# Patient Record
Sex: Female | Born: 1985 | Race: Black or African American | Hispanic: No | Marital: Single
Health system: Southern US, Community
[De-identification: ages and names within clinical notes are randomized; demographics above are authoritative.]

## PROBLEM LIST (undated history)

## (undated) ENCOUNTER — Ambulatory Visit

## (undated) DIAGNOSIS — F329 Major depressive disorder, single episode, unspecified: Secondary | ICD-10-CM

## (undated) DIAGNOSIS — A5403 Gonococcal cervicitis, unspecified: Secondary | ICD-10-CM

## (undated) DIAGNOSIS — F32A Depression, unspecified: Secondary | ICD-10-CM

## (undated) DIAGNOSIS — I10 Essential (primary) hypertension: Secondary | ICD-10-CM

## (undated) DIAGNOSIS — L309 Dermatitis, unspecified: Secondary | ICD-10-CM

## (undated) DIAGNOSIS — N3281 Overactive bladder: Secondary | ICD-10-CM

## (undated) DIAGNOSIS — R896 Abnormal cytological findings in specimens from other organs, systems and tissues: Secondary | ICD-10-CM

## (undated) DIAGNOSIS — IMO0002 Reserved for concepts with insufficient information to code with codable children: Secondary | ICD-10-CM

## (undated) DIAGNOSIS — F200 Paranoid schizophrenia: Secondary | ICD-10-CM

## (undated) DIAGNOSIS — F988 Other specified behavioral and emotional disorders with onset usually occurring in childhood and adolescence: Secondary | ICD-10-CM

## (undated) DIAGNOSIS — R87619 Unspecified abnormal cytological findings in specimens from cervix uteri: Secondary | ICD-10-CM

## (undated) DIAGNOSIS — F17200 Nicotine dependence, unspecified, uncomplicated: Secondary | ICD-10-CM

## (undated) DIAGNOSIS — J45909 Unspecified asthma, uncomplicated: Secondary | ICD-10-CM

## (undated) DIAGNOSIS — A630 Anogenital (venereal) warts: Secondary | ICD-10-CM

## (undated) DIAGNOSIS — F209 Schizophrenia, unspecified: Secondary | ICD-10-CM

## (undated) DIAGNOSIS — F202 Catatonic schizophrenia: Secondary | ICD-10-CM

## (undated) HISTORY — DX: Depression, unspecified: F32.A

## (undated) HISTORY — DX: Abnormal cytological findings in specimens from other organs, systems and tissues: R89.6

## (undated) HISTORY — DX: Catatonic schizophrenia: F20.2

## (undated) HISTORY — DX: Other specified behavioral and emotional disorders with onset usually occurring in childhood and adolescence: F98.8

## (undated) HISTORY — DX: Gonococcal cervicitis, unspecified: A54.03

## (undated) HISTORY — DX: Nicotine dependence, unspecified, uncomplicated: F17.200

## (undated) HISTORY — DX: Paranoid schizophrenia: F20.0

## (undated) HISTORY — DX: Major depressive disorder, single episode, unspecified: F32.9

## (undated) HISTORY — PX: INCISION AND DRAINAGE: SHX5863

## (undated) HISTORY — DX: Anogenital (venereal) warts: A63.0

---
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
## 2006-06-30 DIAGNOSIS — F988 Other specified behavioral and emotional disorders with onset usually occurring in childhood and adolescence: Secondary | ICD-10-CM | POA: Insufficient documentation

## 2006-06-30 DIAGNOSIS — F339 Major depressive disorder, recurrent, unspecified: Secondary | ICD-10-CM | POA: Insufficient documentation

## 2006-06-30 DIAGNOSIS — L2089 Other atopic dermatitis: Secondary | ICD-10-CM

## 2006-06-30 HISTORY — DX: Other specified behavioral and emotional disorders with onset usually occurring in childhood and adolescence: F98.8

## 2006-12-13 ENCOUNTER — Encounter: Payer: Self-pay | Admitting: Family Medicine

## 2006-12-13 DIAGNOSIS — F202 Catatonic schizophrenia: Secondary | ICD-10-CM | POA: Insufficient documentation

## 2006-12-13 HISTORY — DX: Catatonic schizophrenia: F20.2

## 2006-12-16 ENCOUNTER — Telehealth: Payer: Self-pay | Admitting: *Deleted

## 2006-12-17 ENCOUNTER — Telehealth: Payer: Self-pay | Admitting: Family Medicine

## 2006-12-23 ENCOUNTER — Encounter: Payer: Self-pay | Admitting: Family Medicine

## 2006-12-23 LAB — CONVERTED CEMR LAB
Hemoglobin: 13.9 g/dL
MCV: 82.9 fL
Platelets: 382 10*3/uL
WBC: 7.5 10*3/uL

## 2006-12-27 ENCOUNTER — Telehealth: Payer: Self-pay | Admitting: *Deleted

## 2006-12-29 ENCOUNTER — Ambulatory Visit: Payer: Self-pay | Admitting: Family Medicine

## 2007-01-09 ENCOUNTER — Ambulatory Visit: Payer: Self-pay | Admitting: Family Medicine

## 2007-01-09 DIAGNOSIS — A5403 Gonococcal cervicitis, unspecified: Secondary | ICD-10-CM | POA: Insufficient documentation

## 2007-01-09 DIAGNOSIS — N739 Female pelvic inflammatory disease, unspecified: Secondary | ICD-10-CM | POA: Insufficient documentation

## 2007-01-09 DIAGNOSIS — A5901 Trichomonal vulvovaginitis: Secondary | ICD-10-CM

## 2007-01-09 HISTORY — DX: Gonococcal cervicitis, unspecified: A54.03

## 2007-01-09 LAB — CONVERTED CEMR LAB
BUN: 10 mg/dL (ref 6–23)
CO2: 22 meq/L (ref 19–32)
Calcium: 9.5 mg/dL (ref 8.4–10.5)
Chloride: 105 meq/L (ref 96–112)
Cholesterol: 189 mg/dL (ref 0–200)
Creatinine, Ser: 0.71 mg/dL (ref 0.40–1.20)
Glucose, Bld: 88 mg/dL (ref 70–99)
HDL: 51 mg/dL (ref >39)
LDL Cholesterol: 121 mg/dL — ABNORMAL HIGH (ref 0–99)
Potassium: 4.3 meq/L (ref 3.5–5.3)
Sodium: 140 meq/L (ref 135–145)
Total CHOL/HDL Ratio: 3.7
Triglycerides: 87 mg/dL (ref <150)
VLDL: 17 mg/dL (ref 0–40)

## 2007-01-16 ENCOUNTER — Encounter: Payer: Self-pay | Admitting: Family Medicine

## 2007-01-20 ENCOUNTER — Telehealth: Payer: Self-pay | Admitting: Family Medicine

## 2008-04-02 DIAGNOSIS — E119 Type 2 diabetes mellitus without complications: Secondary | ICD-10-CM

## 2008-04-02 LAB — CONVERTED CEMR LAB: Pap Smear: NORMAL

## 2008-11-19 ENCOUNTER — Encounter: Payer: Self-pay | Admitting: Family Medicine

## 2008-11-19 DIAGNOSIS — F200 Paranoid schizophrenia: Secondary | ICD-10-CM

## 2008-11-19 HISTORY — DX: Paranoid schizophrenia: F20.0

## 2009-02-08 ENCOUNTER — Encounter (INDEPENDENT_AMBULATORY_CARE_PROVIDER_SITE_OTHER): Payer: Self-pay | Admitting: *Deleted

## 2009-02-08 DIAGNOSIS — F17200 Nicotine dependence, unspecified, uncomplicated: Secondary | ICD-10-CM

## 2009-02-08 HISTORY — DX: Nicotine dependence, unspecified, uncomplicated: F17.200

## 2009-02-10 ENCOUNTER — Ambulatory Visit: Payer: Self-pay | Admitting: Family Medicine

## 2009-02-10 DIAGNOSIS — L732 Hidradenitis suppurativa: Secondary | ICD-10-CM | POA: Insufficient documentation

## 2009-02-10 DIAGNOSIS — E669 Obesity, unspecified: Secondary | ICD-10-CM | POA: Insufficient documentation

## 2009-02-10 LAB — CONVERTED CEMR LAB
ALT: 21 units/L (ref 0–35)
AST: 20 units/L (ref 0–37)
Albumin: 4.2 g/dL (ref 3.5–5.2)
Alkaline Phosphatase: 126 units/L — ABNORMAL HIGH (ref 39–117)
BUN: 7 mg/dL (ref 6–23)
CO2: 19 meq/L (ref 19–32)
Calcium: 9.1 mg/dL (ref 8.4–10.5)
Chlamydia, Swab/Urine, PCR: NEGATIVE
Chloride: 107 meq/L (ref 96–112)
Cholesterol: 166 mg/dL (ref 0–200)
Creatinine, Ser: 0.74 mg/dL (ref 0.40–1.20)
GC Probe Amp, Urine: NEGATIVE
Glucose, Bld: 103 mg/dL — ABNORMAL HIGH (ref 70–99)
HCT: 38.3 % (ref 36.0–46.0)
HDL: 37 mg/dL — ABNORMAL LOW (ref >39)
Hemoglobin: 12.9 g/dL (ref 12.0–15.0)
LDL Cholesterol: 111 mg/dL — ABNORMAL HIGH (ref 0–99)
MCHC: 33.7 g/dL (ref 30.0–36.0)
MCV: 83.8 fL (ref 78.0–100.0)
Platelets: 414 10*3/uL — ABNORMAL HIGH (ref 150–400)
Potassium: 4.3 meq/L (ref 3.5–5.3)
RBC: 4.57 M/uL (ref 3.87–5.11)
RDW: 15.4 % (ref 11.5–15.5)
Sodium: 140 meq/L (ref 135–145)
Total Bilirubin: 0.2 mg/dL — ABNORMAL LOW (ref 0.3–1.2)
Total CHOL/HDL Ratio: 4.5
Total Protein: 7.2 g/dL (ref 6.0–8.3)
Triglycerides: 89 mg/dL (ref <150)
VLDL: 18 mg/dL (ref 0–40)
WBC: 10 10*3/uL (ref 4.0–10.5)

## 2009-02-11 ENCOUNTER — Encounter: Payer: Self-pay | Admitting: Family Medicine

## 2009-02-12 ENCOUNTER — Encounter: Payer: Self-pay | Admitting: Family Medicine

## 2009-03-03 ENCOUNTER — Encounter: Payer: Self-pay | Admitting: Family Medicine

## 2009-03-17 ENCOUNTER — Encounter: Payer: Self-pay | Admitting: Family Medicine

## 2009-03-24 ENCOUNTER — Encounter: Payer: Self-pay | Admitting: Family Medicine

## 2009-03-25 ENCOUNTER — Encounter: Payer: Self-pay | Admitting: Family Medicine

## 2009-04-09 ENCOUNTER — Encounter: Payer: Self-pay | Admitting: Family Medicine

## 2009-04-18 ENCOUNTER — Encounter: Payer: Self-pay | Admitting: Family Medicine

## 2009-05-06 ENCOUNTER — Encounter: Payer: Self-pay | Admitting: Family Medicine

## 2009-05-12 ENCOUNTER — Ambulatory Visit: Payer: Self-pay | Admitting: Family Medicine

## 2009-05-12 DIAGNOSIS — A630 Anogenital (venereal) warts: Secondary | ICD-10-CM | POA: Insufficient documentation

## 2009-05-12 HISTORY — DX: Anogenital (venereal) warts: A63.0

## 2009-05-12 LAB — CONVERTED CEMR LAB: Hgb A1c MFr Bld: 5.7 %

## 2009-06-16 ENCOUNTER — Telehealth: Payer: Self-pay | Admitting: Family Medicine

## 2009-06-18 ENCOUNTER — Telehealth: Payer: Self-pay | Admitting: Family Medicine

## 2009-07-25 ENCOUNTER — Telehealth: Payer: Self-pay | Admitting: Family Medicine

## 2009-08-14 ENCOUNTER — Encounter: Payer: Self-pay | Admitting: *Deleted

## 2009-08-14 ENCOUNTER — Telehealth: Payer: Self-pay | Admitting: *Deleted

## 2009-09-10 ENCOUNTER — Observation Stay (HOSPITAL_COMMUNITY): Admission: EM | Admit: 2009-09-10 | Discharge: 2009-09-10 | Payer: Self-pay | Admitting: Emergency Medicine

## 2010-01-02 ENCOUNTER — Emergency Department: Payer: Self-pay | Admitting: Emergency Medicine

## 2010-01-02 ENCOUNTER — Emergency Department: Payer: Self-pay | Admitting: Unknown Physician Specialty

## 2010-06-02 NOTE — Letter (Signed)
Summary: Termination of Care Letter  [facility]  [address]   [place], [place] [address]   Phone: [phone]  Fax: [phone]    08/14/2009 MRN: [id number]  Dear [name],  Due to your number of missed appointments, please be advised that the [facility] will no longer be your healthcare provider.  Accordingly, all further requests for appointments and office visits will be declined.  As such, you are not allowed on the premises of the [facility].  Should you have another physician who will take over your care, please advise [place] and we will forward a copy of your medical records.  If you do not know of another physician, we will assist you with a referral.  The termination of your relationship with this office is effective immediately.  In the next 30 days if you find that you have a need for medical care we will refer you to another provider for that care.  Thank you for your cooperation.  Sincerely,  D[place] Nancy RN  [name]   Appended Document: Termination of Care Letter Letter returned unable to forward.

## 2010-06-02 NOTE — Progress Notes (Signed)
Summary: phn msg  Phone Note Call from Patient Call back at [phone]   Caller: Patient Summary of Call: pt called to ask what time her appt is and was told 10:00.  "I thought it was at 4."  I told her no, it is at 10.  "well, I just woke up and can't make it by then.  I ride the bus. Can I resch?"  I explained that she has already rec'd a Suspension letter and she has already missed 2 appts after that and it is possible that she will be dismissed.  I would not be able to resch and she wanted me to ask the nurse if she can come later, she would get her mom to bring her.  pls advise. Initial call taken by: De Nurse,  August 14, 2009 9:49 AM  Follow-up for Phone Call        I discussed this patient with Dr. McDiarmid and the decision was made to dismiss her.  She has had 2 no shows since receiving her Suspeniosn Status letter.  Today's visit makes 3 no shows since the letter.   Letter to be mailed today. Follow-up by: [place] Nancy RN,  August 14, 2009 3:11 PM

## 2010-06-02 NOTE — Progress Notes (Signed)
Summary: TRIAGE  Phone Note Refill Request Call back at [phone] Message from:  Patient  Refills Requested: Medication #1:  LORAZEPAM 1 MG TABS 1 tablet by mouth at bedtime  Medication #2:  BENZTROPINE MESYLATE 1 MG TABS One tablet by mouth at bedtime to treat side effects of Fluphenazine PT USES [facility].  PHARMACY SAID SHE COULD NOT GET THEM BECAUSE IT WAS TOO SOON.  DOES SHE NEED TO COME IN TO SEE DR. Jessia Kief ABOUT THIS.  Initial call taken by: Clydell Hakim,  June 16, 2009 10:31 AM  Follow-up for Phone Call        to pcp Follow-up by: Golden Circle RN,  June 16, 2009 10:35 AM  Additional Follow-up for Phone Call Additional follow up Details #1::        Please let patient know she is to get her Lorazepam and Benztropine from [facility]. Additional Follow-up by: Tawanna Cooler Zophia Marrone MD,  June 16, 2009 5:05 PM    Additional Follow-up for Phone Call Additional follow up Details #2::    left message Follow-up by: Golden Circle RN,  June 17, 2009 8:52 AM  Additional Follow-up for Phone Call Additional follow up Details #3:: Details for Additional Follow-up Action Taken: spoke with her mom who told me to call [phone]. a man answered. spoke with pt who states she just tried to get them here. she has not called the [facility] center yet. advised her to call as she is out Additional Follow-up by: Golden Circle RN,  June 17, 2009 1:56 PM

## 2010-06-02 NOTE — Progress Notes (Signed)
----   Converted from flag ---- ---- 06/17/2009 4:40 PM, Wyona Almas PHD wrote: Tawanna Cooler,  Just wanted to let you know that Ms. Kopf [facility] her appt w/ me this afternoon.  Jeannie ------------------------------

## 2010-06-02 NOTE — Letter (Signed)
Summary: Suspension Letter  [facility]  [address]   [place], [place] [address]   Phone: [phone]  Fax: [phone]    05/06/2009  Molly [name] [address] [place], [place]  [address]  Dear [name],  You have missed 4 scheduled appointments with our practice.If you cannot keep your appointment, we expect you to call and cancel at least 24 hours before your appointment time.  As per our policy, we will now only give you limited medical services. means we will not call in a refill for you, or complete a form or make a referral except when you are here for a scheduled office visit.   If you miss 2 more appointments in the next year, we will dismiss you from our practice.  We hope this does not happen.  If you keep your appointments for the next year you will be returned to regular patient status.  We hope these changes will encourage you to keep your appointments so we may provide you the best medical care.   Our office staff can be reached at [phone] Monday through Friday from 8:30 a.m.-5:00 p.m. and will be glad to schedule your appointment as necessary.     Sincerely,   [facility]    Appended Document: Suspension Letter mailed.
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
## 2010-06-02 NOTE — Progress Notes (Signed)
Summary: triage  Phone Note Call from Patient Call back at [phone]   Caller: Patient Summary of Call: pt has a cyst on stomach and wants to know what she can put on it. Initial call taken by: De Nurse,  July 25, 2009 4:42 PM  Follow-up for Phone Call        lower midline area below umbilicus. it is draining pinkish . states it has been there for over a year off & on. says md has seen her for this. appt monday to be checked. until then may use warm ,wet compresses & apply triple antibiotic ointment to area. go to UC if worse over weekend. states her sisters have same Follow-up by: Golden Circle RN,  July 25, 2009 4:43 PM
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
## 2011-03-23 DIAGNOSIS — F29 Unspecified psychosis not due to a substance or known physiological condition: Secondary | ICD-10-CM | POA: Diagnosis not present

## 2011-03-23 DIAGNOSIS — F259 Schizoaffective disorder, unspecified: Secondary | ICD-10-CM | POA: Diagnosis not present

## 2011-06-06 ENCOUNTER — Emergency Department (HOSPITAL_COMMUNITY)
Admission: EM | Admit: 2011-06-06 | Discharge: 2011-06-17 | Disposition: A | Discharge status: Other Acute Inpatient Hospital | Payer: Self-pay | Attending: Family Medicine | Admitting: Family Medicine

## 2011-06-06 ENCOUNTER — Encounter (HOSPITAL_COMMUNITY): Payer: Self-pay | Admitting: *Deleted

## 2011-06-06 DIAGNOSIS — F209 Schizophrenia, unspecified: Secondary | ICD-10-CM | POA: Insufficient documentation

## 2011-06-06 HISTORY — DX: Schizophrenia, unspecified: F20.9

## 2011-06-06 LAB — RAPID URINE DRUG SCREEN, HOSP PERFORMED
Opiates: NOT DETECTED
Tetrahydrocannabinol: NOT DETECTED

## 2011-06-06 LAB — CBC
Hemoglobin: 12.4 g/dL (ref 12.0–15.0)
MCHC: 34.5 g/dL (ref 30.0–36.0)
RDW: 14.7 % (ref 11.5–15.5)

## 2011-06-06 LAB — BASIC METABOLIC PANEL
Chloride: 100 mEq/L (ref 96–112)
GFR calc Af Amer: >90 mL/min (ref >90)
Potassium: 3.5 mEq/L (ref 3.5–5.1)

## 2011-06-06 LAB — URINALYSIS, MICROSCOPIC ONLY
Nitrite: NEGATIVE
Specific Gravity, Urine: 1.004 — ABNORMAL LOW (ref 1.005–1.030)
pH: 6.5 (ref 5.0–8.0)

## 2011-06-06 LAB — DIFFERENTIAL
Basophils Absolute: 0.1 10*3/uL (ref 0.0–0.1)
Basophils Relative: 1 % (ref 0–1)
Neutro Abs: 5.5 10*3/uL (ref 1.7–7.7)
Neutrophils Relative %: 56 % (ref 43–77)

## 2011-06-06 LAB — POCT PREGNANCY, URINE: Preg Test, Ur: NEGATIVE

## 2011-06-06 MED ORDER — IBUPROFEN 600 MG PO TABS: 600.0000 mg | ORAL_TABLET | Freq: Three times a day (TID) | ORAL | Status: DC | PRN

## 2011-06-06 MED ORDER — ALUM & MAG HYDROXIDE-SIMETH 200-200-20 MG/5ML PO SUSP: 30.0000 mL | ORAL | Status: DC | PRN

## 2011-06-06 MED ORDER — ACETAMINOPHEN 325 MG PO TABS
650.0000 mg | ORAL_TABLET | ORAL | Status: DC | PRN
  Administered 2011-06-09: 650 mg via ORAL
  Filled 2011-06-06: qty 2

## 2011-06-06 MED ORDER — BENZTROPINE MESYLATE 1 MG PO TABS
2.0000 mg | ORAL_TABLET | Freq: Two times a day (BID) | ORAL | Status: DC
  Administered 2011-06-06 – 2011-06-07 (×2): 2 mg via ORAL
  Administered 2011-06-07 (×2): 1 mg via ORAL
  Administered 2011-06-08 – 2011-06-11 (×7): 2 mg via ORAL
  Administered 2011-06-12: 1 mg via ORAL
  Administered 2011-06-12 – 2011-06-17 (×10): 2 mg via ORAL
  Filled 2011-06-06 (×4): qty 2
  Filled 2011-06-06: qty 1
  Filled 2011-06-06 (×6): qty 2
  Filled 2011-06-06: qty 1
  Filled 2011-06-06 (×5): qty 2
  Filled 2011-06-06 (×2): qty 1
  Filled 2011-06-06 (×3): qty 2
  Filled 2011-06-06: qty 1

## 2011-06-06 MED ORDER — BENZTROPINE MESYLATE 1 MG/ML IJ SOLN: 2.0000 mg | Freq: Two times a day (BID) | INTRAMUSCULAR | Status: DC

## 2011-06-06 MED ORDER — FLUPHENAZINE HCL 5 MG PO TABS
5.0000 mg | ORAL_TABLET | Freq: Two times a day (BID) | ORAL | Status: DC
  Administered 2011-06-06 – 2011-06-17 (×22): 5 mg via ORAL
  Filled 2011-06-06 (×29): qty 1

## 2011-06-06 MED ORDER — DIVALPROEX SODIUM 500 MG PO DR TAB
500.0000 mg | DELAYED_RELEASE_TABLET | Freq: Two times a day (BID) | ORAL | Status: DC
  Administered 2011-06-06 – 2011-06-17 (×21): 500 mg via ORAL
  Filled 2011-06-06 (×22): qty 1

## 2011-06-06 MED ORDER — LORAZEPAM 1 MG PO TABS
1.0000 mg | ORAL_TABLET | Freq: Three times a day (TID) | ORAL | Status: DC | PRN
  Administered 2011-06-06 – 2011-06-13 (×8): 1 mg via ORAL
  Filled 2011-06-06 (×8): qty 1

## 2011-06-06 MED ORDER — NICOTINE 21 MG/24HR TD PT24
21.0000 mg | MEDICATED_PATCH | Freq: Every day | TRANSDERMAL | Status: DC
  Administered 2011-06-07 – 2011-06-11 (×4): 21 mg via TRANSDERMAL
  Filled 2011-06-06 (×6): qty 1

## 2011-06-06 MED ORDER — ONDANSETRON HCL 4 MG PO TABS: 4.0000 mg | ORAL_TABLET | Freq: Three times a day (TID) | ORAL | Status: DC | PRN

## 2011-06-06 MED ORDER — ZOLPIDEM TARTRATE 5 MG PO TABS
5.0000 mg | ORAL_TABLET | Freq: Every evening | ORAL | Status: DC | PRN
  Administered 2011-06-06 – 2011-06-16 (×7): 5 mg via ORAL
  Filled 2011-06-06 (×7): qty 1

## 2011-06-06 NOTE — ED Notes (Signed)
eatting supper 

## 2011-06-06 NOTE — ED Notes (Signed)
Security and [employer] had to physically assist pt to remove jewelry, hats, sunglasses and bra. Bandaid applied over stud in top, left lip due to inability to remove it, pt reports that it is an implant, Wille Celeste, RN aware in Psych ED. Pt escorted to Psych ED at present accompanied by [employer] and Security, condition stable at this time.

## 2011-06-06 NOTE — BH Assessment (Signed)
  Writer informed EDP of telepsych eval recommendation for inpatient treatment. Pt's RN also notified.

## 2011-06-06 NOTE — ED Notes (Signed)
Security in to wand pt and pt's personal belongings, belongings placed at nurses' station.
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
## 2011-06-06 NOTE — ED Notes (Signed)
telepsych in progress 

## 2011-06-06 NOTE — ED Notes (Signed)
MD (Dr. Preston Fleeting) at bedside.

## 2011-06-06 NOTE — ED Notes (Signed)
telepsych information faxed

## 2011-06-06 NOTE — ED Notes (Signed)
Spoke with Dr. Preston Fleeting to clarify Cogentin order that was entered as injection. MD to change order to PO.
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
## 2011-06-06 NOTE — ED Notes (Signed)
Pt assisted to BR for urine sample but became upset and argumentative when asked to change into blue scrubs and take jewelry off, [employer] present and pt instructed to change, will monitor.

## 2011-06-07 ENCOUNTER — Encounter (HOSPITAL_COMMUNITY): Payer: Self-pay | Admitting: Emergency Medicine

## 2011-06-07 NOTE — BH Assessment (Signed)
First opinion for IVC completed, signed by EDP.

## 2011-06-07 NOTE — ED Notes (Signed)
Told patient breakfast has been ordered--patient asked "who is cooking?" "Is it a man or woman?" Talked with patient regarding dietary, no further questions.
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
## 2011-06-07 NOTE — ED Notes (Signed)
Came to hallway to ask for a pregnancy test. States " when i was at the other hospital - the baby came half way out" also states she has an IUD in. Does not remember when her last period was. Wanted nurse to feel abd to feel "the baby".

## 2011-06-08 MED ORDER — ZIPRASIDONE MESYLATE 20 MG IM SOLR
20.0000 mg | Freq: Two times a day (BID) | INTRAMUSCULAR | Status: DC | PRN
  Administered 2011-06-08: 20 mg via INTRAMUSCULAR
  Filled 2011-06-08: qty 20

## 2011-06-08 NOTE — Progress Notes (Signed)
CSW spoke with Cheryl at [facility] who confirmed patient is on the waiting list.  No additional information needed at this time.  Niza Soderholm Ann S Alahia Whicker , MSW, LCSWA 06/08/2011 2:03 PM [phone]   
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
## 2011-06-08 NOTE — ED Notes (Signed)
Pt. Given lunch tray, ate a few things off tray and threw bedside tray across room with meal tray on it.  Asked pt. What happened, pt. Sat on bed with head down, nonverbal.  Informed pt. That her bedside tray would be removed from room.  Cleaned flr/walls of food, removed all from room.

## 2011-06-08 NOTE — ED Notes (Signed)
Pt was in room and removed the sheets from her bed and began tearing them into strips. When staff entered the room, pt stated that she was about to have a baby and was just going to take care of it herself since we wouldn't help her deliver it. Pt reminded that her pregnancy test was negative, and that she was not going into labor. Pt took off all of her clothes and continued to tear linen. When staff tried to redirect her, pt became hostile and tried to push the door closed. Pt tries not to allow staff to remove wet and dirty linen from room, stating she needs it. She also tries to keep all of her old cups, napkins and food items. While discussing this with pt and trying to explain why we needed to clean up the room, pt began raising her voice, stating she was not going to stay here any longer, that she wanted to be discharged now, and that we couldn't keep her here. Pt was increasingly psychotic and agitated, and required Geodon 20mg  IM per per PRN order. After security responded to unit for assistance, RN was able to convince pt to take IM shot voluntarily, after talking to her > . Pt remains hostile and psychotic, and asks that staff leaves her alone. Pt continues to be watched closely on camera to ensure her safety. Pt denies pain and is safe at this time.

## 2011-06-08 NOTE — ED Notes (Signed)
Pt. Ate dinner without throwing tray.

## 2011-06-08 NOTE — ED Notes (Signed)
Pt comes to the nurse's station repeatedly asking to leave. She states that when she got here, she was told she would only be here for a "couple of hours" and then would get to go home. Pt reminded that she is here for stabilization on meds and that that will take more time. Pt insisting on speaking to the EDP, but finally states she will wait until morning. Pt with flat affect and low tone of voice.

## 2011-06-08 NOTE — ED Notes (Signed)
Pt. Sitting in bed watching tv. 

## 2011-06-08 NOTE — ED Notes (Signed)
Patient washed but changed into old scrubs. When shown new scrubs to change into patient started laughing.

## 2011-06-08 NOTE — ED Notes (Signed)
Pt displaying unusual behavior, such as urinating in her bed, tearing her paper scrubs off and sitting naked in her bed. At times she sits with her heels together and knees pointed out, intermittently looking down at her vagina. At other times, she wets a cloth with her drinking water and places it over her vagina. When RN goes into pt room to bring replacement scrubs and new sheets, pt states she has to see the doctor because she is having contractions and needs to walk. When RN reminds her that her urine preg test was neg, pt states she doesn't believe that, and states that she knows she is pregnant and in labor. Pt states, "That's why I have to get out of here. What am I going to do if the baby pops out, leave it at home with my mama? While I'm stuck in here? NO." Pt is compliant with PO meds, although wary of taking anything staff gives her. Pt mixes her food that she has been given all together, putting her sandwich in her applesauce and pouring some milk over it. When RN offers to throw it away and get new food, pt refuses, stating she likes it that way. Pt must have all old cups removed from the room, as she was seen on the camera putting things between her legs (torn styrofoam). Pt must be constantly reassured and convinced to change her bedding and clothes, and to throw old things away. Pt is cooperative but irritated ans sometimes hostile towards staff.

## 2011-06-08 NOTE — ED Notes (Signed)
Pt. Came to hall in doorway without pants on, asked pt. To step back in room and put her pants back on, pt. Complied.  Pt. Redirectable, flat affect.

## 2011-06-08 NOTE — ED Notes (Signed)
Per pt. Request, given extra blanket. 
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
## 2011-06-09 NOTE — ED Notes (Signed)
While cleaning up patients room this writer tried to take away 2 cups of water with paper towels stuffed into them. Patient started speaking in a loud voice stating "no don't take those, they're for the babies." When asked what babies pt replied "the ones in my stomach." This writer just agreed with patient.

## 2011-06-09 NOTE — ED Notes (Signed)
Pt comes to the nurse's desk and asks writer to come talk to her in her room. Upon entering room, pt is tearful and sad. She tells Clinical research associate to take the remaining pillowcase with linens inside out of her room, stating sadly that "If I'm not gonna have a baby, then I don't need all that stuff." Pt also asked for something to drink. When writer returned with some Sprite and some crackers, pt was very mannerly and polite, saying yes ma'am, no ma'am, please and thank you.
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
## 2011-06-10 NOTE — ED Notes (Signed)
Patient refusing to shower today. Encouraged patient to let me know if she changed her mind. Offered to let her shower after dinner. Patient stated would let me know.

## 2011-06-10 NOTE — ED Notes (Signed)
Patient is resting comfortably. 
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
## 2011-06-10 NOTE — ED Notes (Signed)
While taking vitals this writer asked patient if she would like to shower now. Patient agreed.

## 2011-06-11 NOTE — ED Notes (Signed)
Patient covered herself with powder from head to toe.  Was directed to shower.  Would not explain why she did it.  Complete linen change provided.

## 2011-06-11 NOTE — ED Provider Notes (Signed)
Pt seen and evaluated in psych ED.  She is eating crackers and voices no complaints.  She has had telepsych who recommends inpatient admission.  She is awaiting placement at [facility].  Ethelda Chick, MD 06/11/11 [phone]

## 2011-06-11 NOTE — ED Notes (Signed)
Pt information faxed to [facility]. 

## 2011-06-11 NOTE — ED Notes (Addendum)
Confirmed IVC paperwork is in compliance. Expires 06/13/11. Verified that the first exam/QPE was needed and in the chart. Made copies for each set of IVC.  TC with Sheryl @ [facility]. Confirmed pt is on the wait list, nothing needed at this time.

## 2011-06-11 NOTE — Progress Notes (Signed)
[facility] 06/09/11  Facilitated BH coping skills group for pt's in [facility] ED. Group focused on how group members view themselves, others, and the world and incorporated Adlerian theory to facilitate increased social interest and healthy fx. Group was active and engaged, members collaborated to share stories of their struggles with depression/substance use and what their plan is in moving forward after d/c.  Pt was engaged in group, appeared to alternate between moments of lucidity and possible bizarre mentation. Pt's bx was difficult to distinguish between loose associations/delusional content and possible malingering (e.g., pt would state "This is [place] and [place] and if you don't want to be hear you need to move on," would stare at counselor and other group members, then laugh). Pt was able to engage in the group activity and related briefly to one other group member. Pt was mostly quiet until invited to speak.  Molly [name] B MS, LPCA, NCC

## 2011-06-11 NOTE — ED Notes (Addendum)
[facility] and spoke with [place], pt assessed for admission. Pt requires sponsorship and because she lives in [facility], she does not meet criteria for admission.
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
## 2011-06-14 NOTE — ED Notes (Signed)
Pt has been in room sleeping/watching television at intervals, singing at times, pleasant and cooperative when in her presence or communicating her needs, however difficult to engage in conversation, appears to possibly be thought blocking.

## 2011-06-14 NOTE — ED Notes (Signed)
Writer completed second IVC affidavit (7 days have passed since original IVC) and faxed to [employer]

## 2011-06-14 NOTE — Discharge Planning (Signed)
CSW spoke with Junious Dresser who confirmed that patient is on the waiting list.  No additional information needed at this time.  Ileene Hutchinson , MSW, LCSWA 06/14/2011 1:22 PM [phone]

## 2011-06-15 NOTE — ED Notes (Signed)
Up to the desk, on the phone

## 2011-06-15 NOTE — BHH Counselor (Signed)
Confirmed on [facility] list [facility]   Assessment updated today 06/15/11.
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
## 2011-06-15 NOTE — ED Notes (Signed)
Care assumed

## 2011-06-15 NOTE — Discharge Planning (Signed)
CSW spoke with Leotis Shames at [facility] who confirmed patient remains on waiting list.  No additional information needed at this time.  Ileene Hutchinson , MSW, LCSWA 06/15/2011 7:37 AM [phone]

## 2011-06-15 NOTE — ED Notes (Signed)
Pt has been in room sleeping mostly out to communicate needs/use the restroom, flat affect, quiet speech, difficult to engage in conversation only answers yes/no or shakes head when asked questions. No bizarre behaviors observed thus far. Continuing to monitor pt's behavior.

## 2011-06-16 NOTE — ED Provider Notes (Signed)
Filed Vitals:   06/16/11 0539  BP: 108/75  Pulse: 84  Temp: 98.6 F (37 C)  Resp: 18    Patient continues to await placement at [facility]. She is hemodynamically stable. No active issues at this time.  Cyndra Numbers, MD 06/16/11 [phone]
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
## 2011-06-17 DIAGNOSIS — E669 Obesity, unspecified: Secondary | ICD-10-CM | POA: Diagnosis not present

## 2011-06-17 DIAGNOSIS — F259 Schizoaffective disorder, unspecified: Secondary | ICD-10-CM | POA: Diagnosis not present

## 2011-06-17 NOTE — ED Notes (Signed)
Offered shower. Patient refusing. Offered option to shower after lunch.

## 2011-06-17 NOTE — ED Provider Notes (Signed)
Patient has been accepted at [facility] and will be transferred there.  Dione Booze, MD 06/17/11 1416

## 2011-06-17 NOTE — ED Provider Notes (Signed)
She is resting comfortably and has no complaints regarding her psychiatric condition. She is complaining that she is not able to remove the stud from her lower lip. I have evaluated her and the mucosal part of the stud has worked its way through the mucosa and is no longer accessible without making an incision. I have informed her of this.  Dione Booze, MD 06/17/11 [phone]

## 2011-06-17 NOTE — Discharge Planning (Addendum)
Connie with [facility] called to request vitals, MAR, and IVC papers. Will call back with bed assignment.  Ileene Hutchinson , MSW, LCSWA 06/17/2011 2:01 PM [phone]  Junious Dresser called and advised patient has been accepted to [facility]. Patient will be transported by [facility]. EDP and patient's nurse notified of disposition.  Ileene Hutchinson , MSW, LCSWA 06/17/2011  2:06 PM [phone]
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
## 2011-08-02 DIAGNOSIS — F200 Paranoid schizophrenia: Secondary | ICD-10-CM | POA: Diagnosis not present

## 2011-08-24 ENCOUNTER — Inpatient Hospital Stay: Payer: Self-pay | Admitting: Surgery

## 2011-08-24 DIAGNOSIS — L0501 Pilonidal cyst with abscess: Secondary | ICD-10-CM | POA: Diagnosis not present

## 2011-08-24 DIAGNOSIS — E669 Obesity, unspecified: Secondary | ICD-10-CM | POA: Diagnosis present

## 2011-08-24 DIAGNOSIS — F205 Residual schizophrenia: Secondary | ICD-10-CM | POA: Diagnosis present

## 2011-08-24 DIAGNOSIS — L0291 Cutaneous abscess, unspecified: Secondary | ICD-10-CM | POA: Diagnosis not present

## 2011-08-24 DIAGNOSIS — E119 Type 2 diabetes mellitus without complications: Secondary | ICD-10-CM | POA: Diagnosis not present

## 2011-08-24 DIAGNOSIS — F17200 Nicotine dependence, unspecified, uncomplicated: Secondary | ICD-10-CM | POA: Diagnosis present

## 2011-08-24 LAB — CBC WITH DIFFERENTIAL/PLATELET
Bands: 4 %
Basophil %: 0.2 %
Eosinophil #: 0 10*3/uL (ref 0.0–0.7)
HCT: 35.7 % (ref 35.0–47.0)
Lymphocyte #: 2.5 10*3/uL (ref 1.0–3.6)
Lymphocyte %: 11.2 %
Lymphocytes: 15 %
MCH: 30.3 pg (ref 26.0–34.0)
MCV: 91 fL (ref 80–100)
Monocyte #: 3.3 x10 3/mm — ABNORMAL HIGH (ref 0.2–0.9)
Monocyte %: 14.5 %
Monocytes: 12 %
Myelocyte: 2 %
Neutrophil #: 16.8 10*3/uL — ABNORMAL HIGH (ref 1.4–6.5)
RBC: 3.95 10*6/uL (ref 3.80–5.20)
Segmented Neutrophils: 67 %
WBC: 22.7 10*3/uL — ABNORMAL HIGH (ref 3.6–11.0)

## 2011-08-24 LAB — COMPREHENSIVE METABOLIC PANEL
Albumin: 2.9 g/dL — ABNORMAL LOW (ref 3.4–5.0)
Alkaline Phosphatase: 87 U/L (ref 50–136)
Anion Gap: 10 (ref 7–16)
BUN: 9 mg/dL (ref 7–18)
Bilirubin,Total: 0.3 mg/dL (ref 0.2–1.0)
Calcium, Total: 8.3 mg/dL — ABNORMAL LOW (ref 8.5–10.1)
Chloride: 103 mmol/L (ref 98–107)
Co2: 25 mmol/L (ref 21–32)
EGFR (African American): >60
EGFR (Non-African Amer.): >60
Glucose: 92 mg/dL (ref 65–99)
Osmolality: 274 (ref 275–301)
Potassium: 3.9 mmol/L (ref 3.5–5.1)
SGOT(AST): 32 U/L (ref 15–37)
Sodium: 138 mmol/L (ref 136–145)

## 2011-08-25 DIAGNOSIS — L0501 Pilonidal cyst with abscess: Secondary | ICD-10-CM | POA: Diagnosis not present

## 2011-08-25 LAB — BASIC METABOLIC PANEL
Anion Gap: 8 (ref 7–16)
BUN: 8 mg/dL (ref 7–18)
Calcium, Total: 7.9 mg/dL — ABNORMAL LOW (ref 8.5–10.1)
Chloride: 105 mmol/L (ref 98–107)
Co2: 25 mmol/L (ref 21–32)
Creatinine: 0.75 mg/dL (ref 0.60–1.30)
EGFR (Non-African Amer.): >60
Glucose: 107 mg/dL — ABNORMAL HIGH (ref 65–99)
Potassium: 3.6 mmol/L (ref 3.5–5.1)
Sodium: 138 mmol/L (ref 136–145)

## 2011-08-25 LAB — CBC WITH DIFFERENTIAL/PLATELET
Basophil: 1 %
Eosinophil: 0 %
HCT: 31 % — ABNORMAL LOW (ref 35.0–47.0)
HGB: 10.3 g/dL — ABNORMAL LOW (ref 12.0–16.0)
Lymphocytes: 16 %
MCH: 29.7 pg (ref 26.0–34.0)
MCV: 89 fL (ref 80–100)
Monocytes: 8 %
Segmented Neutrophils: 70 %
WBC: 20.4 10*3/uL — ABNORMAL HIGH (ref 3.6–11.0)

## 2011-09-09 DIAGNOSIS — F200 Paranoid schizophrenia: Secondary | ICD-10-CM | POA: Diagnosis not present

## 2011-10-04 DIAGNOSIS — F259 Schizoaffective disorder, unspecified: Secondary | ICD-10-CM | POA: Diagnosis not present

## 2011-10-19 DIAGNOSIS — N390 Urinary tract infection, site not specified: Secondary | ICD-10-CM | POA: Diagnosis not present

## 2011-10-19 DIAGNOSIS — Z136 Encounter for screening for cardiovascular disorders: Secondary | ICD-10-CM | POA: Diagnosis not present

## 2011-10-19 DIAGNOSIS — Z3202 Encounter for pregnancy test, result negative: Secondary | ICD-10-CM | POA: Diagnosis not present

## 2011-10-19 DIAGNOSIS — N926 Irregular menstruation, unspecified: Secondary | ICD-10-CM | POA: Diagnosis not present

## 2011-10-26 DIAGNOSIS — F259 Schizoaffective disorder, unspecified: Secondary | ICD-10-CM | POA: Diagnosis not present

## 2011-11-18 DIAGNOSIS — Z309 Encounter for contraceptive management, unspecified: Secondary | ICD-10-CM | POA: Diagnosis not present

## 2011-11-18 DIAGNOSIS — F259 Schizoaffective disorder, unspecified: Secondary | ICD-10-CM | POA: Diagnosis not present

## 2011-12-06 ENCOUNTER — Emergency Department (HOSPITAL_COMMUNITY)
Admission: EM | Admit: 2011-12-06 | Discharge: 2011-12-06 | Disposition: A | Discharge status: Home / Self Care | Payer: Self-pay | Attending: Emergency Medicine | Admitting: Emergency Medicine

## 2011-12-06 ENCOUNTER — Encounter (HOSPITAL_COMMUNITY): Payer: Self-pay | Admitting: Emergency Medicine

## 2011-12-06 DIAGNOSIS — Z008 Encounter for other general examination: Secondary | ICD-10-CM | POA: Insufficient documentation

## 2011-12-06 DIAGNOSIS — F209 Schizophrenia, unspecified: Secondary | ICD-10-CM | POA: Insufficient documentation

## 2011-12-06 NOTE — ED Notes (Signed)
Sitter at bedside.  Pt attempting to leave.  [employer] at side and stated they cannot hold pt if she is not si or hi.  Pt denies hi si.  Attempted to call group home.  Contact # is cell # for someone else.  Spoke with AD Jae Dire and she states we cannot legally hold pt.  Pt states she is calling so to pick her up.

## 2011-12-06 NOTE — ED Notes (Signed)
Pt is from a group home and was sent here because she was not acting right and appears paranoid.  Pt has history of schizophrenia and is denying seeing things or hearing voices.  Pt denies SI/HI.  Pt responds to questions but does not make eye contact.  Pt is pacing back and forth.  No history of seizures.  Will attempt to place in blue scrubs and get wanded.

## 2011-12-06 NOTE — ED Notes (Signed)
Pt not acting right.  Attempting to get patient to change into blue scrubs and notified [facility] for sitter need

## 2011-12-07 DIAGNOSIS — F259 Schizoaffective disorder, unspecified: Secondary | ICD-10-CM | POA: Diagnosis not present

## 2012-01-20 DIAGNOSIS — F259 Schizoaffective disorder, unspecified: Secondary | ICD-10-CM | POA: Diagnosis not present

## 2012-01-31 ENCOUNTER — Other Ambulatory Visit (HOSPITAL_COMMUNITY)
Admission: RE | Admit: 2012-01-31 | Discharge: 2012-01-31 | Disposition: A | Discharge status: Home / Self Care | Payer: Medicare Other | Source: Ambulatory Visit | Attending: Family Medicine | Admitting: Family Medicine

## 2012-01-31 ENCOUNTER — Encounter: Payer: Self-pay | Admitting: Family Medicine

## 2012-01-31 ENCOUNTER — Ambulatory Visit (INDEPENDENT_AMBULATORY_CARE_PROVIDER_SITE_OTHER): Payer: Medicare Other | Admitting: Family Medicine

## 2012-01-31 VITALS — BP 115/72 | HR 92 | Temp 98.5°F | Wt 273.0 lb

## 2012-01-31 DIAGNOSIS — Z1151 Encounter for screening for human papillomavirus (HPV): Secondary | ICD-10-CM | POA: Insufficient documentation

## 2012-01-31 DIAGNOSIS — B373 Candidiasis of vulva and vagina: Secondary | ICD-10-CM

## 2012-01-31 DIAGNOSIS — E119 Type 2 diabetes mellitus without complications: Secondary | ICD-10-CM

## 2012-01-31 DIAGNOSIS — A5901 Trichomonal vulvovaginitis: Secondary | ICD-10-CM

## 2012-01-31 DIAGNOSIS — R87810 Cervical high risk human papillomavirus (HPV) DNA test positive: Secondary | ICD-10-CM | POA: Insufficient documentation

## 2012-01-31 DIAGNOSIS — Z124 Encounter for screening for malignant neoplasm of cervix: Secondary | ICD-10-CM | POA: Insufficient documentation

## 2012-01-31 DIAGNOSIS — F200 Paranoid schizophrenia: Secondary | ICD-10-CM

## 2012-01-31 DIAGNOSIS — N760 Acute vaginitis: Secondary | ICD-10-CM | POA: Diagnosis not present

## 2012-01-31 DIAGNOSIS — Z113 Encounter for screening for infections with a predominantly sexual mode of transmission: Secondary | ICD-10-CM | POA: Diagnosis not present

## 2012-01-31 DIAGNOSIS — N912 Amenorrhea, unspecified: Secondary | ICD-10-CM | POA: Diagnosis not present

## 2012-01-31 DIAGNOSIS — T8339XA Other mechanical complication of intrauterine contraceptive device, initial encounter: Secondary | ICD-10-CM

## 2012-01-31 LAB — POCT URINE PREGNANCY: Preg Test, Ur: NEGATIVE

## 2012-01-31 LAB — POCT WET PREP (WET MOUNT)

## 2012-01-31 LAB — POCT GLYCOSYLATED HEMOGLOBIN (HGB A1C): Hemoglobin A1C: 5.6

## 2012-01-31 NOTE — Patient Instructions (Addendum)
Your blood sugar is good.  Your Mirena has become scarred down in your uterus. We will set up an appointment with Gynecology at [facility] to have the Mirena removed.   We will

## 2012-02-01 ENCOUNTER — Encounter: Payer: Self-pay | Admitting: Obstetrics & Gynecology

## 2012-02-01 ENCOUNTER — Encounter: Payer: Self-pay | Admitting: Family Medicine

## 2012-02-01 DIAGNOSIS — Z124 Encounter for screening for malignant neoplasm of cervix: Secondary | ICD-10-CM | POA: Insufficient documentation

## 2012-02-01 DIAGNOSIS — T8339XA Other mechanical complication of intrauterine contraceptive device, initial encounter: Secondary | ICD-10-CM | POA: Insufficient documentation

## 2012-02-01 DIAGNOSIS — B373 Candidiasis of vulva and vagina: Secondary | ICD-10-CM | POA: Insufficient documentation

## 2012-02-01 MED ORDER — METRONIDAZOLE 500 MG PO TABS: 2000.0000 mg | ORAL_TABLET | Freq: Once | ORAL | Status: DC

## 2012-02-01 MED ORDER — FLUCONAZOLE 150 MG PO TABS: 150.0000 mg | ORAL_TABLET | Freq: Once | ORAL | Status: DC

## 2012-02-01 NOTE — Assessment & Plan Note (Signed)
Pt denies sexual intercourse for over two years. Encouraged patient to inform any sexual partners within last year. Abstinence until both her and her partner treated plus 7 days.   Rx Flagyl 2 g oral once. Recheck Wet prep in 3 months to check for re-infection

## 2012-02-01 NOTE — Assessment & Plan Note (Signed)
Plan: diflucan 150 mg once oral

## 2012-02-01 NOTE — Assessment & Plan Note (Signed)
Lab Results  Component Value Date   HGBA1C 5.6 01/31/2012   Adequate glycemic control.   No new end-organ damage. No new interventions.

## 2012-02-01 NOTE — Progress Notes (Signed)
  Subjective:    Patient ID: Molly [name], female    DOB: 09-Dec-1985, 26 y.o.   MRN: [id number]  HPI  [facility] presents to re-establish care at [facility].  Pt is unaccompanied.   [name] is vague in relating recent history, but best I can discern, she has had fairly recent psychiatric hospitalization(s).  She is currently living in a half-way home in [place]. Her psychiatric medications are managed by [facility].  She reports being compliant with her prescribed psychiatric medications.  She is not in school nor working.  She is interested in returning to school to pursue a business degress.   IUD Pt requesting removal of Mirena IUD.  She believes it has been in for at least four years, but I do not have documentation of when it was placed. She is chronically amenorrheic.   She reports being not sexually active for at least 2 years.  (+) vaginal white discharge without pain or itching.  Her child is four years old.  She is not interested in replacement Mirena nor other non-barrier forms of contraception.  CHRONIC DIABETES  Disease Monitoring  Blood Sugar Ranges: not checking  Polyuria: no   Visual problems: no   Medication Compliance: On no antidiabetic medication  Medication Side Effects  Hypoglycemia: no   Preventitive Health Care  Eye Exam: none  Foot Exam: no  Diet pattern: no  Exercise: none   SH: Smoking (+).  Living in [place] home in .   Review of Systems See HPI     Objective:   Physical Exam  Nursing note and vitals reviewed. Constitutional: She is cooperative. No distress.       Morbid obesity  HENT:  Right Ear: Tympanic membrane and ear canal normal.  Left Ear: Tympanic membrane and ear canal normal.  Cardiovascular: Normal rate, regular rhythm and normal heart sounds.   Pulmonary/Chest: Effort normal and breath sounds normal.  Genitourinary: Pelvic exam was performed with patient supine. There is no rash or lesion on the  right labia. There is no rash or lesion on the left labia. Cervix exhibits no motion tenderness. Vaginal discharge found.    Neurological: She is alert.  Psychiatric: Her speech is normal and behavior is normal. Thought content normal. Her affect is blunt.       Groomed. Good Hygiene Fair Eye contact. Voice soft.          Assessment & Plan:

## 2012-02-01 NOTE — Assessment & Plan Note (Signed)
Resistance to removal of Mirena using ring forceps prevented removal of IUD. Patient agreed to consultation with GYN at [facility] for removal of device.   Referral was made. Pt resistant to any other non-barrier contraception at this time.

## 2012-02-01 NOTE — Assessment & Plan Note (Addendum)
Stable currently. Medications (Depakote, Fluphenazine, Cogentin) managed through [facility].

## 2012-02-15 ENCOUNTER — Telehealth: Payer: Self-pay | Admitting: Family Medicine

## 2012-02-15 DIAGNOSIS — IMO0001 Reserved for inherently not codable concepts without codable children: Secondary | ICD-10-CM

## 2012-02-15 HISTORY — DX: Reserved for inherently not codable concepts without codable children: IMO0001

## 2012-02-15 NOTE — Telephone Encounter (Signed)
ASC-US on 02/03/2012 pap (associated Trichomonas infection). No reflex HPV testing performed on specimen.  Patient informed that she will need repeat Pap in one year.   Pt reports taking her Diflucan and Flagyl for yeast vaginitis and trichomonas vaginitis.   Pt was able to repeat back to me that she will need to have a repeat Pap smear in one year.

## 2012-02-24 ENCOUNTER — Encounter: Payer: Self-pay | Admitting: Obstetrics & Gynecology

## 2012-02-24 ENCOUNTER — Ambulatory Visit (INDEPENDENT_AMBULATORY_CARE_PROVIDER_SITE_OTHER): Payer: Medicare Other | Admitting: Obstetrics & Gynecology

## 2012-02-24 VITALS — BP 138/90 | HR 113 | Temp 98.0°F | Ht 67.75 in | Wt 267.7 lb

## 2012-02-24 DIAGNOSIS — Z30432 Encounter for removal of intrauterine contraceptive device: Secondary | ICD-10-CM

## 2012-02-24 DIAGNOSIS — Z3009 Encounter for other general counseling and advice on contraception: Secondary | ICD-10-CM | POA: Diagnosis not present

## 2012-02-24 NOTE — Patient Instructions (Signed)
Pilonidal Cyst  A pilonidal cyst occurs when hairs get trapped (ingrown) beneath the skin in the crease between the buttocks over your sacrum (the bone under that crease). Pilonidal cysts are most common in young men with a lot of body hair. When the cyst is ruptured (breaks) or leaking, fluid from the cyst may cause burning and itching. If the cyst becomes infected, it causes a painful swelling filled with pus (abscess). The pus and trapped hairs need to be removed (often by lancing) so that the infection can heal. However, recurrence is common and an operation may be needed to remove the cyst.  HOME CARE INSTRUCTIONS    If the cyst was NOT INFECTED:   Keep the area clean and dry. Bathe or shower daily. Wash the area well with a germ-killing soap. Warm tub baths may help prevent infection and help with drainage. Dry the area well with a towel.   Avoid tight clothing to keep area as moisture free as possible.   Keep area between buttocks as free of hair as possible. A depilatory may be used.   If the cyst WAS INFECTED and needed to be drained:   Your caregiver packed the wound with gauze to keep the wound open. This allows the wound to heal from the inside outwards and continue draining.   Return for a wound check in 1 day or as suggested.   If you take tub baths or showers, repack the wound with gauze following them. Sponge baths (at the sink) are a good alternative.   If an antibiotic was ordered to fight the infection, take as directed.   Only take over-the-counter or prescription medicines for pain, discomfort, or fever as directed by your caregiver.   After the drain is removed, use sitz baths for 20 minutes 4 times per day. Clean the wound gently with mild unscented soap, pat dry, and then apply a dry dressing.  SEEK MEDICAL CARE IF:    You have increased pain, swelling, redness, drainage, or bleeding from the area.   You have a fever.   You have muscles aches, dizziness, or a general ill  feeling.  Document Released: 04/16/2000 Document Revised: 07/12/2011 Document Reviewed: 06/14/2008  [employer] Patient Information 2013 [employer].

## 2012-02-24 NOTE — Progress Notes (Signed)
Subjective:     Patient ID: Molly [name], female   DOB: 1985/11/11, 26 y.o.   MRN: [id number]  HPI Pt presents for IUD removal.  Pt reports that it is not giving her problems.  She denies pain and is not planning to have children any time soon.  She reports that 'it is due to be removed Oct of '14 so she might as well have it removed now'.  An attempt was made to remove it in her Primary care physicians ofc without success.  Pt also c/o pain in buttocks.   Review of Systems     Objective:   Physical ExamBP 138/90  Pulse 113  Temp 98 F (36.7 C) (Oral)  Ht 5' 7.75" (1.721 m)  Wt 267 lb 11.2 oz (121.428 kg)  BMI 41.00 kg/m2 GU: EGBUS: no lesions Vagina: no blood in vault Cervix: no lesion; no mucopurulent d/c; IUS strings noted and pulled without difficulty Buttocks:  Upper buttocks there is a large indurated area.         Assessment:     IUD removal.  Attempted to get a clearer understanding of why pt wanted removal but pt was adamant so the lesion was removed.      Plan:     Rec f/u with primary care to eval for possible pilonidal cyst/abscess F/u prn  Jatorian Renault L. Harraway-Smith, M.D., [place] Core

## 2012-03-03 ENCOUNTER — Ambulatory Visit: Payer: Medicare Other | Admitting: Family Medicine

## 2012-04-06 DIAGNOSIS — F259 Schizoaffective disorder, unspecified: Secondary | ICD-10-CM | POA: Diagnosis not present

## 2012-04-24 ENCOUNTER — Encounter: Payer: Self-pay | Admitting: Family Medicine

## 2012-04-24 ENCOUNTER — Ambulatory Visit (INDEPENDENT_AMBULATORY_CARE_PROVIDER_SITE_OTHER): Payer: Medicare Other | Admitting: Family Medicine

## 2012-04-24 VITALS — BP 111/72 | HR 93 | Temp 97.9°F | Ht 67.75 in | Wt 277.0 lb

## 2012-04-24 DIAGNOSIS — N912 Amenorrhea, unspecified: Secondary | ICD-10-CM

## 2012-04-24 DIAGNOSIS — E119 Type 2 diabetes mellitus without complications: Secondary | ICD-10-CM | POA: Diagnosis not present

## 2012-04-24 LAB — POCT GLYCOSYLATED HEMOGLOBIN (HGB A1C): Hemoglobin A1C: 5.7

## 2012-04-24 NOTE — Patient Instructions (Signed)
We are checking your blood sugar test (A1c) for blood sugar control.  Also checking your blood to see if you could still be pregnant even though your urine pregnancy was negative.

## 2012-04-25 ENCOUNTER — Encounter: Payer: Self-pay | Admitting: Family Medicine

## 2012-04-25 DIAGNOSIS — N912 Amenorrhea, unspecified: Secondary | ICD-10-CM | POA: Insufficient documentation

## 2012-04-25 LAB — HCG, SERUM, QUALITATIVE: Preg, Serum: NEGATIVE

## 2012-04-25 NOTE — Progress Notes (Signed)
  Subjective:    Patient ID: Molly [name], female    DOB: 1985-12-26, 26 y.o.   MRN: [id number]  HPI  Amenorrhea Pt taking Fluphenazine Recent removal of Mirena. No return of Menses with removal of Mirena. Has become sexually active with a man.  Expresses ambivalent about becoming pregnant.  States that with her prior pregnancy, she did not test positive for pregnancy using urine pregnacy tests, only with blood tests.    Review of Systems     Objective:   Physical Exam        Assessment & Plan:

## 2012-04-25 NOTE — Assessment & Plan Note (Signed)
No evidence of pregnancy on Upreg or qualitative Beta HCG, Suspect amenorrhea related to her neuroleptic medication, Fluphenazine, therapy. Pt not interested in contraception since starting a sexual relationship though seems ambivalent at prospect of pregnancy

## 2012-07-04 DIAGNOSIS — F259 Schizoaffective disorder, unspecified: Secondary | ICD-10-CM | POA: Diagnosis not present

## 2012-08-31 ENCOUNTER — Encounter: Payer: Self-pay | Admitting: Family Medicine

## 2012-08-31 ENCOUNTER — Other Ambulatory Visit (HOSPITAL_COMMUNITY)
Admission: RE | Admit: 2012-08-31 | Discharge: 2012-08-31 | Disposition: A | Discharge status: Home / Self Care | Payer: Medicare Other | Source: Ambulatory Visit | Attending: Family Medicine | Admitting: Family Medicine

## 2012-08-31 ENCOUNTER — Ambulatory Visit (INDEPENDENT_AMBULATORY_CARE_PROVIDER_SITE_OTHER): Payer: Medicare Other | Admitting: Family Medicine

## 2012-08-31 ENCOUNTER — Telehealth: Payer: Self-pay | Admitting: Family Medicine

## 2012-08-31 VITALS — BP 122/75 | HR 103 | Temp 98.2°F | Ht 67.75 in | Wt 278.0 lb

## 2012-08-31 DIAGNOSIS — R21 Rash and other nonspecific skin eruption: Secondary | ICD-10-CM | POA: Diagnosis not present

## 2012-08-31 DIAGNOSIS — Z124 Encounter for screening for malignant neoplasm of cervix: Secondary | ICD-10-CM | POA: Insufficient documentation

## 2012-08-31 DIAGNOSIS — Z1151 Encounter for screening for human papillomavirus (HPV): Secondary | ICD-10-CM | POA: Insufficient documentation

## 2012-08-31 DIAGNOSIS — B373 Candidiasis of vulva and vagina: Secondary | ICD-10-CM

## 2012-08-31 DIAGNOSIS — Z113 Encounter for screening for infections with a predominantly sexual mode of transmission: Secondary | ICD-10-CM | POA: Diagnosis not present

## 2012-08-31 DIAGNOSIS — Z7251 High risk heterosexual behavior: Secondary | ICD-10-CM

## 2012-08-31 DIAGNOSIS — R87810 Cervical high risk human papillomavirus (HPV) DNA test positive: Secondary | ICD-10-CM | POA: Insufficient documentation

## 2012-08-31 DIAGNOSIS — F17200 Nicotine dependence, unspecified, uncomplicated: Secondary | ICD-10-CM

## 2012-08-31 DIAGNOSIS — R6889 Other general symptoms and signs: Secondary | ICD-10-CM

## 2012-08-31 DIAGNOSIS — B3731 Acute candidiasis of vulva and vagina: Secondary | ICD-10-CM

## 2012-08-31 DIAGNOSIS — E119 Type 2 diabetes mellitus without complications: Secondary | ICD-10-CM | POA: Diagnosis not present

## 2012-08-31 DIAGNOSIS — A5901 Trichomonal vulvovaginitis: Secondary | ICD-10-CM

## 2012-08-31 DIAGNOSIS — Z79899 Other long term (current) drug therapy: Secondary | ICD-10-CM | POA: Diagnosis not present

## 2012-08-31 LAB — COMPREHENSIVE METABOLIC PANEL
Albumin: 4 g/dL (ref 3.5–5.2)
CO2: 21 mEq/L (ref 19–32)
Glucose, Bld: 101 mg/dL — ABNORMAL HIGH (ref 70–99)
Sodium: 135 mEq/L (ref 135–145)
Total Bilirubin: 0.2 mg/dL — ABNORMAL LOW (ref 0.3–1.2)
Total Protein: 7.2 g/dL (ref 6.0–8.3)

## 2012-08-31 LAB — CBC
HCT: 36 % (ref 36.0–46.0)
Hemoglobin: 12.2 g/dL (ref 12.0–15.0)
MCH: 29.8 pg (ref 26.0–34.0)
RBC: 4.09 MIL/uL (ref 3.87–5.11)

## 2012-08-31 LAB — POCT WET PREP (WET MOUNT): WBC, Wet Prep HPF POC: >20

## 2012-08-31 MED ORDER — PERMETHRIN 5 % EX CREA: TOPICAL_CREAM | Freq: Once | CUTANEOUS | Status: DC

## 2012-08-31 MED ORDER — METRONIDAZOLE 500 MG PO TABS: 2000.0000 mg | ORAL_TABLET | Freq: Once | ORAL | Status: DC

## 2012-08-31 MED ORDER — FLUCONAZOLE 150 MG PO TABS: 150.0000 mg | ORAL_TABLET | Freq: Once | ORAL | Status: DC

## 2012-08-31 MED ORDER — TRIAMCINOLONE ACETONIDE 0.5 % EX OINT: TOPICAL_OINTMENT | Freq: Two times a day (BID) | CUTANEOUS | Status: DC

## 2012-08-31 NOTE — Telephone Encounter (Signed)
I informed Molly [name] of her wet prep results showing yeast and trichomonas.  This is similar to wet prep findings in December 2013 office visit.   Pt says she took the flagyl and diflucan treatments in December.  A/ Trichomonas Vaginitis      Yeast Vagnitis  P/ Flagyl 2 gram oral once Diflucan 150 mg oral once.  Advised patient to notify her partner of her trichomonas infection and that she is to advise him that he needs treatment for trichomonas.  FOLLOW UP GC/Chlamydia assat on Pap smear.    Will recheck wet prep on next office visit .  Need HIV/RPR next office visit

## 2012-08-31 NOTE — Patient Instructions (Signed)
Apply the Permethrin to skin from neck to toes tonight at bedtime.  Leave it on over night, then shower the permethrin in the morning in order to kill any possible scabies.   You will continue to itch for a couple weeks after the Permethrin treatment.  Use the Triamcinolone cream twice a day to itching skin for next 10 days to help decrease the itching.  Return to the clinic if your condition is not better in 2 weeks, or if your condition worsens.   Scabies Scabies are small bugs (mites) that burrow under the skin and cause red bumps and severe itching. These bugs can only be seen with a microscope. Scabies are highly contagious. They can spread easily from person to person by direct contact. They are also spread through sharing clothing or linens that have the scabies mites living in them. It is not unusual for an entire family to become infected through shared towels, clothing, or bedding.  HOME CARE INSTRUCTIONS   Your caregiver may prescribe a cream or lotion to kill the mites. If cream is prescribed, massage the cream into the entire body from the neck to the bottom of both feet. Also massage the cream into the scalp and face if your child is less than 93 year old. Avoid the eyes and mouth. Do not wash your hands after application.  Leave the cream on for 8 to 12 hours. Your child should bathe or shower after the 8 to 12 hour application period. Sometimes it is helpful to apply the cream to your child right before bedtime.  One treatment is usually effective and will eliminate approximately 95% of infestations. For severe cases, your caregiver may decide to repeat the treatment in 1 week. Everyone in your household should be treated with one application of the cream.  New rashes or burrows should not appear within 24 to 48 hours after successful treatment. However, the itching and rash may last for 2 to 4 weeks after successful treatment. Your caregiver may prescribe a medicine to help with the  itching or to help the rash go away more quickly.  Scabies can live on clothing or linens for up to 3 days. All of your child's recently used clothing, towels, stuffed toys, and bed linens should be washed in hot water and then dried in a dryer for at least 20 minutes on high heat. Items that cannot be washed should be enclosed in a plastic bag for at least 3 days.  To help relieve itching, bathe your child in a cool bath or apply cool washcloths to the affected areas.  Your child may return to school after treatment with the prescribed cream. SEEK MEDICAL CARE IF:   The itching persists longer than 4 weeks after treatment.  The rash spreads or becomes infected. Signs of infection include red blisters or yellow-tan crust. Document Released: 04/19/2005 Document Revised: 07/12/2011 Document Reviewed: 08/28/2008 [facility] Patient Information 2013 [place], [place].

## 2012-09-01 ENCOUNTER — Encounter: Payer: Self-pay | Admitting: Family Medicine

## 2012-09-01 DIAGNOSIS — R21 Rash and other nonspecific skin eruption: Secondary | ICD-10-CM | POA: Insufficient documentation

## 2012-09-01 LAB — VALPROIC ACID LEVEL: Valproic Acid Lvl: 54.3 ug/mL (ref 50.0–100.0)

## 2012-09-01 NOTE — Progress Notes (Signed)
  Subjective:    Patient ID: Molly [name], female    DOB: [birth year], 27 y.o.   MRN: [id number]  HPI RASH  Location: thoracic back, arms, between fingers, abdomen, proximal thights Onset: 2-3 weeks ago  Course: stable Self-treated with: nothing          History Pruritis: yes, very itchy  Tenderness: no  New medications/antibiotics: no  Tick/insect/pet exposure: no, living in a group half-way home. No one else with rash to her knowledge.   Recent travel: no  New detergent, new clothing, or other topical exposure: no   Red Flags Feeling ill: no  Fever: no  Mouth lesions: no  Facial/tongue swelling/difficulty breathing:  no  Diabetic or immunocompromised: yes, diabetes    History of ASC-US pap smear about 9 months ago, and History of Trichomonas/Yeast vaginitis 01/2012. Sexually active with same partner - uncertain if mutually monogamous.  .  Reports taking the Flagyl and Diflucan prescribed 01/2012. Pt reports that partner was also treated for trichomonas infection. No discharge  Odor: no  Itching: no  Symptoms Dysuria: no  Bleeding: no  Pelvic pain: no  Genital sores: no  Rash: yes, see above  Dyspareunia: no  GI Sxs: no  Prior treatment: yes, see above   Red Flags: Missed period: irregular menses  Pregnancy: no  Recent antibiotics: no  Sexual activity: yes  Possible STD exposure: yes  IUD: no  Diabetes: yes     SH: (+) smoking cigarettes.  Review of Systems  See HPI     Objective:   Physical Exam  Constitutional: No distress.  Obese   HENT:  Mouth/Throat:    Genitourinary: Pelvic exam was performed with patient supine. There is no rash on the right labia. There is no rash on the left labia. Uterus is not enlarged and not tender. Cervix exhibits no motion tenderness, no discharge and no friability. No tenderness or bleeding around the vagina. Vaginal discharge (white curd) found.  Lymphadenopathy:    She has no cervical adenopathy.    She has no  axillary adenopathy.       Right: No inguinal and no supraclavicular adenopathy present.       Left: No inguinal and no supraclavicular adenopathy present.  Skin:             Assessment & Plan:

## 2012-09-01 NOTE — Assessment & Plan Note (Signed)
Repeat Pap smear with High-Risk HPV testing sent.

## 2012-09-01 NOTE — Assessment & Plan Note (Signed)
Recurrent. Asymptomatic. Rx Diflucan 150 mg x 1 oral.

## 2012-09-01 NOTE — Assessment & Plan Note (Signed)
Contemplative phase of change.  Gave encouragement and informed her of availability of assistance with quitting process.

## 2012-09-01 NOTE — Assessment & Plan Note (Signed)
Recurrent. Asymptomatic. Rx Flagyl 2 gram oral once.   Advised to inform partner. GC/Chlamydia testing requested with Pap smear. Will offer HIV/RPR testing next office visit.

## 2012-09-01 NOTE — Assessment & Plan Note (Addendum)
Working diagnosis is Scabies.  Differential includes : Bed bug bites, other insect bites, and atopic dermatitis.  Plan: permethrin 5% cream neck to toes overnight then shower off Topical triamcinolone for next 1 to 2 weeks.  Hesitant to use systemic corticosteroids because of patients major psychiatric complains complains of-morbidities.  Pt education on infestation eradication given.     Pt to return to clinic if not improved in two weeks or if condition worsens.

## 2012-09-01 NOTE — Assessment & Plan Note (Addendum)
Lab Results  Component Value Date   HGBA1C 5.5 08/31/2012  Adequate glycemic control.   No new end-organ damage.  No addition of medication.

## 2012-09-08 ENCOUNTER — Telehealth: Payer: Self-pay | Admitting: Family Medicine

## 2012-09-08 ENCOUNTER — Encounter: Payer: Self-pay | Admitting: Family Medicine

## 2012-09-08 ENCOUNTER — Telehealth: Payer: Self-pay | Admitting: *Deleted

## 2012-09-08 NOTE — Telephone Encounter (Signed)
Left message for pt to call back.  Please schedule with colpo clinic when she does.  Thank you, Michole Lecuyer

## 2012-09-08 NOTE — Telephone Encounter (Signed)
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            I have informed patient of abnormal Pap result.  ------

## 2012-09-08 NOTE — Telephone Encounter (Signed)
I was talking to patient about her recurrent abnormal Pap smear when the phone connection apparently failed.  I had told patient about abnormal Pap and need for colposcopic evaluation but uncertain if she heard me.  Only got voice mails when immediately attempted to call again. Left message for patient to call [facility] to discuss coming in for colposcopic evaluation at [facility] colpo clinic.

## 2012-09-08 NOTE — Assessment & Plan Note (Addendum)
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

## 2012-09-11 ENCOUNTER — Encounter: Payer: Self-pay | Admitting: Family Medicine

## 2012-09-11 NOTE — Telephone Encounter (Signed)
Sent registered letter to patient with Pap smear 08/31/12 results, an explanation of their clinical significance and asking patient to contact our office to schedule a colposcopic exam.

## 2012-09-11 NOTE — Telephone Encounter (Signed)
Left message asking patient to call about Pap smear results.

## 2012-09-14 ENCOUNTER — Telehealth: Payer: Self-pay | Admitting: *Deleted

## 2012-09-14 NOTE — Telephone Encounter (Signed)
Assisted Kerri with explaining importance of appointment with colpo clinic and what abnormal pap smears could be indicator of . Pt verbalized understanding. Lynnea Ferrier will return call to make appointment for pt to follow up with colpo clinic. Wyatt Haste, RN-BSN

## 2012-09-21 ENCOUNTER — Encounter: Payer: Self-pay | Admitting: Family Medicine

## 2012-09-21 ENCOUNTER — Ambulatory Visit (INDEPENDENT_AMBULATORY_CARE_PROVIDER_SITE_OTHER): Payer: Medicare Other | Admitting: Family Medicine

## 2012-09-21 VITALS — BP 114/72 | HR 85 | Temp 98.1°F | Ht 67.75 in | Wt 276.0 lb

## 2012-09-21 DIAGNOSIS — R6889 Other general symptoms and signs: Secondary | ICD-10-CM

## 2012-09-21 MED ORDER — TRIAMCINOLONE ACETONIDE 0.5 % EX OINT: TOPICAL_OINTMENT | Freq: Two times a day (BID) | CUTANEOUS | Status: DC

## 2012-09-21 NOTE — Progress Notes (Signed)
Patient ID: Molly [name], female   DOB: 07-Dec-1985, 27 y.o.   MRN: [id number] Patient given informed consent, signed copy in the chart.  Placed in lithotomy position. Cervix viewed with speculum and colposcope after application of acetic acid.   Colposcopy adequate (entire squamocolumnar junctions seen  in entirety) ?  yes Acetowhite lesions?no Punctation?no Mosaicism?  no Abnormal vasculature?  no Biopsies?no ECC?no Complications? none  COMMENTS: Patient was given post procedure instructions. I would recommend repeat pap in one year as colpo was cl;inically negative.

## 2012-10-03 DIAGNOSIS — F259 Schizoaffective disorder, unspecified: Secondary | ICD-10-CM | POA: Diagnosis not present

## 2012-12-01 ENCOUNTER — Ambulatory Visit: Payer: Medicare Other | Admitting: Family Medicine

## 2012-12-12 ENCOUNTER — Inpatient Hospital Stay (HOSPITAL_COMMUNITY)
Admission: AD | Admit: 2012-12-12 | Discharge: 2012-12-12 | Disposition: A | Discharge status: Home / Self Care | Payer: Medicare Other | Source: Ambulatory Visit | Attending: Obstetrics and Gynecology | Admitting: Obstetrics and Gynecology

## 2012-12-12 ENCOUNTER — Encounter (HOSPITAL_COMMUNITY): Payer: Self-pay | Admitting: *Deleted

## 2012-12-12 DIAGNOSIS — N912 Amenorrhea, unspecified: Secondary | ICD-10-CM

## 2012-12-12 DIAGNOSIS — Z3202 Encounter for pregnancy test, result negative: Secondary | ICD-10-CM | POA: Insufficient documentation

## 2012-12-12 HISTORY — DX: Reserved for concepts with insufficient information to code with codable children: IMO0002

## 2012-12-12 HISTORY — DX: Dermatitis, unspecified: L30.9

## 2012-12-12 HISTORY — DX: Unspecified abnormal cytological findings in specimens from cervix uteri: R87.619

## 2012-12-12 LAB — POCT PREGNANCY, URINE: Preg Test, Ur: NEGATIVE

## 2012-12-12 NOTE — MAU Note (Signed)
Hasn't had a period in a couple months,   Did a home test, that was negative.  Had mirena removed, this year, home period after, none since.

## 2012-12-12 NOTE — MAU Provider Note (Signed)
History     CSN: [id number]  Arrival date and time: 12/12/12 1257   First Provider Initiated Contact with Patient 12/12/12 1552      Chief Complaint  Patient presents with  . Possible Pregnancy   The history is provided by the patient.   Molly [name] 27 y.o. [id number]; presents to MAU with possible pregnancy. She had a negative pregnancy test at home however desires a confirmation pregnancy test done here in MAU. She had her IUD removed in May 2014 and now desires pregnancy. She had a normal period in May following IUD removal, however has not had a period since then. She is a patient of [name] and wishes to be seen somewhere else for care.   OB History   Grav Para Term Preterm Abortions TAB SAB Ect Mult Living   1 1 1       1       Past Medical History  Diagnosis Date  . Schizophrenia   . CERVICITIS, GONOCOCCAL, History of 01/09/2007    Qualifier: History of  By: McDiarmid MD, Tawanna Cooler    . ATTENTION DEFICIT, W/O HYPERACTIVITY, History of 06/30/2006    Qualifier: History of  By: McDiarmid MD, Tawanna Cooler    . Depression   . 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 Dennie Bible (Psych) At [facility] in  [place], [place]. Qualifier: Hospitalized for  By: McDiarmid MD, Tawanna Cooler    . 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 TOBACCO USER 02/08/2009    Qualifier: Diagnosis of  By: Knox Royalty    . 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 ASC-cannot exclude HGSIL on Pap 02/15/2012    ASC-US on 02/03/2012 pap (associated Trichomonas infection). No reflex HPV testing performed on specimen.  Patient informed that she will need repeat Pap in one year.      . Diabetes mellitus     diet controlled  . Eczema   . Abnormal Pap smear     Past Surgical History  Procedure Laterality Date  . Incision and drainage      pilanodal  cyst    Family History  Problem Relation Age of Onset  . Adopted: Yes  . Bipolar disorder Sister   . Alcohol abuse Brother   . Cancer Father   . Diabetes Mother     History  Substance Use Topics  . Smoking status: Current Every Day Smoker -- 0.50 packs/day for 10 years    Types: Cigarettes  . Smokeless tobacco: Never Used  . Alcohol Use: Yes     Comment: occ    Allergies: No Known Allergies  Prescriptions prior to admission  Medication Sig Dispense Refill  . benztropine (COGENTIN) 1 MG tablet Take 1 mg by mouth 2 (two) times daily.       . clonazePAM (KLONOPIN) 1 MG tablet Take 1 mg by mouth at bedtime.      . divalproex (DEPAKOTE) 250 MG DR tablet Take 250 mg by mouth at bedtime.      . divalproex (DEPAKOTE) 500 MG DR tablet Take 500 mg by mouth 2 (two) times daily. TAKE 2 TABLETS AT BEDTIME      . fluconazole (DIFLUCAN) 150 MG tablet Take 1 tablet (150 mg total) by mouth once.  1 tablet  0  . fluPHENAZine (  PROLIXIN) 5 MG tablet Take 15 mg by mouth 2 (two) times daily.      . metroNIDAZOLE (FLAGYL) 500 MG tablet Take 4 tablets (2,000 mg total) by mouth once.  4 tablet  0  . permethrin (ACTICIN) 5 % cream Apply topically once.  60 g  0  . triamcinolone ointment (KENALOG) 0.5 % Apply topically 2 (two) times daily. To itching skin for next 10 days.  120 g  1   Results for orders placed during the hospital encounter of 12/12/12 (from the past 24 hour(s))  POCT PREGNANCY, URINE     Status: None   Collection Time    12/12/12  1:24 PM      Result Value Range   Preg Test, Ur NEGATIVE  NEGATIVE   Review of Systems  Constitutional: Negative for fever.  Gastrointestinal: Negative for nausea, vomiting and abdominal pain.  Genitourinary: Negative.        No vaginal discharge. No vaginal bleeding. No dysuria.    Physical Exam   Blood pressure 117/74, pulse 90, temperature 98.2 F (36.8 C), temperature source Oral, resp. rate 18.  Physical Exam  Constitutional: She appears  well-developed and well-nourished. No distress.    MAU Course  Procedures  MDM Negative urine pregnancy test   Assessment and Plan  A: Secondary Amenorrhea   P: Follow up in GYN clinic, message sent/referral made.  Pregnancy risk discussed due to multiple high risk medications   RASCH, JENNIFER IRENE 12/12/2012, 3:53 PM

## 2012-12-13 ENCOUNTER — Encounter: Payer: Self-pay | Admitting: Obstetrics & Gynecology

## 2012-12-13 NOTE — MAU Provider Note (Signed)
Attestation of Attending Supervision of Advanced Practitioner (CNM/NP): Evaluation and management procedures were performed by the Advanced Practitioner under my supervision and collaboration.  I have reviewed the Advanced Practitioner's note and chart, and I agree with the management and plan.  Wanda Cellucci 12/13/2012 8:39 AM   

## 2012-12-19 DIAGNOSIS — F259 Schizoaffective disorder, unspecified: Secondary | ICD-10-CM | POA: Diagnosis not present

## 2012-12-25 ENCOUNTER — Other Ambulatory Visit (HOSPITAL_COMMUNITY)
Admission: RE | Admit: 2012-12-25 | Discharge: 2012-12-25 | Disposition: A | Discharge status: Home / Self Care | Payer: Medicare Other | Source: Ambulatory Visit | Attending: Family Medicine | Admitting: Family Medicine

## 2012-12-25 ENCOUNTER — Encounter: Payer: Self-pay | Admitting: Family Medicine

## 2012-12-25 ENCOUNTER — Ambulatory Visit (INDEPENDENT_AMBULATORY_CARE_PROVIDER_SITE_OTHER): Payer: Medicare Other | Admitting: Family Medicine

## 2012-12-25 VITALS — BP 105/75 | HR 105 | Temp 98.2°F | Wt 286.0 lb

## 2012-12-25 DIAGNOSIS — Z206 Contact with and (suspected) exposure to human immunodeficiency virus [HIV]: Secondary | ICD-10-CM

## 2012-12-25 DIAGNOSIS — Z113 Encounter for screening for infections with a predominantly sexual mode of transmission: Secondary | ICD-10-CM | POA: Diagnosis not present

## 2012-12-25 DIAGNOSIS — E119 Type 2 diabetes mellitus without complications: Secondary | ICD-10-CM | POA: Diagnosis not present

## 2012-12-25 DIAGNOSIS — N912 Amenorrhea, unspecified: Secondary | ICD-10-CM | POA: Diagnosis not present

## 2012-12-25 DIAGNOSIS — Z209 Contact with and (suspected) exposure to unspecified communicable disease: Secondary | ICD-10-CM | POA: Diagnosis not present

## 2012-12-25 DIAGNOSIS — Z20828 Contact with and (suspected) exposure to other viral communicable diseases: Secondary | ICD-10-CM

## 2012-12-25 LAB — POCT WET PREP (WET MOUNT)

## 2012-12-25 NOTE — Patient Instructions (Signed)
Your Urine pregnancy test was negative.   We are waiting on your blood pregnancy test results. We will contact you with the results of your HIV, Syphilis, Gonorrhea, and Chlamydia testing.

## 2012-12-25 NOTE — Progress Notes (Signed)
  Subjective:    Patient ID: Molly [name], female    DOB: December 05, 1985, 27 y.o.   MRN: [id number]  HPI  Amenorrhea - Pt was seen for amenorrhea and concern for pregnancy recently at [facility].  They have set her up with an apppointment at [facility] at [facility] for late September to address her amenorrhea and  Contraception Vs conception desires. - Longstanding issue for [name] since she returned to our practice as an adult. - Pt had Mirena removed this years. - She is not on any other contraception.   - Ambivalent about getting pregnant - Haydin is in a heterosexual relationship.  She believes she is not her partner's only partner. She is concerned for contracting STDs - Patient has had PID, GC and Chlamydia in the past.  - Recent Pap showed ASC-H with High-risk HPV (+).  A recent follow up Colposcopy in [facility] did not find any concerning lesions. Recommended repeat Pap in one year.      Review of Systems  Constitutional: Negative for fever and fatigue.  Cardiovascular: Negative for chest pain.  Gastrointestinal: Negative for nausea, vomiting, diarrhea and constipation.  Genitourinary: Negative for dysuria, vaginal bleeding, vaginal discharge and dyspareunia.  Musculoskeletal: Negative for joint swelling and arthralgias.  Psychiatric/Behavioral: Negative for hallucinations.       Objective:   Physical Exam  Constitutional: Vital signs are normal.  Non-toxic appearance. No distress.  Eyes: Conjunctivae are normal.  Cardiovascular: Normal rate, regular rhythm and normal heart sounds.   Pulmonary/Chest: Effort normal and breath sounds normal.  Abdominal: Soft. Bowel sounds are normal. There is no hepatosplenomegaly. There is no tenderness.  Genitourinary: No breast swelling or tenderness. There is no rash on the right labia. There is no rash on the left labia. Cervix exhibits no motion tenderness and no friability. Vaginal discharge  (white discharge, slight malodor) found.    Lymphadenopathy:       Right: No inguinal adenopathy present.       Left: No inguinal adenopathy present.          Assessment & Plan:

## 2012-12-26 ENCOUNTER — Encounter: Payer: Self-pay | Admitting: Family Medicine

## 2012-12-26 DIAGNOSIS — Z209 Contact with and (suspected) exposure to unspecified communicable disease: Secondary | ICD-10-CM | POA: Insufficient documentation

## 2012-12-26 LAB — HIV ANTIBODY (ROUTINE TESTING W REFLEX): HIV: NONREACTIVE

## 2012-12-26 LAB — HCG, SERUM, QUALITATIVE: Preg, Serum: NEGATIVE

## 2012-12-26 NOTE — Assessment & Plan Note (Signed)
Checking for HIV, RPR, cervical GC/Chlamydia.  Wet prep showed only BV.  No trichomonas.

## 2012-12-26 NOTE — Assessment & Plan Note (Signed)
No evidence of pregnancy on Upreg or qualitative Beta HCG,  Suspect amenorrhea related to her neuroleptic medication, Fluphenazine, therapy.  Pt not interested in contraception since starting a sexual relationship though seems ambivalent at prospect of pregnancy. Pt will be seen at [facility] in late September.  I encouraged Rillie to discuss her desires about conception as If she does desire it, she will need to discuss risks to herself and fetus from her current medications.

## 2012-12-28 ENCOUNTER — Ambulatory Visit: Payer: Medicare Other | Admitting: Family Medicine

## 2013-01-24 ENCOUNTER — Ambulatory Visit (INDEPENDENT_AMBULATORY_CARE_PROVIDER_SITE_OTHER): Payer: Medicare Other | Admitting: Obstetrics & Gynecology

## 2013-01-24 ENCOUNTER — Encounter: Payer: Self-pay | Admitting: Obstetrics & Gynecology

## 2013-01-24 VITALS — BP 123/92 | HR 94 | Temp 97.7°F | Ht 67.0 in | Wt 286.2 lb

## 2013-01-24 DIAGNOSIS — N912 Amenorrhea, unspecified: Secondary | ICD-10-CM | POA: Diagnosis not present

## 2013-01-24 MED ORDER — PRENATAL VITAMINS 0.8 MG PO TABS: 1.0000 | ORAL_TABLET | Freq: Every day | ORAL | Status: DC

## 2013-01-24 NOTE — Progress Notes (Signed)
Subjective:     Patient ID: Molly [name], female   DOB: 08/07/1985, 27 y.o.   MRN: [id number]  HPI Pt reports amenorrhea for 3 months.  Had IUD reomoved in May.  Has had only 1 cycle since that time.  She wants to conceive.  Her partner does not.  Has no other sx.  She is having unprotected intercourse.     Review of Systems     Objective:   Physical Exam BP 123/92  Pulse 94  Temp(Src) 97.7 F (36.5 C) (Oral)  Ht 5\' 7"  (1.702 m)  Wt 286 lb 3.2 oz (129.819 kg)  BMI 44.81 kg/m2  Exam deferred    Assessment:     Amenorrhea suspect due to progestin effect       Plan:     PNV daily F/u in 9 months or sooner prn

## 2013-01-24 NOTE — Patient Instructions (Signed)
Folic Acid in Pregnancy Folic acid is a B vitamin that helps prevent neural tube defects (NTDs). The neural tube is the part of a developing baby that becomes the brain and spinal cord. When the neural tube does not close properly, a baby is born with an NTD. NTDs include spina bifida, hernia of the spinal cord, and the absence of part of, or all of the brain (anencephaly).  Take folic acid at least 4 weeks before getting pregnant and through the first 3 months of pregnancy. This is when the neural tube is developing. It is available in most multivitamins, as a folic acid-only supplement, and in some foods. Taking the right amount of folic acid before conception and during pregnancy lessens the chances of having a baby born with an NTD. Giving folic acid will not affect a neural tube defect if it is already present. DIAGNOSIS   An Alpha-Fetoprotein (AFP) blood or amniotic fluid test will show high levels of the alpha-feto protein if a woman is carrying a baby with an NTD. This test is done on all pregnant women in the first trimester.  An ultrasound may detect an NTD. WHAT YOU CAN DO:  Take a multivitamin with at least 0.4 milligrams (400 micrograms) of folic acid daily at least 4 weeks before getting pregnant and through the first 12 weeks of pregnancy.  If you have already had a pregnancy affected by an NTD, take 4 milligrams (4,000 micrograms) of folic acid daily. Take this amount 1 month before you start trying to get pregnant and continue through the first 3 months of pregnancy.  Talk to your caregiver if you are taking medicines for seizures. Your caregiver will be able to manage your seizure medicines and your pregnancy in the best way. FOLIC ACID IN FOODS Eat a healthy diet that has foods that contain folic acid, the natural form of the vitamin. Such foods include:  Fortified breakfast cereals.  Lentils.  Asparagus.  Spinach.  Organ meats (liver).  Black beans.  Peanuts (eat  only if you do not have a peanut allergy).  Broccoli.  Strawberries, oranges.  Orange juice (from concentrate is best).  Enriched breads and pasta.  Romaine lettuce. TALK TO YOUR CAREGIVER IF:  You are in your first trimester and have high blood sugar.  You are in your first trimester and have an oral temperature above 102 F (38.9 C).  You are in your first trimester and had sauna treatments.  You are pregnant (or want to become pregnant) and take a prescription medicine called valproic acid. In almost all cases, a fetus found to have an NTD will need specialized care that may not be available in all hospitals. Talk to your caregiver about what is best for you and your baby. Document Released: 04/22/2003 Document Revised: 07/12/2011 Document Reviewed: 07/23/2009 Exit[employer] Patient Information 2014 [employer].  

## 2013-02-19 ENCOUNTER — Ambulatory Visit: Payer: Medicare Other | Admitting: Family Medicine

## 2013-02-20 ENCOUNTER — Ambulatory Visit: Payer: Medicare Other

## 2013-02-21 ENCOUNTER — Ambulatory Visit: Payer: Medicare Other

## 2013-02-28 ENCOUNTER — Ambulatory Visit: Payer: Medicare Other | Admitting: Family Medicine

## 2013-03-08 ENCOUNTER — Ambulatory Visit: Payer: Medicare Other | Admitting: Family Medicine

## 2013-07-18 ENCOUNTER — Emergency Department (HOSPITAL_COMMUNITY)
Admission: EM | Admit: 2013-07-18 | Discharge: 2013-07-18 | Disposition: A | Discharge status: Home / Self Care | Payer: Medicare Other | Attending: Emergency Medicine | Admitting: Emergency Medicine

## 2013-07-18 ENCOUNTER — Encounter (HOSPITAL_COMMUNITY): Payer: Self-pay | Admitting: Emergency Medicine

## 2013-07-18 DIAGNOSIS — F3289 Other specified depressive episodes: Secondary | ICD-10-CM | POA: Insufficient documentation

## 2013-07-18 DIAGNOSIS — Z8742 Personal history of other diseases of the female genital tract: Secondary | ICD-10-CM | POA: Diagnosis not present

## 2013-07-18 DIAGNOSIS — Z79899 Other long term (current) drug therapy: Secondary | ICD-10-CM | POA: Diagnosis not present

## 2013-07-18 DIAGNOSIS — Z872 Personal history of diseases of the skin and subcutaneous tissue: Secondary | ICD-10-CM | POA: Diagnosis not present

## 2013-07-18 DIAGNOSIS — E119 Type 2 diabetes mellitus without complications: Secondary | ICD-10-CM | POA: Diagnosis not present

## 2013-07-18 DIAGNOSIS — Z8619 Personal history of other infectious and parasitic diseases: Secondary | ICD-10-CM | POA: Diagnosis not present

## 2013-07-18 DIAGNOSIS — F17200 Nicotine dependence, unspecified, uncomplicated: Secondary | ICD-10-CM | POA: Insufficient documentation

## 2013-07-18 DIAGNOSIS — L0231 Cutaneous abscess of buttock: Secondary | ICD-10-CM | POA: Diagnosis not present

## 2013-07-18 DIAGNOSIS — L03317 Cellulitis of buttock: Secondary | ICD-10-CM | POA: Diagnosis not present

## 2013-07-18 DIAGNOSIS — F329 Major depressive disorder, single episode, unspecified: Secondary | ICD-10-CM | POA: Insufficient documentation

## 2013-07-18 DIAGNOSIS — F200 Paranoid schizophrenia: Secondary | ICD-10-CM | POA: Diagnosis not present

## 2013-07-18 MED ORDER — SULFAMETHOXAZOLE-TRIMETHOPRIM 800-160 MG PO TABS: 2.0000 | ORAL_TABLET | Freq: Two times a day (BID) | ORAL | Status: DC

## 2013-07-18 MED ORDER — HYDROCODONE-ACETAMINOPHEN 5-325 MG PO TABS
1.0000 | ORAL_TABLET | Freq: Four times a day (QID) | ORAL | Status: DC | PRN
Start: 2013-07-18 — End: 2013-08-16

## 2013-07-18 MED ORDER — HYDROMORPHONE HCL PF 1 MG/ML IJ SOLN
1.0000 mg | Freq: Once | INTRAMUSCULAR | Status: AC
  Administered 2013-07-18: 1 mg via INTRAMUSCULAR
  Filled 2013-07-18: qty 1

## 2013-07-18 NOTE — ED Provider Notes (Signed)
CSN: [id number]     Arrival date & time 07/18/13  1347 History   First MD Initiated Contact with Patient 07/18/13 1519     Chief Complaint  Patient presents with  . Abscess     (Consider location/radiation/quality/duration/timing/severity/associated sxs/prior Treatment) HPI Patient presents to the emergency department with abscess to the left medial buttocks.  Patient, states, that she has had these types of abscesses in the past.  The patient, states, that she's had had them drained under anesthesia.  Patient denies fever, nausea, vomiting, chest pain, shortness of breath, weakness, dizziness, headache, blurred vision, or syncope.  The patient, states she did not take any medications prior to arrival.  Patient, states, that palpation makes the pain, worse Past Medical History  Diagnosis Date  . Schizophrenia   . CERVICITIS, GONOCOCCAL, History of 01/09/2007    Qualifier: History of  By: McDiarmid MD, Tawanna Coolerodd    . ATTENTION DEFICIT, W/O HYPERACTIVITY, History of 06/30/2006    Qualifier: History of  By: McDiarmid MD, Tawanna Coolerodd    . Depression   . 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 Dennie Bibleichard Larsen (Psych) [facility] in  [place], [place]. Qualifier: Hospitalized for  By: McDiarmid MD, Tawanna Coolerodd    . 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 TOBACCO USER 02/08/2009    Qualifier: Diagnosis of  By: Knox Royaltydell, Erin    . 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 ASC-cannot exclude HGSIL on Pap 02/15/2012    ASC-US on 02/03/2012 pap (associated Trichomonas infection). No reflex HPV testing performed on specimen.  Patient informed that she will need repeat Pap in one year.      . Diabetes mellitus     diet controlled  . Eczema   . Abnormal Pap smear    Past Surgical History  Procedure Laterality Date  . Incision and drainage      pilanodal cyst    Family History  Problem Relation Age of Onset  . Adopted: Yes  . Bipolar disorder Sister   . Alcohol abuse Brother   . Cancer Father   . Diabetes Mother    History  Substance Use Topics  . Smoking status: Current Every Day Smoker -- 0.50 packs/day for 10 years    Types: Cigarettes  . Smokeless tobacco: Never Used  . Alcohol Use: Yes     Comment: occ   OB History   Grav Para Term Preterm Abortions TAB SAB Ect Mult Living   1 1 1       1      Review of Systems  All other systems negative except as documented in the HPI. All pertinent positives and negatives as reviewed in the HPI.  Allergies  Review of patient's allergies indicates no known allergies.  Home Medications   Current Outpatient Rx  Name  Route  Sig  Dispense  Refill  . benztropine (COGENTIN) 1 MG tablet   Oral   Take 1 mg by mouth 2 (two) times daily.          . clonazePAM (KLONOPIN) 1 MG tablet   Oral   Take 1 mg by mouth at bedtime.         . divalproex (DEPAKOTE ER) 500 MG 24 hr tablet   Oral   Take 500-750 mg by mouth 2 (two) times daily.  500mg  in the morning 750 mg at bedtime         . fluPHENAZine (PROLIXIN) 5 MG tablet   Oral   Take 15 mg by mouth 2 (two) times daily.          BP 106/76  Pulse 100  Temp(Src) 99.3 F (37.4 C) (Oral)  Resp 18  SpO2 100%  LMP 06/20/2013 Physical Exam  Nursing note and vitals reviewed. Constitutional: She appears well-developed and well-nourished. No distress.  HENT:  Head: Normocephalic and atraumatic.  Cardiovascular: Normal rate, regular rhythm and normal heart sounds.  Exam reveals no gallop and no friction rub.   No murmur heard. Pulmonary/Chest: Effort normal and breath sounds normal. No respiratory distress.  Skin: Skin is warm and dry. No ecchymosis and no rash noted. She is not diaphoretic.       ED Course  Procedures (including critical care time)  INCISION AND DRAINAGE Performed by: Carlyle Dolly Consent: Verbal  consent obtained. Risks and benefits: risks, benefits and alternatives were discussed Type: abscess  Body area: Left medial buttocks  Anesthesia: local infiltration  Incision was made with a scalpel.  Local anesthetic: lidocaine 2 % with epinephrine  Anesthetic total: 7 ml  Complexity: complex Blunt dissection to break up loculations  Drainage: purulent  Drainage amount: Large   Packing material: 1/4 in iodoform gauze  Patient tolerance: Patient tolerated the procedure well with no immediate complications.  Patient is advised to return here to have packing removed.  Also given a referral to surgery for further evaluation.    Carlyle Dolly, PA-C 07/18/13 1714

## 2013-07-18 NOTE — Discharge Instructions (Signed)
Return here as needed.  Followup with the surgeon, provided.  Keep the area covered, clean, gently around the area.  Use heat around the area as well

## 2013-07-18 NOTE — ED Provider Notes (Signed)
Medical screening examination/treatment/procedure(s) were performed by non-physician practitioner and as supervising physician I was immediately available for consultation/collaboration.   EKG Interpretation None       Ethelda ChickMartha K Linker, MD 07/18/13 [phone]

## 2013-07-18 NOTE — ED Notes (Signed)
Pt c/o abscess to lt butt cheek x 1 wk.

## 2013-07-30 ENCOUNTER — Ambulatory Visit: Payer: Medicare Other | Admitting: Family Medicine

## 2013-08-10 DIAGNOSIS — F209 Schizophrenia, unspecified: Secondary | ICD-10-CM | POA: Diagnosis not present

## 2013-08-16 ENCOUNTER — Emergency Department (HOSPITAL_COMMUNITY)
Admission: EM | Admit: 2013-08-16 | Discharge: 2013-08-16 | Disposition: A | Discharge status: Home / Self Care | Payer: Medicare Other | Attending: Emergency Medicine | Admitting: Emergency Medicine

## 2013-08-16 ENCOUNTER — Encounter (HOSPITAL_COMMUNITY): Payer: Self-pay | Admitting: Emergency Medicine

## 2013-08-16 DIAGNOSIS — Z8659 Personal history of other mental and behavioral disorders: Secondary | ICD-10-CM

## 2013-08-16 DIAGNOSIS — F329 Major depressive disorder, single episode, unspecified: Secondary | ICD-10-CM | POA: Diagnosis not present

## 2013-08-16 DIAGNOSIS — IMO0002 Reserved for concepts with insufficient information to code with codable children: Secondary | ICD-10-CM | POA: Diagnosis not present

## 2013-08-16 DIAGNOSIS — Z872 Personal history of diseases of the skin and subcutaneous tissue: Secondary | ICD-10-CM | POA: Diagnosis not present

## 2013-08-16 DIAGNOSIS — F411 Generalized anxiety disorder: Secondary | ICD-10-CM | POA: Insufficient documentation

## 2013-08-16 DIAGNOSIS — Z8619 Personal history of other infectious and parasitic diseases: Secondary | ICD-10-CM | POA: Diagnosis not present

## 2013-08-16 DIAGNOSIS — F17200 Nicotine dependence, unspecified, uncomplicated: Secondary | ICD-10-CM | POA: Diagnosis not present

## 2013-08-16 DIAGNOSIS — Z79899 Other long term (current) drug therapy: Secondary | ICD-10-CM | POA: Insufficient documentation

## 2013-08-16 DIAGNOSIS — F3289 Other specified depressive episodes: Secondary | ICD-10-CM | POA: Insufficient documentation

## 2013-08-16 DIAGNOSIS — Z8742 Personal history of other diseases of the female genital tract: Secondary | ICD-10-CM | POA: Insufficient documentation

## 2013-08-16 DIAGNOSIS — Z792 Long term (current) use of antibiotics: Secondary | ICD-10-CM | POA: Diagnosis not present

## 2013-08-16 DIAGNOSIS — E119 Type 2 diabetes mellitus without complications: Secondary | ICD-10-CM | POA: Insufficient documentation

## 2013-08-16 DIAGNOSIS — F200 Paranoid schizophrenia: Secondary | ICD-10-CM | POA: Diagnosis not present

## 2013-08-16 DIAGNOSIS — Z9119 Patient's noncompliance with other medical treatment and regimen: Secondary | ICD-10-CM | POA: Insufficient documentation

## 2013-08-16 DIAGNOSIS — Z91199 Patient's noncompliance with other medical treatment and regimen due to unspecified reason: Secondary | ICD-10-CM | POA: Diagnosis not present

## 2013-08-16 DIAGNOSIS — Z9114 Patient's other noncompliance with medication regimen: Secondary | ICD-10-CM

## 2013-08-16 DIAGNOSIS — R451 Restlessness and agitation: Secondary | ICD-10-CM

## 2013-08-16 DIAGNOSIS — R4689 Other symptoms and signs involving appearance and behavior: Secondary | ICD-10-CM | POA: Diagnosis present

## 2013-08-16 LAB — COMPREHENSIVE METABOLIC PANEL
ALT: 19 U/L (ref 0–35)
AST: 22 U/L (ref 0–37)
Albumin: 3.9 g/dL (ref 3.5–5.2)
Alkaline Phosphatase: 89 U/L (ref 39–117)
BUN: 9 mg/dL (ref 6–23)
CHLORIDE: 99 meq/L (ref 96–112)
CO2: 25 meq/L (ref 19–32)
Calcium: 9.5 mg/dL (ref 8.4–10.5)
Creatinine, Ser: 0.76 mg/dL (ref 0.50–1.10)
GLUCOSE: 108 mg/dL — AB (ref 70–99)
Potassium: 3.8 mEq/L (ref 3.7–5.3)
Sodium: 136 mEq/L — ABNORMAL LOW (ref 137–147)
Total Protein: 7.9 g/dL (ref 6.0–8.3)

## 2013-08-16 LAB — CBC
HEMATOCRIT: 35.4 % — AB (ref 36.0–46.0)
Hemoglobin: 12.3 g/dL (ref 12.0–15.0)
MCH: 30 pg (ref 26.0–34.0)
MCHC: 34.7 g/dL (ref 30.0–36.0)
MCV: 86.3 fL (ref 78.0–100.0)
Platelets: 312 10*3/uL (ref 150–400)
RBC: 4.1 MIL/uL (ref 3.87–5.11)
RDW: 15.3 % (ref 11.5–15.5)
WBC: 14.9 10*3/uL — AB (ref 4.0–10.5)

## 2013-08-16 LAB — SALICYLATE LEVEL: Salicylate Lvl: <2 mg/dL — ABNORMAL LOW (ref 2.8–20.0)

## 2013-08-16 LAB — ACETAMINOPHEN LEVEL: Acetaminophen (Tylenol), Serum: <15 ug/mL (ref 10–30)

## 2013-08-16 LAB — ETHANOL

## 2013-08-16 MED ORDER — ALUM & MAG HYDROXIDE-SIMETH 200-200-20 MG/5ML PO SUSP: 30.0000 mL | ORAL | Status: DC | PRN

## 2013-08-16 MED ORDER — ONDANSETRON HCL 4 MG PO TABS: 4.0000 mg | ORAL_TABLET | Freq: Three times a day (TID) | ORAL | Status: DC | PRN

## 2013-08-16 MED ORDER — ZOLPIDEM TARTRATE 5 MG PO TABS: 5.0000 mg | ORAL_TABLET | Freq: Every evening | ORAL | Status: DC | PRN

## 2013-08-16 MED ORDER — DIVALPROEX SODIUM ER 500 MG PO TB24
500.0000 mg | ORAL_TABLET | ORAL | Status: AC
  Administered 2013-08-16: 500 mg via ORAL
  Filled 2013-08-16: qty 1

## 2013-08-16 MED ORDER — IBUPROFEN 200 MG PO TABS: 600.0000 mg | ORAL_TABLET | Freq: Three times a day (TID) | ORAL | Status: DC | PRN

## 2013-08-16 MED ORDER — DIVALPROEX SODIUM ER 500 MG PO TB24: 500.0000 mg | ORAL_TABLET | Freq: Two times a day (BID) | ORAL | Status: DC

## 2013-08-16 MED ORDER — NICOTINE 21 MG/24HR TD PT24: 21.0000 mg | MEDICATED_PATCH | Freq: Every day | TRANSDERMAL | Status: DC

## 2013-08-16 MED ORDER — LORAZEPAM 1 MG PO TABS: 1.0000 mg | ORAL_TABLET | Freq: Three times a day (TID) | ORAL | Status: DC | PRN

## 2013-08-16 MED ORDER — FLUPHENAZINE HCL 5 MG PO TABS
15.0000 mg | ORAL_TABLET | ORAL | Status: AC
  Administered 2013-08-16: 15 mg via ORAL
  Filled 2013-08-16: qty 1

## 2013-08-16 MED ORDER — FLUPHENAZINE HCL 5 MG PO TABS: 15.0000 mg | ORAL_TABLET | Freq: Two times a day (BID) | ORAL | Status: DC

## 2013-08-16 NOTE — ED Notes (Signed)
Writer asked pt if she was able to urinate at this time, pt sts NO she can't pee.

## 2013-08-16 NOTE — ED Provider Notes (Signed)
CSN: [id number]     Arrival date & time 08/16/13  0219 History   First MD Initiated Contact with Patient 08/16/13 0221     Chief Complaint  Patient presents with  . Medical Clearance    (Consider location/radiation/quality/duration/timing/severity/associated sxs/prior Treatment) HPI Comments: 28 year old female with a history of catatonic schizophrenia and depression presents to the emergency department by [employer] under IVC. IVC papers indicate that patient has been hostile and cursing at people all day. It also makes note that patient has been throwing things and "knocking holes in the wall". Patient endorses being off of her medication for 4 days secondary to cost. She states that she resumed her medication today. Patient denies SI/HI and alcohol use. She endorses smoking marijuana yesterday. Patient states that she has been [facility] of her psychiatric disorders.  The history is provided by the patient. No language interpreter was used.    Past Medical History  Diagnosis Date  . Schizophrenia   . CERVICITIS, GONOCOCCAL, History of 01/09/2007    Qualifier: History of  By: McDiarmid MD, Tawanna Coolerodd    . ATTENTION DEFICIT, W/O HYPERACTIVITY, History of 06/30/2006    Qualifier: History of  By: McDiarmid MD, Tawanna Coolerodd    . Depression   . 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 Dennie Bibleichard Larsen (Psych) At [facility] in  [place], [place]. Qualifier: Hospitalized for  By: McDiarmid MD, Tawanna Coolerodd    . 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 TOBACCO USER 02/08/2009    Qualifier: Diagnosis of  By: Knox Royaltydell, Erin    . 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 ASC-cannot exclude HGSIL on Pap 02/15/2012    ASC-US on 02/03/2012 pap (associated Trichomonas infection). No reflex HPV testing performed on specimen.  Patient informed that she will need  repeat Pap in one year.      . Diabetes mellitus     diet controlled  . Eczema   . Abnormal Pap smear    Past Surgical History  Procedure Laterality Date  . Incision and drainage      pilanodal cyst   Family History  Problem Relation Age of Onset  . Adopted: Yes  . Bipolar disorder Sister   . Alcohol abuse Brother   . Cancer Father   . Diabetes Mother    History  Substance Use Topics  . Smoking status: Current Every Day Smoker -- 0.50 packs/day for 10 years    Types: Cigarettes  . Smokeless tobacco: Never Used  . Alcohol Use: Yes     Comment: occ   OB History   Grav Para Term Preterm Abortions TAB SAB Ect Mult Living   1 1 1       1      Review of Systems  Psychiatric/Behavioral: Positive for behavioral problems and agitation.  All other systems reviewed and are negative.     Allergies  Review of patient's allergies indicates no known allergies.  Home Medications   Prior to Admission medications   Medication Sig Start Date End Date Taking? Authorizing Provider  benztropine (COGENTIN) 1 MG tablet Take 1 mg by mouth 2 (two) times daily.     Historical Provider, MD  clonazePAM (KLONOPIN) 1 MG tablet Take 1 mg by mouth at bedtime.    Historical Provider, MD  divalproex (DEPAKOTE ER)  500 MG 24 hr tablet Take 500-750 mg by mouth 2 (two) times daily. 500mg  in the morning 750 mg at bedtime    Historical Provider, MD  fluPHENAZine (PROLIXIN) 5 MG tablet Take 15 mg by mouth 2 (two) times daily.    Historical Provider, MD  HYDROcodone-acetaminophen (NORCO/VICODIN) 5-325 MG per tablet Take 1 tablet by mouth every 6 (six) hours as needed for moderate pain. 07/18/13   Jamesetta Orleanshristopher W Lawyer, PA-C  sulfamethoxazole-trimethoprim (SEPTRA DS) 800-160 MG per tablet Take 2 tablets by mouth 2 (two) times daily. 07/18/13   Jamesetta Orleanshristopher W Lawyer, PA-C   BP 122/98  Pulse 105  Temp(Src) 97.8 F (36.6 C) (Oral)  Resp 20  SpO2 100%  LMP 06/20/2013  Physical Exam  Nursing note and vitals  reviewed. Constitutional: She is oriented to person, place, and time. She appears well-developed and well-nourished. No distress.  HENT:  Head: Normocephalic and atraumatic.  Eyes: Conjunctivae and EOM are normal. No scleral icterus.  Neck: Normal range of motion.  Cardiovascular: Normal rate, regular rhythm and normal heart sounds.   Pulmonary/Chest: Effort normal and breath sounds normal. No respiratory distress. She has no wheezes. She has no rales.  Musculoskeletal: Normal range of motion.  Neurological: She is alert and oriented to person, place, and time. GCS eye subscore is 4. GCS verbal subscore is 5. GCS motor subscore is 6.  Patient answers questions appropriately and follows simple commands. Speech is goal oriented. She moves her extremities without ataxia.  Skin: Skin is warm and dry. No rash noted. She is not diaphoretic. No erythema. No pallor.  Psychiatric: Her speech is normal. Her mood appears anxious. She is agitated. Cognition and memory are normal. She expresses no homicidal and no suicidal ideation. She expresses no suicidal plans and no homicidal plans.    ED Course  Procedures (including critical care time) Labs Review Labs Reviewed  CBC - Abnormal; Notable for the following:    WBC 14.9 (*)    HCT 35.4 (*)    All other components within normal limits  COMPREHENSIVE METABOLIC PANEL - Abnormal; Notable for the following:    Sodium 136 (*)    Glucose, Bld 108 (*)    Total Bilirubin <0.2 (*)    All other components within normal limits  SALICYLATE LEVEL - Abnormal; Notable for the following:    Salicylate Lvl <2.0 (*)    All other components within normal limits  ACETAMINOPHEN LEVEL  ETHANOL  URINE RAPID DRUG SCREEN (HOSP PERFORMED)  POC URINE PREG, ED    Imaging Review No results found.   EKG Interpretation None      MDM   Final diagnoses:  SCHIZOPHRENIA, PARANOID, CHRONIC  Agitation  Non compliance w medication regimen    28 year old female  with history of catatonic schizophrenia presents to the emergency department for medical clearance. Patient under IVC hostile behavior. Her IVC papers, patient was throwing things and knocking holes in the wall at her day treatment program. Patient endorses a history of 4 days of medication noncompliance, but states she took all of her psychiatric medications this evening. She denies SI/HI as well as alcohol use. She endorses smoking marijuana yesterday.  TTS evaluation is currently pending as well as urinalysis; medical clearance pending. Patient signed out to oncoming midlevel provider at shift change to follow pending labs. Midlevel to medically clear, if appropriate, and disposition appropriately.   Filed Vitals:   08/16/13 0222  BP: 122/98  Pulse: 105  Temp: 97.8 F (36.6 C)  TempSrc: Oral  Resp: 20  SpO2: 100%     Antony Madura, PA-C 08/16/13 [phone]

## 2013-08-16 NOTE — Progress Notes (Signed)
CSW called pt case manager Maebel Burchette at [phone], [employer] day program to obtain collateral information. CSW had to leave message.   Byrd HesselbachKristen Margy Sumler, [name] [phone]  ED CSW 08/16/2013 937am

## 2013-08-16 NOTE — Consult Note (Signed)
[facility] Consult   Reason for Consult:  Aggressive Behavior Referring Physician:  EDP  Molly [name] is an 28 y.o. female. Total Time spent with patient: 45 minutes  Assessment: AXIS I:  Aggressive Behavior and history of Schizophrenia    AXIS II:  Deferred AXIS III:   Past Medical History  Diagnosis Date  . Schizophrenia   . CERVICITIS, GONOCOCCAL, History of 01/09/2007    Qualifier: History of  By: McDiarmid MD, Sherren Mocha    . ATTENTION DEFICIT, W/O HYPERACTIVITY, History of 06/30/2006    Qualifier: History of  By: McDiarmid MD, Sherren Mocha    . Depression   . 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 Henrene Dodge (Psych) At [facility] in  [place], [place]. Qualifier: Hospitalized for  By: McDiarmid MD, Sherren Mocha    . 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 TOBACCO USER 02/08/2009    Qualifier: Diagnosis of  By: Samara Snide    . 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 ASC-cannot exclude HGSIL on Pap 02/15/2012    ASC-US on 02/03/2012 pap (associated Trichomonas infection). No reflex HPV testing performed on specimen.  Patient informed that she will need repeat Pap in one year.      . Diabetes mellitus     diet controlled  . Eczema   . Abnormal Pap smear    AXIS IV:  other psychosocial or environmental problems AXIS V:  21-30 behavior considerably influenced by delusions or hallucinations OR serious impairment in judgment, communication OR inability to function in almost all areas  Plan:  No evidence of imminent risk to self or others at present.   Patient does not meet criteria for psychiatric inpatient admission. Supportive therapy provided about ongoing stressors. Discussed crisis plan, support from social network, calling 911, coming to the Emergency Department, and calling Suicide Hotline.  Subjective:   MANDI [name] is a 28 y.o. female patient.  HPI:  Patient states that she does not know why she is here at hospital.  "I was at home in bed when police came.  No I didn't do nothing; I did get in fight with nobody."  Patient sates that she an room mate had disagreement about neighbors coming over because she didn't want them there; but other than that she denies other alligations of aggressive behavior.   Patient denies suicidal/homicidal ideation, psychosis, and paranoia.  Patient is calm and cooperative today.  Consulted with SW who spoke with patients case manager Maebel Burchette at [phone], [employer] day program to obtain collateral information. CSW had to leave message and was informed that patient has been off of medications for 3 days related to being sent to wrong place but was started back on medications yesterday.     HPI Elements:   Location:  Aggressive behavior. Quality:  off of medications. Severity:  off of medications. Timing:  yesterday. Review of Systems  Gastrointestinal: Negative for nausea, vomiting, abdominal pain and diarrhea.  Musculoskeletal: Negative.   Psychiatric/Behavioral: Negative for depression (Denies), suicidal ideas (Denies), hallucinations (Denies) and substance abuse (Denies). The patient is not nervous/anxious (denies).    Past Psychiatric History: Past Medical History  Diagnosis Date  . Schizophrenia   . CERVICITIS, GONOCOCCAL, History of 01/09/2007    Qualifier: History of  By: McDiarmid MD, Sherren Mocha    .  ATTENTION DEFICIT, W/O HYPERACTIVITY, History of 06/30/2006    Qualifier: History of  By: McDiarmid MD, Sherren Mocha    . Depression   . 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 Henrene Dodge (Psych) At [facility] in  [place], [place]. Qualifier: Hospitalized for  By: McDiarmid MD, Sherren Mocha    . 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 TOBACCO USER 02/08/2009     Qualifier: Diagnosis of  By: Samara Snide    . 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 ASC-cannot exclude HGSIL on Pap 02/15/2012    ASC-US on 02/03/2012 pap (associated Trichomonas infection). No reflex HPV testing performed on specimen.  Patient informed that she will need repeat Pap in one year.      . Diabetes mellitus     diet controlled  . Eczema   . Abnormal Pap smear     reports that she has been smoking Cigarettes.  She has a 5 pack-year smoking history. She has never used smokeless tobacco. She reports that she drinks alcohol. She reports that she does not use illicit drugs. Family History  Problem Relation Age of Onset  . Adopted: Yes  . Bipolar disorder Sister   . Alcohol abuse Brother   . Cancer Father   . Diabetes Mother            Allergies:  No Known Allergies  ACT Assessment Complete:  Yes:    Educational Status    Risk to Self: Risk to self Is patient at risk for suicide?: Yes Substance abuse history and/or treatment for substance abuse?: Yes  Risk to Others:    Abuse:    Prior Inpatient Therapy:    Prior Outpatient Therapy:    Additional Information:     Objective: Blood pressure 106/75, pulse 90, temperature 97.5 F (36.4 C), temperature source Oral, resp. rate 18, last menstrual period 06/20/2013, SpO2 99.00%.There is no weight on file to calculate BMI. Results for orders placed during the hospital encounter of 08/16/13 (from the past 72 hour(s))  ACETAMINOPHEN LEVEL     Status: None   Collection Time    08/16/13  3:05 AM      Result Value Ref Range   Acetaminophen (Tylenol), Serum <15.0  10 - 30 ug/mL   Comment:            THERAPEUTIC CONCENTRATIONS VARY     SIGNIFICANTLY. A RANGE OF 10-30     ug/mL MAY BE AN EFFECTIVE     CONCENTRATION FOR MANY PATIENTS.     HOWEVER, SOME ARE BEST TREATED     AT CONCENTRATIONS OUTSIDE THIS     RANGE.     ACETAMINOPHEN CONCENTRATIONS     >150 ug/mL AT 4  HOURS AFTER     INGESTION AND >50 ug/mL AT 12     HOURS AFTER INGESTION ARE     OFTEN ASSOCIATED WITH TOXIC     REACTIONS.  CBC     Status: Abnormal   Collection Time    08/16/13  3:05 AM      Result Value Ref Range   WBC 14.9 (*) 4.0 - 10.5 K/uL   RBC 4.10  3.87 - 5.11 MIL/uL   Hemoglobin 12.3  12.0 - 15.0 g/dL   HCT 35.4 (*) 36.0 - 46.0 %   MCV 86.3  78.0 - 100.0 fL   MCH 30.0  26.0 - 34.0 pg   MCHC 34.7  30.0 - 36.0 g/dL   RDW 15.3  11.5 - 15.5 %   Platelets 312  150 - 400 K/uL  COMPREHENSIVE METABOLIC PANEL     Status: Abnormal   Collection Time    08/16/13  3:05 AM      Result Value Ref Range   Sodium 136 (*) 137 - 147 mEq/L   Potassium 3.8  3.7 - 5.3 mEq/L   Chloride 99  96 - 112 mEq/L   CO2 25  19 - 32 mEq/L   Glucose, Bld 108 (*) 70 - 99 mg/dL   BUN 9  6 - 23 mg/dL   Creatinine, Ser 0.76  0.50 - 1.10 mg/dL   Calcium 9.5  8.4 - 10.5 mg/dL   Total Protein 7.9  6.0 - 8.3 g/dL   Albumin 3.9  3.5 - 5.2 g/dL   AST 22  0 - 37 U/L   ALT 19  0 - 35 U/L   Alkaline Phosphatase 89  39 - 117 U/L   Total Bilirubin <0.2 (*) 0.3 - 1.2 mg/dL   GFR calc non Af Amer >90  >90 mL/min   GFR calc Af Amer >90  >90 mL/min   Comment: (NOTE)     The eGFR has been calculated using the CKD EPI equation.     This calculation has not been validated in all clinical situations.     eGFR's persistently <90 mL/min signify possible Chronic Kidney     Disease.  ETHANOL     Status: None   Collection Time    08/16/13  3:05 AM      Result Value Ref Range   Alcohol, Ethyl (B) <11  0 - 11 mg/dL   Comment:            LOWEST DETECTABLE LIMIT FOR     SERUM ALCOHOL IS 11 mg/dL     FOR MEDICAL PURPOSES ONLY  SALICYLATE LEVEL     Status: Abnormal   Collection Time    08/16/13  3:05 AM      Result Value Ref Range   Salicylate Lvl <4.1 (*) 2.8 - 20.0 mg/dL   Labs are reviewed and no critical values noted.  Medications reviewed and no changes made.  Current Facility-Administered Medications   Medication Dose Route Frequency Provider Last Rate Last Dose  . alum & mag hydroxide-simeth (MAALOX/MYLANTA) 200-200-20 MG/5ML suspension 30 mL  30 mL Oral PRN Antonietta Breach, PA-C      . ibuprofen (ADVIL,MOTRIN) tablet 600 mg  600 mg Oral Q8H PRN Antonietta Breach, PA-C      . LORazepam (ATIVAN) tablet 1 mg  1 mg Oral Q8H PRN Antonietta Breach, PA-C      . nicotine (NICODERM CQ - dosed in mg/24 hours) patch 21 mg  21 mg Transdermal Daily Antonietta Breach, PA-C      . ondansetron [facility]) tablet 4 mg  4 mg Oral Q8H PRN Antonietta Breach, PA-C      . zolpidem (AMBIEN) tablet 5 mg  5 mg Oral QHS PRN Antonietta Breach, PA-C       Current Outpatient Prescriptions  Medication Sig Dispense Refill  . clonazePAM (KLONOPIN) 1 MG tablet Take 1 mg by mouth at bedtime.      . divalproex (DEPAKOTE ER) 500 MG 24 hr tablet Take 500 mg by mouth 2 (two) times daily.       . fluPHENAZine (  PROLIXIN) 5 MG tablet Take 15 mg by mouth 2 (two) times daily.      . Prenatal Vit-Fe Fumarate-FA (MULTIVITAMIN-PRENATAL) 27-0.8 MG TABS tablet Take 1 tablet by mouth daily at 12 noon.      . triamcinolone cream (KENALOG) 0.5 % Apply 1 application topically 3 (three) times daily.        Psychiatric Specialty Exam:     Blood pressure 106/75, pulse 90, temperature 97.5 F (36.4 C), temperature source Oral, resp. rate 18, last menstrual period 06/20/2013, SpO2 99.00%.There is no weight on file to calculate BMI.  General Appearance: Casual  Eye Contact::  Fair  Speech:  Clear and Coherent and Normal Rate  Volume:  Decreased  Mood:  "Sleepy"  Affect:  Congruent  Thought Process:  Circumstantial  Orientation:  Full (Time, Place, and Person)  Thought Content:  "I don't know why I'm here; I didn't do nothing"  Suicidal Thoughts:  No  Homicidal Thoughts:  No  Memory:  Immediate;   Fair Recent;   Fair  Judgement:  Fair  Insight:  Present  Psychomotor Activity:  Normal  Concentration:  Fair  Recall:  [employer] [place]  Language: Fair   Akathisia:  No  Handed:  Right  AIMS (if indicated):     Assets:  Desire for Improvement Housing  Sleep:      Musculoskeletal: Strength & Muscle Tone: within normal limits Gait & Station: normal Patient leans: N/A  Treatment Plan Summary: Follow up with outpatient psych. provider  Disposition:  Discharge home.  Patient to follow up with her primary outpatient psych provider.    Discharge Assessment     Demographic Factors:  Black female  Total Time spent with patient: 15 minutes  Psychiatric Specialty Exam: Same as above  Musculoskeletal: Same as above  Mental Status Per Nursing Assessment::   On Admission:     Current Mental Status by Physician: Patient denies suicidal/homicidal ideation, psychosis, and paranoia  Loss Factors: NA  Historical Factors: NA  Risk Reduction Factors:   Positive social support and Positive therapeutic relationship  Continued Clinical Symptoms:  Previous Psychiatric Diagnoses and Treatments  Cognitive Features That Contribute To Risk:  Loss of executive function    Suicide Risk:  Minimal: No identifiable suicidal ideation.  Patients presenting with no risk factors but with morbid ruminations; may be classified as minimal risk based on the severity of the depressive symptoms  Discharge Diagnoses: Same as above  Plan Of Care/Follow-up recommendations:  Activity:  Resume usual activity Diet:  Resume usual diet  Is patient on multiple antipsychotic therapies at discharge:  No   Has Patient had three or more failed trials of antipsychotic monotherapy by history:  No  Recommended Plan for Multiple Antipsychotic Therapies: NA   Kaisha Wachob, FNP-BC 08/16/2013 11:00 AM

## 2013-08-16 NOTE — ED Notes (Signed)
Pt brought to ER under IVC by [employer]; per IVC pt has been out of medications x 4 days; pt has been hostile and cursing people all day; pt also has been throwing things and knocking holes in the wall; pt is non-cooperative; pt uncooperative upon arrival

## 2013-08-16 NOTE — Discharge Instructions (Signed)
Aggression Physically aggressive behavior is common among small children. When frustrated or angry, toddlers may act out. Often, they will push, bite, or hit. Most children show less physical aggression as they grow up. Their language and interpersonal skills improve, too. But continued aggressive behavior is a sign of a problem. This behavior can lead to aggression and delinquency in adolescence and adulthood. Aggressive behavior can be psychological or physical. Forms of psychological aggression include threatening or bullying others. Forms of physical aggression include:  Pushing.  Hitting.  Slapping.  Kicking.  Stabbing.  Shooting.  Raping. PREVENTION  Encouraging the following behaviors can help manage aggression:  Respecting others and valuing differences.  Participating in school and community functions, including sports, music, after-school programs, community groups, and volunteer work.  Talking with an adult when they are sad, depressed, fearful, anxious, or angry. Discussions with a parent or other family member, Veterinary surgeoncounselor, Runner, broadcasting/film/videoteacher, or coach can help.  Avoiding alcohol and drug use.  Dealing with disagreements without aggression, such as conflict resolution. To learn this, children need parents and caregivers to model respectful communication and problem solving.  Limiting exposure to aggression and violence, such as video games that are not age appropriate, violence in the media, or domestic violence. Document Released: 02/14/2007 Document Revised: 07/12/2011 Document Reviewed: 06/25/2010 [facility] Patient Information 2014 [place], [place].  Schizophrenia Schizophrenia is a mental illness. It may cause disturbed or disorganized thinking, speech, or behavior. People with schizophrenia have problems functioning in one or more areas of life: work, school, home, or relationships. People with schizophrenia are at increased risk for suicide, certain chronic physical  illnesses, and unhealthy behaviors, such as smoking and drug use. People who have family members with schizophrenia are at higher risk of developing the illness. Schizophrenia affects men and women equally but usually appears at an earlier age (teenage or early adult years) in men.  SYMPTOMS The earliest symptoms are often subtle (prodrome) and may go unnoticed until the illness becomes more severe (first-break psychosis). Symptoms of schizophrenia may be continuous or may come and go in severity. Episodes often are triggered by major life events, such as family stress, college, PepsiComilitary service, marriage, pregnancy or child birth, divorce, or loss of a loved one. People with schizophrenia may see, hear, or feel things that do not exist (hallucinations). They may have false beliefs in spite of obvious proof to the contrary (delusions). Sometimes speech is incoherent or behavior is odd or withdrawn.  DIAGNOSIS Schizophrenia is diagnosed through an assessment by your caregiver. Your caregiver will ask questions about your thoughts, behavior, mood, and ability to function in daily life. Your caregiver may ask questions about your medical history and use of alcohol or drugs, including prescription medication. Your caregiver may also order blood tests and imaging exams. Certain medical conditions and substances can cause symptoms that resemble schizophrenia. Your caregiver may refer you to a mental health specialist for evaluation. There are three major criterion for a diagnosis of schizophrenia:  Two or more of the following five symptoms are present for a month or longer:  Delusions. Often the delusions are that you are being attacked, harassed, cheated, persecuted or conspired against (persecutory delusions).  Hallucinations.   Disorganized speech that does not make sense to others.  Grossly disorganized (confused or unfocused) behavior or extremely overactive or underactive motor activity  (catatonia).  Negative symptoms such as bland or blunted emotions (flat affect), loss of will power (avolition), and withdrawal from social contacts (social isolation).  Level of functioning  in one or more major areas of life (work, school, relationships, or self-care) is markedly below the level of functioning before the onset of illness.   There are continuous signs of illness (either mild symptoms or decreased level of functioning) for at least 6 months or longer. TREATMENT  Schizophrenia is a long-term illness. It is best controlled with continuous treatment rather than treatment only when symptoms occur. The following treatments are used to manage schizophrenia:  Medication Medication is the most effective and important form of treatment for schizophrenia. Antipsychotic medications are usually prescribed to help manage schizophrenia. Other types of medication may be added to relieve any symptoms that may occur despite the use of antipsychotic medications.  Counseling or talk therapy Individual, group, or family counseling may be helpful in providing education, support, and guidance. Many people with schizophrenia also benefit from social skills and job skills (vocational) training. A combination of medication and counseling is best for managing the disorder over time. A procedure in which electricity is applied to the brain through the scalp (electroconvulsive therapy) may be used to treat catatonic schizophrenia or schizophrenia in people who cannot take or do not respond to medication and counseling. Document Released: 04/16/2000 Document Revised: 12/20/2012 Document Reviewed: 07/12/2012 [facility] Patient Information 2014 [place], [place].

## 2013-08-16 NOTE — ED Provider Notes (Signed)
6:06 AM Pt signed out to myself by Antony MaduraKelly Humes, PA-C at shift change.  TTS screening and UA/urine drug screen still pending for medical clearance.  UA never completed, pt was evaluated by psychiatry and discharged home advised to f/u with outpatient psychiatry.    Junius Finnerrin O'Malley, PA-C 08/16/13 1521

## 2013-08-16 NOTE — ED Provider Notes (Signed)
Medical screening examination/treatment/procedure(s) were performed by non-physician practitioner and as supervising physician I was immediately available for consultation/collaboration.   EKG Interpretation None       Olivia Mackielga M Kaydn Kumpf, MD 08/16/13 2105

## 2013-08-16 NOTE — Consult Note (Signed)
Face to face  Evaluation and I agree with this note 

## 2013-08-16 NOTE — ED Provider Notes (Signed)
Medical screening examination/treatment/procedure(s) were performed by non-physician practitioner and as supervising physician I was immediately available for consultation/collaboration.   EKG Interpretation None       Olivia Mackielga M Delila Kuklinski, MD 08/16/13 [phone]
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
## 2013-09-11 ENCOUNTER — Inpatient Hospital Stay: Payer: Self-pay | Admitting: Psychiatry

## 2013-09-11 DIAGNOSIS — Z91199 Patient's noncompliance with other medical treatment and regimen due to unspecified reason: Secondary | ICD-10-CM | POA: Diagnosis not present

## 2013-09-11 DIAGNOSIS — F209 Schizophrenia, unspecified: Secondary | ICD-10-CM | POA: Diagnosis not present

## 2013-09-11 DIAGNOSIS — E669 Obesity, unspecified: Secondary | ICD-10-CM | POA: Diagnosis present

## 2013-09-11 DIAGNOSIS — Z6841 Body Mass Index (BMI) 40.0 and over, adult: Secondary | ICD-10-CM | POA: Diagnosis not present

## 2013-09-11 DIAGNOSIS — Z9119 Patient's noncompliance with other medical treatment and regimen: Secondary | ICD-10-CM | POA: Diagnosis not present

## 2013-09-11 DIAGNOSIS — F259 Schizoaffective disorder, unspecified: Secondary | ICD-10-CM | POA: Diagnosis not present

## 2013-09-11 DIAGNOSIS — F319 Bipolar disorder, unspecified: Secondary | ICD-10-CM | POA: Diagnosis present

## 2013-09-11 DIAGNOSIS — G4700 Insomnia, unspecified: Secondary | ICD-10-CM | POA: Diagnosis present

## 2013-09-11 LAB — CBC
HCT: 34.9 % — ABNORMAL LOW (ref 35.0–47.0)
HGB: 11.6 g/dL — AB (ref 12.0–16.0)
MCH: 29.8 pg (ref 26.0–34.0)
MCHC: 33.4 g/dL (ref 32.0–36.0)
MCV: 89 fL (ref 80–100)
PLATELETS: 301 10*3/uL (ref 150–440)
RBC: 3.91 10*6/uL (ref 3.80–5.20)
RDW: 16.4 % — ABNORMAL HIGH (ref 11.5–14.5)
WBC: 10.2 10*3/uL (ref 3.6–11.0)

## 2013-09-11 LAB — COMPREHENSIVE METABOLIC PANEL
ALBUMIN: 3.2 g/dL — AB (ref 3.4–5.0)
ALK PHOS: 97 U/L
ANION GAP: 6 — AB (ref 7–16)
BUN: 11 mg/dL (ref 7–18)
Bilirubin,Total: 0.2 mg/dL (ref 0.2–1.0)
CALCIUM: 9 mg/dL (ref 8.5–10.1)
CHLORIDE: 108 mmol/L — AB (ref 98–107)
Co2: 28 mmol/L (ref 21–32)
Creatinine: 0.99 mg/dL (ref 0.60–1.30)
EGFR (African American): >60
Glucose: 91 mg/dL (ref 65–99)
OSMOLALITY: 282 (ref 275–301)
POTASSIUM: 4.5 mmol/L (ref 3.5–5.1)
SGOT(AST): 33 U/L (ref 15–37)
SGPT (ALT): 27 U/L (ref 12–78)
SODIUM: 142 mmol/L (ref 136–145)
Total Protein: 7.3 g/dL (ref 6.4–8.2)

## 2013-09-11 LAB — VALPROIC ACID LEVEL: Valproic Acid: 10 ug/mL — ABNORMAL LOW

## 2013-09-16 LAB — COMPREHENSIVE METABOLIC PANEL
ALT: 21 U/L (ref 12–78)
AST: 14 U/L — AB (ref 15–37)
Albumin: 2.9 g/dL — ABNORMAL LOW (ref 3.4–5.0)
Alkaline Phosphatase: 98 U/L
Anion Gap: 6 — ABNORMAL LOW (ref 7–16)
BILIRUBIN TOTAL: 0.2 mg/dL (ref 0.2–1.0)
BUN: 7 mg/dL (ref 7–18)
CHLORIDE: 104 mmol/L (ref 98–107)
CO2: 27 mmol/L (ref 21–32)
Calcium, Total: 8.5 mg/dL (ref 8.5–10.1)
Creatinine: 0.67 mg/dL (ref 0.60–1.30)
EGFR (African American): >60
EGFR (Non-African Amer.): >60
Glucose: 89 mg/dL (ref 65–99)
OSMOLALITY: 271 (ref 275–301)
Potassium: 4.2 mmol/L (ref 3.5–5.1)
Sodium: 137 mmol/L (ref 136–145)
TOTAL PROTEIN: 7.3 g/dL (ref 6.4–8.2)

## 2013-09-16 LAB — DRUG SCREEN, URINE
AMPHETAMINES, UR SCREEN: NEGATIVE (ref ?–1000)
BARBITURATES, UR SCREEN: NEGATIVE (ref ?–200)
BENZODIAZEPINE, UR SCRN: NEGATIVE (ref ?–200)
CANNABINOID 50 NG, UR ~~LOC~~: NEGATIVE (ref ?–50)
COCAINE METABOLITE, UR ~~LOC~~: NEGATIVE (ref ?–300)
MDMA (Ecstasy)Ur Screen: NEGATIVE (ref ?–500)
Methadone, Ur Screen: NEGATIVE (ref ?–300)
Opiate, Ur Screen: NEGATIVE (ref ?–300)
Phencyclidine (PCP) Ur S: NEGATIVE (ref ?–25)
Tricyclic, Ur Screen: NEGATIVE (ref ?–1000)

## 2013-09-16 LAB — URINALYSIS, COMPLETE
Bacteria: NONE SEEN
Bilirubin,UR: NEGATIVE
Blood: NEGATIVE
Glucose,UR: NEGATIVE mg/dL (ref 0–75)
KETONE: NEGATIVE
LEUKOCYTE ESTERASE: NEGATIVE
Nitrite: NEGATIVE
Ph: 7 (ref 4.5–8.0)
Protein: NEGATIVE
RBC, UR: NONE SEEN /HPF (ref 0–5)
Specific Gravity: 1.015 (ref 1.003–1.030)
Squamous Epithelial: 1

## 2013-09-16 LAB — LIPID PANEL
Cholesterol: 135 mg/dL (ref 0–200)
HDL Cholesterol: 36 mg/dL — ABNORMAL LOW (ref 40–60)
LDL CHOLESTEROL, CALC: 85 mg/dL (ref 0–100)
TRIGLYCERIDES: 71 mg/dL (ref 0–200)
VLDL CHOLESTEROL, CALC: 14 mg/dL (ref 5–40)

## 2013-09-16 LAB — VALPROIC ACID LEVEL: Valproic Acid: 61 ug/mL

## 2013-09-16 LAB — PREGNANCY, URINE: Pregnancy Test, Urine: NEGATIVE m[IU]/mL

## 2013-09-24 LAB — VALPROIC ACID LEVEL: Valproic Acid: 63 ug/mL

## 2013-10-12 DIAGNOSIS — F209 Schizophrenia, unspecified: Secondary | ICD-10-CM | POA: Diagnosis not present

## 2013-11-06 DIAGNOSIS — F209 Schizophrenia, unspecified: Secondary | ICD-10-CM | POA: Diagnosis not present

## 2013-11-07 DIAGNOSIS — F209 Schizophrenia, unspecified: Secondary | ICD-10-CM | POA: Diagnosis not present

## 2013-11-16 ENCOUNTER — Encounter: Payer: Medicare Other | Admitting: Family Medicine

## 2013-12-04 DIAGNOSIS — F332 Major depressive disorder, recurrent severe without psychotic features: Secondary | ICD-10-CM | POA: Diagnosis not present

## 2013-12-05 ENCOUNTER — Encounter (HOSPITAL_COMMUNITY): Payer: Self-pay | Admitting: Emergency Medicine

## 2013-12-05 ENCOUNTER — Emergency Department (HOSPITAL_COMMUNITY)
Admission: EM | Admit: 2013-12-05 | Discharge: 2013-12-05 | Disposition: A | Discharge status: Home / Self Care | Payer: Medicare Other | Attending: Emergency Medicine | Admitting: Emergency Medicine

## 2013-12-05 DIAGNOSIS — Z79899 Other long term (current) drug therapy: Secondary | ICD-10-CM | POA: Insufficient documentation

## 2013-12-05 DIAGNOSIS — F17200 Nicotine dependence, unspecified, uncomplicated: Secondary | ICD-10-CM | POA: Insufficient documentation

## 2013-12-05 DIAGNOSIS — F332 Major depressive disorder, recurrent severe without psychotic features: Secondary | ICD-10-CM | POA: Diagnosis not present

## 2013-12-05 DIAGNOSIS — Z008 Encounter for other general examination: Secondary | ICD-10-CM | POA: Insufficient documentation

## 2013-12-05 DIAGNOSIS — Z3202 Encounter for pregnancy test, result negative: Secondary | ICD-10-CM | POA: Insufficient documentation

## 2013-12-05 DIAGNOSIS — E119 Type 2 diabetes mellitus without complications: Secondary | ICD-10-CM | POA: Insufficient documentation

## 2013-12-05 DIAGNOSIS — S0990XA Unspecified injury of head, initial encounter: Secondary | ICD-10-CM | POA: Diagnosis not present

## 2013-12-05 DIAGNOSIS — Z872 Personal history of diseases of the skin and subcutaneous tissue: Secondary | ICD-10-CM | POA: Diagnosis not present

## 2013-12-05 DIAGNOSIS — Z8619 Personal history of other infectious and parasitic diseases: Secondary | ICD-10-CM | POA: Diagnosis not present

## 2013-12-05 DIAGNOSIS — F329 Major depressive disorder, single episode, unspecified: Secondary | ICD-10-CM | POA: Insufficient documentation

## 2013-12-05 DIAGNOSIS — F3289 Other specified depressive episodes: Secondary | ICD-10-CM | POA: Insufficient documentation

## 2013-12-05 DIAGNOSIS — F988 Other specified behavioral and emotional disorders with onset usually occurring in childhood and adolescence: Secondary | ICD-10-CM | POA: Diagnosis not present

## 2013-12-05 LAB — RAPID URINE DRUG SCREEN, HOSP PERFORMED
AMPHETAMINES: NOT DETECTED
BENZODIAZEPINES: NOT DETECTED
Barbiturates: NOT DETECTED
Cocaine: NOT DETECTED
Opiates: NOT DETECTED
Tetrahydrocannabinol: NOT DETECTED

## 2013-12-05 LAB — I-STAT CHEM 8, ED
BUN: 6 mg/dL (ref 6–23)
CHLORIDE: 104 meq/L (ref 96–112)
Calcium, Ion: 1.12 mmol/L (ref 1.12–1.23)
Creatinine, Ser: 0.9 mg/dL (ref 0.50–1.10)
Glucose, Bld: 123 mg/dL — ABNORMAL HIGH (ref 70–99)
HCT: 38 % (ref 36.0–46.0)
Hemoglobin: 12.9 g/dL (ref 12.0–15.0)
Potassium: 3.3 mEq/L — ABNORMAL LOW (ref 3.7–5.3)
Sodium: 138 mEq/L (ref 137–147)
TCO2: 22 mmol/L (ref 0–100)

## 2013-12-05 LAB — CBC WITH DIFFERENTIAL/PLATELET
BASOS ABS: 0 10*3/uL (ref 0.0–0.1)
Basophils Relative: 0 % (ref 0–1)
Eosinophils Absolute: 0.1 10*3/uL (ref 0.0–0.7)
Eosinophils Relative: 1 % (ref 0–5)
HCT: 33.6 % — ABNORMAL LOW (ref 36.0–46.0)
Hemoglobin: 11.6 g/dL — ABNORMAL LOW (ref 12.0–15.0)
LYMPHS PCT: 18 % (ref 12–46)
Lymphs Abs: 1.9 10*3/uL (ref 0.7–4.0)
MCH: 29.7 pg (ref 26.0–34.0)
MCHC: 34.5 g/dL (ref 30.0–36.0)
MCV: 85.9 fL (ref 78.0–100.0)
Monocytes Absolute: 1 10*3/uL (ref 0.1–1.0)
Monocytes Relative: 9 % (ref 3–12)
NEUTROS ABS: 7.4 10*3/uL (ref 1.7–7.7)
Neutrophils Relative %: 72 % (ref 43–77)
PLATELETS: 291 10*3/uL (ref 150–400)
RBC: 3.91 MIL/uL (ref 3.87–5.11)
RDW: 15.1 % (ref 11.5–15.5)
WBC: 10.3 10*3/uL (ref 4.0–10.5)

## 2013-12-05 LAB — PREGNANCY, URINE: PREG TEST UR: NEGATIVE

## 2013-12-05 LAB — ETHANOL: Alcohol, Ethyl (B): <11 mg/dL (ref 0–11)

## 2013-12-05 MED ORDER — POTASSIUM CHLORIDE CRYS ER 20 MEQ PO TBCR
20.0000 meq | EXTENDED_RELEASE_TABLET | Freq: Once | ORAL | Status: AC
  Administered 2013-12-05: 20 meq via ORAL
  Filled 2013-12-05: qty 1
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
## 2013-12-05 NOTE — ED Notes (Signed)
Pt has been asked several times to return to her room and is refusing.   Security has been notified.

## 2013-12-05 NOTE — ED Notes (Signed)
Pt standing at nurses' station saying she needs to leave.
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
## 2013-12-05 NOTE — Discharge Instructions (Signed)
Medical Screening Exam °A medical screening exam has been done. This exam helps find the cause of your problem and determines whether you need emergency treatment. Your exam has shown that you do not need emergency treatment at this point. It is safe for you to go to your caregiver's office or clinic for treatment. You should make an appointment today to see your caregiver as soon as he or she is available. °Depending on your illness, your symptoms and condition can change over time. If your condition gets worse or you develop new or troubling symptoms before you see your caregiver, you should return to the emergency department for further evaluation.  °Document Released: 05/27/2004 Document Revised: 07/12/2011 Document Reviewed: 01/06/2011 °ExitCare® Patient Information ©2015 Exit[employer], LLC. This information is not intended to replace advice given to you by your health care provider. Make sure you discuss any questions you have with your health care provider. ° °

## 2013-12-05 NOTE — ED Notes (Signed)
Pt has sandwich and is in her room eating.

## 2013-12-05 NOTE — ED Provider Notes (Signed)
Medical screening examination/treatment/procedure(s) were performed by non-physician practitioner and as supervising physician I was immediately available for consultation/collaboration.   EKG Interpretation None        Loren Raceravid Anjel Perfetti, MD 12/05/13 [phone]
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
## 2013-12-05 NOTE — ED Notes (Signed)
[facility]  Called and notified it had been sent

## 2013-12-06 DIAGNOSIS — F332 Major depressive disorder, recurrent severe without psychotic features: Secondary | ICD-10-CM | POA: Diagnosis not present

## 2013-12-07 DIAGNOSIS — F332 Major depressive disorder, recurrent severe without psychotic features: Secondary | ICD-10-CM | POA: Diagnosis not present

## 2013-12-08 DIAGNOSIS — F332 Major depressive disorder, recurrent severe without psychotic features: Secondary | ICD-10-CM | POA: Diagnosis not present

## 2013-12-09 DIAGNOSIS — F332 Major depressive disorder, recurrent severe without psychotic features: Secondary | ICD-10-CM | POA: Diagnosis not present

## 2013-12-10 DIAGNOSIS — F332 Major depressive disorder, recurrent severe without psychotic features: Secondary | ICD-10-CM | POA: Diagnosis not present

## 2013-12-11 DIAGNOSIS — F332 Major depressive disorder, recurrent severe without psychotic features: Secondary | ICD-10-CM | POA: Diagnosis not present

## 2013-12-12 DIAGNOSIS — F332 Major depressive disorder, recurrent severe without psychotic features: Secondary | ICD-10-CM | POA: Diagnosis not present

## 2013-12-13 DIAGNOSIS — F332 Major depressive disorder, recurrent severe without psychotic features: Secondary | ICD-10-CM | POA: Diagnosis not present

## 2013-12-14 DIAGNOSIS — F332 Major depressive disorder, recurrent severe without psychotic features: Secondary | ICD-10-CM | POA: Diagnosis not present

## 2013-12-15 DIAGNOSIS — F332 Major depressive disorder, recurrent severe without psychotic features: Secondary | ICD-10-CM | POA: Diagnosis not present

## 2013-12-16 DIAGNOSIS — F332 Major depressive disorder, recurrent severe without psychotic features: Secondary | ICD-10-CM | POA: Diagnosis not present

## 2013-12-17 DIAGNOSIS — F332 Major depressive disorder, recurrent severe without psychotic features: Secondary | ICD-10-CM | POA: Diagnosis not present

## 2013-12-18 DIAGNOSIS — F332 Major depressive disorder, recurrent severe without psychotic features: Secondary | ICD-10-CM | POA: Diagnosis not present

## 2013-12-19 DIAGNOSIS — F332 Major depressive disorder, recurrent severe without psychotic features: Secondary | ICD-10-CM | POA: Diagnosis not present

## 2013-12-20 DIAGNOSIS — F332 Major depressive disorder, recurrent severe without psychotic features: Secondary | ICD-10-CM | POA: Diagnosis not present

## 2013-12-21 DIAGNOSIS — F332 Major depressive disorder, recurrent severe without psychotic features: Secondary | ICD-10-CM | POA: Diagnosis not present

## 2013-12-22 DIAGNOSIS — F332 Major depressive disorder, recurrent severe without psychotic features: Secondary | ICD-10-CM | POA: Diagnosis not present

## 2013-12-23 DIAGNOSIS — F332 Major depressive disorder, recurrent severe without psychotic features: Secondary | ICD-10-CM | POA: Diagnosis not present

## 2013-12-24 DIAGNOSIS — F332 Major depressive disorder, recurrent severe without psychotic features: Secondary | ICD-10-CM | POA: Diagnosis not present

## 2013-12-25 DIAGNOSIS — F332 Major depressive disorder, recurrent severe without psychotic features: Secondary | ICD-10-CM | POA: Diagnosis not present

## 2013-12-26 DIAGNOSIS — F332 Major depressive disorder, recurrent severe without psychotic features: Secondary | ICD-10-CM | POA: Diagnosis not present

## 2013-12-27 DIAGNOSIS — F332 Major depressive disorder, recurrent severe without psychotic features: Secondary | ICD-10-CM | POA: Diagnosis not present

## 2013-12-28 DIAGNOSIS — F332 Major depressive disorder, recurrent severe without psychotic features: Secondary | ICD-10-CM | POA: Diagnosis not present

## 2013-12-29 DIAGNOSIS — F332 Major depressive disorder, recurrent severe without psychotic features: Secondary | ICD-10-CM | POA: Diagnosis not present

## 2013-12-30 DIAGNOSIS — F332 Major depressive disorder, recurrent severe without psychotic features: Secondary | ICD-10-CM | POA: Diagnosis not present

## 2013-12-31 DIAGNOSIS — F332 Major depressive disorder, recurrent severe without psychotic features: Secondary | ICD-10-CM | POA: Diagnosis not present

## 2014-01-01 DIAGNOSIS — F332 Major depressive disorder, recurrent severe without psychotic features: Secondary | ICD-10-CM | POA: Diagnosis not present

## 2014-01-02 DIAGNOSIS — F332 Major depressive disorder, recurrent severe without psychotic features: Secondary | ICD-10-CM | POA: Diagnosis not present

## 2014-01-03 DIAGNOSIS — F332 Major depressive disorder, recurrent severe without psychotic features: Secondary | ICD-10-CM | POA: Diagnosis not present

## 2014-01-04 DIAGNOSIS — F332 Major depressive disorder, recurrent severe without psychotic features: Secondary | ICD-10-CM | POA: Diagnosis not present

## 2014-01-05 DIAGNOSIS — F332 Major depressive disorder, recurrent severe without psychotic features: Secondary | ICD-10-CM | POA: Diagnosis not present

## 2014-01-06 DIAGNOSIS — F332 Major depressive disorder, recurrent severe without psychotic features: Secondary | ICD-10-CM | POA: Diagnosis not present

## 2014-01-07 DIAGNOSIS — F332 Major depressive disorder, recurrent severe without psychotic features: Secondary | ICD-10-CM | POA: Diagnosis not present

## 2014-01-08 DIAGNOSIS — F332 Major depressive disorder, recurrent severe without psychotic features: Secondary | ICD-10-CM | POA: Diagnosis not present

## 2014-01-09 DIAGNOSIS — F332 Major depressive disorder, recurrent severe without psychotic features: Secondary | ICD-10-CM | POA: Diagnosis not present

## 2014-01-10 DIAGNOSIS — F332 Major depressive disorder, recurrent severe without psychotic features: Secondary | ICD-10-CM | POA: Diagnosis not present

## 2014-01-15 ENCOUNTER — Ambulatory Visit: Payer: Medicare Other | Admitting: Family Medicine

## 2014-01-18 ENCOUNTER — Encounter (HOSPITAL_COMMUNITY): Payer: Self-pay | Admitting: Emergency Medicine

## 2014-01-18 ENCOUNTER — Emergency Department (HOSPITAL_COMMUNITY)
Admission: EM | Admit: 2014-01-18 | Discharge: 2014-01-19 | Disposition: A | Discharge status: Home / Self Care | Payer: Medicare Other | Attending: Emergency Medicine | Admitting: Emergency Medicine

## 2014-01-18 DIAGNOSIS — Z872 Personal history of diseases of the skin and subcutaneous tissue: Secondary | ICD-10-CM | POA: Insufficient documentation

## 2014-01-18 DIAGNOSIS — O99891 Other specified diseases and conditions complicating pregnancy: Secondary | ICD-10-CM | POA: Diagnosis not present

## 2014-01-18 DIAGNOSIS — Z8619 Personal history of other infectious and parasitic diseases: Secondary | ICD-10-CM | POA: Insufficient documentation

## 2014-01-18 DIAGNOSIS — F259 Schizoaffective disorder, unspecified: Secondary | ICD-10-CM | POA: Diagnosis present

## 2014-01-18 DIAGNOSIS — Z3202 Encounter for pregnancy test, result negative: Secondary | ICD-10-CM | POA: Insufficient documentation

## 2014-01-18 DIAGNOSIS — F528 Other sexual dysfunction not due to a substance or known physiological condition: Secondary | ICD-10-CM | POA: Insufficient documentation

## 2014-01-18 DIAGNOSIS — Z79899 Other long term (current) drug therapy: Secondary | ICD-10-CM | POA: Insufficient documentation

## 2014-01-18 DIAGNOSIS — F6589 Other paraphilias: Secondary | ICD-10-CM | POA: Diagnosis not present

## 2014-01-18 DIAGNOSIS — F17200 Nicotine dependence, unspecified, uncomplicated: Secondary | ICD-10-CM | POA: Insufficient documentation

## 2014-01-18 DIAGNOSIS — E119 Type 2 diabetes mellitus without complications: Secondary | ICD-10-CM | POA: Diagnosis not present

## 2014-01-18 DIAGNOSIS — N898 Other specified noninflammatory disorders of vagina: Secondary | ICD-10-CM | POA: Insufficient documentation

## 2014-01-18 DIAGNOSIS — R443 Hallucinations, unspecified: Secondary | ICD-10-CM | POA: Insufficient documentation

## 2014-01-18 DIAGNOSIS — Z8659 Personal history of other mental and behavioral disorders: Secondary | ICD-10-CM

## 2014-01-18 DIAGNOSIS — R440 Auditory hallucinations: Secondary | ICD-10-CM

## 2014-01-18 LAB — POC URINE PREG, ED: PREG TEST UR: NEGATIVE

## 2014-01-18 LAB — RAPID URINE DRUG SCREEN, HOSP PERFORMED
Amphetamines: NOT DETECTED
BARBITURATES: NOT DETECTED
Benzodiazepines: NOT DETECTED
Cocaine: NOT DETECTED
Opiates: NOT DETECTED
TETRAHYDROCANNABINOL: NOT DETECTED

## 2014-01-18 LAB — COMPREHENSIVE METABOLIC PANEL
ALK PHOS: 104 U/L (ref 39–117)
ALT: 13 U/L (ref 0–35)
AST: 20 U/L (ref 0–37)
Albumin: 3.6 g/dL (ref 3.5–5.2)
Anion gap: 14 (ref 5–15)
BUN: 5 mg/dL — AB (ref 6–23)
CHLORIDE: 100 meq/L (ref 96–112)
CO2: 23 meq/L (ref 19–32)
Calcium: 9.2 mg/dL (ref 8.4–10.5)
Creatinine, Ser: 0.86 mg/dL (ref 0.50–1.10)
GFR calc non Af Amer: >90 mL/min (ref >90)
GLUCOSE: 114 mg/dL — AB (ref 70–99)
POTASSIUM: 3.5 meq/L — AB (ref 3.7–5.3)
Sodium: 137 mEq/L (ref 137–147)
TOTAL PROTEIN: 8 g/dL (ref 6.0–8.3)

## 2014-01-18 LAB — CBC
HEMATOCRIT: 34.7 % — AB (ref 36.0–46.0)
HEMOGLOBIN: 11.5 g/dL — AB (ref 12.0–15.0)
MCH: 29.3 pg (ref 26.0–34.0)
MCHC: 33.1 g/dL (ref 30.0–36.0)
MCV: 88.3 fL (ref 78.0–100.0)
Platelets: 300 10*3/uL (ref 150–400)
RBC: 3.93 MIL/uL (ref 3.87–5.11)
RDW: 15.1 % (ref 11.5–15.5)
WBC: 13.6 10*3/uL — ABNORMAL HIGH (ref 4.0–10.5)

## 2014-01-18 LAB — ACETAMINOPHEN LEVEL: Acetaminophen (Tylenol), Serum: <15 ug/mL (ref 10–30)

## 2014-01-18 LAB — ETHANOL: Alcohol, Ethyl (B): <11 mg/dL (ref 0–11)

## 2014-01-18 LAB — SALICYLATE LEVEL: Salicylate Lvl: <2 mg/dL — ABNORMAL LOW (ref 2.8–20.0)

## 2014-01-18 MED ORDER — IBUPROFEN 200 MG PO TABS: 600.0000 mg | ORAL_TABLET | Freq: Three times a day (TID) | ORAL | Status: DC | PRN

## 2014-01-18 MED ORDER — NICOTINE 21 MG/24HR TD PT24
21.0000 mg | MEDICATED_PATCH | Freq: Every day | TRANSDERMAL | Status: DC
  Administered 2014-01-19 (×2): 21 mg via TRANSDERMAL
  Filled 2014-01-18 (×2): qty 1

## 2014-01-18 MED ORDER — DIVALPROEX SODIUM ER 500 MG PO TB24
500.0000 mg | ORAL_TABLET | Freq: Two times a day (BID) | ORAL | Status: DC
  Administered 2014-01-18 – 2014-01-19 (×2): 500 mg via ORAL
  Filled 2014-01-18 (×4): qty 1

## 2014-01-18 MED ORDER — LORAZEPAM 1 MG PO TABS
1.0000 mg | ORAL_TABLET | Freq: Three times a day (TID) | ORAL | Status: DC | PRN
  Administered 2014-01-18 – 2014-01-19 (×2): 1 mg via ORAL
  Filled 2014-01-18 (×2): qty 1

## 2014-01-18 MED ORDER — ALUM & MAG HYDROXIDE-SIMETH 200-200-20 MG/5ML PO SUSP: 30.0000 mL | ORAL | Status: DC | PRN

## 2014-01-18 MED ORDER — ONDANSETRON HCL 4 MG PO TABS: 4.0000 mg | ORAL_TABLET | Freq: Three times a day (TID) | ORAL | Status: DC | PRN

## 2014-01-18 MED ORDER — ACETAMINOPHEN 325 MG PO TABS: 650.0000 mg | ORAL_TABLET | ORAL | Status: DC | PRN

## 2014-01-18 MED ORDER — ZOLPIDEM TARTRATE 5 MG PO TABS
5.0000 mg | ORAL_TABLET | Freq: Every evening | ORAL | Status: DC | PRN
  Administered 2014-01-18: 5 mg via ORAL
  Filled 2014-01-18: qty 1
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
## 2014-01-18 NOTE — ED Notes (Signed)
.  Report received from [name] RN. Pt. Alert and oriented in no distress denies SI, HI, and pain. Pt. Does c/o AH but unable to describe without VH. Will continue to monitor for safety. Pt. Instructed to come to me with problems or concerns. Q 15 minute checks continue.

## 2014-01-18 NOTE — BH Assessment (Signed)
Assessment completed. Consulted with Alberteen Sam, NP who recommended inpatient treatment. Mercedes Camprubi-Soms, PA-C has been informed of the recommendation.
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
## 2014-01-18 NOTE — ED Provider Notes (Signed)
CSN: [id number]     Arrival date & time 01/18/14  2039 History  This chart was scribed for non-physician practitioner, Allen Derry, PA-C working with Toy Baker, MD by Gwenyth Ober, ED scribe. This patient was seen in room WTR4/WLPT4 and the patient's care was started at 10:05 PM  LEVEL 5 CAVEAT: PT TANGENTIAL AND ACUTELY MANIC, UNABLE/UNWILLING TO PROVIDE MANY HISTORICAL FACTS   Chief Complaint  Patient presents with  . Hallucinations   Patient is a 28 y.o. female presenting with altered mental status. The history is provided by the patient. No language interpreter was used.  Altered Mental Status Presenting symptoms comment:  Auditory hallucinations Severity:  Moderate Most recent episode:  Today Episode history:  Unable to specify Duration:  1 day Timing:  Constant Progression:  Unchanged Context: not taking medications as prescribed   Associated symptoms: hallucinations (Auditory)   Associated symptoms: no abdominal pain, no fever, no headaches, no light-headedness, no nausea, no vomiting and no weakness    HPI Comments: Molly [name] is a 28 y.o. female with a history of schizophrenia who presents to the Emergency Department via [facility], complaining of auditory hallucinations. She says the voices are saying "click clack" and to hurt other people. She has no SI and no plans of hurting anyone. She has not taken her schizophrenia medication tonight.  Pt denies fevers, URI symptoms, cough, SOB, CP, abdominal pain, n/v/d/c, urinary symptoms, rashes, or any other medical concerns aside from stating she has vaginal bleeding and is currently pregnant, 7 months gestation, but does not give any other historical facts regarding this alleged vaginal bleeding and pregnancy. Pt states she drinks 1 beer nightly, but denies any consumption earlier today. She smokes 1/2 ppd. She denies any drug use.   Past Medical History  Diagnosis Date  . Schizophrenia   . CERVICITIS, GONOCOCCAL,  History of 01/09/2007    Qualifier: History of  By: McDiarmid MD, Tawanna Cooler    . ATTENTION DEFICIT, W/O HYPERACTIVITY, History of 06/30/2006    Qualifier: History of  By: McDiarmid MD, Tawanna Cooler    . Depression   . 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 Dennie Bible (Psych) At [facility] in  [place], [place]. Qualifier: Hospitalized for  By: McDiarmid MD, Tawanna Cooler    . 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 TOBACCO USER 02/08/2009    Qualifier: Diagnosis of  By: Knox Royalty    . 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 ASC-cannot exclude HGSIL on Pap 02/15/2012    ASC-US on 02/03/2012 pap (associated Trichomonas infection). No reflex HPV testing performed on specimen.  Patient informed that she will need repeat Pap in one year.      . Diabetes mellitus     diet controlled  . Eczema   . Abnormal Pap smear    Past Surgical History  Procedure Laterality Date  . Incision and drainage      pilanodal cyst   Family History  Problem Relation Age of Onset  . Adopted: Yes  . Bipolar disorder Sister   . Alcohol abuse Brother   . Cancer Father   . Diabetes Mother    History  Substance Use Topics  . Smoking status: Current Every Day Smoker -- 0.50 packs/day for 10 years    Types: Cigarettes  . Smokeless  tobacco: Never Used  . Alcohol Use: Yes     Comment: occ   OB History   Grav Para Term Preterm Abortions TAB SAB Ect Mult Living   Review of Systems  Constitutional: Negative for fever and chills.  HENT: Negative for congestion and rhinorrhea.   Eyes: Negative for visual disturbance.  Respiratory: Negative for cough and shortness of breath.   Cardiovascular: Negative for chest pain.  Gastrointestinal: Negative for nausea, vomiting, abdominal pain, diarrhea, constipation and abdominal distention.  Genitourinary:  Positive for vaginal bleeding. Negative for dysuria, urgency, frequency, vaginal discharge, vaginal pain, menstrual problem and pelvic pain.  Musculoskeletal: Negative for arthralgias and myalgias.  Neurological: Negative for syncope, weakness, light-headedness and headaches.  Psychiatric/Behavioral: Positive for hallucinations (Auditory). Negative for suicidal ideas and self-injury.   10 Systems reviewed and all are negative for acute change except as noted in the HPI.    Allergies  Review of patient's allergies indicates no known allergies.  Home Medications   Prior to Admission medications   Medication Sig Start Date End Date Taking? Authorizing Provider  divalproex (DEPAKOTE ER) 500 MG 24 hr tablet Take 500 mg by mouth 2 (two) times daily.    Yes Historical Provider, MD  PRESCRIPTION MEDICATION Injection given 2 weeks ago as a mood stabilizer    Historical Provider, MD   BP 180/97  Pulse 112  Temp(Src) 98.1 F (36.7 C) (Oral)  Resp 18  SpO2 99% Physical Exam  Nursing note and vitals reviewed. Constitutional: Vital signs are normal. She appears well-developed and well-nourished.  Non-toxic appearance.  VSS during exam although triage VS showing mild tachycardia and HTN, unclear if this is baseline for pt. Afebrile and nontoxic  HENT:  Head: Normocephalic and atraumatic.  Mouth/Throat: Mucous membranes are normal.  Eyes: Conjunctivae and EOM are normal.  Neck: Normal range of motion. Neck supple.  Cardiovascular: Normal rate, regular rhythm, normal heart sounds and intact distal pulses.  Exam reveals no gallop and no friction rub.   No murmur heard. Initially tachycardic which resolved prior to exam  Pulmonary/Chest: Effort normal and breath sounds normal. No respiratory distress. She has no decreased breath sounds. She has no wheezes. She has no rhonchi. She has no rales.  Abdominal: Soft. Normal appearance and bowel sounds are normal. She exhibits no distension. There is no  tenderness. There is no rigidity, no rebound, no guarding, no CVA tenderness, no tenderness at McBurney's point and negative Murphy's sign.  No palpable uterus enlargement, soft NTND no r/g/r, +BS throughout  Musculoskeletal: Normal range of motion.  Lymphadenopathy:    She has no cervical adenopathy.  Neurological: She has normal strength. No sensory deficit.  Skin: Skin is warm, dry and intact. No rash noted.  Psychiatric: Her affect is inappropriate. Her speech is tangential. She is hyperactive and actively hallucinating. She expresses impulsivity and inappropriate judgment. She expresses no homicidal and no suicidal ideation. She expresses no suicidal plans and no homicidal plans.  Hypersexual, removed pants and requesting vaginal exam several times during exam. Tangential and hyperactive, does not answer questions appropriately. Denies SI/HI. Endorses command AH.    ED Course  Procedures (including critical care time) DIAGNOSTIC STUDIES: Oxygen Saturation is 99% on RA, normal by my interpretation.    COORDINATION OF CARE: 10:10 PM Will order UA. Pt agreed to plan.      Labs Review Labs Reviewed  ACETAMINOPHEN LEVEL  CBC  COMPREHENSIVE  METABOLIC PANEL  ETHANOL  SALICYLATE LEVEL  URINE RAPID DRUG SCREEN (HOSP PERFORMED)  VALPROIC ACID LEVEL    Imaging Review No results found.   EKG Interpretation None      MDM   Final diagnoses:  Auditory hallucinations  Hypersexuality state  History of schizophrenia    28y/o female with schizophrenia and appears acutely manic with auditory command hallucinations and erratic behavior, hypersexual towards staff. Tangential and difficult to get clear hx. Labs obtained showing neg Upreg, therefore doubt her complaints of "being pregnant 7mos along, with vaginal bleeding" are truthful. UDS neg, APAP level neg, CBC with diff showing mildly elevated WBC but unconcerning for infection, and chronic anemia noted. CMP showing chronic K 3.5,  no need to treat at this time. EtOH and ASA levels neg. Valproic acid level pending but will transfer to psych area. IVC paperwork filed due to pt wanting to leave to smoke, and belief that she is a threat to others and acutely manic. Pt stable at this time, medically cleared, and ready for TTS consult. TTS calling stating she meets inpatient criteria.   I personally performed the services described in this documentation, which was scribed in my presence. The recorded information has been reviewed and is accurate.  BP 180/97  Pulse 112  Temp(Src) 98.1 F (36.7 C) (Oral)  Resp 18  SpO2 99%  Meds ordered this encounter  Medications  . acetaminophen (TYLENOL) tablet 650 mg    Sig:   . ibuprofen (ADVIL,MOTRIN) tablet 600 mg    Sig:   . nicotine (NICODERM CQ - dosed in mg/24 hours) patch 21 mg    Sig:   . ondansetron (ZOFRAN) tablet 4 mg    Sig:   . zolpidem (AMBIEN) tablet 5 mg    Sig:   . alum & mag hydroxide-simeth (MAALOX/MYLANTA) 200-200-20 MG/5ML suspension 30 mL    Sig:   . LORazepam (ATIVAN) tablet 1 mg    Sig:   . divalproex (DEPAKOTE ER) 24 hr tablet 500 mg    Sig:      Donnita Falls Ione, PA-C 01/19/14 0211

## 2014-01-18 NOTE — ED Provider Notes (Signed)
Medical screening examination/treatment/procedure(s) were conducted as a shared visit with non-physician practitioner(s) and myself.  I personally evaluated the patient during the encounter.   EKG Interpretation None     Patient here with hallucinations telling her that she needs to hurt herself and hurt other people. Patient has been IVC for her safety. Will be evaluated by psychiatry  Toy Baker, MD 01/18/14 2303

## 2014-01-19 DIAGNOSIS — F259 Schizoaffective disorder, unspecified: Secondary | ICD-10-CM | POA: Diagnosis not present

## 2014-01-19 LAB — VALPROIC ACID LEVEL: Valproic Acid Lvl: 89.5 ug/mL (ref 50.0–100.0)

## 2014-01-19 MED ORDER — CLONIDINE HCL 0.1 MG PO TABS
0.1000 mg | ORAL_TABLET | Freq: Once | ORAL | Status: AC
  Administered 2014-01-19: 0.1 mg via ORAL
  Filled 2014-01-19: qty 1

## 2014-01-19 MED ORDER — HALOPERIDOL 5 MG PO TABS
5.0000 mg | ORAL_TABLET | Freq: Two times a day (BID) | ORAL | Status: DC
  Administered 2014-01-19: 5 mg via ORAL
  Filled 2014-01-19: qty 1

## 2014-01-19 NOTE — Consult Note (Signed)
[facility] Face-to-Face Psychiatry Consult   Reason for Consult:  Psychosis Referring Physician:  EDP Molly [name] is an 28 y.o. female. Total Time spent with patient: 20 minutes  Assessment: AXIS I:  Schizoaffective Disorder AXIS II:  Deferred AXIS III:   Past Medical History  Diagnosis Date  . Schizophrenia   . CERVICITIS, GONOCOCCAL, History of 01/09/2007    Qualifier: History of  By: McDiarmid MD, Sherren Mocha    . ATTENTION DEFICIT, W/O HYPERACTIVITY, History of 06/30/2006    Qualifier: History of  By: McDiarmid MD, Sherren Mocha    . Depression   . 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 Henrene Dodge (Psych) At [facility] in  [place], [place]. Qualifier: Hospitalized for  By: McDiarmid MD, Sherren Mocha    . 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 TOBACCO USER 02/08/2009    Qualifier: Diagnosis of  By: Samara Snide    . 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 ASC-cannot exclude HGSIL on Pap 02/15/2012    ASC-US on 02/03/2012 pap (associated Trichomonas infection). No reflex HPV testing performed on specimen.  Patient informed that she will need repeat Pap in one year.      . Diabetes mellitus     diet controlled  . Eczema   . Abnormal Pap smear    AXIS IV:  other psychosocial or environmental problems, problems related to social environment and problems with primary support group AXIS V:  70; mild symptoms  Plan:  Recommend psychiatric Inpatient admission when medically cleared.Dr. Parke Poisson assessed the patient and concurs with the plan.  Subjective:   Molly [name] is a 28 y.o. female patient does not warrant admission. HPI:  The patient slept well last night.  She states she was seeing spots yesterday.  Denies suicidal/homicidal ideations, hallucinations, and alcohol/drug use.  Patient is interacting appropriately after awakening  She would like  to go back to her group home, [facility]. HPI Elements:   Location:  generalized. Quality:  acute. Severity:  mild. Timing:  brief. Duration:  brief. Context:  issues at group home.  Past Psychiatric History: Past Medical History  Diagnosis Date  . Schizophrenia   . CERVICITIS, GONOCOCCAL, History of 01/09/2007    Qualifier: History of  By: McDiarmid MD, Sherren Mocha    . ATTENTION DEFICIT, W/O HYPERACTIVITY, History of 06/30/2006    Qualifier: History of  By: McDiarmid MD, Sherren Mocha    . Depression   . 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 Henrene Dodge (Psych) At [facility] in  [place], [place]. Qualifier: Hospitalized for  By: McDiarmid MD, Sherren Mocha    . 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 TOBACCO USER 02/08/2009    Qualifier: Diagnosis of  By: Samara Snide    . 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 ASC-cannot exclude HGSIL on Pap 02/15/2012    ASC-US on 02/03/2012 pap (associated Trichomonas infection). No reflex HPV testing performed on specimen.  Patient informed that she will need repeat Pap in one year.      . Diabetes mellitus     diet controlled  . Eczema   . Abnormal  Pap smear     reports that she has been smoking Cigarettes.  She has a 5 pack-year smoking history. She has never used smokeless tobacco. She reports that she drinks alcohol. She reports that she does not use illicit drugs. Family History  Problem Relation Age of Onset  . Adopted: Yes  . Bipolar disorder Sister   . Alcohol abuse Brother   . Cancer Father   . Diabetes Mother    Family History Substance Abuse: No Family Supports: No ("Nobody") Living Arrangements: Other (Comment) (Pt reported that she lives at a home ) Can pt return to current living arrangement?: Yes Abuse/Neglect [facility]) Physical Abuse: Denies Verbal Abuse: Denies Sexual  Abuse: Denies Allergies:  No Known Allergies  ACT Assessment Complete:  Yes:    Educational Status    Risk to Self: Risk to self with the past 6 months Suicidal Ideation: No Suicidal Intent: No Is patient at risk for suicide?: No Suicidal Plan?: No Access to Means: No What has been your use of drugs/alcohol within the last 12 months?: No alcohol or drug use reported Previous Attempts/Gestures: Yes How many times?: 1 Other Self Harm Risks: No other self harm risk identified at this time Triggers for Past Attempts: Unpredictable Intentional Self Injurious Behavior: None Family Suicide History: Unable to assess Recent stressful life event(s):  (No stressful events reported. ) Persecutory voices/beliefs?: No Depression: No Depression Symptoms: Despondent Substance abuse history and/or treatment for substance abuse?: No Suicide prevention information given to non-admitted patients: Not applicable  Risk to Others: Risk to Others within the past 6 months Homicidal Ideation: No Thoughts of Harm to Others: No Current Homicidal Intent: No Current Homicidal Plan: No Access to Homicidal Means: No Identified Victim: NA History of harm to others?: No Assessment of Violence: None Noted Violent Behavior Description: No violent behaviors observed. PT is calm and cooperative at this time. Does patient have access to weapons?: No Criminal Charges Pending?: No Does patient have a court date: No  Abuse: Abuse/Neglect Assessment (Assessment to be complete while patient is alone) Physical Abuse: Denies Verbal Abuse: Denies Sexual Abuse: Denies Exploitation of patient/patient's resources: Denies Self-Neglect: Denies  Prior Inpatient Therapy: Prior Inpatient Therapy Prior Inpatient Therapy: Yes Prior Therapy Dates: 2012 Prior Therapy Facilty/Provider(s): [facility] Reason for Treatment: Schizophrenia  Prior Outpatient Therapy: Prior Outpatient Therapy Prior Outpatient Therapy: Yes Prior Therapy  Dates: 2015 Prior Therapy Facilty/Provider(s): [facility]  Reason for Treatment: Schizophrenia  Additional Information: Additional Information 1:1 In Past 12 Months?: No CIRT Risk: No Elopement Risk: No                  Objective: Blood pressure 180/97, pulse 112, temperature 98.1 F (36.7 C), temperature source Oral, resp. rate 18, SpO2 99.00%.There is no weight on file to calculate BMI. Results for orders placed during the hospital encounter of 01/18/14 (from the past 72 hour(s))  ACETAMINOPHEN LEVEL     Status: None   Collection Time    01/18/14  9:53 PM      Result Value Ref Range   Acetaminophen (Tylenol), Serum <15.0  10 - 30 ug/mL   Comment:            THERAPEUTIC CONCENTRATIONS VARY     SIGNIFICANTLY. A RANGE OF 10-30     ug/mL MAY BE AN EFFECTIVE     CONCENTRATION FOR MANY PATIENTS.     HOWEVER, SOME ARE BEST TREATED     AT CONCENTRATIONS OUTSIDE THIS     RANGE.  ACETAMINOPHEN CONCENTRATIONS     >150 ug/mL AT 4 HOURS AFTER     INGESTION AND >50 ug/mL AT 12     HOURS AFTER INGESTION ARE     OFTEN ASSOCIATED WITH TOXIC     REACTIONS.  CBC     Status: Abnormal   Collection Time    01/18/14  9:53 PM      Result Value Ref Range   WBC 13.6 (*) 4.0 - 10.5 K/uL   RBC 3.93  3.87 - 5.11 MIL/uL   Hemoglobin 11.5 (*) 12.0 - 15.0 g/dL   HCT 34.7 (*) 36.0 - 46.0 %   MCV 88.3  78.0 - 100.0 fL   MCH 29.3  26.0 - 34.0 pg   MCHC 33.1  30.0 - 36.0 g/dL   RDW 15.1  11.5 - 15.5 %   Platelets 300  150 - 400 K/uL  COMPREHENSIVE METABOLIC PANEL     Status: Abnormal   Collection Time    01/18/14  9:53 PM      Result Value Ref Range   Sodium 137  137 - 147 mEq/L   Potassium 3.5 (*) 3.7 - 5.3 mEq/L   Chloride 100  96 - 112 mEq/L   CO2 23  19 - 32 mEq/L   Glucose, Bld 114 (*) 70 - 99 mg/dL   BUN 5 (*) 6 - 23 mg/dL   Creatinine, Ser 0.86  0.50 - 1.10 mg/dL   Calcium 9.2  8.4 - 10.5 mg/dL   Total Protein 8.0  6.0 - 8.3 g/dL   Albumin 3.6  3.5 - 5.2 g/dL   AST 20  0  - 37 U/L   ALT 13  0 - 35 U/L   Alkaline Phosphatase 104  39 - 117 U/L   Total Bilirubin <0.2 (*) 0.3 - 1.2 mg/dL   GFR calc non Af Amer >90  >90 mL/min   GFR calc Af Amer >90  >90 mL/min   Comment: (NOTE)     The eGFR has been calculated using the CKD EPI equation.     This calculation has not been validated in all clinical situations.     eGFR's persistently <90 mL/min signify possible Chronic Kidney     Disease.   Anion gap 14  5 - 15  ETHANOL     Status: None   Collection Time    01/18/14  9:53 PM      Result Value Ref Range   Alcohol, Ethyl (B) <11  0 - 11 mg/dL   Comment:            LOWEST DETECTABLE LIMIT FOR     SERUM ALCOHOL IS 11 mg/dL     FOR MEDICAL PURPOSES ONLY  SALICYLATE LEVEL     Status: Abnormal   Collection Time    01/18/14  9:53 PM      Result Value Ref Range   Salicylate Lvl <1.6 (*) 2.8 - 20.0 mg/dL  VALPROIC ACID LEVEL     Status: None   Collection Time    01/18/14  9:53 PM      Result Value Ref Range   Valproic Acid Lvl 89.5  50.0 - 100.0 ug/mL   Comment: Performed at [facility]     Status: None   Collection Time    01/18/14 10:01 PM      Result Value Ref Range   Opiates NONE DETECTED  NONE DETECTED   Cocaine NONE DETECTED  NONE DETECTED   Benzodiazepines NONE DETECTED  NONE DETECTED   Amphetamines NONE DETECTED  NONE DETECTED   Tetrahydrocannabinol NONE DETECTED  NONE DETECTED   Barbiturates NONE DETECTED  NONE DETECTED   Comment:            DRUG SCREEN FOR MEDICAL PURPOSES     ONLY.  IF CONFIRMATION IS NEEDED     FOR ANY PURPOSE, NOTIFY LAB     WITHIN 5 DAYS.                LOWEST DETECTABLE LIMITS     FOR URINE DRUG SCREEN     Drug Class       Cutoff (ng/mL)     Amphetamine      1000     Barbiturate      200     Benzodiazepine   185     Tricyclics       631     Opiates          300     Cocaine          300     THC              50  POC URINE PREG, ED     Status: None   Collection  Time    01/18/14 10:07 PM      Result Value Ref Range   Preg Test, Ur NEGATIVE  NEGATIVE   Comment:            THE SENSITIVITY OF THIS     METHODOLOGY IS >24 mIU/mL   Labs are reviewed and are pertinent for no medical issues noted.  Current Facility-Administered Medications  Medication Dose Route Frequency Provider Last Rate Last Dose  . acetaminophen (TYLENOL) tablet 650 mg  650 mg Oral Q4H PRN Mercedes Strupp Camprubi-Soms, PA-C      . alum & mag hydroxide-simeth (MAALOX/MYLANTA) 200-200-20 MG/5ML suspension 30 mL  30 mL Oral PRN Mercedes Strupp Camprubi-Soms, PA-C      . divalproex (DEPAKOTE ER) 24 hr tablet 500 mg  500 mg Oral BID Mercedes Strupp Camprubi-Soms, PA-C   500 mg at 01/18/14 2336  . ibuprofen (ADVIL,MOTRIN) tablet 600 mg  600 mg Oral Q8H PRN Mercedes Strupp Camprubi-Soms, PA-C      . LORazepam (ATIVAN) tablet 1 mg  1 mg Oral Q8H PRN Mercedes Strupp Camprubi-Soms, PA-C   1 mg at 01/18/14 2336  . nicotine (NICODERM CQ - dosed in mg/24 hours) patch 21 mg  21 mg Transdermal Daily Mercedes Strupp Camprubi-Soms, PA-C      . ondansetron (ZOFRAN) tablet 4 mg  4 mg Oral Q8H PRN Mercedes Strupp Camprubi-Soms, PA-C      . zolpidem (AMBIEN) tablet 5 mg  5 mg Oral QHS PRN Mercedes Strupp Camprubi-Soms, PA-C   5 mg at 01/18/14 2336   Current Outpatient Prescriptions  Medication Sig Dispense Refill  . divalproex (DEPAKOTE ER) 500 MG 24 hr tablet Take 500 mg by mouth 2 (two) times daily.       Marland Kitchen PRESCRIPTION MEDICATION Injection given 2 weeks ago as a mood stabilizer        Psychiatric Specialty Exam:     Blood pressure 180/97, pulse 112, temperature 98.1 F (36.7 C), temperature source Oral, resp. rate 18, SpO2 99.00%.There is no weight on file to calculate BMI.  General Appearance: Casual  Eye Contact::  Good  Speech:  Normal Rate  Volume:  Normal  Mood:  Euthymic  Affect:  Blunt  Thought Process:  Coherent  Orientation:  Full (Time, Place, and Person)  Thought Content:  WDL   Suicidal Thoughts:  No  Homicidal Thoughts:  No  Memory:  Immediate;   Good Recent;   Good Remote;   Good  Judgement:  Fair  Insight:  Fair  Psychomotor Activity:  Normal  Concentration:  Good  Recall:  Good  Fund of Knowledge:Good  Language: Good  Akathisia:  No  Handed:  Right  AIMS (if indicated):     Assets:  Financial Resources/Insurance Housing Physical Health Resilience Social Support Transportation  Sleep:      Musculoskeletal: Strength & Muscle Tone: within normal limits Gait & Station: normal Patient leans: N/A  Treatment Plan Summary: Discharge back to her group home with follow-up with her regular provdier.  Waylan Boga, [place] 01/19/2014 8:40 AM  Patient seen and evaluated with NP as above

## 2014-01-19 NOTE — BH Assessment (Signed)
Tele Assessment Note   Molly [name] is an 28 y.o. female presenting to [facility] ED with the chief complaint of auditory and visual hallucinatios.PT stated "I hear voices telling me to fight, fight, fight and I am seeing spots, dots, things and faces".  Pt denies SI and HI at this time. Pt reported that she is experiencing AVH at this time. Pt is also delusional and believes that she is pregnant; however her pregnancy test is negative. Pt did not report any illicit substance use. Pt denied having access to weapons and did not report any pending criminal charges.  Pt is alert and oriented x3. Pt is calm and cooperative at this time. Pt maintained good eye contact throughout this assessment. Pt mood is euthymic and her affect is blunted.  Inpatient treatment has been recommended.   Axis I: Schizophrenia Axis II: Deferred Axis III:  Past Medical History  Diagnosis Date  . Schizophrenia   . CERVICITIS, GONOCOCCAL, History of 01/09/2007    Qualifier: History of  By: McDiarmid MD, Tawanna Cooler    . ATTENTION DEFICIT, W/O HYPERACTIVITY, History of 06/30/2006    Qualifier: History of  By: McDiarmid MD, Tawanna Cooler    . Depression   . 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 Dennie Bible (Psych) At [facility] in  [place], [place]. Qualifier: Hospitalized for  By: McDiarmid MD, Tawanna Cooler    . 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 TOBACCO USER 02/08/2009    Qualifier: Diagnosis of  By: Knox Royalty    . 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 ASC-cannot exclude HGSIL on Pap 02/15/2012    ASC-US on 02/03/2012 pap (associated Trichomonas infection). No reflex HPV testing performed on specimen.  Patient informed that she will need repeat Pap in one year.      . Diabetes mellitus     diet controlled  . Eczema   . Abnormal Pap smear    Axis IV: other psychosocial or  environmental problems Axis V: 21-30 behavior considerably influenced by delusions or hallucinations OR serious impairment in judgment, communication OR inability to function in almost all areas  Past Medical History:  Past Medical History  Diagnosis Date  . Schizophrenia   . CERVICITIS, GONOCOCCAL, History of 01/09/2007    Qualifier: History of  By: McDiarmid MD, Tawanna Cooler    . ATTENTION DEFICIT, W/O HYPERACTIVITY, History of 06/30/2006    Qualifier: History of  By: McDiarmid MD, Tawanna Cooler    . Depression   . 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 Dennie Bible (Psych) At [facility] in  [name], [place]. Qualifier: Hospitalized for  By: McDiarmid MD, Tawanna Cooler    . 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 TOBACCO USER 02/08/2009    Qualifier: Diagnosis of  By: Knox Royalty    . 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 ASC-cannot exclude HGSIL on Pap 02/15/2012    ASC-US on 02/03/2012 pap (associated Trichomonas infection). No reflex HPV testing performed on specimen.  Patient informed that she will need repeat Pap in one year.      . Diabetes mellitus     diet controlled  .  Eczema   . Abnormal Pap smear     Past Surgical History  Procedure Laterality Date  . Incision and drainage      pilanodal cyst    Family History:  Family History  Problem Relation Age of Onset  . Adopted: Yes  . Bipolar disorder Sister   . Alcohol abuse Brother   . Cancer Father   . Diabetes Mother     Social History:  reports that she has been smoking Cigarettes.  She has a 5 pack-year smoking history. She has never used smokeless tobacco. She reports that she drinks alcohol. She reports that she does not use illicit drugs.  Additional Social History:  Alcohol / Drug Use History of alcohol / drug use?: No history of alcohol / drug abuse  CIWA: CIWA-Ar BP:  180/97 mmHg Pulse Rate: 112 COWS:    PATIENT STRENGTHS: (choose at least two) Special hobby/interest Supportive family/friends  Allergies: No Known Allergies  Home Medications:  (Not in a hospital admission)  OB/GYN Status:  No LMP recorded.  General Assessment Data Location of Assessment: [facility] ED Is this a Tele or Face-to-Face Assessment?: Face-to-Face Is this an Initial Assessment or a Re-assessment for this encounter?: Initial Assessment Living Arrangements: Other (Comment) (Pt reported that she lives at a home ) Can pt return to current living arrangement?: Yes Admission Status: Involuntary Is patient capable of signing voluntary admission?: No Transfer from: Home Referral Source: Self/Family/Friend     [facility] Crisis Care Plan Living Arrangements: Other (Comment) (Pt reported that she lives at a home ) Name of Psychiatrist: Vesta Mixer  Name of Therapist: [facility]   Education Status Is patient currently in school?: No Current Grade: NA Highest grade of school patient has completed: NA Name of school: NA Contact person: NA  Risk to self with the past 6 months Suicidal Ideation: No Suicidal Intent: No Is patient at risk for suicide?: No Suicidal Plan?: No Access to Means: No What has been your use of drugs/alcohol within the last 12 months?: No alcohol or drug use reported Previous Attempts/Gestures: Yes How many times?: 1 Other Self Harm Risks: No other self harm risk identified at this time Triggers for Past Attempts: Unpredictable Intentional Self Injurious Behavior: None Family Suicide History: Unable to assess Recent stressful life event(s):  (No stressful events reported. ) Persecutory voices/beliefs?: No Depression: No Depression Symptoms: Despondent Substance abuse history and/or treatment for substance abuse?: No Suicide prevention information given to non-admitted patients: Not applicable  Risk to Others within the past 6 months Homicidal Ideation:  No Thoughts of Harm to Others: No Current Homicidal Intent: No Current Homicidal Plan: No Access to Homicidal Means: No Identified Victim: NA History of harm to others?: No Assessment of Violence: None Noted Violent Behavior Description: No violent behaviors observed. PT is calm and cooperative at this time. Does patient have access to weapons?: No Criminal Charges Pending?: No Does patient have a court date: No  Psychosis Hallucinations: Auditory;Visual;With command Delusions: Unspecified  Mental Status Report Appear/Hygiene: Bizarre Eye Contact: Good Motor Activity: Freedom of movement Speech: Logical/coherent;Tangential Level of Consciousness: Alert Mood: Pleasant Affect: Blunted Anxiety Level: None Thought Processes: Tangential Judgement: Impaired Orientation: Time;Place;Person Obsessive Compulsive Thoughts/Behaviors: Minimal  Cognitive Functioning Concentration: Fair Memory: Unable to Assess IQ: Average Insight: Poor Impulse Control: Unable to Assess Appetite: Good Weight Loss: 0 Weight Gain: 0 Sleep: No Change Total Hours of Sleep: 8 Vegetative Symptoms: None  ADLScreening [facility] Assessment Services) Patient's cognitive ability adequate to safely complete daily activities?: Yes  Patient able to express need for assistance with ADLs?: Yes Independently performs ADLs?: Yes (appropriate for developmental age)  Prior Inpatient Therapy Prior Inpatient Therapy: Yes Prior Therapy Dates: 2012 Prior Therapy Facilty/Provider(s): [facility] Reason for Treatment: Schizophrenia  Prior Outpatient Therapy Prior Outpatient Therapy: Yes Prior Therapy Dates: 2015 Prior Therapy Facilty/Provider(s): [facility]  Reason for Treatment: Schizophrenia  ADL Screening (condition at time of admission) Patient's cognitive ability adequate to safely complete daily activities?: Yes Is the patient deaf or have difficulty hearing?: No Does the patient have difficulty seeing, even when wearing  glasses/contacts?: No Does the patient have difficulty concentrating, remembering, or making decisions?: No Patient able to express need for assistance with ADLs?: Yes Does the patient have difficulty dressing or bathing?: No Independently performs ADLs?: Yes (appropriate for developmental age)       Abuse/Neglect Assessment (Assessment to be complete while patient is alone) Physical Abuse: Denies Verbal Abuse: Denies Sexual Abuse: Denies Exploitation of patient/patient's resources: Denies Self-Neglect: Denies Values / Beliefs Cultural Requests During Hospitalization: None Spiritual Requests During Hospitalization: None   Advance Directives (For Healthcare) Does patient have an advance directive?: No Would patient like information on creating an advanced directive?: No - patient declined information    Additional Information 1:1 In Past 12 Months?: No CIRT Risk: No Elopement Risk: No     Disposition:  Disposition Initial Assessment Completed for this Encounter: Yes Disposition of Patient: Inpatient treatment program Type of inpatient treatment program: Adult  Avalyn Molino S 01/19/2014 12:01 AM

## 2014-01-19 NOTE — [facility] Suicide Risk Assessment (Signed)
Suicide Risk Assessment  Discharge Assessment     Demographic Factors:  Adolescent or young adult  Total Time spent with patient: 20 minutes  Psychiatric Specialty Exam:     Blood pressure 180/97, pulse 112, temperature 98.1 F (36.7 C), temperature source Oral, resp. rate 18, SpO2 99.00%.There is no weight on file to calculate BMI.  General Appearance: Casual  Eye Contact::  Good  Speech:  Normal Rate  Volume:  Normal  Mood:  Euthymic  Affect:  Blunt  Thought Process:  Coherent  Orientation:  Full (Time, Place, and Person)  Thought Content:  WDL  Suicidal Thoughts:  No  Homicidal Thoughts:  No  Memory:  Immediate;   Good Recent;   Good Remote;   Good  Judgement:  Fair  Insight:  Fair  Psychomotor Activity:  Normal  Concentration:  Good  Recall:  Good  Fund of Knowledge:Good  Language: Good  Akathisia:  No  Handed:  Right  AIMS (if indicated):     Assets:  Health and safety inspector Housing Physical Health Resilience Social Support Transportation  Sleep:      Musculoskeletal: Strength & Muscle Tone: within normal limits Gait & Station: normal Patient leans: N/A  Mental Status Per Nursing Assessment::   On Admission:   hallucinations  Current Mental Status by Physician: NA  Loss Factors: NA  Historical Factors: NA  Risk Reduction Factors:   Living with another person, especially a relative, Positive social support and Positive therapeutic relationship  Continued Clinical Symptoms:  None  Cognitive Features That Contribute To Risk:  None   Suicide Risk:  Minimal: No identifiable suicidal ideation.  Patients presenting with no risk factors but with morbid ruminations; may be classified as minimal risk based on the severity of the depressive symptoms  Discharge Diagnoses:   AXIS I:  Schizoaffective Disorder AXIS II:  Deferred AXIS III:   Past Medical History  Diagnosis Date  . Schizophrenia   . CERVICITIS, GONOCOCCAL, History of  01/09/2007    Qualifier: History of  By: McDiarmid MD, Tawanna Cooler    . ATTENTION DEFICIT, W/O HYPERACTIVITY, History of 06/30/2006    Qualifier: History of  By: McDiarmid MD, Tawanna Cooler    . Depression   . 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 Dennie Bible (Psych) At [facility] in  [place], [place]. Qualifier: Hospitalized for  By: McDiarmid MD, Tawanna Cooler    . 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 TOBACCO USER 02/08/2009    Qualifier: Diagnosis of  By: Knox Royalty    . 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 ASC-cannot exclude HGSIL on Pap 02/15/2012    ASC-US on 02/03/2012 pap (associated Trichomonas infection). No reflex HPV testing performed on specimen.  Patient informed that she will need repeat Pap in one year.      . Diabetes mellitus     diet controlled  . Eczema   . Abnormal Pap smear    AXIS IV:  other psychosocial or environmental problems, problems related to social environment and problems with primary support group AXIS V:  61-70 mild symptoms  Plan Of Care/Follow-up recommendations:  Activity:  as tolerated Diet:  low-sodium heart healthy diet  Is patient on multiple antipsychotic therapies at discharge:  No   Has Patient had three or more failed trials of antipsychotic monotherapy by  history:  No  Recommended Plan for Multiple Antipsychotic Therapies: NA    Molly [name], PMH-NP 01/19/2014, 11:42 AM

## 2014-01-19 NOTE — ED Notes (Signed)
Pt on the telephone. 
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
## 2014-01-19 NOTE — ED Notes (Signed)
Meal given

## 2014-01-19 NOTE — ED Notes (Signed)
Pt at nursing desk on the telephone

## 2014-01-19 NOTE — BH Assessment (Addendum)
Pt meets inpt criteria and there are no [facility] beds available. Faxed referrals to:  [facility] as [facility] reports they have adult beds.  [facility] per [facility] they have beds.  Declined: [facility] due to chronicity of illness  Clista Bernhardt, [facility] Triage Specialist 01/19/2014 12:16 AM
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
## 2014-01-19 NOTE — ED Notes (Signed)
Meal provided 

## 2014-01-19 NOTE — ED Provider Notes (Signed)
Medical screening examination/treatment/procedure(s) were conducted as a shared visit with non-physician practitioner(s) and myself.  I personally evaluated the patient during the encounter.   EKG Interpretation None       Toy Baker, MD 01/19/14 1640

## 2014-01-27 ENCOUNTER — Emergency Department (HOSPITAL_COMMUNITY)
Admission: EM | Admit: 2014-01-27 | Discharge: 2014-01-31 | Disposition: A | Discharge status: Other Acute Inpatient Hospital | Payer: Medicare Other | Attending: Emergency Medicine | Admitting: Emergency Medicine

## 2014-01-27 ENCOUNTER — Emergency Department (HOSPITAL_COMMUNITY): Payer: Medicare Other

## 2014-01-27 ENCOUNTER — Encounter (HOSPITAL_COMMUNITY): Payer: Self-pay | Admitting: Emergency Medicine

## 2014-01-27 DIAGNOSIS — E119 Type 2 diabetes mellitus without complications: Secondary | ICD-10-CM | POA: Diagnosis not present

## 2014-01-27 DIAGNOSIS — Z8659 Personal history of other mental and behavioral disorders: Secondary | ICD-10-CM

## 2014-01-27 DIAGNOSIS — Z9119 Patient's noncompliance with other medical treatment and regimen: Secondary | ICD-10-CM | POA: Diagnosis not present

## 2014-01-27 DIAGNOSIS — R4689 Other symptoms and signs involving appearance and behavior: Secondary | ICD-10-CM

## 2014-01-27 DIAGNOSIS — A630 Anogenital (venereal) warts: Secondary | ICD-10-CM

## 2014-01-27 DIAGNOSIS — F259 Schizoaffective disorder, unspecified: Secondary | ICD-10-CM | POA: Diagnosis not present

## 2014-01-27 DIAGNOSIS — R45851 Suicidal ideations: Secondary | ICD-10-CM | POA: Diagnosis not present

## 2014-01-27 DIAGNOSIS — F209 Schizophrenia, unspecified: Secondary | ICD-10-CM | POA: Diagnosis not present

## 2014-01-27 DIAGNOSIS — F3289 Other specified depressive episodes: Secondary | ICD-10-CM | POA: Diagnosis not present

## 2014-01-27 DIAGNOSIS — R21 Rash and other nonspecific skin eruption: Secondary | ICD-10-CM

## 2014-01-27 DIAGNOSIS — F329 Major depressive disorder, single episode, unspecified: Secondary | ICD-10-CM | POA: Diagnosis not present

## 2014-01-27 DIAGNOSIS — F29 Unspecified psychosis not due to a substance or known physiological condition: Secondary | ICD-10-CM | POA: Diagnosis not present

## 2014-01-27 DIAGNOSIS — F17200 Nicotine dependence, unspecified, uncomplicated: Secondary | ICD-10-CM

## 2014-01-27 DIAGNOSIS — N912 Amenorrhea, unspecified: Secondary | ICD-10-CM

## 2014-01-27 DIAGNOSIS — F200 Paranoid schizophrenia: Secondary | ICD-10-CM

## 2014-01-27 DIAGNOSIS — A5901 Trichomonal vulvovaginitis: Secondary | ICD-10-CM

## 2014-01-27 DIAGNOSIS — M7989 Other specified soft tissue disorders: Secondary | ICD-10-CM | POA: Diagnosis not present

## 2014-01-27 DIAGNOSIS — B3731 Acute candidiasis of vulva and vagina: Secondary | ICD-10-CM

## 2014-01-27 DIAGNOSIS — Z91199 Patient's noncompliance with other medical treatment and regimen due to unspecified reason: Secondary | ICD-10-CM

## 2014-01-27 DIAGNOSIS — Z209 Contact with and (suspected) exposure to unspecified communicable disease: Secondary | ICD-10-CM

## 2014-01-27 DIAGNOSIS — F250 Schizoaffective disorder, bipolar type: Secondary | ICD-10-CM

## 2014-01-27 DIAGNOSIS — B373 Candidiasis of vulva and vagina: Secondary | ICD-10-CM

## 2014-01-27 LAB — COMPREHENSIVE METABOLIC PANEL
ALT: 27 U/L (ref 0–35)
ANION GAP: 12 (ref 5–15)
AST: 19 U/L (ref 0–37)
Albumin: 3.9 g/dL (ref 3.5–5.2)
Alkaline Phosphatase: 124 U/L — ABNORMAL HIGH (ref 39–117)
BUN: 9 mg/dL (ref 6–23)
CO2: 28 meq/L (ref 19–32)
Calcium: 9.3 mg/dL (ref 8.4–10.5)
Chloride: 99 mEq/L (ref 96–112)
Creatinine, Ser: 0.77 mg/dL (ref 0.50–1.10)
GFR calc non Af Amer: >90 mL/min (ref >90)
GLUCOSE: 86 mg/dL (ref 70–99)
POTASSIUM: 4 meq/L (ref 3.7–5.3)
Sodium: 139 mEq/L (ref 137–147)
TOTAL PROTEIN: 8 g/dL (ref 6.0–8.3)
Total Bilirubin: <0.2 mg/dL — ABNORMAL LOW (ref 0.3–1.2)

## 2014-01-27 LAB — CBC
HEMATOCRIT: 37.8 % (ref 36.0–46.0)
HEMOGLOBIN: 12.9 g/dL (ref 12.0–15.0)
MCH: 29.4 pg (ref 26.0–34.0)
MCHC: 34.1 g/dL (ref 30.0–36.0)
MCV: 86.1 fL (ref 78.0–100.0)
Platelets: 306 10*3/uL (ref 150–400)
RBC: 4.39 MIL/uL (ref 3.87–5.11)
RDW: 15.3 % (ref 11.5–15.5)
WBC: 10.3 10*3/uL (ref 4.0–10.5)

## 2014-01-27 LAB — RAPID URINE DRUG SCREEN, HOSP PERFORMED
Amphetamines: NOT DETECTED
BARBITURATES: NOT DETECTED
Benzodiazepines: NOT DETECTED
Cocaine: NOT DETECTED
Opiates: NOT DETECTED
Tetrahydrocannabinol: NOT DETECTED

## 2014-01-27 LAB — ACETAMINOPHEN LEVEL: Acetaminophen (Tylenol), Serum: <15 ug/mL (ref 10–30)

## 2014-01-27 LAB — ETHANOL: Alcohol, Ethyl (B): <11 mg/dL (ref 0–11)

## 2014-01-27 LAB — POC URINE PREG, ED: PREG TEST UR: NEGATIVE

## 2014-01-27 LAB — SALICYLATE LEVEL

## 2014-01-27 MED ORDER — LORAZEPAM 2 MG/ML IJ SOLN
2.0000 mg | Freq: Once | INTRAMUSCULAR | Status: AC
  Administered 2014-01-27: 2 mg via INTRAMUSCULAR
  Filled 2014-01-27: qty 1

## 2014-01-27 MED ORDER — LIDOCAINE-EPINEPHRINE-TETRACAINE (LET) SOLUTION
3.0000 mL | Freq: Once | NASAL | Status: AC
  Administered 2014-01-27: 3 mL via TOPICAL
  Filled 2014-01-27: qty 3

## 2014-01-27 MED ORDER — ZOLPIDEM TARTRATE 5 MG PO TABS
5.0000 mg | ORAL_TABLET | Freq: Every evening | ORAL | Status: DC | PRN
  Administered 2014-01-27 – 2014-01-30 (×2): 5 mg via ORAL
  Filled 2014-01-27 (×2): qty 1

## 2014-01-27 MED ORDER — DIPHENHYDRAMINE HCL 50 MG/ML IJ SOLN
25.0000 mg | Freq: Once | INTRAMUSCULAR | Status: AC
  Administered 2014-01-27: 25 mg via INTRAMUSCULAR
  Filled 2014-01-27: qty 1

## 2014-01-27 MED ORDER — STERILE WATER FOR INJECTION IJ SOLN
2.0000 mL | Freq: Once | INTRAMUSCULAR | Status: AC
  Administered 2014-01-27: 2 mL via INTRAMUSCULAR

## 2014-01-27 MED ORDER — IBUPROFEN 200 MG PO TABS
600.0000 mg | ORAL_TABLET | Freq: Three times a day (TID) | ORAL | Status: DC | PRN
  Administered 2014-01-29: 600 mg via ORAL
  Filled 2014-01-27: qty 1
  Filled 2014-01-27: qty 3
  Filled 2014-01-27: qty 1

## 2014-01-27 MED ORDER — ZIPRASIDONE MESYLATE 20 MG IM SOLR
20.0000 mg | Freq: Once | INTRAMUSCULAR | Status: AC
  Administered 2014-01-27: 20 mg via INTRAMUSCULAR
  Filled 2014-01-27: qty 20

## 2014-01-27 MED ORDER — ALUM & MAG HYDROXIDE-SIMETH 200-200-20 MG/5ML PO SUSP: 30.0000 mL | ORAL | Status: DC | PRN

## 2014-01-27 MED ORDER — LORAZEPAM 1 MG PO TABS
1.0000 mg | ORAL_TABLET | Freq: Three times a day (TID) | ORAL | Status: DC | PRN
  Administered 2014-01-27 – 2014-01-31 (×3): 1 mg via ORAL
  Filled 2014-01-27 (×3): qty 1

## 2014-01-27 MED ORDER — ONDANSETRON HCL 4 MG PO TABS: 4.0000 mg | ORAL_TABLET | Freq: Three times a day (TID) | ORAL | Status: DC | PRN

## 2014-01-27 MED ORDER — NICOTINE 21 MG/24HR TD PT24
21.0000 mg | MEDICATED_PATCH | Freq: Every day | TRANSDERMAL | Status: DC
  Administered 2014-01-28 – 2014-01-31 (×6): 21 mg via TRANSDERMAL
  Filled 2014-01-27 (×5): qty 1

## 2014-01-27 NOTE — ED Notes (Signed)
Attempted again to speak with pt. Concerning removing the ring.  Pt. Will not let us touch her finger.  She become very agitated

## 2014-01-27 NOTE — ED Notes (Signed)
X-rays completed of her finger

## 2014-01-27 NOTE — ED Notes (Signed)
TTS completed. 

## 2014-01-27 NOTE — BH Assessment (Signed)
[facility] Assessment Progress Note  CSW spoke with Jacki Cones, RN, pt's RN at this time.  TTS consult was called in at 1053 today but CSW was told then that pt was not ready to be seen yet due to needing labs.  CSW called back at this time and was informed by Jacki Cones, RN that pt is still not ready to be assessed as they are continuing to try to get a ring off of her swollen finger and still need to draw blood.  Jacki Cones states that she will call TTS when pt is ready to be assessed.   Reyes Ivan, [name] 01/27/2014  11:36 AM
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
## 2014-01-27 NOTE — ED Notes (Signed)
Pt. Was transferred from [facility] with IVC paper.  She was IVC yestersday due to bizarre behavior. Delusional. Depression, anxiety.  Pt. Was transferred to [place] for [facility] consult, to remove a ring that is stuck on her lt. Ring finger.  The finger is swollen.  Pt. Is very delusional, drinking from a baby bottle Pt. Was brought by [facility] and was informed by [facility] that they are unable to take pt. Back.

## 2014-01-27 NOTE — ED Notes (Signed)
IVC papers faxed to [facility].  
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
## 2014-01-27 NOTE — ED Notes (Signed)
Lat place on lt. 4th finger.  Area numb for 15 minutes and ring removed by ring cutter with assistance of the ED department.  Emotional support given to pt. While removing ring.  Pt. Tolerated procedure.

## 2014-01-27 NOTE — ED Notes (Addendum)
Personal items inventoried and stored in soiled utility room.

## 2014-01-27 NOTE — ED Notes (Signed)
Pt making second phone call for the day. Informed that she cannot make any more phone calls for the night.

## 2014-01-28 MED ORDER — DIVALPROEX SODIUM ER 500 MG PO TB24
500.0000 mg | ORAL_TABLET | Freq: Three times a day (TID) | ORAL | Status: DC
  Administered 2014-01-28 – 2014-01-31 (×9): 500 mg via ORAL
  Filled 2014-01-28 (×12): qty 1

## 2014-01-28 NOTE — ED Notes (Addendum)
Pt refused to take her depakote because she "knows we are giving her crack and she doesn't want it"  RN explained that it was her scheduled med and she started screaming at nurse and refused to take her meds.  Pt quickly changed her mind and asked for her night medication and RN gave it to her.

## 2014-01-28 NOTE — ED Notes (Signed)
Pt got up out of bed, walked out in the hall and peed on the hallway floor, then returned to her room and crawled back into bed with pee soaked scrubs on .

## 2014-01-28 NOTE — ED Notes (Signed)
Pt dinner tray ordered.

## 2014-01-28 NOTE — ED Notes (Signed)
Pt sat in her bed and urinated all over herself and bed.  When asked why pt stated she didn't know.  Pt was given new scrubs, bed linens and urine cleaned up.  Pt took off her soiled scrubs and before putting on new scrubs she took peanut butter and rubbed it in her anus.

## 2014-01-28 NOTE — ED Notes (Signed)
Pt took off her scrubs and then sat and peed in her bed.  Client forced herself to throw up because she couldn't have chocolate cake.

## 2014-01-28 NOTE — ED Notes (Signed)
Pt in bathroom sitting on commode wants door open got mad and threw oatmeal on wall. Up walking in halls limits set , pt made phone call

## 2014-01-28 NOTE — ED Notes (Signed)
Pt ran from room in search of her "doctor" and needed encouragement to go back to her room.  Pt pointed finger at nurse talking about "yall have not won and God [id number] revenge me".

## 2014-01-28 NOTE — ED Notes (Signed)
Pt had pulled apart nurse call bell. Facilities here to replace pt understands that she will not get another if she  Breaks this one

## 2014-01-28 NOTE — ED Notes (Signed)
Security at bedside limit set so pt would not walk hall

## 2014-01-28 NOTE — ED Notes (Signed)
Pt refused vitals; would not give explanation why when asked.

## 2014-01-28 NOTE — ED Provider Notes (Signed)
Patient alert, content, nad. No new c/o or issues overnight.   Filed Vitals:   01/28/14 0550  BP: 114/79  Pulse: 103  Temp: 98.9 F (37.2 C)  Resp: 18   Discussed with psych team - psychiatrist to evaluate in ED this morning.  Psych team continues to work on placement.    Suzi Roots, MD 01/28/14 1047

## 2014-01-29 DIAGNOSIS — R45851 Suicidal ideations: Secondary | ICD-10-CM | POA: Diagnosis not present

## 2014-01-29 DIAGNOSIS — Z91199 Patient's noncompliance with other medical treatment and regimen due to unspecified reason: Secondary | ICD-10-CM | POA: Diagnosis not present

## 2014-01-29 DIAGNOSIS — F259 Schizoaffective disorder, unspecified: Secondary | ICD-10-CM | POA: Diagnosis not present

## 2014-01-29 DIAGNOSIS — Z9119 Patient's noncompliance with other medical treatment and regimen: Secondary | ICD-10-CM | POA: Diagnosis not present

## 2014-01-29 MED ORDER — ADULT MULTIVITAMIN W/MINERALS CH
1.0000 | ORAL_TABLET | Freq: Every day | ORAL | Status: DC
  Administered 2014-01-29 – 2014-01-31 (×4): 1 via ORAL
  Filled 2014-01-29 (×4): qty 1

## 2014-01-29 MED ORDER — HYDROXYZINE HCL 25 MG PO TABS
50.0000 mg | ORAL_TABLET | Freq: Every day | ORAL | Status: DC
  Administered 2014-01-29 – 2014-01-30 (×2): 50 mg via ORAL
  Filled 2014-01-29 (×2): qty 2

## 2014-01-29 MED ORDER — STERILE WATER FOR INJECTION IJ SOLN: 1.2000 mL | Freq: Once | INTRAMUSCULAR | Status: DC

## 2014-01-29 MED ORDER — CHLORPROMAZINE HCL 25 MG PO TABS
25.0000 mg | ORAL_TABLET | Freq: Two times a day (BID) | ORAL | Status: DC
  Administered 2014-01-29 – 2014-01-31 (×5): 25 mg via ORAL
  Filled 2014-01-29 (×5): qty 1

## 2014-01-29 MED ORDER — ZIPRASIDONE MESYLATE 20 MG IM SOLR: 10.0000 mg | Freq: Once | INTRAMUSCULAR | Status: DC

## 2014-01-29 NOTE — ED Notes (Signed)
DR JONNALAGADA IN TO SEE PT 

## 2014-01-29 NOTE — ED Provider Notes (Signed)
Medical screening examination/treatment/procedure(s) were conducted as a shared visit with non-physician practitioner(s) and myself.  I personally evaluated the patient during the encounter.   EKG Interpretation None      Patient with extensive history of schizophrenia and paranoid behavior [facility]. Patient is delusional and paranoid on exam.  She is difficult to redirect. She is drinking juice from a baby's bottle.  She is otherwise nontoxic. Physical exam is otherwise only notable for a ringing on the left fourth digit with significant swelling. Ring was removed at the bedside; patient required sedation and restraint for removal but it was removed without incident. She's been evaluated by TTS and will be placed.  Shon Batonourtney F Zayvion Stailey, MD 01/29/14 [phone]
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
## 2014-01-29 NOTE — ED Notes (Signed)
SNACK TAKEN TO  PATIENT

## 2014-01-29 NOTE — ED Notes (Signed)
Pt hanging at nurses station. She is asking for new glasses because hers are broken, she is asking for her retainer, for vasaline etc. emt has looked at pt glasses and has offered to fix the cracked rim and pt agreed. We have looked in pt belongings for her described retainer but it is not listed on her belongings inventory. Pt has become tearful and made a phone call stating "i am at [facility] and they are stealing my stuff" then returned to her room.

## 2014-01-29 NOTE — ED Notes (Signed)
[facility] HAS ARRIVED TO TRANSPORT PATIENT TO [facility] WITH HER BELONGINGS. PT IS COOPERATIVE WITH [employer]
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
## 2014-01-29 NOTE — ED Notes (Signed)
[employer] CALLED TO TRANSPORT 

## 2014-01-29 NOTE — ED Notes (Signed)
[facility]. States she does not want her identity stolen.

## 2014-01-30 DIAGNOSIS — F259 Schizoaffective disorder, unspecified: Secondary | ICD-10-CM

## 2014-01-30 MED ORDER — DIPHENHYDRAMINE HCL 50 MG/ML IJ SOLN
50.0000 mg | Freq: Once | INTRAMUSCULAR | Status: AC
  Administered 2014-01-30: 50 mg via INTRAMUSCULAR
  Filled 2014-01-30: qty 1

## 2014-01-30 MED ORDER — HYDROCERIN EX CREA
TOPICAL_CREAM | Freq: Two times a day (BID) | CUTANEOUS | Status: DC
  Administered 2014-01-30: 1 via TOPICAL
  Administered 2014-01-31 (×2): via TOPICAL
  Filled 2014-01-30: qty 113

## 2014-01-30 MED ORDER — LORAZEPAM 2 MG/ML IJ SOLN
2.0000 mg | Freq: Once | INTRAMUSCULAR | Status: AC
  Administered 2014-01-30: 2 mg via INTRAMUSCULAR
  Filled 2014-01-30: qty 1

## 2014-01-30 MED ORDER — ZIPRASIDONE MESYLATE 20 MG IM SOLR
20.0000 mg | Freq: Once | INTRAMUSCULAR | Status: AC
  Administered 2014-01-30: 20 mg via INTRAMUSCULAR
  Filled 2014-01-30: qty 20

## 2014-01-30 MED ORDER — WHITE PETROLATUM GEL
Status: DC | PRN
  Administered 2014-01-30: 20:00:00 via TOPICAL
  Filled 2014-01-30: qty 5

## 2014-01-30 NOTE — ED Notes (Signed)
Patient appears anxious, agitated. Paced unit. Redirected to stay on her side of the unit by MHTs. Continues to pace unit. Rolls eyes at [employer] Electric[employer] and gives middle finger.

## 2014-01-30 NOTE — ED Notes (Signed)
Patient remains paranoid. Will not speak with Clinical research associatewriter. Requests ointment for her dry skin and lips from charge nurse.

## 2014-01-30 NOTE — ED Notes (Signed)
Patient approached charge nurse asked to use the phone. Appears agitated. Became loud and requested her medications.
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
## 2014-01-30 NOTE — ED Notes (Signed)
Patient continues to be disruptive, disturbing other patients. Patient has been redirected several times to stay in her room and not to disturb others but is not complying.
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
## 2014-01-30 NOTE — ED Notes (Signed)
Patient threw bedside table across the floor, threw juice in the floor, slammed her door closed. Stated "nobody offered me anything to drink". When writer tried to inquire what the problem is, patient stated "Didn't I say I was ignoring you".  Bedside table and chair removed from patient's room. Patient lying in bed at present.   Q 15 safety checks in place.

## 2014-01-31 DIAGNOSIS — F259 Schizoaffective disorder, unspecified: Secondary | ICD-10-CM | POA: Diagnosis not present

## 2014-01-31 DIAGNOSIS — E119 Type 2 diabetes mellitus without complications: Secondary | ICD-10-CM | POA: Diagnosis not present

## 2014-01-31 DIAGNOSIS — R443 Hallucinations, unspecified: Secondary | ICD-10-CM | POA: Diagnosis present

## 2014-01-31 MED ORDER — CHLORPROMAZINE HCL 25 MG PO TABS: 50.0000 mg | ORAL_TABLET | Freq: Two times a day (BID) | ORAL | Status: DC

## 2014-01-31 MED ORDER — CHLORPROMAZINE HCL 50 MG PO TABS: 50.0000 mg | ORAL_TABLET | Freq: Two times a day (BID) | ORAL | Status: DC

## 2014-01-31 MED ORDER — DIVALPROEX SODIUM ER 500 MG PO TB24: 1000.0000 mg | ORAL_TABLET | Freq: Two times a day (BID) | ORAL | Status: DC

## 2014-01-31 MED ORDER — HYDROXYZINE HCL 50 MG PO TABS: 50.0000 mg | ORAL_TABLET | Freq: Every day | ORAL | Status: DC

## 2014-01-31 MED ORDER — DIVALPROEX SODIUM ER 500 MG PO TB24
1000.0000 mg | ORAL_TABLET | Freq: Two times a day (BID) | ORAL | Status: DC
  Filled 2014-01-31 (×2): qty 2

## 2014-01-31 NOTE — ED Notes (Signed)
Patient alert. Lying in her bed

## 2014-01-31 NOTE — [facility] Suicide Risk Assessment (Signed)
Demographic Factors:  Low socioeconomic status, Unemployed and female  Total Time spent with patient: 20 minutes  Psychiatric Specialty Exam: Physical Exam  Review of Systems  Constitutional: Negative.   HENT: Negative.   Eyes: Negative.   Respiratory: Negative.   Cardiovascular: Negative.   Gastrointestinal: Negative.   Genitourinary: Negative.   Musculoskeletal: Negative.   Skin: Negative.   Neurological: Negative.   Endo/Heme/Allergies: Negative.   Psychiatric/Behavioral: Negative.     Blood pressure 127/85, pulse 114, temperature 97.4 F (36.3 C), temperature source Oral, resp. rate 20, SpO2 100.00%.There is no weight on file to calculate BMI.  General Appearance: Casual  Eye Contact::  Fair  Speech:  Clear and Coherent  Volume:  Normal  Mood:  Euthymic  Affect:  Appropriate  Thought Process:  Circumstantial  Orientation:  Full (Time, Place, and Person)  Thought Content:  Negative  Suicidal Thoughts:  No  Homicidal Thoughts:  No  Memory:  Immediate;   Fair Recent;   Fair Remote;   Fair  Judgement:  Other:  marginal  Insight:  Shallow  Psychomotor Activity:  Normal  Concentration:  Fair  Recall:  FiservFair  Fund of Knowledge:Fair  Language: Fair  Akathisia:  No  Handed:  Right  AIMS (if indicated):     Assets:  Communication Skills Desire for Improvement  Sleep:   poor    Musculoskeletal: Strength & Muscle Tone: within normal limits Gait & Station: normal Patient leans: N/A   Mental Status Per Nursing Assessment::   On Admission:     Current Mental Status by Physician: patient denies suicidal ideation, intent or plan  Loss Factors: Financial problems/change in socioeconomic status  Historical Factors: NA  Risk Reduction Factors:   [place]  Continued Clinical Symptoms:  Resolving psychosis  Cognitive Features That Contribute To Risk:  Closed-mindedness Polarized thinking    Suicide Risk:  Minimal: No identifiable  suicidal ideation.  Patients presenting with no risk factors but with morbid ruminations; may be classified as minimal risk based on the severity of the depressive symptoms  Discharge Diagnoses:   AXIS I:  Schizoaffective disorder  AXIS II:  Deferred AXIS III:   Past Medical History  Diagnosis Date  . Schizophrenia   . CERVICITIS, GONOCOCCAL, History of 01/09/2007    Qualifier: History of  By: McDiarmid MD, Tawanna Coolerodd    . ATTENTION DEFICIT, W/O HYPERACTIVITY, History of 06/30/2006    Qualifier: History of  By: McDiarmid MD, Tawanna Coolerodd    . Depression   . 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 Dennie Bibleichard Larsen (Psych[facility]  [place], [place]. Qualifier: Hospitalized for  By: McDiarmid MD, Tawanna Coolerodd    . 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 TOBACCO USER 02/08/2009    Qualifier: Diagnosis of  By: Knox Royaltydell, Erin    . 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 ASC-cannot exclude HGSIL on Pap 02/15/2012    ASC-US on 02/03/2012 pap (associated Trichomonas infection). No reflex HPV testing performed on specimen.  Patient informed that she will need repeat Pap in one year.      . Diabetes mellitus     diet controlled  . Eczema   . Abnormal Pap smear    AXIS IV:  housing problems, other psychosocial or environmental problems and problems related to  social environment AXIS V:  61-70 mild symptoms  Plan Of Care/Follow-up recommendations:  Activity:  as toletrated Diet:  healthy  Is patient on multiple antipsychotic therapies at discharge:  No   Has Patient had three or more failed trials of antipsychotic monotherapy by history:  No  Recommended Plan for Multiple Antipsychotic Therapies: NA    Thedore Mins, MD 01/31/2014, 9:43 AM

## 2014-01-31 NOTE — ED Notes (Signed)
MD in to see patient 

## 2014-01-31 NOTE — ED Notes (Signed)
Patient again requesting to use the phone. Asking to speak with her doctor. States she did not get to see the doctor 9/29. Requesting charge nurse to call doctor in and to assess her.

## 2014-01-31 NOTE — BH Assessment (Signed)
[facility] inpt [facility]  Clista BernhardtNancy Yahya Boldman,  [facility] T[facility] Specialist 01/31/2014 1:55 AM

## 2014-01-31 NOTE — ED Notes (Signed)
Patient sitting by restrooms after receiving juice and having tv adjusted.

## 2014-01-31 NOTE — ED Notes (Signed)
Patient requesting to use phone again. Reminded of phone hours.

## 2014-01-31 NOTE — Consult Note (Addendum)
[facility] Face-to-Face Psychiatry Consult   Reason for Consult:  Disorganized behavior and paranoia. Referring Physician:  EDP  Molly [name] is an 28 y.o. female. Total Time spent with patient: 20 minutes  Assessment: AXIS I:  Schizoaffective Disorder AXIS II:  Deferred AXIS III:   Past Medical History  Diagnosis Date  . Schizophrenia   . CERVICITIS, GONOCOCCAL, History of 01/09/2007    Qualifier: History of  By: McDiarmid MD, Tawanna Cooler    . ATTENTION DEFICIT, W/O HYPERACTIVITY, History of 06/30/2006    Qualifier: History of  By: McDiarmid MD, Tawanna Cooler    . Depression   . 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 Dennie Bible (Psych) At [facility] in  [place], [place]. Qualifier: Hospitalized for  By: McDiarmid MD, Tawanna Cooler    . 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 TOBACCO USER 02/08/2009    Qualifier: Diagnosis of  By: Knox Royalty    . 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 ASC-cannot exclude HGSIL on Pap 02/15/2012    ASC-US on 02/03/2012 pap (associated Trichomonas infection). No reflex HPV testing performed on specimen.  Patient informed that she will need repeat Pap in one year.      . Diabetes mellitus     diet controlled  . Eczema   . Abnormal Pap smear    AXIS IV:  housing problems, other psychosocial or environmental problems, problems related to social environment and problems with primary support group AXIS V:  61-70 mild symptoms  Plan:  Continue Depakote ER 500 mg BID for mood swings Start Thorazine 25 mg PO BID for psychosis Vistaril 50 mg PO Qhs for anxiety and insomnia No evidence of imminent risk to self or others at present.   Patient does not meet criteria for psychiatric inpatient admission. Awaiting placement on [address] hall at [facility] Subjective:   Molly [name] is a 28 y.o. female patient admitted with  delusional thinking, bizarre and disorganized behavior.  HPI:  Molly [name] is an 28 y.o. Female, with history of schizoaffective disorder, currently homeless and unemployed but states that she has a bed at the shelter [facility]. Patient is less bizarre or disorganized today. She denies suicidal/homicidal thoughts, delusional thinking or psychosis. Patient reports that her medication has started to kick in. She reports that she can return to [facility] and will follow up at [facility] for medication management.  HPI Elements:   Location:  psychosis and paranoid. Quality: mild Severity:  Improving since patient started taking medications.  Past Psychiatric History: Past Medical History  Diagnosis Date  . Schizophrenia   . CERVICITIS, GONOCOCCAL, History of 01/09/2007    Qualifier: History of  By: McDiarmid MD, Tawanna Cooler    . ATTENTION DEFICIT, W/O HYPERACTIVITY, History of 06/30/2006    Qualifier: History of  By: McDiarmid MD, Tawanna Cooler    . Depression   . 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 Dennie Bible (Psych) At [facility] in  [place], [place]. Qualifier: Hospitalized for  By: McDiarmid MD, Tawanna Cooler    . 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 TOBACCO USER 02/08/2009    Qualifier: Diagnosis of  By: Knox Royalty    . CONDYLOMA  ACUMINATA, HISTORY OF 05/12/2009    Qualifier: History of  By: McDiarmid MD, Tawanna Coolerodd    . ASC-cannot exclude HGSIL on Pap 02/15/2012    ASC-US on 02/03/2012 pap (associated Trichomonas infection). No reflex HPV testing performed on specimen.  Patient informed that she will need repeat Pap in one year.      . Diabetes mellitus     diet controlled  . Eczema   . Abnormal Pap smear     reports that she has been smoking Cigarettes.  She has a 5 pack-year smoking history. She has never used smokeless tobacco. She reports that she drinks alcohol. She reports that she does  not use illicit drugs. Family History  Problem Relation Age of Onset  . Adopted: Yes  . Bipolar disorder Sister   . Alcohol abuse Brother   . Cancer Father   . Diabetes Mother    Family History Substance Abuse: No Family Supports: No Living Arrangements: Other (Comment) Can pt return to current living arrangement?: Yes Abuse/Neglect [facility] Physical Abuse:  (Unable to determine) Verbal Abuse:  (Unable to determine) Sexual Abuse:  (Unable to determine) Allergies:   Allergies  Allergen Reactions  . Abilify [Aripiprazole] Other (See Comments)    Thinks its nasty- does not want it    ACT Assessment Complete:  Yes:    Educational Status    Risk to Self: Risk to self with the past 6 months Suicidal Ideation: No Suicidal Intent: No Is patient at risk for suicide?: No Suicidal Plan?: No Access to Means: No What has been your use of drugs/alcohol within the last 12 months?:  (none reported) Previous Attempts/Gestures: No How many times?:  (unknown) Other Self Harm Risks:  (unknown) Triggers for Past Attempts: Unpredictable Intentional Self Injurious Behavior: None Family Suicide History: Unknown;Unable to assess Recent stressful life event(s): Other (Comment);Turmoil (Comment) Persecutory voices/beliefs?: No Depression: No Substance abuse history and/or treatment for substance abuse?: No Suicide prevention information given to non-admitted patients: Not applicable  Risk to Others: Risk to Others within the past 6 months Homicidal Ideation: No Thoughts of Harm to Others: No Current Homicidal Intent: No Current Homicidal Plan: No Access to Homicidal Means: No Identified Victim:  (N/A) History of harm to others?: No Assessment of Violence: None Noted Violent Behavior Description:  (none noted) Does patient have access to weapons?: No Criminal Charges Pending?: No Does patient have a court date:  (unknown - pt reports tresspassing charge pending)  Abuse: Abuse/Neglect  Assessment (Assessment to be complete while patient is alone) Physical Abuse:  (Unable to determine) Verbal Abuse:  (Unable to determine) Sexual Abuse:  (Unable to determine) Exploitation of patient/patient's resources:  (Unable to determine) Self-Neglect:  (Unable to determine)  Prior Inpatient Therapy: Prior Inpatient Therapy Prior Inpatient Therapy: Yes Prior Therapy Dates:  (reports inpatient in [place], unknown dates) Prior Therapy Facilty/Provider(s):  ([place]) Reason for Treatment:  (unknown to pt)  Prior Outpatient Therapy: Prior Outpatient Therapy Prior Outpatient Therapy: Yes Prior Therapy Dates:  (unknown to pt) Prior Therapy Facilty/Provider(s):  (unknown to pt) Reason for Treatment: Schizophrenia  Additional Information: Additional Information 1:1 In Past 12 Months?: No CIRT Risk: No Elopement Risk: No Does patient have medical clearance?: Yes   Objective: Blood pressure 127/85, pulse 114, temperature 97.4 F (36.3 C), temperature source Oral, resp. rate 20, SpO2 100.00%.There is no weight on file to calculate BMI. No results found for this or any previous visit (from the past 72 hour(s)). Labs are reviewed.  Current Facility-Administered Medications  Medication Dose Route Frequency Provider Last Rate Last Dose  . alum & mag hydroxide-simeth (MAALOX/MYLANTA) 200-200-20 MG/5ML suspension 30 mL  30 mL Oral PRN Trevor Mace, PA-C      . chlorproMAZINE (THORAZINE) tablet 25 mg  25 mg Oral BID Nehemiah Settle, MD   25 mg at 01/31/14 0921  . divalproex (DEPAKOTE ER) 24 hr tablet 500 mg  500 mg Oral TID Audree Camel, MD   500 mg at 01/31/14 9147  . hydrocerin (EUCERIN) cream   Topical BID Linwood Dibbles, MD      . hydrOXYzine (ATARAX/VISTARIL) tablet 50 mg  50 mg Oral QHS Nehemiah Settle, MD   50 mg at 01/30/14 2114  . ibuprofen (ADVIL,MOTRIN) tablet 600 mg  600 mg Oral Q8H PRN Trevor Mace, PA-C   600 mg at 01/29/14 1633  . LORazepam (ATIVAN) tablet 1 mg   1 mg Oral Q8H PRN Trevor Mace, PA-C   1 mg at 01/31/14 8295  . multivitamin with minerals tablet 1 tablet  1 tablet Oral Daily Nathan R. Pickering, MD   1 tablet at 01/31/14 0921  . nicotine (NICODERM CQ - dosed in mg/24 hours) patch 21 mg  21 mg Transdermal Daily Trevor Mace, PA-C   21 mg at 01/31/14 0357  . ondansetron (ZOFRAN) tablet 4 mg  4 mg Oral Q8H PRN Trevor Mace, PA-C      . sterile water (preservative free) injection 1.2 mL  1.2 mL Injection Once Harrold Donath R. Pickering, MD      . white petrolatum (VASELINE) gel   Topical PRN Linwood Dibbles, MD      . zolpidem [facility]) tablet 5 mg  5 mg Oral QHS PRN Trevor Mace, PA-C   5 mg at 01/30/14 2114   Current Outpatient Prescriptions  Medication Sig Dispense Refill  . divalproex (DEPAKOTE ER) 500 MG 24 hr tablet Take 500 mg by mouth 3 (three) times daily.       . Paliperidone Palmitate (INVEGA SUSTENNA) 234 MG/1.5ML SUSP Inject 234 mg into the muscle every 30 (thirty) days.      . temazepam (RESTORIL) 30 MG capsule Take 30 mg by mouth at bedtime as needed for sleep.        Psychiatric Specialty Exam: Physical Exam Full physical performed in Emergency Department. I have reviewed this assessment and concur with its findings.   Review of Systems  Psychiatric/Behavioral: The patient is nervous/anxious.     Blood pressure 127/85, pulse 114, temperature 97.4 F (36.3 C), temperature source Oral, resp. rate 20, SpO2 100.00%.There is no weight on file to calculate BMI.  General Appearance: casual  Eye Contact::  Fair  Speech:  Normal Rate and Slow  Volume:  Normal  Mood:  Euthymic  Affect:  Constricted  Thought Process:  Circumstantial  Orientation:  Full (Time, Place, and Person)  Thought Content:  Negative and Rumination  Suicidal Thoughts:  No  Homicidal Thoughts:  No  Memory:  Immediate;   Fair Recent;   Fair  Judgement:  Other:  marginal  Insight:  Shallow  Psychomotor Activity:  Normal and Restlessness  Concentration:  Fair   Recall:  [employer] of Knowledge:Fair  Language: Fair  Akathisia:  NA  Handed:  Right  AIMS (if indicated):     Assets:  Communication Skills Desire for Improvement Leisure Time Resilience  Sleep:   fair   Musculoskeletal: Strength & Muscle Tone: within normal limits Gait & Station: normal Patient  leans: N/A  Treatment Plan Summary: Daily contact with patient to assess and evaluate symptoms and progress in treatment Medication management Patient will be discharged back to [facility] with outpatient resources Increase Depakote ER to 100 mg BID for mood swings Increase Thorazine to 50 mg PO BID for psychosis Continue Vistaril 50 mg PO Qhs for anxiety and insomnia  Thedore Mins, MD 01/31/2014 9:27 AM

## 2014-01-31 NOTE — ED Notes (Signed)
Patient remains seated by restrooms.   Q 15 safety checks continue.

## 2014-01-31 NOTE — ED Notes (Signed)
Patient remains quiet. Sitting by restrooms approximately ten minutes.

## 2014-01-31 NOTE — [facility] Counselor (Signed)
Dr. Ladona Ridgelaylor has rescinded pt's IVC. Paperwork placed in [facility] IVC log and copy placed in pt's chart.  Molly [name], [place] Assessment Counselor

## 2014-01-31 NOTE — ED Notes (Signed)
Patient refused vitals.

## 2014-01-31 NOTE — ED Notes (Signed)
Meal given

## 2014-01-31 NOTE — ED Notes (Signed)
Patient tossed her cup at nurses' station then got up and went to her room.

## 2014-01-31 NOTE — ED Notes (Signed)
Up to restroom.

## 2014-01-31 NOTE — Discharge Instructions (Signed)
Schizoaffective Disorder Schizoaffective disorder (ScAD) is a mental illness. It causes symptoms that are a mixture of schizophrenia (a psychotic disorder) and an affective (mood) disorder. The schizophrenic symptoms may include delusions, hallucinations, or odd behavior. The mood symptoms may be similar to major depression or bipolar disorder. ScAD may interfere with personal relationships or normal daily activities. People with ScAD are at increased risk for job loss, social isolation,physical health problems, anxiety and substance use disorders, and suicide. ScAD usually occurs in cycles. Periods of severe symptoms are followed by periods of less severe symptoms or improvement. The illness affects men and women equally but usually appears at an earlier age (teenage or early adult years) in men. People who have family members with schizophrenia, bipolar disorder, or ScAD are at higher risk of developing ScAD. SYMPTOMS  At any one time, people with ScAD may have psychotic symptoms only or both psychotic and mood symptoms. The psychotic symptoms include one or more of the following:  Hearing, seeing, or feeling things that are not there (hallucinations).   Having fixed, false beliefs (delusions). The delusions usually are of being attacked, harassed, cheated, persecuted, or conspired against (paranoid delusions).  Speaking in a way that makes no sense to others (disorganized speech). The psychotic symptoms of ScAD may also include confusing or odd behavior or any of the negative symptoms of schizophrenia. These include loss of motivation for normal daily activities, such as bathing or grooming, withdrawal from other people, and lack of emotions.  The mood symptoms of ScAD occur more often than not. They resemble major depressive disorder or bipolar mania. Symptoms of major depression include depressed mood and four or more of the following:  Loss of interest in usually pleasurable activities  (anhedonia).  Sleeping more or less than normal.  Feeling worthless or excessively guilty.  Lack of energy or motivation.  Trouble concentrating.  Eating more or less than usual.  Thinking a lot about death or suicide. Symptoms of bipolar mania include abnormally elevated or irritable mood and increased energy or activity, plus three or more of the following:   More confidence than normal or feeling that you are able to do anything (grandiosity).  Feeling rested with less sleep than normal.   Being easily distracted.   Talking more than usual or feeling pressured to keep talking.   Feeling that your thoughts are racing.  Engaging in high-risk activities such as buying sprees or foolish business decisions. DIAGNOSIS  ScAD is diagnosed through an assessment by your health care provider. Your health care provider will observe and ask questions about your thoughts, behavior, mood, and ability to function in daily life. Your health care provider may also ask questions about your medical history and use of drugs, including prescription medicines. Your health care provider may also order blood tests and imaging exams. Certain medical conditions and substances can cause symptoms that resemble ScAD. Your health care provider may refer you to a mental health specialist for evaluation.  ScAD is divided into two types. The depressive type is diagnosed if your mood symptoms are limited to major depression. The bipolar type is diagnosed if your mood symptoms are manic or a mixture of manic and depressive symptoms TREATMENT  ScAD is usually a lifelong illness. Long-term treatment is necessary. The following treatments are available:  Medicine. Different types of medicine are used to treat ScAD. The exact combination depends on the type and severity of your symptoms. Antipsychotic medicine is used to control psychotic symptomssuch as delusions, paranoia,   and hallucinations. Mood stabilizers can  even the highs and lows of bipolar manic mood swings. Antidepressant medicines are used to treat major depressive symptoms.  Counseling or talk therapy. Individual, group, or family counseling may be helpful in providing education, support, and guidance. Many people with ScAD also benefit from social skills and job skills (vocational) training. A combination of medicine and counseling is usually best for managing the disorder over time. A procedure in which electricity is applied to the brain through the scalp (electroconvulsive therapy) may be used to treat people with severe manic symptoms that do not respond to medicine and counseling. HOME CARE INSTRUCTIONS   Take all your medicine as prescribed.  Check with your health care provider before starting new prescription or over-the-counter medicines.  Keep all follow up appointments with your health care provider. SEEK MEDICAL CARE IF:   If you are not able to take your medicines as prescribed.  If your symptoms get worse. SEEK IMMEDIATE MEDICAL CARE IF:   You have serious thoughts about hurting yourself or others. Document Released: 08/30/2006 Document Revised: 09/03/2013 Document Reviewed: 12/01/2012 ExitCare Patient Information 2015 [employer]. This information is not intended to replace advice given to you by your health care provider. Make sure you discuss any questions you have with your health care provider.  

## 2014-02-04 ENCOUNTER — Inpatient Hospital Stay (HOSPITAL_COMMUNITY)
Admission: AD | Admit: 2014-02-04 | Discharge: 2014-02-04 | Discharge status: Against Medical Advice | Payer: Medicare Other | Source: Ambulatory Visit | Attending: Obstetrics & Gynecology | Admitting: Obstetrics & Gynecology

## 2014-02-04 DIAGNOSIS — E119 Type 2 diabetes mellitus without complications: Secondary | ICD-10-CM | POA: Insufficient documentation

## 2014-02-04 DIAGNOSIS — F17210 Nicotine dependence, cigarettes, uncomplicated: Secondary | ICD-10-CM | POA: Insufficient documentation

## 2014-02-04 DIAGNOSIS — R109 Unspecified abdominal pain: Secondary | ICD-10-CM | POA: Diagnosis not present

## 2014-02-04 DIAGNOSIS — Z3202 Encounter for pregnancy test, result negative: Secondary | ICD-10-CM | POA: Diagnosis not present

## 2014-02-04 DIAGNOSIS — F259 Schizoaffective disorder, unspecified: Secondary | ICD-10-CM | POA: Diagnosis not present

## 2014-02-04 DIAGNOSIS — Z590 Homelessness: Secondary | ICD-10-CM | POA: Diagnosis not present

## 2014-02-04 DIAGNOSIS — N3000 Acute cystitis without hematuria: Secondary | ICD-10-CM

## 2014-02-04 LAB — URINALYSIS, ROUTINE W REFLEX MICROSCOPIC
Bilirubin Urine: NEGATIVE
Glucose, UA: NEGATIVE mg/dL
Hgb urine dipstick: NEGATIVE
KETONES UR: NEGATIVE mg/dL
NITRITE: NEGATIVE
PROTEIN: NEGATIVE mg/dL
Specific Gravity, Urine: <1.005 — ABNORMAL LOW (ref 1.005–1.030)
Urobilinogen, UA: 0.2 mg/dL (ref 0.0–1.0)
pH: 6.5 (ref 5.0–8.0)

## 2014-02-04 LAB — URINE MICROSCOPIC-ADD ON

## 2014-02-04 LAB — POCT PREGNANCY, URINE: Preg Test, Ur: NEGATIVE

## 2014-02-04 MED ORDER — CEFTRIAXONE SODIUM 1 G IJ SOLR
1.0000 g | Freq: Once | INTRAMUSCULAR | Status: AC
  Administered 2014-02-04: 1 g via INTRAMUSCULAR
  Filled 2014-02-04: qty 10

## 2014-02-04 MED ORDER — NITROFURANTOIN MONOHYD MACRO 100 MG PO CAPS: 100.0000 mg | ORAL_CAPSULE | Freq: Two times a day (BID) | ORAL | Status: DC

## 2014-02-04 NOTE — Progress Notes (Addendum)
CSW attempted to meet with the patient to complete needs assessment.  Patient originally was receptive to visit from CSW and was agreeable to meeting with CSW; however, she quickly became disengaged and fell asleep.  CSW attempted on numerous occassions to re-engage her, but she continued to fall asleep.  When CSW was able to maintain eye contact with the patient, patient would present as thought blocking as she would stare at the CSW and not responded to questions that were being posed.  Patient presented as less receptive to discussing housing and medications in comparison to her physical health needs and became defensive and agitated when CSW attempted to clarify housing and medications concerns.   Patient provided mixed reports on her living situation.  She stated that she had been staying with her friend "TW" last night, but then later stated that she was cold since she had not had anywhere to sleep the night before. [facility], but then later stated that she was unable to do so because she had been "kicked out".   [facility] to re-start her medications, but then later stated that she had not been on Depakote or Haldol for more than 3 weeks.  When CSW attempted to clarify these statements, patient became verbally agitated, jumped out of bed, and threatened to leave.  Patient's RN assisted to de-escalate patient and patient was agreeable to stay.  Due to limited willingess to engage in the conversation, it was difficult to assess patient's thought process. Patient did not present as paranoid; however, conversation with patient was short and brief.   CSW noted in patient's chart that she has previously had an intake appointment scheduled with [facility] to begin ACTT services.  Per [facility], patient has missed two intake appointments.  She stated that they are likely able to schedule one  more intake appointment, but the intake coordinator is not in the office until 10/6.  CSW attempted to make contact with her previous care provider, Ned [facility]ica Stinson at [facility].  Per office staff, Maxine GlennMonica would not be in the office until 10/6.   CSW recommended telepsych consult due to limited ability to engage with patient.   5:15pm: CSW consulted with Wandra MannanZack Brooks, ChiropodistAssistant Director of the Hydrographic surveyorocial Worker Department, who confirmed recommendation for telepsych consult.

## 2014-02-04 NOTE — MAU Provider Note (Signed)
History     CSN: [id number]  Arrival date and time: 02/04/14 1411   None     No chief complaint on file.  HPI 28 yo G1P1, history DM and schizoaffective disorder, homeless, with recent psychiatric ED visit discharged 10/2 for outpatient psychiatric f/u, presents complaining of sensation of "something slipping out of me like I'm having a baby."  Patient somnolent during history taking and unable to fully participate, requiring frequent redirection. Says earlier this morning felt sensation like giving birth. Does not think she is pregnant. Doesn't think anything actually came out of her vagina. Denies discharge. Denies abdominal pain. Denies fevers or chills. Denies constipation or diarrhea. Denies nausea or vomiting. Current not in pain, currently denies vaginal symptoms.  No current auditory or visual hallucinations, says last had an auditory hallucination 2 weeks ago. No SI/HI  OB History   Grav Para Term Preterm Abortions TAB SAB Ect Mult Living   1 1 1       1       Past Medical History  Diagnosis Date  . Schizophrenia   . CERVICITIS, GONOCOCCAL, History of 01/09/2007    Qualifier: History of  By: McDiarmid MD, Tawanna Coolerodd    . ATTENTION DEFICIT, W/O HYPERACTIVITY, History of 06/30/2006    Qualifier: History of  By: McDiarmid MD, Tawanna Coolerodd    . Depression   . 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 Dennie Bibleichard Larsen (Psych) At [facility] in  [place], [place]. Qualifier: Hospitalized for  By: McDiarmid MD, Tawanna Coolerodd    . 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 TOBACCO USER 02/08/2009    Qualifier: Diagnosis of  By: Knox Royaltydell, Erin    . 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 ASC-cannot exclude HGSIL on Pap 02/15/2012    ASC-US on 02/03/2012 pap (associated Trichomonas infection). No reflex HPV testing performed on specimen.  Patient informed that  she will need repeat Pap in one year.      . Diabetes mellitus     diet controlled  . Eczema   . Abnormal Pap smear     Past Surgical History  Procedure Laterality Date  . Incision and drainage      pilanodal cyst    Family History  Problem Relation Age of Onset  . Adopted: Yes  . Bipolar disorder Sister   . Alcohol abuse Brother   . Cancer Father   . Diabetes Mother     History  Substance Use Topics  . Smoking status: Current Every Day Smoker -- 0.50 packs/day for 10 years    Types: Cigarettes  . Smokeless tobacco: Never Used  . Alcohol Use: Yes     Comment: occ    Allergies:  Allergies  Allergen Reactions  . Abilify [Aripiprazole] Other (See Comments)    Thinks its nasty- does not want it    Prescriptions prior to admission  Medication Sig Dispense Refill  . chlorproMAZINE (THORAZINE) 50 MG tablet Take 1 tablet (50 mg total) by mouth 2 (two) times daily.  60 tablet  0  . divalproex (DEPAKOTE ER) 500 MG 24 hr tablet Take 2 tablets (1,000 mg total) by mouth 2 (two) times daily after a meal.  60 tablet  0  . hydrOXYzine (ATARAX/VISTARIL) 50 MG tablet Take 1 tablet (50 mg total) by mouth at bedtime.  30 tablet  0  . Prenatal Vit-Fe Fumarate-FA (PRENATAL MULTIVITAMIN) TABS tablet Take 1 tablet by mouth daily at 12 noon.      . Paliperidone Palmitate (INVEGA SUSTENNA) 234 MG/1.5ML SUSP Inject 234 mg into the muscle every 30 (thirty) days.        Review of Systems  Constitutional: Negative for fever and chills.  Respiratory: Negative for cough.   Cardiovascular: Negative for chest pain.  Gastrointestinal: Negative for nausea, vomiting, abdominal pain, diarrhea and constipation.  Genitourinary: Negative for dysuria and frequency.  Musculoskeletal: Negative for back pain.  Skin: Negative for rash.  Psychiatric/Behavioral:       Last auditory hallucination 2 weeks ago   Physical Exam   Blood pressure 135/85, pulse 96, temperature 99 F (37.2 C), resp. rate  19.  Physical Exam  Constitutional: She appears well-nourished. No distress.  Somnolent, obese  HENT:  Head: Normocephalic and atraumatic.  Cardiovascular: Normal rate, regular rhythm and normal heart sounds.   Respiratory: Effort normal and breath sounds normal.  GI: Soft. She exhibits no distension and no mass. There is no tenderness. There is no rebound and no guarding.  obese  Neurological: She is alert.  Skin: Skin is warm.  Psychiatric:  Difficulty concentrating. Tangential.    MAU Course  Procedures  Results for orders placed during the hospital encounter of 02/04/14 (from the past 24 hour(s))  URINALYSIS, ROUTINE W REFLEX MICROSCOPIC     Status: Abnormal   Collection Time    02/04/14  2:58 PM      Result Value Ref Range   Color, Urine YELLOW  YELLOW   APPearance CLOUDY (*) CLEAR   Specific Gravity, Urine <1.005 (*) 1.005 - 1.030   pH 6.5  5.0 - 8.0   Glucose, UA NEGATIVE  NEGATIVE mg/dL   Hgb urine dipstick NEGATIVE  NEGATIVE   Bilirubin Urine NEGATIVE  NEGATIVE   Ketones, ur NEGATIVE  NEGATIVE mg/dL   Protein, ur NEGATIVE  NEGATIVE mg/dL   Urobilinogen, UA 0.2  0.0 - 1.0 mg/dL   Nitrite NEGATIVE  NEGATIVE   Leukocytes, UA LARGE (*) NEGATIVE  URINE MICROSCOPIC-ADD ON     Status: Abnormal   Collection Time    02/04/14  2:58 PM      Result Value Ref Range   Squamous Epithelial / LPF MANY (*) RARE   WBC, UA 3-6  <3 WBC/hpf   RBC / HPF 0-2  <3 RBC/hpf   Bacteria, UA RARE  RARE  POCT PREGNANCY, URINE     Status: None   Collection Time    02/04/14  3:09 PM      Result Value Ref Range   Preg Test, Ur NEGATIVE  NEGATIVE     Assessment and Plan  28 yo G1P1, history DM and schizoaffective disorder, homeless, with recent psychiatric ED visit discharged 10/2 for outpatient psychiatric f/u, presents with unclear vaginal complaint. Not complaining of pain or bleeding or discharge, and not endorsing active hallucinations. Social work consulted, who also experienced  difficulty obtaining a clear understanding for why patient in the ED and so recommended telehealth psychiatric consult. Urinalysis negative for pregnancy; leuk esterase positive but also many squams. We discussed treating the patient for possible UTI with a course of nitrofurantoin, and we gave a one-time dose of ceftriaxone 1 g IM here with the understanding that outpatient f/u with treatment may not occur. While awaiting telehealth psych consult patient left against medical advice. Psych hold not placed as patient not actively psychotic  or expressing SI/HI.   WOHLERT, NOAH 02/04/2014, 2:59 PM   Seen by [name] and call made to TelePsych.  Did not receive a call back from them before patient left AMA.  Call was made about an hour before patient left.  She asked me earlier to look at her breasts which were both "itching at the areola".  Skin appeared dry but no erethema or other abnormalities noted.  Requested a mammogram, discussed [facility] via the primary care doctor or GYN. Treated with Rocephin for UTI. Conversations with me were all appropriate.  No obvious SI or HI.  Was not observed hearing voices or doing inappropriate things other than using an emesis bag for urination.  See note by [name].  Aviva Signs, CNM

## 2014-02-05 ENCOUNTER — Emergency Department (EMERGENCY_DEPARTMENT_HOSPITAL)
Admission: EM | Admit: 2014-02-05 | Discharge: 2014-02-06 | Disposition: A | Discharge status: Other Acute Inpatient Hospital | Payer: Medicare Other | Source: Home / Self Care | Attending: Emergency Medicine | Admitting: Emergency Medicine

## 2014-02-05 ENCOUNTER — Encounter (HOSPITAL_COMMUNITY): Payer: Self-pay | Admitting: Emergency Medicine

## 2014-02-05 DIAGNOSIS — F200 Paranoid schizophrenia: Secondary | ICD-10-CM | POA: Diagnosis not present

## 2014-02-05 DIAGNOSIS — Z8659 Personal history of other mental and behavioral disorders: Secondary | ICD-10-CM

## 2014-02-05 DIAGNOSIS — R4689 Other symptoms and signs involving appearance and behavior: Secondary | ICD-10-CM

## 2014-02-05 DIAGNOSIS — Z9114 Patient's other noncompliance with medication regimen: Secondary | ICD-10-CM | POA: Diagnosis present

## 2014-02-05 DIAGNOSIS — F259 Schizoaffective disorder, unspecified: Secondary | ICD-10-CM

## 2014-02-05 DIAGNOSIS — Z590 Homelessness: Secondary | ICD-10-CM | POA: Diagnosis not present

## 2014-02-05 DIAGNOSIS — F258 Other schizoaffective disorders: Secondary | ICD-10-CM | POA: Diagnosis not present

## 2014-02-05 DIAGNOSIS — N390 Urinary tract infection, site not specified: Secondary | ICD-10-CM | POA: Diagnosis present

## 2014-02-05 DIAGNOSIS — X58XXXA Exposure to other specified factors, initial encounter: Secondary | ICD-10-CM | POA: Diagnosis present

## 2014-02-05 DIAGNOSIS — F209 Schizophrenia, unspecified: Secondary | ICD-10-CM | POA: Insufficient documentation

## 2014-02-05 DIAGNOSIS — Z833 Family history of diabetes mellitus: Secondary | ICD-10-CM | POA: Diagnosis not present

## 2014-02-05 DIAGNOSIS — G4700 Insomnia, unspecified: Secondary | ICD-10-CM | POA: Diagnosis present

## 2014-02-05 DIAGNOSIS — F329 Major depressive disorder, single episode, unspecified: Secondary | ICD-10-CM | POA: Diagnosis present

## 2014-02-05 DIAGNOSIS — F250 Schizoaffective disorder, bipolar type: Secondary | ICD-10-CM | POA: Diagnosis not present

## 2014-02-05 DIAGNOSIS — F251 Schizoaffective disorder, depressive type: Secondary | ICD-10-CM

## 2014-02-05 DIAGNOSIS — S31000A Unspecified open wound of lower back and pelvis without penetration into retroperitoneum, initial encounter: Secondary | ICD-10-CM | POA: Diagnosis present

## 2014-02-05 DIAGNOSIS — Z23 Encounter for immunization: Secondary | ICD-10-CM | POA: Diagnosis not present

## 2014-02-05 DIAGNOSIS — F17210 Nicotine dependence, cigarettes, uncomplicated: Secondary | ICD-10-CM | POA: Diagnosis present

## 2014-02-05 DIAGNOSIS — E119 Type 2 diabetes mellitus without complications: Secondary | ICD-10-CM | POA: Diagnosis present

## 2014-02-05 DIAGNOSIS — Z9119 Patient's noncompliance with other medical treatment and regimen: Secondary | ICD-10-CM | POA: Diagnosis present

## 2014-02-05 LAB — SALICYLATE LEVEL

## 2014-02-05 LAB — URINE MICROSCOPIC-ADD ON

## 2014-02-05 LAB — RAPID URINE DRUG SCREEN, HOSP PERFORMED
Amphetamines: NOT DETECTED
BARBITURATES: NOT DETECTED
Benzodiazepines: NOT DETECTED
Cocaine: NOT DETECTED
Opiates: NOT DETECTED
TETRAHYDROCANNABINOL: NOT DETECTED

## 2014-02-05 LAB — COMPREHENSIVE METABOLIC PANEL
ALK PHOS: 104 U/L (ref 39–117)
ALT: 14 U/L (ref 0–35)
AST: 17 U/L (ref 0–37)
Albumin: 3.6 g/dL (ref 3.5–5.2)
Anion gap: 15 (ref 5–15)
BILIRUBIN TOTAL: 0.2 mg/dL — AB (ref 0.3–1.2)
BUN: 8 mg/dL (ref 6–23)
CHLORIDE: 100 meq/L (ref 96–112)
CO2: 23 mEq/L (ref 19–32)
Calcium: 9.2 mg/dL (ref 8.4–10.5)
Creatinine, Ser: 0.72 mg/dL (ref 0.50–1.10)
GLUCOSE: 99 mg/dL (ref 70–99)
POTASSIUM: 3.7 meq/L (ref 3.7–5.3)
SODIUM: 138 meq/L (ref 137–147)
Total Protein: 7.7 g/dL (ref 6.0–8.3)

## 2014-02-05 LAB — URINALYSIS, ROUTINE W REFLEX MICROSCOPIC
Bilirubin Urine: NEGATIVE
Glucose, UA: NEGATIVE mg/dL
KETONES UR: NEGATIVE mg/dL
Nitrite: NEGATIVE
PROTEIN: NEGATIVE mg/dL
Specific Gravity, Urine: 1.022 (ref 1.005–1.030)
Urobilinogen, UA: 1 mg/dL (ref 0.0–1.0)
pH: 6.5 (ref 5.0–8.0)

## 2014-02-05 LAB — CBC WITH DIFFERENTIAL/PLATELET
Basophils Absolute: 0 10*3/uL (ref 0.0–0.1)
Basophils Relative: 0 % (ref 0–1)
Eosinophils Absolute: 0.2 10*3/uL (ref 0.0–0.7)
Eosinophils Relative: 1 % (ref 0–5)
HCT: 34.7 % — ABNORMAL LOW (ref 36.0–46.0)
HEMOGLOBIN: 11.7 g/dL — AB (ref 12.0–15.0)
LYMPHS ABS: 3.8 10*3/uL (ref 0.7–4.0)
LYMPHS PCT: 28 % (ref 12–46)
MCH: 29.5 pg (ref 26.0–34.0)
MCHC: 33.7 g/dL (ref 30.0–36.0)
MCV: 87.6 fL (ref 78.0–100.0)
Monocytes Absolute: 1.1 10*3/uL — ABNORMAL HIGH (ref 0.1–1.0)
Monocytes Relative: 8 % (ref 3–12)
Neutro Abs: 8.1 10*3/uL — ABNORMAL HIGH (ref 1.7–7.7)
Neutrophils Relative %: 63 % (ref 43–77)
PLATELETS: 341 10*3/uL (ref 150–400)
RBC: 3.96 MIL/uL (ref 3.87–5.11)
RDW: 14.7 % (ref 11.5–15.5)
WBC: 13.2 10*3/uL — ABNORMAL HIGH (ref 4.0–10.5)

## 2014-02-05 LAB — ACETAMINOPHEN LEVEL

## 2014-02-05 LAB — VALPROIC ACID LEVEL: Valproic Acid Lvl: <10 ug/mL — ABNORMAL LOW (ref 50.0–100.0)

## 2014-02-05 LAB — HCG, QUANTITATIVE, PREGNANCY: hCG, Beta Chain, Quant, S: <1 m[IU]/mL (ref <5)

## 2014-02-05 MED ORDER — HYDROXYZINE HCL 25 MG PO TABS
50.0000 mg | ORAL_TABLET | Freq: Every day | ORAL | Status: DC
  Administered 2014-02-05: 50 mg via ORAL
  Filled 2014-02-05: qty 2

## 2014-02-05 MED ORDER — ALUM & MAG HYDROXIDE-SIMETH 200-200-20 MG/5ML PO SUSP: 30.0000 mL | ORAL | Status: DC | PRN

## 2014-02-05 MED ORDER — IBUPROFEN 200 MG PO TABS: 600.0000 mg | ORAL_TABLET | Freq: Three times a day (TID) | ORAL | Status: DC | PRN

## 2014-02-05 MED ORDER — CHLORPROMAZINE HCL 25 MG PO TABS
50.0000 mg | ORAL_TABLET | Freq: Two times a day (BID) | ORAL | Status: DC
  Administered 2014-02-05 – 2014-02-06 (×2): 50 mg via ORAL
  Filled 2014-02-05 (×2): qty 2

## 2014-02-05 MED ORDER — LORAZEPAM 1 MG PO TABS
1.0000 mg | ORAL_TABLET | Freq: Three times a day (TID) | ORAL | Status: DC | PRN
  Administered 2014-02-05 – 2014-02-06 (×2): 1 mg via ORAL
  Filled 2014-02-05 (×2): qty 1

## 2014-02-05 MED ORDER — NICOTINE 21 MG/24HR TD PT24: 21.0000 mg | MEDICATED_PATCH | Freq: Every day | TRANSDERMAL | Status: DC

## 2014-02-05 MED ORDER — ZOLPIDEM TARTRATE 5 MG PO TABS
5.0000 mg | ORAL_TABLET | Freq: Every evening | ORAL | Status: DC | PRN
  Administered 2014-02-06: 5 mg via ORAL
  Filled 2014-02-05: qty 1

## 2014-02-05 MED ORDER — DIVALPROEX SODIUM ER 500 MG PO TB24
1000.0000 mg | ORAL_TABLET | Freq: Two times a day (BID) | ORAL | Status: DC
  Administered 2014-02-06 (×2): 1000 mg via ORAL
  Filled 2014-02-05 (×3): qty 2

## 2014-02-05 MED ORDER — ONDANSETRON HCL 4 MG PO TABS: 4.0000 mg | ORAL_TABLET | Freq: Three times a day (TID) | ORAL | Status: DC | PRN

## 2014-02-05 MED ORDER — NITROFURANTOIN MONOHYD MACRO 100 MG PO CAPS
100.0000 mg | ORAL_CAPSULE | Freq: Two times a day (BID) | ORAL | Status: DC
  Administered 2014-02-06 (×2): 100 mg via ORAL
  Filled 2014-02-05 (×4): qty 1

## 2014-02-05 MED ORDER — ACETAMINOPHEN 325 MG PO TABS
650.0000 mg | ORAL_TABLET | ORAL | Status: DC | PRN
  Administered 2014-02-06: 650 mg via ORAL
  Filled 2014-02-05: qty 2
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
## 2014-02-05 NOTE — ED Notes (Signed)
Pt stated she needed oxygen because she was having a hard time breathing. Pt refused to let me check her o2 level. Placed on 2L o2. MD, RN notified

## 2014-02-05 NOTE — ED Notes (Signed)
Pt refuses to talk to telepsych/TTS

## 2014-02-05 NOTE — ED Notes (Signed)
[facility] Expected date:  Expected time:  Means of arrival:  Comments: Hold for Principal FinancialJessica

## 2014-02-05 NOTE — BH Assessment (Signed)
Attempted to assess pt. She refused to participate. PA attempted to talk to pt about importance of assessment. Pt refused. PA is currently working on 1st opinion. ED will contact TTS if pt is willing to participate in assessment.   Clista BernhardtNancy Owens Hara, [facility] Triage Specialist 02/05/2014 10:01 PM

## 2014-02-05 NOTE — ED Notes (Signed)
Pt refusing to be connected to the heart monitor, states she only wants something to drink. MD, RN notified.

## 2014-02-05 NOTE — ED Provider Notes (Signed)
Medical screening examination/treatment/procedure(s) were performed by non-physician practitioner and as supervising physician I was immediately available for consultation/collaboration.   EKG Interpretation None        Richardean Canalavid H Yao, MD 02/05/14 2354

## 2014-02-05 NOTE — ED Notes (Addendum)
Pt given food and drink; pt resting at this time.  Pt has concerns about a raised darken area on upper buttock at the junction between the cheeks.  Joni ReiningNicole, PA advised.

## 2014-02-05 NOTE — ED Notes (Signed)
Pt states she smoked a cigarette today and started having a sore throat. States she is pregnant and wants to know what she is having. Her breasts are itching.

## 2014-02-06 ENCOUNTER — Inpatient Hospital Stay (HOSPITAL_COMMUNITY)
Admission: AD | Admit: 2014-02-06 | Discharge: 2014-02-20 | DRG: 885 | Disposition: A | Discharge status: Home / Self Care | Payer: Medicare Other | Source: Intra-hospital | Attending: Psychiatry | Admitting: Psychiatry

## 2014-02-06 DIAGNOSIS — F200 Paranoid schizophrenia: Secondary | ICD-10-CM

## 2014-02-06 DIAGNOSIS — G4700 Insomnia, unspecified: Secondary | ICD-10-CM | POA: Diagnosis present

## 2014-02-06 DIAGNOSIS — Z9119 Patient's noncompliance with other medical treatment and regimen: Secondary | ICD-10-CM | POA: Diagnosis present

## 2014-02-06 DIAGNOSIS — T814XXS Infection following a procedure, sequela: Secondary | ICD-10-CM

## 2014-02-06 DIAGNOSIS — Z590 Homelessness: Secondary | ICD-10-CM | POA: Diagnosis not present

## 2014-02-06 DIAGNOSIS — Z9114 Patient's other noncompliance with medication regimen: Secondary | ICD-10-CM | POA: Diagnosis present

## 2014-02-06 DIAGNOSIS — X58XXXA Exposure to other specified factors, initial encounter: Secondary | ICD-10-CM | POA: Diagnosis present

## 2014-02-06 DIAGNOSIS — F329 Major depressive disorder, single episode, unspecified: Secondary | ICD-10-CM | POA: Diagnosis present

## 2014-02-06 DIAGNOSIS — R4689 Other symptoms and signs involving appearance and behavior: Secondary | ICD-10-CM

## 2014-02-06 DIAGNOSIS — Z23 Encounter for immunization: Secondary | ICD-10-CM | POA: Diagnosis not present

## 2014-02-06 DIAGNOSIS — F17210 Nicotine dependence, cigarettes, uncomplicated: Secondary | ICD-10-CM | POA: Diagnosis present

## 2014-02-06 DIAGNOSIS — A630 Anogenital (venereal) warts: Secondary | ICD-10-CM

## 2014-02-06 DIAGNOSIS — Z833 Family history of diabetes mellitus: Secondary | ICD-10-CM | POA: Diagnosis not present

## 2014-02-06 DIAGNOSIS — S31000A Unspecified open wound of lower back and pelvis without penetration into retroperitoneum, initial encounter: Secondary | ICD-10-CM | POA: Diagnosis present

## 2014-02-06 DIAGNOSIS — IMO0001 Reserved for inherently not codable concepts without codable children: Secondary | ICD-10-CM

## 2014-02-06 DIAGNOSIS — F17200 Nicotine dependence, unspecified, uncomplicated: Secondary | ICD-10-CM

## 2014-02-06 DIAGNOSIS — E119 Type 2 diabetes mellitus without complications: Secondary | ICD-10-CM | POA: Diagnosis present

## 2014-02-06 DIAGNOSIS — A5901 Trichomonal vulvovaginitis: Secondary | ICD-10-CM

## 2014-02-06 DIAGNOSIS — B373 Candidiasis of vulva and vagina: Secondary | ICD-10-CM

## 2014-02-06 DIAGNOSIS — F250 Schizoaffective disorder, bipolar type: Secondary | ICD-10-CM | POA: Diagnosis not present

## 2014-02-06 DIAGNOSIS — B3731 Acute candidiasis of vulva and vagina: Secondary | ICD-10-CM

## 2014-02-06 DIAGNOSIS — N390 Urinary tract infection, site not specified: Secondary | ICD-10-CM | POA: Diagnosis present

## 2014-02-06 DIAGNOSIS — F258 Other schizoaffective disorders: Secondary | ICD-10-CM | POA: Diagnosis not present

## 2014-02-06 DIAGNOSIS — Z209 Contact with and (suspected) exposure to unspecified communicable disease: Secondary | ICD-10-CM

## 2014-02-06 DIAGNOSIS — F259 Schizoaffective disorder, unspecified: Secondary | ICD-10-CM | POA: Diagnosis not present

## 2014-02-06 DIAGNOSIS — R21 Rash and other nonspecific skin eruption: Secondary | ICD-10-CM

## 2014-02-06 DIAGNOSIS — Z8659 Personal history of other mental and behavioral disorders: Secondary | ICD-10-CM

## 2014-02-06 DIAGNOSIS — F209 Schizophrenia, unspecified: Secondary | ICD-10-CM | POA: Diagnosis not present

## 2014-02-06 DIAGNOSIS — F29 Unspecified psychosis not due to a substance or known physiological condition: Secondary | ICD-10-CM | POA: Diagnosis present

## 2014-02-06 DIAGNOSIS — N912 Amenorrhea, unspecified: Secondary | ICD-10-CM

## 2014-02-06 MED ORDER — NITROFURANTOIN MONOHYD MACRO 100 MG PO CAPS
100.0000 mg | ORAL_CAPSULE | Freq: Two times a day (BID) | ORAL | Status: DC
  Administered 2014-02-07 – 2014-02-20 (×27): 100 mg via ORAL
  Filled 2014-02-06 (×32): qty 1

## 2014-02-06 MED ORDER — ACETAMINOPHEN 325 MG PO TABS
650.0000 mg | ORAL_TABLET | Freq: Four times a day (QID) | ORAL | Status: DC | PRN
  Administered 2014-02-14 – 2014-02-17 (×3): 650 mg via ORAL
  Filled 2014-02-06 (×3): qty 2

## 2014-02-06 MED ORDER — NITROFURANTOIN MONOHYD MACRO 100 MG PO CAPS: 100.0000 mg | ORAL_CAPSULE | Freq: Two times a day (BID) | ORAL | Status: DC

## 2014-02-06 MED ORDER — CHLORPROMAZINE HCL 50 MG PO TABS
50.0000 mg | ORAL_TABLET | Freq: Two times a day (BID) | ORAL | Status: DC
  Administered 2014-02-06 – 2014-02-07 (×2): 50 mg via ORAL
  Filled 2014-02-06: qty 2
  Filled 2014-02-06 (×5): qty 1
  Filled 2014-02-06: qty 2
  Filled 2014-02-06: qty 1

## 2014-02-06 MED ORDER — TRAZODONE HCL 50 MG PO TABS: 50.0000 mg | ORAL_TABLET | Freq: Every evening | ORAL | Status: DC | PRN

## 2014-02-06 MED ORDER — CHLORPROMAZINE HCL 50 MG PO TABS: 50.0000 mg | ORAL_TABLET | Freq: Two times a day (BID) | ORAL | Status: DC

## 2014-02-06 MED ORDER — DIVALPROEX SODIUM ER 500 MG PO TB24: 1000.0000 mg | ORAL_TABLET | Freq: Two times a day (BID) | ORAL | Status: DC

## 2014-02-06 MED ORDER — NICOTINE 21 MG/24HR TD PT24
21.0000 mg | MEDICATED_PATCH | Freq: Every day | TRANSDERMAL | Status: DC
  Administered 2014-02-07: 21 mg via TRANSDERMAL
  Filled 2014-02-06 (×4): qty 1

## 2014-02-06 MED ORDER — HYDROXYZINE HCL 50 MG PO TABS: 50.0000 mg | ORAL_TABLET | Freq: Every day | ORAL | Status: DC

## 2014-02-06 MED ORDER — HYDROXYZINE HCL 50 MG PO TABS
50.0000 mg | ORAL_TABLET | Freq: Every day | ORAL | Status: DC
  Administered 2014-02-06 – 2014-02-19 (×14): 50 mg via ORAL
  Filled 2014-02-06 (×8): qty 1
  Filled 2014-02-06: qty 3
  Filled 2014-02-06 (×9): qty 1

## 2014-02-06 MED ORDER — MAGNESIUM HYDROXIDE 400 MG/5ML PO SUSP: 30.0000 mL | Freq: Every day | ORAL | Status: DC | PRN

## 2014-02-06 MED ORDER — ALUM & MAG HYDROXIDE-SIMETH 200-200-20 MG/5ML PO SUSP: 30.0000 mL | ORAL | Status: DC | PRN

## 2014-02-06 MED ORDER — DIVALPROEX SODIUM ER 500 MG PO TB24
1000.0000 mg | ORAL_TABLET | Freq: Two times a day (BID) | ORAL | Status: DC
  Administered 2014-02-07 – 2014-02-09 (×6): 1000 mg via ORAL
  Filled 2014-02-06 (×10): qty 2

## 2014-02-06 MED ORDER — TRAZODONE HCL 100 MG PO TABS
100.0000 mg | ORAL_TABLET | Freq: Every evening | ORAL | Status: DC | PRN
  Administered 2014-02-06 – 2014-02-20 (×20): 100 mg via ORAL
  Filled 2014-02-06 (×7): qty 1
  Filled 2014-02-06: qty 6
  Filled 2014-02-06 (×19): qty 1
  Filled 2014-02-06: qty 6
  Filled 2014-02-06 (×7): qty 1

## 2014-02-06 NOTE — BH Assessment (Signed)
Patient on list for [facility], no beds as of this moment yet discharges expected. AC E Kaplan will keep TTS informed.  Catherine C Harrill, [name]  

## 2014-02-06 NOTE — ED Notes (Signed)
Patient has been guarded but appropriate this shift.  She was loud and irritable on phone but redirected easily.  Compliant with scheduled medications.  Continues to have fixed delusion of pregnancy even though she is menstruating. Denies SI/HI.

## 2014-02-06 NOTE — Progress Notes (Signed)
[facility]  Pt refused to answer some questions when writer assessed pt.  Pt complained of generalized pain of 5/10 and Tylenol 650mg  PO PRN was administered.  Pt denies SI, HI, and hallucinations.  Pt provided with snack and beverage.  Pt is resting in room and is in no distress.  Respirations are even, unlabored, WNL.  Will continue to monitor and assess for safety.

## 2014-02-06 NOTE — ED Notes (Signed)
Pt is resting in room with eyes closed.  Pt is in no distress.  Respirations even, unlabored, WNL.  Will continue to monitor and assess for safety .
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
## 2014-02-07 ENCOUNTER — Encounter (HOSPITAL_COMMUNITY): Payer: Self-pay | Admitting: *Deleted

## 2014-02-07 DIAGNOSIS — F258 Other schizoaffective disorders: Secondary | ICD-10-CM | POA: Diagnosis not present

## 2014-02-07 DIAGNOSIS — F200 Paranoid schizophrenia: Secondary | ICD-10-CM

## 2014-02-07 LAB — GLUCOSE, CAPILLARY: Glucose-Capillary: 99 mg/dL (ref 70–99)

## 2014-02-07 MED ORDER — NICOTINE POLACRILEX 2 MG MT GUM
2.0000 mg | CHEWING_GUM | OROMUCOSAL | Status: DC | PRN
  Administered 2014-02-07 – 2014-02-20 (×23): 2 mg via ORAL
  Filled 2014-02-07 (×4): qty 1
  Filled 2014-02-07: qty 2
  Filled 2014-02-07 (×16): qty 1

## 2014-02-07 MED ORDER — INFLUENZA VAC SPLIT QUAD 0.5 ML IM SUSY
0.5000 mL | PREFILLED_SYRINGE | Freq: Once | INTRAMUSCULAR | Status: AC
  Administered 2014-02-07: 0.5 mL via INTRAMUSCULAR
  Filled 2014-02-07: qty 0.5

## 2014-02-07 MED ORDER — OLANZAPINE 10 MG PO TBDP
10.0000 mg | ORAL_TABLET | Freq: Every day | ORAL | Status: DC
  Administered 2014-02-07: 10 mg via ORAL
  Filled 2014-02-07 (×4): qty 1

## 2014-02-07 MED ORDER — PNEUMOCOCCAL VAC POLYVALENT 25 MCG/0.5ML IJ INJ
0.5000 mL | INJECTION | Freq: Once | INTRAMUSCULAR | Status: AC
  Administered 2014-02-07: 0.5 mL via INTRAMUSCULAR

## 2014-02-07 MED ORDER — OLANZAPINE 10 MG PO TBDP: 5.0000 mg | ORAL_TABLET | Freq: Two times a day (BID) | ORAL | Status: DC | PRN

## 2014-02-07 MED ORDER — OLANZAPINE 10 MG PO TBDP
10.0000 mg | ORAL_TABLET | Freq: Two times a day (BID) | ORAL | Status: DC | PRN
  Administered 2014-02-07: 10 mg via ORAL
  Filled 2014-02-07: qty 1

## 2014-02-07 NOTE — Progress Notes (Signed)
Patient ID: Molly [name], female   DOB: [birth year], 28 y.o.   MRN: [id number] PER STATE REGULATIONS 482.30  THIS CHART WAS REVIEWED FOR MEDICAL NECESSITY WITH RESPECT TO THE PATIENT'S ADMISSION/ DURATION OF STAY.  NEXT REVIEW DATE: 02/10/2014  Willa RoughJENNIFER JONES Zaevion Parke, RN, BSN CASE MANAGER
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
## 2014-02-07 NOTE — MAU Provider Note (Signed)
Attestation of Attending Supervision of Advanced Practitioner (CNM/NP): Evaluation and management procedures were performed by the Advanced Practitioner under my supervision and collaboration. I have reviewed the Advanced Practitioner's note and chart, and I agree with the management and plan.  LEGGETT,KELLY H. 10:10 AM

## 2014-02-07 NOTE — Plan of Care (Signed)
Problem: Ineffective individual coping Goal: STG-Increase in ability to manage activities of daily living Outcome: Not Met (add Reason) Patient has to be repeatedly asked to clean up after herself and change her clothes and bed sheets due to them being visibly soiled. Patient at this point is not able to perform all ADL's as necessary.

## 2014-02-07 NOTE — Tx Team (Signed)
  Interdisciplinary Treatment Plan Update   Date Reviewed:  02/07/2014  Time Reviewed:  10:52 AM  Progress in Treatment:   Attending groups: Yes Participating in groups: Unable to due to psychosis Taking medication as prescribed: Yes  Tolerating medication: Yes Family/Significant other contact made: No  Patient understands diagnosis: No  Limited insight Discussing patient identified problems/goals with staff: Yes  See initial care plan Medical problems stabilized or resolved: Yes Denies suicidal/homicidal ideation: Yes  In tx team Patient has not harmed self or others: Yes  For review of initial/current patient goals, please see plan of care.  Estimated Length of Stay:  4-5 days  Reason for Continuation of Hospitalization: Delusions  Hallucinations Medication stabilization Other; describe Paranoia  New Problems/Goals identified:  N/A  Discharge Plan or Barriers:   unknown  Additional Comments:  Pt was hospitalized at [facility] several weeks ago for same symptoms.  Referred to ACT team.  Did not follow through with appointments and was not medication compliant.  She was living at the half way house of [name], but due to her unwillingness to follow the treatment plan and her sexualized behavior, she was asked to leave.  Hopefully we can stabilize her on meds, connect her with the ACT team and find her stable living situation.  Attendees:  Signature: Ivin BootySarama Eappen, MD 02/07/2014 10:52 AM   Signature: Richelle Itood Louellen Haldeman, [name] 02/07/2014 10:52 AM  Signature:  02/07/2014 10:52 AM  Signature: Marzetta Boardhrista Dopson, RN 02/07/2014 10:52 AM  Signature:  02/07/2014 10:52 AM  Signature:  02/07/2014 10:52 AM  Signature:   02/07/2014 10:52 AM  Signature:    Signature:    Signature:    Signature:    Signature:    Signature:      Scribe for Treatment Team:   [employer]uc[employer] Corporationod Alessandro Griep, [name]  02/07/2014 10:52 AM
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
## 2014-02-07 NOTE — [facility] Suicide Risk Assessment (Signed)
ADMISSION SUICIDE RISK ASSESSMENT   Nursing information obtained from:    Demographic factors:  Low socioeconomic status;Living alone;Unemployed Current Mental Status:  NA Loss Factors:  Financial problems / change in socioeconomic status Historical Factors:  Family history of mental illness or substance abuse Risk Reduction Factors:  Responsible for children under 27 years of age;Sense of responsibility to family;Religious beliefs about death Total Time spent with patient: 45 minutes  CLINICAL FACTORS:  Schizophrenia, psychosis  Psychiatric Specialty Exam: Physical Exam  ROS  Blood pressure 85/64, pulse 106, temperature 98.6 F (37 C), temperature source Oral, resp. rate 18.There is no weight on file to calculate BMI.  See Admit Note MSE   COGNITIVE FEATURES THAT CONTRIBUTE TO RISK:  Closed-mindedness Loss of executive function    SUICIDE RISK:   Moderate:  Frequent suicidal ideation with limited intensity, and duration, some specificity in terms of plans, no associated intent, good self-control, limited dysphoria/symptomatology, some risk factors present, and identifiable protective factors, including available and accessible social support.  PLAN OF CARE: Patient will be admitted to inpatient psychiatric unit for stabilization and safety. Will provide and encourage milieu participation. Provide medication management and maked adjustments as needed.  Will follow daily.    I certify that inpatient services furnished can reasonably be expected to improve the patient's condition.  Molly [name] 02/07/2014, 3:32 PM

## 2014-02-07 NOTE — Tx Team (Signed)
Initial Interdisciplinary Treatment Plan   PATIENT STRESSORS: Marital or family conflict Medication change or noncompliance   PROBLEM LIST: Problem List/Patient Goals Date to be addressed Date deferred Reason deferred Estimated date of resolution  "help me to Keep smoking cigs" 02/06/14     "Help to accomplish my goals and be somebody" 02/06/14     "Help to stop stuttering" 02/06/14     "Help to get stabilized with my meds" 02/06/14     Alteration in thought process 02/06/14     Increased risk for suicide 02/06/14                        DISCHARGE CRITERIA:  Ability to meet basic life and health needs Adequate post-discharge living arrangements Improved stabilization in mood, thinking, and/or behavior Medical problems require only outpatient monitoring Motivation to continue treatment in a less acute level of care Need for constant or close observation no longer present Reduction of life-threatening or endangering symptoms to within safe limits Safe-care adequate arrangements made Verbal commitment to aftercare and medication compliance  PRELIMINARY DISCHARGE PLAN: Outpatient therapy Participate in family therapy Placement in alternative living arrangements  PATIENT/FAMIILY INVOLVEMENT: This treatment plan has been presented to and reviewed with the patient, Molly [name], and/or family member.  The patient and family have been given the opportunity to ask questions and make suggestions.  Fransico MichaelBrooks, Lowanda Cashaw Laverne 02/07/2014, 5:54 AM

## 2014-02-07 NOTE — Clinical Social Work Note (Signed)
CSW Intern was unable to complete PSA due to the pt's limited ability to carry on a meaningful conversation.  [name] [name]  02/07/2014 11:36 AM

## 2014-02-07 NOTE — [facility] Group Notes (Signed)
[facility] Group Notes:  (Counselor/Nursing/MHT/Case Management/Adjunct)  02/07/2014 1:15PM  Type of Therapy:  Group Therapy  Participation Level:  Active  Participation Quality:  Appropriate  Affect:  Flat  Cognitive:  Oriented  Insight:  Improving  Engagement in Group:  Limited  Engagement in Therapy:  Limited  Modes of Intervention:  Discussion, Exploration and Socialization  Summary of Progress/Problems: The topic for group was balance in life.  Pt participated in the discussion about when their life was in balance and out of balance and how this feels.  Pt discussed ways to get back in balance and short term goals they can work on to get where they want to be.  Went in and out of group multiple times.  Had to be redirected when she tried to get under the covers with another patient and then kept switching chairs.  In response to question about what helps her when she gets angry: "Corrie DandyMary was crucified on the cross."  Limited insight, poor boundaries, RIS.   Daryel Geraldorth, Orlen Leedy B 02/07/2014 12:32 PM

## 2014-02-07 NOTE — Progress Notes (Signed)
Patient ID: Molly FreshJessica S [name], female   DOB: 09/12/1985, 28 y.o.   MRN: [id number]  D: Pt. 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 Patient is quite childlike in interaction. When writer first presented to patient's room it was noted that patient had a soiled pad on her table by her bed. Writer encouraged patient to discard pads for sanitary reasons and patient was cooperative in disposing. Patient did not turn in daily inventory sheet today although encouraged.  A: Support and encouragement provided to the patient. Scheduled medications are administered to patient per physician's orders. Patient received PRN Zyprexa for agitation and it decreased upon reassessment.  R: Patient is receptive and cooperative but child like. Patient requests that writer place diaper on patient. Writer has set boundaries with patient. Patient is physically capable of taking care of her needs at this time. Q15 minute checks are maintained for safety.

## 2014-02-08 LAB — LIPID PANEL
CHOLESTEROL: 107 mg/dL (ref 0–200)
HDL: 39 mg/dL — AB (ref >39)
LDL Cholesterol: 53 mg/dL (ref 0–99)
TRIGLYCERIDES: 77 mg/dL (ref <150)
Total CHOL/HDL Ratio: 2.7 RATIO
VLDL: 15 mg/dL (ref 0–40)

## 2014-02-08 LAB — GLUCOSE, CAPILLARY: Glucose-Capillary: 104 mg/dL — ABNORMAL HIGH (ref 70–99)

## 2014-02-08 MED ORDER — OLANZAPINE 5 MG PO TBDP
15.0000 mg | ORAL_TABLET | Freq: Every day | ORAL | Status: DC
  Administered 2014-02-08 – 2014-02-09 (×2): 15 mg via ORAL
  Filled 2014-02-08 (×6): qty 1

## 2014-02-08 NOTE — Progress Notes (Signed)
Patient ID: Molly [name], female   DOB: 31-May-1985, 28 y.o.   MRN: [id number] Pt refused to accept urine cup for specimen, paranoid stating '' I know that the medications are doing this to me. They are poisoning me to make my urine bad I'm not taking any more antibiotic pills'' attempted to re-educate patient but patient unable to process.

## 2014-02-08 NOTE — [facility] Group Notes (Signed)
[facility] [name] Group Therapy  02/08/2014  1:05 PM  Type of Therapy:  Group therapy  Participation Level:  Active  Participation Quality:  Attentive  Affect:  Flat  Cognitive:  Oriented  Insight:  Limited  Engagement in Therapy:  Limited  Modes of Intervention:  Discussion, Socialization  Summary of Progress/Problems:  Chaplain was here to lead a group on themes of hope and courage.  "My name is [place].  My name is Data processing manageratience.  You can call me Jess."  Limited insight.  Not disruptive.  Left abruptly close to the end of group.  Daryel Geraldorth, Marteze Vecchio B 02/08/2014 1:34 PM

## 2014-02-08 NOTE — [facility] Counselor (Signed)
Adult Comprehensive Assessment  Patient ID: Molly FreshJessica S [name], female   DOB: November 20, 1985, 28 y.o.   MRN: [id number]  Information Source: Information source: Patient  Current Stressors:  Family Relationships: Limited family support  Financial / Lack of resources (include bankruptcy): Limited income.  Housing / Lack of housing: Lack of housing  Social relationships: Limited social supports   Living/Environment/Situation:  Living Arrangements: Alone Living conditions (as described by patient or guardian): Shelter How long has patient lived in current situation?: Refused to answer  What is atmosphere in current home: Temporary  Family History:  Marital status: Single Does patient have children?: Yes How many children?: 3 How is patient's relationship with their children?: 3 boys. "old enough."   Childhood History:  By whom was/is the patient raised?: Adoptive parents Additional childhood history information: Newborn  Description of patient's relationship with caregiver when they were a child: Refused to answer anymore questions regarding childhood.  Patient's description of current relationship with people who raised him/her: No contact.   Education:  Highest grade of school patient has completed: Maybe 12th.  Currently a student?: No Learning disability?: No  Employment/Work Situation:   Employment situation: On disability Why is patient on disability: I dont remember  How long has patient been on disability: I dont remember  Patient's job has been impacted by current illness: No What is the longest time patient has a held a job?: I dont remember  Where was the patient employed at that time?: I dont remember  Has patient ever been in the [employer] and Companymilitary?: No Has patient ever served in Buyer, retailcombat?: No  Financial Resources:   Surveyor, quantityinancial resources: [employer]ceives SSI;Medicare Does patient have a representative payee or guardian?: No  Alcohol/Substance Abuse:   What has been your use of  drugs/alcohol within the last 12 months?: Refused to answer  Alcohol/Substance Abuse Treatment Hx: Denies past history  Social Support System:   Forensic psychologistatient's Community Support System: None Describe Community Support System: None.  Type of faith/religion: N/A  How does patient's faith help to cope with current illness?: N/A   Leisure/Recreation:   Leisure and Hobbies: Unable to answer  Strengths/Needs:   What things does the patient do well?: Unable to answer In what areas does patient struggle / problems for patient: Unable to answer  Discharge Plan:   Does patient have access to transportation?: Yes (bus ) Will patient be returning to same living situation after discharge?: No Plan for living situation after discharge: Shelter Currently receiving community mental health services: No If no, would patient like referral for services when discharged?: No Does patient have financial barriers related to discharge medications?: Yes Patient description of barriers related to discharge medications: Limted Income   Summary/Recommendations:   Molly [name] is a 28 year old female [facility]. [facility] Pt states [facility] but was kicked out. Pt was irritable and refusing to provide much information. She was unwilling to give information regarding her childhood, supports or substance use. Pt reports she receives SSI and Medicare. Upon discharge she states she wants to return to a shelter and does not want to receive outpatient services. Recommendations include medication management, crisis stabilization, therapeutic milieu and encourage group attendance and participation.   [name],[name]. 02/08/2014

## 2014-02-08 NOTE — Progress Notes (Signed)
[facility]  02/08/2014 3:24 PM Levester FreshJessica S Jernigan  MRN:  [id number] Subjective:  Patient states she feels " the same". Objective: I have discussed case with treatment team/nursing staff. Patient has made some progress but remains disorganized and intermittently irritable and angry. At this  Time patient remains disorganized in thought process and remains guarded and suspicious. There is slight improvement in eye contact. She also, although still paranoid, seems less concerned about pregnancy issues and today states " I do not know if I am pregnant or not" rather than insisting that she is as yesterday. Abscess on back has been  Identified by nursing staff and is draining. Patient has no fever , no chills, and does not appear toxic. Wound Nursing consult has been requested. No medication side effects endorsed. 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  Respiratory: Negative for cough.   Cardiovascular: Negative for chest pain.  Gastrointestinal: Negative for vomiting and abdominal pain.  Skin: Negative.  Negative for rash.       Abscess on lower back area, draining- as reported by nursing.  Psychiatric/Behavioral: Positive for depression.       (+) psychosis and thought disorder    Blood pressure 110/79, pulse 96, temperature 98.8 F (37.1 C), temperature source Oral, resp. rate 16.There is no weight on file to calculate BMI.  General Appearance: Fairly Groomed  Patent attorneyye Contact::  but improved compared to admission  Speech:  Slow  Volume:  variable   Mood:  Irritable  Affect:  blunted, restricted, intermittently irritable   Thought Process:  Disorganized  Orientation:  Other:  difficult to assess due to limited cooperation from patient. Does not appear delirious or grossly confused/disoriented  Thought Content:  denies hallucinations and does not appear internally preoccupied- she does continue to exhibit paranoid ideations, but is less focused on pregnancy concerns  Suicidal Thoughts:  No- denies any current suicidal or homicidal plans/intent  Homicidal Thoughts:  No  Memory:  difficult to assess- grossly intact   Judgement:  Impaired  Insight:  Lacking  Psychomotor Activity:  Normal  Concentration:  Fair  Recall:  Good  Fund of Knowledge:Good  Language: Fair  Akathisia:  No  Handed:  Right  AIMS (if indicated):     Assets:  Desire for Improvement Resilience  Sleep:  Number of Hours: 6.5   Musculoskeletal: Strength & Muscle Tone: within normal limits Gait & Station: normal Patient leans: N/A  Current Medications: Current Facility-Administered Medications  Medication Dose Route Frequency Provider Last Rate Last Dose  . acetaminophen (TYLENOL) tablet 650 mg  650 mg Oral Q6H PRN Kerry HoughSpencer E Simon, PA-C      . alum & mag hydroxide-simeth (MAALOX/MYLANTA) 200-200-20 MG/5ML suspension 30 mL  30 mL Oral Q4H PRN Kerry HoughSpencer E Simon, PA-C      . divalproex (DEPAKOTE ER) 24 hr tablet 1,000 mg  1,000 mg Oral BID PC Shuvon Rankin, NP   1,000 mg at 02/08/14 1149  . hydrOXYzine (ATARAX/VISTARIL) tablet 50 mg  50 mg Oral QHS Shuvon Rankin, NP   50 mg at 02/07/14 2131  . magnesium hydroxide (MILK OF MAGNESIA) suspension 30 mL  30 mL Oral Daily PRN Shuvon Rankin, NP      . nicotine polacrilex (NICORETTE) gum 2 mg  2 mg Oral PRN Jomarie LongsSaramma Eappen, MD   2 mg at 02/07/14 1551  . nitrofurantoin (macrocrystal-monohydrate) (MACROBID) capsule 100 mg  100 mg Oral BID Shuvon Rankin, NP   100 mg at 02/08/14 1150  . OLANZapine zydis (ZYPREXA) disintegrating tablet 10 mg  10 mg Oral QHS Nehemiah MassedFernando Wiliam Cauthorn, MD   10 mg at 02/07/14 2131  .  OLANZapine zydis (ZYPREXA) disintegrating tablet 5 mg  5 mg Oral BID PRN Nehemiah MassedFernando Pete Merten, MD      . traZODone (DESYREL) tablet 100 mg  100 mg Oral QHS,MR X 1 Kerry HoughSpencer E Simon, PA-C   100 mg at 02/07/14 2131    Lab Results:  Results for orders placed during the hospital encounter of 02/06/14 (from the past 48 hour(s))  GLUCOSE, CAPILLARY     Status: None   Collection Time    02/07/14  6:20 AM      Result Value Ref Range   Glucose-Capillary 99  70 - 99 mg/dL   Comment 1 Notify RN    GLUCOSE, CAPILLARY     Status: Abnormal   Collection Time    02/08/14  6:27 AM      Result Value Ref Range   Glucose-Capillary 104 (*) 70 - 99 mg/dL    Physical Findings: AIMS: Facial and Oral Movements Muscles of Facial Expression: None, normal Lips and Perioral Area: None, normal Jaw: None, normal Tongue: None, normal,Extremity Movements Upper (arms, wrists, hands, fingers): None, normal Lower (legs, knees, ankles, toes): None, normal, Trunk Movements Neck, shoulders, hips: None, normal, Overall Severity Severity of abnormal movements (highest score from questions above): None, normal Incapacitation due to abnormal movements: None, normal Patient's awareness of abnormal movements (rate only patient's report): No Awareness,    CIWA:  CIWA-Ar Total: 2 COWS:     Assessment-  Patient remains disorganized , guarded, intermittently irritable and angry. She is only superficially cooperative, but does present with some improvement, compared to admission, to include improved eye contact, decreased concern about pregnancy. No side effects from medications thus far.   Treatment Plan Summary: Daily contact with patient to assess and evaluate symptoms and progress in treatment Medication management See below   Plan: Continue inpatient treatment. Increase Zyprexa Zydis to 15 mgrs QHS   Medical Decision Making Problem Points:  Established problem, stable/improving (1), Review of last therapy session (1)  and Review of psycho-social stressors (1) Data Points:  Review or order clinical lab tests (1) Review of medication regiment & side effects (2) Review of new medications or change in dosage (2)  I certify that inpatient services furnished can reasonably be expected to improve the patient's condition.   Maricela Kawahara 02/08/2014, 3:24 PM

## 2014-02-08 NOTE — [facility] Group Notes (Signed)
[facility] [name] Aftercare Discharge Planning Group Note   02/08/2014 10:23 AM  Participation Quality:  Invited  Did not attend    Anthon Harpole B   

## 2014-02-08 NOTE — Progress Notes (Signed)
Morning CBG - 105

## 2014-02-08 NOTE — Progress Notes (Signed)
[facility] Group Notes:  (Nursing/MHT/Case Management/Adjunct)  Date:  02/08/2014  Time:  11:07 PM  Type of Therapy:  Group Therapy  Participation Level:  Did Not Attend  Participation Quality:  Did Not Attend  Affect:  Did Not Attend  Cognitive:  Did Not Attend  Insight:  None  Engagement in Group:  Did Not Attend  Modes of Intervention:  Socialization and Support  Summary of Progress/Problems: Pt. Was talking with staff during group.  Sondra ComeWilson, Vadim Centola J 02/08/2014, 11:07 PM

## 2014-02-08 NOTE — Progress Notes (Signed)
Patient ID: Molly [name], female   DOB: 08-21-1985, 28 y.o.   MRN: [id number] D. Pt presents with irritable mood, affect labile. In early am patient refused medications refusing to get out of bed. [name] continues to have poor hygiene. She was noted to have large blood stain on pants and was encouraged to change clothes and shower - Clinical research associatewriter provided hospital scrubs. Patient proceeded to change in middle of the dayroom and when she was redirected by staff that she needed to change in room she began cursing and screaming . '' don't let that bitch tell me what to do. [place] I'll do what I fucking want. I'll rip her weave out ! '' Patient proceeded to escalate and was posturing towards peer. She was able to be redirected verbally, and was able to contract for safety . She continues to have inappropriate boundaries with staff and peers at times, and unable to tolerate room mate at this time. Pt also complained of abcess - writer noted large draining wound to upper buttocks/lower back . Large amounts of purulent drainage noted. Mervyn SkeetersA. Writer notified MD of above information. Medications given as ordered. Pt refused self inventory. R. Patient is safe. Will continue to monitor q 15 minutes for safety.

## 2014-02-08 NOTE — Progress Notes (Signed)
D. Pt was up and visible in milieu this evening, attending and participating in various activities. Pt did get into verbal altercation with another pt this evening, however was able to respond appropriately to re-direction. Pt did receive medications this evening without incident. A. Support and encouragement provided, medication education given. R. Pt verbalized understanding, safety maintained.

## 2014-02-08 NOTE — Progress Notes (Signed)
D. Pt has been up and visible in milieu this evening, pt has gotten into verbal altercations with 2 other peers in regards to her personal hygiene and pt needed re-direction to help calm down. Pt was assisted with personal hygiene care and also provided with education in regards to hygiene and reminded to ask for help if she needed any. A. Pt responded appropriately to re-direction and verbalized understanding of hygiene care. R. Safety maintained, will continue to monitor.

## 2014-02-09 LAB — URINALYSIS W MICROSCOPIC (NOT AT ARMC)
Glucose, UA: NEGATIVE mg/dL
Ketones, ur: 40 mg/dL — AB
Nitrite: POSITIVE — AB
Protein, ur: 100 mg/dL — AB
Specific Gravity, Urine: 1.018 (ref 1.005–1.030)
UROBILINOGEN UA: 1 mg/dL (ref 0.0–1.0)
pH: 7 (ref 5.0–8.0)

## 2014-02-09 LAB — HEMOGLOBIN A1C
Hgb A1c MFr Bld: 6.2 % — ABNORMAL HIGH (ref <5.7)
Mean Plasma Glucose: 131 mg/dL — ABNORMAL HIGH (ref <117)

## 2014-02-09 LAB — PROLACTIN: Prolactin: 43.4 ng/mL

## 2014-02-09 MED ORDER — ALBUTEROL SULFATE HFA 108 (90 BASE) MCG/ACT IN AERS
2.0000 | INHALATION_SPRAY | Freq: Four times a day (QID) | RESPIRATORY_TRACT | Status: DC | PRN
  Administered 2014-02-09 – 2014-02-19 (×16): 2 via RESPIRATORY_TRACT

## 2014-02-09 MED ORDER — CEPHALEXIN 500 MG PO CAPS
500.0000 mg | ORAL_CAPSULE | Freq: Four times a day (QID) | ORAL | Status: DC
  Filled 2014-02-09 (×4): qty 1

## 2014-02-09 MED ORDER — IBUPROFEN 600 MG PO TABS
600.0000 mg | ORAL_TABLET | Freq: Four times a day (QID) | ORAL | Status: DC | PRN
  Administered 2014-02-09 – 2014-02-12 (×3): 600 mg via ORAL
  Filled 2014-02-09 (×3): qty 3

## 2014-02-09 MED ORDER — SULFAMETHOXAZOLE-TMP DS 800-160 MG PO TABS
1.0000 | ORAL_TABLET | Freq: Two times a day (BID) | ORAL | Status: DC
  Administered 2014-02-09 – 2014-02-20 (×22): 1 via ORAL
  Filled 2014-02-09 (×29): qty 1

## 2014-02-09 NOTE — Progress Notes (Addendum)
Called by psychiatry consultation for elevated prolactin  Called and discussed with Dr. Elna BreslowEappen, patient is psychotic at this time. Will not be able to cooperate for MRI brain, recommended to wait till the psychotic episode is over and then MRI can be done inpatient versus outpatient depending on the patient's cooperation.  We'll sign off at this time, consult us  again if needed.

## 2014-02-09 NOTE — Progress Notes (Signed)
Patient ID: [name], female   DOB: [birth year], 28 y.o.   MRN: [id number] Psychoeducational Group Note  Date:  02/09/2014 Time:1000am  Group Topic/Focus:  Identifying Needs:   The focus of this group is to help patients identify their personal needs that have been historically problematic and identify healthy behaviors to address their needs.  Participation Level:  Did Not Attend  Participation Quality:    Affect: Cognitive:  Insight: Engagement in Group:  Additional Comments:  Did not attend healthy coping skills group   Valente DavidWeaver, Shailene Demonbreun Brooks 02/09/2014,9:59 AM

## 2014-02-09 NOTE — Progress Notes (Signed)
Patient ID: Molly [name], female   DOB: January 27, 1986, 28 y.o.   MRN: [id number] 02-09-14 nursing shift note: d: pt refused to complete her inventory sheet and would not answer the questions, so the RN could assist her in filling out the sheet. Also her no roommate order was renewed due to lability and aggression with staff, as well as poor hygiene. She continues to refuse to take a bath. She is having her period and continues to be very malodorous. She has asked for nicotine gum prn as well as albuterol prn. A: RN is attempting to establish a therapeutic relationship with this patient before making too many request or redirecting. R: she is becoming more compliant with the staff's request and  the staff, continues to work closely with this patient. RN will monitor and Q 15 min ck's continue.

## 2014-02-09 NOTE — Progress Notes (Signed)
Patient ID: Molly [name], female   DOB: 09/24/1985, 28 y.o.   MRN: [id number] Psychoeducational Group Note  Date:  02/09/2014 Time:0930am  Group Topic/Focus:  Identifying Needs:   The focus of this group is to help patients identify their personal needs that have been historically problematic and identify healthy behaviors to address their needs.  Participation Level:  Did Not Attend  Participation Quality:    Affect:  Cognitive:  Insight:  Engagement in Group: Additional Comments:  Did not attend inventory group   Valente DavidWeaver, [name] 02/09/2014,9:58 AM

## 2014-02-09 NOTE — Progress Notes (Signed)
[facility] MD Progress Note  02/09/2014 12:05 PM Molly [name]  MRN:  [id number] Subjective:  Patient states " I want a different doctor".  Objective:Patient seen and chart reviewed. Patient refuses to communicate and asks for a different provider.  I have discussed case with treatment team/nursing staff. Patient has made some progress but remains disorganized and intermittently irritable and angry.Patient is very disheveled and has all her clothes thrown all over the floor and her stained sanitary pad is on the floor with her clothes. Patient has foul odor and is very disorganized and suspicious.   Abscess on back has been  Identified by nursing staff and is draining. Patient has no fever , no chills, and does not appear toxic. Wound Nursing consult has been requested. No medication side effects endorsed. Limited participation in milieu  Diagnosis:   Primary Psychiatric Diagnosis: Schizophrenia ,multiple episodes, currently in acute episode    Non Psychiatric Diagnosis: Wound on her back(buttocks)      Total Time spent with patient: 25 minutes     ADL's: fair   Sleep: improved   Appetite:  fair  Psychiatric Specialty Exam: Physical Exam  Review of Systems  Constitutional: Negative for fever and chills.  Respiratory: Negative for cough.   Cardiovascular: Negative for chest pain.  Gastrointestinal: Negative for vomiting and abdominal pain.  Skin: Negative.  Negative for rash.       Abscess on lower back area, draining- as reported by nursing.  Psychiatric/Behavioral: Positive for depression.       (+) psychosis and thought disorder    Blood pressure 110/79, pulse 96, temperature 98.8 F (37.1 C), temperature source Oral, resp. rate 16.There is no weight on file to calculate BMI.  General Appearance: Disheveled  Eye Contact::  Minimal  Speech:  Slow  Volume:  variable   Mood:  Irritable  Affect:  blunted, restricted, intermittently irritable  Thought Process:   Disorganized  Orientation:  Other:  difficult to assess due to limited cooperation from patient. Does not appear delirious or grossly confused/disoriented  Thought Content:  Delusions and Paranoid Ideation  Suicidal Thoughts:  No- denies any current suicidal or homicidal plans/intent  Homicidal Thoughts:  No  Memory:  difficult to assess- grossly intact   Judgement:  Impaired  Insight:  Lacking  Psychomotor Activity:  Normal  Concentration:  Fair  Recall:  Good  Fund of Knowledge:Good  Language: Fair  Akathisia:  No  Handed:  Right  AIMS (if indicated):     Assets:  Desire for Improvement Resilience  Sleep:  Number of Hours: 6.5   Musculoskeletal: Strength & Muscle Tone: within normal limits Gait & Station: normal Patient leans: N/A  Current Medications: Current Facility-Administered Medications  Medication Dose Route Frequency Provider Last Rate Last Dose  . acetaminophen (TYLENOL) tablet 650 mg  650 mg Oral Q6H PRN Kerry Hough, PA-C      . alum & mag hydroxide-simeth (MAALOX/MYLANTA) 200-200-20 MG/5ML suspension 30 mL  30 mL Oral Q4H PRN Kerry Hough, PA-C      . divalproex (DEPAKOTE ER) 24 hr tablet 1,000 mg  1,000 mg Oral BID PC Shuvon Rankin, NP   1,000 mg at 02/09/14 0833  . hydrOXYzine (ATARAX/VISTARIL) tablet 50 mg  50 mg Oral QHS Shuvon Rankin, NP   50 mg at 02/08/14 2123  . magnesium hydroxide (MILK OF MAGNESIA) suspension 30 mL  30 mL Oral Daily PRN Shuvon Rankin, NP      . nicotine polacrilex (NICORETTE) gum 2 mg  2 mg Oral PRN Jomarie Longs, MD   2 mg at 02/09/14 0837  . nitrofurantoin (macrocrystal-monohydrate) (MACROBID) capsule 100 mg  100 mg Oral BID Shuvon Rankin, NP   100 mg at 02/09/14 0833  . OLANZapine zydis (ZYPREXA) disintegrating tablet 15 mg  15 mg Oral QHS Nehemiah Massed, MD   15 mg at 02/08/14 2122  . sulfamethoxazole-trimethoprim (BACTRIM DS) 800-160 MG per tablet 1 tablet  1 tablet Oral Q12H Jomarie Longs, MD      . traZODone (DESYREL)  tablet 100 mg  100 mg Oral QHS,MR X 1 Kerry Hough, PA-C   100 mg at 02/08/14 2123    Lab Results:  Results for orders placed during the hospital encounter of 02/06/14 (from the past 48 hour(s))  GLUCOSE, CAPILLARY     Status: Abnormal   Collection Time    02/08/14  6:27 AM      Result Value Ref Range   Glucose-Capillary 104 (*) 70 - 99 mg/dL  HEMOGLOBIN J8J     Status: Abnormal   Collection Time    02/08/14  7:36 PM      Result Value Ref Range   Hemoglobin A1C 6.2 (*) <5.7 %   Comment: (NOTE)                                                                               According to the ADA Clinical Practice Recommendations for 2011, when     HbA1c is used as a screening test:      >=6.5%   Diagnostic of Diabetes Mellitus               (if abnormal result is confirmed)     5.7-6.4%   Increased risk of developing Diabetes Mellitus     References:Diagnosis and Classification of Diabetes Mellitus,Diabetes     Care,2011,34(Suppl 1):S62-S69 and Standards of Medical Care in             Diabetes - 2011,Diabetes Care,2011,34 (Suppl 1):S11-S61.   Mean Plasma Glucose 131 (*) <117 mg/dL   Comment: Performed at [facility]  LIPID PANEL     Status: Abnormal   Collection Time    02/08/14  7:36 PM      Result Value Ref Range   Cholesterol 107  0 - 200 mg/dL   Triglycerides 77  <191 mg/dL   HDL 39 (*) >47 mg/dL   Total CHOL/HDL Ratio 2.7     VLDL 15  0 - 40 mg/dL   LDL Cholesterol 53  0 - 99 mg/dL   Comment:            Total Cholesterol/HDL:CHD Risk     Coronary Heart Disease Risk Table                         Men   Women      1/2 Average Risk   3.4   3.3      Average Risk       5.0   4.4      2 X Average Risk   9.6   7.1      3 X Average Risk  23.4  11.0                Use the calculated Patient Ratio     above and the CHD Risk Table     to determine the patient's CHD Risk.                ATP III CLASSIFICATION (LDL):      <100     mg/dL   Optimal      [id number]  mg/dL    Near or Above                        Optimal      130-159  mg/dL   Borderline      [id number]  mg/dL   High      >811>190     mg/dL   Very High     [facility]  PROLACTIN     Status: None   Collection Time    02/08/14  7:36 PM      Result Value Ref Range   Prolactin 43.4     Comment: (NOTE)         Reference Ranges:                     Female:                       2.1 -  17.1 ng/ml                     Female:   Pregnant          9.7 - 208.5 ng/mL                               Non Pregnant      2.8 -  29.2 ng/mL                               Post Menopausal   1.8 -  20.3 ng/mL                           Performed at [facility]    Physical Findings: AIMS: Facial and Oral Movements Muscles of Facial Expression: None, normal Lips and Perioral Area: None, normal Jaw: None, normal Tongue: None, normal,Extremity Movements Upper (arms, wrists, hands, fingers): None, normal Lower (legs, knees, ankles, toes): None, normal, Trunk Movements Neck, shoulders, hips: None, normal, Overall Severity Severity of abnormal movements (highest score from questions above): None, normal Incapacitation due to abnormal movements: None, normal Patient's awareness of abnormal movements (rate only patient's report): No Awareness,    CIWA:  CIWA-Ar Total: 2 COWS:     Assessment-  Patient remains disorganized , guarded, intermittently irritable and angry. She is only superficially cooperative, but does present with some improvement, compared to admission, to include improved eye contact. No side effects from medications thus far.   Treatment Plan Summary: Daily contact with patient to assess and evaluate symptoms and progress in treatment Medication management See below   Plan: Continue inpatient treatment. Continue Depakote 1000 mg po bid for mood lability. Will get depakote level on 02/12/14. Continue Zyprexa Zydis 15 mgrs QHS. Will start Bactrim po Q12 H  for wound on her  back. Wound consult placed. Will  continue Macrobid for UTI.   Medical Decision Making Problem Points:  Established problem, stable/improving (1), Review of last therapy session (1) and Review of psycho-social stressors (1) Data Points:  Review or order clinical lab tests (1) Review of medication regiment & side effects (2) Review of new medications or change in dosage (2)  I certify that inpatient services furnished can reasonably be expected to improve the patient's condition.   Ostin Mathey MD 02/09/2014, 12:05 PM

## 2014-02-09 NOTE — Progress Notes (Signed)
Pt refused CBG.

## 2014-02-09 NOTE — [facility] Group Notes (Signed)
[facility] Group Notes: (Clinical Social Work)   02/09/2014      Type of Therapy:  Group Therapy   Participation Level:  Did Not Attend - refused   Ambrose MantleMareida Grossman-Orr, [name] 02/09/2014, 12:43 PM

## 2014-02-09 NOTE — Progress Notes (Signed)
The focus of this group is to help patients review their daily goal of treatment and discuss progress on daily workbooks. Pt attended the evening group session and responded to all discussion prompts from the Writer. Pt shared that today was a good day on the unit, the highlight of which was meeting her favorite singer, Madolyn Friezeicki Minaj. When asked about it further, Pt admitted this encounter never happened. Pt told the group that her favorite coping skill at home was singing, dancing and songwriting. Pt's affect was appropriate.

## 2014-02-10 LAB — CBC WITH DIFFERENTIAL/PLATELET
BASOS ABS: 0 10*3/uL (ref 0.0–0.1)
Basophils Relative: 0 % (ref 0–1)
EOS ABS: 0.2 10*3/uL (ref 0.0–0.7)
EOS PCT: 2 % (ref 0–5)
HCT: 32.8 % — ABNORMAL LOW (ref 36.0–46.0)
Hemoglobin: 10.9 g/dL — ABNORMAL LOW (ref 12.0–15.0)
Lymphocytes Relative: 26 % (ref 12–46)
Lymphs Abs: 2.1 10*3/uL (ref 0.7–4.0)
MCH: 28.8 pg (ref 26.0–34.0)
MCHC: 33.2 g/dL (ref 30.0–36.0)
MCV: 86.8 fL (ref 78.0–100.0)
Monocytes Absolute: 0.8 10*3/uL (ref 0.1–1.0)
Monocytes Relative: 10 % (ref 3–12)
Neutro Abs: 5 10*3/uL (ref 1.7–7.7)
Neutrophils Relative %: 62 % (ref 43–77)
PLATELETS: 372 10*3/uL (ref 150–400)
RBC: 3.78 MIL/uL — ABNORMAL LOW (ref 3.87–5.11)
RDW: 14.5 % (ref 11.5–15.5)
WBC: 8.1 10*3/uL (ref 4.0–10.5)

## 2014-02-10 LAB — GLUCOSE, CAPILLARY: GLUCOSE-CAPILLARY: 105 mg/dL — AB (ref 70–99)

## 2014-02-10 LAB — VALPROIC ACID LEVEL: Valproic Acid Lvl: 82.4 ug/mL (ref 50.0–100.0)

## 2014-02-10 MED ORDER — ZIPRASIDONE MESYLATE 20 MG IM SOLR
20.0000 mg | Freq: Two times a day (BID) | INTRAMUSCULAR | Status: DC | PRN
  Administered 2014-02-10: 20 mg via INTRAMUSCULAR
  Filled 2014-02-10 (×2): qty 20

## 2014-02-10 MED ORDER — DIPHENHYDRAMINE HCL 50 MG/ML IJ SOLN
50.0000 mg | Freq: Once | INTRAMUSCULAR | Status: AC | PRN
  Administered 2014-02-10: 50 mg via INTRAMUSCULAR
  Filled 2014-02-10: qty 1

## 2014-02-10 MED ORDER — ZIPRASIDONE HCL 20 MG PO CAPS
ORAL_CAPSULE | ORAL | Status: AC
  Administered 2014-02-10: 20 mg
  Filled 2014-02-10: qty 1

## 2014-02-10 MED ORDER — LORAZEPAM 2 MG/ML IJ SOLN
INTRAMUSCULAR | Status: AC
  Administered 2014-02-10: 2 mg via INTRAMUSCULAR
  Filled 2014-02-10: qty 1

## 2014-02-10 MED ORDER — VALPROIC ACID 250 MG/5ML PO SYRP
1000.0000 mg | ORAL_SOLUTION | Freq: Two times a day (BID) | ORAL | Status: DC
  Administered 2014-02-10 – 2014-02-17 (×15): 1000 mg via ORAL
  Filled 2014-02-10 (×20): qty 20

## 2014-02-10 MED ORDER — OLANZAPINE 10 MG PO TBDP
20.0000 mg | ORAL_TABLET | Freq: Every day | ORAL | Status: DC
  Administered 2014-02-10 – 2014-02-11 (×2): 20 mg via ORAL
  Filled 2014-02-10 (×3): qty 2

## 2014-02-10 MED ORDER — ZIPRASIDONE MESYLATE 20 MG IM SOLR: 20.0000 mg | Freq: Once | INTRAMUSCULAR | Status: DC | PRN

## 2014-02-10 MED ORDER — ZIPRASIDONE HCL 40 MG PO CAPS
40.0000 mg | ORAL_CAPSULE | Freq: Two times a day (BID) | ORAL | Status: DC | PRN
  Administered 2014-02-11 – 2014-02-12 (×2): 40 mg via ORAL
  Filled 2014-02-10 (×2): qty 2

## 2014-02-10 MED ORDER — ZIPRASIDONE MESYLATE 20 MG IM SOLR
20.0000 mg | Freq: Once | INTRAMUSCULAR | Status: DC
  Filled 2014-02-10: qty 20

## 2014-02-10 MED ORDER — ZIPRASIDONE HCL 20 MG PO CAPS: 20.0000 mg | ORAL_CAPSULE | Freq: Once | ORAL | Status: DC | PRN

## 2014-02-10 MED ORDER — DIPHENHYDRAMINE HCL 25 MG PO CAPS: 50.0000 mg | ORAL_CAPSULE | Freq: Once | ORAL | Status: AC | PRN

## 2014-02-10 MED ORDER — LORAZEPAM 2 MG/ML IJ SOLN
2.0000 mg | Freq: Once | INTRAMUSCULAR | Status: AC
  Administered 2014-02-10: 2 mg via INTRAMUSCULAR

## 2014-02-10 MED ORDER — LORAZEPAM 2 MG/ML IJ SOLN
2.0000 mg | Freq: Once | INTRAMUSCULAR | Status: AC | PRN
Start: 2014-02-10 — End: 2014-02-10
  Administered 2014-02-10: 2 mg via INTRAMUSCULAR
  Filled 2014-02-10: qty 1

## 2014-02-10 MED ORDER — DIPHENHYDRAMINE HCL 50 MG/ML IJ SOLN
50.0000 mg | Freq: Once | INTRAMUSCULAR | Status: AC
  Administered 2014-02-10: 50 mg via INTRAMUSCULAR
  Filled 2014-02-10: qty 1

## 2014-02-10 MED ORDER — LORAZEPAM 1 MG PO TABS: 2.0000 mg | ORAL_TABLET | Freq: Once | ORAL | Status: AC | PRN

## 2014-02-10 MED ORDER — DIPHENHYDRAMINE HCL 50 MG/ML IJ SOLN
INTRAMUSCULAR | Status: AC
  Administered 2014-02-10: 50 mg via INTRAMUSCULAR
  Filled 2014-02-10: qty 1

## 2014-02-10 NOTE — Progress Notes (Signed)
Patient refused to have her labs drawn this morning.

## 2014-02-10 NOTE — Progress Notes (Addendum)
Patient ID: Molly FreshJessica S [name], female   DOB: 09-May-1985, 28 y.o.   MRN: [id number] D. Patient continues to be intrusive, demanding, sexually inappropriate and taking clothes out of other peers room. She has become fixated on peerS flirting and attempting to touch another peer despite being redirected multiple times. Pt remains labile, at times banging on walls and threatened staff. mutliple doses of prn medications given (see eMAR). A. Notified Dr. Elna BreslowEappen of above information and orders received for 1.1 monitoring. R. 1.1. Placed for safety at 1900. Will continue to monitor as ordered.

## 2014-02-10 NOTE — Progress Notes (Signed)
D: Molly [name]'s mood has been somewhat pleasant tonight. She did become concerned that her peers were talking about her and said she had a body odor.  A: Support given. Verbalization encouraged. This nurse offered to assist patient in getting in the tub for a bath. Molly [name] took a Designer, multimediabath tonight after multiple days of poor hygiene. Encouraged patient to take a bath daily. Pt encouraged to come to staff with any concerns. R: Pt is receptive. No complaints of pain or discomfort at this time. Q15 min safety checks maintained. Will continue to monitor.

## 2014-02-10 NOTE — Progress Notes (Addendum)
[facility] MD Progress Note  02/10/2014 12:18 PM Molly [name]  MRN:  [id number] Subjective:  Patient states " I hate your face .' Objective:Patient seen and chart reviewed. Patient today appears to be well groomed ,has make up on ,with pink lipstick on.This is quiet different from yesterday when she appeared disheveled with clothes all over the floor and had her used sanitary pads all over the floor. Patient today appears to be irritable but is talking. Patient however continues to be disorganized and delusional.    I have discussed case with treatment team/nursing staff. Patient has made some progress but remains disorganized and intermittently irritable and angry.Patient per report has been accusing staff of having sex with her girlfriend. Patient also was cheeking her medications. Hence her Depakote was changed to depakene.  Abscess on back has been  Identified by nursing staff and is draining. Patient has no fever , no chills, and does not appear toxic. Wound Nursing consult has been requested. But patient was not seen. Will try again. No medication side effects endorsed. Limited participation in milieu  Diagnosis:   Primary Psychiatric Diagnosis: Schizophrenia ,multiple episodes, currently in acute episode versus Schizoaffective disorder,bipolar type    Non Psychiatric Diagnosis: Wound on her back(buttocks)      Total Time spent with patient: 25 minutes     ADL's: fair   Sleep: improved   Appetite:  fair  Psychiatric Specialty Exam: Physical Exam  Skin:  Patient per nursing staff has a large purulent abcess vs boil ,which is draining ,on her buttocks. Patient is not cooperative with exam    Review of Systems  Constitutional: Negative for fever and chills.  Respiratory: Negative for cough.   Cardiovascular: Negative for chest pain.  Gastrointestinal: Negative for vomiting and abdominal pain.  Skin: Negative.  Negative for rash.       Abscess on lower back area,  draining- as reported by nursing.  Psychiatric/Behavioral: Positive for depression.       (+) psychosis and thought disorder    Blood pressure 110/79, pulse 96, temperature 98.8 F (37.1 C), temperature source Oral, resp. rate 16.There is no weight on file to calculate BMI.  General Appearance: Disheveled  Eye Solicitor::  Fair  Speech:  Clear and Coherent  Volume:  variable   Mood:  Irritable  Affect:  Labile  Thought Process:  Disorganized  Orientation:  Other:  difficult to assess due to limited cooperation from patient. Does not appear delirious or grossly confused/disoriented  Thought Content:  Delusions and Paranoid Ideation  Suicidal Thoughts:  No- denies any current suicidal or homicidal plans/intent  Homicidal Thoughts:  No  Memory:  difficult to assess- grossly intact   Judgement:  Impaired  Insight:  Lacking  Psychomotor Activity:  Normal  Concentration:  Fair  Recall:  Good  Fund of Knowledge:Good  Language: Fair  Akathisia:  No  Handed:  Right  AIMS (if indicated):     Assets:  Desire for Improvement Resilience  Sleep:  Number of Hours: 5.25   Musculoskeletal: Strength & Muscle Tone: within normal limits Gait & Station: normal Patient leans: N/A  Current Medications: Current Facility-Administered Medications  Medication Dose Route Frequency Provider Last Rate Last Dose  . acetaminophen (TYLENOL) tablet 650 mg  650 mg Oral Q6H PRN Kerry Hough, PA-C      . albuterol (PROVENTIL HFA;VENTOLIN HFA) 108 (90 BASE) MCG/ACT inhaler 2 puff  2 puff Inhalation Q6H PRN Verne Spurr, PA-C   2 puff at 02/09/14 2218  .  alum & mag hydroxide-simeth (MAALOX/MYLANTA) 200-200-20 MG/5ML suspension 30 mL  30 mL Oral Q4H PRN Kerry Hough, PA-C      . hydrOXYzine (ATARAX/VISTARIL) tablet 50 mg  50 mg Oral QHS Shuvon Rankin, NP   50 mg at 02/09/14 2121  . ibuprofen (ADVIL,MOTRIN) tablet 600 mg  600 mg Oral Q6H PRN Jomarie Longs, MD   600 mg at 02/09/14 1417  . magnesium  hydroxide (MILK OF MAGNESIA) suspension 30 mL  30 mL Oral Daily PRN Shuvon Rankin, NP      . nicotine polacrilex (NICORETTE) gum 2 mg  2 mg Oral PRN Jomarie Longs, MD   2 mg at 02/09/14 1942  . nitrofurantoin (macrocrystal-monohydrate) (MACROBID) capsule 100 mg  100 mg Oral BID Shuvon Rankin, NP   100 mg at 02/10/14 0733  . OLANZapine zydis (ZYPREXA) disintegrating tablet 20 mg  20 mg Oral QHS [place] Festa, MD      . sulfamethoxazole-trimethoprim (BACTRIM DS) 800-160 MG per tablet 1 tablet  1 tablet Oral Q12H Jomarie Longs, MD   1 tablet at 02/10/14 0732  . traZODone (DESYREL) tablet 100 mg  100 mg Oral QHS,MR X 1 Kerry Hough, PA-C   100 mg at 02/09/14 2121  . Valproic Acid (DEPAKENE) 250 MG/5ML syrup SYRP 1,000 mg  1,000 mg Oral BID Jomarie Longs, MD   1,000 mg at 02/10/14 1215  . ziprasidone (GEODON) capsule 40 mg  40 mg Oral Q12H PRN Jomarie Longs, MD       Or  . ziprasidone (GEODON) injection 20 mg  20 mg Intramuscular Q12H PRN Jomarie Longs, MD        Lab Results:  Results for orders placed during the hospital encounter of 02/06/14 (from the past 48 hour(s))  HEMOGLOBIN A1C     Status: Abnormal   Collection Time    02/08/14  7:36 PM      Result Value Ref Range   Hemoglobin A1C 6.2 (*) <5.7 %   Comment: (NOTE)                                                                               According to the ADA Clinical Practice Recommendations for 2011, when     HbA1c is used as a screening test:      >=6.5%   Diagnostic of Diabetes Mellitus               (if abnormal result is confirmed)     5.7-6.4%   Increased risk of developing Diabetes Mellitus     References:Diagnosis and Classification of Diabetes Mellitus,Diabetes     Care,2011,34(Suppl 1):S62-S69 and Standards of Medical Care in             Diabetes - 2011,Diabetes Care,2011,34 (Suppl 1):S11-S61.   Mean Plasma Glucose 131 (*) <117 mg/dL   Comment: Performed at [facility]  LIPID PANEL     Status: Abnormal    Collection Time    02/08/14  7:36 PM      Result Value Ref Range   Cholesterol 107  0 - 200 mg/dL   Triglycerides 77  <960 mg/dL   HDL 39 (*) >45 mg/dL   Total CHOL/HDL Ratio  2.7     VLDL 15  0 - 40 mg/dL   LDL Cholesterol 53  0 - 99 mg/dL   Comment:            Total Cholesterol/HDL:CHD Risk     Coronary Heart Disease Risk Table                         Men   Women      1/2 Average Risk   3.4   3.3      Average Risk       5.0   4.4      2 X Average Risk   9.6   7.1      3 X Average Risk  23.4   11.0                Use the calculated Patient Ratio     above and the CHD Risk Table     to determine the patient's CHD Risk.                ATP III CLASSIFICATION (LDL):      <100     mg/dL   Optimal      [id number]  mg/dL   Near or Above                        Optimal      130-159  mg/dL   Borderline      119-147160-189  mg/dL   High      >829>190     mg/dL   Very High     Performed at [facility]  PROLACTIN     Status: None   Collection Time    02/08/14  7:36 PM      Result Value Ref Range   Prolactin 43.4     Comment: (NOTE)         Reference Ranges:                     Female:                       2.1 -  17.1 ng/ml                     Female:   Pregnant          9.7 - 208.5 ng/mL                               Non Pregnant      2.8 -  29.2 ng/mL                               Post Menopausal   1.8 -  20.3 ng/mL                           Performed at [facility]  URINALYSIS W MICROSCOPIC     Status: Abnormal   Collection Time    02/09/14  1:15 PM      Result Value Ref Range   Color, Urine RED (*) YELLOW   Comment: BIOCHEMICALS MAY BE AFFECTED BY COLOR   APPearance TURBID (*) CLEAR   Specific Gravity, Urine 1.018  1.005 - 1.030  pH 7.0  5.0 - 8.0   Glucose, UA NEGATIVE  NEGATIVE mg/dL   Hgb urine dipstick LARGE (*) NEGATIVE   Bilirubin Urine LARGE (*) NEGATIVE   Ketones, ur 40 (*) NEGATIVE mg/dL   Protein, ur 782100 (*) NEGATIVE mg/dL   Urobilinogen, UA 1.0  0.0 -  1.0 mg/dL   Nitrite POSITIVE (*) NEGATIVE   Leukocytes, UA MODERATE (*) NEGATIVE   RBC / HPF TOO NUMEROUS TO COUNT  <3 RBC/hpf   Bacteria, UA RARE  RARE   Squamous Epithelial / LPF RARE  RARE   Urine-Other FIELD OBSCURED BY RBC'S     Comment: Performed at [facility]  GLUCOSE, CAPILLARY     Status: Abnormal   Collection Time    02/10/14  6:47 AM      Result Value Ref Range   Glucose-Capillary 105 (*) 70 - 99 mg/dL    Physical Findings: AIMS: Facial and Oral Movements Muscles of Facial Expression: None, normal Lips and Perioral Area: None, normal Jaw: None, normal Tongue: None, normal,Extremity Movements Upper (arms, wrists, hands, fingers): None, normal Lower (legs, knees, ankles, toes): None, normal, Trunk Movements Neck, shoulders, hips: None, normal, Overall Severity Severity of abnormal movements (highest score from questions above): None, normal Incapacitation due to abnormal movements: None, normal Patient's awareness of abnormal movements (rate only patient's report): No Awareness,    CIWA:  CIWA-Ar Total: 2 COWS:     Assessment-  Patient remains disorganized , guarded, intermittently irritable and angry. She is only superficially cooperative, but does present with some improvement, compared to admission, to include improved eye contact. No side effects from medications thus far.   Treatment Plan Summary: Daily contact with patient to assess and evaluate symptoms and progress in treatment Medication management See below   Plan: Continue inpatient treatment. Will change Depakote 1000 mg po bid to Depakene for more compliance .Will get depakote level on 02/12/14. Will increase Zyprexa Zydis to 20 mg QHS. Will make available prns for agitation. Will start Bactrim po Q12 H  for wound on her back. Wound consult placed. Will continue Macrobid for UTI.   Medical Decision Making Problem Points:  Established problem, stable/improving (1), Review of  last therapy session (1) and Review of psycho-social stressors (1) Data Points:  Review or order clinical lab tests (1) Review of medication regiment & side effects (2) Review of new medications or change in dosage (2)  I certify that inpatient services furnished can reasonably be expected to improve the patient's condition.   Loda Bialas MD 02/10/2014, 12:18 PM

## 2014-02-10 NOTE — Progress Notes (Signed)
Patient ID: [name], female   DOB: 08/01/1985, 28 y.o.   MRN: [id number] Nursing shift note: D: At about 0732 pt came to the nurse and stated she vomited her medications and had her Depakote in a napkin. RN thinks that this pt "cheeked" her medications. Earlier in the shift this pt had been anxious/agitated and was acting out on the unit. A: RN obtained an order for IM/po geodon 20 mg, ativan 20 mg and benadryl 50 mg. She took the ativan and the benadryl both by injection and the geodon po. R: as the shift progressed she became more redirectable and compliant. She refused to fill out her inventory sheet and refused her v/s this am. RN will monitor and Q 15 min ck's continue.

## 2014-02-10 NOTE — [facility] Group Notes (Signed)
[facility] Group Notes:  (Clinical Social Work)  02/10/2014   11:15am-12:00pm  Summary of Progress/Problems:  The main focus of today's process group was to listen to a variety of genres of music and to identify that different types of music provoke different responses.  The patient then was able to identify personally what was soothing for them, as well as energizing.  Handouts were used to record feelings evoked, as well as how patient can personally use this knowledge in sleep habits, with depression, and with other symptoms.  The patient expressed understanding of concepts, as well as knowledge of how each type of music affected him/her and how this can be used at home as a wellness/recovery tool.  Initially she was non-responsive, but eventually she started singing the songs, and was involved more in the group.  Type of Therapy:  Music Therapy   Participation Level:  Active  Participation Quality:  Attentive and Sharing  Affect:  Flat  Cognitive:  Disorganized  Insight:  Engaged  Engagement in Therapy:  Engaged  Modes of Intervention:   Activity, Exploration  Ambrose MantleMareida Grossman-Orr, [name] 02/10/2014, 12:30pm

## 2014-02-10 NOTE — Progress Notes (Addendum)
D: Patient in the hallway on approach.  Patient has been sexually inappropriate and intrusive.  Patient remains delusional and disorganized.  Per MHT on the hall patient took clothing from another patient.  Patient also going up to peers asking for their clothing and their jewelry. Patient also tried to talk  Patient is on 1:1 for her behaviors.  Patient is easily redirected but has to be redirected often.  Patient denies SI/HI and denies AVH. A: Staff to monitor Q 15 mins for safety.  Encouragement and support offered.  Scheduled medications administered per orders.   R: Patient remains safe on the unit.  Patient did not attend group tonight.  Patient taking administered medications.  Patient visible on the unit.

## 2014-02-10 NOTE — [facility] Group Notes (Signed)
[facility] Group Notes:  (Nursing/MHT/Case Management/Adjunct)  Date:  02/10/2014  Time:  09  Type of Therapy:  Nurse Education  Participation Level:  Did Not Attend  Participation Quality:    Affect:    Cognitive:    Insight:   Engagement in Group:    Modes of Intervention:    Summary of Progress/Problems:  Layla BarterWhite, Lossie Kalp L 02/10/2014, 1:41 PM

## 2014-02-10 NOTE — Progress Notes (Signed)
D: Patient resting in bed with eyes closed.  Respirations even and unlabored.  Patient appears to be in no apparent distress. A: Staff to monitor Q 15 mins for safety.  Patient remains on 1:1 for intrusive and sexually inappropriate behavior R:Patient remains safe on the unit.

## 2014-02-10 NOTE — Progress Notes (Signed)
Patient ID: Molly [name], female   DOB: October 11, 1985, 28 y.o.   MRN: [id number]  Patient has been non compliant with her treatment program, refusing to take her medication, due to increased symptoms of mood swings, irrational thoughts, grandiose, poor insight, judgment, impulse control and making inappropriate phone calls to emergency service from in patient unit. She needs forced medication as per Dr. Abelina BachelorEppen and I endorse the need for forced medication as a second opinion and agree with the plan.   Samson Ralph,JANARDHAHA R. 02/10/2014 1:08 PM

## 2014-02-11 DIAGNOSIS — F209 Schizophrenia, unspecified: Secondary | ICD-10-CM

## 2014-02-11 LAB — GLUCOSE, CAPILLARY: GLUCOSE-CAPILLARY: 109 mg/dL — AB (ref 70–99)

## 2014-02-11 LAB — URINE CULTURE

## 2014-02-11 MED ORDER — LORAZEPAM 1 MG PO TABS
2.0000 mg | ORAL_TABLET | Freq: Four times a day (QID) | ORAL | Status: DC | PRN
  Administered 2014-02-11 – 2014-02-20 (×12): 2 mg via ORAL
  Filled 2014-02-11 (×12): qty 2

## 2014-02-11 NOTE — Progress Notes (Signed)
Pt increasingly hostile and verbally aggressive. Demanding q-tips for her vagina. Cursing staff. Patient not respecting personal space and posturing toward staff. Banging on unit doors. Ordered 1:1 sitter out of her room. Would not allow wound nurse to assess her draining wound on her buttocks. "Don't fucking touch me." Patient's room disheveled with items strewn all over. Patient has poor hygiene as well. Pt medicated with geodon 40mg  po prn at 1215 which she took with a show of support. Redirection attempted but patient continues to disrupt the milieu and is scaring peers and staff. At approximately 1240, pt vomited but MHT was unable to see emesis. MHT reports it appeared patient made herself vomit however she denies. Will speak with MD on rounds about above information. 1:1 continues for intrusive and inappropriate behaviors as patient remains hypersexual toward men and women. Lawrence MarseillesFriedman, Rice Walsh Eakes

## 2014-02-11 NOTE — Progress Notes (Addendum)
Pt remains irritable and at times hostile. Cursing staff. When offered meds patient stated, "I don't need no damn antibiotic. I don't have a bladder infection." Patient did end up taking antibiotics and was compliant with depakote syrup. Threw her pill cup at this writer. Patient asked, "where's my risperdal?" Explained to patient it is not ordered at this time and patient responded, "well, that's fine. I have a prescription for it so I will just take it when I leave." Med education attempted but patient refused. Patient asking for pap smear though denies symptoms. Patient also has clothes she took from peer on the hall and is refusing to return them. Patient remains on 1:1 for inappropriate and impulsive behaviors. She denies SI/HI/AVH and remains safe. Lawrence MarseillesFriedman, Llana Deshazo Eakes

## 2014-02-11 NOTE — Progress Notes (Signed)
Patient continues to be labile and verbally aggressive. Threatening female peer on hall. Staff intervened and separated the two. Patient paranoid and delusional. Accusing staff of stealing her jewelry. Tells this Clinical research associatewriter, "you need to give me Rossevelt's earrings. You're wearing them. I see them." Patient did shower earlier this afternoon though is still disheveled and is observed pulling pants down and lifting shirt up in hallway. 1:1 continues for impulsive and inappropriate behavior. Redirection frequently given however with little success. Ativan 2mg  po prn given and will assess in 1 hour. Pt currently in hallway with 1:1 MHT and she remains safe. Molly [name], Molly [name]

## 2014-02-11 NOTE — Progress Notes (Signed)
Patient ID: Molly [name], female   DOB: Sep 09, 1985, 28 y.o.   MRN: [id number] PER STATE REGULATIONS 482.30  THIS CHART WAS REVIEWED FOR MEDICAL NECESSITY WITH RESPECT TO THE PATIENT'S ADMISSION/ DURATION OF STAY.  NEXT REVIEW DATE: 02/14/2014  Willa RoughJENNIFER JONES Molly Sayegh, RN, BSN CASE MANAGER

## 2014-02-11 NOTE — Progress Notes (Signed)
D: Patient resting in bed with eyes closed.  Respirations even and unlabored.  Patient appears to be in no apparent distress. A: Staff to monitor Q 15 mins for safety.  Patient remains on 1:1 for intrusive and sexually inappropriate behavior R:Patient remains safe on the unit.  

## 2014-02-11 NOTE — Progress Notes (Addendum)
D: Patient resting in bed with eyes closed.  Respirations even and unlabored.  Patient appears to be in no apparent distress.  Per sitter patient has been adjusting self in sleep but patient did not wake up when writer went into the room. A: Staff to monitor Q 15 mins for safety. Patient remains on 1:1 for intrusive and sexually inappropriate behavior.  R:Patient remains safe on the unit.

## 2014-02-11 NOTE — Progress Notes (Addendum)
Patient 1:1 Note  D: Patient in the hallway speaking with Clinical research associatewriter.  Patient delusional and disorganized.  Patient calm when talking to writer but still remains sexually inappropriate.  Patient states her goal is to ne discharged and find a place to live.  Patient's room in disarray.  Patient has used Public affairs consultantsanitary napkins on the floor and worn underwear. Patient denies SI/HI and denies AVH. A: Staff to monitor Q 15 mins for safety.  Encouragement and support offered.  Scheduled medications administered per orders. R: Patient remains safe on the unit.  Patient attended group tonight.  Patient visible on the unit and interacting with peers.  Patient taking administered medications.

## 2014-02-11 NOTE — [facility] Group Notes (Signed)
[facility] [name] Aftercare Discharge Planning Group Note   10/12[facility]/2015 10:21 AM  Participation Quality:  Minimal  Mood/Affect:  Flat  Depression Rating:  unk  Anxiety Rating:  unk  Thoughts of Suicide:  No Will you contract for safety?   NA  Current AVH:  denies  Plan for Discharge/Comments:  Molly [name] wandered into group with about 8 minutes remaining.  She was dressed in a robe.  When I brought up [name], she responded that she knows him and is interested in calling him together with me later today, [He runs a half way house that she stayed at briefly in the past month]  Appears to be less disorganized and more goal directed today.  Asked several times about getting a PAP test.  Told her we could arrange that on an outpt basis.  Transportation Means: unk  Supports: unk  [employer] Corporationorth, BurtonRodney B

## 2014-02-11 NOTE — Plan of Care (Signed)
Problem: Ineffective individual coping Goal: STG: Patient will remain free from self harm Outcome: Progressing Patient has not engaged in self harm and denies SI at present.  Problem: Alteration in thought process Goal: STG-Patient is able to discuss thoughts with staff Outcome: Not Progressing Patient is argumentative and resistant to care. Thoughts remain tangential and difficult to follow.

## 2014-02-11 NOTE — Progress Notes (Signed)
NUTRITION ASSESSMENT  Consult for education  INTERVENTION: Patient refused education Discussed need for eating healthfully Expect poor compliance Please re consult if appropriate  NUTRITION DIAGNOSIS: Unintentional weight loss related to sub-optimal intake as evidenced by pt report.   Goal: Pt to meet >/= 90% of their estimated nutrition needs.  Monitor:  PO intake  Assessment:  Patient admitted with schizophrenia.  HgbA1C of 6.2.  HDL 39.  Draining wound on buttock noted.  Patient reported that her stomach is upset and vomited lunch.  Tolerating gingerale and crackers currently.  Patient states that at home she is hungry but there is not enough to eat.  States that she gets food stamps but everyone wants food.  "I can't feed everyone."  "I just want to be left alone."  "Why won't they let me have cake."  Noted that weight trend overall has been increasing with a 24 lb weight gain in the past 5 years.  Patient currently inappropriate for diet education.    28 y.o. female  Height: Ht Readings from Last 1 Encounters:  01/24/13 5\' 7"  (1.702 m)    Weight: Wt Readings from Last 1 Encounters:  01/24/13 286 lb 3.2 oz (129.819 kg)    Weight Hx: Wt Readings from Last 10 Encounters:  01/24/13 286 lb 3.2 oz (129.819 kg)  12/25/12 286 lb (129.729 kg)  09/21/12 276 lb (125.193 kg)  08/31/12 278 lb (126.1 kg)  04/24/12 277 lb (125.646 kg)  02/24/12 267 lb 11.2 oz (121.428 kg)  01/31/12 273 lb (123.832 kg)  06/06/11 232 lb (105.235 kg)  05/12/09 245 lb 11.2 oz (111.449 kg)  02/10/09 262 lb 12.8 oz (119.205 kg)    BMI:  45 Pt meets criteria for extreme obesity based on current BMI.  Estimated Nutritional Needs: Kcal: 25-30 kcal/kg Protein: > 1 gram protein/kg Fluid: 1 ml/kcal  Diet Order: General Pt is also offered choice of unit snacks mid-morning and mid-afternoon.  Pt is eating as desired.   Lab results and medications reviewed.   Oran ReinLaura Jobe, RD, LDN Clinical  Inpatient Dietitian Pager:  [phone] Weekend and after hours pager:  [phone]

## 2014-02-11 NOTE — Progress Notes (Signed)
Patient ID: Molly [name], female   DOB: 1986/03/16, 28 y.o.   MRN: [id number] [facility] MD Progress Note  02/11/2014 5:42 PM COSETTA QU  MRN:  [id number]  Subjective:   Patient states she feels " the same" Objective:  Patient presents superficially cooperative, but with a blunted affect, and at times suddenly turning her head to the back of the room as if she is having hallucinations. However, she denies any hallucinations. She does appear paranoid, and ruminates about someone stealing her jewelry and reportedly was accusing staff of stealing from her. She remains disorganized and as per nursing notes, continues to be intrusive and disruptive at times, due to which she continues to be on 1: 1 observation level at this time. She denies medication side effects but states she does not like the medications. Patient has a soft tissue abscess on her back, but has refused for nurses to attend to this /  to dress it today. No fever, no chills, and does not appear acutely ill. Visible on unit, but participation /interaction in milieu is limited . Valproic Acid Serum level 82.4 ( within therapeutic)   Diagnosis:   Primary Psychiatric Diagnosis: Schizophrenia ,multiple episodes, currently in acute episode versus Schizoaffective disorder,bipolar type  Total Time spent with patient: 25 minutes   ADL's: fair   Sleep:good   Appetite:  Good   Psychiatric Specialty Exam: Physical Exam  Skin:  Patient per nursing staff has a large purulent abcess vs boil ,which is draining ,on her buttocks. Patient is not cooperative with exam    Review of Systems  Constitutional: Negative for fever and chills.  Respiratory: Negative for cough.   Cardiovascular: Negative for chest pain.  Gastrointestinal: Negative for vomiting and abdominal pain.  Skin: Negative.  Negative for rash.       Abscess on lower back area, draining- as reported by nursing.  Psychiatric/Behavioral: Positive for depression.       (+)  psychosis and thought disorder    Blood pressure 110/79, pulse 96, temperature 98.8 F (37.1 C), temperature source Oral, resp. rate 16.There is no weight on file to calculate BMI.  General Appearance: Fairly Groomed  Patent attorney::  Fair  Speech:  Slow  Volume:  Normal  Mood:  Irritable  Affect:  Blunted, and intermittently irritable   Thought Process:  Disorganized  Orientation: appears attentive and fully alert, does not appear delirious or confused   Thought Content:  Paranoid Ideation- possible hallucinations, although- see above she denies   Suicidal Thoughts:  No- at this time denies any suicidal or homicidal ideations  Homicidal Thoughts:  No  Memory:  Recent and remote grossly intact  Judgement:  Impaired  Insight:  Lacking  Psychomotor Activity:  Normal  Concentration:  Fair  Recall:  Good  Fund of Knowledge:Good  Language: Fair  Akathisia:  No  Handed:  Right  AIMS (if indicated):     Assets:  Desire for Improvement Resilience  Sleep:  Number of Hours: 6.75   Musculoskeletal: Strength & Muscle Tone: within normal limits Gait & Station: normal Patient leans: N/A  Current Medications: Current Facility-Administered Medications  Medication Dose Route Frequency Provider Last Rate Last Dose  . acetaminophen (TYLENOL) tablet 650 mg  650 mg Oral Q6H PRN Kerry Hough, PA-C      . albuterol (PROVENTIL HFA;VENTOLIN HFA) 108 (90 BASE) MCG/ACT inhaler 2 puff  2 puff Inhalation Q6H PRN Verne Spurr, PA-C   2 puff at 02/11/14 1201  . alum &  mag hydroxide-simeth (MAALOX/MYLANTA) 200-200-20 MG/5ML suspension 30 mL  30 mL Oral Q4H PRN Kerry Hough, PA-C      . hydrOXYzine (ATARAX/VISTARIL) tablet 50 mg  50 mg Oral QHS Shuvon Rankin, NP   50 mg at 02/10/14 2002  . ibuprofen (ADVIL,MOTRIN) tablet 600 mg  600 mg Oral Q6H PRN Jomarie Longs, MD   600 mg at 02/09/14 1417  . LORazepam (ATIVAN) tablet 2 mg  2 mg Oral Q6H PRN Nehemiah Massed, MD   2 mg at 02/11/14 1650  . magnesium  hydroxide (MILK OF MAGNESIA) suspension 30 mL  30 mL Oral Daily PRN Shuvon Rankin, NP      . nicotine polacrilex (NICORETTE) gum 2 mg  2 mg Oral PRN Jomarie Longs, MD   2 mg at 02/11/14 1518  . nitrofurantoin (macrocrystal-monohydrate) (MACROBID) capsule 100 mg  100 mg Oral BID Shuvon Rankin, NP   100 mg at 02/11/14 1631  . OLANZapine zydis (ZYPREXA) disintegrating tablet 20 mg  20 mg Oral QHS Jomarie Longs, MD   20 mg at 02/10/14 2001  . sulfamethoxazole-trimethoprim (BACTRIM DS) 800-160 MG per tablet 1 tablet  1 tablet Oral Q12H Jomarie Longs, MD   1 tablet at 02/11/14 0845  . traZODone (DESYREL) tablet 100 mg  100 mg Oral QHS,MR X 1 Kerry Hough, PA-C   100 mg at 02/10/14 2002  . Valproic Acid (DEPAKENE) 250 MG/5ML syrup SYRP 1,000 mg  1,000 mg Oral BID Jomarie Longs, MD   1,000 mg at 02/11/14 1631  . ziprasidone (GEODON) capsule 40 mg  40 mg Oral Q12H PRN Jomarie Longs, MD   40 mg at 02/11/14 1215   Or  . ziprasidone (GEODON) injection 20 mg  20 mg Intramuscular Q12H PRN Jomarie Longs, MD   20 mg at 02/10/14 1322    Lab Results:  Results for orders placed during the hospital encounter of 02/06/14 (from the past 48 hour(s))  GLUCOSE, CAPILLARY     Status: Abnormal   Collection Time    02/10/14  6:47 AM      Result Value Ref Range   Glucose-Capillary 105 (*) 70 - 99 mg/dL  VALPROIC ACID LEVEL     Status: None   Collection Time    02/10/14  7:35 PM      Result Value Ref Range   Valproic Acid Lvl 82.4  50.0 - 100.0 ug/mL   Comment: Performed at [facility]  CBC WITH DIFFERENTIAL     Status: Abnormal   Collection Time    02/10/14  7:35 PM      Result Value Ref Range   WBC 8.1  4.0 - 10.5 K/uL   RBC 3.78 (*) 3.87 - 5.11 MIL/uL   Hemoglobin 10.9 (*) 12.0 - 15.0 g/dL   HCT 54.0 (*) 98.1 - 19.1 %   MCV 86.8  78.0 - 100.0 fL   MCH 28.8  26.0 - 34.0 pg   MCHC 33.2  30.0 - 36.0 g/dL   RDW 47.8  29.5 - 62.1 %   Platelets 372  150 - 400 K/uL   Neutrophils Relative % 62   43 - 77 %   Neutro Abs 5.0  1.7 - 7.7 K/uL   Lymphocytes Relative 26  12 - 46 %   Lymphs Abs 2.1  0.7 - 4.0 K/uL   Monocytes Relative 10  3 - 12 %   Monocytes Absolute 0.8  0.1 - 1.0 K/uL   Eosinophils Relative 2  0 - 5 %  Eosinophils Absolute 0.2  0.0 - 0.7 K/uL   Basophils Relative 0  0 - 1 %   Basophils Absolute 0.0  0.0 - 0.1 K/uL   Comment: Performed at [facility]  GLUCOSE, CAPILLARY     Status: Abnormal   Collection Time    02/11/14  6:54 AM      Result Value Ref Range   Glucose-Capillary 109 (*) 70 - 99 mg/dL    Physical Findings: AIMS: Facial and Oral Movements Muscles of Facial Expression: None, normal Lips and Perioral Area: None, normal Jaw: None, normal Tongue: None, normal,Extremity Movements Upper (arms, wrists, hands, fingers): None, normal Lower (legs, knees, ankles, toes): None, normal, Trunk Movements Neck, shoulders, hips: None, normal, Overall Severity Severity of abnormal movements (highest score from questions above): None, normal Incapacitation due to abnormal movements: None, normal Patient's awareness of abnormal movements (rate only patient's report): No Awareness,    CIWA:  CIWA-Ar Total: 2 COWS:     Assessment-  Some improvement , but overall remains guarded, disorganized, and irritable. On 1:1 due to intrusive, disruptive behaviors on unit. Tolerating medications well- does not endorse side effects at this time.  Treatment Plan Summary: Daily contact with patient to assess and evaluate symptoms and progress in treatment Medication management See below   Plan: Continue inpatient treatment. At this time continue 1:1 observation.  Depakene now at  1000 mgrs BID . Zyprexa Zydis now at 20 mg QHS. Geodon and Ativan PRNS for severe agitation if needed     Medical Decision Making Problem Points:  Established problem, worsening (2), Review of last therapy session (1) and Review of psycho-social stressors (1) Data Points:   Review or order clinical lab tests (1) Review of medication regiment & side effects (2)  I certify that inpatient services furnished can reasonably be expected to improve the patient's condition.   Nehemiah Massed MD 02/11/2014, 5:42 PM

## 2014-02-11 NOTE — Plan of Care (Signed)
Problem: Alteration in thought process Goal: LTG-Patient is able to perceive the environment accurately Outcome: Not Progressing Patient not progressing and stating this is a jail.

## 2014-02-11 NOTE — Consult Note (Addendum)
WOC wound consult note Reason for Consult: Consult requested; Pt is reported to have a draining wound on her buttocks.  She has a history of one in the past, according to the EMR; refer to ER note on 07/18/13.  She required sedation and I&D before; wound might require consult from a surgeon if it is significant. Wound type: Attempted to discuss with patient and assess site.  She became agitated and refused to let the WOC and staff nurse look at the wound.  She stated, "No one is looking at my ass, get away."   Dressing procedure/placement/frequency: Pt could benefit from a wound culture and antibiotics if she would allow this to be performed.  She should shower without a dressing, then have moist gauze packing if she will allow a dressing to be applied at some point by the staff nurses.  WOC team is unable to provide any further input since the consult was refused.   Thank-you,  Molly Mcgeeawn Estie Sproule MSN, RN, CWOCN, [place]CN-AP, CNS [phone]

## 2014-02-11 NOTE — Tx Team (Signed)
  Interdisciplinary Treatment Plan Update   Date Reviewed:  02/11/2014  Time Reviewed:  8:08 AM  Progress in Treatment:   Attending groups: Sporadically Participating in groups: Minimally Taking medication as prescribed: Yes  Tolerating medication: Yes Family/Significant other contact made: Yes  Patient understands diagnosis: Yes  Discussing patient identified problems/goals with staff: Yes  See initial care plan Medical problems stabilized or resolved: Yes Denies suicidal/homicidal ideation: Yes  In tx team Patient has not harmed self or others: Yes  For review of initial/current patient goals, please see plan of care.  Estimated Length of Stay:  4-5 days  Reason for Continuation of Hospitalization: Hallucinations Medication stabilization Other; describe Medication non-compliance  New Problems/Goals identified:  N/A  Discharge Plan or Barriers:   [name] will make some contacts re possible receiving homes.  Pt signed release for [facility] ACT team today  Additional Comments:  Patient states " I hate your face .'  Objective:Patient seen and chart reviewed. Patient today appears to be well groomed ,has make up on ,with pink lipstick on.This is quiet different from yesterday when she appeared disheveled with clothes all over the floor and had her used sanitary pads all over the floor.  Patient today appears to be irritable but is talking. Patient however continues to be disorganized and delusional.  I have discussed case with treatment team/nursing staff. Patient has made some progress but remains disorganized and intermittently irritable and angry.Patient per report has been accusing staff of having sex with her girlfriend. Patient also was cheeking her medications. Hence her Depakote was changed to depakene.   Attendees:  Signature: Ivin BootySarama Eappen, MD 02/11/2014 8:08 AM   Signature: Richelle Itood Jeidy Hoerner, [name] 02/11/2014 8:08 AM  Signature:  02/11/2014 8:08 AM  Signature:   02/11/2014 8:08 AM  Signature: Kathi SimpersSarah Twyman, RN 02/11/2014 8:08 AM  Signature:  02/11/2014 8:08 AM  Signature:   02/11/2014 8:08 AM  Signature:    Signature:    Signature:    Signature:    Signature:    Signature:      Scribe for Treatment Team:   Richelle Itood Kambrea Carrasco, [name]  02/11/2014 8:08 AM

## 2014-02-12 LAB — GLUCOSE, CAPILLARY: Glucose-Capillary: 96 mg/dL (ref 70–99)

## 2014-02-12 MED ORDER — OLANZAPINE 5 MG PO TBDP
25.0000 mg | ORAL_TABLET | Freq: Every day | ORAL | Status: DC
  Administered 2014-02-12: 25 mg via ORAL
  Filled 2014-02-12 (×3): qty 1

## 2014-02-12 NOTE — Progress Notes (Signed)
[facility] Post 1:1 Observation Documentation  For the first (8) hours following discontinuation of 1:1 precautions, a progress note entry by nursing staff should be documented at least every 2 hours, reflecting the patient's behavior, condition, mood, and conversation.  Use the progress notes for additional entries.  Time 1:1 discontinued: 10:16 am  Patient's Behavior: Calm, cooperative  Patient's Condition: Pt. currently sitting in the hallway with no interaction with others  Patient's Conversation: Assertive, reporting that her abdomen no longer hurts, stating "it feels better"; also verbalized that she wants a cigarette.   Harold BarbanByrd, Ronecia E 02/12/2014, 2:49 PM

## 2014-02-12 NOTE — Progress Notes (Signed)
Adult Psychoeducational Group Note  Date:  02/12/2014 Time:  9:29 PM  Group Topic/Focus:  Wrap-Up Group:   The focus of this group is to help patients review their daily goal of treatment and discuss progress on daily workbooks.  Participation Level:  Minimal  Participation Quality:  Resistant  Affect:  Blunted  Cognitive:  Appropriate  Insight: Limited  Engagement in Group:  Limited  Modes of Intervention:  Socialization and Support  Additional Comments:  Patient attended and participated in group tonight. The reports that today it was boring. She did attended her groups and meals. The patient advised that community to her mean sharing and love.  Lita MainsFrancis, April Colter [facility] 02/12/2014, 9:29 PM

## 2014-02-12 NOTE — Progress Notes (Signed)
Patient refused morning labs this morning. 

## 2014-02-12 NOTE — Progress Notes (Signed)
Patient ID: Molly [name], female   DOB: Feb 23, 1986, 28 y.o.   MRN: [id number] [facility] MD Progress Note  02/12/2014 11:54 AM Molly [name]  MRN:  [id number]  Subjective:   Patient states she is feeling " OK" Objective:  Patient is slowly improving- as discussed with staff she is less intrusive, less disruptive and is more easily redirected by staff.  Her grooming is much improved,and although still vaguely irritable and psychotic, she is presenting with improved relatedness, increased cooperation with staff, and less angry outbursts.  She has been taking medications without much insistence from staff and no indication of cheeking at this time.  She denies medication side effects at present and does not endorse excessive sedation or akatisia, and is not presenting with psychomotor restlessness or agitation at this time. She continues to present psychotic, however, and makes bizarre statement that she gave birth to a snake yesterday, and that she was looking for it earlier but could not find it. She does not appear internally preoccupied at this time, but does state she hears voices " sometimes but not now". Does not describe their content. At other times during session makes insightful , non psychotic remarks, for example states " sometimes I feel like I don't have support from friends or family and that I can't deal with everything on my own". Responds more to support , encouragement , empathy, and seems less guarded and suspicious than she did recently.  Diagnosis:   Primary Psychiatric Diagnosis: Schizophrenia ,multiple episodes, currently in acute episode versus Schizoaffective disorder,bipolar type  Total Time spent with patient: 25 minutes   ADL's: fair   Sleep:fair  Appetite:  Good   Psychiatric Specialty Exam: Physical Exam  Skin:  Patient per nursing staff has a large purulent abcess vs boil ,which is draining ,on her buttocks. Patient is not cooperative with exam    Review of  Systems  Constitutional: Negative for fever and chills.  Respiratory: Negative for cough.   Cardiovascular: Negative for chest pain.  Gastrointestinal: Negative for vomiting and abdominal pain.  Skin: Negative.  Negative for rash.       Abscess on lower back area, draining- as reported by nursing.  Psychiatric/Behavioral: Positive for depression.       (+) psychosis and thought disorder    Blood pressure 110/64, pulse 86, temperature 98.5 F (36.9 C), temperature source Oral, resp. rate 18.There is no weight on file to calculate BMI.  General Appearance: improved grooming   Eye Contact::  improved   Speech:  improving - more verbal and communicative   Volume:  Normal  Mood:  less irritable and affect somewhat less blunted and more reactive   Affect:  Improving   Thought Process:  still disorganized but improving  Orientation: appears attentive and fully alert, does not appear delirious or confused   Thought Content:  remains delusional , with bizarre delusion that she gave birth to a snake- see above- no hallucinations at this time and does not appear internally preoccupied -  Suicidal Thoughts:  No- at this time denies any suicidal or homicidal ideations  Homicidal Thoughts:  No  Memory:  Recent and remote grossly intact  Judgement:  Impaired  Insight:  Lacking  Psychomotor Activity:  Normal  Concentration:  Fair  Recall:  Good  Fund of Knowledge:Good  Language: Fair  Akathisia:  No  Handed:  Right  AIMS (if indicated):     Assets:  Desire for Improvement Resilience  Sleep:  Number of Hours:  3   Musculoskeletal: Strength & Muscle Tone: within normal limits Gait & Station: normal Patient leans: N/A  Current Medications: Current Facility-Administered Medications  Medication Dose Route Frequency Provider Last Rate Last Dose  . acetaminophen (TYLENOL) tablet 650 mg  650 mg Oral Q6H PRN Kerry Hough, PA-C      . albuterol (PROVENTIL HFA;VENTOLIN HFA) 108 (90 BASE)  MCG/ACT inhaler 2 puff  2 puff Inhalation Q6H PRN Verne Spurr, PA-C   2 puff at 02/12/14 [phone]  . alum & mag hydroxide-simeth (MAALOX/MYLANTA) 200-200-20 MG/5ML suspension 30 mL  30 mL Oral Q4H PRN Kerry Hough, PA-C      . hydrOXYzine (ATARAX/VISTARIL) tablet 50 mg  50 mg Oral QHS Shuvon Rankin, NP   50 mg at 02/11/14 2052  . ibuprofen (ADVIL,MOTRIN) tablet 600 mg  600 mg Oral Q6H PRN Jomarie Longs, MD   600 mg at 02/12/14 0414  . LORazepam (ATIVAN) tablet 2 mg  2 mg Oral Q6H PRN Nehemiah Massed, MD   2 mg at 02/12/14 0414  . magnesium hydroxide (MILK OF MAGNESIA) suspension 30 mL  30 mL Oral Daily PRN Shuvon Rankin, NP      . nicotine polacrilex (NICORETTE) gum 2 mg  2 mg Oral PRN Jomarie Longs, MD   2 mg at 02/12/14 0612  . nitrofurantoin (macrocrystal-monohydrate) (MACROBID) capsule 100 mg  100 mg Oral BID Shuvon Rankin, NP   100 mg at 02/12/14 0834  . OLANZapine zydis (ZYPREXA) disintegrating tablet 20 mg  20 mg Oral QHS Jomarie Longs, MD   20 mg at 02/11/14 2052  . sulfamethoxazole-trimethoprim (BACTRIM DS) 800-160 MG per tablet 1 tablet  1 tablet Oral Q12H Jomarie Longs, MD   1 tablet at 02/12/14 0834  . traZODone (DESYREL) tablet 100 mg  100 mg Oral QHS,MR X 1 Kerry Hough, PA-C   100 mg at 02/11/14 2053  . Valproic Acid (DEPAKENE) 250 MG/5ML syrup SYRP 1,000 mg  1,000 mg Oral BID Jomarie Longs, MD   1,000 mg at 02/12/14 0758  . ziprasidone (GEODON) capsule 40 mg  40 mg Oral Q12H PRN Jomarie Longs, MD   40 mg at 02/11/14 1215   Or  . ziprasidone (GEODON) injection 20 mg  20 mg Intramuscular Q12H PRN Jomarie Longs, MD   20 mg at 02/10/14 1322    Lab Results:  Results for orders placed during the hospital encounter of 02/06/14 (from the past 48 hour(s))  VALPROIC ACID LEVEL     Status: None   Collection Time    02/10/14  7:35 PM      Result Value Ref Range   Valproic Acid Lvl 82.4  50.0 - 100.0 ug/mL   Comment: Performed at [facility]  CBC WITH DIFFERENTIAL      Status: Abnormal   Collection Time    02/10/14  7:35 PM      Result Value Ref Range   WBC 8.1  4.0 - 10.5 K/uL   RBC 3.78 (*) 3.87 - 5.11 MIL/uL   Hemoglobin 10.9 (*) 12.0 - 15.0 g/dL   HCT 82.9 (*) 56.2 - 13.0 %   MCV 86.8  78.0 - 100.0 fL   MCH 28.8  26.0 - 34.0 pg   MCHC 33.2  30.0 - 36.0 g/dL   RDW 86.5  78.4 - 69.6 %   Platelets 372  150 - 400 K/uL   Neutrophils Relative % 62  43 - 77 %   Neutro Abs 5.0  1.7 - 7.7  K/uL   Lymphocytes Relative 26  12 - 46 %   Lymphs Abs 2.1  0.7 - 4.0 K/uL   Monocytes Relative 10  3 - 12 %   Monocytes Absolute 0.8  0.1 - 1.0 K/uL   Eosinophils Relative 2  0 - 5 %   Eosinophils Absolute 0.2  0.0 - 0.7 K/uL   Basophils Relative 0  0 - 1 %   Basophils Absolute 0.0  0.0 - 0.1 K/uL   Comment: Performed at [facility]  GLUCOSE, CAPILLARY     Status: Abnormal   Collection Time    02/11/14  6:54 AM      Result Value Ref Range   Glucose-Capillary 109 (*) 70 - 99 mg/dL  GLUCOSE, CAPILLARY     Status: None   Collection Time    02/12/14  5:02 AM      Result Value Ref Range   Glucose-Capillary 96  70 - 99 mg/dL    Physical Findings: AIMS: Facial and Oral Movements Muscles of Facial Expression: None, normal Lips and Perioral Area: None, normal Jaw: None, normal Tongue: None, normal,Extremity Movements Upper (arms, wrists, hands, fingers): None, normal Lower (legs, knees, ankles, toes): None, normal, Trunk Movements Neck, shoulders, hips: None, normal, Overall Severity Severity of abnormal movements (highest score from questions above): None, normal Incapacitation due to abnormal movements: None, normal Patient's awareness of abnormal movements (rate only patient's report): No Awareness,    CIWA:  CIWA-Ar Total: 2 COWS:     Assessment-  Although still psychotic and disorganized, there is significant improvement, particularly noticeable in improved grooming, decreased irritability, being more easily redirectable. She denies  medication side effects. She states Zyprexa is helping. Of note, she does not seem amenable to consider Depot Antipsychotic meds at this time.   Treatment Plan Summary: Daily contact with patient to assess and evaluate symptoms and progress in treatment Medication management See below   Plan: Continue inpatient treatment. At this time , as discussed with Nursing staff, have discontinued 1:1 observation status. Depakene now at  1000 mgrs BID . Increase Zyprexa Zydis to 25  mg QHS. Geodon and Ativan PRNS for severe agitation if needed     Medical Decision Making Problem Points:  Established problem, worsening (2), Review of last therapy session (1) and Review of psycho-social stressors (1) Data Points:  Review of medication regiment & side effects (2) Review of new medications or change in dosage (2)  I certify that inpatient services furnished can reasonably be expected to improve the patient's condition.   Nehemiah Massed MD 02/12/2014, 11:54 AM

## 2014-02-12 NOTE — Progress Notes (Signed)
D: Patient presents with labile mood and affect. She reported on the self inventory sheet that she's sleeping fair, appetite is good, high energy level and poor ability to concentrate. Patient rates depression "10", feelings of hopelessness "0" and anxiety "2". She can be intrusive and unaware of personal space, use of loose associations. Patient has taken all medications today, but at times may verbalize that she does not want them and walks away for a few minutes and then return and state she wants the medications now.  A: Support and encouragement provided to patient. Scheduled medications administered per MD orders. Maintain Q15 minute checks for safety.  R: Patient receptive. Denies SI/HI/AVH. Patient remains safe.

## 2014-02-12 NOTE — Progress Notes (Signed)
D: Patient has been argumentative and delusional today.  Patient upset with Clinical research associatewriter because patient tried to go into another patients room and Clinical research associatewriter stopped her .  Patient gt upset and states she did not want writer to talk to her and she [employer] writer stepped on her pet snake.  Patient has been argumentative with staff.  Patient continuing to ask for staffs jewelry.  Patient denies SI/HI and denies AVH. A: Staff to monitor Q 15 mins for safety.  Encouragement and support offered.  Scheduled medications administered per orders.  Nicorette gum administered prn and ibuprofen administered prn for pain. R: Patient remains safe on the unit.  Patient attended group tonight.  Patient visible on the unit.  Patient taking administered medications.

## 2014-02-12 NOTE — [facility] Group Notes (Signed)
[facility] [name] Group Therapy  02/12/2014 , 12:37 PM   Type of Therapy:  Group Therapy  Participation Level:  [name] was in group at the start.  She was called out for an interview with a community agency, and did not return.  Summary of Progress/Problems: Today's group focused on the term Diagnosis.  Participants were asked to define the term, and then pronounce whether it is a negative, positive or neutral term.  Daryel Geraldorth, Mira Balon B 02/12/2014 , 12:37 PM

## 2014-02-12 NOTE — Progress Notes (Signed)
1:1 note  Patient awoke around 0410 complaining of a headache and anxiety.  Writer gave patient ibuprofen and ativan.  Patient went to her room and sat in the chair.  Patient came up to the window again asking for her pet snake.  Patient loud in the hallway and being disruptive but easily redirected.  Patient then came up again with sitter and put her cup on the water fountain when sitter asked her to throw it away she became argumentative with him and loud.  Sitter had to be changed out at this time.  Writer assisted patient back to her room and patient states she did not like the way the sitter was talking to her.  Patient agreed she would rather have another sitter and states she would stay in her room until 0600am.  Patient has no been wearing underwear and has been naked from the waste down in her room.  Writer encouraged her to wear mesh panties and pants.  Patient agreed to put on pants.  Writer left patient room and patient was sitting on the bed quietly.  Patient does appear drowsy.

## 2014-02-12 NOTE — [facility] Group Notes (Addendum)
The focus of this group is to educate the patient on the purpose and policies of crisis stabilization and provide a format to answer questions about their admission.  The group details unit policies and expectations of patients while admitted.  Patient attended nurse education orientation group this morning.  Patient did not participate in group, turned head away from leader.

## 2014-02-12 NOTE — Progress Notes (Signed)
[facility] Post 1:1 Observation Documentation  For the first (8) hours following discontinuation of 1:1 precautions, a progress note entry by nursing staff should be documented at least every 2 hours, reflecting the patient's behavior, condition, mood, and conversation.  Use the progress notes for additional entries.  Time 1:1 discontinued: 10:16  Patient's Behavior: labile  Patient's Condition: alert, oriented x3  Patient's Conversation: tangential, assertive, verbally aggressive  Melanee SpryByrd, Ronecia E 02/12/2014, 11:06 AM

## 2014-02-12 NOTE — Progress Notes (Signed)
[facility] Post 1:1 Observation Documentation  For the first (8) hours following discontinuation of 1:1 precautions, a progress note entry by nursing staff should be documented at least every 2 hours, reflecting the patient's behavior, condition, mood, and conversation.  Use the progress notes for additional entries.  Time 1:1 discontinued: 10:16 am  Patient's Behavior: Calm, no interaction with others at this time  Patient's Condition: Eye contact is very intense, blunted affect  Patient's Conversation: Minimal; patient is in her room lying down.  Melanee SpryByrd, Angeleah Labrake E 02/12/2014, 6:31 PM

## 2014-02-12 NOTE — Progress Notes (Signed)
[facility] Post 1:1 Observation Documentation  For the first (8) hours following discontinuation of 1:1 precautions, a progress note entry by nursing staff should be documented at least every 2 hours, reflecting the patient's behavior, condition, mood, and conversation.  Use the progress notes for additional entries.  Time 1:1 discontinued: 10:16   Patient's Behavior: labile  Patient's Condition: alert, oriented; no s/s of distress noted  Patient's Conversation: assertive    Melanee SpryByrd, Ronecia E 02/12/2014, 12:19 PM

## 2014-02-12 NOTE — Progress Notes (Signed)
1:1 Nursing Note  D: Patient is sitting in the hallway having a conversation with MHT/sitter; no s/s of distress noted.  A: Maintain close observation and Q15 minute checks for safety.  R: Patient remains safe on the unit.

## 2014-02-12 NOTE — Progress Notes (Signed)
[facility] Post 1:1 Observation Documentation  For the first (8) hours following discontinuation of 1:1 precautions, a progress note entry by nursing staff should be documented at least every 2 hours, reflecting the patient's behavior, condition, mood, and conversation.  Use the progress notes for additional entries.  Time 1:1 discontinued: 10:16 am   Patient's Behavior: labile, irritable  Patient's Condition: Patient is more agitated at this time.    Patient's Conversation: verbally aggressive, resistant to care   Melanee SpryByrd, Ronecia E 02/12/2014, 5:41 PM

## 2014-02-13 MED ORDER — HALOPERIDOL 5 MG PO TABS
5.0000 mg | ORAL_TABLET | Freq: Every day | ORAL | Status: DC
  Administered 2014-02-13 – 2014-02-14 (×2): 5 mg via ORAL
  Filled 2014-02-13 (×3): qty 1

## 2014-02-13 MED ORDER — OLANZAPINE 10 MG PO TBDP
10.0000 mg | ORAL_TABLET | Freq: Every day | ORAL | Status: DC
  Administered 2014-02-13: 10 mg via ORAL
  Filled 2014-02-13 (×2): qty 1

## 2014-02-13 NOTE — Progress Notes (Signed)
Patient refused CBG this morning.  

## 2014-02-13 NOTE — Progress Notes (Signed)
Patient ID: Molly [name], female   DOB: 22-May-1985, 28 y.o.   MRN: [id number] [facility]  02/13/2014 2:27 PM Molly [name]  MRN:  [id number]  Subjective:   Patient states she is going " all right" Objective:  I have discussed case with Nursing Staff and seen patient. She has improved partially, but she remains disorganized, delusional . Grooming is partially improved, and  Although she is less intrusive , less disruptive and more redirectable, she is still having difficulty with boundaries. For example, she was asking RN to " lend me your wig"  And asking writer to share some of the pudding she is eating . She continues to have delusional thoughts related to pregnancy - states that recently , prior to admission, she felt " I had a snake coming down my birth canal" although today presents with less intense preoccupation about this. She also states " I do think I am pregnant- I know the tests have come out negative, but maybe my pregnancy is different and needs other tests". Of note, today she is agreeing to ACT team referral and to a depot IM antipsychotic, which I have encouraged her to consider based on her history of chronic mental illness and recent " cheeking" of PO meds. She had been reluctant but today states " I'm okay with that". On unit behavior is varible, ranging from irritable to calm. Limited milieu participation. At this time denies medication side effects.     Diagnosis:   Primary Psychiatric Diagnosis: Schizophrenia ,multiple episodes, currently in acute episode versus Schizoaffective disorder,bipolar type  Total Time spent with patient: 25 minutes   ADL's: fair   Sleep:fair  Appetite:  Good   Psychiatric Specialty Exam: Physical Exam  Skin:  Patient per nursing staff has a large purulent abcess vs boil ,which is draining ,on her buttocks. Patient is not cooperative with exam    Review of Systems  Constitutional: Negative for fever and chills.   Respiratory: Negative for cough.   Cardiovascular: Negative for chest pain.  Gastrointestinal: Negative for vomiting and abdominal pain.  Skin: Negative.  Negative for rash.       Abscess on lower back area, draining- as reported by nursing.  Psychiatric/Behavioral: Positive for depression.       (+) psychosis and thought disorder    Blood pressure 110/64, pulse 86, temperature 98.5 F (36.9 C), temperature source Oral, resp. rate 18.There is no weight on file to calculate BMI.  General Appearance: improved grooming   Eye Contact::  improved   Speech:  improving - more verbal and communicative   Volume:  Normal  Mood:  still intermittently irritable. Does smile more and affect is less blunted and hostile than upon admission  Affect:  Improving - as above   Thought Process:  still disorganized but improving  Orientation: appears attentive and fully alert, does not appear delirious or confused   Thought Content: ongoing delusional thoughts , denies hallucinations  Suicidal Thoughts:  No- at this time denies any suicidal or homicidal ideations  Homicidal Thoughts:  No  Memory:  Recent and remote grossly intact  Judgement:  Impaired  Insight:  Lacking  Psychomotor Activity:  Normal  Concentration:  Fair  Recall:  Good  Fund of Knowledge:Good  Language: Fair  Akathisia:  No  Handed:  Right  AIMS (if indicated):     Assets:  Desire for Improvement Resilience  Sleep:  Number of Hours: 5.5   Musculoskeletal: Strength & Muscle Tone: within  normal limits Gait & Station: normal Patient leans: N/A  Current Medications: Current Facility-Administered Medications  Medication Dose Route Frequency Provider Last Rate Last Dose  . acetaminophen (TYLENOL) tablet 650 mg  650 mg Oral Q6H PRN Kerry Hough, PA-C      . albuterol (PROVENTIL HFA;VENTOLIN HFA) 108 (90 BASE) MCG/ACT inhaler 2 puff  2 puff Inhalation Q6H PRN Verne Spurr, PA-C   2 puff at 02/12/14 1546  . alum & mag  hydroxide-simeth (MAALOX/MYLANTA) 200-200-20 MG/5ML suspension 30 mL  30 mL Oral Q4H PRN Kerry Hough, PA-C      . hydrOXYzine (ATARAX/VISTARIL) tablet 50 mg  50 mg Oral QHS Shuvon Rankin, NP   50 mg at 02/12/14 2127  . ibuprofen (ADVIL,MOTRIN) tablet 600 mg  600 mg Oral Q6H PRN Jomarie Longs, MD   600 mg at 02/12/14 2226  . LORazepam (ATIVAN) tablet 2 mg  2 mg Oral Q6H PRN Nehemiah Massed, MD   2 mg at 02/12/14 0414  . magnesium hydroxide (MILK OF MAGNESIA) suspension 30 mL  30 mL Oral Daily PRN Shuvon Rankin, NP      . nicotine polacrilex (NICORETTE) gum 2 mg  2 mg Oral PRN Jomarie Longs, MD   2 mg at 02/13/14 1403  . nitrofurantoin (macrocrystal-monohydrate) (MACROBID) capsule 100 mg  100 mg Oral BID Shuvon Rankin, NP   100 mg at 02/13/14 1218  . OLANZapine zydis (ZYPREXA) disintegrating tablet 25 mg  25 mg Oral QHS Nehemiah Massed, MD   25 mg at 02/12/14 2110  . sulfamethoxazole-trimethoprim (BACTRIM DS) 800-160 MG per tablet 1 tablet  1 tablet Oral Q12H Jomarie Longs, MD   1 tablet at 02/13/14 1217  . traZODone (DESYREL) tablet 100 mg  100 mg Oral QHS,MR X 1 Kerry Hough, PA-C   100 mg at 02/12/14 2110  . Valproic Acid (DEPAKENE) 250 MG/5ML syrup SYRP 1,000 mg  1,000 mg Oral BID Jomarie Longs, MD   1,000 mg at 02/13/14 1217  . ziprasidone (GEODON) capsule 40 mg  40 mg Oral Q12H PRN Jomarie Longs, MD   40 mg at 02/12/14 1734   Or  . ziprasidone (GEODON) injection 20 mg  20 mg Intramuscular Q12H PRN Jomarie Longs, MD   20 mg at 02/10/14 1322    Lab Results:  Results for orders placed during the hospital encounter of 02/06/14 (from the past 48 hour(s))  GLUCOSE, CAPILLARY     Status: None   Collection Time    02/12/14  5:02 AM      Result Value Ref Range   Glucose-Capillary 96  70 - 99 mg/dL    Physical Findings: AIMS: Facial and Oral Movements Muscles of Facial Expression: None, normal Lips and Perioral Area: None, normal Jaw: None, normal Tongue: None, normal,Extremity  Movements Upper (arms, wrists, hands, fingers): None, normal Lower (legs, knees, ankles, toes): None, normal, Trunk Movements Neck, shoulders, hips: None, normal, Overall Severity Severity of abnormal movements (highest score from questions above): None, normal Incapacitation due to abnormal movements: None, normal Patient's awareness of abnormal movements (rate only patient's report): No Awareness,    CIWA:  CIWA-Ar Total: 2 COWS:     Assessment-  Slow improvement. Remains delusional, intermittently irritable, disorganized. She is , however, better than upon initial presentation, and is now off 1:1 Observation, less angry and irritable, and less intrusive. Of note, today is agreeing to an IM depot antipsychotic , which is considered  Significant as she had been refusing this and has a history  of limited insight into mental illness and " cheeking" PO meds recently.   Treatment Plan Summary: Daily contact with patient to assess and evaluate symptoms and progress in treatment Medication management See below   Plan: Continue inpatient treatment. Depakene now at  1000 mgrs BID . As discussed with colleague and Pharmacist, will initiate Haldol 5 mgrs PO QDAY, and decrease  Zyprexa to 10 mgrs QHS. The goal is to switch to PO Haldol, in anticipation of first Haldol Decanoate IM injection soon. Geodon and Ativan PRNS for severe agitation if needed     Medical Decision Making Problem Points:  Established problem, stable/improving (1), Review of last therapy session (1) and Review of psycho-social stressors (1) Data Points:  Review of medication regiment & side effects (2) Review of new medications or change in dosage (2)  I certify that inpatient services furnished can reasonably be expected to improve the patient's condition.   Nehemiah MassedOBOS, FERNANDO MD 02/13/2014, 2:27 PM

## 2014-02-13 NOTE — [facility] Group Notes (Signed)
[name]   02/13/2014 9:26 AM  Participation Quality: Invited. Did not attend.     [name],[name]

## 2014-02-13 NOTE — Progress Notes (Signed)
Pt stated she had an "ok" day.

## 2014-02-13 NOTE — [facility] Group Notes (Signed)
[facility] [name] Group Therapy  02/13/2014 1:21 PM  Type of Therapy:  Group Therapy  Participation Level:  None  Participation Quality:  Inattentive, Intrusive   Affect:  Irritable  Cognitive:  Alert  Insight:  Limited  Engagement in Therapy:  Limited  Modes of Intervention:  Discussion, Education, Socialization and Support  Summary of Progress/Problems:[employer] ([employer]) speaker came to talk about his personal journey with substance abuse and mental illness. Group members were challenged to process ways by which to relate to the speaker. [employer] speaker provided handouts and educational information pertaining to groups and services offered by [facility]. Molly [name] continuously walked in and out of group. She was able to identify other agencies similar to [employer] in the community.     [name],Molly 02/13/2014, 1:21 PM

## 2014-02-13 NOTE — Progress Notes (Signed)
D: Patient has been sleep the entire morning and just awakening at noon. She's agitated and being demanding at this time. Patient has not participated in any groups today. Writer spoke with Cobos, MD about the patient's behavior and asked if he would like for me to administer her morning medications. Patient continues to adhere to medication regimen.  A: Support and encouragement provided to patient. Administered scheduled medications per ordering MD. Monitor Q15 minute checks for safety.   R: Patient receptive. Denies SI/HI. Patient remains safe on the unit.

## 2014-02-14 MED ORDER — LORAZEPAM 1 MG PO TABS
1.0000 mg | ORAL_TABLET | Freq: Two times a day (BID) | ORAL | Status: DC
  Administered 2014-02-14 – 2014-02-20 (×12): 1 mg via ORAL
  Filled 2014-02-14 (×12): qty 1

## 2014-02-14 MED ORDER — OLANZAPINE 5 MG PO TBDP
5.0000 mg | ORAL_TABLET | Freq: Every day | ORAL | Status: DC
  Administered 2014-02-14: 5 mg via ORAL
  Filled 2014-02-14 (×3): qty 1

## 2014-02-14 MED ORDER — HALOPERIDOL 5 MG PO TABS
5.0000 mg | ORAL_TABLET | Freq: Two times a day (BID) | ORAL | Status: DC
  Administered 2014-02-15: 5 mg via ORAL
  Filled 2014-02-14 (×5): qty 1

## 2014-02-14 NOTE — Progress Notes (Signed)
Patient slept all morning until doctor awoke for meeting. Patient remains labile, hostile, angry and argumentative. Demanding and unwilling to wait. Expects to be helped immediately even when staff working with another patient. Complaining of a headache of 10/10 however this is incongruent with behavior. Also complaining of SOB. Medicated per orders. Given tylenol prn and inhaler prn. Redirected as needed though with little success. Denies psychiatric symptoms. Remains delusional in thinking. Pt safe. Lawrence MarseillesFriedman, Ely Ballen Eakes

## 2014-02-14 NOTE — [facility] Group Notes (Signed)
[facility] Group Notes:  (Counselor/Nursing/MHT/Case Management/Adjunct)  02/14/2014 1:15PM  Type of Therapy:  Group Therapy  Participation Level:  [name] wandered in and out of group several times.  Did not stay.  Summary of Progress/Problems: The topic for group was balance in life.  Pt participated in the discussion about when their life was in balance and out of balance and how this feels.  Pt discussed ways to get back in balance and short term goals they can work on to get where they want to be.    Daryel Geraldorth, Karmela Bram B 02/14/2014 1:45 PM

## 2014-02-14 NOTE — Progress Notes (Signed)
Pt alert, agitated, irritable and verbally aggressive. Presents delusional, paranoid with loose association and derealization.  Pt is intrusive/ demanding with staff and peers. No insight and difficult to assess. Asking writer is she is sleeping with Tupac. When given ATB medication to take pt walked away with medication in hand and refused to return to Clinical research associatewriter. When asked later pt reports taking. Pt continuously redirected as needed. Will continue to monitor closely and evaluate for stabilization.
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
## 2014-02-14 NOTE — Tx Team (Signed)
  Interdisciplinary Treatment Plan Update   Date Reviewed:  02/14/2014  Time Reviewed:  10:35 AM  Progress in Treatment:   Attending groups: No Participating in groups: No Taking medication as prescribed: Yes  Tolerating medication: Yes Family/Significant other contact made: No, no supports identified.  Patient understands diagnosis: No, Limited insight  Discussing patient identified problems/goals with staff: Yes  See initial care plan Medical problems stabilized or resolved: Yes Denies suicidal/homicidal ideation: Yes  In tx team Patient has not harmed self or others: Yes  For review of initial/current patient goals, please see plan of care.  Estimated Length of Stay: 4-5 days   Reason for Continuation of Hospitalization: Delusions  Medication stabilization Other; describe Paranoia   New Problems/Goals identified:  N/A  Discharge Plan or Barriers:  Pt has been referred to an ACTT. CSW is assessing for possibility of connecting her to a group home.    Additional Comments: She has improved partially, but she remains disorganized, delusional . Grooming is partially improved, and although she is less intrusive , less disruptive and more redirectable, she is still having difficulty with boundaries.She continues to have delusional thoughts related to pregnancy - states that recently , prior to admission, she felt " I had a snake coming down my birth canal" although today presents with less intense preoccupation about this. She also states " I do think I am pregnant- I know the tests have come out negative, but maybe my pregnancy is different and needs other tests". At this time we are working on switching from Zyprexa to Haldol, with goal of being able to give an injection to improve compliance.       Attendees:  Signature: Ivin BootySarama Eappen, MD 02/14/2014 10:35 AM   Signature: Richelle Itood Sedrick Tober, [name] 02/14/2014 10:35 AM  Signature:  02/14/2014 10:35 AM  Signature: Harold Barbanonecia Byrd, RN  02/14/2014  10:35 AM  Signature:  02/14/2014 10:35 AM  Signature:  02/14/2014 10:35 AM  Signature:   02/14/2014 10:35 AM  Signature:    Signature:    Signature:    Signature:    Signature:    Signature:      Scribe for Treatment Team:   Richelle Itood Virginia Francisco, [name]  02/14/2014 10:35 AM

## 2014-02-14 NOTE — Progress Notes (Signed)
D: Pt denies SI/HI/AVH. Pt is labile, intrusive rude and has to be constantly redirected. Pt appears to be focused on one thing and if not talking about it , pt is not listening. Pt gets loud, and verbally aggressive, and tangential.   A: Pt was offered support and encouragement. Pt was given scheduled medications. Pt was encourage to attend groups. Q 15 minute checks were done for safety.   R:Pt attends groups and interacts well with peers and staff. Pt is taking medication.Pt receptive to treatment and safety maintained on unit.

## 2014-02-14 NOTE — [facility] Group Notes (Signed)
[facility] Group Notes:  (Nursing/MHT/Case Management/Adjunct)  Date:  02/14/2014  Time:  10:30am  Type of Therapy:  Nurse Education  Participation Level:  Did not attend  Participation Quality:    Affect:    Cognitive:    Insight:    Engagement in Group:    Modes of Intervention:    Summary of Progress/Problems:  Lawrence MarseillesFriedman, Shannan Slinker Eakes 02/14/2014, 12:31 PM

## 2014-02-14 NOTE — Plan of Care (Signed)
Problem: Ineffective individual coping Goal: STG:Pt. will utilize relaxation techniques to reduce stress STG: Patient will utilize relaxation techniques to reduce stress levels  Outcome: Not Progressing Patient continues to be agitated and angry. Yelling and cursing at peers and staff. Not receptive to coping skills.  Problem: Diagnosis: Increased Risk For Suicide Attempt Goal: STG-Patient Will Attend All Groups On The Unit Outcome: Not Progressing Patient attends some, but not all, groups.

## 2014-02-14 NOTE — Progress Notes (Signed)
Patient ID: Molly [name], female   DOB: 04/15/1986, 28 y.o.   MRN: [id number] PER STATE REGULATIONS 482.30  THIS CHART WAS REVIEWED FOR MEDICAL NECESSITY WITH RESPECT TO THE PATIENT'S ADMISSION/ DURATION OF STAY.  NEXT REVIEW DATE:  02/17/2014  Willa RoughJENNIFER JONES Vittoria Noreen, RN, BSN CASE MANAGER

## 2014-02-15 LAB — GLUCOSE, CAPILLARY: Glucose-Capillary: 114 mg/dL — ABNORMAL HIGH (ref 70–99)

## 2014-02-15 MED ORDER — HALOPERIDOL 5 MG PO TABS
10.0000 mg | ORAL_TABLET | Freq: Every day | ORAL | Status: DC
  Administered 2014-02-15 – 2014-02-20 (×6): 10 mg via ORAL
  Filled 2014-02-15 (×6): qty 2
  Filled 2014-02-15: qty 9
  Filled 2014-02-15 (×2): qty 2
  Filled 2014-02-15: qty 9

## 2014-02-15 MED ORDER — HALOPERIDOL 5 MG PO TABS
5.0000 mg | ORAL_TABLET | Freq: Every day | ORAL | Status: DC
  Administered 2014-02-16 – 2014-02-19 (×4): 5 mg via ORAL
  Filled 2014-02-15 (×7): qty 1

## 2014-02-15 MED ORDER — ONDANSETRON 4 MG PO TBDP
8.0000 mg | ORAL_TABLET | Freq: Three times a day (TID) | ORAL | Status: DC | PRN
  Administered 2014-02-17 – 2014-02-18 (×3): 8 mg via ORAL
  Filled 2014-02-15 (×3): qty 2

## 2014-02-15 NOTE — Progress Notes (Signed)
Patient ID: Molly FreshJessica S [name], female   DOB: 05/20/1985, 28 y.o.   MRN: [id number] D. Patient presents with irritable mood, affect labile . Molly [name] continues to be intrusive, irritable and demanding at times. She continues to require frequent redirection from staff. Pt in am refusing to get out of bed to take medications. A. Support, redirection and encouragement provided. Medications given as ordered. Discussed patient progress with treatment team. R. Patient continues to require redirection. Patient is safe. Will continue to monitor q 15 minutes for safety.

## 2014-02-15 NOTE — [facility] Group Notes (Signed)
[name]   02/15/2014 9:16 AM  Participation Quality:  Invited. Did not attend.     [name],[name]

## 2014-02-15 NOTE — [facility] Group Notes (Signed)
[facility] [name] Group Therapy  02/15/2014  1:05 PM  Type of Therapy:  Group therapy  Participation Level:  Active  Participation Quality:  Attentive  Affect:  Flat  Cognitive:  Oriented  Insight:  Limited  Engagement in Therapy:  Limited  Modes of Intervention:  Discussion, Socialization  Summary of Progress/Problems:  Chaplain was here to lead a group on themes of hope and courage.  Slumped in chair for first ten minutes.  Got up and left.  Did not return.  Daryel Geraldorth, Cross Jorge B 02/15/2014 1:23 PM

## 2014-02-15 NOTE — [facility] Group Notes (Signed)
Adult Psychoeducational Group Note  Date:  02/15/2014 Time:  9:30 PM  Group Topic/Focus:  Wrap-Up Group:   The focus of this group is to help patients review their daily goal of treatment and discuss progress on daily workbooks.  Participation Level:  Did Not Attend  Participation Quality:  None  Affect:  None  Cognitive:  None  Insight: None  Engagement in Group:  None  Modes of Intervention:  None  Additional Comments:  [name] did not attend group.  Caroll RancherLindsay, Giovani Neumeister A 02/15/2014, 9:30 PM

## 2014-02-15 NOTE — Progress Notes (Addendum)
Patient ID: ZOANN AGUALLO, female   DOB: 08/07/1985, 28 y.o.   MRN: [id number] [facility] MD Progress Note  02/15/2014 1:39 PM Molly [name]  MRN:  [id number]  Subjective:   Patient states she " is all right" She continues to describe cigarette cravings, and states nicotine patch and/ or gum are not particularly helpful. She  Denies medication side effects.  Objective:  As discussed with staff, patient remains intrusive, often irritable, and needing frequent redirection. However, compared to her presentation of earlier this week, there seems to be tangible improvement- she is somewhat more pleasant and cooperative, she is better groomed, and she is much less focused on bizarre delusions. Today does not express any delusions, and only stated in passing that she thinks " I may be pregnant". She does seem much less ruminative and focused on pregnancy delusions at this time. She is also no longer accusing staff of stealing jewelry as she had been earlier.  Patient is not endorsing side effects, and is agreeing to haldol decanoate/ IM depot medication, but expresses fear about pain from the needle. She is tolerating medications well and does not present with akathisia, no evidence of EPS. Group participation remains limited.   Diagnosis:   Primary Psychiatric Diagnosis: Schizophrenia ,multiple episodes, currently in acute episode versus Schizoaffective disorder,bipolar type  Total Time spent with patient: 20 minutes  ADL's: fair , but improving   Sleep: fair  Appetite:  Good   Psychiatric Specialty Exam: Physical Exam  Skin:  Patient per nursing staff has a large purulent abcess vs boil ,which is draining ,on her buttocks. Patient is not cooperative with exam    Review of Systems  Constitutional: Negative for fever and chills.  Respiratory: Negative for cough.   Cardiovascular: Negative for chest pain.  Gastrointestinal: Negative for vomiting and abdominal pain.  Skin: Negative.   Negative for rash.       Abscess on lower back area, draining- as reported by nursing.  Psychiatric/Behavioral: Positive for depression.       (+) psychosis and thought disorder    Blood pressure 124/72, pulse 140, temperature 98.2 F (36.8 C), temperature source Oral, resp. rate 20.There is no weight on file to calculate BMI.  General Appearance: although fairly groomed, she is better groomed than earlier this week  Eye Contact::  improved   Speech:  improving - more verbal and communicative   Volume:  variable   Mood:  less irritable- states " I feels ad sometimes "  Affect: less irritable today,affect more reactive   Thought Process:  less disorganized  Orientation: appears attentive and fully alert, does not appear delirious or confused   Thought Content:  Less intense delusional thoughts , denies hallucinations- today does not appear internally preoccupied   Suicidal Thoughts:  No- at this time denies any suicidal or homicidal ideations  Homicidal Thoughts:  No  Memory:  Recent and remote grossly intact  Judgement:  Impaired  Insight:  Lacking  Psychomotor Activity:  Normal  Concentration:  Fair  Recall:  Good  Fund of Knowledge:Good  Language: Fair  Akathisia:  No  Handed:  Right  AIMS (if indicated):     Assets:  Desire for Improvement Resilience  Sleep:  Number of Hours: 5.5   Musculoskeletal: Strength & Muscle Tone: within normal limits Gait & Station: normal Patient leans: N/A  Current Medications: Current Facility-Administered Medications  Medication Dose Route Frequency Provider Last Rate Last Dose  . acetaminophen (TYLENOL) tablet 650 mg  650  mg Oral Q6H PRN Kerry Hough, PA-C   650 mg at 02/14/14 1142  . albuterol (PROVENTIL HFA;VENTOLIN HFA) 108 (90 BASE) MCG/ACT inhaler 2 puff  2 puff Inhalation Q6H PRN Verne Spurr, PA-C   2 puff at 02/14/14 2106  . alum & mag hydroxide-simeth (MAALOX/MYLANTA) 200-200-20 MG/5ML suspension 30 mL  30 mL Oral Q4H PRN  Kerry Hough, PA-C      . haloperidol (HALDOL) tablet 5 mg  5 mg Oral BID Nehemiah Massed, MD   5 mg at 02/15/14 1106  . hydrOXYzine (ATARAX/VISTARIL) tablet 50 mg  50 mg Oral QHS Shuvon Rankin, NP   50 mg at 02/14/14 2106  . ibuprofen (ADVIL,MOTRIN) tablet 600 mg  600 mg Oral Q6H PRN Jomarie Longs, MD   600 mg at 02/12/14 2226  . LORazepam (ATIVAN) tablet 1 mg  1 mg Oral BID Nehemiah Massed, MD   1 mg at 02/15/14 1106  . LORazepam (ATIVAN) tablet 2 mg  2 mg Oral Q6H PRN Nehemiah Massed, MD   2 mg at 02/14/14 2221  . magnesium hydroxide (MILK OF MAGNESIA) suspension 30 mL  30 mL Oral Daily PRN Shuvon Rankin, NP      . nicotine polacrilex (NICORETTE) gum 2 mg  2 mg Oral PRN Jomarie Longs, MD   2 mg at 02/14/14 2150  . nitrofurantoin (macrocrystal-monohydrate) (MACROBID) capsule 100 mg  100 mg Oral BID Shuvon Rankin, NP   100 mg at 02/15/14 1107  . OLANZapine zydis (ZYPREXA) disintegrating tablet 5 mg  5 mg Oral QHS Nehemiah Massed, MD   5 mg at 02/14/14 2106  . sulfamethoxazole-trimethoprim (BACTRIM DS) 800-160 MG per tablet 1 tablet  1 tablet Oral Q12H Jomarie Longs, MD   1 tablet at 02/15/14 1106  . traZODone (DESYREL) tablet 100 mg  100 mg Oral QHS,MR X 1 Kerry Hough, PA-C   100 mg at 02/14/14 2221  . Valproic Acid (DEPAKENE) 250 MG/5ML syrup SYRP 1,000 mg  1,000 mg Oral BID Jomarie Longs, MD   1,000 mg at 02/15/14 1105    Lab Results:  Results for orders placed during the hospital encounter of 02/06/14 (from the past 48 hour(s))  GLUCOSE, CAPILLARY     Status: Abnormal   Collection Time    02/15/14  5:58 AM      Result Value Ref Range   Glucose-Capillary 114 (*) 70 - 99 mg/dL    Physical Findings: AIMS: Facial and Oral Movements Muscles of Facial Expression: None, normal Lips and Perioral Area: None, normal Jaw: None, normal Tongue: None, normal,Extremity Movements Upper (arms, wrists, hands, fingers): None, normal Lower (legs, knees, ankles, toes): None, normal, Trunk  Movements Neck, shoulders, hips: None, normal, Overall Severity Severity of abnormal movements (highest score from questions above): None, normal Incapacitation due to abnormal movements: None, normal Patient's awareness of abnormal movements (rate only patient's report): No Awareness, Dental Status Current problems with teeth and/or dentures?: No Does patient usually wear dentures?: No  CIWA:  CIWA-Ar Total: 2 COWS:     Assessment-  Patient seems to be improving gradually and partially. Intensity of delusions and of her disorganized thought process and behavior have improved. 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 She is tolerating Haldol and Depakene well thus far, and has not developed Akatisia or dystonias from haldol at this time.   Treatment Plan Summary:  Daily contact with patient to assess and evaluate symptoms and progress in treatment Medication management See below   Plan: Continue inpatient treatment. Depakene now at  1000 mgrs BID . Increase Haldol to 10 mgrs QAM and  5 mgrs QHS , and  D/C Zyprexa Ativan 1 mgrs BID, + PRNS if needed for agitation    Medical Decision Making Problem Points:  Established problem, stable/improving (1), Review of last therapy session (1) and Review of psycho-social stressors (1) Data Points:  Review of medication regiment & side effects (2) Review of new medications or change in dosage (2)  I certify that inpatient services furnished can reasonably be expected to improve the patient's condition.   Nehemiah Massed MD 02/15/2014, 1:39 PM

## 2014-02-15 NOTE — Progress Notes (Signed)
D: Pt denies SI/HI/AVH. Pt is  cooperative. Pt continues to be labile, but appears less agitated today. Pt woke up vomiting, but writer did not see vomit before pt flushed the toilet. Pt later was seen in the dayroom eating ice cream and other snacks. Writer will monitor pt for increased signs sx of worsening infection.   A: Pt was offered support and encouragement. Pt was given scheduled medications. Pt was encourage to attend groups. Q 15 minute checks were done for safety.   R:Pt is taking medication. Pt has no complaints at this time .Pt receptive to treatment and safety maintained on unit.

## 2014-02-15 NOTE — Progress Notes (Signed)
[facility] Group Notes:  (Nursing/MHT/Case Management/Adjunct)  Date:  02/15/2014  Time:  1:34 PM  Type of Therapy:  Therapeutic Activity  Participation Level:  Did Not Attend  Summary of Progress/Problems: Pt was in bed asleep at the time of group.  Caswell CorwinOwen, Alara Daniel C 02/15/2014, 1:34 PM

## 2014-02-16 DIAGNOSIS — F250 Schizoaffective disorder, bipolar type: Secondary | ICD-10-CM

## 2014-02-16 LAB — VALPROIC ACID LEVEL: Valproic Acid Lvl: 70.7 ug/mL (ref 50.0–100.0)

## 2014-02-16 NOTE — Progress Notes (Signed)
[facility] Group Notes:  (Nursing/MHT/Case Management/Adjunct)  Date:  02/16/2014  Time:  11:37 PM  Type of Therapy:  Psychoeducational Skills  Participation Level:  Minimal  Participation Quality:  Inattentive  Affect:  Irritable and Labile  Cognitive:  Lacking  Insight:  Limited  Engagement in Group:  Resistant  Modes of Intervention:  Education  Summary of Progress/Problems: The patient spoke softly in group and would not repeat what she just said. Patient's remarks were snappy and brief. She would only state that her day had been "okay" and would not elaborate any further. As a theme for the day, her coping skill will be to go shopping. Previously, the patient had indicated that she would smoke or do drugs as a coping skill before being redirected by this author.   Hazle CocaGOODMAN, Perina Salvaggio S 02/16/2014, 11:37 PM

## 2014-02-16 NOTE — Plan of Care (Signed)
Problem: Ineffective individual coping Goal: STG: Patient will remain free from self harm Outcome: Progressing Pt remains free from harm on unit  Problem: Diagnosis: Increased Risk For Suicide Attempt Goal: LTG-Patient Will Report Absence of Withdrawal Symptoms LTG (by discharge): Patient will report absence of withdrawal symptoms.  Outcome: Progressing Pt continues to crave cigaretts Goal: STG-Patient Will Comply With Medication Regime Outcome: Progressing Pt takes medications as prescribed

## 2014-02-16 NOTE — Progress Notes (Signed)
Patient ID: Molly [name], female   DOB: Dec 07, 1985, 28 y.o.   MRN: [id number] Patient ID: Molly [name], female   DOB: 07-20-1985, 28 y.o.   MRN: [id number] [facility] MD Progress Note  02/16/2014 1:23 PM Molly [name]  MRN:  [id number]   Subjective:  Patient is seen today and chart reviewed. She appeared in day room but not interacting with other peers. Patient has been compliant with her medication management and has denied side effects of medications and seems tolerating well. She has cravings for tobacco and marijuana. She has been actively participating on unit activities.   Objective: Patient is intrusive, often irritable, and needing frequent redirection. She is somewhat more pleasant and cooperative, and she is much less focused on bizarre delusions. She does less ruminative and focused on pregnancy delusions at this time. She is asking about different kinds of jewelry and phone etc.she states that she is okay to take haldol pills instead of needle medication. She is tolerating medications does not present with akathisia, no evidence of EPS.  Diagnosis:   Primary Psychiatric Diagnosis: Schizophrenia ,multiple episodes, currently in acute episode versus Schizoaffective disorder,bipolar type  Total Time spent with patient: 20 minutes  ADL's: fair , but improving   Sleep: fair  Appetite:  Good   Psychiatric Specialty Exam: Physical Exam  Skin:  Patient per nursing staff has a large purulent abcess vs boil ,which is draining ,on her buttocks. Patient is not cooperative with exam    Review of Systems  Constitutional: Negative for fever and chills.  Respiratory: Negative for cough.   Cardiovascular: Negative for chest pain.  Gastrointestinal: Negative for vomiting and abdominal pain.  Skin: Negative.  Negative for rash.       Abscess on lower back area, draining- as reported by nursing.  Psychiatric/Behavioral: Positive for depression.       (+) psychosis and thought disorder     Blood pressure 124/72, pulse 140, temperature 98.2 F (36.8 C), temperature source Oral, resp. rate 20.There is no weight on file to calculate BMI.  General Appearance: although fairly groomed, she is better groomed than earlier this week  Eye Contact::  improved   Speech:  improving - more verbal and communicative   Volume:  variable   Mood:  less irritable- states " I feels ad sometimes "  Affect: congruent with her mood   Thought Process:  less disorganized  Orientation: appears attentive and fully alert, does not appear delirious or confused   Thought Content:  delusional thoughts, occupied with jewelery and phone   Suicidal Thoughts:  No-  Homicidal Thoughts:  No  Memory:  Recent and remote grossly intact  Judgement:  Impaired  Insight:  Lacking  Psychomotor Activity:  Normal  Concentration:  Fair  Recall:  Good  Fund of Knowledge:Good  Language: Fair  Akathisia:  No  Handed:  Right  AIMS (if indicated):     Assets:  Desire for Improvement Resilience  Sleep:  Number of Hours: 6   Musculoskeletal: Strength & Muscle Tone: within normal limits Gait & Station: normal Patient leans: N/A  Current Medications: Current Facility-Administered Medications  Medication Dose Route Frequency Provider Last Rate Last Dose  . acetaminophen (TYLENOL) tablet 650 mg  650 mg Oral Q6H PRN Kerry Hough, PA-C   650 mg at 02/14/14 1142  . albuterol (PROVENTIL HFA;VENTOLIN HFA) 108 (90 BASE) MCG/ACT inhaler 2 puff  2 puff Inhalation Q6H PRN Verne Spurr, PA-C   2 puff at 02/15/14 2238  .  alum & mag hydroxide-simeth (MAALOX/MYLANTA) 200-200-20 MG/5ML suspension 30 mL  30 mL Oral Q4H PRN Kerry HoughSpencer E Simon, PA-C      . haloperidol (HALDOL) tablet 10 mg  10 mg Oral Daily Nehemiah MassedFernando Cobos, MD   10 mg at 02/16/14 [id number]  . haloperidol (HALDOL) tablet 5 mg  5 mg Oral QHS Nehemiah MassedFernando Cobos, MD      . hydrOXYzine (ATARAX/VISTARIL) tablet 50 mg  50 mg Oral QHS Shuvon Rankin, NP   50 mg at 02/15/14 2239  .  ibuprofen (ADVIL,MOTRIN) tablet 600 mg  600 mg Oral Q6H PRN Jomarie LongsSaramma Eappen, MD   600 mg at 02/12/14 2226  . LORazepam (ATIVAN) tablet 1 mg  1 mg Oral BID Nehemiah MassedFernando Cobos, MD   1 mg at 02/16/14 0717  . LORazepam (ATIVAN) tablet 2 mg  2 mg Oral Q6H PRN Nehemiah MassedFernando Cobos, MD   2 mg at 02/15/14 2238  . magnesium hydroxide (MILK OF MAGNESIA) suspension 30 mL  30 mL Oral Daily PRN Shuvon Rankin, NP      . nicotine polacrilex (NICORETTE) gum 2 mg  2 mg Oral PRN Jomarie LongsSaramma Eappen, MD   2 mg at 02/14/14 2150  . nitrofurantoin (macrocrystal-monohydrate) (MACROBID) capsule 100 mg  100 mg Oral BID Shuvon Rankin, NP   100 mg at 02/16/14 0800  . ondansetron (ZOFRAN-ODT) disintegrating tablet 8 mg  8 mg Oral Q8H PRN Court Joyharles E Kober, PA-C      . sulfamethoxazole-trimethoprim (BACTRIM DS) 800-160 MG per tablet 1 tablet  1 tablet Oral Q12H Jomarie LongsSaramma Eappen, MD   1 tablet at 02/16/14 0717  . traZODone (DESYREL) tablet 100 mg  100 mg Oral QHS,MR X 1 Kerry HoughSpencer E Simon, PA-C   100 mg at 02/16/14 0014  . Valproic Acid (DEPAKENE) 250 MG/5ML syrup SYRP 1,000 mg  1,000 mg Oral BID Jomarie LongsSaramma Eappen, MD   1,000 mg at 02/16/14 0715    Lab Results:  Results for orders placed during the hospital encounter of 02/06/14 (from the past 48 hour(s))  GLUCOSE, CAPILLARY     Status: Abnormal   Collection Time    02/15/14  5:58 AM      Result Value Ref Range   Glucose-Capillary 114 (*) 70 - 99 mg/dL    Physical Findings: AIMS: Facial and Oral Movements Muscles of Facial Expression: None, normal Lips and Perioral Area: None, normal Jaw: None, normal Tongue: None, normal,Extremity Movements Upper (arms, wrists, hands, fingers): None, normal Lower (legs, knees, ankles, toes): None, normal, Trunk Movements Neck, shoulders, hips: None, normal, Overall Severity Severity of abnormal movements (highest score from questions above): None, normal Incapacitation due to abnormal movements: None, normal Patient's awareness of abnormal movements (rate  only patient's report): No Awareness, Dental Status Current problems with teeth and/or dentures?: No Does patient usually wear dentures?: No  CIWA:  CIWA-Ar Total: 2 COWS:     Assessment-  Patient seems to be improving gradually and partially. Intensity of delusions and of her disorganized thought process and behavior have improved. 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 She is tolerating Haldol and Depakene well thus far, and has not developed Akatisia or dystonias from haldol at this time.   Treatment Plan Summary: Daily contact with patient to assess and evaluate symptoms and progress in treatment Medication management See below   Plan: Continue current inpatient treatment and medication below. Depakene 1000  mgrs BID . Continue Haldol to 10 mgrs QAM and  5 mgrs QHS Continue Ativan 1 mgrs BID, + PRNS if needed for agitation  Medical Decision Making Problem Points:  Established problem, stable/improving (1), Review of last therapy session (1) and Review of psycho-social stressors (1) Data Points:  Review of medication regiment & side effects (2) Review of new medications or change in dosage (2)  I certify that inpatient services furnished can reasonably be expected to improve the patient's condition.   Nehemiah SettleJONNALAGADDA,JANARDHAHA R. MD 02/16/2014, 1:23 PM

## 2014-02-16 NOTE — Progress Notes (Signed)
Patient ID: Molly [name], female   DOB: [birth year], 28 y.o.   MRN: [id number] D. Patient presents with irritable mood, affect labile again today.  Molly [name] continues to be intrusive, and irritable at times. She continues to dress inappropriately and has to be redirected from staff to change attire.She remains demanding asking '' I want glasses, and new clothes, where is the Child psychotherapistsocial worker, can't she give me some new sweat pants. I want your jewelry!''  She denies any SI/HI at this time. A. Support, redirection and encouragement provided. Medications given as ordered. Discussed patient progress with Dr. Shela CommonsJ. R. Patient continues to require redirection. Patient is safe. Will continue to monitor q 15 minutes for safety.

## 2014-02-16 NOTE — [facility] Group Notes (Signed)
[facility] Group Notes:  (Clinical Social Work)  02/16/2014  11:15-12:00PM  Summary of Progress/Problems:   The main focus of today's process group was to discuss patients' feelings about hospitalization, the stigma attached to mental health, and sources of motivation to stay well.  We then worked to identify a specific plan to avoid future hospitalizations when discharged from the hospital for this admission.  The patient expressed that she is in the hospital to stabilize on her medications.  She states that she feels a loss of freedom, particularly because she would really like to go outside and smoke a cigarette when she wants.  She interacted with CSW but not others during group.  She answered all questions posed, at times with insight and at other times without actually answering the question.  She made effort, however, and was encouraged by CSW to continue interacting.  Type of Therapy:  Group Therapy - Process  Participation Level:  Active  Participation Quality:  Attentive and Sharing  Affect:  Flat  Cognitive:  Confused  Insight:  Improving  Engagement in Therapy:  Engaged  Modes of Intervention:  Exploration, Discussion  Ambrose MantleMareida Grossman-Orr, [name] 02/16/2014, 1:24 PM

## 2014-02-16 NOTE — [facility] Group Notes (Signed)
[facility] Group Notes:  (Nursing/MHT/Case Management/Adjunct)  Date:  02/16/2014  Time:  0930  Type of Therapy:  Psychoeducational Skills--healthy coping skill  Participation Level:  Active  Participation Quality:  Inattentive  Affect:  Irritable  Cognitive:  Lacking  Insight:  Lacking and Limited  Engagement in Group:  Lacking and Limited  Modes of Intervention:  Discussion, Education and Exploration  Summary of Progress/Problems:  Malva LimesStrader, Carylon Tamburro 02/16/2014, 11:14 AM

## 2014-02-17 LAB — GLUCOSE, CAPILLARY: GLUCOSE-CAPILLARY: 93 mg/dL (ref 70–99)

## 2014-02-17 MED ORDER — DIVALPROEX SODIUM ER 500 MG PO TB24
1500.0000 mg | ORAL_TABLET | Freq: Every day | ORAL | Status: DC
  Filled 2014-02-17 (×3): qty 3

## 2014-02-17 MED ORDER — DIVALPROEX SODIUM ER 500 MG PO TB24
1000.0000 mg | ORAL_TABLET | Freq: Every day | ORAL | Status: DC
  Administered 2014-02-18: 1000 mg via ORAL
  Filled 2014-02-17 (×3): qty 2

## 2014-02-17 MED ORDER — VALPROIC ACID 250 MG/5ML PO SYRP
1500.0000 mg | ORAL_SOLUTION | Freq: Once | ORAL | Status: AC
  Administered 2014-02-17: 1500 mg via ORAL
  Filled 2014-02-17: qty 30

## 2014-02-17 MED ORDER — VALPROIC ACID 250 MG/5ML PO SYRP: 1500.0000 mg | ORAL_SOLUTION | Freq: Two times a day (BID) | ORAL | Status: DC

## 2014-02-17 NOTE — [facility] Group Notes (Signed)
[facility] Group Notes:  (Nursing/MHT/Case Management/Adjunct)  Date:  02/17/2014  Time:  0900  Type of Therapy: Self inventory review   Participation Level:  None  Participation Quality:  Inattentive  Affect:  Labile  Cognitive:  Appropriate  Insight:  Limited  Engagement in Group:  Poor  Modes of Intervention:  Discussion  Summary of Progress/Problems:   Oliva BustardShimp, Yona Stansbury Larraine 02/17/2014, 6:02 PM

## 2014-02-17 NOTE — Progress Notes (Addendum)
D:  Pt continues to be labile, demanding and irritable. Pt has little to no insight into her Tx and or recovery or respect of other people. Pt denies SI/HI/AVH.  Pt vomited again tonight (possible self induced). Pt given Zofran, then pt wanted ice cream and milk, was not given to pt .   A: Pt was offered support and encouragement. Pt was given scheduled medications. Pt was encourage to attend groups. Q 15 minute checks were done for safety .  R: Pt is taking medication. Pt receptive to treatment and safety maintained on unit.

## 2014-02-17 NOTE — Progress Notes (Signed)
Patient ID: Molly FreshJessica S [name], female   DOB: [birth year], 28 y.o.   MRN: [id number] D. Patient was observed by staff to be calling 911. Notified MD and order received to restrict phone use. Pt was educated about ward rules and that this behavior inappropriate. She became agitated cursing and banging things in her room. She later approached writer - was offered prn for agitation and accepted. R. Pt is safe . Will continue to monitor q 15 minutes for safety.

## 2014-02-17 NOTE — Progress Notes (Signed)
Patient ID: Molly FreshJessica S [name], female   DOB: [birth year], 28 y.o.   MRN: [id number] D. Patient presents with irritable mood, affect labile again today. Molly [name] continues to be intrusive, and irritable at times. This morning she sat in wheelchair in hallway of another peer and refused to get up. She then was resistant to take medications at medication window, appearing to fall asleep during medication pass (despite being fully alert moments before). She continues to require frequent redirection. Her dress remains inappropriate and staff continue to redirect pt. '' She denies any SI/HI at this time. A. Support, redirection and encouragement provided. Medications given as ordered. Discussed patient progress with Dr. Shela CommonsJ. R. Patient continues to require redirection. Patient is safe. Will continue to monitor q 15 minutes for safety.

## 2014-02-17 NOTE — [facility] Group Notes (Signed)
[facility] Group Notes:  (Clinical Social Work)  02/17/2014   11:15am-12:00pm  Summary of Progress/Problems:  The main focus of today's process group was to listen to a variety of genres of music and to identify that different types of music provoke different responses.  The patient then was able to identify personally what was soothing for them, as well as energizing.  The patient expressed understanding of concepts, as well as knowledge of how each type of music affected him/her and how this can be used at home as a wellness/recovery tool.  At the beginning of group, she identified her overall mood as depressed, rating her depression at 7 out of 10.  At the end of group, she stated she did not feel well, stated her overall depression was now 5 out of 10.  Type of Therapy:  Music Therapy   Participation Level:  Active  Participation Quality:  Attentive and Sharing  Affect:  Blunted  Cognitive:  Oriented  Insight:  Engaged  Engagement in Therapy:  Engaged  Modes of Intervention:   Activity, Exploration  Ambrose MantleMareida Grossman-Orr, [name] 02/17/2014, 12:30pm

## 2014-02-17 NOTE — [facility] Group Notes (Signed)
[facility] Group Notes:  (Nursing/MHT/Case Management/Adjunct)  Date:  02/17/2014  Time:  0930  Type of Therapy:  Psychoeducational Skills  Participation Level:  None  Participation Quality:  Appropriate  Affect:  Flat and Labile  Cognitive:  Lacking  Insight:  Limited  Engagement in Group:  Poor . Modes of Intervention:  Discussion  Summary of Progress/Problems: She was in group but did not say anything.  Oliva BustardShimp, Quetzal Meany Larraine 02/17/2014, 6:05 PM

## 2014-02-17 NOTE — Plan of Care (Signed)
Problem: Ineffective individual coping Goal: STG: Patient will remain free from self harm Outcome: Progressing Pt safe on unit     Problem: Diagnosis: Increased Risk For Suicide Attempt Goal: LTG-Patient Will Report Improved Mood and Deny Suicidal LTG (by discharge) Patient will report improved mood and deny suicidal ideation.  Outcome: Not Progressing Pt continues to be rude, irritable, demanding and intrusive

## 2014-02-17 NOTE — Progress Notes (Signed)
Patient ID: Molly [name], female   DOB: 1985/10/27, 28 y.o.   MRN: [id number] Patient ID: Molly [name], female   DOB: [birth year], 28 y.o.   MRN: [id number] Patient ID: Molly [name], female   DOB: 1985-05-26, 28 y.o.   MRN: [id number] [facility] MD Progress Note  02/17/2014 12:31 PM Molly [name]  MRN:  [id number]   Subjective:  Patient is seen today in her room and stated that she took some medication this morning and feeling some what sedated. Spoke with staff RN who stated that she is not doing well and reportedly her valproic acid level are less than previous one about six days ago, down form 82 to 70. She does not seems ready to be discharged but she says she may want to be discharged tomorrow. Patient has been compliant with her medication management and and tolerating well. She is not able to participate on unit activities this morning. Reportedly she is manic, intrusive, irritable, and needing frequent redirection. She is is much less focused on bizarre delusions.  Objective: Patient is in her bed and sedated and talking slowly or mumbling this morning. She does less ruminative and focused on pregnancy delusions at this time. She is asking about different kinds of jewelry and phone etc.she states that she is okay to take haldol pills instead of needle medication. She is tolerating medications does not present with akathisia, no evidence of EPS.  Diagnosis:   Primary Psychiatric Diagnosis: Schizophrenia ,multiple episodes, currently in acute episode versus Schizoaffective disorder,bipolar type  Total Time spent with patient: 20 minutes  ADL's: fair , but improving   Sleep: fair  Appetite:  Good   Psychiatric Specialty Exam: Physical Exam  Skin:  Patient per nursing staff has a large purulent abcess vs boil ,which is draining ,on her buttocks. Patient is not cooperative with exam    Review of Systems  Constitutional: Negative for fever and chills.  Respiratory: Negative for cough.    Cardiovascular: Negative for chest pain.  Gastrointestinal: Negative for vomiting and abdominal pain.  Skin: Negative.  Negative for rash.       Abscess on lower back area, draining- as reported by nursing.  Psychiatric/Behavioral: Positive for depression.       (+) psychosis and thought disorder    Blood pressure 120/69, pulse 123, temperature 98.4 F (36.9 C), temperature source Oral, resp. rate 20.There is no weight on file to calculate BMI.  General Appearance: although fairly groomed, she is better groomed than earlier this week  Eye Contact::  improved   Speech:  improving - more verbal and communicative   Volume:  variable   Mood:  less irritable- states " I feels ad sometimes "  Affect: congruent with her mood   Thought Process:  less disorganized  Orientation: appears attentive and fully alert, does not appear delirious or confused   Thought Content:  delusional thoughts, occupied with jewelery and phone   Suicidal Thoughts:  No-  Homicidal Thoughts:  No  Memory:  Recent and remote grossly intact  Judgement:  Impaired  Insight:  Lacking  Psychomotor Activity:  Normal  Concentration:  Fair  Recall:  Good  Fund of Knowledge:Good  Language: Fair  Akathisia:  No  Handed:  Right  AIMS (if indicated):     Assets:  Desire for Improvement Resilience  Sleep:  Number of Hours: 3   Musculoskeletal: Strength & Muscle Tone: within normal limits Gait & Station: normal Patient leans: N/A  Current Medications: Current  Facility-Administered Medications  Medication Dose Route Frequency Provider Last Rate Last Dose  . acetaminophen (TYLENOL) tablet 650 mg  650 mg Oral Q6H PRN Kerry Hough, PA-C   650 mg at 02/17/14 1610  . albuterol (PROVENTIL HFA;VENTOLIN HFA) 108 (90 BASE) MCG/ACT inhaler 2 puff  2 puff Inhalation Q6H PRN Verne Spurr, PA-C   2 puff at 02/17/14 0008  . alum & mag hydroxide-simeth (MAALOX/MYLANTA) 200-200-20 MG/5ML suspension 30 mL  30 mL Oral Q4H PRN  Kerry Hough, PA-C      . haloperidol (HALDOL) tablet 10 mg  10 mg Oral Daily Nehemiah Massed, MD   10 mg at 02/17/14 9604  . haloperidol (HALDOL) tablet 5 mg  5 mg Oral QHS Nehemiah Massed, MD   5 mg at 02/16/14 2130  . hydrOXYzine (ATARAX/VISTARIL) tablet 50 mg  50 mg Oral QHS Shuvon Rankin, NP   50 mg at 02/16/14 2130  . ibuprofen (ADVIL,MOTRIN) tablet 600 mg  600 mg Oral Q6H PRN Jomarie Longs, MD   600 mg at 02/12/14 2226  . LORazepam (ATIVAN) tablet 1 mg  1 mg Oral BID Nehemiah Massed, MD   1 mg at 02/17/14 0743  . LORazepam (ATIVAN) tablet 2 mg  2 mg Oral Q6H PRN Nehemiah Massed, MD   2 mg at 02/17/14 0134  . magnesium hydroxide (MILK OF MAGNESIA) suspension 30 mL  30 mL Oral Daily PRN Shuvon Rankin, NP      . nicotine polacrilex (NICORETTE) gum 2 mg  2 mg Oral PRN Jomarie Longs, MD   2 mg at 02/17/14 0629  . nitrofurantoin (macrocrystal-monohydrate) (MACROBID) capsule 100 mg  100 mg Oral BID Shuvon Rankin, NP   100 mg at 02/17/14 0742  . ondansetron (ZOFRAN-ODT) disintegrating tablet 8 mg  8 mg Oral Q8H PRN Court Joy, PA-C   8 mg at 02/17/14 0007  . sulfamethoxazole-trimethoprim (BACTRIM DS) 800-160 MG per tablet 1 tablet  1 tablet Oral Q12H Jomarie Longs, MD   1 tablet at 02/17/14 0105  . traZODone (DESYREL) tablet 100 mg  100 mg Oral QHS,MR X 1 Kerry Hough, PA-C   100 mg at 02/17/14 0007  . Valproic Acid (DEPAKENE) 250 MG/5ML syrup SYRP 1,500 mg  1,500 mg Oral BID Nehemiah Settle, MD        Lab Results:  Results for orders placed during the hospital encounter of 02/06/14 (from the past 48 hour(s))  VALPROIC ACID LEVEL     Status: None   Collection Time    02/16/14  7:25 PM      Result Value Ref Range   Valproic Acid Lvl 70.7  50.0 - 100.0 ug/mL   Comment: Performed at [facility]  GLUCOSE, CAPILLARY     Status: None   Collection Time    02/17/14  6:41 AM      Result Value Ref Range   Glucose-Capillary 93  70 - 99 mg/dL    Physical Findings: AIMS:  Facial and Oral Movements Muscles of Facial Expression: None, normal Lips and Perioral Area: None, normal Jaw: None, normal Tongue: None, normal,Extremity Movements Upper (arms, wrists, hands, fingers): None, normal Lower (legs, knees, ankles, toes): None, normal, Trunk Movements Neck, shoulders, hips: None, normal, Overall Severity Severity of abnormal movements (highest score from questions above): None, normal Incapacitation due to abnormal movements: None, normal Patient's awareness of abnormal movements (rate only patient's report): No Awareness, Dental Status Current problems with teeth and/or dentures?: No Does patient usually wear dentures?: No  CIWA:  CIWA-Ar Total: 2 COWS:     Assessment-  Patient is improving gradually and partially. Intensity of delusions and of her disorganized thought process and behavior are lessened. 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 She is tolerating Haldol and Depakene well thus far, without Akatisia or dystonias from haldol at this time.  Treatment Plan Summary: Daily contact with patient to assess and evaluate symptoms and progress in treatment Medication management See below   Plan: Continue current inpatient treatment and medication below. Change Depakene to Depakote ER 1000 mg Po Qam and 1500 mg PO Qhs Monitor for VPA level for therapeutic levels in three days Continue Haldol to 10 mgrs QAM and  5 mgrs QHS Continue Ativan 1 mg BID, + PRNS if needed for agitation  Medical Decision Making Problem Points:  Established problem, stable/improving (1), Review of last therapy session (1) and Review of psycho-social stressors (1) Data Points:  Review of medication regiment & side effects (2) Review of new medications or change in dosage (2)  I certify that inpatient services furnished can reasonably be expected to improve the patient's  condition.   Nehemiah Settle MD 02/17/2014, 12:31 PM

## 2014-02-18 LAB — GLUCOSE, CAPILLARY: Glucose-Capillary: 95 mg/dL (ref 70–99)

## 2014-02-18 MED ORDER — VALPROIC ACID 250 MG/5ML PO SYRP
1000.0000 mg | ORAL_SOLUTION | Freq: Two times a day (BID) | ORAL | Status: DC
  Administered 2014-02-18 – 2014-02-20 (×4): 1000 mg via ORAL
  Filled 2014-02-18 (×8): qty 20

## 2014-02-18 NOTE — [facility] Group Notes (Signed)
[facility] [name] Group Therapy  02/18/2014 2:01 PM  Type of Therapy:  Group Therapy  Participation Level:  Minimal  Participation Quality:  Attentive  Affect:  Depressed  Cognitive:  Alert and Oriented  Insight:  Improving  Engagement in Therapy:  Improving  Modes of Intervention:  Discussion, Limit-setting, Socialization and Support  Summary of Progress/Problems: Today's Topic: Overcoming Obstacles. Pt identified obstacles faced currently and processed barriers involved in overcoming these obstacles. Pt identified steps necessary for overcoming these obstacles and explored motivation (internal and external) for facing these difficulties head on. Pt further identified one area of concern in their lives and chose a skill of focus pulled from their "toolbox." Molly [name] was attentive during today's processing group. She stated that she did not overcome anything in her life and is feeling "stuck." Molly [name] stated that she feels there is hope for her future and cited her children as her primary motivation. Molly [name] shows progress in the group setting and improving insight AEB her ability to process how "patience" is important and identified this as an obstacle.    Smart, Molly [name] LCSWA 02/18/2014, 2:01 PM

## 2014-02-18 NOTE — Plan of Care (Signed)
Problem: Ineffective individual coping Goal: STG: Pt will be able to identify effective and ineffective STG: Pt will be able to identify effective and ineffective coping patterns  Outcome: Not Progressing Patient continues to demand things intrusively when upset rather than calmly request things of staff.   Problem: Diagnosis: Increased Risk For Suicide Attempt Goal: STG-Patient Will Report Suicidal Feelings to Staff Outcome: Progressing Patient denies SI and has not engaged in self harm.

## 2014-02-18 NOTE — Progress Notes (Signed)
Patient increasingly agitated this morning demanding discharge. Pt continues to act out when she doesn't hear the answer she wants. Took medications this morning however vomited within 20 mins. States she has been vomiting with every meal and does this when at home as well. "I must be pregnant." Dr. Elna BreslowEappen informed of above. Pt slamming doors, cursing loudly after being told she would not be leaving today. On phone calling 911 telling them she is being kept against her will. Phone privileges will be suspended at this time. Plan is to medicate with zofran prior to med admin and continue frequent redirect. Pt denies SI/HI/AVH and remains safe under close monitoring. Lawrence MarseillesFriedman, Hiroshi Krummel Eakes

## 2014-02-18 NOTE — Progress Notes (Signed)
D: Pt presents irritable in affect and labile in mood. Pt was intrusive and verbally aggressive this evening. Pt refused to take her Depakote Po. She reports having trouble keeping down this pill. Pt demanded her Valproic acid. Pt was informed that the Valproic Acid order was d/c due to her decreased Depakote levels. Pt was not receptive to the information. Pt verbalized that this writer deleted the order. Pt is currently denying any SI/HI/AVH.  A: Writer received a one time order for pt to take 1,500 mg of Valproic Acid instead of Depakote this evening. Pt took other scheduled medications without any refusal. Continued support and avialabity as needed was extended to this pt. Staff continue to monitor pt with q6015min checks.  R: No adverse drug reactions noted. Pt receptive to treatment. Pt remains safe at this time.

## 2014-02-18 NOTE — [facility] Group Notes (Signed)
[name]   02/18/2014 11:23 AM  Participation Quality:  Molly BumpsJessica greeted me in the hall this AM telling me she is ready to be discharge.  When asked where she was planning to go, she stated [facility].  I explained that there is a process that we need to go through to get her there.  She became increasingly agitated and angry, demanding to be discharged today.  "I don't care where you send me, I am taking my meds and I am stable."  Observed hitting the wall by the phone in the hallway.  Later came to me and apologized.  Asked to be with me when I call [facility].    Daryel GeraldNorth, Obert Espindola B

## 2014-02-18 NOTE — Progress Notes (Signed)
Patient ID: Molly [name], female   DOB: [birth year], 28 y.o.   MRN: [id number] PER STATE REGULATIONS 482.30  THIS CHART WAS REVIEWED FOR MEDICAL NECESSITY WITH RESPECT TO THE PATIENT'S ADMISSION/ DURATION OF STAY.  NEXT REVIEW DATE: 02/21/2014  Willa RoughJENNIFER JONES Andilyn Bettcher, RN, BSN CASE MANAGER

## 2014-02-18 NOTE — Progress Notes (Signed)
Patient ID: Molly [name], female   DOB: [birth year], 28 y.o.   MRN: [id number]  [facility]  02/18/2014 2:31 PM Molly [name]  MRN:  [id number]   Subjective:  Patient reports she is "OK". She states she does not like the " medication they are giving me" ( referring to Depakote). She states she wants to be discharged soon, and she focuses on an upcoming court date.  Objective: Some gradual but ongoing improvement compared to her initial presentation. Her grooming is better. Her affect is less blunted and less irritable. Her thought process  Becomes easily disorganized , but in general she seems more linear and more focused on real life stressors, such as upcoming court date. Delusions have decreased, but continues to makes bizarre statements such as that her Girlfriend "uses an alias and is an Chief Technology Officerangel and a devil" and that she had  felt " something was sliding down my birth canal". She is no longer as focused on pregnancy preoccupations, and states  I thought I could be pregnant" based on prior experience of being pregnant in spite of a negative pregnancy test. Her insight remains limited, and she has difficulty understanding the importance of ongoing psychiatric medication management, although she does state she will take medications as prescribed. She is refusing, however, to be started on an IM decanoate/depot antipsychotic. She is tolerating Valproic Acid and Haloperidol well, without akahisia or noticeable EPS. Valproic Acid Level 70.7 As per nursing report patient has been suboptimally compliant with Depakote, which was started over the weekend, replacing Depakene , which she was taking .    Diagnosis:   Primary Psychiatric Diagnosis: Schizophrenia ,multiple episodes, currently in acute episode versus Schizoaffective disorder,bipolar type  Total Time spent with patient: 20 minutes  ADL's: fair , but improving   Sleep: fair  Appetite:  Good   Psychiatric Specialty  Exam: Physical Exam  Skin:  Patient per nursing staff has a large purulent abcess vs boil ,which is draining ,on her buttocks. Patient is not cooperative with exam    Review of Systems  Constitutional: Negative for fever and chills.  Respiratory: Negative for cough.   Cardiovascular: Negative for chest pain.  Gastrointestinal: Negative for vomiting and abdominal pain.  Skin: Negative.  Negative for rash.       Abscess on lower back area, draining- as reported by nursing.  Psychiatric/Behavioral: Positive for depression.       (+) psychosis and thought disorder    Blood pressure 134/70, pulse 82, temperature 98.3 F (36.8 C), temperature source Oral, resp. rate 16.There is no weight on file to calculate BMI.  General Appearance: grooming improved,   Eye Contact::  improved   Speech:  improving - more verbal and communicative   Volume:  variable   Mood:  "OK" denies depression, remains slightly irritable, and today somewhat flirtatious  Affect:  Less irritable   Thought Process:  less disorganized  Orientation: appears attentive and fully alert, does not appear delirious or confused   Thought Content:  Less focused and less preoccupied with psychotic/delusional material.   Suicidal Thoughts:  No- denies any thoughts of hurting self or anyone else   Homicidal Thoughts:  No  Memory:  Recent and remote grossly intact  Judgement:  Impaired  Insight:  Lacking  Psychomotor Activity:  Normal  Concentration:  Fair  Recall:  Good  Fund of Knowledge:Good  Language: Fair  Akathisia:  No  Handed:  Right  AIMS (if indicated):  Assets:  Desire for Improvement Resilience  Sleep:  Number of Hours: 5   Musculoskeletal: Strength & Muscle Tone: within normal limits Gait & Station: normal Patient leans: N/A  Current Medications: Current Facility-Administered Medications  Medication Dose Route Frequency Provider Last Rate Last Dose  . acetaminophen (TYLENOL) tablet 650 mg  650 mg Oral  Q6H PRN Kerry Hough, PA-C   650 mg at 02/17/14 2350  . albuterol (PROVENTIL HFA;VENTOLIN HFA) 108 (90 BASE) MCG/ACT inhaler 2 puff  2 puff Inhalation Q6H PRN Verne Spurr, PA-C   2 puff at 02/17/14 0008  . alum & mag hydroxide-simeth (MAALOX/MYLANTA) 200-200-20 MG/5ML suspension 30 mL  30 mL Oral Q4H PRN Kerry Hough, PA-C      . divalproex (DEPAKOTE ER) 24 hr tablet 1,000 mg  1,000 mg Oral Daily Nehemiah Settle, MD   1,000 mg at 02/18/14 2130   And  . divalproex (DEPAKOTE ER) 24 hr tablet 1,500 mg  1,500 mg Oral QHS Nehemiah Settle, MD      . haloperidol (HALDOL) tablet 10 mg  10 mg Oral Daily Nehemiah Massed, MD   10 mg at 02/18/14 0802  . haloperidol (HALDOL) tablet 5 mg  5 mg Oral QHS Nehemiah Massed, MD   5 mg at 02/17/14 2147  . hydrOXYzine (ATARAX/VISTARIL) tablet 50 mg  50 mg Oral QHS Shuvon Rankin, NP   50 mg at 02/17/14 2147  . ibuprofen (ADVIL,MOTRIN) tablet 600 mg  600 mg Oral Q6H PRN Jomarie Longs, MD   600 mg at 02/12/14 2226  . LORazepam (ATIVAN) tablet 1 mg  1 mg Oral BID Nehemiah Massed, MD   1 mg at 02/18/14 0802  . LORazepam (ATIVAN) tablet 2 mg  2 mg Oral Q6H PRN Nehemiah Massed, MD   2 mg at 02/18/14 0802  . magnesium hydroxide (MILK OF MAGNESIA) suspension 30 mL  30 mL Oral Daily PRN Shuvon Rankin, NP      . nicotine polacrilex (NICORETTE) gum 2 mg  2 mg Oral PRN Jomarie Longs, MD   2 mg at 02/18/14 1106  . nitrofurantoin (macrocrystal-monohydrate) (MACROBID) capsule 100 mg  100 mg Oral BID Shuvon Rankin, NP   100 mg at 02/18/14 0802  . ondansetron (ZOFRAN-ODT) disintegrating tablet 8 mg  8 mg Oral Q8H PRN Court Joy, PA-C   8 mg at 02/17/14 2011  . sulfamethoxazole-trimethoprim (BACTRIM DS) 800-160 MG per tablet 1 tablet  1 tablet Oral Q12H Jomarie Longs, MD   1 tablet at 02/18/14 0802  . traZODone (DESYREL) tablet 100 mg  100 mg Oral QHS,MR X 1 Kerry Hough, PA-C   100 mg at 02/18/14 0030    Lab Results:  Results for orders placed during  the hospital encounter of 02/06/14 (from the past 48 hour(s))  VALPROIC ACID LEVEL     Status: None   Collection Time    02/16/14  7:25 PM      Result Value Ref Range   Valproic Acid Lvl 70.7  50.0 - 100.0 ug/mL   Comment: Performed at [facility]  GLUCOSE, CAPILLARY     Status: None   Collection Time    02/17/14  6:41 AM      Result Value Ref Range   Glucose-Capillary 93  70 - 99 mg/dL  GLUCOSE, CAPILLARY     Status: None   Collection Time    02/18/14  5:43 AM      Result Value Ref Range   Glucose-Capillary 95  70 - 99 mg/dL    Physical Findings: AIMS: Facial and Oral Movements Muscles of Facial Expression: None, normal Lips and Perioral Area: None, normal Jaw: None, normal Tongue: None, normal,Extremity Movements Upper (arms, wrists, hands, fingers): None, normal Lower (legs, knees, ankles, toes): None, normal, Trunk Movements Neck, shoulders, hips: None, normal, Overall Severity Severity of abnormal movements (highest score from questions above): None, normal Incapacitation due to abnormal movements: None, normal Patient's awareness of abnormal movements (rate only patient's report): No Awareness, Dental Status Current problems with teeth and/or dentures?: No Does patient usually wear dentures?: No  CIWA:  CIWA-Ar Total: 2 COWS:     Assessment-  Patient is slowly improving, although remains fragile and still disorganized at times. She is starting to focus more on discharge soon. I have discussed case with Child psychotherapistocial Worker, and he is working on Engineer, manufacturingdisposition plan that includes ACT  Team follow up  and placement   Will continue Haldol and change back to Depakene as patient more apt to comply with the latter.   Treatment Plan Summary: Daily contact with patient to assess and evaluate symptoms and progress in treatment Medication management See below   Plan: Continue current inpatient treatment and medication below. Change  Back to Depakene ( for improved compliance )  - 1000 mgrs BID Continue Haldol to 10 mgrs QAM and  5 mgrs QHS Continue Ativan 1 mg BID, + PRNS if needed for agitation Social Worker/Staff working on appropriate disposition plan.  Medical Decision Making Problem Points:  Established problem, stable/improving (1), Review of last therapy session (1) and Review of psycho-social stressors (1) Data Points:  Review of medication regiment & side effects (2) Review of new medications or change in dosage (2)  I certify that inpatient services furnished can reasonably be expected to improve the patient's condition.   Nehemiah MassedOBOS, FERNANDO MD 02/18/2014, 2:31 PM

## 2014-02-19 LAB — GLUCOSE, CAPILLARY: GLUCOSE-CAPILLARY: 110 mg/dL — AB (ref 70–99)

## 2014-02-19 NOTE — Progress Notes (Signed)
D) [facility] and has asked to be d/c.  Pt. Denies pain and currently reports no thoughts of SI/HI.  Pt. Has expressed her desire to leave to RN and MD staff.  Pt. Also expressed concern that she has a court appearance that she has missed due to being hospitalized.  Pt. Has made attempt to contact her PSR Agent.  A) Support offered.  PSR Agent information given to SW.  Pt. Offered PRN medication  for agitation as needed, but pt. Refused medication at this time.  R) Pt. Continues to focus on d/c. And remains safe at this time.

## 2014-02-19 NOTE — [facility] Group Notes (Signed)
The focus of this group is to educate the patient on the purpose and policies of crisis stabilization and provide a format to answer questions about their admission.  The group details unit policies and expectations of patients while admitted. Patient attended this group but did not engage.

## 2014-02-19 NOTE — [facility] Group Notes (Signed)
[facility] [name] Group Therapy  02/19/2014 11:32 AM  Type of Therapy:  Group Therapy  Participation Level:  None  Participation Quality:  Attentive  Affect:  Irritable  Cognitive:  Alert  Insight:  Poor  Engagement in Therapy:  None  Modes of Intervention:  Discussion, Education, Exploration, Limit-setting, Problem-solving, Socialization and Support  Summary of Progress/Problems:  Feelings around Diagnosis--patients were asked to talk about what diagnosis means to them, process if and why it is important to know their mental health diagnosis, and discuss pros and cons of having a mental health diagnosis. Molly [name] was attentive but did not actively engaged in group discussion. She remained alert throughout duration of group. At this time, Molly [name] continues to demonstrate limited insight with some progress in the group setting AEB her ability to remain in group for its entirety.   Smart, Molly [name] LCSWA 02/19/2014, 11:32 AM

## 2014-02-19 NOTE — Progress Notes (Signed)
Patient ID: Molly [name], female   DOB: Nov 27, 1985, 27 y.o.   MRN: [id number]  [facility]  02/19/2014 4:42 PM Molly FreshJessica S [name]  MRN:  [id number]   Subjective:  Patient reports she is feeling " about the same". She states " when am I going to get out of here"  Objective: As  Per notes and presentation, patient has improved gradually but significantly compared to initial presentation. She is still intermittently irritable, and insight is limited, but overall she is significantly calmer, better related, better groomed, and less focused on delusional, psychotic material, and more focused on actual disposition planning issues. She has been compliant with medications with encouragement from staff  . At this time does not endorse medication  Side effects. No akatisia or acute dystonia.  I have discussed case with Child psychotherapistocial Worker, who is working on appropriate disposition planning to include ACT team involvement and support. I have again encouraged patient to consider IM Haldol decanoate, based lon her history of poor medication compliance, but not agreeing this at the present time. She does state she will take the PO  Medications as prescribed. Fingerstick today 110    Diagnosis:   Primary Psychiatric Diagnosis: Schizophrenia ,multiple episodes, currently in acute episode versus Schizoaffective disorder,bipolar type  Total Time spent with patient: 20 minutes  ADL's: fair , but improving   Sleep: fair  Appetite:  Good   Psychiatric Specialty Exam: Physical Exam  Skin:  Patient per nursing staff has a large purulent abcess vs boil ,which is draining ,on her buttocks. Patient is not cooperative with exam    Review of Systems  Constitutional: Negative for fever and chills.  Respiratory: Negative for cough.   Cardiovascular: Negative for chest pain.  Gastrointestinal: Negative for vomiting and abdominal pain.  Skin: Negative.  Negative for rash.       Abscess on lower back area,  draining- as reported by nursing.  Psychiatric/Behavioral: Positive for depression.       (+) psychosis and thought disorder    Blood pressure 110/67, pulse 102, temperature 98.4 F (36.9 C), temperature source Oral, resp. rate 20, height 5' 2.5" (1.588 m), weight 53.071 kg (117 lb).Body mass index is 21.05 kg/(m^2).  General Appearance: grooming improved,   Eye Contact::  improved   Speech:  improving - more verbal and communicative   Volume:  Normal  Mood:  denies feeling sad or depressed. States " I will be happy when I am discharged"  Affect:  Less irritable   Thought Process:  less disorganized and less ruminative, more focused on future oriented issues, such as disposition planning   Orientation: appears attentive and fully alert, does not appear delirious or confused   Thought Content:  Less focused and less preoccupied with psychotic/delusional material.  Today did not make any delusional statements  Suicidal Thoughts:  No- denies any thoughts of hurting self or anyone else   Homicidal Thoughts:  No  Memory:  Recent and remote grossly intact  Judgement:  Impaired  Insight:  Lacking  Psychomotor Activity:  Normal  Concentration:  Fair  Recall:  Good  Fund of Knowledge:Good  Language: Fair  Akathisia:  No  Handed:  Right  AIMS (if indicated):     Assets:  Desire for Improvement Resilience  Sleep:  Number of Hours: 4.5   Musculoskeletal: Strength & Muscle Tone: within normal limits Gait & Station: normal Patient leans: N/A  Current Medications: Current Facility-Administered Medications  Medication Dose Route Frequency Provider Last  Rate Last Dose  . acetaminophen (TYLENOL) tablet 650 mg  650 mg Oral Q6H PRN Kerry Hough, PA-C   650 mg at 02/17/14 2350  . albuterol (PROVENTIL HFA;VENTOLIN HFA) 108 (90 BASE) MCG/ACT inhaler 2 puff  2 puff Inhalation Q6H PRN Verne Spurr, PA-C   2 puff at 02/19/14 1557  . alum & mag hydroxide-simeth (MAALOX/MYLANTA) 200-200-20 MG/5ML  suspension 30 mL  30 mL Oral Q4H PRN Kerry Hough, PA-C      . haloperidol (HALDOL) tablet 10 mg  10 mg Oral Daily Nehemiah Massed, MD   10 mg at 02/19/14 0830  . haloperidol (HALDOL) tablet 5 mg  5 mg Oral QHS Nehemiah Massed, MD   5 mg at 02/18/14 2131  . hydrOXYzine (ATARAX/VISTARIL) tablet 50 mg  50 mg Oral QHS Shuvon Rankin, NP   50 mg at 02/18/14 2133  . ibuprofen (ADVIL,MOTRIN) tablet 600 mg  600 mg Oral Q6H PRN Jomarie Longs, MD   600 mg at 02/12/14 2226  . LORazepam (ATIVAN) tablet 1 mg  1 mg Oral BID Nehemiah Massed, MD   1 mg at 02/19/14 0830  . LORazepam (ATIVAN) tablet 2 mg  2 mg Oral Q6H PRN Nehemiah Massed, MD   2 mg at 02/18/14 2132  . magnesium hydroxide (MILK OF MAGNESIA) suspension 30 mL  30 mL Oral Daily PRN Shuvon Rankin, NP      . nicotine polacrilex (NICORETTE) gum 2 mg  2 mg Oral PRN Jomarie Longs, MD   2 mg at 02/19/14 1558  . nitrofurantoin (macrocrystal-monohydrate) (MACROBID) capsule 100 mg  100 mg Oral BID Shuvon Rankin, NP   100 mg at 02/19/14 0907  . ondansetron (ZOFRAN-ODT) disintegrating tablet 8 mg  8 mg Oral Q8H PRN Court Joy, PA-C   8 mg at 02/18/14 1640  . sulfamethoxazole-trimethoprim (BACTRIM DS) 800-160 MG per tablet 1 tablet  1 tablet Oral Q12H Jomarie Longs, MD   1 tablet at 02/19/14 0830  . traZODone (DESYREL) tablet 100 mg  100 mg Oral QHS,MR X 1 Kerry Hough, PA-C   100 mg at 02/18/14 2131  . Valproic Acid (DEPAKENE) 250 MG/5ML syrup SYRP 1,000 mg  1,000 mg Oral BID Nehemiah Massed, MD   1,000 mg at 02/19/14 1610    Lab Results:  Results for orders placed during the hospital encounter of 02/06/14 (from the past 48 hour(s))  GLUCOSE, CAPILLARY     Status: None   Collection Time    02/18/14  5:43 AM      Result Value Ref Range   Glucose-Capillary 95  70 - 99 mg/dL  GLUCOSE, CAPILLARY     Status: Abnormal   Collection Time    02/19/14  6:35 AM      Result Value Ref Range   Glucose-Capillary 110 (*) 70 - 99 mg/dL    Physical  Findings: AIMS: Facial and Oral Movements Muscles of Facial Expression: None, normal Lips and Perioral Area: None, normal Jaw: None, normal Tongue: None, normal,Extremity Movements Upper (arms, wrists, hands, fingers): None, normal Lower (legs, knees, ankles, toes): None, normal, Trunk Movements Neck, shoulders, hips: None, normal, Overall Severity Severity of abnormal movements (highest score from questions above): None, normal Incapacitation due to abnormal movements: None, normal Patient's awareness of abnormal movements (rate only patient's report): No Awareness, Dental Status Current problems with teeth and/or dentures?: No Does patient usually wear dentures?: No  CIWA:  CIWA-Ar Total: 2 COWS:     Assessment-  Patient has continued to improve  gradually, and is now less disorganized, less irritable, less focused on psychotic ideations, and overall better related and more interactive.   Treatment Plan Summary: Daily contact with patient to assess and evaluate symptoms and progress in treatment Medication management See below   Plan: Continue current inpatient treatment and medication below. Treatment team working on disposition planning- consider discharge soon as she continues to stabilize. Depakene 1000 mgrs BID Haldol  10 mgrs QAM and  5 mgrs QHS Ativan 1 mg BID   Medical Decision Making Problem Points:  Established problem, stable/improving (1), Review of last therapy session (1) and Review of psycho-social stressors (1) Data Points:  Review of medication regiment & side effects (2)  I certify that inpatient services furnished can reasonably be expected to improve the patient's condition.   Nehemiah MassedOBOS, FERNANDO MD 02/19/2014, 4:42 PM

## 2014-02-19 NOTE — Progress Notes (Signed)
D: Pt denies SI/HI/AVH. Pt is pleasant and cooperative. Pt appears to present in a better mood, but pt continues to be intrusive, demanding and irritable. Pt continues to demand things, but limit setting is the only to keep her under control, most of the time. Pt stated she [facility] be [facility] when [facility].   A: Pt was offered support and encouragement. Pt was given scheduled medications. Pt was encourage to attend groups. Q 15 minute checks were done for safety.   R:Pt attends groups and interacts well with peers and staff. Pt is taking medication. Pt receptive to treatment and safety maintained on unit.

## 2014-02-20 LAB — GLUCOSE, CAPILLARY: GLUCOSE-CAPILLARY: 82 mg/dL (ref 70–99)

## 2014-02-20 MED ORDER — HALOPERIDOL 5 MG PO TABS: 5.0000 mg | ORAL_TABLET | Freq: Every day | ORAL | Status: DC

## 2014-02-20 MED ORDER — HALOPERIDOL 10 MG PO TABS: 10.0000 mg | ORAL_TABLET | Freq: Every day | ORAL | Status: DC

## 2014-02-20 MED ORDER — HYDROXYZINE HCL 50 MG PO TABS: 50.0000 mg | ORAL_TABLET | Freq: Every day | ORAL | Status: DC

## 2014-02-20 MED ORDER — DIVALPROEX SODIUM ER 500 MG PO TB24: 1000.0000 mg | ORAL_TABLET | Freq: Two times a day (BID) | ORAL | Status: DC

## 2014-02-20 MED ORDER — PRENATAL MULTIVITAMIN CH: 1.0000 | ORAL_TABLET | Freq: Every day | ORAL | Status: DC

## 2014-02-20 MED ORDER — LORAZEPAM 1 MG PO TABS: 1.0000 mg | ORAL_TABLET | Freq: Two times a day (BID) | ORAL | Status: DC

## 2014-02-20 MED ORDER — NITROFURANTOIN MONOHYD MACRO 100 MG PO CAPS: 100.0000 mg | ORAL_CAPSULE | Freq: Two times a day (BID) | ORAL | Status: DC

## 2014-02-20 MED ORDER — DIVALPROEX SODIUM ER 500 MG PO TB24
1000.0000 mg | ORAL_TABLET | Freq: Two times a day (BID) | ORAL | Status: DC
  Filled 2014-02-20 (×2): qty 12

## 2014-02-20 MED ORDER — TRAZODONE HCL 100 MG PO TABS: 100.0000 mg | ORAL_TABLET | Freq: Every evening | ORAL | Status: DC | PRN
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
## 2014-02-20 NOTE — Progress Notes (Signed)
Patient ID: Levester FreshJessica S Bacigalupi, female   DOB: 1985/12/09, 28 y.o.   MRN: [id number]  Pt. Denies SI/HI and A/V hallucinations to Clinical research associatewriter. Belongings returned to patient at time of discharge. Patient denies any pain or discomfort. Discharge instructions and medications were reviewed with patient. Patient verbalized understanding of both medications and discharge instructions. Q15 minute safety checks maintained until discharge. Patient originally had bus voucher and money for [employer] bus. However, patient refused to leave and called the [employer] to get an "escort" because her bags are too heavy to carry. Writer spoke with patient and told her that she had already received means of travel and [employer] would not be called by hospital therefore she called [employer] herself. Writer spoke with A/C who gave now discharged patient a cab voucher. When writer returned to now discharged patient to give cab voucher she was smoking a cigarette. Writer explained to discharged patient that [facility] is a Tobacco free hospital network and she would need to extinguish cigarette and not smoke until 25 feet away from campus. She verbalized understanding.

## 2014-02-20 NOTE — [facility] Suicide Risk Assessment (Signed)
Demographic Factors:  28 year old female , single, has four children being taken care of by extended family, homeless   Total Time spent with patient: 30 minutes  Psychiatric Specialty Exam: Physical Exam  ROS  Blood pressure 110/67, pulse 102, temperature 98.4 F (36.9 C), temperature source Oral, resp. rate 20, height 5' 2.5" (1.588 m), weight 53.071 kg (117 lb).Body mass index is 21.05 kg/(m^2).  General Appearance: grooming has improved   Eye Contact::  improved eye contact   Speech:  Normal Rate  Volume:  variable   Mood:  acknowledges her mood is better, and affect is more reactive, smiles often appropriately. Does remain labile, and tends to become irritable easily  Affect:  improved range of affect, still some irritabillity  Thought Process:  less disorganized, more focused and linear, currently focused on dischdarge issues  Orientation:  Other:  fully alert and attentive  Thought Content:  denies hallucinations, does not appear internally preoccupied, does not express any delusions at this time  Suicidal Thoughts:  No- denies any thoughts of hurting self or anyone else   Homicidal Thoughts:  No  Memory:  recent and remote grossly intact   Judgement:  Fair  Insight:  Fair  Psychomotor Activity:  Normal  Concentration:  Good  Recall:  Good  Fund of Knowledge:Good  Language: Good  Akathisia:  No  Handed:  Right  AIMS (if indicated):     Assets:  Desire for Improvement Physical Health Resilience  Sleep:  Number of Hours: 4.5    Musculoskeletal: Strength & Muscle Tone: within normal limits- does not present with any akathisia, dystonia, and no abnormal involuntary movements are noted at this time Gait & Station: normal Patient leans: N/A   Mental Status Per Nursing Assessment::   On Admission:  NA  Current Mental Status by Physician: Compared to admission there has been significant improvement - she presents  Better groomed, better related overall, better eye  contact, more communicative, less focused and preoccupied on delusional material and today not expressing any delusions, and more focused on disposition planning. Not suicidal or homicidal, denying hallucination.  Loss Factors: Homelessness, unemployment, severe mental illness   Historical Factors: Prior diagnosis of schizophrenia, no history of self injurious behaviors, no history of suicide attempts as per patient  Risk Reduction Factors:   Sense of responsibility to family and Positive coping skills or problem solving skills  Continued Clinical Symptoms:  As above, improved compared to admission - patient does remain somewhat guarded and labile at times, but behavior has  Been in good control.   Cognitive Features That Contribute To Risk:  Limited insight into illness, with history of suboptimal compliance with medications in the past.  Suicide Risk:  Mild:  Suicidal ideation of limited frequency, intensity, duration, and specificity.  There are no identifiable plans, no associated intent, mild dysphoria and related symptoms, good self-control (both objective and subjective assessment), few other risk factors, and identifiable protective factors, including available and accessible social support.  Discharge Diagnoses:   AXIS I:  Schizoaffective Disorder AXIS II:  Deferred AXIS III:   Past Medical History  Diagnosis Date  . Schizophrenia   . CERVICITIS, GONOCOCCAL, History of 01/09/2007    Qualifier: History of  By: McDiarmid MD, Tawanna Coolerodd    . ATTENTION DEFICIT, W/O HYPERACTIVITY, History of 06/30/2006    Qualifier: History of  By: McDiarmid MD, Tawanna Coolerodd    . Depression   . SCHIZOPHRENIA, CATATONIC, HISTORY OF 12/13/2006    Annotation: Diagnoses  by  Dr. Dennie Bibleichard Larsen (Psych) At [facility] [place], [place]. Qualifier: Hospitalized for  By: McDiarmid MD, Tawanna Coolerodd    . 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 TOBACCO USER  02/08/2009    Qualifier: Diagnosis of  By: Knox Royaltydell, Erin    . 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 ASC-cannot exclude HGSIL on Pap 02/15/2012    ASC-US on 02/03/2012 pap (associated Trichomonas infection). No reflex HPV testing performed on specimen.  Patient informed that she will need repeat Pap in one year.      . Diabetes mellitus     diet controlled  . Eczema   . Abnormal Pap smear    AXIS IV: homelessness , chronic mental illness, disability   AXIS V:  55- 60 upon discharge   Plan Of Care/Follow-up recommendations:  Activity:  As tolerated Diet:  Diabetic Diet Tests:  NA Other:  See below  Is patient on multiple antipsychotic therapies at discharge:  No   Has Patient had three or more failed trials of antipsychotic monotherapy by history:  No  Recommended Plan for Multiple Antipsychotic Therapies: NA  Patient is followed by [facility]. She states she has an intake appointment to live at [facility] [place]. She states that otherwise she will stay with family or in a hotel until she is accepted there.  Rangel Echeverri 02/20/2014, 10:52 AM

## 2014-02-20 NOTE — Progress Notes (Signed)
Patient ID: [name], female   DOB: 1985-10-10, 28 y.o.   MRN: [id number]  D: Pt slept thru the earlier part of the shift. After waking pt received scheduled hs meds. However, pt returned informing the writer that she could sleep. Pt sat on the floor in the hall, and then lay down. Writer offered pt prn meds in an attempt to help her rest.  A:  Support and encouragement was offered. 15 min checks continued for safety.  R: Pt remains safe.

## 2014-02-22 NOTE — Progress Notes (Addendum)
Patient Discharge Instructions:  After Visit Summary (AVS):   Faxed to:  02/22/14 Discharge Summary Note:   Faxed to:  02/22/14 Psychiatric Admission Assessment Note:   Faxed to:  02/22/14 Suicide Risk Assessment - Discharge Assessment:   Faxed to:  02/22/14 Faxed/Sent to the Next Level Care provider:  02/22/14 Next Level Care Provider Has Access to the EMR, 02/22/14 Faxed to [facility] @ [phone] Records provided to [facility] via CHL/Epic access.  Jerelene ReddenSheena E Cook, 02/22/2014, 3:42 PM

## 2014-02-26 DIAGNOSIS — F209 Schizophrenia, unspecified: Secondary | ICD-10-CM | POA: Diagnosis not present

## 2014-03-04 ENCOUNTER — Encounter (HOSPITAL_COMMUNITY): Payer: Self-pay | Admitting: *Deleted

## 2014-03-20 ENCOUNTER — Emergency Department (HOSPITAL_COMMUNITY)
Admission: EM | Admit: 2014-03-20 | Discharge: 2014-03-21 | Disposition: A | Discharge status: Home / Self Care | Payer: Medicare Other | Attending: Emergency Medicine | Admitting: Emergency Medicine

## 2014-03-20 ENCOUNTER — Encounter (HOSPITAL_COMMUNITY): Payer: Self-pay | Admitting: Emergency Medicine

## 2014-03-20 DIAGNOSIS — Y998 Other external cause status: Secondary | ICD-10-CM | POA: Diagnosis not present

## 2014-03-20 DIAGNOSIS — I6789 Other cerebrovascular disease: Secondary | ICD-10-CM | POA: Diagnosis not present

## 2014-03-20 DIAGNOSIS — K612 Anorectal abscess: Secondary | ICD-10-CM

## 2014-03-20 DIAGNOSIS — T426X1A Poisoning by other antiepileptic and sedative-hypnotic drugs, accidental (unintentional), initial encounter: Secondary | ICD-10-CM | POA: Insufficient documentation

## 2014-03-20 DIAGNOSIS — T50904A Poisoning by unspecified drugs, medicaments and biological substances, undetermined, initial encounter: Secondary | ICD-10-CM | POA: Diagnosis not present

## 2014-03-20 DIAGNOSIS — T43211A Poisoning by selective serotonin and norepinephrine reuptake inhibitors, accidental (unintentional), initial encounter: Secondary | ICD-10-CM | POA: Insufficient documentation

## 2014-03-20 DIAGNOSIS — K610 Anal abscess: Secondary | ICD-10-CM | POA: Diagnosis not present

## 2014-03-20 DIAGNOSIS — T50901A Poisoning by unspecified drugs, medicaments and biological substances, accidental (unintentional), initial encounter: Secondary | ICD-10-CM

## 2014-03-20 DIAGNOSIS — Z3202 Encounter for pregnancy test, result negative: Secondary | ICD-10-CM | POA: Diagnosis not present

## 2014-03-20 DIAGNOSIS — Z79899 Other long term (current) drug therapy: Secondary | ICD-10-CM | POA: Insufficient documentation

## 2014-03-20 DIAGNOSIS — T424X1A Poisoning by benzodiazepines, accidental (unintentional), initial encounter: Secondary | ICD-10-CM | POA: Insufficient documentation

## 2014-03-20 DIAGNOSIS — D72829 Elevated white blood cell count, unspecified: Secondary | ICD-10-CM | POA: Diagnosis not present

## 2014-03-20 DIAGNOSIS — Z8619 Personal history of other infectious and parasitic diseases: Secondary | ICD-10-CM | POA: Diagnosis not present

## 2014-03-20 DIAGNOSIS — Z720 Tobacco use: Secondary | ICD-10-CM | POA: Insufficient documentation

## 2014-03-20 DIAGNOSIS — E119 Type 2 diabetes mellitus without complications: Secondary | ICD-10-CM | POA: Insufficient documentation

## 2014-03-20 DIAGNOSIS — F4489 Other dissociative and conversion disorders: Secondary | ICD-10-CM | POA: Diagnosis not present

## 2014-03-20 DIAGNOSIS — Z872 Personal history of diseases of the skin and subcutaneous tissue: Secondary | ICD-10-CM | POA: Diagnosis not present

## 2014-03-20 DIAGNOSIS — L0231 Cutaneous abscess of buttock: Secondary | ICD-10-CM | POA: Diagnosis not present

## 2014-03-20 DIAGNOSIS — Y9389 Activity, other specified: Secondary | ICD-10-CM | POA: Insufficient documentation

## 2014-03-20 DIAGNOSIS — F329 Major depressive disorder, single episode, unspecified: Secondary | ICD-10-CM | POA: Diagnosis not present

## 2014-03-20 DIAGNOSIS — Y9289 Other specified places as the place of occurrence of the external cause: Secondary | ICD-10-CM | POA: Diagnosis not present

## 2014-03-20 DIAGNOSIS — K611 Rectal abscess: Secondary | ICD-10-CM | POA: Diagnosis not present

## 2014-03-20 DIAGNOSIS — T43591A Poisoning by other antipsychotics and neuroleptics, accidental (unintentional), initial encounter: Secondary | ICD-10-CM | POA: Diagnosis not present

## 2014-03-20 LAB — COMPREHENSIVE METABOLIC PANEL
ALBUMIN: 2.9 g/dL — AB (ref 3.5–5.2)
ALK PHOS: 71 U/L (ref 39–117)
ALT: 7 U/L (ref 0–35)
AST: 10 U/L (ref 0–37)
Anion gap: 8 (ref 5–15)
BUN: 7 mg/dL (ref 6–23)
CHLORIDE: 99 meq/L (ref 96–112)
CO2: 28 mEq/L (ref 19–32)
Calcium: 9.1 mg/dL (ref 8.4–10.5)
Creatinine, Ser: 0.78 mg/dL (ref 0.50–1.10)
GFR calc Af Amer: >90 mL/min (ref >90)
GFR calc non Af Amer: >90 mL/min (ref >90)
Glucose, Bld: 119 mg/dL — ABNORMAL HIGH (ref 70–99)
POTASSIUM: 4 meq/L (ref 3.7–5.3)
Sodium: 135 mEq/L — ABNORMAL LOW (ref 137–147)
Total Protein: 7.4 g/dL (ref 6.0–8.3)

## 2014-03-20 LAB — CBC WITH DIFFERENTIAL/PLATELET
Basophils Absolute: 0 10*3/uL (ref 0.0–0.1)
Basophils Relative: 0 % (ref 0–1)
EOS ABS: 0.1 10*3/uL (ref 0.0–0.7)
EOS PCT: 1 % (ref 0–5)
HCT: 33.9 % — ABNORMAL LOW (ref 36.0–46.0)
HEMOGLOBIN: 11.6 g/dL — AB (ref 12.0–15.0)
Lymphocytes Relative: 15 % (ref 12–46)
Lymphs Abs: 2.3 10*3/uL (ref 0.7–4.0)
MCH: 29.4 pg (ref 26.0–34.0)
MCHC: 34.2 g/dL (ref 30.0–36.0)
MCV: 86 fL (ref 78.0–100.0)
MONOS PCT: 12 % (ref 3–12)
Monocytes Absolute: 1.9 10*3/uL — ABNORMAL HIGH (ref 0.1–1.0)
NEUTROS PCT: 72 % (ref 43–77)
Neutro Abs: 10.9 10*3/uL — ABNORMAL HIGH (ref 1.7–7.7)
Platelets: 272 10*3/uL (ref 150–400)
RBC: 3.94 MIL/uL (ref 3.87–5.11)
RDW: 14.7 % (ref 11.5–15.5)
WBC: 15.1 10*3/uL — ABNORMAL HIGH (ref 4.0–10.5)

## 2014-03-20 LAB — URINALYSIS, ROUTINE W REFLEX MICROSCOPIC
BILIRUBIN URINE: NEGATIVE
GLUCOSE, UA: NEGATIVE mg/dL
Hgb urine dipstick: NEGATIVE
Ketones, ur: NEGATIVE mg/dL
Nitrite: NEGATIVE
PH: 6.5 (ref 5.0–8.0)
Protein, ur: NEGATIVE mg/dL
Specific Gravity, Urine: 1.012 (ref 1.005–1.030)
Urobilinogen, UA: 1 mg/dL (ref 0.0–1.0)

## 2014-03-20 LAB — URINE MICROSCOPIC-ADD ON

## 2014-03-20 LAB — POC URINE PREG, ED: Preg Test, Ur: NEGATIVE

## 2014-03-20 MED ORDER — SODIUM BICARBONATE 4 % IV SOLN
5.0000 mL | Freq: Once | INTRAVENOUS | Status: AC
  Administered 2014-03-20: 5 mL via SUBCUTANEOUS
  Filled 2014-03-20: qty 5

## 2014-03-20 MED ORDER — LIDOCAINE HCL (PF) 1 % IJ SOLN
30.0000 mL | Freq: Once | INTRAMUSCULAR | Status: AC
  Administered 2014-03-20: 30 mL
  Filled 2014-03-20: qty 30

## 2014-03-20 NOTE — ED Notes (Signed)
EMS called to [facility] for overdose. Pt reports she took double doses of her medications, 2000 mg Depakote, 20 mg Hydrazoline, 1 trazodone unknown dose, and 1 lorazepam unknown dose. NSR on monitor.

## 2014-03-20 NOTE — ED Notes (Signed)
Pt refusing vital signs, pt stated to the nurse, "you better not get near me, I don't like you."

## 2014-03-20 NOTE — ED Provider Notes (Signed)
CSN: [id number]     Arrival date & time 03/20/14  1625 History   First MD Initiated Contact with Patient 03/20/14 1700     Chief Complaint  Patient presents with  . Drug Overdose     (Consider location/radiation/quality/duration/timing/severity/associated sxs/prior Treatment) HPI  This is a 28 year old female with a past medical history of paranoid schizophrenia and catatonia is brought in for drug overdose. [facility] The patient states that she missed her medications yesterday and double her dose of her Depakote, hydroxyzine, trazodone, and lorazepam today. Patient was also trying to make herself sleep. Complains of pain of a large abscess on her tailbone. The patient denies intentional harm. The patient is somnolent but arousable to voice and touch. She denies fevers, chills, myalgias or arthralgias. She has urinated several times here in the emergency department.  Past Medical History  Diagnosis Date  . Schizophrenia   . CERVICITIS, GONOCOCCAL, History of 01/09/2007    Qualifier: History of  By: McDiarmid MD, Tawanna Coolerodd    . ATTENTION DEFICIT, W/O HYPERACTIVITY, History of 06/30/2006    Qualifier: History of  By: McDiarmid MD, Tawanna Coolerodd    . Depression   . 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 Dennie Bibleichard Larsen (Psych) [facility]  [place]. Qualifier: Hospitalized for  By: McDiarmid MD, Tawanna Coolerodd    . 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 TOBACCO USER 02/08/2009    Qualifier: Diagnosis of  By: Knox Royaltydell, Erin    . 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 ASC-cannot exclude HGSIL on Pap 02/15/2012    ASC-US on 02/03/2012 pap (associated Trichomonas infection). No reflex HPV testing performed on specimen.  Patient informed that she will need repeat Pap in one year.      . Diabetes mellitus     diet  controlled  . Eczema   . Abnormal Pap smear    Past Surgical History  Procedure Laterality Date  . Incision and drainage      pilanodal cyst   Family History  Problem Relation Age of Onset  . Adopted: Yes  . Bipolar disorder Sister   . Alcohol abuse Brother   . Cancer Father   . Diabetes Mother    History  Substance Use Topics  . Smoking status: Current Every Day Smoker -- 0.50 packs/day for 10 years    Types: Cigarettes  . Smokeless tobacco: Never Used  . Alcohol Use: Yes     Comment: occ   OB History    Gravida Para Term Preterm AB TAB SAB Ectopic Multiple Living   1 1 1       1      Review of Systems  Unable to perform ROS: Psychiatric disorder      Allergies  Abilify  Home Medications   Prior to Admission medications   Medication Sig Start Date End Date Taking? Authorizing Provider  divalproex (DEPAKOTE ER) 500 MG 24 hr tablet Take 2 tablets (1,000 mg total) by mouth 2 (two) times daily after a meal. For mood control 02/20/14   Fransisca KaufmannLaura Davis, NP  haloperidol (HALDOL) 10 MG tablet Take 1 tablet (10 mg total) by mouth daily. 02/20/14   Fransisca KaufmannLaura Davis, NP  haloperidol (HALDOL) 5 MG tablet Take 1 tablet (5 mg total) by mouth at  bedtime. 02/20/14   Fransisca KaufmannLaura Davis, NP  hydrOXYzine (ATARAX/VISTARIL) 50 MG tablet Take 1 tablet (50 mg total) by mouth at bedtime. 02/20/14   Fransisca KaufmannLaura Davis, NP  LORazepam (ATIVAN) 1 MG tablet Take 1 tablet (1 mg total) by mouth 2 (two) times daily. 02/20/14   Fransisca KaufmannLaura Davis, NP  Prenatal Vit-Fe Fumarate-FA (PRENATAL MULTIVITAMIN) TABS tablet Take 1 tablet by mouth daily at 12 noon. 02/20/14   Fransisca KaufmannLaura Davis, NP  traZODone (DESYREL) 100 MG tablet Take 1 tablet (100 mg total) by mouth at bedtime and may repeat dose one time if needed. 02/20/14   Fransisca KaufmannLaura Davis, NP   BP 110/67 mmHg  Pulse 96  Temp(Src) 98.7 F (37.1 C) (Oral)  Resp 16  SpO2 100% Physical Exam  Constitutional: She is oriented to person, place, and time. She appears well-developed and  well-nourished. No distress.  lethargic  HENT:  Head: Normocephalic and atraumatic.  Eyes: Conjunctivae are normal. Pupils are equal, round, and reactive to light.  Neck: Normal range of motion. No JVD present.  Cardiovascular: Normal rate and regular rhythm.   Pulmonary/Chest: Effort normal. She has no wheezes.  Abdominal: Soft. She exhibits no distension. There is no tenderness. There is no guarding.  Musculoskeletal: Normal range of motion.  Neurological: She is oriented to person, place, and time.  somnolent  Skin: Skin is warm and dry.  Nursing note and vitals reviewed.   ED Course  Procedures (including critical care time) Labs Review Labs Reviewed - No data to display  Imaging Review No results found.   EKG Interpretation None       INCISION AND DRAINAGE Performed by: Arthor CaptainHarris, Ayleen Mckinstry Consent: Verbal consent obtained. Risks and benefits: risks, benefits and alternatives were discussed Type: abscess  Body area: gluteal cleft  Anesthesia: local infiltration  Incision was made with a scalpel.  Local anesthetic: lidocaine 2% w/o epinephrine  Anesthetic total: 5 ml  Complexity: complex Blunt dissection to break up loculations  Drainage: purulent  Drainage amount: copious  Packing material: 1/4 in iodoform gauze  Patient tolerance: Patient tolerated the procedure well with no immediate complications.     MDM   Final diagnoses:  Accidental overdose, initial encounter  Abscess of anal or rectal region    5:27 PM BP 110/67 mmHg  Pulse 96  Temp(Src) 98.7 F (37.1 C) (Oral)  Resp 16  SpO2 100%  Patient will need 6-8 hours of observation per [place] poison control center.  Patient refused labs. She does not need them at this point.   Patient labs resulted. + leukocytosis, likely from rectal abcess. She has been stable throughout the visit and is awake and alert. Will d.c the patient. Return to the ED in 2 days for packing removal and wound  check.      Arthor CaptainAbigail Chianna Spirito, PA-C 03/24/14 1710  Ward GivensIva L Knapp, MD 03/26/14 1455

## 2014-03-20 NOTE — Discharge Instructions (Signed)
Abscess Care After An abscess (also called a boil or furuncle) is an infected area that contains a collection of pus. Signs and symptoms of an abscess include pain, tenderness, redness, or hardness, or you may feel a moveable soft area under your skin. An abscess can occur anywhere in the body. The infection may spread to surrounding tissues causing cellulitis. A cut (incision) by the surgeon was made over your abscess and the pus was drained out. Gauze may have been packed into the space to provide a drain that will allow the cavity to heal from the inside outwards. The boil may be painful for 5 to 7 days. Most people with a boil do not have high fevers. Your abscess, if seen early, may not have localized, and may not have been lanced. If not, another appointment may be required for this if it does not get better on its own or with medications. HOME CARE INSTRUCTIONS   Only take over-the-counter or prescription medicines for pain, discomfort, or fever as directed by your caregiver.  When you bathe, soak and then remove gauze or iodoform packs at least daily or as directed by your caregiver. You may then wash the wound gently with mild soapy water. Repack with gauze or do as your caregiver directs. SEEK IMMEDIATE MEDICAL CARE IF:   You develop increased pain, swelling, redness, drainage, or bleeding in the wound site.  You develop signs of generalized infection including muscle aches, chills, fever, or a general ill feeling.  An oral temperature above 102 F (38.9 C) develops, not controlled by medication. See your caregiver for a recheck if you develop any of the symptoms described above. If medications (antibiotics) were prescribed, take them as directed. Document Released: 11/05/2004 Document Revised: 07/12/2011 Document Reviewed: 07/03/2007 [facility] Patient Information 2015 [place], [place]. This information is not intended to replace advice given to you by your health care provider. Make sure  you discuss any questions you have with your health care provider.  Accidental Overdose A drug overdose occurs when a chemical substance (drug or medication) is used in amounts large enough to overcome a person. This may result in severe illness or death. This is a type of poisoning. Accidental overdoses of medications or other substances come from a variety of reasons. When this happens accidentally, it is often because the person taking the substance does not know enough about what they have taken. Drugs which commonly cause overdose deaths are alcohol, psychotropic medications (medications which affect the mind), pain medications, illegal drugs (street drugs) such as cocaine and heroin, and multiple drugs taken at the same time. It may result from careless behavior (such as over-indulging at a party). Other causes of overdose may include multiple drug use, a lapse in memory, or drug use after a period of no drug use.  Sometimes overdosing occurs because a person cannot remember if they have taken their medication.  A common unintentional overdose in young children involves multi-vitamins containing iron. Iron is a part of the hemoglobin molecule in blood. It is used to transport oxygen to living cells. When taken in small amounts, iron allows the body to restock hemoglobin. In large amounts, it causes problems in the body. If this overdose is not treated, it can lead to death. Never take medicines that show signs of tampering or do not seem quite right. Never take medicines in the dark or in poor lighting. Read the label and check each dose of medicine before you take it. When adults are poisoned,  it happens most often through carelessness or lack of information. Taking medicines in the dark or taking medicine prescribed for someone else to treat the same type of problem is a dangerous practice. SYMPTOMS  Symptoms of overdose depend on the medication and amount taken. They can vary from over-activity with  stimulant over-dosage, to sleepiness from depressants such as alcohol, narcotics and tranquilizers. Confusion, dizziness, nausea and vomiting may be present. If problems are severe enough coma and death may result. DIAGNOSIS  Diagnosis and management are generally straightforward if the drug is known. Otherwise it is more difficult. At times, certain symptoms and signs exhibited by the patient, or blood tests, can reveal the drug in question.  TREATMENT  In an emergency department, most patients can be treated with supportive measures. Antidotes may be available if there has been an overdose of opioids or benzodiazepines. A rapid improvement will often occur if this is the cause of overdose. At home or away from medical care:  There may be no immediate problems or warning signs in children.  Not everything works well in all cases of poisoning.  Take immediate action. Poisons may act quickly.  If you think someone has swallowed medicine or a household product, and the person is unconscious, having seizures (convulsions), or is not breathing, immediately call for an ambulance. IF a person is conscious and appears to be doing OK but has swallowed a poison:  Do not wait to see what effect the poison will have. Immediately call a poison control center (listed in the white pages of your telephone book under "Poison Control" or inside the front cover with other emergency numbers). Some poison control centers have TTY capability for the deaf. Check with your local center if you or someone in your family requires this service.  Keep the container so you can read the label on the product for ingredients.  Describe what, when, and how much was taken and the age and condition of the person poisoned. Inform them if the person is vomiting, choking, drowsy, shows a change in color or temperature of skin, is conscious or unconscious, or is convulsing.  Do not cause vomiting unless instructed by medical  personnel. Do not induce vomiting or force liquids into a person who is convulsing, unconscious, or very drowsy. Stay calm and in control.   Activated charcoal also is sometimes used in certain types of poisoning and you may wish to add a supply to your emergency medicines. It is available without a prescription. Call a poison control center before using this medication. PREVENTION  Thousands of children die every year from unintentional poisoning. This may be from household chemicals, poisoning from carbon monoxide in a car, taking their parent's medications, or simply taking a few iron pills or vitamins with iron. Poisoning comes from unexpected sources.  Store medicines out of the sight and reach of children, preferably in a locked cabinet. Do not keep medications in a food cabinet. Always store your medicines in a secure place. Get rid of expired medications.  If you have children living with you or have them as occasional guests, you should have child-resistant caps on your medicine containers. Keep everything out of reach. Child proof your home.  If you are called to the telephone or to answer the door while you are taking a medicine, take the container with you or put the medicine out of the reach of small children.  Do not take your medication in front of children. Do not tell your  child how good a medication is and how good it is for them. They may get the idea it is more of a treat.  If you are an adult and have accidentally taken an overdose, you need to consider how this happened and what can be done to prevent it from happening again. If this was from a street drug or alcohol, determine if there is a problem that needs addressing. If you are not sure a problems exists, it is easy to talk to a professional and ask them if they think you have a problem. It is better to handle this problem in this way before it happens again and has a much worse consequence. Document Released: 07/03/2004  Document Revised: 07/12/2011 Document Reviewed: 12/09/2008 [facility] Patient Information 2015 [place]. This information is not intended to replace advice given to you by your health care provider. Make sure you discuss any questions you have with your health care provider.

## 2014-03-20 NOTE — ED Notes (Signed)
Pt. Refused discharge vital signs.

## 2014-03-20 NOTE — ED Notes (Signed)
I convinced pt to allow me to draw blood.

## 2014-03-20 NOTE — ED Notes (Signed)
Bed: WA08 Expected date:  Expected time:  Means of arrival:  Comments: EMS medication eval.

## 2014-03-20 NOTE — ED Notes (Signed)
Pt reports took double her medications today because she "was trying to catch up on medications." Pt reports she missed a dose of hydroxazine last night, so she took double today. Pt reports pain from abscess at tailbone area. Denies SI/HI, reports she was not trying to hurt herself when she took medications. Pt appears drowsy but is A&O x 4, NAD noted.

## 2014-03-20 NOTE — ED Notes (Signed)
Pt is refusing lab work. ( EMS placed line, pt even refusing RN to pull from that )

## 2014-03-20 NOTE — ED Notes (Signed)
Pt has been stuck twice by ED phlebotomist in attempt to obtain blood work. Pt refusing to let nurse or another phlebotomist stick her. PA aware.

## 2014-03-21 LAB — VALPROIC ACID LEVEL: Valproic Acid Lvl: 88.6 ug/mL (ref 50.0–100.0)

## 2014-03-29 ENCOUNTER — Encounter (HOSPITAL_COMMUNITY): Payer: Self-pay

## 2014-03-29 ENCOUNTER — Emergency Department (HOSPITAL_COMMUNITY)
Admission: EM | Admit: 2014-03-29 | Discharge: 2014-03-29 | Discharge status: Against Medical Advice | Payer: Medicare Other | Attending: Emergency Medicine | Admitting: Emergency Medicine

## 2014-03-29 DIAGNOSIS — Z8619 Personal history of other infectious and parasitic diseases: Secondary | ICD-10-CM | POA: Diagnosis not present

## 2014-03-29 DIAGNOSIS — Z872 Personal history of diseases of the skin and subcutaneous tissue: Secondary | ICD-10-CM | POA: Insufficient documentation

## 2014-03-29 DIAGNOSIS — F22 Delusional disorders: Secondary | ICD-10-CM | POA: Diagnosis not present

## 2014-03-29 DIAGNOSIS — F329 Major depressive disorder, single episode, unspecified: Secondary | ICD-10-CM | POA: Diagnosis not present

## 2014-03-29 DIAGNOSIS — Z79899 Other long term (current) drug therapy: Secondary | ICD-10-CM | POA: Insufficient documentation

## 2014-03-29 DIAGNOSIS — E119 Type 2 diabetes mellitus without complications: Secondary | ICD-10-CM | POA: Diagnosis not present

## 2014-03-29 DIAGNOSIS — Z4800 Encounter for change or removal of nonsurgical wound dressing: Secondary | ICD-10-CM | POA: Diagnosis not present

## 2014-03-29 DIAGNOSIS — Z4801 Encounter for change or removal of surgical wound dressing: Secondary | ICD-10-CM | POA: Insufficient documentation

## 2014-03-29 DIAGNOSIS — Z720 Tobacco use: Secondary | ICD-10-CM | POA: Insufficient documentation

## 2014-03-29 DIAGNOSIS — Z5189 Encounter for other specified aftercare: Secondary | ICD-10-CM

## 2014-03-29 NOTE — ED Provider Notes (Signed)
CSN: [id number]     Arrival date & time 03/29/14  1717 History  This chart was scribed for Wynetta Emery, PA-C, working with Tilden Fossa, MD found by Elon Spanner, ED Scribe. This patient was seen in room WTR5/WTR5 and the patient's care was started at 6:05 PM.   Chief Complaint  Patient presents with  . Abscess   The history is provided by the patient. No language interpreter was used.   HPI Comments: Molly [name] is a 28 y.o. female who presents to the Emergency Department complaining of scabbed area on her buttocks.  Patient was seen on November 18 for drug overdose, ID was also performed on rectal abscess. According to chart review the wound was packed. Patient is requesting a topical cream for this scab to the area. She denies it was packed, she denies any packing came out. She states that it is just a scab. Patient has not been compliant with her psychiatric medications, she cannot tell me when the last time she took them was. She states that she does have a prescription for her meds and that she just has to go and pick them up. She states that she is planning on doing that. Denies any suicidal ideation, homicidal ideation, auditory or visual hallucinations, drug or alcohol abuse, fever, chills, nausea, vomiting, chest pain, shortness of breath, abdominal pain, change in bowel or bladder habits, significant pain or drainage from I and D site.   Past Medical History  Diagnosis Date  . Schizophrenia   . CERVICITIS, GONOCOCCAL, History of 01/09/2007    Qualifier: History of  By: McDiarmid MD, Tawanna Cooler    . ATTENTION DEFICIT, W/O HYPERACTIVITY, History of 06/30/2006    Qualifier: History of  By: McDiarmid MD, Tawanna Cooler    . Depression   . 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 Dennie Bible (Psych) At [facility] in  [place], [place]. Qualifier: Hospitalized for  By: McDiarmid MD, Tawanna Cooler    . 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 TOBACCO USER 02/08/2009    Qualifier: Diagnosis of  By: Knox Royalty    . 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 ASC-cannot exclude HGSIL on Pap 02/15/2012    ASC-US on 02/03/2012 pap (associated Trichomonas infection). No reflex HPV testing performed on specimen.  Patient informed that she will need repeat Pap in one year.      . Diabetes mellitus     diet controlled  . Eczema   . Abnormal Pap smear    Past Surgical History  Procedure Laterality Date  . Incision and drainage      pilanodal cyst   Family History  Problem Relation Age of Onset  . Adopted: Yes  . Bipolar disorder Sister   . Alcohol abuse Brother   . Cancer Father   . Diabetes Mother    History  Substance Use Topics  . Smoking status: Current Every Day Smoker -- 0.50 packs/day for 10 years    Types: Cigarettes  . Smokeless tobacco: Never Used  . Alcohol Use: Yes     Comment: occ   OB History    Gravida Para Term Preterm AB TAB SAB Ectopic Multiple Living   1 1 1       1      Review of Systems A complete 10  system review of systems was obtained and all systems are negative except as noted in the HPI and PMH.   Allergies  Abilify  Home Medications   Prior to Admission medications   Medication Sig Start Date End Date Taking? Authorizing Provider  divalproex (DEPAKOTE ER) 500 MG 24 hr tablet Take 2 tablets (1,000 mg total) by mouth 2 (two) times daily after a meal. For mood control 02/20/14  Yes Fransisca KaufmannLaura Davis, NP  haloperidol (HALDOL) 10 MG tablet Take 1 tablet (10 mg total) by mouth daily. 02/20/14  Yes Fransisca KaufmannLaura Davis, NP  haloperidol (HALDOL) 5 MG tablet Take 1 tablet (5 mg total) by mouth at bedtime. 02/20/14  Yes Fransisca KaufmannLaura Davis, NP  hydrOXYzine (ATARAX/VISTARIL) 50 MG tablet Take 1 tablet (50 mg total) by mouth at bedtime. 02/20/14  Yes Fransisca KaufmannLaura Davis, NP  LORazepam (ATIVAN) 1 MG tablet Take 1 tablet (1 mg total) by  mouth 2 (two) times daily. 02/20/14  Yes Fransisca KaufmannLaura Davis, NP  Prenatal Vit-Fe Fumarate-FA (PRENATAL MULTIVITAMIN) TABS tablet Take 1 tablet by mouth daily at 12 noon. 02/20/14  Yes Fransisca KaufmannLaura Davis, NP  traZODone (DESYREL) 100 MG tablet Take 1 tablet (100 mg total) by mouth at bedtime and may repeat dose one time if needed. 02/20/14  Yes Fransisca KaufmannLaura Davis, NP   BP 145/81 mmHg  Pulse 98  Temp(Src) 98.7 F (37.1 C) (Oral)  Resp 16  SpO2 100% Physical Exam  Constitutional: She is oriented to person, place, and time. She appears well-developed and well-nourished. No distress.  HENT:  Head: Normocephalic and atraumatic.  Mouth/Throat: Oropharynx is clear and moist.  Eyes: Conjunctivae and EOM are normal. Pupils are equal, round, and reactive to light.  Neck: Normal range of motion.  Cardiovascular: Normal rate.   Pulmonary/Chest: Effort normal. No stridor.  Musculoskeletal: Normal range of motion.  Neurological: She is alert and oriented to person, place, and time.  Psychiatric: Her speech is normal. Her affect is blunt. Thought content is paranoid.  Oriented 3, flat affect, denies suicidal ideation, homicidal ideation, auditory or visual hallucinations, drug or alcohol abuse.  Nursing note and vitals reviewed.   ED Course  Procedures (including critical care time) DIAGNOSTIC STUDIES: Oxygen Saturation is 1100% on RA, normal by my interpretation.    COORDINATION OF CARE:  7:49 PM Will prescribe topical antibiotic ointment.  Patient acknowledges and agrees with plan.    Labs Review Labs Reviewed - No data to display  Imaging Review No results found.   EKG Interpretation None      MDM   Final diagnoses:  Wound check, abscess   Filed Vitals:   03/29/14 1747  BP: 145/81  Pulse: 98  Temp: 98.7 F (37.1 C)  TempSrc: Oral  Resp: 16  SpO2: 100%    Medications - No data to display  Molly [name] is a 28 y.o. female presenting for evaluation of gluteal abscess. Chart review shows  that abscess was packed. Patient refuses to let me evaluate the area, I have explained to her that the packing may need to be removed. She denies that there is any packing in place and refuses on multiple occasions to allow evaluation of the affected area. Patient is oriented 3, she denies any suicidal ideation, homicidal ideation, auditory or visual hallucinations, drug or alcohol abuse. Patient is noncompliant with her psychiatric meds, however there is no indication for emergent psychiatric intervention or ability to force a physical evaluation. I have discussed this with the attending who agrees that there is no  indication for IVC. When I return to the patient's room to again ask her to allow me to evaluate her she has eloped.  I personally performed the services described in this documentation, which was scribed in my presence. The recorded information has been reviewed and is accurate.    Wynetta Emeryicole Macy Lingenfelter, PA-C 03/29/14 1954  Tilden FossaElizabeth Rees, MD 03/30/14 [phone]

## 2014-03-29 NOTE — ED Notes (Signed)
Upon calling pt to room and asking her to change according med clearance protocol. Pt states she is not here for that and she is here for recheck of abcess on butt. Asked to check pt vitals and pt complied

## 2014-03-29 NOTE — ED Notes (Signed)
Pt seen on 11/18 for an abscess on buttocks.  Pt here today for recheck.  However, person with patient states she can no longer stay at [facility] b/c she if off her meds.

## 2014-03-29 NOTE — ED Notes (Addendum)
Pt not in room. Checked both restrooms, no sign of pt. Pt's belongings no longer in room

## 2014-04-05 ENCOUNTER — Inpatient Hospital Stay (EMERGENCY_DEPARTMENT_HOSPITAL)
Admission: AD | Admit: 2014-04-05 | Discharge: 2014-04-09 | Disposition: A | Discharge status: Other Acute Inpatient Hospital | Payer: Medicare Other | Source: Ambulatory Visit | Attending: Emergency Medicine | Admitting: Emergency Medicine

## 2014-04-05 DIAGNOSIS — Z3202 Encounter for pregnancy test, result negative: Secondary | ICD-10-CM

## 2014-04-05 DIAGNOSIS — Z720 Tobacco use: Secondary | ICD-10-CM

## 2014-04-05 DIAGNOSIS — Z79899 Other long term (current) drug therapy: Secondary | ICD-10-CM | POA: Insufficient documentation

## 2014-04-05 DIAGNOSIS — F200 Paranoid schizophrenia: Secondary | ICD-10-CM | POA: Diagnosis present

## 2014-04-05 DIAGNOSIS — F250 Schizoaffective disorder, bipolar type: Secondary | ICD-10-CM | POA: Diagnosis present

## 2014-04-05 DIAGNOSIS — D72829 Elevated white blood cell count, unspecified: Secondary | ICD-10-CM | POA: Diagnosis present

## 2014-04-05 DIAGNOSIS — F29 Unspecified psychosis not due to a substance or known physiological condition: Secondary | ICD-10-CM | POA: Diagnosis not present

## 2014-04-05 DIAGNOSIS — Z590 Homelessness: Secondary | ICD-10-CM | POA: Diagnosis not present

## 2014-04-05 DIAGNOSIS — Z872 Personal history of diseases of the skin and subcutaneous tissue: Secondary | ICD-10-CM | POA: Insufficient documentation

## 2014-04-05 DIAGNOSIS — E119 Type 2 diabetes mellitus without complications: Secondary | ICD-10-CM

## 2014-04-05 DIAGNOSIS — Z8619 Personal history of other infectious and parasitic diseases: Secondary | ICD-10-CM | POA: Insufficient documentation

## 2014-04-05 DIAGNOSIS — F329 Major depressive disorder, single episode, unspecified: Secondary | ICD-10-CM | POA: Insufficient documentation

## 2014-04-05 DIAGNOSIS — F603 Borderline personality disorder: Secondary | ICD-10-CM | POA: Diagnosis not present

## 2014-04-05 DIAGNOSIS — Z9114 Patient's other noncompliance with medication regimen: Secondary | ICD-10-CM | POA: Diagnosis not present

## 2014-04-05 DIAGNOSIS — R4183 Borderline intellectual functioning: Secondary | ICD-10-CM | POA: Diagnosis present

## 2014-04-05 LAB — CBC WITH DIFFERENTIAL/PLATELET
Basophils Absolute: 0 10*3/uL (ref 0.0–0.1)
Basophils Relative: 0 % (ref 0–1)
Eosinophils Absolute: 0.1 10*3/uL (ref 0.0–0.7)
Eosinophils Relative: 1 % (ref 0–5)
HCT: 37.1 % (ref 36.0–46.0)
Hemoglobin: 12.4 g/dL (ref 12.0–15.0)
LYMPHS ABS: 4.1 10*3/uL — AB (ref 0.7–4.0)
LYMPHS PCT: 32 % (ref 12–46)
MCH: 28.8 pg (ref 26.0–34.0)
MCHC: 33.4 g/dL (ref 30.0–36.0)
MCV: 86.1 fL (ref 78.0–100.0)
Monocytes Absolute: 1.2 10*3/uL — ABNORMAL HIGH (ref 0.1–1.0)
Monocytes Relative: 10 % (ref 3–12)
NEUTROS PCT: 57 % (ref 43–77)
Neutro Abs: 7.4 10*3/uL (ref 1.7–7.7)
PLATELETS: 393 10*3/uL (ref 150–400)
RBC: 4.31 MIL/uL (ref 3.87–5.11)
RDW: 14.7 % (ref 11.5–15.5)
WBC: 12.9 10*3/uL — AB (ref 4.0–10.5)

## 2014-04-05 LAB — BASIC METABOLIC PANEL
Anion gap: 16 — ABNORMAL HIGH (ref 5–15)
BUN: 10 mg/dL (ref 6–23)
CO2: 21 meq/L (ref 19–32)
Calcium: 9.8 mg/dL (ref 8.4–10.5)
Chloride: 99 mEq/L (ref 96–112)
Creatinine, Ser: 0.84 mg/dL (ref 0.50–1.10)
GFR calc Af Amer: >90 mL/min (ref >90)
GFR calc non Af Amer: >90 mL/min (ref >90)
Glucose, Bld: 104 mg/dL — ABNORMAL HIGH (ref 70–99)
POTASSIUM: 4 meq/L (ref 3.7–5.3)
SODIUM: 136 meq/L — AB (ref 137–147)

## 2014-04-05 LAB — ETHANOL

## 2014-04-05 MED ORDER — ZIPRASIDONE MESYLATE 20 MG IM SOLR
20.0000 mg | Freq: Once | INTRAMUSCULAR | Status: AC
  Administered 2014-04-05: 20 mg via INTRAMUSCULAR

## 2014-04-05 NOTE — BH Assessment (Addendum)
Tele Assessment Note   Molly [name] is an 28 y.o. female who [facility] thinking that she was pregnant and about to have a baby. Per RN she urinated on herself and said that her water broke. She was disheveled and looked like she had not been taking care of basic ADL's. . Pt was flat and unresponsive with Clinical research associatewriter. She said she is homeless and has been at a shelter. She also said that she can "go to her aunts house when she has the baby". After that she completely shut down and became catatonic. Pt was lying in the bed without talking despite continued prompting, she was rubbing her stomach. No other information was able to be obtained from pt at this time. Per Burnett HarryShelly and Maury DusEric Caplan, [facility] it is advised [facility] and have her transported there for further evaluation.  Axis I: Schizophrenia Axis II: Deferred Axis III:  Past Medical History  Diagnosis Date  . Schizophrenia   . CERVICITIS, GONOCOCCAL, History of 01/09/2007    Qualifier: History of  By: McDiarmid MD, Tawanna Coolerodd    . ATTENTION DEFICIT, W/O HYPERACTIVITY, History of 06/30/2006    Qualifier: History of  By: McDiarmid MD, Tawanna Coolerodd    . Depression   . 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 Dennie Bibleichard Larsen (Psych) [facility]  [place], [place]. Qualifier: Hospitalized for  By: McDiarmid MD, Tawanna Coolerodd    . 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 TOBACCO USER 02/08/2009    Qualifier: Diagnosis of  By: Knox Royaltydell, Erin    . 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 ASC-cannot exclude HGSIL on Pap 02/15/2012    ASC-US on 02/03/2012 pap (associated Trichomonas infection). No reflex HPV testing performed on specimen.  Patient informed that she will need repeat Pap in one year.      . Diabetes mellitus     diet controlled  .  Eczema   . Abnormal Pap smear    Axis IV: economic problems, housing problems, occupational problems, other psychosocial or environmental problems and problems with access to health care services Axis V: 21-30 behavior considerably influenced by delusions or hallucinations OR serious impairment in judgment, communication OR inability to function in almost all areas  Past Medical History:  Past Medical History  Diagnosis Date  . Schizophrenia   . CERVICITIS, GONOCOCCAL, History of 01/09/2007    Qualifier: History of  By: McDiarmid MD, Tawanna Coolerodd    . ATTENTION DEFICIT, W/O HYPERACTIVITY, History of 06/30/2006    Qualifier: History of  By: McDiarmid MD, Tawanna Coolerodd    . Depression   . 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 Dennie Bibleichard Larsen (Psych[facility]  [place], [place]. Qualifier: Hospitalized for  By: McDiarmid MD, Tawanna Coolerodd    . 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 TOBACCO USER 02/08/2009    Qualifier: Diagnosis of  By: Knox Royaltydell, Erin    . 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 ASC-cannot exclude HGSIL on Pap 02/15/2012    ASC-US on 02/03/2012 pap (associated Trichomonas infection). No reflex HPV testing  performed on specimen.  Patient informed that she will need repeat Pap in one year.      . Diabetes mellitus     diet controlled  . Eczema   . Abnormal Pap smear     Past Surgical History  Procedure Laterality Date  . Incision and drainage      pilanodal cyst    Family History:  Family History  Problem Relation Age of Onset  . Adopted: Yes  . Bipolar disorder Sister   . Alcohol abuse Brother   . Cancer Father   . Diabetes Mother     Social History:  reports that she has been smoking Cigarettes.  She has a 5 pack-year smoking history. She has never used smokeless tobacco. She reports that she drinks alcohol. She reports  that she does not use illicit drugs.  Additional Social History:     CIWA: CIWA-Ar BP: 129/90 mmHg Pulse Rate: 84 COWS:    PATIENT STRENGTHS: (choose at least two) General fund of knowledge  Allergies:  Allergies  Allergen Reactions  . Abilify [Aripiprazole] Other (See Comments)    Thinks its nasty- does not want it    Home Medications:  Medications Prior to Admission  Medication Sig Dispense Refill  . divalproex (DEPAKOTE ER) 500 MG 24 hr tablet Take 2 tablets (1,000 mg total) by mouth 2 (two) times daily after a meal. For mood control 120 tablet 0  . haloperidol (HALDOL) 10 MG tablet Take 1 tablet (10 mg total) by mouth daily. 30 tablet 0  . haloperidol (HALDOL) 5 MG tablet Take 1 tablet (5 mg total) by mouth at bedtime. 30 tablet 0  . hydrOXYzine (ATARAX/VISTARIL) 50 MG tablet Take 1 tablet (50 mg total) by mouth at bedtime. (Patient not taking: Reported on 04/05/2014) 30 tablet 0  . LORazepam (ATIVAN) 1 MG tablet Take 1 tablet (1 mg total) by mouth 2 (two) times daily. (Patient not taking: Reported on 04/05/2014) 30 tablet 0  . Prenatal Vit-Fe Fumarate-FA (PRENATAL MULTIVITAMIN) TABS tablet Take 1 tablet by mouth daily at 12 noon. (Patient not taking: Reported on 04/05/2014)    . traZODone (DESYREL) 100 MG tablet Take 1 tablet (100 mg total) by mouth at bedtime and may repeat dose one time if needed. 60 tablet 0    OB/GYN Status:  No LMP recorded. Patient is not currently having periods (Reason: Other).  General Assessment Data Location of Assessment: [facility] ACT Assessment: Yes Is this a Tele or Face-to-Face Assessment?: Tele Assessment Is this an Initial Assessment or a Re-assessment for this encounter?: Initial Assessment Living Arrangements:  (Homeless) Can pt return to current living arrangement?: Yes Admission Status: Other (Comment) ([facility] with delusions of being pregn) Is patient capable of signing voluntary admission?: No Transfer from:  Unknown Referral Source: Self/Family/Friend     [facility] Living Arrangements:  (Homeless) Name of Psychiatrist:  (Unknown) Name of Therapist:  (Unknown)  Education Status Is patient currently in school?:  (No) Current Grade: UTA Highest grade of school patient has completed: UTA Name of school: N/A Contact person: N/A  Risk to self with the past 6 months Suicidal Ideation:  (UTA) Suicidal Intent:  (UTA) Is patient at risk for suicide?:  (UTA) Suicidal Plan?:  (UTA) Access to Means:  (UTA) What has been your use of drugs/alcohol within the last 12 months?:  (UTA) Previous Attempts/Gestures:  (OD on medication not sure if this was SI or not ) How many times?:  (  unknown) Other Self Harm Risks:  (Homeless, Delusional) Triggers for Past Attempts: Unknown Family Suicide History: Unable to assess Recent stressful life event(s): Other (Comment) (UTA) Persecutory voices/beliefs?:  (UTA) Depression: Yes Substance abuse history and/or treatment for substance abuse?:  (UTA) Suicide prevention information given to non-admitted patients: Not applicable  Risk to Others within the past 6 months Homicidal Ideation:  (UTA) Thoughts of Harm to Others:  (UTA) Current Homicidal Intent:  (UTA) Current Homicidal Plan:  (UTA ) Access to Homicidal Means:  (UTA) Identified Victim:  (UTA) History of harm to others?:  (UTA) Assessment of Violence: None Noted (UTA) Violent Behavior Description:  (UTA) Does patient have access to weapons?:  (UTA) Criminal Charges Pending?:  (UTA) Does patient have a court date:  (UTA)  Psychosis Delusions: Grandiose (Thinks she is having a baby but she preg test is neg fromnov)  Mental Status Report Appear/Hygiene: Disheveled, Layered clothes Eye Contact: Poor Motor Activity: Psychomotor retardation Speech: Soft, Slow Level of Consciousness: Unresponsive (To pain or command) Mood:  (Unresponsive) Affect: Blunted, Flat Anxiety Level:   (UTA) Thought Processes: Unable to Assess Judgement: Impaired Orientation: Unable to assess Obsessive Compulsive Thoughts/Behaviors: Severe (fixated that she is pregnant)  Cognitive Functioning Concentration: Poor Memory: Recent Impaired, Remote Impaired IQ:  (UTA) Insight: Poor Appetite:  (UTA) Weight Loss:  (UTA) Weight Gain:  (UTA) Sleep:  (UTA) Total Hours of Sleep:  (UTA) Vegetative Symptoms: Staying in bed, Not bathing, Decreased grooming  ADLScreening [facility] Assessment Services) Patient's cognitive ability adequate to safely complete daily activities?: No Patient able to express need for assistance with ADLs?: No Independently performs ADLs?: No  Prior Inpatient Therapy Prior Inpatient Therapy: Yes Prior Therapy Dates:  (Unknown) Prior Therapy Facilty/Provider(s):  (Unknown) Reason for Treatment:  (Delusions, schizophrenia)  Prior Outpatient Therapy Prior Outpatient Therapy:  (UTA) Prior Therapy Dates:  (UTA) Prior Therapy Facilty/Provider(s):  (UTA) Reason for Treatment:  (UTA)  ADL Screening (condition at time of admission) Patient's cognitive ability adequate to safely complete daily activities?: No Patient able to express need for assistance with ADLs?: No Independently performs ADLs?: No                  Additional Information 1:1 In Past 12 Months?:  (Unknown) CIRT Risk: Yes Elopement Risk: Yes Does patient have medical clearance?: No     Disposition:  Disposition Initial Assessment Completed for this Encounter: Yes (Pt would shut down and would not speak to Clinical research associate ) Disposition of Patient: Other dispositions Other disposition(s):  (Transport to [facility] for further Eval per [place] NP)  Iyanah Demont 04/05/2014 4:14 PM

## 2014-04-05 NOTE — MAU Note (Signed)
telepsych initiated.

## 2014-04-05 NOTE — MAU Provider Note (Signed)
History     CSN: [id number]  Arrival date and time: 04/05/14 1424   First Provider Initiated Contact with Patient 04/05/14 1436      No chief complaint on file.  HPI Molly [name] is 28 y.o. G1P1001 presents by EMS for body pain.  Patient states that she is pregnant, that her water broke and she is ready to have the baby.  She tells me she needs the shot in her back and and IV.  She seems calm but is shaking.  She is uncooperative but calm--refuses to get us a urine sample, refuses exam and lab work.   She report body pain then changed it to leg then to foot pain.  She attributes pain to walking around alot because she is homeless.   Hx of schizophrenia--catatonic and paranoid.  Patient has had 4 Negative pregnancy tests in our system since September.     Past Medical History  Diagnosis Date  . Schizophrenia   . CERVICITIS, GONOCOCCAL, History of 01/09/2007    Qualifier: History of  By: McDiarmid MD, Tawanna Coolerodd    . ATTENTION DEFICIT, W/O HYPERACTIVITY, History of 06/30/2006    Qualifier: History of  By: McDiarmid MD, Tawanna Coolerodd    . Depression   . 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 Dennie Bibleichard Larsen (Psych) [facility]  [place], [place]. Qualifier: Hospitalized for  By: McDiarmid MD, Tawanna Coolerodd    . 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 TOBACCO USER 02/08/2009    Qualifier: Diagnosis of  By: Knox Royaltydell, Erin    . 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 ASC-cannot exclude HGSIL on Pap 02/15/2012    ASC-US on 02/03/2012 pap (associated Trichomonas infection). No reflex HPV testing performed on specimen.  Patient informed that she will need repeat Pap in one year.      . Diabetes mellitus     diet controlled  . Eczema   . Abnormal Pap smear     Past Surgical History  Procedure Laterality Date  . Incision and drainage      pilanodal cyst     Family History  Problem Relation Age of Onset  . Adopted: Yes  . Bipolar disorder Sister   . Alcohol abuse Brother   . Cancer Father   . Diabetes Mother     History  Substance Use Topics  . Smoking status: Current Every Day Smoker -- 0.50 packs/day for 10 years    Types: Cigarettes  . Smokeless tobacco: Never Used  . Alcohol Use: Yes     Comment: occ    Allergies:  Allergies  Allergen Reactions  . Abilify [Aripiprazole] Other (See Comments)    Thinks its nasty- does not want it    Prescriptions prior to admission  Medication Sig Dispense Refill Last Dose  . divalproex (DEPAKOTE ER) 500 MG 24 hr tablet Take 2 tablets (1,000 mg total) by mouth 2 (two) times daily after a meal. For mood control 120 tablet 0 unknown  . haloperidol (HALDOL) 10 MG tablet Take 1 tablet (10 mg total) by mouth daily. 30 tablet 0 unknown  . haloperidol (HALDOL) 5 MG tablet Take 1 tablet (5 mg total) by mouth at bedtime. 30 tablet 0 unknown  . hydrOXYzine (ATARAX/VISTARIL) 50 MG tablet Take 1 tablet (50 mg total) by mouth at  bedtime. (Patient not taking: Reported on 04/05/2014) 30 tablet 0 Past Month at Unknown time  . LORazepam (ATIVAN) 1 MG tablet Take 1 tablet (1 mg total) by mouth 2 (two) times daily. (Patient not taking: Reported on 04/05/2014) 30 tablet 0 Past Month at Unknown time  . Prenatal Vit-Fe Fumarate-FA (PRENATAL MULTIVITAMIN) TABS tablet Take 1 tablet by mouth daily at 12 noon. (Patient not taking: Reported on 04/05/2014)   Past Month at Unknown time  . traZODone (DESYREL) 100 MG tablet Take 1 tablet (100 mg total) by mouth at bedtime and may repeat dose one time if needed. 60 tablet 0 unknown    Review of Systems  Constitutional: Negative for fever and chills.  Gastrointestinal: Negative for nausea, vomiting and abdominal pain.  Genitourinary:       "water broke, time to have my baby"  Neg for vaginal bleeding.   Musculoskeletal:       Right foot pain  Neurological: Negative for  headaches.   Physical Exam   Blood pressure 129/90, pulse 84, temperature 98.1 F (36.7 C), temperature source Oral, resp. rate 18, height 5' 7.75" (1.721 m), weight 270 lb (122.471 kg).  Physical Exam  Constitutional: She appears well-developed. She appears lethargic. She is uncooperative.  Patient refuses examination.  Genitourinary:  Refuses pelvic exam  Neurological: She appears lethargic.  Psychiatric: She is slowed and withdrawn. She is not agitated and not aggressive. Thought content is delusional.   Patient refused to get a urine sample--offer serum HCG-patient refused lab draw.    I went in several times during her admission to make sure she was comfortable.  She would not ask direct questions, acted sleepy.    MAU Course  Procedures  Patient refuses exam, labs.    MDM Requested Psychiatric consult.  Baxter HireKristen called and she will talk with her.   16:00 Discussed with Baxter HireKristen the tele psychiatric conference.  She states patient would not talk with her.  She reports that [facility] and [facility]c [facility] and admission.   Social work to come in and talk with the patient re: housing.  Patient would not engage in a conversation or answer question with Jill SideColleen, CSW. Will [facility]  16:30  [facility] with [facility] Anitra [facility]authPlunkett [facility]-she accepted transfer for further evaluation. House coverage and security were alerted re: patient's belief she is pregnant   19:00 care turned over to J. Magnus SinningWenzel, PA  [facility] Assessment and Plan  A:  Delusional       Hx of schizophrenia-catatonic and paranoid  P:  Transfer by Juel BurrowPelham [facility]  Ramaya Guile,EVE M 04/05/2014, 3:11 PM

## 2014-04-05 NOTE — ED Notes (Addendum)
Pt. To [facility] from ED to room 39. Pt. Is  warm and dry in no distress. Pt. Denies SI, HI, and AVH. Pt. Sleepy, calm and cooperative. Pt. Encouraged to let Nursing staff know of any concerns or needs.

## 2014-04-05 NOTE — BH Assessment (Signed)
[facility]. Spoke to Dr. Gwyneth SproutWhitney Plunkett who said Pt [facility] and assessed via tele-cart earlier today but would not participate in assessment. Pt is psychotic, agitated and uncooperative and has been placed under IVC. Dr. Anitra LauthPlunkett requests Pt be evaluated in person. Gilberto BetterLaQuesta Sims, TTS at [facility], will be notified of assessment.  Harlin RainFord Ellis Ria CommentWarrick Jr, LPC, [facility] Triage Specialist [phone]

## 2014-04-05 NOTE — ED Provider Notes (Signed)
[facility] for further psych eval which was recommended by telepsych for pseudocyesis, dissheveled and did not appear to be able to complete evaluation as she would not speak with person on TV.  Now pt in waiting room and refusing to come to the room for evaluation.  At this point unclear if pt is a danger to self or other but she is agitated and aggressive and concern that she may become violent.  IVC paperwork taken out.  Gwyneth SproutWhitney Shevelle Smither, MD 04/05/14 2115

## 2014-04-05 NOTE — Progress Notes (Signed)
CSW received call from MAU staff NP/E. Key stating patient is here stating she is ready to deliver her baby, however she is not pregnant.  NP states patient refuses to be evaluated by tele-psych.  CSW met with patient to assess.  Patient appears homeless and asked patient if she has a safe place to go when she is discharged from the hospital.  She was non-responsive to most of CSW's questions/comments.  At times she grunted or nodded.  She states she can go to aunts house, however, she could not tell CSW where her aunt lives or how she would get there.  She states she calls her aunt "Jola Baptist" or "April."  Patient was nearly incoherent at this time and CSW is concerned that patient cannot be evaluated or contract for safety at this time and recommends transfer to [place] ED for further evaluation.  CSW discussed with NP and plan is to transfer patient to [facility].

## 2014-04-06 MED ORDER — CHLORPROMAZINE HCL 25 MG PO TABS
50.0000 mg | ORAL_TABLET | Freq: Three times a day (TID) | ORAL | Status: DC
  Administered 2014-04-06 – 2014-04-09 (×8): 50 mg via ORAL
  Filled 2014-04-06 (×9): qty 2

## 2014-04-06 MED ORDER — DIVALPROEX SODIUM ER 500 MG PO TB24
500.0000 mg | ORAL_TABLET | Freq: Two times a day (BID) | ORAL | Status: DC
  Administered 2014-04-07 – 2014-04-09 (×5): 500 mg via ORAL
  Filled 2014-04-06 (×8): qty 1

## 2014-04-06 NOTE — ED Notes (Signed)
Pt incontinent of urine 1x in bed. Linen/scrubs changed.

## 2014-04-06 NOTE — ED Notes (Signed)
Patient refused to have vital signs taken.

## 2014-04-06 NOTE — ED Notes (Signed)
Report received from American FinancialKaren RN. Pt. Sleeping, respirations regular and unlabored. Will continue to monitor for safety. Q 15 minute checks continue.

## 2014-04-06 NOTE — ED Notes (Signed)
Pt resting in NAD. Attempt specimen collection at deferred.

## 2014-04-06 NOTE — ED Notes (Signed)
Pt was given specimen cup and escorted to restroom. Instead of sitting on the toilet, she attempted to provide a specimen while standing up and squeezing the cup between her legs. She remained in the bathroom for more than 10 minutes. When told she could try later, pt would not give the specimen cup back, despite cajoling from two staff members. She indicated she wanted to keep it between her legs in case she urinates later.

## 2014-04-07 DIAGNOSIS — F200 Paranoid schizophrenia: Secondary | ICD-10-CM

## 2014-04-07 DIAGNOSIS — Z9114 Patient's other noncompliance with medication regimen: Secondary | ICD-10-CM | POA: Diagnosis not present

## 2014-04-07 DIAGNOSIS — D72829 Elevated white blood cell count, unspecified: Secondary | ICD-10-CM

## 2014-04-07 DIAGNOSIS — Z91148 Patient's other noncompliance with medication regimen for other reason: Secondary | ICD-10-CM

## 2014-04-07 LAB — POC URINE PREG, ED: PREG TEST UR: NEGATIVE

## 2014-04-07 MED ORDER — LORAZEPAM 1 MG PO TABS
1.0000 mg | ORAL_TABLET | ORAL | Status: DC | PRN
  Administered 2014-04-08: 1 mg via ORAL
  Filled 2014-04-07: qty 1

## 2014-04-07 MED ORDER — LORAZEPAM 2 MG/ML IJ SOLN
1.0000 mg | INTRAMUSCULAR | Status: DC | PRN
Start: 2014-04-07 — End: 2014-04-09

## 2014-04-07 MED ORDER — DOXYCYCLINE HYCLATE 100 MG PO TABS
100.0000 mg | ORAL_TABLET | Freq: Two times a day (BID) | ORAL | Status: DC
  Administered 2014-04-07 – 2014-04-09 (×4): 100 mg via ORAL
  Filled 2014-04-07 (×4): qty 1

## 2014-04-07 NOTE — ED Notes (Signed)
Pt asked to not sit on floor in front of nurses' station, but pt refuses.
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
## 2014-04-07 NOTE — ED Notes (Signed)
Pt asked again for urine specimen. Pt refused.

## 2014-04-07 NOTE — ED Notes (Signed)
1 attempt by phlebotomy to collect Labs. Failed attempt. Will resch for 12/7

## 2014-04-07 NOTE — ED Notes (Signed)
Pt refused vitals 
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
## 2014-04-07 NOTE — BH Assessment (Signed)
[facility] Assessment Progress Note    The following facilities were contacted in an attempt to place the pt:  Referral faxed for review [facility]-beds per [facility]-no beds, but send referral for possible wait list per [name] [facility]bed[facility] faxed for review [facility] per [facility]-beds per Elyn AquasNekeshia Ruther[facility]-beds per Marquita PalmsAkeysha [facility]-beds per Cheral AlmasErin [facility]-beds per [place]  [facility] capacity [facility] per Tarry KosKayla [facility] per Brandt Looseniane [place] per [facility] per Delman Kitteneranda Brynn Marr per Baxter HireKristen  No answer/left message OV at [employer] at [phone] Paulino DoorVidant [place] at [address] [facility] at 1010  TTS will continue to seek placement for the pt.  Casimer LaniusKristen Tanicka Bisaillon, MS, [facility] Licensed Professional Counselor Therapeutic Triage Specialist [facility] Phone: [phone] Fax: [phone]

## 2014-04-07 NOTE — ED Notes (Signed)
Pt asked to shower d/t noticeable body odor. She refused. "Molly NobleY'all are harassing me." Attempted unsuccessfully to educate pt about the benefits of showering daily.
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
## 2014-04-07 NOTE — BH Assessment (Signed)
Per Binnie RailJoann Glover, [facility], adult unit is currently at capacity. Contacted the following facilities for placement:  FAXED CLINICAL INFORMATION, PT IS UNDER REVIEW: [facility], per [facility], per Denny PeonErin  AT CAPACITY: [facility], per [facility], per [facility], per [facility], per [facility], per [facility], per Orion CrookEmily [facility], per [facility], per [facility], per [facility], per [facility], per [facility], per [facility], per [facility], per [facility], per [facility], per Misty StanleyLisa  NO RESPONSE: [facility]   [address] StreetFord Ellis Patsy BaltimoreWarrick Jr, WisconsinLPC, [facility] Triage Specialist [phone]

## 2014-04-07 NOTE — ED Provider Notes (Signed)
I was asked by nursing to evaluate some boils on the patient's buttocks. It appears that most of them have drained and have no significant induration. There is one small abscess noted on the left mid buttock that has some mild surrounding induration. However it is pointing. Will place an order for warm compresses and start the patient on doxycycline.   Purvis SheffieldForrest Brandis Wixted, MD 04/07/14 [phone]

## 2014-04-08 DIAGNOSIS — F29 Unspecified psychosis not due to a substance or known physiological condition: Secondary | ICD-10-CM

## 2014-04-08 LAB — RAPID URINE DRUG SCREEN, HOSP PERFORMED
Amphetamines: NOT DETECTED
BENZODIAZEPINES: NOT DETECTED
Barbiturates: NOT DETECTED
COCAINE: NOT DETECTED
Opiates: NOT DETECTED
Tetrahydrocannabinol: NOT DETECTED

## 2014-04-08 LAB — URINALYSIS, ROUTINE W REFLEX MICROSCOPIC
Bilirubin Urine: NEGATIVE
Glucose, UA: NEGATIVE mg/dL
HGB URINE DIPSTICK: NEGATIVE
Ketones, ur: NEGATIVE mg/dL
Nitrite: NEGATIVE
Protein, ur: NEGATIVE mg/dL
SPECIFIC GRAVITY, URINE: 1.019 (ref 1.005–1.030)
UROBILINOGEN UA: 1 mg/dL (ref 0.0–1.0)
pH: 7 (ref 5.0–8.0)

## 2014-04-08 LAB — URINE MICROSCOPIC-ADD ON

## 2014-04-08 MED ORDER — TRAZODONE HCL 100 MG PO TABS
100.0000 mg | ORAL_TABLET | Freq: Every day | ORAL | Status: DC
  Administered 2014-04-08 – 2014-04-09 (×2): 100 mg via ORAL
  Filled 2014-04-08 (×2): qty 1

## 2014-04-08 NOTE — ED Notes (Signed)
Patient has stayed in her room most of the day.  Is tolerating meals and medications.  Has not refused any medicines today.  Denies thoughts of harm to self or others.

## 2014-04-08 NOTE — ED Notes (Signed)
Pt awake, alert & responsive, no distress noted at present.  Resting at present.  Will continue to monitor for safety.

## 2014-04-08 NOTE — Consult Note (Signed)
[facility] Consult   Reason for Consult:  Delusional  Referring Physician:  EDP   Molly [name] is an 28 y.o. female. Total Time spent with patient: 30 minutes  Assessment: AXIS I:  psychosis AXIS II:  Deferred AXIS III:   Past Medical History  Diagnosis Date  . Schizophrenia   . CERVICITIS, GONOCOCCAL, History of 01/09/2007    Qualifier: History of  By: McDiarmid MD, Sherren Mocha    . ATTENTION DEFICIT, W/O HYPERACTIVITY, History of 06/30/2006    Qualifier: History of  By: McDiarmid MD, Sherren Mocha    . Depression   . 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 Henrene Dodge (Psych) At [facility] in  [place], [place]. Qualifier: Hospitalized for  By: McDiarmid MD, Sherren Mocha    . 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 TOBACCO USER 02/08/2009    Qualifier: Diagnosis of  By: Samara Snide    . 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 ASC-cannot exclude HGSIL on Pap 02/15/2012    ASC-US on 02/03/2012 pap (associated Trichomonas infection). No reflex HPV testing performed on specimen.  Patient informed that she will need repeat Pap in one year.      . Diabetes mellitus     diet controlled  . Eczema   . Abnormal Pap smear    AXIS IV:  other psychosocial or environmental problems and problems related to social environment AXIS V:  21-30 behavior considerably influenced by delusions or hallucinations OR serious impairment in judgment, communication OR inability to function in almost all areas  Plan:  Recommend psychiatric Inpatient admission when medically cleared.  Subjective:   Molly [name] is a 28 y.o. female patient admitted with acute psychosis.  Patient presented to [place] that her water broke and she was going to have a baby.Marland Kitchen  HPI:  Ms. Molly [name] is a 28 year old African American with acute psychosis primarily  somatic delusions.  Patient continues to states that she is pregnant but validates that multiple pregnancy tests were negative.  Patient states that the only person she can talk to is "monica who is an angel"  During assessment interview patient tends to be guarded providing minimal verbal responses to questions.  Patient continues to have poor hygiene and will urinate on herself stating that her water has broke.  Patient denies suicidal ideation, or homicidal ideation.  Speech is somewhat disorganized and slowed.  HPI Elements:   Location:  generalized. Quality:  acute. Severity:  severe. Timing:  ongoing. Duration:  exacerbation of chronic illness. Context:  medication noncompliance.  Past Psychiatric History: Past Medical History  Diagnosis Date  . Schizophrenia   . CERVICITIS, GONOCOCCAL, History of 01/09/2007    Qualifier: History of  By: McDiarmid MD, Sherren Mocha    . ATTENTION DEFICIT, W/O HYPERACTIVITY, History of 06/30/2006    Qualifier: History of  By: McDiarmid MD, Sherren Mocha    . Depression   . 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 Henrene Dodge (Psych) At [facility] in  [place], [place]. Qualifier: Hospitalized for  By: McDiarmid MD, Sherren Mocha    . 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 TOBACCO USER 02/08/2009    Qualifier: Diagnosis of  By: Samara Snide    . 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 ASC-cannot exclude HGSIL on Pap 02/15/2012    ASC-US on 02/03/2012 pap (associated Trichomonas infection). No reflex HPV testing performed on specimen.  Patient informed that she will need repeat Pap in one year.      . Diabetes mellitus     diet controlled  . Eczema   . Abnormal Pap smear     reports that she has been smoking Cigarettes.  She has a 5 pack-year smoking history. She has never used smokeless tobacco. She reports that she  drinks alcohol. She reports that she does not use illicit drugs. Family History  Problem Relation Age of Onset  . Adopted: Yes  . Bipolar disorder Sister   . Alcohol abuse Brother   . Cancer Father   . Diabetes Mother    Family History Substance Abuse:  (UTA) Family Supports:  (UTA) Living Arrangements:  (Homeless) Can pt return to current living arrangement?: Yes   Allergies:   Allergies  Allergen Reactions  . Abilify [Aripiprazole] Other (See Comments)    Thinks its nasty- does not want it    ACT Assessment Complete:  Yes:    Educational Status    Risk to Self: Risk to self with the past 6 months Suicidal Ideation:  (UTA) Suicidal Intent:  (UTA) Is patient at risk for suicide?:  (UTA) Suicidal Plan?:  (UTA) Access to Means:  (UTA) What has been your use of drugs/alcohol within the last 12 months?:  (UTA) Previous Attempts/Gestures:  (OD on medication not sure if this was SI or not ) How many times?:  (unknown) Other Self Harm Risks:  (Homeless, Delusional) Triggers for Past Attempts: Unknown Family Suicide History: Unable to assess Recent stressful life event(s): Other (Comment) (UTA) Persecutory voices/beliefs?:  (UTA) Depression: Yes Substance abuse history and/or treatment for substance abuse?: Yes Suicide prevention information given to non-admitted patients: Not applicable  Risk to Others: Risk to Others within the past 6 months Homicidal Ideation:  (UTA) Thoughts of Harm to Others:  (UTA) Current Homicidal Intent:  (UTA) Current Homicidal Plan:  (UTA ) Access to Homicidal Means:  (UTA) Identified Victim:  (UTA) History of harm to others?:  (UTA) Assessment of Violence: None Noted (UTA) Violent Behavior Description:  (UTA) Does patient have access to weapons?:  (UTA) Criminal Charges Pending?:  (UTA) Does patient have a court date:  Special educational needs teacher)  Abuse:    Prior Inpatient Therapy: Prior Inpatient Therapy Prior Inpatient Therapy: Yes Prior Therapy Dates:   (Unknown) Prior Therapy Facilty/Provider(s):  (Unknown) Reason for Treatment:  (Delusions, schizophrenia)  Prior Outpatient Therapy: Prior Outpatient Therapy Prior Outpatient Therapy:  (UTA) Prior Therapy Dates:  (UTA) Prior Therapy Facilty/Provider(s):  (UTA) Reason for Treatment:  (UTA)  Additional Information: Additional Information 1:1 In Past 12 Months?:  (Unknown) CIRT Risk: Yes Elopement Risk: Yes Does patient have medical clearance?: No                  Objective: Blood pressure 114/66, pulse 64, temperature 98.3 F (36.8 C), temperature source Oral, resp. rate 20, height 5' 8"  (1.727 m), weight 122.556 kg (270 lb 3 oz), SpO2 99 %.Body mass index is 41.09 kg/(m^2). Results for orders placed or performed during the hospital encounter of 04/05/14 (from the past 72 hour(s))  CBC with Differential     Status: Abnormal   Collection Time: 04/05/14  9:21 PM  Result Value Ref Range   WBC 12.9 (H) 4.0 - 10.5 K/uL   RBC 4.31 3.87 - 5.11 MIL/uL   Hemoglobin 12.4 12.0 - 15.0 g/dL   HCT 37.1 36.0 - 46.0 %   MCV 86.1 78.0 - 100.0 fL   MCH 28.8 26.0 - 34.0 pg   MCHC 33.4 30.0 - 36.0 g/dL   RDW 14.7 11.5 - 15.5 %   Platelets 393 150 - 400 K/uL   Neutrophils Relative % 57 43 - 77 %   Neutro Abs 7.4 1.7 - 7.7 K/uL   Lymphocytes Relative 32 12 - 46 %   Lymphs Abs 4.1 (H) 0.7 - 4.0 K/uL   Monocytes Relative 10 3 - 12 %   Monocytes Absolute 1.2 (H) 0.1 - 1.0 K/uL   Eosinophils Relative 1 0 - 5 %   Eosinophils Absolute 0.1 0.0 - 0.7 K/uL   Basophils Relative 0 0 - 1 %   Basophils Absolute 0.0 0.0 - 0.1 K/uL  Basic metabolic panel     Status: Abnormal   Collection Time: 04/05/14  9:21 PM  Result Value Ref Range   Sodium 136 (L) 137 - 147 mEq/L   Potassium 4.0 3.7 - 5.3 mEq/L   Chloride 99 96 - 112 mEq/L   CO2 21 19 - 32 mEq/L   Glucose, Bld 104 (H) 70 - 99 mg/dL   BUN 10 6 - 23 mg/dL   Creatinine, Ser 0.84 0.50 - 1.10 mg/dL   Calcium 9.8 8.4 - 10.5 mg/dL   GFR calc  non Af Amer >90 >90 mL/min   GFR calc Af Amer >90 >90 mL/min    Comment: (NOTE) The eGFR has been calculated using the CKD EPI equation. This calculation has not been validated in all clinical situations. eGFR's persistently <90 mL/min signify possible Chronic Kidney Disease.    Anion gap 16 (H) 5 - 15  Ethanol     Status: None   Collection Time: 04/05/14  9:21 PM  Result Value Ref Range   Alcohol, Ethyl (B) <11 0 - 11 mg/dL    Comment:        LOWEST DETECTABLE LIMIT FOR SERUM ALCOHOL IS 11 mg/dL FOR MEDICAL PURPOSES ONLY   Urinalysis, Routine w reflex microscopic     Status: Abnormal   Collection Time: 04/07/14 11:30 PM  Result Value Ref Range   Color, Urine YELLOW YELLOW   APPearance CLOUDY (A) CLEAR   Specific Gravity, Urine 1.019 1.005 - 1.030   pH 7.0 5.0 - 8.0   Glucose, UA NEGATIVE NEGATIVE mg/dL   Hgb urine dipstick NEGATIVE NEGATIVE   Bilirubin Urine NEGATIVE NEGATIVE   Ketones, ur NEGATIVE NEGATIVE mg/dL   Protein, ur NEGATIVE NEGATIVE mg/dL   Urobilinogen, UA 1.0 0.0 - 1.0 mg/dL   Nitrite NEGATIVE NEGATIVE   Leukocytes, UA SMALL (A) NEGATIVE  Urine microscopic-add on     Status: Abnormal   Collection Time: 04/07/14 11:30 PM  Result Value Ref Range   Squamous Epithelial / LPF FEW (A) RARE   WBC, UA 0-2 <3 WBC/hpf   RBC / HPF 0-2 <3 RBC/hpf   Bacteria, UA FEW (A) RARE   Urine-Other MUCOUS PRESENT   POC urine preg, ED (not at [facility])     Status: None   Collection Time: 04/07/14 11:31 PM  Result Value Ref Range   Preg Test, Ur NEGATIVE NEGATIVE    Comment:        THE SENSITIVITY OF THIS METHODOLOGY IS >24 mIU/mL  Labs are reviewed and are pertinent for medical issues being treated. .  Current Facility-Administered Medications  Medication Dose Route Frequency Provider Last Rate Last Dose  . chlorproMAZINE (THORAZINE) tablet 50 mg  50 mg Oral TID Levonne Spiller, MD   50 mg at 04/08/14 1006  . divalproex (DEPAKOTE ER) 24 hr tablet 500 mg  500 mg Oral BID  Levonne Spiller, MD   500 mg at 04/08/14 1006  . doxycycline (VIBRA-TABS) tablet 100 mg  100 mg Oral Q12H Pamella Pert, MD   100 mg at 04/08/14 1007  . LORazepam (ATIVAN) tablet 1 mg  1 mg Oral Q4H PRN Levonne Spiller, MD       Or  . LORazepam (ATIVAN) injection 1 mg  1 mg Intramuscular Q4H PRN Levonne Spiller, MD       Current Outpatient Prescriptions  Medication Sig Dispense Refill  . divalproex (DEPAKOTE ER) 500 MG 24 hr tablet Take 2 tablets (1,000 mg total) by mouth 2 (two) times daily after a meal. For mood control 120 tablet 0  . haloperidol (HALDOL) 10 MG tablet Take 1 tablet (10 mg total) by mouth daily. 30 tablet 0  . haloperidol (HALDOL) 5 MG tablet Take 1 tablet (5 mg total) by mouth at bedtime. 30 tablet 0  . hydrOXYzine (ATARAX/VISTARIL) 50 MG tablet Take 1 tablet (50 mg total) by mouth at bedtime. (Patient not taking: Reported on 04/05/2014) 30 tablet 0  . LORazepam (ATIVAN) 1 MG tablet Take 1 tablet (1 mg total) by mouth 2 (two) times daily. (Patient not taking: Reported on 04/05/2014) 30 tablet 0  . Prenatal Vit-Fe Fumarate-FA (PRENATAL MULTIVITAMIN) TABS tablet Take 1 tablet by mouth daily at 12 noon. (Patient not taking: Reported on 04/05/2014)    . traZODone (DESYREL) 100 MG tablet Take 1 tablet (100 mg total) by mouth at bedtime and may repeat dose one time if needed. 60 tablet 0    Psychiatric Specialty Exam:     Blood pressure 114/66, pulse 64, temperature 98.3 F (36.8 C), temperature source Oral, resp. rate 20, height 5' 8"  (1.727 m), weight 122.556 kg (270 lb 3 oz), SpO2 99 %.Body mass index is 41.09 kg/(m^2).  General Appearance: Disheveled and Guarded  Eye Contact::  Poor  Speech:  Garbled and Slow  Volume:  Decreased  Mood:  Depressed and Irritable  Affect:  Blunt  Thought Process:  Disorganized and Irrelevant  Orientation:  Full (Time, Place, and Person)  Thought Content:  Delusions, Hallucinations: Auditory and Rumination  Suicidal Thoughts:  No  Homicidal  Thoughts:  No  Memory:  Immediate;   Poor Recent;   Poor Remote;   Poor  Judgement:  Impaired  Insight:  Lacking  Psychomotor Activity:  Decreased  Concentration:  Poor  Recall:  Poor  Fund of Knowledge:Poor  Language: Fair  Akathisia:  No  Handed:  Right  AIMS (if indicated):     Assets:  Communication Skills  Sleep:      Musculoskeletal: Strength & Muscle Tone: within normal limits Gait & Station: normal Patient leans: N/A  Treatment Plan Summary: Daily contact with patient to assess and evaluate symptoms and progress in treatment Medication management recommend inpatient psychiatric hospitalization for stabilization of mood and thought processes.   [place], [place]  04/08/2014 4:48 PM  Patient seen, evaluated and I agree with notes by Nurse Practitioner. Corena Pilgrim, MD

## 2014-04-08 NOTE — ED Notes (Signed)
Pt redirected to room, but continues to stand at front desk.

## 2014-04-09 ENCOUNTER — Encounter (HOSPITAL_COMMUNITY): Payer: Self-pay | Admitting: General Practice

## 2014-04-09 ENCOUNTER — Encounter (HOSPITAL_COMMUNITY): Payer: Self-pay | Admitting: Registered Nurse

## 2014-04-09 ENCOUNTER — Inpatient Hospital Stay (HOSPITAL_COMMUNITY)
Admission: AD | Admit: 2014-04-09 | Discharge: 2014-04-17 | DRG: 885 | Disposition: A | Discharge status: Home / Self Care | Payer: Medicare Other | Source: Intra-hospital | Attending: Psychiatry | Admitting: Psychiatry

## 2014-04-09 DIAGNOSIS — F603 Borderline personality disorder: Secondary | ICD-10-CM | POA: Diagnosis not present

## 2014-04-09 DIAGNOSIS — F29 Unspecified psychosis not due to a substance or known physiological condition: Secondary | ICD-10-CM | POA: Diagnosis not present

## 2014-04-09 DIAGNOSIS — F250 Schizoaffective disorder, bipolar type: Secondary | ICD-10-CM | POA: Diagnosis not present

## 2014-04-09 DIAGNOSIS — Z590 Homelessness: Secondary | ICD-10-CM | POA: Diagnosis not present

## 2014-04-09 DIAGNOSIS — E119 Type 2 diabetes mellitus without complications: Secondary | ICD-10-CM | POA: Diagnosis present

## 2014-04-09 DIAGNOSIS — F200 Paranoid schizophrenia: Secondary | ICD-10-CM | POA: Diagnosis not present

## 2014-04-09 DIAGNOSIS — F21 Schizotypal disorder: Secondary | ICD-10-CM | POA: Diagnosis present

## 2014-04-09 DIAGNOSIS — R4183 Borderline intellectual functioning: Secondary | ICD-10-CM | POA: Diagnosis not present

## 2014-04-09 LAB — GC/CHLAMYDIA PROBE AMP
CT Probe RNA: NEGATIVE
GC Probe RNA: NEGATIVE

## 2014-04-09 MED ORDER — HALOPERIDOL 5 MG PO TABS
5.0000 mg | ORAL_TABLET | Freq: Four times a day (QID) | ORAL | Status: DC | PRN
  Administered 2014-04-09: 5 mg via ORAL
  Filled 2014-04-09: qty 1

## 2014-04-09 MED ORDER — TRAZODONE HCL 100 MG PO TABS
100.0000 mg | ORAL_TABLET | Freq: Every day | ORAL | Status: DC
  Administered 2014-04-09 – 2014-04-16 (×7): 100 mg via ORAL
  Filled 2014-04-09 (×10): qty 1
  Filled 2014-04-09 (×2): qty 3

## 2014-04-09 MED ORDER — ACETAMINOPHEN 325 MG PO TABS
650.0000 mg | ORAL_TABLET | Freq: Four times a day (QID) | ORAL | Status: DC | PRN
Start: 2014-04-09 — End: 2014-04-17
  Administered 2014-04-13 – 2014-04-16 (×3): 650 mg via ORAL
  Filled 2014-04-09 (×2): qty 2

## 2014-04-09 MED ORDER — LORAZEPAM 1 MG PO TABS
2.0000 mg | ORAL_TABLET | Freq: Four times a day (QID) | ORAL | Status: DC | PRN
  Administered 2014-04-09 – 2014-04-16 (×10): 2 mg via ORAL
  Filled 2014-04-09 (×10): qty 2

## 2014-04-09 MED ORDER — DOXYCYCLINE HYCLATE 100 MG PO TABS
100.0000 mg | ORAL_TABLET | Freq: Two times a day (BID) | ORAL | Status: AC
  Administered 2014-04-09 – 2014-04-14 (×10): 100 mg via ORAL
  Filled 2014-04-09 (×11): qty 1

## 2014-04-09 MED ORDER — CHLORPROMAZINE HCL 50 MG PO TABS
50.0000 mg | ORAL_TABLET | Freq: Three times a day (TID) | ORAL | Status: DC
  Administered 2014-04-09 – 2014-04-10 (×3): 50 mg via ORAL
  Filled 2014-04-09: qty 1
  Filled 2014-04-09: qty 2
  Filled 2014-04-09 (×3): qty 1
  Filled 2014-04-09: qty 2
  Filled 2014-04-09: qty 1

## 2014-04-09 MED ORDER — MAGNESIUM HYDROXIDE 400 MG/5ML PO SUSP: 30.0000 mL | Freq: Every day | ORAL | Status: DC | PRN

## 2014-04-09 MED ORDER — ALUM & MAG HYDROXIDE-SIMETH 200-200-20 MG/5ML PO SUSP: 30.0000 mL | ORAL | Status: DC | PRN

## 2014-04-09 MED ORDER — DIVALPROEX SODIUM ER 500 MG PO TB24
500.0000 mg | ORAL_TABLET | Freq: Two times a day (BID) | ORAL | Status: DC
  Administered 2014-04-09 – 2014-04-11 (×4): 500 mg via ORAL
  Filled 2014-04-09 (×8): qty 1

## 2014-04-09 MED ORDER — HALOPERIDOL LACTATE 5 MG/ML IJ SOLN: 5.0000 mg | INTRAMUSCULAR | Status: DC | PRN

## 2014-04-09 MED ORDER — NICOTINE POLACRILEX 2 MG MT GUM
2.0000 mg | CHEWING_GUM | OROMUCOSAL | Status: DC | PRN
  Administered 2014-04-09 – 2014-04-14 (×7): 2 mg via ORAL
  Filled 2014-04-09 (×2): qty 1

## 2014-04-09 NOTE — Progress Notes (Signed)
Pt mood very labile, pt did agree to take her medications PO

## 2014-04-09 NOTE — Progress Notes (Signed)
Patient ID: Molly [name], female   DOB: 04/05/1986, 28 y.o.   MRN: [id number] PER STATE REGULATIONS 482.30  THIS CHART WAS REVIEWED FOR MEDICAL NECESSITY WITH RESPECT TO THE PATIENT'S ADMISSION/DURATION OF STAY.  NEXT REVIEW DATE: 04/13/14 Loura HaltBARBARA Joselynn Amoroso, RN, BSN CASE MANAGER

## 2014-04-09 NOTE — [facility] Counselor (Signed)
[facility] because they claimed they never received it.        Cyndie MullAnna Delayna Sparlin, [facility] Triage Specialist

## 2014-04-09 NOTE — Progress Notes (Signed)
D: Pt denies SI/HI/AVH. Pt is argumentative, labile, tangential , and irritable. Pt has one track mind. Pt only focuses on on what she wants to, pt does not process information from Clinical research associatewriter .    A: Pt was offered support and encouragement. Pt was given scheduled medications. Pt was encourage to attend groups. Q 15 minute checks were done for safety.   R:Pt attends groups . Pt is taking medication.Pt receptive to treatment and safety maintained on unit.

## 2014-04-09 NOTE — Progress Notes (Signed)
Patient ID: Levester FreshJessica S Bachar, female   DOB: 25-Mar-1986, 28 y.o.   MRN: [id number]  Admission Note- Per review of chart and speaking with patient. [name] is a 28 year old AA female [facility] stating she was pregnant and her water just broke. [facility]. Patient denies SI/HI and A/V hallucinations. Patient denies pain at this time. Patient has a past medical history of schizophrenia and diabetes (diet controlled), see H&P for complete PMH. Patient was oriented to the unit and verbalized understanding of the admission process. However, patient did not want to answer some of writers questions. Patient was labile during admission. One minute patient was smiling, laughing inappropriately and another moment patient was irritable and guarded. Patient had a small sore on her lower back/sacrum area and reported it does itch sometimes but denied any pain. Patient was given lunch and was shown to her room. Patient is childlike in interaction and labile. Patient went to sleep after eating her lunch. Q15 minute safety checks were initiated and are maintained at this time.

## 2014-04-09 NOTE — Tx Team (Signed)
Initial Interdisciplinary Treatment Plan   PATIENT STRESSORS: Medication change or noncompliance   PATIENT STRENGTHS: General fund of knowledge   PROBLEM LIST: Problem List/Patient Goals Date to be addressed Date deferred Reason deferred Estimated date of resolution  Psychosis 04/09/2014                                                      DISCHARGE CRITERIA:  Improved stabilization in mood, thinking, and/or behavior Motivation to continue treatment in a less acute level of care Verbal commitment to aftercare and medication compliance  PRELIMINARY DISCHARGE PLAN: Outpatient therapy  PATIENT/FAMIILY INVOLVEMENT: This treatment plan has been presented to and reviewed with the patient, Molly [name].  The patient and family have been given the opportunity to ask questions and make suggestions.  Marzetta BoardDopson, Danyal Whitenack E 04/09/2014, 4:27 PM

## 2014-04-09 NOTE — Progress Notes (Signed)
Pt calling the police telling them she wants to go to jail . Pt disrupting the unit stating "why did you give me that pill". Referring to her antibiotic. Pt appears to have a hard time processing information.

## 2014-04-09 NOTE — Consult Note (Signed)
[facility] Face-to-Face Psychiatry Consult   Reason for Consult:  Delusional  Referring Physician:  EDP   Molly [name] is an 28 y.o. female. Total Time spent with patient: 30 minutes  Assessment: AXIS I:  psychosis AXIS II:  Deferred AXIS III:   Past Medical History  Diagnosis Date  . Schizophrenia   . CERVICITIS, GONOCOCCAL, History of 01/09/2007    Qualifier: History of  By: McDiarmid MD, Tawanna Cooler    . ATTENTION DEFICIT, W/O HYPERACTIVITY, History of 06/30/2006    Qualifier: History of  By: McDiarmid MD, Tawanna Cooler    . Depression   . 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 Dennie Bible (Psych) At [facility] in  [place], [place]. Qualifier: Hospitalized for  By: McDiarmid MD, Tawanna Cooler    . 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 TOBACCO USER 02/08/2009    Qualifier: Diagnosis of  By: Knox Royalty    . 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 ASC-cannot exclude HGSIL on Pap 02/15/2012    ASC-US on 02/03/2012 pap (associated Trichomonas infection). No reflex HPV testing performed on specimen.  Patient informed that she will need repeat Pap in one year.      . Diabetes mellitus     diet controlled  . Eczema   . Abnormal Pap smear    AXIS IV:  other psychosocial or environmental problems and problems related to social environment AXIS V:  21-30 behavior considerably influenced by delusions or hallucinations OR serious impairment in judgment, communication OR inability to function in almost all areas  Plan:  Recommend psychiatric Inpatient admission when medically cleared.  Subjective:   Molly [name] is a 28 y.o. female patient admitted with acute psychosis.  Patient presented to [facility]  Stating that her water broke and she was going to have a baby.Marland Kitchen  HPI:  Patient continues to be guarded/paranoid giving minimal information.   Patient states that she has taken pregnancy test and was informed that she was not pregnant; when asked if she believed that she was still pregnant patient stated "I don't know".  Patient denies suicidal/homicidal ideation at this time.  Patient has been compliant with medications while here int he emergency room.      HPI Elements:   Location:  generalized. Quality:  acute. Severity:  severe. Timing:  ongoing. Duration:  exacerbation of chronic illness. Context:  medication noncompliance.  Past Psychiatric History: Past Medical History  Diagnosis Date  . Schizophrenia   . CERVICITIS, GONOCOCCAL, History of 01/09/2007    Qualifier: History of  By: McDiarmid MD, Tawanna Cooler    . ATTENTION DEFICIT, W/O HYPERACTIVITY, History of 06/30/2006    Qualifier: History of  By: McDiarmid MD, Tawanna Cooler    . Depression   . 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 Dennie Bible (Psych) At [facility] in  [place], [place]. Qualifier: Hospitalized for  By: McDiarmid MD, Tawanna Cooler    . 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 TOBACCO USER 02/08/2009    Qualifier: Diagnosis of  By: Knox Royalty    . 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 ASC-cannot exclude HGSIL on  Pap 02/15/2012    ASC-US on 02/03/2012 pap (associated Trichomonas infection). No reflex HPV testing performed on specimen.  Patient informed that she will need repeat Pap in one year.      . Diabetes mellitus     diet controlled  . Eczema   . Abnormal Pap smear     reports that she has been smoking Cigarettes.  She has a 5 pack-year smoking history. She has never used smokeless tobacco. She reports that she drinks alcohol. She reports that she does not use illicit drugs. Family History  Problem Relation Age of Onset  . Adopted: Yes  . Bipolar disorder Sister   . Alcohol abuse Brother   . Cancer  Father   . Diabetes Mother    Family History Substance Abuse:  (UTA) Family Supports:  (UTA) Living Arrangements:  (Homeless) Can pt return to current living arrangement?: Yes   Allergies:   Allergies  Allergen Reactions  . Abilify [Aripiprazole] Other (See Comments)    Thinks its nasty- does not want it    ACT Assessment Complete:  Yes:    Educational Status    Risk to Self: Risk to self with the past 6 months Suicidal Ideation:  (UTA) Suicidal Intent:  (UTA) Is patient at risk for suicide?:  (UTA) Suicidal Plan?:  (UTA) Access to Means:  (UTA) What has been your use of drugs/alcohol within the last 12 months?:  (UTA) Previous Attempts/Gestures:  (OD on medication not sure if this was SI or not ) How many times?:  (unknown) Other Self Harm Risks:  (Homeless, Delusional) Triggers for Past Attempts: Unknown Family Suicide History: Unable to assess Recent stressful life event(s): Other (Comment) (UTA) Persecutory voices/beliefs?:  (UTA) Depression: Yes Substance abuse history and/or treatment for substance abuse?: Yes Suicide prevention information given to non-admitted patients: Not applicable  Risk to Others: Risk to Others within the past 6 months Homicidal Ideation:  (UTA) Thoughts of Harm to Others:  (UTA) Current Homicidal Intent:  (UTA) Current Homicidal Plan:  (UTA ) Access to Homicidal Means:  (UTA) Identified Victim:  (UTA) History of harm to others?:  (UTA) Assessment of Violence: None Noted (UTA) Violent Behavior Description:  (UTA) Does patient have access to weapons?:  (UTA) Criminal Charges Pending?:  (UTA) 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)  Abuse:    Prior Inpatient Therapy: Prior Inpatient Therapy Prior Inpatient Therapy: Yes Prior Therapy Dates:  (Unknown) Prior Therapy Facilty/Provider(s):  (Unknown) Reason for Treatment:  (Delusions, schizophrenia)  Prior Outpatient Therapy: Prior Outpatient Therapy Prior Outpatient Therapy:  (UTA) Prior  Therapy Dates:  (UTA) Prior Therapy Facilty/Provider(s):  (UTA) Reason for Treatment:  (UTA)  Additional Information: Additional Information 1:1 In Past 12 Months?:  (Unknown) CIRT Risk: Yes Elopement Risk: Yes Does patient have medical clearance?: No                  Objective: Blood pressure 118/73, pulse 93, temperature 98.2 F (36.8 C), temperature source Oral, resp. rate 16, height 5\' 8"  (1.727 m), weight 122.556 kg (270 lb 3 oz), SpO2 98 %.Body mass index is 41.09 kg/(m^2). Results for orders placed or performed during the hospital encounter of 04/05/14 (from the past 72 hour(s))  Urine rapid drug screen (hosp performed)     Status: None   Collection Time: 04/07/14 11:22 PM  Result Value Ref Range   Opiates NONE DETECTED NONE DETECTED   Cocaine NONE DETECTED NONE DETECTED   Benzodiazepines NONE DETECTED NONE DETECTED   Amphetamines  NONE DETECTED NONE DETECTED   Tetrahydrocannabinol NONE DETECTED NONE DETECTED   Barbiturates NONE DETECTED NONE DETECTED    Comment:        DRUG SCREEN FOR MEDICAL PURPOSES ONLY.  IF CONFIRMATION IS NEEDED FOR ANY PURPOSE, NOTIFY LAB WITHIN 5 DAYS.        LOWEST DETECTABLE LIMITS FOR URINE DRUG SCREEN Drug Class       Cutoff (ng/mL) Amphetamine      1000 Barbiturate      200 Benzodiazepine   200 Tricyclics       300 Opiates          300 Cocaine          300 THC              50   Urinalysis, Routine w reflex microscopic     Status: Abnormal   Collection Time: 04/07/14 11:30 PM  Result Value Ref Range   Color, Urine YELLOW YELLOW   APPearance CLOUDY (A) CLEAR   Specific Gravity, Urine 1.019 1.005 - 1.030   pH 7.0 5.0 - 8.0   Glucose, UA NEGATIVE NEGATIVE mg/dL   Hgb urine dipstick NEGATIVE NEGATIVE   Bilirubin Urine NEGATIVE NEGATIVE   Ketones, ur NEGATIVE NEGATIVE mg/dL   Protein, ur NEGATIVE NEGATIVE mg/dL   Urobilinogen, UA 1.0 0.0 - 1.0 mg/dL   Nitrite NEGATIVE NEGATIVE   Leukocytes, UA SMALL (A) NEGATIVE  Urine  microscopic-add on     Status: Abnormal   Collection Time: 04/07/14 11:30 PM  Result Value Ref Range   Squamous Epithelial / LPF FEW (A) RARE   WBC, UA 0-2 <3 WBC/hpf   RBC / HPF 0-2 <3 RBC/hpf   Bacteria, UA FEW (A) RARE   Urine-Other MUCOUS PRESENT   POC urine preg, ED (not at [facility])     Status: None   Collection Time: 04/07/14 11:31 PM  Result Value Ref Range   Preg Test, Ur NEGATIVE NEGATIVE    Comment:        THE SENSITIVITY OF THIS METHODOLOGY IS >24 mIU/mL    Labs are reviewed and are pertinent for medical issues being treated. .  Current Facility-Administered Medications  Medication Dose Route Frequency Provider Last Rate Last Dose  . chlorproMAZINE (THORAZINE) tablet 50 mg  50 mg Oral TID Diannia Ruder, MD   50 mg at 04/09/14 1040  . divalproex (DEPAKOTE ER) 24 hr tablet 500 mg  500 mg Oral BID Diannia Ruder, MD   500 mg at 04/09/14 1040  . doxycycline (VIBRA-TABS) tablet 100 mg  100 mg Oral Q12H Purvis Sheffield, MD   100 mg at 04/09/14 1040  . LORazepam (ATIVAN) tablet 1 mg  1 mg Oral Q4H PRN Diannia Ruder, MD   1 mg at 04/08/14 2110   Or  . LORazepam (ATIVAN) injection 1 mg  1 mg Intramuscular Q4H PRN Diannia Ruder, MD      . traZODone (DESYREL) tablet 100 mg  100 mg Oral QHS Kerry Hough, PA-C   100 mg at 04/09/14 1610   Current Outpatient Prescriptions  Medication Sig Dispense Refill  . divalproex (DEPAKOTE ER) 500 MG 24 hr tablet Take 2 tablets (1,000 mg total) by mouth 2 (two) times daily after a meal. For mood control 120 tablet 0  . haloperidol (HALDOL) 10 MG tablet Take 1 tablet (10 mg total) by mouth daily. 30 tablet 0  . haloperidol (HALDOL) 5 MG tablet Take 1 tablet (5 mg total) by mouth at bedtime. 30  tablet 0  . hydrOXYzine (ATARAX/VISTARIL) 50 MG tablet Take 1 tablet (50 mg total) by mouth at bedtime. (Patient not taking: Reported on 04/05/2014) 30 tablet 0  . LORazepam (ATIVAN) 1 MG tablet Take 1 tablet (1 mg total) by mouth 2 (two) times daily. (Patient not  taking: Reported on 04/05/2014) 30 tablet 0  . Prenatal Vit-Fe Fumarate-FA (PRENATAL MULTIVITAMIN) TABS tablet Take 1 tablet by mouth daily at 12 noon. (Patient not taking: Reported on 04/05/2014)    . traZODone (DESYREL) 100 MG tablet Take 1 tablet (100 mg total) by mouth at bedtime and may repeat dose one time if needed. 60 tablet 0    Psychiatric Specialty Exam:     Blood pressure 118/73, pulse 93, temperature 98.2 F (36.8 C), temperature source Oral, resp. rate 16, height 5\' 8"  (1.727 m), weight 122.556 kg (270 lb 3 oz), SpO2 98 %.Body mass index is 41.09 kg/(m^2).  General Appearance: Disheveled and Guarded  Eye Contact::  Poor  Speech:  Garbled and Slow  Volume:  Decreased  Mood:  Depressed, Irritable and Guarded   Affect:  Blunt and Depressed  Thought Process:  Disorganized and Irrelevant  Orientation:  Full (Time, Place, and Person)  Thought Content:  Delusions, Paranoid Ideation and Rumination  Suicidal Thoughts:  No  Homicidal Thoughts:  No  Memory:  Immediate;   Poor Recent;   Poor Remote;   Poor  Judgement:  Impaired  Insight:  Lacking  Psychomotor Activity:  Decreased  Concentration:  Poor  Recall:  Poor  Fund of Knowledge:Poor  Language: Fair  Akathisia:  No  Handed:  Right  AIMS (if indicated):     Assets:  Communication Skills  Sleep:      Musculoskeletal: Strength & Muscle Tone: within normal limits Gait & Station: normal Patient leans: N/A  Treatment Plan Summary: Daily contact with patient to assess and evaluate symptoms and progress in treatment Medication management recommend inpatient psychiatric hospitalization for stabilization of mood and thought processes.    Will continue with current treatment plan for inpatient treatment.  [facility]  Rankin, Denice BorsShuvon, FNP-BC 04/09/2014 10:41 AM   Patient seen, evaluated and I agree with notes by Nurse Practitioner. Thedore MinsMojeed Mahlet Jergens, MD

## 2014-04-09 NOTE — BH Assessment (Signed)
[facility] Assessment Progress Note  [facility] by Thedore MinsMojeed Akintayo, MD.  Per Thurman CoyerEric Kaplan, RN, AC, pt assigned to Rm [id number].  Pt refused to sign Consent to Release Information.  [facility]  Pt's nurse, Minerva Areolaric has been notified.  Doylene Canninghomas Robbyn Hodkinson, MA Triage Specialist 04/09/2014 @ 10:50

## 2014-04-10 ENCOUNTER — Encounter (HOSPITAL_COMMUNITY): Payer: Self-pay | Admitting: Psychiatry

## 2014-04-10 DIAGNOSIS — F250 Schizoaffective disorder, bipolar type: Secondary | ICD-10-CM | POA: Diagnosis present

## 2014-04-10 DIAGNOSIS — F200 Paranoid schizophrenia: Secondary | ICD-10-CM

## 2014-04-10 LAB — TSH: TSH: 1.49 u[IU]/mL (ref 0.350–4.500)

## 2014-04-10 MED ORDER — OLANZAPINE 5 MG PO TBDP
5.0000 mg | ORAL_TABLET | Freq: Three times a day (TID) | ORAL | Status: DC | PRN
  Administered 2014-04-10 – 2014-04-16 (×6): 5 mg via ORAL
  Filled 2014-04-10 (×6): qty 1

## 2014-04-10 MED ORDER — BENZTROPINE MESYLATE 0.5 MG PO TABS
0.5000 mg | ORAL_TABLET | Freq: Two times a day (BID) | ORAL | Status: DC
  Administered 2014-04-10 – 2014-04-17 (×14): 0.5 mg via ORAL
  Filled 2014-04-10 (×2): qty 1
  Filled 2014-04-10: qty 6
  Filled 2014-04-10 (×2): qty 1
  Filled 2014-04-10: qty 6
  Filled 2014-04-10 (×6): qty 1
  Filled 2014-04-10: qty 6
  Filled 2014-04-10 (×4): qty 1
  Filled 2014-04-10: qty 6
  Filled 2014-04-10 (×2): qty 1

## 2014-04-10 MED ORDER — HALOPERIDOL 5 MG PO TABS
5.0000 mg | ORAL_TABLET | Freq: Two times a day (BID) | ORAL | Status: DC
  Administered 2014-04-10 – 2014-04-11 (×2): 5 mg via ORAL
  Filled 2014-04-10 (×6): qty 1

## 2014-04-10 MED ORDER — OLANZAPINE 10 MG IM SOLR: 5.0000 mg | Freq: Three times a day (TID) | INTRAMUSCULAR | Status: DC | PRN

## 2014-04-10 NOTE — Progress Notes (Signed)
Adult Psychoeducational Group Note  Date:  04/10/2014 Time:  10:25 PM  Group Topic/Focus:  Wrap-Up Group:   The focus of this group is to help patients review their daily goal of treatment and discuss progress on daily workbooks.  Participation Level:  Active  Participation Quality:  Appropriate  Affect:  Appropriate  Cognitive:  Appropriate  Insight: Appropriate  Engagement in Group:  Engaged  Modes of Intervention:  Activity  Additional Comments:   Pt attended wrap up group this evening. Pt participate in group. Pt shared with peers about day and pt goals.    Lochlann Mastrangelo A 04/10/2014, 10:25 PM
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
## 2014-04-10 NOTE — Progress Notes (Signed)
D: Pt denies SI/HI/AVH. Pt is pleasant and cooperative. Pt has been appropriate on the unit. Pt appears calm , not argumentative nor appears irritable.   A: Pt was offered support and encouragement. Pt was given scheduled medications. Pt was encourage to attend groups. Q 15 minute checks were done for safety.   R:Pt attends groups and interacts well with peers and staff. Pt is taking medication. Pt has no complaints at this time .Pt receptive to treatment and safety maintained on unit.
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
## 2014-04-10 NOTE — Progress Notes (Signed)
D: Pt resting in bed at present, responds to voices / sounds when approach. OOB earlier this shift, visible in dayroom for long interval then. Observed interacting (conversing) well with peers. Denied pain, A / V hallucinations when assessed. Contracts for safety while hospitalized. A: All medications given as ordered. Verbal encouragement offered to assist pt comply with treatment regimen and to attend to her ADL needs. Support and availability offered. POC continued as per order. Safety maintained on Q 15 minutes checks without behavioral outburst to note at this time. R: Pt declined cold / warm pack at 0900 and 1300 when offered by Clinical research associatewriter. Inappropriate laughter observed during time of medication administration. Remains med compliant. Pt did not attend groups this shift despite multiple prompts. Off unit with peers and staff for lunch in cafeteria, returned to unit without issues. Safety maintained.

## 2014-04-10 NOTE — [facility] Group Notes (Signed)
[facility] [name] Group Therapy  04/10/2014 1:25 PM  Type of Therapy:  Group Therapy  Participation Level:  None  Participation Quality:  Drowsy  Affect:  Flat  Cognitive:  Disorganized  Insight:  Limited  Engagement in Therapy:  Limited  Modes of Intervention:  Discussion, Education, Socialization and Support  Summary of Progress/Problems:Mental [employer] Association (MHA) speaker came to talk about his personal journey with substance abuse and mental illness. Group members were challenged to process ways by which to relate to the speaker. MHA speaker provided handouts and educational information pertaining to groups and services offered by the [facility]. [name] came to group but she left before the end. During her time in group, she was drowsy and quiet.     [name],[name] 04/10/2014, 1:25 PM

## 2014-04-10 NOTE — Tx Team (Signed)
Interdisciplinary Treatment Plan Update (Adult)  Date: 04/10/2014 Time Reviewed: 2:09 PM  Progress in Treatment:  Attending groups: Yes Participating in groups: No   Taking medication as prescribed: Yes  Tolerating medication: Yes  Family/Significant other contact made:No, not yet Patient understands diagnosis: No, limited insight  Discussing patient identified problems/goals with staff: Yes  Medical problems stabilized or resolved: Yes  Denies suicidal/homicidal ideation:Yes  Patient has not harmed self or Others: Yes  New problem(s) identified: N/A Discharge Plan or Barriers: Pt plans to go to a group home and receive outpatient services.  Additional comments: Molly [name] is a 28 year old AA female [facility] stating she was pregnant and her water just broke. [facility]. Patient denies SI/HI and A/V hallucinations. Patient is unwilling to provide much information. One minute patient was smiling, laughing inappropriately and another moment patient was irritable and guarded.  Reason for Continuation of Hospitalization:  Medication Stabilization  Delusions  Mood Lability Estimated length of stay: 4-5 days   Attendees:  Patient:  04/10/2014 2:09 PM   Family:  12/9/20152:09 PM   Physician: Dr. Elna BreslowEappen MD  12/9/20152:09 PM  Nursing: Marzetta Boardhrista Dopson, RN 04/10/2014 2:09 PM  Clinical Social Worker: Daryel Geraldodney Jerran Tappan, [name]  12/9/20152:09 PM  Clinical Social Worker: Charleston Ropesandace [name], CSW Intern 12/9/20152:09 PM  Other:  12/9/20152:09 PM  Other:  12/9/20152:09 PM  Other:  12/9/20152:09 PM  Scribe for Treatment Team:  Charleston Ropesandace [name], CSW Intern 04/10/2014 2:09 PM

## 2014-04-10 NOTE — [facility] Suicide Risk Assessment (Signed)
   Nursing information obtained from:  Patient Demographic factors:  Gay, lesbian, or bisexual orientation, Unemployed Current Mental Status:  NA Loss Factors:  NA Historical Factors:  Impulsivity Risk Reduction Factors:  NA Total Time spent with patient: 45 minutes  CLINICAL FACTORS:   Unstable or Poor Therapeutic Relationship Previous Psychiatric Diagnoses and Treatments  Psychiatric Specialty Exam: Physical Exam  ROS  Blood pressure 102/66, pulse 101, temperature 98.1 F (36.7 C), temperature source Oral, resp. rate 18, height 5\' 8"  (1.727 m), weight 123.378 kg (272 lb), SpO2 98 %.Body mass index is 41.37 kg/(m^2).  General Appearance: Disheveled  Eye Contact::  Poor  Speech:  Normal Rate  Volume:  Decreased  Mood:  Dysphoric  Affect:  Restricted  Thought Process:  Disorganized  Orientation:  Full (Time, Place, and Person)  Thought Content:  Delusions and Paranoid Ideation  Suicidal Thoughts:  No  Homicidal Thoughts:  No  Memory:  Immediate;   Fair Recent;   Fair Remote;   Fair  Judgement:  Impaired  Insight:  Lacking  Psychomotor Activity:  Normal  Concentration:  Poor  Recall:  [employer]Fair  Fund of Knowledge:Fair  Language: Fair  Akathisia:  No  Handed:  Right  AIMS (if indicated):     Assets:  Physical Health  Sleep:  Number of Hours: 6.25   Musculoskeletal: Strength & Muscle Tone: within normal limits Gait & Station: normal Patient leans: N/A  COGNITIVE FEATURES THAT CONTRIBUTE TO RISK:  Closed-mindedness Polarized thinking Thought constriction (tunnel vision)    SUICIDE RISK:   Moderate:  Frequent suicidal ideation with limited intensity, and duration, some specificity in terms of plans, no associated intent, good self-control, limited dysphoria/symptomatology, some risk factors present, and identifiable protective factors, including available and accessible social support.  PLAN OF CARE:Please see H&P.   I certify that inpatient services furnished can  reasonably be expected to improve the patient's condition.  Dinna Severs MD 04/10/2014, 10:12 AM

## 2014-04-10 NOTE — Plan of Care (Signed)
Problem: Ineffective individual coping Goal: STG: Patient will remain free from self harm Outcome: Progressing Q 15 minutes checks maintained for safety without gestures / event of self harm to note at this time.

## 2014-04-10 NOTE — [facility] Group Notes (Signed)
[facility] [name] Discharge Planning Group Note   04/10/2014 12:57 PM  Participation Quality:  Ddi not attend    [address], Molly [name] B

## 2014-04-10 NOTE — Progress Notes (Signed)
Pt declined warm / cold pack as ordered for sacrum area throughout this shift when offered. Verbal encouragement offered to comply with treatment but to no avail.

## 2014-04-10 NOTE — Plan of Care (Signed)
Problem: Ineffective individual coping Goal: STG: Patient will remain free from self harm Outcome: Progressing Pt safe on unit Aeb assessment and documentation Goal: STG:Pt. will utilize relaxation techniques to reduce stress STG: Patient will utilize relaxation techniques to reduce stress levels  Outcome: Not Progressing Pt continues to get upset, but has hard time remaining under control at this time

## 2014-04-11 DIAGNOSIS — R4183 Borderline intellectual functioning: Secondary | ICD-10-CM

## 2014-04-11 MED ORDER — DIVALPROEX SODIUM ER 250 MG PO TB24
750.0000 mg | ORAL_TABLET | Freq: Two times a day (BID) | ORAL | Status: DC
  Administered 2014-04-11 – 2014-04-17 (×11): 750 mg via ORAL
  Filled 2014-04-11 (×2): qty 3
  Filled 2014-04-11: qty 18
  Filled 2014-04-11: qty 3
  Filled 2014-04-11 (×2): qty 18
  Filled 2014-04-11 (×2): qty 3
  Filled 2014-04-11: qty 18
  Filled 2014-04-11 (×9): qty 3

## 2014-04-11 MED ORDER — HALOPERIDOL 5 MG PO TABS
10.0000 mg | ORAL_TABLET | Freq: Two times a day (BID) | ORAL | Status: DC
  Administered 2014-04-11 – 2014-04-17 (×12): 10 mg via ORAL
  Filled 2014-04-11 (×9): qty 2
  Filled 2014-04-11: qty 12
  Filled 2014-04-11 (×2): qty 2
  Filled 2014-04-11 (×2): qty 12
  Filled 2014-04-11 (×3): qty 2
  Filled 2014-04-11: qty 12

## 2014-04-11 MED ORDER — OLANZAPINE 10 MG IM SOLR
INTRAMUSCULAR | Status: AC
Start: 2014-04-11 — End: 2014-04-12
  Filled 2014-04-11: qty 10

## 2014-04-11 MED ORDER — OLANZAPINE 10 MG IM SOLR
10.0000 mg | Freq: Once | INTRAMUSCULAR | Status: AC
  Administered 2014-04-11: 10 mg via INTRAMUSCULAR
  Filled 2014-04-11: qty 10
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
## 2014-04-11 NOTE — Progress Notes (Signed)
Pt did not attend karaoke this evening.  

## 2014-04-11 NOTE — Progress Notes (Signed)
Adult Psychoeducational Group Note  Date:  04/11/2014 Time:  0900  Group Topic/Focus:  Goals Group:   The focus of this group is to help patients establish daily goals to achieve during treatment and discuss how the patient can incorporate goal setting into their daily lives to aide in recovery.  Participation Level:  Did Not Attend  Participation Quality:    Affect:    Cognitive:    Insight:   Engagement in Group:    Modes of Intervention:    Additional Comments:    Carman Auxier L 04/11/2014, 2:33 PM

## 2014-04-11 NOTE — Progress Notes (Signed)
[facility]  04/11/2014 2:23 PM Levester FreshJessica S Kutzer  MRN:  [id number] Subjective: Patient states 'I am OK' Objective: Patient seen and chart reviewed. Patient is a 28 year old AAF with hx of schizoaffective disorder, presents this admission ,after being noncompliant on her medications which resulted in decompensation. Patient however this admission has been more calm ,withdrawn and has depressive symptoms ,this is unlike her previous admission when she was more manic ,agitated on the unit requiring prn medications.  Patient today appears to be quiet child like ,reports wanting to color in her coloring book . Reports some one else took it from her and wanting to have it back. Patient denies any delusional thoughts ,reports "not being pregnant" since the tests came back negative.(She usually presents with delusions of being pregnant.)  She was encouraged to attend groups. She has been compliant on her medications. She denies any SI/HI/AH/VH. Denies side effects.  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   Diagnosis:   DSM5: Primary Psychiatric Diagnosis: Schizoaffective disorder,bipolar type ,multiple episodes ,currently in acute episode   Secondary Psychiatric Diagnosis: Borderline Intellectual functioning   Non Psychiatric Diagnosis: See pmh   Total Time spent with patient: 30 minutes   ADL's:  Intact  Sleep: Fair  Appetite:  Fair   Psychiatric Specialty Exam: Physical Exam  ROS  Blood pressure 102/66, pulse 101, temperature 98.1 F (36.7 C), temperature source Oral, resp. rate 18, height 5\' 8"  (1.727 m), weight 123.378 kg (272 lb), SpO2 98 %.Body mass index is 41.37 kg/(m^2).  General Appearance: Casual  Eye Contact::  Fair  Speech:  Clear and Coherent  Volume:  Normal  Mood:  Depressed  Affect:  Congruent  Thought Process:  Linear  Orientation:  Other:  person and place  Thought Content:   Delusions IMPROVING  Suicidal Thoughts:  No  Homicidal Thoughts:  No  Memory:  Immediate;   Fair Recent;   Fair Remote;   Fair  Judgement:  Fair  Insight:  Shallow  Psychomotor Activity:  Normal  Concentration:  Fair  Recall:  [employer]Fair  Fund of Knowledge:Fair  Language: Good  Akathisia:  No  Handed:  Right  AIMS (if indicated):     Assets:  Communication Skills  Sleep:  Number of Hours: 6.75   Musculoskeletal: Strength & Muscle Tone: within normal limits Gait & Station: normal Patient leans: N/A  Current Medications: Current Facility-Administered Medications  Medication Dose Route Frequency Provider Last Rate Last Dose  . acetaminophen (TYLENOL) tablet 650 mg  650 mg Oral Q6H PRN Shuvon Rankin, NP      . alum & mag hydroxide-simeth (MAALOX/MYLANTA) 200-200-20 MG/5ML suspension 30 mL  30 mL Oral Q4H PRN Shuvon Rankin, NP      . benztropine (COGENTIN) tablet 0.5 mg  0.5 mg Oral BID Fransisca KaufmannLaura Davis, NP   0.5 mg at 04/11/14 1124  . divalproex (DEPAKOTE ER) 24 hr tablet 750 mg  750 mg Oral BID Jomarie LongsSaramma Shishir Krantz, MD      . doxycycline (VIBRA-TABS) tablet 100 mg  100 mg Oral Q12H Shuvon Rankin, NP   100 mg at 04/11/14 1123  . haloperidol (HALDOL) tablet 10 mg  10 mg Oral BID Jomarie LongsSaramma Ataya Murdy, MD      . LORazepam (ATIVAN) tablet 2 mg  2 mg Oral Q6H PRN Kerry HoughSpencer E Simon, PA-C   2 mg at 04/10/14 2120  . magnesium hydroxide (MILK OF MAGNESIA) suspension 30 mL  30 mL Oral Daily  PRN Shuvon Rankin, NP      . nicotine polacrilex (NICORETTE) gum 2 mg  2 mg Oral PRN Jomarie LongsSaramma Juandaniel Manfredo, MD   2 mg at 04/10/14 2005  . OLANZapine (ZYPREXA) injection 5 mg  5 mg Intramuscular Q8H PRN Jomarie LongsSaramma Tyrell Seifer, MD      . OLANZapine zydis (ZYPREXA) disintegrating tablet 5 mg  5 mg Oral Q8H PRN Fransisca KaufmannLaura Davis, NP   5 mg at 04/10/14 2120  . traZODone (DESYREL) tablet 100 mg  100 mg Oral QHS Shuvon Rankin, NP   100 mg at 04/10/14 2120    Lab Results:  Results for orders placed or performed during the hospital encounter of 04/09/14  (from the past 48 hour(s))  TSH     Status: None   Collection Time: 04/09/14  7:41 PM  Result Value Ref Range   TSH 1.490 0.350 - 4.500 uIU/mL    Comment: [facility]    Physical Findings: AIMS: Facial and Oral Movements Muscles of Facial Expression: None, normal Lips and Perioral Area: None, normal Jaw: None, normal Tongue: None, normal,Extremity Movements Upper (arms, wrists, hands, fingers): None, normal Lower (legs, knees, ankles, toes): None, normal, Trunk Movements Neck, shoulders, hips: None, normal, Overall Severity Severity of abnormal movements (highest score from questions above): None, normal Incapacitation due to abnormal movements: None, normal Patient's awareness of abnormal movements (rate only patient's report): No Awareness, Dental Status Current problems with teeth and/or dentures?: No Does patient usually wear dentures?: No  CIWA:    COWS:     Treatment Plan Summary: Daily contact with patient to assess and evaluate symptoms and progress in treatment Medication management  Plan:  Will continue Haldol ,but will increase the dose to 10 mg po bid ,with Cogentin 0.5 mg po bid. Plan to give Haldol decanoate 100 mg IM prior to discharge. Patient was offered this and she agrees with it. Will increase Depakote to 750 mg po bid. Depakote level on 04/15/14. Will continue Trazodone 100 mg po qhs for sleep. Zyprexa Zydis prn for severe anxiety and agitation. Patient to participate in therapeutic milieu. CSW will work on disposition.    Medical Decision Making Problem Points:  Established problem, stable/improving (1), Review of last therapy session (1) and Review of psycho-social stressors (1) Data Points:  Order Aims Assessment (2) Review or order medicine tests (1) Review and summation of old records (2) Review of medication regiment & side effects (2) Review of new medications or change in dosage (2)  I certify that inpatient services  furnished can reasonably be expected to improve the patient's condition.   Liadan Guizar MD 04/11/2014, 2:23 PM

## 2014-04-11 NOTE — Progress Notes (Signed)
Psychoeducational Group Note  Date:  04/11/2014 Time:  1125  Group Topic/Focus:  Building Self Esteem:   The Focus of this group is helping patients become aware of the effects of self-esteem on their lives, the things they and others do that enhance or undermine their self-esteem, seeing the relationship between their level of self-esteem and the choices they make and learning ways to enhance self-esteem.  Participation Level: Did Not Attend  Participation Quality:  Not Applicable  Affect:  Not Applicable  Cognitive:  Not Applicable  Insight:  Not Applicable  Engagement in Group: Not Applicable  Additional Comments:  Pt refused to attend group this morning.  Kaydynce Pat E 04/11/2014, 11:27 AM

## 2014-04-11 NOTE — [facility] Group Notes (Signed)
[facility] [name] Group Therapy  04/11/2014 11:59 AM   Type of Therapy:  Group Therapy  Participation Level:  Active  Participation Quality:  Attentive  Affect:  Appropriate  Cognitive:  Appropriate  Insight:  Improving  Engagement in Therapy:  Engaged  Modes of Intervention:  Clarification, Education, Exploration and Socialization  Summary of Progress/Problems: Today's group focused on relapse prevention.  We defined the term, and then brainstormed on ways to prevent relapse.  Left soon after group started.  Dr brought her back.  Sat quietly, periodically making exasperated noises.  When asked directly about relapse, responded, but her reply was undecipherble because her hand was in infront of her mouth.  She declined to re state.  Left soon after that.  Daryel Geraldorth, Caylen Kuwahara B 04/11/2014 , 11:59 AM

## 2014-04-12 MED ORDER — BACITRACIN-NEOMYCIN-POLYMYXIN OINTMENT TUBE
TOPICAL_OINTMENT | Freq: Two times a day (BID) | CUTANEOUS | Status: DC
Start: 2014-04-12 — End: 2014-04-13
  Administered 2014-04-12: 1 via TOPICAL
  Administered 2014-04-13: 09:00:00 via TOPICAL
  Filled 2014-04-12: qty 15

## 2014-04-12 NOTE — [facility] Group Notes (Signed)
[name]   04/12/2014 10:28 AM  Participation Quality:  Wandered in and out of group several times.  Did not participate.    Daryel GeraldNorth, Rasheem Figiel B

## 2014-04-12 NOTE — Clinical Social Work Note (Signed)
Care coordinator requested from [facility] to assist w patient discharge planning.  [facility], case will be reviewed Monday and care coordinator assigned if appropriate.  Santa GeneraAnne Jerremy Maione, [name] Clinical Social Worker

## 2014-04-12 NOTE — Progress Notes (Signed)
D.  Pt very childlike, extremely labile.  Pt intrusive, went in another Pt's doorway and threw popcorn in her room.  Other Pt extremely upset.  [name] when confronted by staff about behavior became loud and began using foul language.  Pt has no boundaries and must be constantly redirected much like a small child.  Pt expresses feelings of persecution when inappropriate behavior is redirected.  Pt must be constantly watched to be sure she does not go in other patient's rooms.  Charge RN was able to get Pt to take PO medications, may need IM at later time if behavior continues to esculate.  A.  Support and encouragement offered, constant redirection by staff.  R.  Pt remains restless, pacing on unit.  Will continue to monitor.

## 2014-04-12 NOTE — [facility] Group Notes (Signed)
[facility] [name] Group Therapy  04/12/2014  1:05 PM  Type of Therapy:  Group therapy  Participation Level:  Active  Participation Quality:  Attentive  Affect:  Flat  Cognitive:  Oriented  Insight:  Limited  Engagement in Therapy:  Limited  Modes of Intervention:  Discussion, Socialization  Summary of Progress/Problems:  Chaplain was here to lead a group on themes of hope and courage.  Invited [name] to come in to group before it started.  She declined.  Walked in 8 minutes after we started.  I dismissed her.  Daryel Geraldorth, Tanja Gift B 04/12/2014 2:10 PM

## 2014-04-12 NOTE — Plan of Care (Signed)
Problem: Ineffective individual coping Goal: STG: Patient will remain free from self harm Outcome: Progressing Pt has not harmed herself this shift.  She verbally contracted for safety.       

## 2014-04-12 NOTE — Progress Notes (Signed)
D: Pt has labile affect and mood.  When asked what her goal was for the day, pt stated "I don't remember my goal.  It has been a good day."  Pt smiles and laughs inappropriately at times.  Pt was sitting in front of the phone in the day room and when peer asked pt to move so they could use the phone, pt refused.  Pt did not attend evening group.  Pt has body odor and did not shower this evening, despite encouragement from staff.  Pt had one episode of emesis and stated "I don't want any medication."  Pt provided with ginger ale.   A: Pt received PRN medication for anxiety and agitation, see flowsheet.  Later in shift, pt remained agitated and requested IM medication.  On-call provider notified and Zyprexa 10mg  IMx1 ordered and administered.  Pt cooperative with IM administration.  Safety maintained.  Supported and encouraged pt.   R: Pt is compliant with medications.  She reports she will notify staff of needs and concerns.  Pt verbally contracts for safety.  Will continue to monitor and assess for safety.

## 2014-04-12 NOTE — Progress Notes (Signed)
D: Pt presents with poor hygiene and strong body odor. Writer encouraged pt to bathe and pt stated that she bathe yesterday and that she will bathe when she is ready. Pt have difficulty following directions and becomes agitated when prompted to performed ADLs. Writer offered pt a gown, towels and toiletries. Pt became agitated and labile with Clinical research associatewriter. Pt has poor insight and requires redirecting by staff for inappropriate behaviors. Pt is childlike and feels a sense of entitlement. Pt thoughts are disorganized and pt appears to have difficulty processing information.  A: Medications administered as ordered per MD. Verbal support given. Pt redirected by staff as needed. 15 minute checks performed for safety.  R: Pt loud, rude, verbally aggressive and manipulative.    Writer encouraged pt to bathe this afternoon. Pt agreed to bathe today around 1530. Pt was observed bathing in the tub room

## 2014-04-12 NOTE — Progress Notes (Signed)
[facility]  04/12/2014 4:49 PM [name]  MRN:  [id number] Subjective: Patient states 'I am OK' Objective: Patient seen and chart reviewed. Patient is a 28 year old AAF with hx of schizoaffective disorder, presents this admission ,after being noncompliant on her medications which resulted in decompensation. Patient however this admission has been more calm ,withdrawn and has depressive symptoms ,this is unlike her previous admission when she was more manic ,agitated on the unit requiring prn medications.  Patient today appears to be disheveled ,withdrawn. Per staff patient has been quiet intrusive on the unit ,with periods of irritability ,required prn medications last night.   She was encouraged to attend groups. She has been compliant on her medications. She denies any SI/HI/AH/VH. Denies side effects.  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 CSW will work on Print production plannerpayee/placement.   Diagnosis:   DSM5: Primary Psychiatric Diagnosis: Schizoaffective disorder,bipolar type ,multiple episodes ,currently in acute episode   Secondary Psychiatric Diagnosis: Borderline Intellectual functioning   Non Psychiatric Diagnosis: See pmh   Total Time spent with patient: 30 minutes   ADL's:  Intact  Sleep: Fair  Appetite:  Fair   Psychiatric Specialty Exam: Physical Exam  ROS  Blood pressure 102/66, pulse 101, temperature 98.1 F (36.7 C), temperature source Oral, resp. rate 18, height 5\' 8"  (1.727 m), weight 123.378 kg (272 lb), SpO2 98 %.Body mass index is 41.37 kg/(m^2).  General Appearance: Casual  Eye Contact::  Fair  Speech:  Slow  Volume:  Normal  Mood:  Depressed  Affect:  Blunt  Thought Process:  Linear  Orientation:  Other:  person and place  Thought Content:  Delusions IMPROVING  Suicidal Thoughts:  No  Homicidal Thoughts:  No  Memory:  Immediate;   Fair Recent;   Fair Remote;   Fair   Judgement:  Fair  Insight:  Shallow  Psychomotor Activity:  Normal  Concentration:  Fair  Recall:  [employer]Fair  Fund of Knowledge:Fair  Language: Good  Akathisia:  No  Handed:  Right  AIMS (if indicated):     Assets:  Communication Skills  Sleep:  Number of Hours: 5.5   Musculoskeletal: Strength & Muscle Tone: within normal limits Gait & Station: normal Patient leans: N/A  Current Medications: Current Facility-Administered Medications  Medication Dose Route Frequency Provider Last Rate Last Dose  . acetaminophen (TYLENOL) tablet 650 mg  650 mg Oral Q6H PRN Shuvon Rankin, NP      . alum & mag hydroxide-simeth (MAALOX/MYLANTA) 200-200-20 MG/5ML suspension 30 mL  30 mL Oral Q4H PRN Shuvon Rankin, NP      . benztropine (COGENTIN) tablet 0.5 mg  0.5 mg Oral BID Fransisca KaufmannLaura Davis, NP   0.5 mg at 04/12/14 0948  . divalproex (DEPAKOTE ER) 24 hr tablet 750 mg  750 mg Oral BID Jomarie LongsSaramma Paisleigh Maroney, MD   750 mg at 04/12/14 0947  . doxycycline (VIBRA-TABS) tablet 100 mg  100 mg Oral Q12H Shuvon Rankin, NP   100 mg at 04/12/14 0948  . haloperidol (HALDOL) tablet 10 mg  10 mg Oral BID Jomarie LongsSaramma Ryleeann Urquiza, MD   10 mg at 04/12/14 0948  . LORazepam (ATIVAN) tablet 2 mg  2 mg Oral Q6H PRN Kerry HoughSpencer E Simon, PA-C   2 mg at 04/12/14 1324  . magnesium hydroxide (MILK OF MAGNESIA) suspension 30 mL  30 mL Oral Daily PRN Shuvon Rankin, NP      . neomycin-bacitracin-polymyxin (NEOSPORIN) ointment   Topical BID  Jomarie LongsSaramma Keeanna Villafranca, MD      . nicotine polacrilex (NICORETTE) gum 2 mg  2 mg Oral PRN Jomarie LongsSaramma Hamsa Laurich, MD   2 mg at 04/11/14 1927  . OLANZapine (ZYPREXA) injection 5 mg  5 mg Intramuscular Q8H PRN Jomarie LongsSaramma Bryceson Grape, MD      . OLANZapine zydis (ZYPREXA) disintegrating tablet 5 mg  5 mg Oral Q8H PRN Fransisca KaufmannLaura Davis, NP   5 mg at 04/11/14 2112  . traZODone (DESYREL) tablet 100 mg  100 mg Oral QHS Shuvon Rankin, NP   100 mg at 04/11/14 2112    Lab Results:  No results found for this or any previous visit (from the past 48  hour(s)).  Physical Findings: AIMS: Facial and Oral Movements Muscles of Facial Expression: None, normal Lips and Perioral Area: None, normal Jaw: None, normal Tongue: None, normal,Extremity Movements Upper (arms, wrists, hands, fingers): None, normal Lower (legs, knees, ankles, toes): None, normal, Trunk Movements Neck, shoulders, hips: None, normal, Overall Severity Severity of abnormal movements (highest score from questions above): None, normal Incapacitation due to abnormal movements: None, normal Patient's awareness of abnormal movements (rate only patient's report): No Awareness, Dental Status Current problems with teeth and/or dentures?: No Does patient usually wear dentures?: No  CIWA:    COWS:     Treatment Plan Summary: Daily contact with patient to assess and evaluate symptoms and progress in treatment Medication management  Plan:  Will continue Haldol  10 mg po bid ,with Cogentin 0.5 mg po bid. Plan to give Haldol decanoate 100 mg IM prior to discharge. Patient was offered this and she agrees with it. Will continue Depakote to 750 mg po bid. Depakote level on 04/15/14. Will continue Trazodone 100 mg po qhs for sleep. Zyprexa Zydis prn for severe anxiety and agitation. Patient to participate in therapeutic milieu. CSW will work on disposition.    Medical Decision Making Problem Points:  Established problem, stable/improving (1), Review of last therapy session (1) and Review of psycho-social stressors (1) Data Points:  Order Aims Assessment (2) Review or order medicine tests (1) Review and summation of old records (2) Review of medication regiment & side effects (2) Review of new medications or change in dosage (2)  I certify that inpatient services furnished can reasonably be expected to improve the patient's condition.   Hiral Lukasiewicz MD 04/12/2014, 4:49 PM

## 2014-04-12 NOTE — Progress Notes (Signed)
Did not attend group 

## 2014-04-12 NOTE — Progress Notes (Signed)
D: Pt presents with poor hygiene and strong body odor. Writer encouraged pt to bathe and pt stated that she bathe yesterday and that she will bathe when she is ready. Pt have difficulty following directions and becomes agitated when prompted to performed ADLs. Writer offered pt a gown, towels and toiletries. Pt became agitated and labile with Clinical research associatewriter. Pt has poor insight and requires redirecting by staff for inappropriate behaviors. Pt is childlike and feels a sense of entitlement. Pt thoughts are disorganized and pt appears to have difficulty processing information.  A: Medications administered as ordered per MD. Verbal support given. Pt redirected by staff as needed. 15 minute checks performed for safety.  R: Pt loud, rude, verbally aggressive and manipulative.

## 2014-04-13 DIAGNOSIS — F603 Borderline personality disorder: Secondary | ICD-10-CM

## 2014-04-13 DIAGNOSIS — F250 Schizoaffective disorder, bipolar type: Principal | ICD-10-CM

## 2014-04-13 MED ORDER — MUPIROCIN 2 % EX OINT
TOPICAL_OINTMENT | Freq: Two times a day (BID) | CUTANEOUS | Status: DC
  Administered 2014-04-13: 20:00:00 via TOPICAL
  Administered 2014-04-14: 1 via TOPICAL
  Administered 2014-04-14 – 2014-04-15 (×2): via TOPICAL
  Filled 2014-04-13: qty 22

## 2014-04-13 NOTE — Progress Notes (Signed)
The focus of this group is to help patients review their daily goal of treatment and discuss progress on daily workbooks. Pt did not attend the evening group. 
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
## 2014-04-13 NOTE — Progress Notes (Signed)
Patient ID: Molly FreshJessica S [name], female   DOB: 04-27-1986, 28 y.o.   MRN: [id number] [facility]  04/13/2014 3:04 PM Molly [name]  MRN:  [id number] Subjective: Molly [name] reports, "My mood is stable'  Objective: Molly [name] is seen and chart reviewed. She presents today laughing inappropriately. She says she learned that her credit card has been shipped to be delivered to her while in the hospital by the [employer]. She adds that she is having temper tantrum today because she has not had sex in a long time. Molly [name] is informed and redirected that that kind of activity will not and does not happen in this hospital. 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 CSW will work on Print production plannerpayee/placement.  Diagnosis:   DSM5: Primary Psychiatric Diagnosis: Schizoaffective disorder,bipolar type ,multiple episodes ,currently in acute episode  Secondary Psychiatric Diagnosis: Borderline Intellectual functioning  Non Psychiatric Diagnosis: See pmh  Total Time spent with patient: 30 minutes   ADL's:  Intact  Sleep: Fair  Appetite:  Fair   Psychiatric Specialty Exam: Physical Exam  ROS  Blood pressure 102/66, pulse 101, temperature 98.1 F (36.7 C), temperature source Oral, resp. rate 18, height 5\' 8"  (1.727 m), weight 123.378 kg (272 lb), SpO2 98 %.Body mass index is 41.37 kg/(m^2).  General Appearance: Casual  Eye Contact::  Fair  Speech:  Slow  Volume:  Normal  Mood:  "Stable"  Affect:  Blunt  Thought Process:  Linear  Orientation:  Other:  person and place  Thought Content:  Delusions,  IMPROVING  Suicidal Thoughts:  No  Homicidal Thoughts:  No  Memory:  Immediate;   Fair Recent;   Fair Remote;   Fair  Judgement:  Fair  Insight:  Shallow  Psychomotor Activity:  Normal  Concentration:  Fair  Recall:  [employer]Fair  Fund of Knowledge:Fair  Language: Good  Akathisia:  No   Handed:  Right  AIMS (if indicated):     Assets:  Communication Skills  Sleep:  Number of Hours: 6.75   Musculoskeletal: Strength & Muscle Tone: within normal limits Gait & Station: normal Patient leans: N/A  Current Medications: Current Facility-Administered Medications  Medication Dose Route Frequency Provider Last Rate Last Dose  . acetaminophen (TYLENOL) tablet 650 mg  650 mg Oral Q6H PRN Shuvon Rankin, NP   650 mg at 04/13/14 0955  . alum & mag hydroxide-simeth (MAALOX/MYLANTA) 200-200-20 MG/5ML suspension 30 mL  30 mL Oral Q4H PRN Shuvon Rankin, NP      . benztropine (COGENTIN) tablet 0.5 mg  0.5 mg Oral BID Fransisca KaufmannLaura Davis, NP   0.5 mg at 04/13/14 0912  . divalproex (DEPAKOTE ER) 24 hr tablet 750 mg  750 mg Oral BID Jomarie LongsSaramma Eappen, MD   750 mg at 04/13/14 0913  . doxycycline (VIBRA-TABS) tablet 100 mg  100 mg Oral Q12H Shuvon Rankin, NP   100 mg at 04/13/14 0912  . haloperidol (HALDOL) tablet 10 mg  10 mg Oral BID Jomarie LongsSaramma Eappen, MD   10 mg at 04/13/14 0911  . LORazepam (ATIVAN) tablet 2 mg  2 mg Oral Q6H PRN Kerry HoughSpencer E Simon, PA-C   2 mg at 04/12/14 2115  . magnesium hydroxide (MILK OF MAGNESIA) suspension 30 mL  30 mL Oral Daily PRN Shuvon Rankin, NP      . mupirocin ointment (BACTROBAN) 2 %   Topical BID Jomarie LongsSaramma Eappen, MD      . nicotine  polacrilex (NICORETTE) gum 2 mg  2 mg Oral PRN Jomarie LongsSaramma Eappen, MD   2 mg at 04/11/14 1927  . OLANZapine (ZYPREXA) injection 5 mg  5 mg Intramuscular Q8H PRN Jomarie LongsSaramma Eappen, MD      . OLANZapine zydis (ZYPREXA) disintegrating tablet 5 mg  5 mg Oral Q8H PRN Fransisca KaufmannLaura Davis, NP   5 mg at 04/12/14 2115  . traZODone (DESYREL) tablet 100 mg  100 mg Oral QHS Shuvon Rankin, NP   100 mg at 04/12/14 2114    Lab Results:  No results found for this or any previous visit (from the past 48 hour(s)).  Physical Findings: AIMS: Facial and Oral Movements Muscles of Facial Expression: None, normal Lips and Perioral Area: None, normal Jaw: None, normal Tongue:  None, normal,Extremity Movements Upper (arms, wrists, hands, fingers): None, normal Lower (legs, knees, ankles, toes): None, normal, Trunk Movements Neck, shoulders, hips: None, normal, Overall Severity Severity of abnormal movements (highest score from questions above): None, normal Incapacitation due to abnormal movements: None, normal Patient's awareness of abnormal movements (rate only patient's report): No Awareness, Dental Status Current problems with teeth and/or dentures?: No Does patient usually wear dentures?: No  CIWA:    COWS:     Treatment Plan Summary: Daily contact with patient to assess and evaluate symptoms and progress in treatment Medication management  Plan: Will continue Haldol  10 mg po bid, with Cogentin 0.5 mg po bid. Plan to give Haldol decanoate 100 mg IM prior to discharge. Patient was offered this and she agrees to it. Continueontinue Depakote to 750 mg po bid. Depakote level be drawn on 04/15/14. Will continue Trazodone 100 mg po qhs for sleep. Zyprexa Zydis prn for severe anxiety and agitation. Patient to participate in therapeutic milieu. CSW will work on disposition.  Medical Decision Making Problem Points:  Established problem, stable/improving (1), Review of last therapy session (1) and Review of psycho-social stressors (1) Data Points:  Order Aims Assessment (2) Review or order medicine tests (1) Review and summation of old records (2) Review of medication regiment & side effects (2) Review of new medications or change in dosage (2)  I certify that inpatient services furnished can reasonably be expected to improve the patient's condition.   Sanjuana Kavawoko, Agnes I, PMHNP 04/13/2014, 3:04 PM I agree with assessment and plan Madie Renorving A. Dub MikesLugo, M.D.

## 2014-04-13 NOTE — [facility] Group Notes (Signed)
[facility] Group Notes:  Healthy coping skills  Date:  04/13/2014  Time:  10:14 AM  Type of Therapy:  Nurse Education  Participation Level:  Minimal  Participation Quality:  Inattentive  Affect:  Flat  Cognitive:  Lacking  Insight:  Limited  Engagement in Group:  Limited  Modes of Intervention:  Discussion  Summary of Progress/Problems:  Nicole CellaWebb, Dalissa Lovin Guyes 04/13/2014, 10:14 AM

## 2014-04-13 NOTE — Progress Notes (Signed)
D:Pt's room smelled bad this morning with popcorn and trash scattered throughout the room. Pt was irritable and guarded not wanting to get out of bed. Pt is very childlike and limited with intrusive behaviors. Pt referred to her blanket as her babies. A:Assisted pt to get clothes washed. Room was cleaned by environmental staff. Offered support and 15 minute checks. R:Pt denies si and hi. Safety maintained on the unit.

## 2014-04-14 MED ORDER — BOOST / RESOURCE BREEZE PO LIQD
1.0000 | Freq: Three times a day (TID) | ORAL | Status: DC
  Administered 2014-04-14 – 2014-04-17 (×7): 1 via ORAL
  Filled 2014-04-14 (×15): qty 1

## 2014-04-14 MED ORDER — NICOTINE 21 MG/24HR TD PT24
21.0000 mg | MEDICATED_PATCH | Freq: Every day | TRANSDERMAL | Status: DC
  Administered 2014-04-14 – 2014-04-15 (×2): 21 mg via TRANSDERMAL
  Filled 2014-04-14 (×6): qty 1

## 2014-04-14 NOTE — Plan of Care (Signed)
Problem: Alteration in thought process Goal: STG-Patient is able to sleep at least 6 hours per night Outcome: Progressing According to flowsheet, pt slept over 6 hours last night.

## 2014-04-14 NOTE — Plan of Care (Signed)
Problem: Ineffective individual coping Goal: STG: Patient will remain free from self harm Outcome: Progressing Pt denies any S/I today she does contract verbally to come to staff before acting on any self-harm thoughts.

## 2014-04-14 NOTE — Progress Notes (Signed)
Patient ID: Molly [name] Kristensen, female   DOB: 1985-08-06, 28 y.o.   MRN: [id number] Patient ID: Molly [name] Holleran, female   DOB: 1985-08-06, 28 y.o.   MRN: [id number] [facility]  04/14/2014 2:15 PM Molly [name] Buller  MRN:  [id number] Subjective: "Do you have any lipstick, any makeup"  Objective: [name] is seen and chart reviewed. She presents today walking the hallways.  She is also fixated on asking most staff that pass by her is she could get some make-up.  Informed that there was none available.  Per nursing, she is complaint taking her meds.  She was observed to dance in the hallway.  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 CSW will work on Print production plannerpayee/placement.  Diagnosis:   DSM5: Primary Psychiatric Diagnosis: Schizoaffective disorder,bipolar type ,multiple episodes ,currently in acute episode  Secondary Psychiatric Diagnosis: Borderline Intellectual functioning  Non Psychiatric Diagnosis: See pmh  Total Time spent with patient: 30 minutes   ADL'[name]:  Intact  Sleep: Fair  Appetite:  Fair   Psychiatric Specialty Exam: Physical Exam  ROS  Blood pressure 103/66, pulse 91, temperature 99.6 F (37.6 C), temperature source Oral, resp. rate 18, height 5\' 8"  (1.727 m), weight 123.378 kg (272 lb), SpO2 98 %.Body mass index is 41.37 kg/(m^2).  General Appearance: Casual  Eye Contact::  Fair  Speech:  Slow  Volume:  Normal  Mood:  "Stable"  Affect:  Blunt  Thought Process:  Linear  Orientation:  Other:  person and place  Thought Content:  Delusions,  IMPROVING  Suicidal Thoughts:  No  Homicidal Thoughts:  No  Memory:  Immediate;   Fair Recent;   Fair Remote;   Fair  Judgement:  Fair  Insight:  Shallow  Psychomotor Activity:  Normal  Concentration:  Fair  Recall:  [employer]Fair  Fund of Knowledge:Fair  Language: Good  Akathisia:  No  Handed:  Right  AIMS  (if indicated):     Assets:  Communication Skills  Sleep:  Number of Hours: 4   Musculoskeletal: Strength & Muscle Tone: within normal limits Gait & Station: normal Patient leans: N/A  Current Medications: Current Facility-Administered Medications  Medication Dose Route Frequency Provider Last Rate Last Dose  . acetaminophen (TYLENOL) tablet 650 mg  650 mg Oral Q6H PRN Shuvon Rankin, NP   650 mg at 04/14/14 [id number]  . alum & mag hydroxide-simeth (MAALOX/MYLANTA) 200-200-20 MG/5ML suspension 30 mL  30 mL Oral Q4H PRN Shuvon Rankin, NP      . benztropine (COGENTIN) tablet 0.5 mg  0.5 mg Oral BID Fransisca KaufmannLaura Davis, NP   0.5 mg at 04/14/14 1107  . divalproex (DEPAKOTE ER) 24 hr tablet 750 mg  750 mg Oral BID Jomarie LongsSaramma Eappen, MD   750 mg at 04/14/14 1107  . feeding supplement (RESOURCE BREEZE) (RESOURCE BREEZE) liquid 1 Container  1 Container Oral TID BM Lindwood QuaSheila May Agustin, NP   1 Container at 04/14/14 1357  . haloperidol (HALDOL) tablet 10 mg  10 mg Oral BID Jomarie LongsSaramma Eappen, MD   10 mg at 04/14/14 1106  . LORazepam (ATIVAN) tablet 2 mg  2 mg Oral Q6H PRN Kerry HoughSpencer E Simon, PA-C   2 mg at 04/13/14 2045  . magnesium hydroxide (MILK OF MAGNESIA) suspension 30 mL  30 mL Oral Daily PRN Shuvon Rankin, NP      . mupirocin ointment (BACTROBAN) 2 %   Topical BID Saramma  Eappen, MD   1 application at 04/14/14 1106  . nicotine polacrilex (NICORETTE) gum 2 mg  2 mg Oral PRN Jomarie LongsSaramma Eappen, MD   2 mg at 04/14/14 [id number]  . OLANZapine (ZYPREXA) injection 5 mg  5 mg Intramuscular Q8H PRN Jomarie LongsSaramma Eappen, MD      . OLANZapine zydis (ZYPREXA) disintegrating tablet 5 mg  5 mg Oral Q8H PRN Fransisca KaufmannLaura Davis, NP   5 mg at 04/13/14 2046  . traZODone (DESYREL) tablet 100 mg  100 mg Oral QHS Shuvon Rankin, NP   100 mg at 04/13/14 2045    Lab Results:  No results found for this or any previous visit (from the past 48 hour([name])).  Physical Findings: AIMS: Facial and Oral Movements Muscles of Facial Expression: None, normal Lips and  Perioral Area: None, normal Jaw: None, normal Tongue: None, normal,Extremity Movements Upper (arms, wrists, hands, fingers): None, normal Lower (legs, knees, ankles, toes): None, normal, Trunk Movements Neck, shoulders, hips: None, normal, Overall Severity Severity of abnormal movements (highest score from questions above): None, normal Incapacitation due to abnormal movements: None, normal Patient'[name] awareness of abnormal movements (rate only patient'[name] report): No Awareness, Dental Status Current problems with teeth and/or dentures?: No Does patient usually wear dentures?: No  CIWA:    COWS:     Treatment Plan Summary: Daily contact with patient to assess and evaluate symptoms and progress in treatment Medication management  Plan: Will continue Haldol  10 mg po bid, with Cogentin 0.5 mg po bid. Plan to give Haldol decanoate 100 mg IM prior to discharge. Patient was offered this and she agrees to it. Continueontinue Depakote to 750 mg po bid. Depakote level be drawn on 04/15/14. Will continue Trazodone 100 mg po qhs for sleep. Zyprexa Zydis prn for severe anxiety and agitation. Patient to participate in therapeutic milieu. CSW will work on disposition.  Medical Decision Making Problem Points:  Established problem, stable/improving (1), Review of last therapy session (1) and Review of psycho-social stressors (1) Data Points:  Order Aims Assessment (2) Review or order medicine tests (1) Review and summation of old records (2) Review of medication regiment & side effects (2) Review of new medications or change in dosage (2)  I certify that inpatient services furnished can reasonably be expected to improve the patient'[name] condition.   Adonis BrookGUSTIN, SHEILA MAY, AGNP-BC 04/14/2014, 2:15 PM I agree with assessment and plan Madie Renorving A. Dub MikesLugo, M.D.

## 2014-04-14 NOTE — Progress Notes (Signed)
D: Pt has labile affect and mood.  Pt has been sitting in the hallway by herself, laughing loudly at times.  Pt reports her day has been "boring" and that her goal today was "to get some lipstick and that did not work out but my friend might come visit tomorrow and bring me some."  Pt denies SI/HI, denies hallucinations.   A: Met with pt 1:1 and offered support and encouragement.  PRN medications were administered for anxiety and agitation, see flowsheet.  Safety maintained.  R: Pt is compliant with medications, although pt has vomited 3 out of the past 4 nights and refuses to take medication for nausea.  Pt verbally contracts for safety.  Will continue to monitor and assess for safety.

## 2014-04-14 NOTE — Progress Notes (Addendum)
D: Pt has labile affect and mood.  Pt reports her goal today was "to get out the hospital."  Pt denies SI/HI, denies hallucinations.  Pt laughs and smiles inappropriately at times.  Pt did not attend evening group.  She repeatedly asks staff to try to get earrings for her.  Pt came out of her room earlier in shift and reported "I threw up."  Pt had vomited next to her bed.  She reported that she did not want medication for nausea.  She requested and was provided with ginger ale and reported that she was less nauseous after drinking it.   A: Medications administered per order.  PRN medication administered for agitation and anxiety, see flowsheet.  Safety maintained.  Supported and encouraged pt.  Pt has very bad body odor.  Writer encouraged pt to perform ADLs and pt stated "I already took I shower."  Pt continues to refuse to bathe. R: Pt is compliant with medications.  She verbally contracts for safety.  Will continue to monitor and assess for safety.

## 2014-04-14 NOTE — Progress Notes (Signed)
Pt slept until around 1100 this morning.  She did not wake up for 0800 medications and with her metal capacity as is decided to let pt sleep until she got up on her own. She had refused to fill out or answer any questions on her self-inventory but finally she did give me some responses.  She denied any depression, hopelessness or anxiety.  She denied any S/H ideation or A/V/H.  As the afternoon progressed she came up and stated,"I need to get an abortion because a demon just went through my legs and now I am pregnant".  She also asked for a pregnancy test.  Informed her she was already given a pregnancy test and that this Clinical research associatewriter understands that she probably really feels that way, but it is her mental illness.   She had pulled out a pill wrapped in tissue from her bra that she claims was from last night.  She broke it in half she through one in the trash and tried to take the other half and asked her to please hand it to me she did and it was thrown away too. She did take her medications today and she did swallow them.  She was given some cookies to eat afterwards to insure she did swallow them.

## 2014-04-14 NOTE — [facility] Group Notes (Signed)
0800 nursing orientation group   The focus of this group is to educate the patient on the purpose and policies of crisis stabilization and provide a format to answer questions about their admission.  The group details unit policies and expectations of patients while admitted.  Pt did not attend she was in her bed asleep.

## 2014-04-15 MED ORDER — HALOPERIDOL DECANOATE 100 MG/ML IM SOLN
100.0000 mg | INTRAMUSCULAR | Status: DC
Start: 2014-04-15 — End: 2014-04-17
  Filled 2014-04-15: qty 1

## 2014-04-15 MED ORDER — BENZTROPINE MESYLATE 1 MG/ML IJ SOLN
0.5000 mg | INTRAMUSCULAR | Status: DC
  Administered 2014-04-15: 0.5 mg via INTRAMUSCULAR
  Filled 2014-04-15: qty 0.5
  Filled 2014-04-15: qty 2

## 2014-04-15 MED ORDER — HALOPERIDOL DECANOATE 100 MG/ML IM SOLN
INTRAMUSCULAR | Status: AC
  Administered 2014-04-15: 100 mg
  Filled 2014-04-15: qty 1

## 2014-04-15 NOTE — Tx Team (Signed)
  Interdisciplinary Treatment Plan Update   Date Reviewed:  04/15/2014  Time Reviewed:  10:38 AM  Progress in Treatment:   Attending groups: Yes Participating in groups: Yes Taking medication as prescribed: Yes  Tolerating medication: Yes Family/Significant other contact made: Yes  Patient understands diagnosis: Yes  Discussing patient identified problems/goals with staff: Yes  See initial care plan Medical problems stabilized or resolved: Yes Denies suicidal/homicidal ideation: Yes  In tx team Patient has not harmed self or others: Yes  For review of initial/current patient goals, please see plan of care.  Estimated Length of Stay:  1-2 days  Reason for Continuation of Hospitalization: Medication stabilization  New Problems/Goals identified:  N/A  Discharge Plan or Barriers:   follow up with ACT team who will work with patient on housing, medication management, getting her bank card to access funds  Additional Comments:  Dr Elna BreslowEappen signed paperwork for SSDI stating that pt needs payee.  Will send that today.  Will also call them to confirm check, and then alert ACT team about process for going forward with housing and funds.  Pt appears to be close to baseline.  Dr will order haldol deconoate shot for tomorrow if patient will cooperate.  Attendees:  Signature: Ivin BootySarama Eappen, MD 04/15/2014 10:38 AM   Signature: Richelle Itood Rayshell Goecke, [name] 04/15/2014 10:38 AM  Signature: Scotty CourtMariion Friedman, RN 04/15/2014 10:38 AM  Signature:  04/15/2014 10:38 AM  Signature:  04/15/2014 10:38 AM  Signature:  04/15/2014 10:38 AM  Signature:   04/15/2014 10:38 AM  Signature:    Signature:    Signature:    Signature:    Signature:    Signature:      Scribe for Treatment Team:   Richelle Itood Keondria Siever, [name]  04/15/2014 10:38 AM

## 2014-04-15 NOTE — Plan of Care (Signed)
Problem: Ineffective individual coping Goal: STG-Increase in ability to manage activities of daily living Outcome: Not Progressing Patient remains disheveled with poor hygiene.   Problem: Alteration in thought process Goal: STG-Patient does not respond to command hallucinations Outcome: Progressing Patient denies AVH

## 2014-04-15 NOTE — Plan of Care (Signed)
Problem: Alteration in thought process Goal: STG-Patient is able to follow short directions Outcome: Progressing Pt is able to follow short directions and has chosen to do so this shift more than she has during previous shifts Clinical research associatewriter has worked.

## 2014-04-15 NOTE — [facility] Group Notes (Signed)
[facility] [name] Group Therapy  04/15/2014 1:45 PM  Type of Therapy:  Group Therapy  Participation Level:  Minimal  Participation Quality:  Attentive  Affect:  Flat  Cognitive:  Alert  Insight:  Limited  Engagement in Therapy:  Limited  Modes of Intervention:  Discussion, Education, Socialization and Support  Summary of Progress/Problems:Today's Topic: Overcoming Obstacles. Pt was challenged to identify obstacles faced currently and processed barriers involved in overcoming these obstacles. Group members were asked to process the steps necessary for overcoming these obstacles and explored motivation (internal and external) for facing these difficulties head on. Pt was asked to identify at least one area of concern in their lives and choose a skill of focus pulled from their "toolbox." "I have always loved the holidays. It's a chance to start over." She attended group and stayed the entire time. She sat quietly and participated when asked. She stated she overcome people robbing her. [name] stated people keep taking her credit cards. It is difficult for her to trust people because people tend to take advantage of her.   She responded positively to feedback from another patient who likes her, and enjoys making her smile.    [name],[name] 04/15/2014, 1:45 PM

## 2014-04-15 NOTE — Progress Notes (Signed)
Patient rested in bed until approximately 1100 today. Cursing and yelling at MD and staff when awoken for AM meds. Continues with gamey behavior as evidenced by refusing meds and then as staff turns to leave states, "no I do want them." She is disheveled with poor hygiene. Demands her needs be met immediately. After lunch patient became agitated and threw full cup on ginger ale on the floor. Redirection frequently given. Medicated per orders. Patient received Haldol Dec along with cogentin IM. Denies SI/HI/AVH. Will continue to monitor closely. Jamie Kato

## 2014-04-15 NOTE — Progress Notes (Signed)
Psychoeducational Group Note  Date:  04/15/2014 Time:  1204  Group Topic/Focus:  Developing a Wellness Toolbox:   The focus of this group is to help patients develop a "wellness toolbox" with skills and strategies to promote recovery upon discharge.  Participation Level: Did Not Attend  Participation Quality:  Not Applicable  Affect:  Not Applicable  Cognitive:  Not Applicable  Insight:  Not Applicable  Engagement in Group: Not Applicable  Additional Comments:  Pt refused to attend group this morning.  Sherrin Stahle E 04/15/2014, 12:05 PM

## 2014-04-15 NOTE — Progress Notes (Signed)
The focus of this group is to help patients review their daily goal of treatment and discuss progress on daily workbooks. Pt attended the evening group session but left multiple times, did not appear engaged and responded minimally to discussion prompts. Pt reported having a good day on the unit, the highlight of which was making a new friend. Pt told the group she was ready to go home and her only additional need from Nursing Staff this evening was "a pass out of here and a cigarette." Pt's affect was flat.

## 2014-04-15 NOTE — [facility] Group Notes (Signed)
[name]   04/15/2014 10:37 AM  Participation Quality:  Sleeping  Did not attend    Kiribatiorth, Baldo DaubRodney B

## 2014-04-15 NOTE — Progress Notes (Signed)
Patient ID: Molly [name], female   DOB: 04-Jul-1985, 28 y.o.   MRN: [id number] PER STATE REGULATIONS 482.30  THIS CHART WAS REVIEWED FOR MEDICAL NECESSITY WITH RESPECT TO THE PATIENT'S ADMISSION/ DURATION OF STAY.  NEXT REVIEW DATE: 04/17/2014  Willa RoughJENNIFER JONES Dhilan Brauer, RN, BSN CASE MANAGER

## 2014-04-15 NOTE — Progress Notes (Signed)
Patient ID: Molly [name], female   DOB: 14-Dec-1985, 28 y.o.   MRN: [id number] Patient ID: Molly [name], female   DOB: [birth year], 28 y.o.   MRN: [id number] [facility] MD Progress Note  04/15/2014 2:36 PM Molly [name]  MRN:  [id number] Subjective: Patient states,'what do you want ,let me sleep.:"  Objective: Molly [name] is seen and chart reviewed. She presents today irritable ,responding to questions minimally. Patient needs a lot of redirection. Patient continues to be disheveled ,needs a lot of encouragement to take a bath or participate in group activities. Patient denies SI/HI/AH/VH.  Patient possibly with borderline intellectual functioning, no documentation available as far as IQ evaluation. CSW will work on Print production planner.  Diagnosis:   DSM5: Primary Psychiatric Diagnosis: Schizoaffective disorder,bipolar type ,multiple episodes ,currently in acute episode  Secondary Psychiatric Diagnosis: Borderline Intellectual functioning  Non Psychiatric Diagnosis: See pmh  Total Time spent with patient: 30 minutes   ADL's:  Intact  Sleep: Fair  Appetite:  Fair   Psychiatric Specialty Exam: Physical Exam  ROS  Blood pressure 104/73, pulse 94, temperature 97.8 F (36.6 C), temperature source Oral, resp. rate 18, height 5\' 8"  (1.727 m), weight 123.378 kg (272 lb), SpO2 98 %.Body mass index is 41.37 kg/(m^2).  General Appearance: Casual  Eye Contact::  Fair  Speech:  Slow  Volume:  Normal  Mood:  Angry and Irritable  Affect:  Labile  Thought Process:  Linear  Orientation:  Other:  person and place  Thought Content:  Delusions,  IMPROVING  Suicidal Thoughts:  No  Homicidal Thoughts:  No  Memory:  Immediate;   Fair Recent;   Fair Remote;   Fair  Judgement:  Fair  Insight:  Shallow  Psychomotor Activity:  Normal  Concentration:  Fair  Recall:  [employer] of Knowledge:Fair  Language: Good  Akathisia:  No  Handed:  Right  AIMS (if indicated):     Assets:  Communication Skills   Sleep:  Number of Hours: 4.75   Musculoskeletal: Strength & Muscle Tone: within normal limits Gait & Station: normal Patient leans: N/A  Current Medications: Current Facility-Administered Medications  Medication Dose Route Frequency Provider Last Rate Last Dose  . acetaminophen (TYLENOL) tablet 650 mg  650 mg Oral Q6H PRN Shuvon Rankin, NP   650 mg at 04/14/14 0865  . alum & mag hydroxide-simeth (MAALOX/MYLANTA) 200-200-20 MG/5ML suspension 30 mL  30 mL Oral Q4H PRN Shuvon Rankin, NP      . benztropine (COGENTIN) tablet 0.5 mg  0.5 mg Oral BID Fransisca Kaufmann, NP   0.5 mg at 04/15/14 1106  . haloperidol decanoate (HALDOL DECANOATE) 100 MG/ML injection 100 mg  100 mg Intramuscular Q30 days Jomarie Longs, MD   0 mg at 04/15/14 1100   And  . benztropine mesylate (COGENTIN) injection 0.5 mg  0.5 mg Intramuscular Q30 days Jomarie Longs, MD   0.5 mg at 04/15/14 1105  . divalproex (DEPAKOTE ER) 24 hr tablet 750 mg  750 mg Oral BID Jomarie Longs, MD   750 mg at 04/15/14 1106  . feeding supplement (RESOURCE BREEZE) (RESOURCE BREEZE) liquid 1 Container  1 Container Oral TID BM Lindwood Qua, NP   1 Container at 04/15/14 1156  . haloperidol (HALDOL) tablet 10 mg  10 mg Oral BID Jomarie Longs, MD   10 mg at 04/15/14 1106  . LORazepam (ATIVAN) tablet 2 mg  2 mg Oral Q6H PRN Kerry Hough, PA-C   2 mg at 04/14/14 1934  .  magnesium hydroxide (MILK OF MAGNESIA) suspension 30 mL  30 mL Oral Daily PRN Shuvon Rankin, NP      . mupirocin ointment (BACTROBAN) 2 %   Topical BID Janecia Palau, MD      . nicotine (NICODERM CQ - dosed in mg/24 hours) patch 21 mg  21 mg Transdermal Q0600 Lindwood Qua, NP   21 mg at 04/15/14 0800  . OLANZapine (ZYPREXA) injection 5 mg  5 mg Intramuscular Q8H PRN Jarrel Knoke, MD      . OLANZapine zydis (ZYPREXA) disintegrating tablet 5 mg  5 mg Oral Q8H PRN Fransisca Kaufmann, NP   5 mg at 04/14/14 1934  . traZODone (DESYREL) tablet 100 mg  100 mg Oral QHS Shuvon Rankin, NP    100 mg at 04/14/14 2100    Lab Results:  No results found for this or any previous visit (from the past 48 hour(s)).  Physical Findings: AIMS: Facial and Oral Movements Muscles of Facial Expression: None, normal Lips and Perioral Area: None, normal Jaw: None, normal Tongue: None, normal,Extremity Movements Upper (arms, wrists, hands, fingers): None, normal Lower (legs, knees, ankles, toes): None, normal, Trunk Movements Neck, shoulders, hips: None, normal, Overall Severity Severity of abnormal movements (highest score from questions above): None, normal Incapacitation due to abnormal movements: None, normal Patient's awareness of abnormal movements (rate only patient's report): No Awareness, Dental Status Current problems with teeth and/or dentures?: No Does patient usually wear dentures?: No  CIWA:    COWS:     Treatment Plan Summary: Daily contact with patient to assess and evaluate symptoms and progress in treatment Medication management  Plan: Will continue Haldol  10 mg po bid, with Cogentin 0.5 mg po bid. Will give Haldol decanoate 100 mg IM along with Cogentin IM today.  Patient was offered this and she agrees to it. Continue Depakote 750 mg po bid. Depakote level be drawn on 04/15/14, patient refused her AM labs - will reorder for tonight. Will continue Trazodone 100 mg po qhs for sleep. Zyprexa Zydis prn for severe anxiety and agitation. Patient to participate in therapeutic milieu. CSW will work on disposition.  Medical Decision Making Problem Points:  Established problem, stable/improving (1), Review of last therapy session (1) and Review of psycho-social stressors (1) Data Points:  Order Aims Assessment (2) Review or order medicine tests (1) Review and summation of old records (2) Review of medication regiment & side effects (2) Review of new medications or change in dosage (2)  I certify that inpatient services furnished can reasonably be expected to improve  the patient's condition.   Aneliese Beaudry Md 04/15/2014, 2:36 PM

## 2014-04-16 MED ORDER — NICOTINE POLACRILEX 2 MG MT GUM
2.0000 mg | CHEWING_GUM | OROMUCOSAL | Status: DC | PRN
  Administered 2014-04-16 (×2): 2 mg via ORAL

## 2014-04-16 NOTE — Progress Notes (Signed)
Patient ID: Molly [name], female   DOB: [birth year], 28 y.o.   MRN: [id number]   [facility]  04/16/2014 4:07 PM Molly FreshJessica S [name]  MRN:  [id number] Subjective: Patient states "I want a cigarette. I am hungry. I am bleeding from my vagina. Sometimes I don't take my medications and feel like I have hallucinations. Then I feel nervous. I called the police because I am bleeding."  Objective:  Patient seen and chart reviewed. The patient per staff report continues to be very intrusive and childlike. Her phone privileges needed to be restricted for 24 hours due to frequent calls to the police. Molly [name] appears to have poor understanding of having a regular menstrual cycle. Staff report patient also had problems caring for her menses on her last admission to this hospital. According to [facility] the patient has been compliant with all medications except her Trazodone last night. Patient complained of bleeding from past abscess. Upon assessment the area appears well healed with no active drainage. The patient was educated about her menses and encouraged to utilize the sanitary napkins that are available on the unit. She appears to have difficulty processing information during conversation. Patient upset about not being able to go to cafeteria. When asked the reason replied "Because I leave when I want to go." Patient possibly with borderline intellectual functioning, no documentation available as far as IQ evaluation. CSW will work on Print production plannerpayee/placement. The patient does not appear to be actively psychotic at this time. Denies any intent to harm herself or others.   Diagnosis:   DSM5: Primary Psychiatric Diagnosis: Schizoaffective disorder,bipolar type ,multiple episodes ,currently in acute episode  Secondary Psychiatric Diagnosis: Borderline Intellectual functioning  Non Psychiatric Diagnosis: See pmh  Total Time spent with patient: 20 minutes  ADL's:  Intact-needs prompting to complete per  staff  Sleep: Good  Appetite:  Fair  Psychiatric Specialty Exam: Physical Exam  Review of Systems  Constitutional: Negative.   HENT: Negative.   Eyes: Negative.   Respiratory: Negative.   Cardiovascular: Negative.   Gastrointestinal: Negative.   Genitourinary: Negative.   Musculoskeletal: Negative.   Skin: Negative.   Neurological: Negative.   Endo/Heme/Allergies: Negative.   Psychiatric/Behavioral: Positive for hallucinations. The patient is nervous/anxious.     Blood pressure 104/73, pulse 94, temperature 97.8 F (36.6 C), temperature source Oral, resp. rate 18, height 5\' 8"  (1.727 m), weight 123.378 kg (272 lb), SpO2 98 %.Body mass index is 41.37 kg/(m^2).  General Appearance: Casual  Eye Contact::  Fair  Speech:  Slow  Volume:  Normal  Mood:  Irritable  Affect:  Labile  Thought Process:  Linear  Orientation:  Full (Time, Place, and Person)  Thought Content:  Delusions,  IMPROVING  Suicidal Thoughts:  No  Homicidal Thoughts:  No  Memory:  Immediate;   Fair Recent;   Fair Remote;   Fair  Judgement:  Fair  Insight:  Shallow  Psychomotor Activity:  Normal  Concentration:  Fair  Recall:  [employer]  Fund of Knowledge:Fair  Language: Good  Akathisia:  No  Handed:  Right  AIMS (if indicated):     Assets:  Communication Skills Physical Health Resilience  Sleep:  Number of Hours: 6.75   Musculoskeletal: Strength & Muscle Tone: within normal limits Gait & Station: normal Patient leans: N/A  Current Medications: Current Facility-Administered Medications  Medication Dose Route Frequency Provider Last Rate Last Dose  . acetaminophen (TYLENOL) tablet 650 mg  650 mg Oral Q6H PRN Assunta FoundShuvon Rankin, NP  650 mg at 04/14/14 [id number]  . alum & mag hydroxide-simeth (MAALOX/MYLANTA) 200-200-20 MG/5ML suspension 30 mL  30 mL Oral Q4H PRN Shuvon Rankin, NP      . benztropine (COGENTIN) tablet 0.5 mg  0.5 mg Oral BID Fransisca KaufmannLaura Davis, NP   0.5 mg at 04/16/14 1603  . haloperidol decanoate  (HALDOL DECANOATE) 100 MG/ML injection 100 mg  100 mg Intramuscular Q30 days Jomarie LongsSaramma Eappen, MD   0 mg at 04/15/14 1100   And  . benztropine mesylate (COGENTIN) injection 0.5 mg  0.5 mg Intramuscular Q30 days Jomarie LongsSaramma Eappen, MD   0.5 mg at 04/15/14 1105  . divalproex (DEPAKOTE ER) 24 hr tablet 750 mg  750 mg Oral BID Jomarie LongsSaramma Eappen, MD   750 mg at 04/16/14 1603  . feeding supplement (RESOURCE BREEZE) (RESOURCE BREEZE) liquid 1 Container  1 Container Oral TID BM Lindwood QuaSheila May Agustin, NP   1 Container at 04/16/14 1200  . haloperidol (HALDOL) tablet 10 mg  10 mg Oral BID Jomarie LongsSaramma Eappen, MD   10 mg at 04/16/14 1603  . LORazepam (ATIVAN) tablet 2 mg  2 mg Oral Q6H PRN Kerry HoughSpencer E Simon, PA-C   2 mg at 04/16/14 1604  . magnesium hydroxide (MILK OF MAGNESIA) suspension 30 mL  30 mL Oral Daily PRN Shuvon Rankin, NP      . mupirocin ointment (BACTROBAN) 2 %   Topical BID Saramma Eappen, MD      . nicotine (NICODERM CQ - dosed in mg/24 hours) patch 21 mg  21 mg Transdermal Q0600 Lindwood QuaSheila May Agustin, NP   21 mg at 04/15/14 0800  . OLANZapine (ZYPREXA) injection 5 mg  5 mg Intramuscular Q8H PRN Saramma Eappen, MD      . OLANZapine zydis (ZYPREXA) disintegrating tablet 5 mg  5 mg Oral Q8H PRN Fransisca KaufmannLaura Davis, NP   5 mg at 04/14/14 1934  . traZODone (DESYREL) tablet 100 mg  100 mg Oral QHS Shuvon Rankin, NP   100 mg at 04/14/14 2100    Lab Results:  No results found for this or any previous visit (from the past 48 hour(s)).  Physical Findings: AIMS: Facial and Oral Movements Muscles of Facial Expression: None, normal Lips and Perioral Area: None, normal Jaw: None, normal Tongue: None, normal,Extremity Movements Upper (arms, wrists, hands, fingers): None, normal Lower (legs, knees, ankles, toes): None, normal, Trunk Movements Neck, shoulders, hips: None, normal, Overall Severity Severity of abnormal movements (highest score from questions above): None, normal Incapacitation due to abnormal movements: None,  normal Patient's awareness of abnormal movements (rate only patient's report): No Awareness, Dental Status Current problems with teeth and/or dentures?: No Does patient usually wear dentures?: No  CIWA:    COWS:     Treatment Plan Summary: Daily contact with patient to assess and evaluate symptoms and progress in treatment Medication management  Plan: Will continue Haldol 10 mg po bid, with Cogentin 0.5 mg po bid. Received Haldol decanoate 100 mg IM along with Cogentin IM yesterday to improve compliane  Continue Depakote 750 mg po bid. Depakote level be drawn on 04/16/14 prior to pm dose Will continue Trazodone 100 mg po qhs for sleep. Zyprexa Zydis prn for severe anxiety and agitation. Patient to participate in therapeutic milieu. CSW will work on disposition.  Medical Decision Making Problem Points:  Established problem, stable/improving (1), Review of last therapy session (1) and Review of psycho-social stressors (1) Data Points:  Order Aims Assessment (2) Review or order medicine tests (1) Review of medication regiment &  side effects (2) Review of new medications or change in dosage (2)  I certify that inpatient services furnished can reasonably be expected to improve the patient's condition.   DAVIS, LAURA NP-C 04/16/2014, 4:07 PM I agree with assessment and plan Madie Reno A. Dub Mikes, M.D.

## 2014-04-16 NOTE — Progress Notes (Signed)
Patient ID: Molly FreshJessica S [name], female   DOB: Sep 30, 1985, 28 y.o.   MRN: [id number] PER STATE REGULATIONS 482.30  THIS CHART WAS REVIEWED FOR MEDICAL NECESSITY WITH RESPECT TO THE PATIENT'S ADMISSION/ DURATION OF STAY.  NEXT REVIEW DATE: 04/21/2014  Willa RoughJENNIFER JONES Molly Micheletti, RN, BSN CASE MANAGER

## 2014-04-16 NOTE — Progress Notes (Signed)
Patient's status remains unchanged. She continues to have periods of sleep, followed by periods where she is visible in the milieu. Continues to make demands and has short to no patience when demands are not immediately met. Patient received mail which was opened in front of her per unit policy. Because it was a debit/visa card, explained to patient it would be secured in her locker by staff and security. Patient hostile, cursing, demanding to see police as this is a "federal offense". Demanding to see [name] who was called and message was left. This is patient's usual pattern of behavior - sleeping, awakening, acting out and returning to sleep. This also appears to be patient's baseline as she is known to this Probation officer from previous care. She was medicated and redirected with little success. Will continue to monitor closely as patient's behavior is unpredictable and disruptive. No SI/HI/AVH and she remains safe. Jamie Kato

## 2014-04-16 NOTE — Clinical Social Work Note (Signed)
Patient has been assigned care coordinator w [facility] - Kyra LeylandJennifer Gates [phone]).    Santa GeneraAnne Cunningham, [name] Clinical Social Worker

## 2014-04-16 NOTE — [facility] Group Notes (Signed)
[facility] Group Notes:  (Nursing/MHT/Case Management/Adjunct)  Date:  04/16/2014  Time:  9:00am  Type of Therapy:  Nurse Education  Participation Level:  Did Not Attend  Participation Quality:    Affect:    Cognitive:    Insight:    Engagement in Group:    Modes of Intervention:    Summary of Progress/Problems: Patient was invited to group however remained in bed asleep.  Lawrence MarseillesFriedman, Rinaldo Macqueen Eakes 04/16/2014, 5:53 PM

## 2014-04-16 NOTE — Progress Notes (Signed)
D: Pt denies SI/HI/AVH. Pt is labile, moody, irritable, and childlike. Pt shows self control when she wants to and is tolerant to  various people, which shows pt knows what she is doing. Pt stated she felt better, "i met someone new"  A: Pt was offered support and encouragement. Pt was given scheduled medications. Pt was encourage to attend groups. Q 15 minute checks were done for safety.   R:Pt attends groups and interacts well with peers and staff. Pt is taking medication. Pt receptive to treatment and safety maintained on unit.

## 2014-04-16 NOTE — Progress Notes (Signed)
D: Pt denies SI/HI/AVH. Pt remained in bed most of the night. Pt got up was a little irritable, but cooperated.  A: Pt was offered support and encouragement.  Pt was encourage to attend groups. Q 15 minute checks were done for safety.   R: Pt is taking medication. Pt has no complaints.Pt receptive to treatment and safety maintained on unit.

## 2014-04-16 NOTE — [facility] Group Notes (Signed)
[facility] Group Notes:  (Counselor/Nursing/MHT/Case Management/Adjunct)  04/16/2014 1:15PM  Type of Therapy:  Group Therapy  Participation Level:  Active  Participation Quality:  Appropriate  Affect:  Flat  Cognitive:  Oriented  Insight:  Improving  Engagement in Group:  Limited  Engagement in Therapy:  Limited  Modes of Intervention:  Discussion, Exploration and Socialization  Summary of Progress/Problems: The topic for group was balance in life.  Pt participated in the discussion about when their life was in balance and out of balance and how this feels.  Pt discussed ways to get back in balance and short term goals they can work on to get where they want to be. Stayed for the entire group, but minimal participation.  Stated she feels balanced because "I'm listening to music in my head."  Then went on to talk about videos and how others would be upset with her when she makes some in a kind of not-based-in-reality way.  After group was very goal directed in that she told me she had reported her money card missing, and gave them our address as her mailing address.  Also asked for help in calling some places to see if she could stay there after d/c.   Daryel Geraldorth, Zackarie Chason B 04/16/2014 10:39 AM

## 2014-04-16 NOTE — Plan of Care (Signed)
Problem: Ineffective individual coping Goal: STG: Pt will be able to identify effective and ineffective STG: Pt will be able to identify effective and ineffective coping patterns  Outcome: Not Progressing Patient sleeps much of the day, attends few groups. When she attends, she stares off out the window.  Problem: Alteration in thought process Goal: STG-Patient is able to discuss thoughts with staff Outcome: Not Progressing Patient continues to curse staff, make demands and is argumentative.

## 2014-04-16 NOTE — Progress Notes (Signed)
Patient continuing to glare at Emerson Electricwriter, cursing. Demanding action be taken regarding her debit card to each and every person who walked on to the unit. [employer] arrived as did an ambulance stating patient had called them with various reports of distress. [employer] came on to the unit reassuring patient that as 911 operator had indicated, valuables are to be secured when patients are in a locked facility. Patient verbalized understanding however continued to state she is "dying because I'm bleeding." Offered patient some prn medications as well as supplies for her menses however pt angrily declined. Informed NP/MD that patient wanted to be assessed. Patient continues to disrupt milieu. Molly [name], Molly [name]

## 2014-04-16 NOTE — [facility] Suicide Risk Assessment (Signed)
[facility] INPATIENT:  Family/Significant Other Suicide Prevention Education  Suicide Prevention Education:  Education Completed; No one has been identified by the patient as the family member/significant other with whom the patient will be residing, and identified as the person(s) who will aid the patient in the event of a mental health crisis (suicidal ideations/suicide attempt).  With written consent from the patient, the family member/significant other has been provided the following suicide prevention education, prior to the and/or following the discharge of the patient.  The suicide prevention education provided includes the following:  Suicide risk factors  Suicide prevention and interventions  National Suicide Hotline telephone number  [facility] assessment telephone number  [facility] 911  [facility] and/or [facility]idential Mobile Crisis [facility] telephone number  Request made of family/significant other to:  Remove weapons (e.g., guns, rifles, knives), all items previously/currently identified as safety concern.    Remove drugs/medications (over-the-counter, prescriptions, illicit drugs), all items previously/currently identified as a safety concern.  The family member/significant other verbalizes understanding of the suicide prevention education information provided.  The family member/significant other agrees to remove the items of safety concern listed above. The patient did not endorse SI at the time of admission, nor did the patient c/o SI during the stay here.  SPE not required.   Daryel Geraldorth, Dominic Mahaney B 04/16/2014, 4:26 PM

## 2014-04-17 DIAGNOSIS — R4183 Borderline intellectual functioning: Secondary | ICD-10-CM | POA: Insufficient documentation

## 2014-04-17 MED ORDER — DIVALPROEX SODIUM ER 250 MG PO TB24: 750.0000 mg | ORAL_TABLET | Freq: Two times a day (BID) | ORAL | Status: DC

## 2014-04-17 MED ORDER — HALOPERIDOL DECANOATE 100 MG/ML IM SOLN: 100.0000 mg | INTRAMUSCULAR | Status: DC

## 2014-04-17 MED ORDER — BENZTROPINE MESYLATE 0.5 MG PO TABS: 0.5000 mg | ORAL_TABLET | Freq: Two times a day (BID) | ORAL | Status: DC

## 2014-04-17 MED ORDER — BENZTROPINE MESYLATE 1 MG/ML IJ SOLN: 0.5000 mg | INTRAMUSCULAR | Status: DC

## 2014-04-17 MED ORDER — TRAZODONE HCL 100 MG PO TABS: 100.0000 mg | ORAL_TABLET | Freq: Every day | ORAL | Status: DC

## 2014-04-17 MED ORDER — HALOPERIDOL 10 MG PO TABS: 10.0000 mg | ORAL_TABLET | Freq: Two times a day (BID) | ORAL | Status: DC

## 2014-04-17 NOTE — [facility] Suicide Risk Assessment (Signed)
Demographic Factors:  NA  Total Time spent with patient: 45 minutes  Psychiatric Specialty Exam: Physical Exam  ROS  Blood pressure 104/73, pulse 84, temperature 98.4 F (36.9 C), temperature source Oral, resp. rate 20, height 5\' 8"  (1.727 m), weight 123.378 kg (272 lb), SpO2 98 %.Body mass index is 41.37 kg/(m^2).  General Appearance: Fairly Groomed  Patent attorneyye Contact::  Fair  Speech:  Clear and Coherent  Volume:  Normal  Mood:  Euthymic  Affect:  Congruent  Thought Process:  Coherent  Orientation:  Full (Time, Place, and Person)  Thought Content:  WDL  Suicidal Thoughts:  No  Homicidal Thoughts:  No  Memory:  Immediate;   Fair Recent;   Fair Remote;   Fair  Judgement:  Fair  Insight:  Fair  Psychomotor Activity:  Normal  Concentration:  Fair  Recall:  FiservFair  Fund of Knowledge:Fair  Language: Fair  Akathisia:  No  Handed:  Right  AIMS (if indicated):     Assets:  Communication Skills Desire for Improvement  Sleep:  Number of Hours: 5.5    Musculoskeletal: Strength & Muscle Tone: within normal limits Gait & Station: normal Patient leans: N/A   Mental Status Per Nursing Assessment::   On Admission:  NA  Current Mental Status by Physician: Patient denies SI/HI/AH/VH  Loss Factors: NA  Historical Factors: Impulsivity  Risk Reduction Factors:   Living with another person, especially a relative  Continued Clinical Symptoms:  Unstable or Poor Therapeutic Relationship Previous Psychiatric Diagnoses and Treatments  Cognitive Features That Contribute To Risk:  Closed-mindedness    Suicide Risk:  Minimal: No identifiable suicidal ideation.  Patients presenting with no risk factors but with morbid ruminations; may be classified as minimal risk based on the severity of the depressive symptoms  Discharge Diagnoses:  DSM5: Primary Psychiatric Diagnosis: Schizoaffective disorder,bipolar type ,multiple episodes ,currently in acute episode (resolving)  Secondary  Psychiatric Diagnosis: Borderline Intellectual functioning  Non Psychiatric Diagnosis: See pmh    Past Medical History  Diagnosis Date  . Schizophrenia   . CERVICITIS, GONOCOCCAL, History of 01/09/2007    Qualifier: History of  By: McDiarmid MD, Tawanna Coolerodd    . ATTENTION DEFICIT, W/O HYPERACTIVITY, History of 06/30/2006    Qualifier: History of  By: McDiarmid MD, Tawanna Coolerodd    . Depression   . 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 Dennie Bibleichard Larsen (Psych) At [facility] in  [place], [place]. Qualifier: Hospitalized for  By: McDiarmid MD, Tawanna Coolerodd    . 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 TOBACCO USER 02/08/2009    Qualifier: Diagnosis of  By: Knox Royaltydell, Erin    . 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 ASC-cannot exclude HGSIL on Pap 02/15/2012    ASC-US on 02/03/2012 pap (associated Trichomonas infection). No reflex HPV testing performed on specimen.  Patient informed that she will need repeat Pap in one year.      . Diabetes mellitus     diet controlled  . Eczema   . Abnormal Pap smear     Plan Of Care/Follow-up recommendations:  Activity:  NO RESTRICTIONS Diet:  REGULAR  Is patient on multiple antipsychotic therapies at discharge:  No   Has Patient had three or more failed trials of antipsychotic monotherapy by history:  No  Recommended Plan for Multiple Antipsychotic  Therapies: NA    Shela Esses md 04/17/2014, 9:39 AM

## 2014-04-17 NOTE — Progress Notes (Signed)
Patient discharged per physician order; patient denies SI/HI and A/V hallucinations; patient received samples, prescriptions, after it was reviewed; patient had no other questions or concerns at this time; patient verbalized and signed that she received all belongings; patient left the unit ambulatory with Mr. Felicity PellegriniMurdock of the boarding house.

## 2014-04-17 NOTE — Discharge Summary (Signed)
Physician Discharge Summary Note  Patient:  Molly [name] is an 28 y.o., female MRN:  [id number] DOB:  [birth year] Patient phone:  [phone] (home)  Patient address:   [place] [address],  Total Time spent with patient: 20 minutes  Date of Admission:  04/09/2014 Date of Discharge: 04/17/14  Reason for Admission:  Acute psychosis   Discharge Diagnoses: Principal Problem:   Schizoaffective disorder, bipolar type Active Problems:   Borderline intellectual functioning  Psychiatric Specialty Exam: Physical Exam  Psychiatric: She has a normal mood and affect. Her speech is normal and behavior is normal. Judgment and thought content normal. Cognition and memory are normal.    Review of Systems  Constitutional: Negative.   HENT: Negative.   Eyes: Negative.   Respiratory: Negative.   Cardiovascular: Negative.   Gastrointestinal: Negative.   Genitourinary: Negative.   Musculoskeletal: Negative.   Skin: Negative.   Neurological: Negative.   Endo/Heme/Allergies: Negative.   Psychiatric/Behavioral: Positive for hallucinations and substance abuse (Stable with treatment ).    Blood pressure 104/73, pulse 84, temperature 98.4 F (36.9 C), temperature source Oral, resp. rate 20, height 5\' 8"  (1.727 m), weight 123.378 kg (272 lb), SpO2 98 %.Body mass index is 41.37 kg/(m^2).  See Physician SRA      Past Psychiatric History: See H&P Diagnosis:  Hospitalizations:  Outpatient Care:  Substance Abuse Care:  Self-Mutilation:  Suicidal Attempts:  Violent Behaviors:   Musculoskeletal: Strength & Muscle Tone: within normal limits Gait & Station: normal Patient leans: N/A  DSM5:  Primary Psychiatric Diagnosis: Schizoaffective disorder,bipolar type ,multiple episodes ,currently in acute episode (resolving)  Secondary Psychiatric Diagnosis: Borderline Intellectual functioning  Non Psychiatric Diagnosis: See pmh   Past Medical History  Diagnosis Date  .  Schizophrenia   . CERVICITIS, GONOCOCCAL, History of 01/09/2007    Qualifier: History of By: McDiarmid MD, Tawanna Cooler   . ATTENTION DEFICIT, W/O HYPERACTIVITY, History of 06/30/2006    Qualifier: History of By: McDiarmid MD, Tawanna Cooler   . Depression   . SCHIZOPHRENIA, CATATONIC, HISTORY OF 12/13/2006    Annotation: Diagnoses by Dr. Dennie Bible (Psych) At [facility] in [place], [place]. Qualifier: Hospitalized for By: McDiarmid MD, Tawanna Cooler   . 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 TOBACCO USER 02/08/2009    Qualifier: Diagnosis of By: Knox Royalty   . 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 ASC-cannot exclude HGSIL on Pap 02/15/2012    ASC-US on 02/03/2012 pap (associated Trichomonas infection). No reflex HPV testing performed on specimen. Patient informed that she will need repeat Pap in one year.   . Diabetes mellitus     diet controlled  . Eczema   . Abnormal Pap smear    Level of Care:  OP  Hospital Course:            Molly [name] is a 28 year old female who presented to [facility] reporting that she was about to give birth. The nurse documented that the patient urinated on herself then reported that her water had broken. The patient at that time appeared to be extremely disheveled. After reporting to the staff that she has been homeless the patient stopped communicating. Patient was very guarded during her psychiatric assessment today but was somewhat cooperative stating "I am fine. I don't know why I was brought here. The police just brought me. They say I  have schizophrenia but I don't really know about that. I did get angry about people stealing my Disability money. No I don't think I am pregnant." Molly [name] was noted to avoid making any eye contact during the assessment by reading a magazine. She became  mildly agitated when the subject of pregnancy was mentioned. The patient stopped talking to give writer a very intense stare. She denies being psychotic but is also not able to provide accurate assessment information. Nursing staff report that the patient has been calling the police requesting to be taken to jail and becoming loud on the unit after taking her medications. The patient was at [facility] in early October of 2015 for treatment of acute psychosis. She reports taking her medication but then reports no longer going to [facility] for follow up. It appears that the patient has not been taking her psychiatric medications as prescribed.          Molly [name] was admitted to the adult unit where she was evaluated and her symptoms were identified. Medication management was discussed and implemented. She was encouraged to participate in unit programming. Medical problems were identified and treated appropriately. Home medication was restarted as needed.  She was evaluated each day by a clinical provider to ascertain the patient's response to treatment.  Improvement was noted by the patient's report of decreasing symptoms, improved sleep and appetite, affect, medication tolerance, behavior, and participation in unit programming.  The patient was asked each day to complete a self inventory noting mood, mental status, pain, new symptoms, anxiety and concerns.         She responded well to medication and being in a therapeutic and supportive environment. Positive and appropriate behavior was noted and the patient was motivated for recovery.  She worked closely with the treatment team and case manager to develop a discharge plan with appropriate goals. Coping skills, problem solving as well as relaxation therapies were also part of the unit programming.         By the day of discharge she was in much improved condition than upon admission.  Symptoms were reported as significantly decreased or resolved completely.  The  patient denied SI/HI and voiced no AVH. She was motivated to continue taking medication with a goal of continued improvement in mental health. Molly [name] was discharged home with a plan to follow up as noted below.  Consults:  psychiatry  Significant Diagnostic Studies:  CBC, Chemistry panel, Lipid profile, Depakote level, Prolactin level, Hemoglobin A1C  Discharge Vitals:   Blood pressure 104/73, pulse 84, temperature 98.4 F (36.9 C), temperature source Oral, resp. rate 20, height 5\' 8"  (1.727 m), weight 123.378 kg (272 lb), SpO2 98 %. Body mass index is 41.37 kg/(m^2). Lab Results:   No results found for this or any previous visit (from the past 72 hour(s)).  Physical Findings: AIMS: Facial and Oral Movements Muscles of Facial Expression: None, normal Lips and Perioral Area: None, normal Jaw: None, normal Tongue: None, normal,Extremity Movements Upper (arms, wrists, hands, fingers): None, normal Lower (legs, knees, ankles, toes): None, normal, Trunk Movements Neck, shoulders, hips: None, normal, Overall Severity Severity of abnormal movements (highest score from questions above): None, normal Incapacitation due to abnormal movements: None, normal Patient's awareness of abnormal movements (rate only patient's report): No Awareness, Dental Status Current problems with teeth and/or dentures?: No Does patient usually wear dentures?: No  CIWA:    COWS:     Psychiatric Specialty Exam: See Psychiatric Specialty Exam and  Suicide Risk Assessment completed by Attending Physician prior to discharge.  Discharge destination:  Home  Is patient on multiple antipsychotic therapies at discharge:  No   Has Patient had three or more failed trials of antipsychotic monotherapy by history:  No  Recommended Plan for Multiple Antipsychotic Therapies: NA     Medication List    STOP taking these medications        hydrOXYzine 50 MG tablet  Commonly known as:  ATARAX/VISTARIL      LORazepam 1 MG tablet  Commonly known as:  ATIVAN      TAKE these medications      Indication   benztropine 0.5 MG tablet  Commonly known as:  COGENTIN  Take 1 tablet (0.5 mg total) by mouth 2 (two) times daily.   Indication:  Extrapyramidal Reaction caused by Medications     benztropine mesylate 1 MG/ML injection  Commonly known as:  COGENTIN  Inject 0.5 mLs (0.5 mg total) into the muscle every 30 (thirty) days. First dose received on 04/16/15 with next dose due in thirty days.  Start taking on:  05/16/2014   Indication:  Extrapyramidal Reaction caused by Medications     divalproex 250 MG 24 hr tablet  Commonly known as:  DEPAKOTE ER  Take 3 tablets (750 mg total) by mouth 2 (two) times daily.   Indication:  Mood control     haloperidol 10 MG tablet  Commonly known as:  HALDOL  Take 1 tablet (10 mg total) by mouth 2 (two) times daily.   Indication:  Schizoaffective Disorder     haloperidol decanoate 100 MG/ML injection  Commonly known as:  HALDOL DECANOATE  Inject 1 mL (100 mg total) into the muscle every 30 (thirty) days.  Start taking on:  05/16/2014   Indication:  Schizoaffective Disorder     traZODone 100 MG tablet  Commonly known as:  DESYREL  Take 1 tablet (100 mg total) by mouth at bedtime.   Indication:  Trouble Sleeping       Follow-up Information    Follow up with [facility].   Why:  Will see you at your place at 1:30 the day of d/c   Contact information:   [address]  [place] [phone]     Follow-up recommendations:   Activity: As tolerated  Diet: Diabetic Diet  Tests: NA  Other: See below   Comments:   Take all your medications as prescribed by your mental healthcare provider.  Report any adverse effects and or reactions from your medicines to your outpatient provider promptly.  Patient is instructed and cautioned to not engage in alcohol and or illegal drug use while on prescription medicines.  In the event of  worsening symptoms, patient is instructed to call the crisis hotline, 911 and or go to the nearest ED for appropriate evaluation and treatment of symptoms.  Follow-up with your primary care provider for your other medical issues, concerns and or health care needs.   Total Discharge Time:  Greater than 30 minutes.  SignedFransisca Kaufmann NP-C 04/17/2014, 9:48 AM

## 2014-04-17 NOTE — Progress Notes (Signed)
[facility] :  Will you be returning to the same living situation after discharge: No. At discharge, do you have transportation home?:Yes,  Mr Nathaniel ManMurdoch, boarding house proprieter Do you have the ability to pay for your medications:Yes,  MCD  Release of information consent forms completed and in the chart;  Patient's signature needed at discharge.  Patient to Follow up at: Follow-up Information    Follow up with [facility].   Why:  Will see you at your place at 1:30 the day of d/c   Contact information:   [address]  [place] [[phone]] [phone]      Patient denies SI/HI:   Yes,  yes    Safety Planning and Suicide Prevention discussed:  Yes,  yes    Ida Rogueorth, Eliel Dudding B 04/17/2014, 10:37 AM

## 2014-04-19 NOTE — Progress Notes (Signed)
Patient Discharge Instructions:  After Visit Summary (AVS):   Faxed to:  04/19/14 Psychiatric Admission Assessment Note:   Faxed to:  04/19/14 Suicide Risk Assessment - Discharge Assessment:   Faxed to:  04/19/14 Faxed/Sent to the Next Level Care provider:  04/19/14 Faxed to [facility] @ [phone]  Jerelene ReddenSheena E Jauca, 04/19/2014, 3:53 PM

## 2014-04-30 DIAGNOSIS — F209 Schizophrenia, unspecified: Secondary | ICD-10-CM | POA: Diagnosis not present

## 2014-05-02 ENCOUNTER — Emergency Department (HOSPITAL_COMMUNITY)
Admission: EM | Admit: 2014-05-02 | Discharge: 2014-05-07 | Payer: Medicare Other | Attending: Emergency Medicine | Admitting: Emergency Medicine

## 2014-05-02 ENCOUNTER — Encounter (HOSPITAL_COMMUNITY): Payer: Self-pay | Admitting: *Deleted

## 2014-05-02 DIAGNOSIS — F200 Paranoid schizophrenia: Secondary | ICD-10-CM | POA: Diagnosis not present

## 2014-05-02 DIAGNOSIS — F329 Major depressive disorder, single episode, unspecified: Secondary | ICD-10-CM | POA: Insufficient documentation

## 2014-05-02 DIAGNOSIS — R45850 Homicidal ideations: Secondary | ICD-10-CM | POA: Diagnosis not present

## 2014-05-02 DIAGNOSIS — Z79899 Other long term (current) drug therapy: Secondary | ICD-10-CM | POA: Diagnosis not present

## 2014-05-02 DIAGNOSIS — Z872 Personal history of diseases of the skin and subcutaneous tissue: Secondary | ICD-10-CM | POA: Diagnosis not present

## 2014-05-02 DIAGNOSIS — F29 Unspecified psychosis not due to a substance or known physiological condition: Secondary | ICD-10-CM | POA: Diagnosis not present

## 2014-05-02 DIAGNOSIS — Z720 Tobacco use: Secondary | ICD-10-CM | POA: Diagnosis not present

## 2014-05-02 DIAGNOSIS — E119 Type 2 diabetes mellitus without complications: Secondary | ICD-10-CM | POA: Diagnosis not present

## 2014-05-02 DIAGNOSIS — Z3202 Encounter for pregnancy test, result negative: Secondary | ICD-10-CM | POA: Insufficient documentation

## 2014-05-02 DIAGNOSIS — F419 Anxiety disorder, unspecified: Secondary | ICD-10-CM | POA: Diagnosis not present

## 2014-05-02 DIAGNOSIS — Z8619 Personal history of other infectious and parasitic diseases: Secondary | ICD-10-CM | POA: Insufficient documentation

## 2014-05-02 DIAGNOSIS — Z046 Encounter for general psychiatric examination, requested by authority: Secondary | ICD-10-CM | POA: Diagnosis present

## 2014-05-02 DIAGNOSIS — R4689 Other symptoms and signs involving appearance and behavior: Secondary | ICD-10-CM | POA: Diagnosis present

## 2014-05-02 DIAGNOSIS — F259 Schizoaffective disorder, unspecified: Secondary | ICD-10-CM | POA: Diagnosis present

## 2014-05-02 LAB — COMPREHENSIVE METABOLIC PANEL
ALT: 13 U/L (ref 0–35)
AST: 18 U/L (ref 0–37)
Albumin: 4.2 g/dL (ref 3.5–5.2)
Alkaline Phosphatase: 81 U/L (ref 39–117)
Anion gap: 7 (ref 5–15)
BILIRUBIN TOTAL: 0.4 mg/dL (ref 0.3–1.2)
BUN: 7 mg/dL (ref 6–23)
CHLORIDE: 102 meq/L (ref 96–112)
CO2: 28 mmol/L (ref 19–32)
CREATININE: 0.75 mg/dL (ref 0.50–1.10)
Calcium: 9.2 mg/dL (ref 8.4–10.5)
GFR calc Af Amer: >90 mL/min (ref >90)
GLUCOSE: 99 mg/dL (ref 70–99)
Potassium: 3.7 mmol/L (ref 3.5–5.1)
Sodium: 137 mmol/L (ref 135–145)
Total Protein: 8.2 g/dL (ref 6.0–8.3)

## 2014-05-02 LAB — RAPID URINE DRUG SCREEN, HOSP PERFORMED
AMPHETAMINES: NOT DETECTED
Barbiturates: NOT DETECTED
Benzodiazepines: NOT DETECTED
Cocaine: NOT DETECTED
Opiates: NOT DETECTED
Tetrahydrocannabinol: NOT DETECTED

## 2014-05-02 LAB — CBC
HEMATOCRIT: 37.3 % (ref 36.0–46.0)
Hemoglobin: 12.4 g/dL (ref 12.0–15.0)
MCH: 28.6 pg (ref 26.0–34.0)
MCHC: 33.2 g/dL (ref 30.0–36.0)
MCV: 85.9 fL (ref 78.0–100.0)
Platelets: 352 10*3/uL (ref 150–400)
RBC: 4.34 MIL/uL (ref 3.87–5.11)
RDW: 14.8 % (ref 11.5–15.5)
WBC: 12.7 10*3/uL — ABNORMAL HIGH (ref 4.0–10.5)

## 2014-05-02 LAB — ACETAMINOPHEN LEVEL: Acetaminophen (Tylenol), Serum: <10 ug/mL — ABNORMAL LOW (ref 10–30)

## 2014-05-02 LAB — ETHANOL: Alcohol, Ethyl (B): <5 mg/dL (ref 0–9)

## 2014-05-02 LAB — SALICYLATE LEVEL

## 2014-05-02 MED ORDER — IBUPROFEN 200 MG PO TABS: 600.0000 mg | ORAL_TABLET | Freq: Three times a day (TID) | ORAL | Status: DC | PRN

## 2014-05-02 MED ORDER — ZOLPIDEM TARTRATE 5 MG PO TABS: 5.0000 mg | ORAL_TABLET | Freq: Every evening | ORAL | Status: DC | PRN

## 2014-05-02 MED ORDER — DIVALPROEX SODIUM ER 500 MG PO TB24
750.0000 mg | ORAL_TABLET | Freq: Two times a day (BID) | ORAL | Status: DC
  Administered 2014-05-02 – 2014-05-04 (×4): 750 mg via ORAL
  Filled 2014-05-02 (×6): qty 1

## 2014-05-02 MED ORDER — LORAZEPAM 1 MG PO TABS
1.0000 mg | ORAL_TABLET | Freq: Three times a day (TID) | ORAL | Status: DC | PRN
  Administered 2014-05-04: 1 mg via ORAL
  Filled 2014-05-02: qty 1

## 2014-05-02 MED ORDER — ACETAMINOPHEN 325 MG PO TABS: 650.0000 mg | ORAL_TABLET | ORAL | Status: DC | PRN

## 2014-05-02 MED ORDER — BENZTROPINE MESYLATE 1 MG PO TABS
0.5000 mg | ORAL_TABLET | Freq: Two times a day (BID) | ORAL | Status: DC
  Administered 2014-05-02 – 2014-05-07 (×9): 0.5 mg via ORAL
  Filled 2014-05-02 (×10): qty 1

## 2014-05-02 MED ORDER — TRAZODONE HCL 100 MG PO TABS
100.0000 mg | ORAL_TABLET | Freq: Every day | ORAL | Status: DC
  Administered 2014-05-02 – 2014-05-06 (×4): 100 mg via ORAL
  Filled 2014-05-02 (×5): qty 1

## 2014-05-02 MED ORDER — HALOPERIDOL 5 MG PO TABS
10.0000 mg | ORAL_TABLET | Freq: Two times a day (BID) | ORAL | Status: DC
  Administered 2014-05-02 – 2014-05-03 (×2): 10 mg via ORAL
  Filled 2014-05-02 (×2): qty 2

## 2014-05-02 MED ORDER — ALUM & MAG HYDROXIDE-SIMETH 200-200-20 MG/5ML PO SUSP
30.0000 mL | ORAL | Status: DC | PRN
  Filled 2014-05-02: qty 30

## 2014-05-02 MED ORDER — ONDANSETRON HCL 4 MG PO TABS: 4.0000 mg | ORAL_TABLET | Freq: Three times a day (TID) | ORAL | Status: DC | PRN

## 2014-05-02 NOTE — [facility] Counselor (Signed)
Per Nanine MeansJamison Lord, NP, pt meets inpatient criteria.   [facility] so [facility] will seek placement elsewhere.       Cyndie MullAnna Toleen Lachapelle, MS, [facility] T[facility] Specialist - [facility]

## 2014-05-02 NOTE — [facility] Counselor (Signed)
TTS Counselor spoke with pt's RN in [facility] ([place]) and informed her that she was going to begin behavioral health assessment with pt momentarily. TTS Counselor reviewed pt's IVC paperwork as well in preparation for assessment.      Cyndie MullAnna Kele Barthelemy, MS, [facility] Specialist - [facility]

## 2014-05-02 NOTE — ED Notes (Addendum)
Patient just touched this writers butt. Patient is calm at the moment. Patient will probably put a fight to get belongings and dressed out; very possessive over belongings.

## 2014-05-02 NOTE — BH Assessment (Signed)
Tele Assessment Note   Molly [name] is an African-American, single, 28 y.o. female who presents to [facility] via [employer] under IVC. According to IVC paperwork, pt has been hearing voices telling her to kill people "when God tells me to". She has not yet attempted to harm anyone since hospital admission, but was reportedly agitated in Triage. Pt has since calmed down and lied in bed for the entire behavioral health assessment. Pt acknowledges continuing to hear voices while in [facility], and she was responding to internal stimuli throughout the entire assessment as evidenced by smiling and laughing inappropriately and often staring off into space as if listening to these voices; this often made it difficult for pt to maintain attention and answer questions posed by counselor. She also reported concern about the other patients laughing and talking about her and that the TTS counselor would do the same. Pt is a poor historian with poor insight into her mental illness. Pt presents with good eye-contact, soft speech, disorganized thought process with evidence of thought-blocking, and slightly inappropriate behavior towards TTS Counselor and one other nurse in the SAP[facility] so far since arriving here (i.e. boundary issues). Mood and affect were labile, alternating between smiling and laughing to suspiciousness and slight irritability.   Pt acknowledges some depression and wanting to just stay in bed and cry all day. She also does not maintain her hygiene, which is likely linked to her homelessness. Pt denies any current drug or alcohol use but states that she used to partake in these in the past. 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 Pt has a long history of psychosis and dx of schizophrenia. She has had multiple psychiatric hospitalizations but could not recall names or dates; we  do know that she received inpt treatment here at [facility] a little over 3 weeks ago for the same type of symptoms, as well as in Oct 2015. Pt does not endorse any hx of abuse and no access to weapons. Pt did not respond when questioned about SI or HI, but just smiled and stared at TTS Counselor. Pt was most concerned about getting her medications, which RN in [facility] already assured her were ordered and that she would receive them on time as scheduled. Pt seemed open to go to inpt psychiatric treatment.   Primary: 295.70 Schizoaffective Disorder Axis II: Deferred Axis III:  Past Medical History  Diagnosis Date  . Schizophrenia   . CERVICITIS, GONOCOCCAL, History of 01/09/2007    Qualifier: History of  By: McDiarmid MD, Tawanna Cooler    . ATTENTION DEFICIT, W/O HYPERACTIVITY, History of 06/30/2006    Qualifier: History of  By: McDiarmid MD, Tawanna Cooler    . Depression   . 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 Dennie Bible (Psych) At [facility] in  [place], [place]. Qualifier: Hospitalized for  By: McDiarmid MD, Tawanna Cooler    . 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 TOBACCO USER 02/08/2009    Qualifier: Diagnosis of  By: Knox Royalty    . 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 ASC-cannot exclude HGSIL on Pap 02/15/2012    ASC-US on 02/03/2012 pap (associated Trichomonas infection). No reflex HPV  testing performed on specimen.  Patient informed that she will need repeat Pap in one year.      . Diabetes mellitus     diet controlled  . Eczema   . Abnormal Pap smear    Axis IV: economic problems, housing problems, other psychosocial or environmental problems and problems related to social environment Axis V: 11-20 some danger of hurting self or others possible OR occasionally fails to maintain minimal personal hygiene OR gross impairment in communication  Past  Medical History:  Past Medical History  Diagnosis Date  . Schizophrenia   . CERVICITIS, GONOCOCCAL, History of 01/09/2007    Qualifier: History of  By: McDiarmid MD, Tawanna Coolerodd    . ATTENTION DEFICIT, W/O HYPERACTIVITY, History of 06/30/2006    Qualifier: History of  By: McDiarmid MD, Tawanna Coolerodd    . Depression   . 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 Dennie Bibleichard Larsen (Psych) At [facility] [place], [place]. Qualifier: Hospitalized for  By: McDiarmid MD, Tawanna Coolerodd    . 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 TOBACCO USER 02/08/2009    Qualifier: Diagnosis of  By: Knox Royaltydell, Erin    . 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 ASC-cannot exclude HGSIL on Pap 02/15/2012    ASC-US on 02/03/2012 pap (associated Trichomonas infection). No reflex HPV testing performed on specimen.  Patient informed that she will need repeat Pap in one year.      . Diabetes mellitus     diet controlled  . Eczema   . Abnormal Pap smear     Past Surgical History  Procedure Laterality Date  . Incision and drainage      pilanodal cyst    Family History:  Family History  Problem Relation Age of Onset  . Adopted: Yes  . Bipolar disorder Sister   . Alcohol abuse Brother   . Cancer Father   . Diabetes Mother     Social History:  reports that she has been smoking Cigarettes.  She has a 5 pack-year smoking history. She has never used smokeless tobacco. She reports that she drinks alcohol. She reports that she does not use illicit drugs.  Additional Social History:  Alcohol / Drug Use Pain Medications: See med list - none reported by pt Prescriptions: See med list Over the Counter: Pt denies History of alcohol / drug use?: Yes Longest period of sobriety (when/how long): Pt says she stopped drugs years ago Negative Consequences of Use:  (Pt did not disclose) Withdrawal  Symptoms:  (Pt denies) Substance #1 Name of Substance 1: Possibly alcohol (admitted in recent assessments); pt did not disclose names of other substances used in past 1 - Age of First Use: Unknown 1 - Amount (size/oz): Unknown 1 - Frequency: Unknown 1 - Duration: Unknown 1 - Last Use / Amount: "Years ago"  CIWA: CIWA-Ar BP: 143/99 mmHg Pulse Rate: 108 COWS:    PATIENT STRENGTHS: (choose at least two) Ability for insight Supportive family/friends  Allergies:  Allergies  Allergen Reactions  . Abilify [Aripiprazole] Other (See Comments)    Thinks its nasty- does not want it    Home Medications:  (Not in a hospital admission)  OB/GYN Status:  No LMP recorded. Patient is not currently having periods (Reason: Other).  General Assessment Data Location of Assessment: [facility]  Is this a Tele or Face-to-Face Assessment?: Face-to-Face Is this an Initial Assessment or a Re-assessment for this encounter?: Initial Assessment Living Arrangements: Other (Comment) (Homeless) Can pt return to current living arrangement?: Yes Admission Status: Involuntary Is patient capable of signing voluntary admission?: No Transfer from: Unknown Referral Source: [facility]     [facility] Living Arrangements: Other (Comment) (Homeless) Name of Psychiatrist: Unknown Name of Therapist: Unknown  Education Status Is patient currently in school?: No Current Grade: na Highest grade of school patient has completed: Unknown Name of school: na Contact person: na  Risk to self with the past 6 months Suicidal Ideation: No Suicidal Intent: No Is patient at risk for suicide?: Yes (Possibly; Pt was guarded and has hx of SI/HI) Suicidal Plan?: No Access to Means: No What has been your use of drugs/alcohol within the last 12 months?: Pt denies any Previous Attempts/Gestures: Yes How many times?: 1 (Unknown but at least 1x via OD) Other Self Harm Risks: Homeless,  Delusional Triggers for Past Attempts: Hallucinations Intentional Self Injurious Behavior: None Family Suicide History: Unknown Recent stressful life event(s): Other (Comment) (Pt says being out of her meds is a major stressor) Persecutory voices/beliefs?:  (Unknown) Depression: Yes Depression Symptoms: Tearfulness, Feeling angry/irritable Substance abuse history and/or treatment for substance abuse?: Yes Suicide prevention information given to non-admitted patients: Not applicable  Risk to Others within the past 6 months Homicidal Ideation: Yes-Currently Present (Pt would not respond but per IVC papers, pt is homicidal ) Thoughts of Harm to Others: Yes-Currently Present Comment - Thoughts of Harm to Others: Per IVC papers, thoughts to kill others when God tells her to Current Homicidal Intent:  (Pt did not respond; Unknown) Current Homicidal Plan: Yes-Currently Present Describe Current Homicidal Plan: To kill others "when God tells me to" Access to Homicidal Means:  (Unknown) Identified Victim: Unknown History of harm to others?: Yes Assessment of Violence: In past 6-12 months Violent Behavior Description: Pt says she has "temper tantrums" but would not elaborate on behaviors Does patient have access to weapons?: No Criminal Charges Pending?:  (Unknown) Does patient have a court date:  (Unknown)  Psychosis Hallucinations: Auditory, With command (Plus, suspected visual hallucinations as well) Delusions: Grandiose  Mental Status Report Appear/Hygiene: In scrubs Eye Contact: Good Motor Activity: Freedom of movement Speech: Soft, Slow Level of Consciousness: Quiet/awake Mood: Depressed, Labile, Suspicious, Pleasant Affect: Labile Anxiety Level: Minimal Thought Processes: Thought Blocking Judgement: Impaired Orientation: Person, Place Obsessive Compulsive Thoughts/Behaviors: Moderate  Cognitive Functioning Concentration: Poor Memory: Recent Impaired, Remote Impaired IQ:  Average Insight: Poor Impulse Control: Poor Appetite: Fair Weight Loss: 0 Weight Gain: 0 Sleep: No Change Total Hours of Sleep: 6 Vegetative Symptoms: Staying in bed, Not bathing, Decreased grooming  ADLScreening [facility] Assessment Services) Patient's cognitive ability adequate to safely complete daily activities?: No Patient able to express need for assistance with ADLs?: Yes Independently performs ADLs?: Yes (appropriate for developmental age)  Prior Inpatient Therapy Prior Inpatient Therapy: Yes Prior Therapy Dates: multiple times in 2015 [facility] Reason for Treatment: Schizophrenic Sx  Prior Outpatient Therapy Prior Outpatient Therapy:  Marcos Eke(Uknown) Prior Therapy Dates: na Prior Therapy Facilty/Provider(s): na Reason for Treatment: na  ADL Screening (condition at time of admission) Patient's cognitive ability adequate to safely complete daily activities?: No Is the patient deaf or have difficulty hearing?: No Does the patient have difficulty seeing, even when wearing glasses/contacts?: No Does the patient have difficulty concentrating, remembering, or making decisions?: Yes  Patient able to express need for assistance with ADLs?: Yes Does the patient have difficulty dressing or bathing?: No Independently performs ADLs?: Yes (appropriate for developmental age) Does the patient have difficulty walking or climbing stairs?: No Weakness of Legs: None Weakness of Arms/Hands: None  Home Assistive Devices/Equipment Home Assistive Devices/Equipment: None          Advance Directives (For Healthcare) Does patient have an advance directive?: No Would patient like information on creating an advanced directive?: No - patient declined information    Additional Information 1:1 In Past 12 Months?:  (Unknown) CIRT Risk: No Elopement Risk: No Does patient have medical clearance?: No     Disposition: Per Nanine Means, NP, Pt meets inpt treatment  criteria. No beds at [facility] so TTS will seek placement elsewhere. Disposition Initial Assessment Completed for this Encounter: Yes Disposition of Patient: Inpatient treatment program Type of inpatient treatment program: Adult  Cyndie Mull, [facility] Triage Specialist - [facility]  05/02/2014 9:37 PM

## 2014-05-02 NOTE — ED Notes (Signed)
[facility] Expected date:  Expected time:  Means of arrival:  Comments: Triage 3 Biagio BorgJessica Garrod

## 2014-05-02 NOTE — ED Notes (Signed)
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

## 2014-05-02 NOTE — ED Notes (Signed)
Patient has belongings in soiled utility room in a white plastic bag.

## 2014-05-02 NOTE — [facility] Counselor (Signed)
TTS Counselor informed pt's RN (Rashell) of disposition and need for placement at another [facility].  After speaking with RN, pt approached TTS Counselor clearly suffering from paranoid delusions, stating that other patients were mumbling things about her, planning to hurt her, and trying to come in her room while she is trying to sleep. Pt also asked TTS Counselor if she had hit her or shot her and said she had been hearing voices. Pt encouraged to tell a staff member if feeling unsafe.  Pt requested Counselor to come with her to nurses' station to inquire about her medications. Pt's RN informed her that the medications had already been ordered and she would be receiving them as scheduled.    Cyndie MullAnna Caylan Schifano, MS, [facility] Triage Specialist - [facility]

## 2014-05-02 NOTE — ED Notes (Signed)
Patient and belongings have been wanded.  

## 2014-05-03 DIAGNOSIS — Z3202 Encounter for pregnancy test, result negative: Secondary | ICD-10-CM | POA: Diagnosis not present

## 2014-05-03 DIAGNOSIS — Z79899 Other long term (current) drug therapy: Secondary | ICD-10-CM | POA: Diagnosis not present

## 2014-05-03 DIAGNOSIS — F29 Unspecified psychosis not due to a substance or known physiological condition: Secondary | ICD-10-CM | POA: Diagnosis not present

## 2014-05-03 DIAGNOSIS — F419 Anxiety disorder, unspecified: Secondary | ICD-10-CM | POA: Diagnosis not present

## 2014-05-03 DIAGNOSIS — Z872 Personal history of diseases of the skin and subcutaneous tissue: Secondary | ICD-10-CM | POA: Diagnosis not present

## 2014-05-03 DIAGNOSIS — F200 Paranoid schizophrenia: Secondary | ICD-10-CM | POA: Diagnosis not present

## 2014-05-03 DIAGNOSIS — Z8619 Personal history of other infectious and parasitic diseases: Secondary | ICD-10-CM | POA: Diagnosis not present

## 2014-05-03 DIAGNOSIS — F329 Major depressive disorder, single episode, unspecified: Secondary | ICD-10-CM | POA: Diagnosis not present

## 2014-05-03 DIAGNOSIS — E119 Type 2 diabetes mellitus without complications: Secondary | ICD-10-CM | POA: Diagnosis not present

## 2014-05-03 DIAGNOSIS — Z720 Tobacco use: Secondary | ICD-10-CM | POA: Diagnosis not present

## 2014-05-03 LAB — PREGNANCY, URINE: Preg Test, Ur: NEGATIVE

## 2014-05-03 MED ORDER — DIPHENHYDRAMINE HCL 50 MG/ML IJ SOLN
25.0000 mg | Freq: Once | INTRAMUSCULAR | Status: AC
  Administered 2014-05-03: 25 mg via INTRAMUSCULAR
  Filled 2014-05-03: qty 1

## 2014-05-03 MED ORDER — DIPHENHYDRAMINE HCL 50 MG/ML IJ SOLN
50.0000 mg | Freq: Once | INTRAMUSCULAR | Status: AC
  Administered 2014-05-03: 50 mg via INTRAMUSCULAR
  Filled 2014-05-03: qty 1

## 2014-05-03 MED ORDER — LORAZEPAM 2 MG/ML IJ SOLN
2.0000 mg | Freq: Once | INTRAMUSCULAR | Status: AC
  Administered 2014-05-03: 2 mg via INTRAVENOUS
  Filled 2014-05-03: qty 1

## 2014-05-03 MED ORDER — ZIPRASIDONE MESYLATE 20 MG IM SOLR
20.0000 mg | Freq: Once | INTRAMUSCULAR | Status: AC
  Administered 2014-05-03: 20 mg via INTRAMUSCULAR
  Filled 2014-05-03: qty 20

## 2014-05-03 MED ORDER — HALOPERIDOL DECANOATE 100 MG/ML IM SOLN
100.0000 mg | Freq: Once | INTRAMUSCULAR | Status: AC
  Administered 2014-05-03: 100 mg via INTRAMUSCULAR
  Filled 2014-05-03: qty 1

## 2014-05-03 MED ORDER — LORAZEPAM 2 MG/ML IJ SOLN
2.0000 mg | Freq: Once | INTRAMUSCULAR | Status: AC
  Administered 2014-05-03: 2 mg via INTRAMUSCULAR
  Filled 2014-05-03: qty 1

## 2014-05-03 NOTE — BH Assessment (Signed)
Molly [name], AC at [facility], confirms adult unit is currently at capacity. Contacted the following facilities for placement:  BED AVAILABLE, FAXED CLINICAL INFORMATION: [facility], per [facility], per [facility], per John [facility], per Donna [facility], per [facility], per Hannah [facility], per L[facility]  AT CAPACITY: [facility], per [facility], per [facility], per [facility], per Bill [facility], per [facility], per [facility], per [facility], per Dave [facility], per G[facility], per Barbara  NO RESPONSE: [facility]  PT DECLINED: [facility]  Molly [name], LPC, [facility] Triage Specialist [phone] 

## 2014-05-03 NOTE — BH Assessment (Signed)
[facility] called denying Pt citing that Pt needs a higher level of care.  Harlin Rain Ria Comment, [facility] Triage Specialist [phone]

## 2014-05-03 NOTE — ED Notes (Addendum)
Pt agitated, screaming at staff members, slamming doors, unredirectable. PA Maryjean Morn called for orders.  Security and [employer] at bedside for assistance. RN assisting primary nurse RN Lowella Bandy.

## 2014-05-03 NOTE — Consult Note (Signed)
[facility] Face-to-Face Psychiatry Consult   Reason for Consult:  Psychosis Referring Physician:  EDP Molly [name] is an 29 y.o. female. Total Time spent with patient: 1 hour  Assessment: DSM5 Psychosis   Past Medical History  Diagnosis Date  . Schizophrenia   . CERVICITIS, GONOCOCCAL, History of 01/09/2007    Qualifier: History of  By: McDiarmid MD, Sherren Mocha    . ATTENTION DEFICIT, W/O HYPERACTIVITY, History of 06/30/2006    Qualifier: History of  By: McDiarmid MD, Sherren Mocha    . Depression   . 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 Henrene Dodge (Psych) At [facility] in  [place], [place]. Qualifier: Hospitalized for  By: McDiarmid MD, Sherren Mocha    . 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 TOBACCO USER 02/08/2009    Qualifier: Diagnosis of  By: Samara Snide    . 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 ASC-cannot exclude HGSIL on Pap 02/15/2012    ASC-US on 02/03/2012 pap (associated Trichomonas infection). No reflex HPV testing performed on specimen.  Patient informed that she will need repeat Pap in one year.      . Diabetes mellitus     diet controlled  . Eczema   . Abnormal Pap smear    Plan:  Recommend psychiatric Inpatient admission when medically cleared.  Subjective:   Molly [name] is a 29 y.o. female patient admitted with Psychosis IVC by the Police.  HPI:  AA female, 29 years old was seen this am for agitation and aggression.  Patient  Was discharged from our inpatient Psychiatric unit December 16th after treatment for Psychosis.   Back in October patient was also hospitalized for same disorder.  Today patient did not want to communicate with providers during her assessment.  She had her blanket over her face and stated that the Police brought her in.  When asked why the Police brought her in she stated she was homeless and was  looking for a place to stay.  Patient have not taken any medications since her discharge from the hospital.  Patient asked providers to leave her room so she could sleep.  Then patient found out that her biological brother was in another room calmed down and was minimally cooperative.  She became agitated and aggressive when she saw a nurse wearing the same king of hair comb and she started accusing the nurse of stealing her hair piece.  Patient was medicated at that time with Ativan.  She remained disruptive causing other patients to close their doors.  Patient sat on the floor in the middle of the hall way and ate her breakfast.  When her brother was discharged patient became very angry and started throwing thing in the unit.  Geodon was given.  At the time of this documentation patient is still sleeping.  We have accepted patient for admission and will be looking for placement at any facility with available bed since we are at capacity.  We have resumed all of her home medications.  Patient did not respond  HPI Elements:   Location:  Psychosis . Quality:  Agitated, aggressive, medication non compliance, treatment non compliance with treatment and medications.. Severity:  severe. Timing:  ongoing, recently discharged from the hospital. Duration:  Chronic mental illness. Context:  IVC and brought in by the  Police for Psychosis.  Past Psychiatric History: Past Medical History  Diagnosis Date  . Schizophrenia   . CERVICITIS, GONOCOCCAL, History of 01/09/2007    Qualifier: History of  By: McDiarmid MD, Sherren Mocha    . ATTENTION DEFICIT, W/O HYPERACTIVITY, History of 06/30/2006    Qualifier: History of  By: McDiarmid MD, Sherren Mocha    . Depression   . 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 Henrene Dodge (Psych) At [facility] in  [place], [place]. Qualifier: Hospitalized for  By: McDiarmid MD, Sherren Mocha    . 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 TOBACCO USER 02/08/2009    Qualifier: Diagnosis of  By: Samara Snide    . 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 ASC-cannot exclude HGSIL on Pap 02/15/2012    ASC-US on 02/03/2012 pap (associated Trichomonas infection). No reflex HPV testing performed on specimen.  Patient informed that she will need repeat Pap in one year.      . Diabetes mellitus     diet controlled  . Eczema   . Abnormal Pap smear     reports that she has been smoking Cigarettes.  She has a 5 pack-year smoking history. She has never used smokeless tobacco. She reports that she drinks alcohol. She reports that she does not use illicit drugs. Family History  Problem Relation Age of Onset  . Adopted: Yes  . Bipolar disorder Sister   . Alcohol abuse Brother   . Cancer Father   . Diabetes Mother    Family History Substance Abuse: Yes, Describe: (Pt reports alcohol and/or SA in past; Says she quit yrs ago) Family Supports: Yes, List: (Says she has family in area, and her girlfriend) Living Arrangements: Other (Comment) (Homeless) Can pt return to current living arrangement?: Yes   Allergies:   Allergies  Allergen Reactions  . Abilify [Aripiprazole] Other (See Comments)    Thinks its nasty- does not want it    ACT Assessment Complete:  Yes:    Educational Status    Risk to Self: Risk to self with the past 6 months Suicidal Ideation: No Suicidal Intent: No Is patient at risk for suicide?: Yes (Possibly; Pt was guarded and has hx of SI/HI) Suicidal Plan?: No Access to Means: No What has been your use of drugs/alcohol within the last 12 months?: Pt denies any Previous Attempts/Gestures: Yes How many times?: 1 (Unknown but at least 1x via OD) Other Self Harm Risks: Homeless, Delusional Triggers for Past Attempts: Hallucinations Intentional Self Injurious Behavior: None Family Suicide History: Unknown Recent  stressful life event(s): Other (Comment) (Pt says being out of her meds is a major stressor) Persecutory voices/beliefs?:  (Unknown) Depression: Yes Depression Symptoms: Tearfulness, Feeling angry/irritable Substance abuse history and/or treatment for substance abuse?: Yes Suicide prevention information given to non-admitted patients: Not applicable  Risk to Others: Risk to Others within the past 6 months Homicidal Ideation: Yes-Currently Present (Pt would not respond but per IVC papers, pt is homicidal ) Thoughts of Harm to Others: Yes-Currently Present Comment - Thoughts of Harm to Others: Per IVC papers, thoughts to kill others when God tells her to Current Homicidal Intent:  (Pt did not respond; Unknown) Current Homicidal Plan: Yes-Currently Present Describe Current Homicidal Plan: To kill others "when God tells me to" Access to Homicidal Means:  (  Unknown) Identified Victim: Unknown History of harm to others?: Yes Assessment of Violence: In past 6-12 months Violent Behavior Description: Pt says she has "temper tantrums" but would not elaborate on behaviors Does patient have access to weapons?: No Criminal Charges Pending?:  (Unknown) Does patient have a court date:  (Unknown)  Abuse:    Prior Inpatient Therapy: Prior Inpatient Therapy Prior Inpatient Therapy: Yes Prior Therapy Dates: multiple times in 2015 Prior Therapy Facilty/Provider(s): [facility] Reason for Treatment: Schizophrenic Sx  Prior Outpatient Therapy: Prior Outpatient Therapy Prior Outpatient Therapy:  Lesia Sago) Prior Therapy Dates: na Prior Therapy Facilty/Provider(s): na Reason for Treatment: na  Additional Information: Additional Information 1:1 In Past 12 Months?:  (Unknown) CIRT Risk: No Elopement Risk: No Does patient have medical clearance?: No    Objective: Blood pressure 117/78, pulse 112, temperature 98.4 F (36.9 C), temperature source Oral, resp. rate 18, SpO2 100 %.There is no weight on file to  calculate BMI. Results for orders placed or performed during the hospital encounter of 05/02/14 (from the past 72 hour(s))  Acetaminophen level     Status: Abnormal   Collection Time: 05/02/14  6:59 PM  Result Value Ref Range   Acetaminophen (Tylenol), Serum <10.0 (L) 10 - 30 ug/mL    Comment:        THERAPEUTIC CONCENTRATIONS VARY SIGNIFICANTLY. A RANGE OF 10-30 ug/mL MAY BE AN EFFECTIVE CONCENTRATION FOR MANY PATIENTS. HOWEVER, SOME ARE BEST TREATED AT CONCENTRATIONS OUTSIDE THIS RANGE. ACETAMINOPHEN CONCENTRATIONS >150 ug/mL AT 4 HOURS AFTER INGESTION AND >50 ug/mL AT 12 HOURS AFTER INGESTION ARE OFTEN ASSOCIATED WITH TOXIC REACTIONS.   CBC     Status: Abnormal   Collection Time: 05/02/14  6:59 PM  Result Value Ref Range   WBC 12.7 (H) 4.0 - 10.5 K/uL   RBC 4.34 3.87 - 5.11 MIL/uL   Hemoglobin 12.4 12.0 - 15.0 g/dL   HCT 37.3 36.0 - 46.0 %   MCV 85.9 78.0 - 100.0 fL   MCH 28.6 26.0 - 34.0 pg   MCHC 33.2 30.0 - 36.0 g/dL   RDW 14.8 11.5 - 15.5 %   Platelets 352 150 - 400 K/uL  Comprehensive metabolic panel     Status: None   Collection Time: 05/02/14  6:59 PM  Result Value Ref Range   Sodium 137 135 - 145 mmol/L    Comment: Please note change in reference range.   Potassium 3.7 3.5 - 5.1 mmol/L    Comment: Please note change in reference range.   Chloride 102 96 - 112 mEq/L   CO2 28 19 - 32 mmol/L   Glucose, Bld 99 70 - 99 mg/dL   BUN 7 6 - 23 mg/dL   Creatinine, Ser 0.75 0.50 - 1.10 mg/dL   Calcium 9.2 8.4 - 10.5 mg/dL   Total Protein 8.2 6.0 - 8.3 g/dL   Albumin 4.2 3.5 - 5.2 g/dL   AST 18 0 - 37 U/L   ALT 13 0 - 35 U/L   Alkaline Phosphatase 81 39 - 117 U/L   Total Bilirubin 0.4 0.3 - 1.2 mg/dL   GFR calc non Af Amer >90 >90 mL/min   GFR calc Af Amer >90 >90 mL/min    Comment: (NOTE) The eGFR has been calculated using the CKD EPI equation. This calculation has not been validated in all clinical situations. eGFR's persistently <90 mL/min signify possible  Chronic Kidney Disease.    Anion gap 7 5 - 15  Ethanol (ETOH)     Status:  None   Collection Time: 05/02/14  6:59 PM  Result Value Ref Range   Alcohol, Ethyl (B) <5 0 - 9 mg/dL    Comment:        LOWEST DETECTABLE LIMIT FOR SERUM ALCOHOL IS 11 mg/dL FOR MEDICAL PURPOSES ONLY   Salicylate level     Status: None   Collection Time: 05/02/14  6:59 PM  Result Value Ref Range   Salicylate Lvl <2.3 2.8 - 20.0 mg/dL  Urine Drug Screen     Status: None   Collection Time: 05/02/14  7:04 PM  Result Value Ref Range   Opiates NONE DETECTED NONE DETECTED   Cocaine NONE DETECTED NONE DETECTED   Benzodiazepines NONE DETECTED NONE DETECTED   Amphetamines NONE DETECTED NONE DETECTED   Tetrahydrocannabinol NONE DETECTED NONE DETECTED   Barbiturates NONE DETECTED NONE DETECTED    Comment:        DRUG SCREEN FOR MEDICAL PURPOSES ONLY.  IF CONFIRMATION IS NEEDED FOR ANY PURPOSE, NOTIFY LAB WITHIN 5 DAYS.        LOWEST DETECTABLE LIMITS FOR URINE DRUG SCREEN Drug Class       Cutoff (ng/mL) Amphetamine      1000 Barbiturate      200 Benzodiazepine   762 Tricyclics       831 Opiates          300 Cocaine          300 THC              50   Pregnancy, urine     Status: None   Collection Time: 05/03/14  4:27 AM  Result Value Ref Range   Preg Test, Ur NEGATIVE NEGATIVE    Comment:        THE SENSITIVITY OF THIS METHODOLOGY IS >20 mIU/mL.    Labs are reviewed and are pertinent for Uremarkable.  Current Facility-Administered Medications  Medication Dose Route Frequency Provider Last Rate Last Dose  . acetaminophen (TYLENOL) tablet 650 mg  650 mg Oral Q4H PRN Resa Miner Lawyer, PA-C      . alum & mag hydroxide-simeth (MAALOX/MYLANTA) 200-200-20 MG/5ML suspension 30 mL  30 mL Oral PRN Resa Miner Lawyer, PA-C      . benztropine (COGENTIN) tablet 0.5 mg  0.5 mg Oral BID Resa Miner Lawyer, PA-C   0.5 mg at 05/03/14 1031  . divalproex (DEPAKOTE ER) 24 hr tablet 750 mg  750 mg Oral BID  Resa Miner Lawyer, PA-C   750 mg at 05/03/14 1031  . haloperidol (HALDOL) tablet 10 mg  10 mg Oral BID Resa Miner Lawyer, PA-C   10 mg at 05/03/14 1031  . ibuprofen (ADVIL,MOTRIN) tablet 600 mg  600 mg Oral Q8H PRN Resa Miner Lawyer, PA-C      . LORazepam (ATIVAN) tablet 1 mg  1 mg Oral Q8H PRN Resa Miner Lawyer, PA-C      . ondansetron [facility]) tablet 4 mg  4 mg Oral Q8H PRN Resa Miner Lawyer, PA-C      . traZODone (DESYREL) tablet 100 mg  100 mg Oral QHS Resa Miner Lawyer, PA-C   100 mg at 05/02/14 2158  . zolpidem (AMBIEN) tablet 5 mg  5 mg Oral QHS PRN Brent [facility], PA-C       Current Outpatient Prescriptions  Medication Sig Dispense Refill  . benztropine (COGENTIN) 0.5 MG tablet Take 1 tablet (0.5 mg total) by mouth 2 (two) times daily. 60 tablet 0  . [START ON 05/16/2014] benztropine  mesylate (COGENTIN) 1 MG/ML injection Inject 0.5 mLs (0.5 mg total) into the muscle every 30 (thirty) days. First dose received on 04/16/15 with next dose due in thirty days. 2 mL 0  . divalproex (DEPAKOTE ER) 250 MG 24 hr tablet Take 3 tablets (750 mg total) by mouth 2 (two) times daily. 180 tablet 0  . traZODone (DESYREL) 100 MG tablet Take 1 tablet (100 mg total) by mouth at bedtime. 30 tablet 0  . haloperidol (HALDOL) 10 MG tablet Take 1 tablet (10 mg total) by mouth 2 (two) times daily. 60 tablet 0  . [START ON 05/16/2014] haloperidol decanoate (HALDOL DECANOATE) 100 MG/ML injection Inject 1 mL (100 mg total) into the muscle every 30 (thirty) days. (Patient not taking: Reported on 05/02/2014) 1 mL 0    Psychiatric Specialty Exam:     Blood pressure 117/78, pulse 112, temperature 98.4 F (36.9 C), temperature source Oral, resp. rate 18, SpO2 100 %.There is no weight on file to calculate BMI.  General Appearance: Casual and Disheveled  Eye Contact::  None  Speech:  Pressured  Volume:  Increased  Mood:  Angry, Anxious, Euphoric and Irritable  Affect:  Inappropriate, Labile and  Full Range  Thought Process:  Circumstantial, Disorganized and Tangential  Orientation:  Full (Time, Place, and Person)  Thought Content:  Paranoid Ideation  Suicidal Thoughts:  No  Homicidal Thoughts:  No  Memory:  Immediate;   Fair Recent;   Poor Remote;   Poor  Judgement:  Impaired  Insight:  Lacking  Psychomotor Activity:  Increased and Restlessness  Concentration:  Poor  Recall:  NA  Fund of Knowledge:Poor  Language: Poor  Akathisia:  NA  Handed:  Right  AIMS (if indicated):     Assets:  Desire for Improvement Financial Resources/Insurance Housing  Sleep:      Musculoskeletal: Strength & Muscle Tone: within normal limits Gait & Station: normal Patient leans: N/A  Treatment Plan Summary: Daily contact with patient to assess and evaluate symptoms and progress in treatment Medication management  Delfin Gant   PMHNP-BC 05/03/2014 4:21 PM  I have personally seen the patient and agreed with the findings and involved in the treatment plan. Merian Capron, MD

## 2014-05-03 NOTE — BH Assessment (Signed)
[facility] Assessment Progress Note  The following attempts have been made to place this pt with results as noted:  *[facility]: at capacity  *[facility]: at capacity  *[facility]: at capacity  *[facility]: beds available, referral faxed with decision pending.  Doylene Canning, MA  Triage Specialist  05/03/2014 @ 16:46

## 2014-05-03 NOTE — ED Notes (Signed)
Pt asked staff if she could use her personal soap because the hospital soap gave her a rash. Pt. Asked staff to come with her in her room. Pt showed staff her rash, on her inner thigh and her breasts. Staff asked nurse was it ok to let pt use her personal soap. Nurse agreed under the condition that pt return the soap after use. Staff relayed that to the pt and she agreed. Staff allowed pt to go through her bags bc she wasn't sure where her soap was. She began to look through her things. Pt then took personal items out of her belongings and refused to put them back. She went to the bathroom and took a shower. Staff called security. Staff asked pt to return the belongings she declined. When pt was informed of security being on the unit she gave staff her items but she had changed into her street clothes. and refused to change into scrubs. Pt then walked into her room and saw that she had no juice. She yelled "who took my juice" she took the bedside table and pushed it against the door and broke it off the hinges and broke part of the bedside table. But continued to yell at staff. After talking to the police officer, for several minutes, she decided to change back into her scrubs.

## 2014-05-03 NOTE — ED Provider Notes (Addendum)
CSN: [id number]     Arrival date & time 05/02/14  1836 History   First MD Initiated Contact with Patient 05/02/14 1932     Chief Complaint  Patient presents with  . Suicidal     (Consider location/radiation/quality/duration/timing/severity/associated sxs/prior Treatment) HPI Patient presents emergency department for IVC commitment.  She will not give me any history.  The patient apparently is suicidal and is threatening others, based on her IVC paperwork. Past Medical History  Diagnosis Date  . Schizophrenia   . CERVICITIS, GONOCOCCAL, History of 01/09/2007    Qualifier: History of  By: McDiarmid MD, Tawanna Cooler    . ATTENTION DEFICIT, W/O HYPERACTIVITY, History of 06/30/2006    Qualifier: History of  By: McDiarmid MD, Tawanna Cooler    . Depression   . 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 Dennie Bible (Psych) At [facility] in  [place], [place]. Qualifier: Hospitalized for  By: McDiarmid MD, Tawanna Cooler    . 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 TOBACCO USER 02/08/2009    Qualifier: Diagnosis of  By: Knox Royalty    . 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 ASC-cannot exclude HGSIL on Pap 02/15/2012    ASC-US on 02/03/2012 pap (associated Trichomonas infection). No reflex HPV testing performed on specimen.  Patient informed that she will need repeat Pap in one year.      . Diabetes mellitus     diet controlled  . Eczema   . Abnormal Pap smear    Past Surgical History  Procedure Laterality Date  . Incision and drainage      pilanodal cyst   Family History  Problem Relation Age of Onset  . Adopted: Yes  . Bipolar disorder Sister   . Alcohol abuse Brother   . Cancer Father   . Diabetes Mother    History  Substance Use Topics  . Smoking status: Current Every Day Smoker -- 0.50 packs/day for 10 years    Types: Cigarettes  . Smokeless  tobacco: Never Used  . Alcohol Use: Yes     Comment: occ   OB History    Gravida Para Term Preterm AB TAB SAB Ectopic Multiple Living   Review of Systems Level V caveat applies due to uncooperativeness   Allergies  Abilify  Home Medications   Prior to Admission medications   Medication Sig Start Date End Date Taking? Authorizing Provider  benztropine (COGENTIN) 0.5 MG tablet Take 1 tablet (0.5 mg total) by mouth 2 (two) times daily. 04/17/14  Yes Fransisca Kaufmann, NP  benztropine mesylate (COGENTIN) 1 MG/ML injection Inject 0.5 mLs (0.5 mg total) into the muscle every 30 (thirty) days. First dose received on 04/16/15 with next dose due in thirty days. 05/16/14  Yes Fransisca Kaufmann, NP  divalproex (DEPAKOTE ER) 250 MG 24 hr tablet Take 3 tablets (750 mg total) by mouth 2 (two) times daily. 04/17/14  Yes Fransisca Kaufmann, NP  traZODone (DESYREL) 100 MG tablet Take 1 tablet (100 mg total) by mouth at bedtime. 04/17/14  Yes Fransisca Kaufmann, NP  haloperidol (HALDOL) 10 MG tablet Take 1 tablet (10 mg total) by mouth 2 (two) times daily. 04/17/14   Fransisca Kaufmann, NP  haloperidol decanoate (HALDOL DECANOATE) 100 MG/ML injection Inject 1  mL (100 mg total) into the muscle every 30 (thirty) days. Patient not taking: Reported on 05/02/2014 05/16/14   Fransisca Kaufmann, NP   BP 143/99 mmHg  Pulse 108  Temp(Src) 98.5 F (36.9 C) (Oral)  Resp 18  SpO2 99% Physical Exam  Constitutional: She appears well-developed and well-nourished. No distress.  HENT:  Head: Normocephalic and atraumatic.  Mouth/Throat: Oropharynx is clear and moist.  Eyes: Pupils are equal, round, and reactive to light.  Neck: Normal range of motion. Neck supple.  Cardiovascular: Normal rate, regular rhythm and normal heart sounds.  Exam reveals no gallop and no friction rub.   No murmur heard. Pulmonary/Chest: Effort normal and breath sounds normal. No respiratory distress.  Musculoskeletal: She exhibits no edema.  Neurological:  She is alert. She exhibits normal muscle tone. Coordination normal.  Skin: Skin is warm and dry.  Psychiatric: Her mood appears anxious. She expresses suicidal ideation.  Nursing note and vitals reviewed.   ED Course  Procedures (including critical care time) Labs Review Labs Reviewed  ACETAMINOPHEN LEVEL - Abnormal; Notable for the following:    Acetaminophen (Tylenol), Serum <10.0 (*)    All other components within normal limits  CBC - Abnormal; Notable for the following:    WBC 12.7 (*)    All other components within normal limits  COMPREHENSIVE METABOLIC PANEL  ETHANOL  SALICYLATE LEVEL  URINE RAPID DRUG SCREEN (HOSP PERFORMED)   Patient will need psychiatric evaluation    Carlyle Dolly, PA-C 05/03/14 0039  Flint Melter, MD 05/04/14 1054  Carlyle Dolly, PA-C 05/05/14 [id number]  Flint Melter, MD 05/05/14 [phone]

## 2014-05-03 NOTE — ED Notes (Signed)
11:15 Pt is requesting her belongings for lip gloss, advised pt that we are unable to go back into belongings until discharge. Pt was visiting other pts rooms was redirected to dayroom or her room, she then became agaited and aggressive. Pt was yelling "you stole my hair band"  And "take it off".    11:48 pt is upset do to her bothers discharge. She started throwing the phone across the unit. MD is aware will. Continue to monitor for safety.

## 2014-05-04 DIAGNOSIS — F29 Unspecified psychosis not due to a substance or known physiological condition: Secondary | ICD-10-CM | POA: Insufficient documentation

## 2014-05-04 MED ORDER — LORAZEPAM 1 MG PO TABS
2.0000 mg | ORAL_TABLET | Freq: Once | ORAL | Status: AC
  Administered 2014-05-04: 2 mg via ORAL
  Filled 2014-05-04: qty 2

## 2014-05-04 MED ORDER — ZIPRASIDONE MESYLATE 20 MG IM SOLR
20.0000 mg | Freq: Once | INTRAMUSCULAR | Status: AC
  Administered 2014-05-04: 20 mg via INTRAMUSCULAR
  Filled 2014-05-04: qty 20

## 2014-05-04 MED ORDER — OLANZAPINE 10 MG IM SOLR
10.0000 mg | Freq: Once | INTRAMUSCULAR | Status: AC
  Administered 2014-05-04: 10 mg via INTRAMUSCULAR
  Filled 2014-05-04: qty 10

## 2014-05-04 MED ORDER — LORAZEPAM 2 MG/ML IJ SOLN
2.0000 mg | Freq: Once | INTRAMUSCULAR | Status: AC
  Administered 2014-05-04: 2 mg via INTRAMUSCULAR
  Filled 2014-05-04: qty 1

## 2014-05-04 MED ORDER — DIVALPROEX SODIUM ER 250 MG PO TB24
750.0000 mg | ORAL_TABLET | Freq: Two times a day (BID) | ORAL | Status: DC
  Administered 2014-05-05: 750 mg via ORAL
  Filled 2014-05-04 (×3): qty 3

## 2014-05-04 MED ORDER — DIPHENHYDRAMINE HCL 50 MG/ML IJ SOLN
50.0000 mg | Freq: Once | INTRAMUSCULAR | Status: AC
  Administered 2014-05-04: 50 mg via INTRAMUSCULAR
  Filled 2014-05-04: qty 1

## 2014-05-04 NOTE — ED Notes (Signed)
Pt refused her medications and yelled at staff stating, "I'm not taking that.  You're trying to make me fat.  I only take the blue pill and the little white pill.  Now I'm getting upset!"

## 2014-05-04 NOTE — ED Notes (Signed)
Pt room smells like urine. Staff observed a large wet spot on pts bed. When asked if she would like for staff to clean her bed and give her fresh blankets, she declined to have her fitted sheet changed and only wanted fresh blankets.

## 2014-05-04 NOTE — ED Notes (Addendum)
15:00 Patient is not redirectable, she went into room 36 and took the urine collection cup.  Pt is making verbal threats when asked to stay on her side of the unit.   15:03Pt is irritable and yelling out.Pt is upset with RN and is requesting cake and extra food. Pt then pulled the signature pad from the computer and threw the sig pad against the glass. Pt was aiming to Teacher, adult education. Redirected to room. Will continue to monitor for safety.   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

## 2014-05-04 NOTE — ED Notes (Signed)
Patient standing in front of other patients room on other side of the hall asking to use the phone.  When redirected back to her hall and NS for the phone patient became angry and threw a cup of ice in the hall.
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
## 2014-05-04 NOTE — Progress Notes (Signed)
CSW faxed patient referral to the following facilities:  Pending: [facility]    CSW followed up at previous referral to  Denied due to no female beds [facility]   Adelene Amas, [name] Disposition Social Worker [phone]

## 2014-05-04 NOTE — ED Notes (Signed)
Pt is awake and alert, pt urinated on the floor reports states she was unable to "hold it." will continue to monitor for safety.

## 2014-05-04 NOTE — ED Notes (Signed)
Depakote  taken at 08:35 pt will only take white pills. Pharmacy was notified. Will continue to monitor for safety.

## 2014-05-05 DIAGNOSIS — F29 Unspecified psychosis not due to a substance or known physiological condition: Secondary | ICD-10-CM | POA: Diagnosis not present

## 2014-05-05 MED ORDER — DIPHENHYDRAMINE HCL 50 MG/ML IJ SOLN
50.0000 mg | Freq: Once | INTRAMUSCULAR | Status: AC
  Administered 2014-05-05: 50 mg via INTRAMUSCULAR
  Filled 2014-05-05: qty 1

## 2014-05-05 MED ORDER — LORAZEPAM 2 MG/ML IJ SOLN: 2.0000 mg | Freq: Once | INTRAMUSCULAR | Status: DC

## 2014-05-05 MED ORDER — LORAZEPAM 2 MG/ML IJ SOLN
2.0000 mg | Freq: Once | INTRAMUSCULAR | Status: AC
  Administered 2014-05-05: 2 mg via INTRAMUSCULAR
  Filled 2014-05-05: qty 1

## 2014-05-05 MED ORDER — DIVALPROEX SODIUM 125 MG PO CPSP
1000.0000 mg | ORAL_CAPSULE | Freq: Two times a day (BID) | ORAL | Status: DC
  Administered 2014-05-05 – 2014-05-07 (×5): 1000 mg via ORAL
  Filled 2014-05-05 (×6): qty 8

## 2014-05-05 MED ORDER — LORAZEPAM 1 MG PO TABS
2.0000 mg | ORAL_TABLET | Freq: Four times a day (QID) | ORAL | Status: DC
  Administered 2014-05-05 (×4): 2 mg via ORAL
  Filled 2014-05-05 (×4): qty 2

## 2014-05-05 MED ORDER — CHLORPROMAZINE HCL 25 MG/ML IJ SOLN
100.0000 mg | Freq: Once | INTRAMUSCULAR | Status: DC
  Filled 2014-05-05: qty 4

## 2014-05-05 MED ORDER — DIPHENHYDRAMINE HCL 50 MG/ML IJ SOLN
100.0000 mg | Freq: Once | INTRAMUSCULAR | Status: AC
  Administered 2014-05-05: 100 mg via INTRAMUSCULAR
  Filled 2014-05-05: qty 2

## 2014-05-05 MED ORDER — ZIPRASIDONE MESYLATE 20 MG IM SOLR
20.0000 mg | Freq: Once | INTRAMUSCULAR | Status: AC
  Administered 2014-05-05: 20 mg via INTRAMUSCULAR
  Filled 2014-05-05: qty 20

## 2014-05-05 MED ORDER — NICOTINE POLACRILEX 2 MG MT GUM: 2.0000 mg | CHEWING_GUM | OROMUCOSAL | Status: DC | PRN

## 2014-05-05 MED ORDER — DIPHENHYDRAMINE HCL 50 MG/ML IJ SOLN: 50.0000 mg | Freq: Once | INTRAMUSCULAR | Status: DC

## 2014-05-05 MED ORDER — QUETIAPINE FUMARATE 100 MG PO TABS
100.0000 mg | ORAL_TABLET | Freq: Two times a day (BID) | ORAL | Status: DC
  Administered 2014-05-05 – 2014-05-07 (×5): 100 mg via ORAL
  Filled 2014-05-05 (×5): qty 1

## 2014-05-05 MED ORDER — DIPHENHYDRAMINE HCL 50 MG/ML IJ SOLN: 50.0000 mg | Freq: Once | INTRAMUSCULAR | Status: AC | PRN

## 2014-05-05 MED ORDER — NICOTINE 21 MG/24HR TD PT24
21.0000 mg | MEDICATED_PATCH | Freq: Every day | TRANSDERMAL | Status: DC
  Administered 2014-05-05 – 2014-05-06 (×2): 21 mg via TRANSDERMAL
  Filled 2014-05-05 (×2): qty 1

## 2014-05-05 MED ORDER — CHLORPROMAZINE HCL 25 MG/ML IJ SOLN
100.0000 mg | Freq: Once | INTRAMUSCULAR | Status: AC
  Administered 2014-05-05: 100 mg via INTRAMUSCULAR
  Filled 2014-05-05: qty 4

## 2014-05-05 MED ORDER — CHLORPROMAZINE HCL 25 MG/ML IJ SOLN: 100.0000 mg | Freq: Once | INTRAMUSCULAR | Status: AC | PRN

## 2014-05-05 MED ORDER — LORAZEPAM 2 MG/ML IJ SOLN: 2.0000 mg | Freq: Once | INTRAMUSCULAR | Status: AC | PRN

## 2014-05-05 MED ORDER — DIVALPROEX SODIUM 500 MG PO DR TAB
1000.0000 mg | DELAYED_RELEASE_TABLET | Freq: Two times a day (BID) | ORAL | Status: DC
  Filled 2014-05-05: qty 2

## 2014-05-05 MED ORDER — HALOPERIDOL LACTATE 5 MG/ML IJ SOLN: 5.0000 mg | Freq: Three times a day (TID) | INTRAMUSCULAR | Status: DC

## 2014-05-05 MED ORDER — HALOPERIDOL 5 MG PO TABS
5.0000 mg | ORAL_TABLET | Freq: Three times a day (TID) | ORAL | Status: DC
  Filled 2014-05-05: qty 1
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
## 2014-05-05 NOTE — Progress Notes (Signed)
CSW spoke with Tiburcio Pea at [facility] who reported patient has been prioritized.  Tiburcio Pea reported she is on the top of the list to be admitted.    Adelene Amas, [name] Disposition Social Worker [phone]

## 2014-05-05 NOTE — Consult Note (Signed)
[facility] Face-to-Face Psychiatry Consult   Reason for Consult:  Psychosis Referring Physician:  EDP Molly [name] is an 29 y.o. female. Total Time spent with patient: 30 minutes  Assessment: DSM5 Psychosis   Past Medical History  Diagnosis Date  . Schizophrenia   . CERVICITIS, GONOCOCCAL, History of 01/09/2007    Qualifier: History of  By: McDiarmid MD, Sherren Mocha    . ATTENTION DEFICIT, W/O HYPERACTIVITY, History of 06/30/2006    Qualifier: History of  By: McDiarmid MD, Sherren Mocha    . Depression   . 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 Henrene Dodge (Psych) At [facility] in  [place], [place]. Qualifier: Hospitalized for  By: McDiarmid MD, Sherren Mocha    . 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 TOBACCO USER 02/08/2009    Qualifier: Diagnosis of  By: Samara Snide    . 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 ASC-cannot exclude HGSIL on Pap 02/15/2012    ASC-US on 02/03/2012 pap (associated Trichomonas infection). No reflex HPV testing performed on specimen.  Patient informed that she will need repeat Pap in one year.      . Diabetes mellitus     diet controlled  . Eczema   . Abnormal Pap smear    Plan:  Recommend psychiatric Inpatient admission when medically cleared.  Subjective:   Molly [name] is a 29 y.o. female patient admitted with Psychosis IVC by the Police.  HPI:  Patient was irritable on assessment but became very agitated later, requiring PRN medications. Continues to remain agitated. Broke the room door. After we talked to her she became beligerant and started screaming. Demanding and hostile. Had to be given prn medications. meds are also being adjusted. Will increase depakote dose and add thorazine. Remains unpredictable and took majority of staff and security to handle her recent incident. Will try to streamline admission to  [facility].   HPI Elements:   Location:  Psychosis . Quality:  Agitated, aggressive, medication non compliance, treatment non compliance with treatment and medications.. Severity:  severe. Timing:  ongoing, recently discharged from the hospital. Duration:  Chronic mental illness. Context:  IVC and brought in by the Police for Psychosis.  Past Psychiatric History: Past Medical History  Diagnosis Date  . Schizophrenia   . CERVICITIS, GONOCOCCAL, History of 01/09/2007    Qualifier: History of  By: McDiarmid MD, Sherren Mocha    . ATTENTION DEFICIT, W/O HYPERACTIVITY, History of 06/30/2006    Qualifier: History of  By: McDiarmid MD, Sherren Mocha    . Depression   . 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 Henrene Dodge (Psych) At [facility] in  [place], [place]. Qualifier: Hospitalized for  By: McDiarmid MD, Sherren Mocha    . 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 TOBACCO USER 02/08/2009    Qualifier: Diagnosis of  By: Samara Snide    . 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 ASC-cannot exclude HGSIL on Pap 02/15/2012    ASC-US on 02/03/2012 pap (associated Trichomonas infection). No reflex HPV testing performed on specimen.  Patient informed that she will need repeat Pap in one year.      Marland Kitchen  Diabetes mellitus     diet controlled  . Eczema   . Abnormal Pap smear     reports that she has been smoking Cigarettes.  She has a 5 pack-year smoking history. She has never used smokeless tobacco. She reports that she drinks alcohol. She reports that she does not use illicit drugs. Family History  Problem Relation Age of Onset  . Adopted: Yes  . Bipolar disorder Sister   . Alcohol abuse Brother   . Cancer Father   . Diabetes Mother    Family History Substance Abuse: Yes, Describe: (Pt reports alcohol and/or SA in past; Says she quit yrs ago) Family Supports: Yes,  List: (Says she has family in area, and her girlfriend) Living Arrangements: Other (Comment) (Homeless) Can pt return to current living arrangement?: Yes   Allergies:   Allergies  Allergen Reactions  . Abilify [Aripiprazole] Other (See Comments)    Thinks its nasty- does not want it    ACT Assessment Complete:  Yes:    Educational Status    Risk to Self: Risk to self with the past 6 months Suicidal Ideation: No Suicidal Intent: No Is patient at risk for suicide?: Yes (Possibly; Pt was guarded and has hx of SI/HI) Suicidal Plan?: No Access to Means: No What has been your use of drugs/alcohol within the last 12 months?: Pt denies any Previous Attempts/Gestures: Yes How many times?: 1 (Unknown but at least 1x via OD) Other Self Harm Risks: Homeless, Delusional Triggers for Past Attempts: Hallucinations Intentional Self Injurious Behavior: None Family Suicide History: Unknown Recent stressful life event(s): Other (Comment) (Pt says being out of her meds is a major stressor) Persecutory voices/beliefs?:  (Unknown) Depression: Yes Depression Symptoms: Tearfulness, Feeling angry/irritable Substance abuse history and/or treatment for substance abuse?: No Suicide prevention information given to non-admitted patients: Not applicable  Risk to Others: Risk to Others within the past 6 months Homicidal Ideation: Yes-Currently Present (Pt would not respond but per IVC papers, pt is homicidal ) Thoughts of Harm to Others: Yes-Currently Present Comment - Thoughts of Harm to Others: Per IVC papers, thoughts to kill others when God tells her to Current Homicidal Intent:  (Pt did not respond; Unknown) Current Homicidal Plan: Yes-Currently Present Describe Current Homicidal Plan: To kill others "when God tells me to" Access to Homicidal Means:  (Unknown) Identified Victim: Unknown History of harm to others?: Yes Assessment of Violence: In past 6-12 months Violent Behavior Description: Pt says  she has "temper tantrums" but would not elaborate on behaviors Does patient have access to weapons?: No Criminal Charges Pending?:  (Unknown) Does patient have a court date:  (Unknown)  Abuse:    Prior Inpatient Therapy: Prior Inpatient Therapy Prior Inpatient Therapy: Yes Prior Therapy Dates: multiple times in 2015 Prior Therapy Facilty/Provider(s): [facility] Reason for Treatment: Schizophrenic Sx  Prior Outpatient Therapy: Prior Outpatient Therapy Prior Outpatient Therapy:  Lesia Sago) Prior Therapy Dates: na Prior Therapy Facilty/Provider(s): na Reason for Treatment: na  Additional Information: Additional Information 1:1 In Past 12 Months?:  (Unknown) CIRT Risk: No Elopement Risk: No Does patient have medical clearance?: No    Objective: Blood pressure 117/78, pulse 112, temperature 98.4 F (36.9 C), temperature source Oral, resp. rate 18, SpO2 100 %.There is no weight on file to calculate BMI. Results for orders placed or performed during the hospital encounter of 05/02/14 (from the past 72 hour(s))  Acetaminophen level     Status: Abnormal   Collection Time: 05/02/14  6:59 PM  Result Value  Ref Range   Acetaminophen (Tylenol), Serum <10.0 (L) 10 - 30 ug/mL    Comment:        THERAPEUTIC CONCENTRATIONS VARY SIGNIFICANTLY. A RANGE OF 10-30 ug/mL MAY BE AN EFFECTIVE CONCENTRATION FOR MANY PATIENTS. HOWEVER, SOME ARE BEST TREATED AT CONCENTRATIONS OUTSIDE THIS RANGE. ACETAMINOPHEN CONCENTRATIONS >150 ug/mL AT 4 HOURS AFTER INGESTION AND >50 ug/mL AT 12 HOURS AFTER INGESTION ARE OFTEN ASSOCIATED WITH TOXIC REACTIONS.   CBC     Status: Abnormal   Collection Time: 05/02/14  6:59 PM  Result Value Ref Range   WBC 12.7 (H) 4.0 - 10.5 K/uL   RBC 4.34 3.87 - 5.11 MIL/uL   Hemoglobin 12.4 12.0 - 15.0 g/dL   HCT 37.3 36.0 - 46.0 %   MCV 85.9 78.0 - 100.0 fL   MCH 28.6 26.0 - 34.0 pg   MCHC 33.2 30.0 - 36.0 g/dL   RDW 14.8 11.5 - 15.5 %   Platelets 352 150 - 400 K/uL   Comprehensive metabolic panel     Status: None   Collection Time: 05/02/14  6:59 PM  Result Value Ref Range   Sodium 137 135 - 145 mmol/L    Comment: Please note change in reference range.   Potassium 3.7 3.5 - 5.1 mmol/L    Comment: Please note change in reference range.   Chloride 102 96 - 112 mEq/L   CO2 28 19 - 32 mmol/L   Glucose, Bld 99 70 - 99 mg/dL   BUN 7 6 - 23 mg/dL   Creatinine, Ser 0.75 0.50 - 1.10 mg/dL   Calcium 9.2 8.4 - 10.5 mg/dL   Total Protein 8.2 6.0 - 8.3 g/dL   Albumin 4.2 3.5 - 5.2 g/dL   AST 18 0 - 37 U/L   ALT 13 0 - 35 U/L   Alkaline Phosphatase 81 39 - 117 U/L   Total Bilirubin 0.4 0.3 - 1.2 mg/dL   GFR calc non Af Amer >90 >90 mL/min   GFR calc Af Amer >90 >90 mL/min    Comment: (NOTE) The eGFR has been calculated using the CKD EPI equation. This calculation has not been validated in all clinical situations. eGFR's persistently <90 mL/min signify possible Chronic Kidney Disease.    Anion gap 7 5 - 15  Ethanol (ETOH)     Status: None   Collection Time: 05/02/14  6:59 PM  Result Value Ref Range   Alcohol, Ethyl (B) <5 0 - 9 mg/dL    Comment:        LOWEST DETECTABLE LIMIT FOR SERUM ALCOHOL IS 11 mg/dL FOR MEDICAL PURPOSES ONLY   Salicylate level     Status: None   Collection Time: 05/02/14  6:59 PM  Result Value Ref Range   Salicylate Lvl <7.9 2.8 - 20.0 mg/dL  Urine Drug Screen     Status: None   Collection Time: 05/02/14  7:04 PM  Result Value Ref Range   Opiates NONE DETECTED NONE DETECTED   Cocaine NONE DETECTED NONE DETECTED   Benzodiazepines NONE DETECTED NONE DETECTED   Amphetamines NONE DETECTED NONE DETECTED   Tetrahydrocannabinol NONE DETECTED NONE DETECTED   Barbiturates NONE DETECTED NONE DETECTED    Comment:        DRUG SCREEN FOR MEDICAL PURPOSES ONLY.  IF CONFIRMATION IS NEEDED FOR ANY PURPOSE, NOTIFY LAB WITHIN 5 DAYS.        LOWEST DETECTABLE LIMITS FOR URINE DRUG SCREEN Drug Class       Cutoff  (ng/mL)  Amphetamine      1000 Barbiturate      200 Benzodiazepine   035 Tricyclics       009 Opiates          300 Cocaine          300 THC              50   Pregnancy, urine     Status: None   Collection Time: 05/03/14  4:27 AM  Result Value Ref Range   Preg Test, Ur NEGATIVE NEGATIVE    Comment:        THE SENSITIVITY OF THIS METHODOLOGY IS >20 mIU/mL.    Labs are reviewed and are pertinent for Uremarkable.  Current Facility-Administered Medications  Medication Dose Route Frequency Provider Last Rate Last Dose  . acetaminophen (TYLENOL) tablet 650 mg  650 mg Oral Q4H PRN Resa Miner Lawyer, PA-C      . alum & mag hydroxide-simeth (MAALOX/MYLANTA) 200-200-20 MG/5ML suspension 30 mL  30 mL Oral PRN Resa Miner Lawyer, PA-C      . benztropine (COGENTIN) tablet 0.5 mg  0.5 mg Oral BID Resa Miner Lawyer, PA-C   0.5 mg at 05/05/14 0915  . divalproex (DEPAKOTE SPRINKLE) capsule 1,000 mg  1,000 mg Oral Q12H Waylan Boga, NP      . ibuprofen (ADVIL,MOTRIN) tablet 600 mg  600 mg Oral Q8H PRN Resa Miner Lawyer, PA-C      . LORazepam (ATIVAN) tablet 2 mg  2 mg Oral QID Dara Hoyer, PA-C   2 mg at 05/05/14 0914  . nicotine (NICODERM CQ - dosed in mg/24 hours) patch 21 mg  21 mg Transdermal Daily Waylan Boga, NP   21 mg at 05/05/14 1040  . nicotine polacrilex (NICORETTE) gum 2 mg  2 mg Oral PRN Waylan Boga, NP      . ondansetron [facility]) tablet 4 mg  4 mg Oral Q8H PRN Resa Miner Lawyer, PA-C      . QUEtiapine (SEROQUEL) tablet 100 mg  100 mg Oral BID Waylan Boga, NP   100 mg at 05/05/14 0920  . traZODone (DESYREL) tablet 100 mg  100 mg Oral QHS Resa Miner Lawyer, PA-C   100 mg at 05/03/14 2120  . zolpidem (AMBIEN) tablet 5 mg  5 mg Oral QHS PRN Brent [facility], PA-C       Current Outpatient Prescriptions  Medication Sig Dispense Refill  . benztropine (COGENTIN) 0.5 MG tablet Take 1 tablet (0.5 mg total) by mouth 2 (two) times daily. 60 tablet 0  . [START ON  05/16/2014] benztropine mesylate (COGENTIN) 1 MG/ML injection Inject 0.5 mLs (0.5 mg total) into the muscle every 30 (thirty) days. First dose received on 04/16/15 with next dose due in thirty days. 2 mL 0  . divalproex (DEPAKOTE ER) 250 MG 24 hr tablet Take 3 tablets (750 mg total) by mouth 2 (two) times daily. 180 tablet 0  . traZODone (DESYREL) 100 MG tablet Take 1 tablet (100 mg total) by mouth at bedtime. 30 tablet 0  . haloperidol (HALDOL) 10 MG tablet Take 1 tablet (10 mg total) by mouth 2 (two) times daily. 60 tablet 0  . [START ON 05/16/2014] haloperidol decanoate (HALDOL DECANOATE) 100 MG/ML injection Inject 1 mL (100 mg total) into the muscle every 30 (thirty) days. (Patient not taking: Reported on 05/02/2014) 1 mL 0    Psychiatric Specialty Exam:     Blood pressure 117/78, pulse 112, temperature 98.4 F (36.9 C), temperature source  Oral, resp. rate 18, SpO2 100 %.There is no weight on file to calculate BMI.  General Appearance: Casual and Disheveled  Eye Contact::  None  Speech:  Pressured  Volume:  Increased  Mood:  Angry, Anxious, Euphoric and Irritable  Affect:  Inappropriate, Labile and Full Range  Thought Process:  Circumstantial, Disorganized and Tangential  Orientation:  Full (Time, Place, and Person)  Thought Content:  Paranoid Ideation  Suicidal Thoughts:  No  Homicidal Thoughts:  No  Memory:  Immediate;   Fair Recent;   Poor Remote;   Poor  Judgement:  Impaired  Insight:  Lacking  Psychomotor Activity:  Increased and Restlessness  Concentration:  Poor  Recall:  NA  Fund of Knowledge:Poor  Language: Poor  Akathisia:  NA  Handed:  Right  AIMS (if indicated):     Assets:  Desire for Improvement Financial Resources/Insurance Housing  Sleep:      Musculoskeletal: Strength & Muscle Tone: within normal limits Gait & Station: normal Patient leans: N/A  Treatment Plan Summary: Daily contact with patient to assess and evaluate symptoms and progress in  treatment Medication management; admit to inpatient hospitalization. Streamline admission to [facility] for her current uncontrolled agitation and risk to harm.   Molly [name]    05/05/2014 11:01 AM

## 2014-05-06 DIAGNOSIS — R45850 Homicidal ideations: Secondary | ICD-10-CM

## 2014-05-06 DIAGNOSIS — F29 Unspecified psychosis not due to a substance or known physiological condition: Secondary | ICD-10-CM | POA: Diagnosis not present

## 2014-05-06 MED ORDER — LORAZEPAM 1 MG PO TABS
2.0000 mg | ORAL_TABLET | Freq: Three times a day (TID) | ORAL | Status: DC
  Administered 2014-05-06 – 2014-05-07 (×3): 2 mg via ORAL
  Filled 2014-05-06 (×3): qty 2

## 2014-05-06 MED ORDER — LORAZEPAM 2 MG/ML IJ SOLN
2.0000 mg | Freq: Once | INTRAMUSCULAR | Status: AC
  Administered 2014-05-06: 2 mg via INTRAMUSCULAR
  Filled 2014-05-06: qty 1

## 2014-05-06 MED ORDER — ZIPRASIDONE MESYLATE 20 MG IM SOLR
20.0000 mg | Freq: Once | INTRAMUSCULAR | Status: AC
  Administered 2014-05-06: 20 mg via INTRAMUSCULAR
  Filled 2014-05-06: qty 20

## 2014-05-06 MED ORDER — CHLORPROMAZINE HCL 25 MG/ML IJ SOLN
100.0000 mg | Freq: Once | INTRAMUSCULAR | Status: AC
  Administered 2014-05-06: 100 mg via INTRAMUSCULAR
  Filled 2014-05-06: qty 4

## 2014-05-06 MED ORDER — DIPHENHYDRAMINE HCL 50 MG/ML IJ SOLN
50.0000 mg | Freq: Once | INTRAMUSCULAR | Status: AC
  Administered 2014-05-06: 50 mg via INTRAMUSCULAR
  Filled 2014-05-06: qty 1

## 2014-05-06 NOTE — ED Notes (Signed)
Patient continues to walk hallway with a homemade see thru bra on. Patient refuses to put on top. Patient is uncooperative and unable to redirect patient at this Patient continues to watch patient in room 40. Staff continues to monitor patient closely. Safety maintain at this time.
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
## 2014-05-06 NOTE — ED Notes (Signed)
Injection given to patient with assistance from [employer].  Encouragement and support provided and safety maintain.

## 2014-05-06 NOTE — Progress Notes (Signed)
JenifferGates , patient care coordinator contact information [phone]  Byrd Hesselbach [phone]  ED CSW 05/06/2014 1507pm
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
## 2014-05-06 NOTE — BH Assessment (Signed)
[facility] Assessment Progress Note  At 09:43 I spoke to [place] at [facility].  Pt remains on their wait list as a priority patient.  Doylene Canning, MA Triage Specialist 05/06/2014 @ 09:44

## 2014-05-06 NOTE — ED Notes (Signed)
Patient goes into room 6 where female patient is. Patient refuses to leave room 36. Patient had to be escorted to back to her room by [employer] and security. Patient starts cursing and yelling loudly in room. Donell Sievert, PA contacted and informed of patient behavior. New telephone orders received and verified and read back.
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
## 2014-05-07 DIAGNOSIS — F200 Paranoid schizophrenia: Secondary | ICD-10-CM | POA: Diagnosis not present

## 2014-05-07 DIAGNOSIS — F39 Unspecified mood [affective] disorder: Secondary | ICD-10-CM | POA: Diagnosis not present

## 2014-05-07 DIAGNOSIS — R45850 Homicidal ideations: Secondary | ICD-10-CM | POA: Diagnosis not present

## 2014-05-07 DIAGNOSIS — F329 Major depressive disorder, single episode, unspecified: Secondary | ICD-10-CM | POA: Diagnosis not present

## 2014-05-07 DIAGNOSIS — Z9119 Patient's noncompliance with other medical treatment and regimen: Secondary | ICD-10-CM | POA: Diagnosis not present

## 2014-05-07 DIAGNOSIS — F062 Psychotic disorder with delusions due to known physiological condition: Secondary | ICD-10-CM | POA: Diagnosis not present

## 2014-05-07 DIAGNOSIS — Z3202 Encounter for pregnancy test, result negative: Secondary | ICD-10-CM | POA: Diagnosis not present

## 2014-05-07 DIAGNOSIS — F419 Anxiety disorder, unspecified: Secondary | ICD-10-CM | POA: Diagnosis not present

## 2014-05-07 DIAGNOSIS — E119 Type 2 diabetes mellitus without complications: Secondary | ICD-10-CM | POA: Diagnosis not present

## 2014-05-07 DIAGNOSIS — F209 Schizophrenia, unspecified: Secondary | ICD-10-CM | POA: Diagnosis not present

## 2014-05-07 DIAGNOSIS — F29 Unspecified psychosis not due to a substance or known physiological condition: Secondary | ICD-10-CM | POA: Diagnosis not present

## 2014-05-07 NOTE — ED Notes (Signed)
Patient at desk requesting cake. Patient informed that we do not have cake but she can have a snack. Patient continues to walk around in homemade bra out of mesh panties. Patient continues to refuse to put on top. Patient is hard to re-direct by female staff but responds to female directions at this time. Patient continues with one to sitter at this time. Q 15 min safety checks continues encouragement and support provided and safety maintain.

## 2014-05-07 NOTE — BH Assessment (Signed)
[facility] Assessment Progress Note   At 10:45 I faxed pt's vital signs to [facility], then called.  Per Marylene LandAngela pt has been accepted to their facility.  Pt's nurse, Minerva AreolaEric, has been informed.  He agrees to call transportation to facilitate transfer, and to call report to [phone].  Doylene Canninghomas Tari Lecount, MA Triage Specialist 05/07/2014 @ 10:52

## 2014-05-07 NOTE — ED Notes (Signed)
After shower patient put on scrub top and bottom and returned to her and laid down on mattress. One to One sitter continues.

## 2014-05-07 NOTE — ED Notes (Signed)
Patient urinated on herself. Patient encourage to take a shower. Patient refuses several times but after encouragement and support from staff patient agrees to take shower. One to One monitor continues and safety maintain.

## 2014-05-07 NOTE — Progress Notes (Signed)
[facility]. CSW informed [facility] ED CSW, Baxter HireKristen who reported she would follow-up with Junious Dresseronnie.  Chad CordialLauren Carter, LCSWA 05/07/2014 8:36 AM

## 2014-05-07 NOTE — BH Assessment (Signed)
Sp[facility] with [facility]eronnie [facility] vit[facility] and MAR. This Clinical research associatewriter [facility]ed documents [facility] [phone] for review.   Glorious PeachNajah Ravyn Nikkel, MS, LCASA Assessment Counselor

## 2014-05-07 NOTE — Consult Note (Signed)
[facility] Face-to-Face Psychiatry discharge note   Molly [name] is an 29 y.o. female. Total Time spent with patient: 30 minutes  Assessment: DSM5 Psychosis   Past Medical History  Diagnosis Date  . Schizophrenia   . CERVICITIS, GONOCOCCAL, History of 01/09/2007    Qualifier: History of  By: McDiarmid MD, Tawanna Cooler    . ATTENTION DEFICIT, W/O HYPERACTIVITY, History of 06/30/2006    Qualifier: History of  By: McDiarmid MD, Tawanna Cooler    . Depression   . 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 Dennie Bible (Psych) At [facility] in  [place], [place]. Qualifier: Hospitalized for  By: McDiarmid MD, Tawanna Cooler    . 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 TOBACCO USER 02/08/2009    Qualifier: Diagnosis of  By: Knox Royalty    . 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 ASC-cannot exclude HGSIL on Pap 02/15/2012    ASC-US on 02/03/2012 pap (associated Trichomonas infection). No reflex HPV testing performed on specimen.  Patient informed that she will need repeat Pap in one year.      . Diabetes mellitus     diet controlled  . Eczema   . Abnormal Pap smear    Plan:  Recommend psychiatric Inpatient admission when medically cleared. Transferring to [facility]  Subjective:   Molly [name] is a 29 y.o. female patient admitted with Psychosis IVC by the Police.  HPI:  Patient remains volatile and a threat to the staff and patients on the unit.  The patient has been sleeping this am but prior to going to sleep, she ribbed her scrubs up and threw them around the room.  Her room has had everything but the mattress removed due to her throwing and breaking everything in it.  She removed the door from the hinges earlier in her admission.  Close monitoring continues as she awaits [facility] transfer.  Chart is reviewed with Updates.  Patient have been accepted at [facility] inpatient  Psychiatric hospital for treatment of her severe Psychosis.  Patient have been sleeping since this morning with no agitation noted.  We will transfer patient as soon as transportation is available.  HPI Elements:   Location:  Psychosis . Quality:  Agitated, aggressive, medication non compliance, treatment non compliance with treatment and medications.. Severity:  severe. Timing:  ongoing, recently discharged from the hospital. Duration:  Chronic mental illness. Context:  IVC and brought in by the Police for Psychosis.  Past Psychiatric History: Past Medical History  Diagnosis Date  . Schizophrenia   . CERVICITIS, GONOCOCCAL, History of 01/09/2007    Qualifier: History of  By: McDiarmid MD, Tawanna Cooler    . ATTENTION DEFICIT, W/O HYPERACTIVITY, History of 06/30/2006    Qualifier: History of  By: McDiarmid MD, Tawanna Cooler    . Depression   . 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 Dennie Bible (Psych) At [facility] in  [place], [place]. Qualifier: Hospitalized for  By: McDiarmid MD, Tawanna Cooler    . 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 TOBACCO USER 02/08/2009    Qualifier: Diagnosis of  By: Knox Royalty    . 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 ASC-cannot exclude HGSIL on Pap 02/15/2012    ASC-US on 02/03/2012 pap (associated Trichomonas infection). No reflex HPV testing performed on specimen.  Patient informed that she will need repeat Pap in one year.      . Diabetes mellitus     diet controlled  . Eczema   . Abnormal Pap smear     reports that she has been smoking Cigarettes.  She has a 5 pack-year smoking history. She has never used smokeless tobacco. She reports that she drinks alcohol. She reports that she does not use illicit drugs. Family History  Problem Relation Age of Onset  . Adopted: Yes  . Bipolar disorder Sister   . Alcohol  abuse Brother   . Cancer Father   . Diabetes Mother    Family History Substance Abuse: Yes, Describe: (Pt reports alcohol and/or SA in past; Says she quit yrs ago) Family Supports: Yes, List: (Says she has family in area, and her girlfriend) Living Arrangements: Other (Comment) (Homeless) Can pt return to current living arrangement?: Yes   Allergies:   Allergies  Allergen Reactions  . Abilify [Aripiprazole] Other (See Comments)    Thinks its nasty- does not want it    ACT Assessment Complete:  Yes:    Educational Status    Risk to Self: Risk to self with the past 6 months Suicidal Ideation: No Suicidal Intent: No Is patient at risk for suicide?: Yes (Possibly; Pt was guarded and has hx of SI/HI) Suicidal Plan?: No Access to Means: No What has been your use of drugs/alcohol within the last 12 months?: Pt denies any Previous Attempts/Gestures: Yes How many times?: 1 (Unknown but at least 1x via OD) Other Self Harm Risks: Homeless, Delusional Triggers for Past Attempts: Hallucinations Intentional Self Injurious Behavior: None Family Suicide History: Unknown Recent stressful life event(s): Other (Comment) (Pt says being out of her meds is a major stressor) Persecutory voices/beliefs?:  (Unknown) Depression: Yes Depression Symptoms: Tearfulness, Feeling angry/irritable Substance abuse history and/or treatment for substance abuse?: No Suicide prevention information given to non-admitted patients: Not applicable  Risk to Others: Risk to Others within the past 6 months Homicidal Ideation: Yes-Currently Present (Pt would not respond but per IVC papers, pt is homicidal ) Thoughts of Harm to Others: Yes-Currently Present Comment - Thoughts of Harm to Others: Per IVC papers, thoughts to kill others when God tells her to Current Homicidal Intent:  (Pt did not respond; Unknown) Current Homicidal Plan: Yes-Currently Present Describe Current Homicidal Plan: To kill others "when God tells  me to" Access to Homicidal Means:  (Unknown) Identified Victim: Unknown History of harm to others?: Yes Assessment of Violence: In past 6-12 months Violent Behavior Description: Pt says she has "temper tantrums" but would not elaborate on behaviors Does patient have access to weapons?: No Criminal Charges Pending?:  (Unknown) Does patient have a court date:  (Unknown)  Abuse:    Prior Inpatient Therapy: Prior Inpatient Therapy Prior Inpatient Therapy: Yes Prior Therapy Dates: multiple times in 2015 Prior Therapy Facilty/Provider(s): [facility] Reason for Treatment: Schizophrenic Sx  Prior Outpatient Therapy: Prior Outpatient Therapy Prior Outpatient Therapy:  Marcos Eke) Prior Therapy Dates: na Prior Therapy Facilty/Provider(s): na Reason for Treatment: na  Additional Information: Additional Information 1:1 In Past 12 Months?:  (Unknown) CIRT Risk: No Elopement Risk: No Does patient have medical clearance?: No    Objective: Blood pressure 101/52, pulse 92, temperature 97.4 F (36.3 C), temperature source Oral, resp. rate 18, SpO2 99 %.  There is no weight on file to calculate BMI. No results found for this or any previous visit (from the past 72 hour(s)). Labs are reviewed and are pertinent for Uremarkable.  Current Facility-Administered Medications  Medication Dose Route Frequency Provider Last Rate Last Dose  . acetaminophen (TYLENOL) tablet 650 mg  650 mg Oral Q4H PRN Jamesetta Orleanshristopher W Lawyer, PA-C      . alum & mag hydroxide-simeth (MAALOX/MYLANTA) 200-200-20 MG/5ML suspension 30 mL  30 mL Oral PRN Jamesetta Orleanshristopher W Lawyer, PA-C      . benztropine (COGENTIN) tablet 0.5 mg  0.5 mg Oral BID Jamesetta Orleanshristopher W Lawyer, PA-C   0.5 mg at 05/06/14 2008  . chlorproMAZINE (THORAZINE) injection 100 mg  100 mg Intramuscular Once Nanine MeansJamison Lord, NP   100 mg at 05/05/14 1530  . diphenhydrAMINE (BENADRYL) injection 50 mg  50 mg Intramuscular Once Nanine MeansJamison Lord, NP   50 mg at 05/05/14 1430  . divalproex (DEPAKOTE  SPRINKLE) capsule 1,000 mg  1,000 mg Oral Q12H Nanine MeansJamison Lord, NP   1,000 mg at 05/06/14 2007  . ibuprofen (ADVIL,MOTRIN) tablet 600 mg  600 mg Oral Q8H PRN Jamesetta Orleanshristopher W Lawyer, PA-C      . LORazepam (ATIVAN) tablet 2 mg  2 mg Oral TID Mackinsey Pelland   2 mg at 05/07/14 0600  . nicotine (NICODERM CQ - dosed in mg/24 hours) patch 21 mg  21 mg Transdermal Daily Nanine MeansJamison Lord, NP   21 mg at 05/06/14 1348  . ondansetron (ZOFRAN) tablet 4 mg  4 mg Oral Q8H PRN Jamesetta Orleanshristopher W Lawyer, PA-C      . QUEtiapine (SEROQUEL) tablet 100 mg  100 mg Oral BID Nanine MeansJamison Lord, NP   100 mg at 05/06/14 2008  . traZODone (DESYREL) tablet 100 mg  100 mg Oral QHS Jamesetta Orleanshristopher W Lawyer, PA-C   100 mg at 05/06/14 2210   Current Outpatient Prescriptions  Medication Sig Dispense Refill  . benztropine (COGENTIN) 0.5 MG tablet Take 1 tablet (0.5 mg total) by mouth 2 (two) times daily. 60 tablet 0  . [START ON 05/16/2014] benztropine mesylate (COGENTIN) 1 MG/ML injection Inject 0.5 mLs (0.5 mg total) into the muscle every 30 (thirty) days. First dose received on 04/16/15 with next dose due in thirty days. 2 mL 0  . divalproex (DEPAKOTE ER) 250 MG 24 hr tablet Take 3 tablets (750 mg total) by mouth 2 (two) times daily. 180 tablet 0  . traZODone (DESYREL) 100 MG tablet Take 1 tablet (100 mg total) by mouth at bedtime. 30 tablet 0  . haloperidol (HALDOL) 10 MG tablet Take 1 tablet (10 mg total) by mouth 2 (two) times daily. 60 tablet 0  . [START ON 05/16/2014] haloperidol decanoate (HALDOL DECANOATE) 100 MG/ML injection Inject 1 mL (100 mg total) into the muscle every 30 (thirty) days. (Patient not taking: Reported on 05/02/2014) 1 mL 0    Psychiatric Specialty Exam:     Blood pressure 101/52, pulse 92, temperature 97.4 F (36.3 C), temperature source Oral, resp. rate 18, SpO2 99 %.There is no weight on file to calculate BMI.  General Appearance: Casual and Disheveled  Eye Contact::  None  Speech:  Pressured  Volume:  Increased   Mood:  Angry, Anxious, Euphoric and Irritable  Affect:  Inappropriate, Labile and Full Range  Thought Process:  Circumstantial, Disorganized and Tangential  Orientation:  Full (Time, Place, and Person)  Thought Content:  Paranoid Ideation  Suicidal Thoughts:  No  Homicidal Thoughts:  Yes, no intent until angered--which occurs  frequently  Memory:  Immediate;   Fair Recent;   Poor Remote;   Poor  Judgement:  Impaired  Insight:  Lacking  Psychomotor Activity:  Increased and Restlessness  Concentration:  Poor  Recall:  NA  Fund of Knowledge:Poor  Language: Poor  Akathisia:  NA  Handed:  Right  AIMS (if indicated):     Assets:  Desire for Improvement Financial Resources/Insurance Housing  Sleep:      Musculoskeletal: Strength & Muscle Tone: within normal limits Gait & Station: normal Patient leans: N/A  Treatment Plan Summary: Daily contact with patient to assess and evaluate symptoms and progress in treatment Medication management.  Patient have been accepted at [facility] and will be transported by the [facility] as soon as they are here.   Earney Navy, PMH-NP 05/07/2014 11:22 AM    Patient seen, evaluated and I agree with notes by Nurse Practitioner. Thedore Mins, MD

## 2014-06-27 ENCOUNTER — Emergency Department (HOSPITAL_COMMUNITY)
Admission: EM | Admit: 2014-06-27 | Discharge: 2014-06-27 | Disposition: A | Discharge status: Home / Self Care | Payer: Medicare Other | Attending: Emergency Medicine | Admitting: Emergency Medicine

## 2014-06-27 ENCOUNTER — Encounter (HOSPITAL_COMMUNITY): Payer: Self-pay | Admitting: Emergency Medicine

## 2014-06-27 DIAGNOSIS — E119 Type 2 diabetes mellitus without complications: Secondary | ICD-10-CM | POA: Insufficient documentation

## 2014-06-27 DIAGNOSIS — F29 Unspecified psychosis not due to a substance or known physiological condition: Secondary | ICD-10-CM | POA: Insufficient documentation

## 2014-06-27 DIAGNOSIS — F329 Major depressive disorder, single episode, unspecified: Secondary | ICD-10-CM | POA: Diagnosis not present

## 2014-06-27 DIAGNOSIS — Z3202 Encounter for pregnancy test, result negative: Secondary | ICD-10-CM | POA: Diagnosis not present

## 2014-06-27 DIAGNOSIS — R000 Tachycardia, unspecified: Secondary | ICD-10-CM | POA: Insufficient documentation

## 2014-06-27 DIAGNOSIS — Z720 Tobacco use: Secondary | ICD-10-CM | POA: Insufficient documentation

## 2014-06-27 DIAGNOSIS — Z79899 Other long term (current) drug therapy: Secondary | ICD-10-CM | POA: Diagnosis not present

## 2014-06-27 DIAGNOSIS — Z046 Encounter for general psychiatric examination, requested by authority: Secondary | ICD-10-CM | POA: Diagnosis present

## 2014-06-27 DIAGNOSIS — Z8619 Personal history of other infectious and parasitic diseases: Secondary | ICD-10-CM | POA: Insufficient documentation

## 2014-06-27 DIAGNOSIS — Z872 Personal history of diseases of the skin and subcutaneous tissue: Secondary | ICD-10-CM | POA: Diagnosis not present

## 2014-06-27 LAB — ETHANOL

## 2014-06-27 LAB — CBC WITH DIFFERENTIAL/PLATELET
BASOS ABS: 0 10*3/uL (ref 0.0–0.1)
Basophils Relative: 0 % (ref 0–1)
EOS ABS: 0.1 10*3/uL (ref 0.0–0.7)
Eosinophils Relative: 1 % (ref 0–5)
HEMATOCRIT: 39.2 % (ref 36.0–46.0)
Hemoglobin: 12.8 g/dL (ref 12.0–15.0)
LYMPHS PCT: 28 % (ref 12–46)
Lymphs Abs: 2.7 10*3/uL (ref 0.7–4.0)
MCH: 28.6 pg (ref 26.0–34.0)
MCHC: 32.7 g/dL (ref 30.0–36.0)
MCV: 87.5 fL (ref 78.0–100.0)
MONO ABS: 1 10*3/uL (ref 0.1–1.0)
MONOS PCT: 10 % (ref 3–12)
NEUTROS PCT: 61 % (ref 43–77)
Neutro Abs: 5.9 10*3/uL (ref 1.7–7.7)
PLATELETS: 298 10*3/uL (ref 150–400)
RBC: 4.48 MIL/uL (ref 3.87–5.11)
RDW: 16.2 % — AB (ref 11.5–15.5)
WBC: 9.7 10*3/uL (ref 4.0–10.5)

## 2014-06-27 LAB — COMPREHENSIVE METABOLIC PANEL
ALT: 16 U/L (ref 0–35)
ANION GAP: 8 (ref 5–15)
AST: 25 U/L (ref 0–37)
Albumin: 3.9 g/dL (ref 3.5–5.2)
Alkaline Phosphatase: 71 U/L (ref 39–117)
BUN: 11 mg/dL (ref 6–23)
CHLORIDE: 105 mmol/L (ref 96–112)
CO2: 22 mmol/L (ref 19–32)
CREATININE: 0.67 mg/dL (ref 0.50–1.10)
Calcium: 8.8 mg/dL (ref 8.4–10.5)
GFR calc Af Amer: >90 mL/min (ref >90)
GFR calc non Af Amer: >90 mL/min (ref >90)
Glucose, Bld: 106 mg/dL — ABNORMAL HIGH (ref 70–99)
Potassium: 4.3 mmol/L (ref 3.5–5.1)
Sodium: 135 mmol/L (ref 135–145)
TOTAL PROTEIN: 7.8 g/dL (ref 6.0–8.3)
Total Bilirubin: 0.5 mg/dL (ref 0.3–1.2)

## 2014-06-27 LAB — RAPID URINE DRUG SCREEN, HOSP PERFORMED
Amphetamines: NOT DETECTED
BENZODIAZEPINES: NOT DETECTED
Barbiturates: NOT DETECTED
COCAINE: NOT DETECTED
Opiates: NOT DETECTED
TETRAHYDROCANNABINOL: NOT DETECTED

## 2014-06-27 LAB — POC URINE PREG, ED: Preg Test, Ur: NEGATIVE

## 2014-06-27 MED ORDER — ZOLPIDEM TARTRATE 5 MG PO TABS: 5.0000 mg | ORAL_TABLET | Freq: Every evening | ORAL | Status: DC | PRN

## 2014-06-27 MED ORDER — IBUPROFEN 200 MG PO TABS: 600.0000 mg | ORAL_TABLET | Freq: Three times a day (TID) | ORAL | Status: DC | PRN

## 2014-06-27 MED ORDER — ONDANSETRON HCL 4 MG PO TABS: 4.0000 mg | ORAL_TABLET | Freq: Three times a day (TID) | ORAL | Status: DC | PRN

## 2014-06-27 MED ORDER — HALOPERIDOL 5 MG PO TABS: 10.0000 mg | ORAL_TABLET | Freq: Two times a day (BID) | ORAL | Status: DC

## 2014-06-27 MED ORDER — ZIPRASIDONE MESYLATE 20 MG IM SOLR
20.0000 mg | Freq: Once | INTRAMUSCULAR | Status: AC
  Administered 2014-06-27: 20 mg via INTRAMUSCULAR
  Filled 2014-06-27: qty 20

## 2014-06-27 MED ORDER — ACETAMINOPHEN 325 MG PO TABS: 650.0000 mg | ORAL_TABLET | ORAL | Status: DC | PRN

## 2014-06-27 MED ORDER — NICOTINE 21 MG/24HR TD PT24: 21.0000 mg | MEDICATED_PATCH | Freq: Every day | TRANSDERMAL | Status: DC

## 2014-06-27 MED ORDER — ALUM & MAG HYDROXIDE-SIMETH 200-200-20 MG/5ML PO SUSP: 30.0000 mL | ORAL | Status: DC | PRN

## 2014-06-27 MED ORDER — DIVALPROEX SODIUM ER 500 MG PO TB24
500.0000 mg | ORAL_TABLET | Freq: Two times a day (BID) | ORAL | Status: DC
  Filled 2014-06-27: qty 1

## 2014-06-27 MED ORDER — HALOPERIDOL DECANOATE 100 MG/ML IM SOLN: 100.0000 mg | INTRAMUSCULAR | Status: DC

## 2014-06-27 MED ORDER — TRAZODONE HCL 100 MG PO TABS: 100.0000 mg | ORAL_TABLET | Freq: Every day | ORAL | Status: DC

## 2014-06-27 NOTE — BH Assessment (Signed)
Assessment Note  Molly [name] is an 28 y.o. female uncooperative with completing a TTS assessment or cooperating with ED staff. [employer] report that they were sending her to [facility] ED bc pt wouldn't turn off her cell phone and give them her belongings. So they were wanting pt sent here for medications to calm her down and then she could go back to [place].  Patient is uncooperative here at [facility] with labs and answering questions. Writer witnessed patient yelling and sreaming obscenities to staff. Writer will provide collateral information (see below):   She was brought in by [facility] from [place] under IVC papers that state: Respondent has been previously diagnosed as bipolar and paranoid, was just released from [facility] a few days ago and is regressing quickly according to her family. Family states she is on numerous medications but is currently is currently non-compliant with her medication regimen. Respondent has expressed suicidal ideations saying she wanted to kill herself and go to heaven. She has become increasingly combative and hostile with family, damaging property and threatening physical harm. Family relates she abuses marijuana on a daily basis and has been standing out front of store on [address] begging for cigarettes and beer. Family fears for her safety.    Axis I: Schizoaffective Disorder Axis II: Deferred Axis III:  Past Medical History  Diagnosis Date  . Schizophrenia   . CERVICITIS, GONOCOCCAL, History of 01/09/2007    Qualifier: History of  By: McDiarmid MD, Tawanna Cooler    . ATTENTION DEFICIT, W/O HYPERACTIVITY, History of 06/30/2006    Qualifier: History of  By: McDiarmid MD, Tawanna Cooler    . Depression   . 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 Dennie Bible (Psych) At [facility] in  [place], [place]. Qualifier: Hospitalized for  By: McDiarmid MD, Tawanna Cooler    . 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 TOBACCO USER 02/08/2009    Qualifier: Diagnosis of  By: Knox Royalty    . 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 ASC-cannot exclude HGSIL on Pap 02/15/2012    ASC-US on 02/03/2012 pap (associated Trichomonas infection). No reflex HPV testing performed on specimen.  Patient informed that she will need repeat Pap in one year.      . Diabetes mellitus     diet controlled  . Eczema   . Abnormal Pap smear    Axis IV: other psychosocial or environmental problems, problems related to social environment, problems with access to health care services and problems with primary support group Axis V: 31-40 impairment in reality testing  Past Medical History:  Past Medical History  Diagnosis Date  . Schizophrenia   . CERVICITIS, GONOCOCCAL, History of 01/09/2007    Qualifier: History of  By: McDiarmid MD, Tawanna Cooler    . ATTENTION DEFICIT, W/O HYPERACTIVITY, History of 06/30/2006    Qualifier: History of  By: McDiarmid MD, Tawanna Cooler    . Depression   . 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 Dennie Bible (Psych) At [facility] in  [place], [place]. Qualifier: Hospitalized for  By: McDiarmid MD, Tawanna Cooler    . 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 TOBACCO USER 02/08/2009    Qualifier: Diagnosis of  By:  Knox Royalty    . 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 ASC-cannot exclude HGSIL on Pap 02/15/2012    ASC-US on 02/03/2012 pap (associated Trichomonas infection). No reflex HPV testing performed on specimen.  Patient informed that she will need repeat Pap in one year.      . Diabetes mellitus     diet controlled  . Eczema   . Abnormal Pap smear     Past Surgical History  Procedure Laterality Date  . Incision and drainage      pilanodal cyst    Family History:  Family History  Problem  Relation Age of Onset  . Adopted: Yes  . Bipolar disorder Sister   . Alcohol abuse Brother   . Cancer Father   . Diabetes Mother     Social History:  reports that she has been smoking Cigarettes.  She has a 5 pack-year smoking history. She has never used smokeless tobacco. She reports that she drinks alcohol. She reports that she does not use illicit drugs.  Additional Social History:  Alcohol / Drug Use Pain Medications: SEE MAR Prescriptions: SEE MAR Over the Counter: SEE MAR History of alcohol / drug use?: No history of alcohol / drug abuse (UDS and BAL negative)  CIWA: CIWA-Ar BP: 128/89 mmHg Pulse Rate: 96 COWS:    Allergies:  Allergies  Allergen Reactions  . Abilify [Aripiprazole] Other (See Comments)    Thinks its nasty- does not want it    Home Medications:  (Not in a hospital admission)  OB/GYN Status:  No LMP recorded. Patient is not currently having periods (Reason: Other).  General Assessment Data Location of Assessment: [facility] ED Is this a Tele or Face-to-Face Assessment?: Face-to-Face Is this an Initial Assessment or a Re-assessment for this encounter?: Initial Assessment Living Arrangements: Other (Comment) (homeless ) Can pt return to current living arrangement?: No Admission Status: Voluntary Is patient capable of signing voluntary admission?: Yes Transfer from: Acute Hospital Referral Source: Self/Family/Friend     [facility] Crisis Care Plan Living Arrangements: Other (Comment) (homeless ) Name of Psychiatrist: Unknown Name of Therapist: Unknown  Education Status Is patient currently in school?: No  Risk to self with the past 6 months Suicidal Ideation:  (unable to confirm or deny; per ED notes pt reported SI ) Suicidal Intent: No (unk; pt guarded but has hx of SI and HI) Is patient at risk for suicide?: Yes (unk) Suicidal Plan?:  (unk; no plan idicated ) Access to Means:  (unk; no) What has been your use of drugs/alcohol within the last 12 months?:   (pt denies ) Previous Attempts/Gestures: Yes (1 prior OD) How many times?:  (1x-Overdose) Other Self Harm Risks:  (none reported ) Triggers for Past Attempts: Hallucinations Intentional Self Injurious Behavior: None Family Suicide History: Unknown Recent stressful life event(s): Other (Comment) ("being out of meds") Persecutory voices/beliefs?: No Depression: Yes Depression Symptoms: Feeling angry/irritable, Feeling worthless/self pity, Loss of interest in usual pleasures, Guilt, Fatigue, Isolating, Tearfulness, Insomnia, Despondent Substance abuse history and/or treatment for substance abuse?: No Suicide prevention information given to non-admitted patients: Not applicable  Risk to Others within the past 6 months Homicidal Ideation: No (patien does not respond but combative with family ) Thoughts of Harm to Others: No Comment - Thoughts of Harm to Others:  (n/a) Current Homicidal Intent: No Current Homicidal Plan: No Describe Current Homicidal Plan:  (n/a) Access to Homicidal Means: No Identified  Victim:  (n/a) History of harm to others?: Yes Assessment of Violence:  (Patient has a history of violent and agressive behaviors) Violent Behavior Description:  (patient is calm and cooperative ) Does patient have access to weapons?: No Criminal Charges Pending?: No Does patient have a court date: No  Psychosis Hallucinations: None noted Delusions: None noted  Mental Status Report Appear/Hygiene: In scrubs Eye Contact: Good Motor Activity: Freedom of movement Speech: Soft, Slow Level of Consciousness: Quiet/awake Mood: Depressed Affect: Labile Anxiety Level: None Thought Processes: Coherent, Relevant Judgement: Unimpaired Orientation: Person, Place Obsessive Compulsive Thoughts/Behaviors: None  Cognitive Functioning Concentration: Decreased Memory: Recent Impaired, Remote Impaired IQ: Average Insight: Poor Impulse Control: Poor Appetite:  (unk) Weight Loss:   (unk) Weight Gain:  (unk) Sleep: Unable to Assess Total Hours of Sleep:  (unk) Vegetative Symptoms: None  ADLScreening [facility] Assessment Services) Patient's cognitive ability adequate to safely complete daily activities?: Yes Patient able to express need for assistance with ADLs?: Yes Independently performs ADLs?: Yes (appropriate for developmental age)  Prior Inpatient Therapy Prior Inpatient Therapy: Yes Prior Therapy Dates: multiple times in 2015 [facility] Reason for Treatment: Schizophrenic Sx  Prior Outpatient Therapy Prior Outpatient Therapy: No Prior Therapy Dates: na Prior Therapy Facilty/Provider(s): na Reason for Treatment: na  ADL Screening (condition at time of admission) Patient's cognitive ability adequate to safely complete daily activities?: Yes Is the patient deaf or have difficulty hearing?: No Does the patient have difficulty seeing, even when wearing glasses/contacts?: No Does the patient have difficulty concentrating, remembering, or making decisions?: Yes Patient able to express need for assistance with ADLs?: Yes Does the patient have difficulty dressing or bathing?: No Independently performs ADLs?: Yes (appropriate for developmental age) Does the patient have difficulty walking or climbing stairs?: No Weakness of Legs: None Weakness of Arms/Hands: None  Home Assistive Devices/Equipment Home Assistive Devices/Equipment: None    Abuse/Neglect Assessment (Assessment to be complete while patient is alone) Physical Abuse:  (unk) Verbal Abuse:  (unk) Sexual Abuse:  (unk) Exploitation of patient/patient's resources:  (unk) Self-Neglect: Denies Values / Beliefs Cultural Requests During Hospitalization: None Spiritual Requests During Hospitalization: None   Advance Directives (For Healthcare) Does patient have an advance directive?: Yes, No Would patient like information on creating an advanced directive?: No - patient  declined information Does patient want to make changes to advanced directive?: No - Patient declined Copy of advanced directive(s) in chart?: No - copy requested    Additional Information 1:1 In Past 12 Months?: No CIRT Risk: No Elopement Risk: No Does patient have medical clearance?: No     Disposition:  Disposition Initial Assessment Completed for this Encounter: Yes Disposition of Patient: Other dispositions (Patient to evaluated in the am by psychiatry, per Julieanne CottonJosephine)  On Site Evaluation by:   Reviewed with Physician:    Melynda Rippleerry, Alexandra Posadas [facility] 06/27/2014 6:51 PM

## 2014-06-27 NOTE — Discharge Instructions (Signed)
Psychosis Psychosis refers to a severe lack of understanding with reality. During a psychotic episode, you are not able to think clearly. During a psychotic episode, your responses and emotions are inappropriate and do not coincide with what is actually happening. You often have false beliefs about what is happening or who you are (delusions), and you may see, hear, taste, smell, or feel things that are not present (hallucinations). Psychosis is usually a severe symptom of a very serious mental health (psychiatric) condition, but it can sometimes be the result of a medical condition. CAUSES   Psychiatric conditions, such as:  Schizophrenia.  Bipolar disorder.  Depression.  Personality disorders.  Alcohol or drug abuse.  Medical conditions, such as:  Brain injury.  Brain tumor.  Dementia.  Brain diseases, such as Alzheimer's, Parkinson's, or Huntington's disease.  Neurological diseases, such as epilepsy.  Genetic disorders.  Metabolic disorders.  Infections that affect the brain.  Certain prescription drugs.  Stroke. SYMPTOMS   Unable to think or speak clearly or respond appropriately.  Disorganized thinking (thoughts jump from one thought to another).  Severe inappropriate behavior.  Delusions may include:  A strong belief that is odd, unrealistic, or false.  Feeling extremely fearful or suspicious (paranoid).  Believing you are someone else, have high importance, or have an altered identity.  Hallucinations. DIAGNOSIS   Mental health evaluation.  Physical exam.  Blood tests.  Computerized magnetic scan (MRI) or other brain scans. TREATMENT  Your caregiver will recommend a course of treatment that depends on the cause of the psychosis. Treatment may include:  Monitoring and supportive care in the hospital.  Taking medicines (antipsychotic medicine) to reduce symptoms and balance chemicals in the brain.  Taking medicines to manage underlying  mental health conditions.  Therapy and other supportive programs outside of the hospital.  Treating an underlying medical condition. If the cause of the psychosis can be treated or corrected, the outlook is good. Without treatment, psychotic episodes can cause danger to yourself or others. Treatment may be short-term or lifelong. HOME CARE INSTRUCTIONS   Take all medicines as directed. This is important.  Use a pillbox or write down your medicine schedule to make sure you are taking them.  Check with your caregiver before using over-the-counter medicines, herbs, or supplements.  Seek individual and family support through therapy and mental health education (psychoeducation) programs. These will help you manage symptoms and side effects of medicines, learn life skills, and maintain a healthy routine.  Maintain a healthy lifestyle.  Exercise regularly.  Avoid alcohol and drugs.  Learn ways to reduce stress and cope with stress, such as yoga and meditation.  Talk about your feelings with family members or caregivers.  Make time for yourself to do things you enjoy.  Know the early warning signs of psychosis. Your caregiver will recommend steps to take when you notice symptoms such as:  Feeling anxious or preoccupied.  Having racing thoughts.  Changes in your interest in life and relationships.  Follow up with your caregivers for continued outpatient treatment as directed. SEEK MEDICAL CARE IF:   Medicines do not seem to be helping.  You hear voices telling you to do things.  You see, smell, or feel things that are not there.  You feel hopeless and overwhelmed.  You feel extremely fearful and suspicious that something will harm you.  You feel like you cannot leave your house.  You have trouble taking care of yourself.  You experience side effects of medicines, such as   changes in sleep patterns, dizziness, weight gain, restlessness, movement changes, muscle spasms, or  tremors. SEEK IMMEDIATE MEDICAL CARE IF:  Severe psychotic symptoms present a safety issue (such as an urge to hurt yourself or others). MAKE SURE YOU:   Understand these instructions.  Will watch your condition.  Will get help right away if you are not doing well or get worse. FOR MORE INFORMATION  National Institute of Mental Health: www.nimh.nih.gov Document Released: 10/07/2009 Document Revised: 07/12/2011 Document Reviewed: 10/07/2009 ExitCare Patient Information 2015 [employer]. This information is not intended to replace advice given to you by your health care provider. Make sure you discuss any questions you have with your health care provider.  

## 2014-06-27 NOTE — ED Notes (Signed)
Pt brought in by [facility] from [facility] under IVC papers that state: Respondent has been previously diagnosed as bipolar and paranoid, was just released from [facility] a few days ago and is regressing quickly according to her family.  Family states she is on numerous medications but is currently is currently non-compliant with her medication regimen.  Respondent has expressed suicidal ideations saying she wanted to kill herself and go to heaven. She has become increasingly combative and hostile with family., damaging property and threatening physical harm.  Family relates she abuses marijuana on a daily basis and has been standing out front of store on [address] begging for cigarettes and beer. Family fears for her safety.  [employer] that brougth pt in states that [facility] told them that they were sending her to [facility] ED bc pt wouldn't turn off her cell phone and given them her belongings.  So they were wanting pt sent here for medications to calm her down and then she could go back to [facility].

## 2014-06-27 NOTE — ED Notes (Signed)
[facility] received fax and states the pt may return to them

## 2014-06-27 NOTE — ED Notes (Addendum)
Pt refused blood draw.  Lead tech Chrissie NoaWilliam in talking with pt, nurse notified as well.

## 2014-06-27 NOTE — ED Provider Notes (Signed)
CSN: [id number]     Arrival date & time 06/27/14  1520 History  This chart was scribed for non-physician practitioner, Fayrene Helper, PA-C working with Gwyneth Sprout, MD, by Abel Presto, ED Scribe. This patient was seen in room WTR4/WLPT4 and the patient's care was started at 4:08 PM.      Chief Complaint  Patient presents with  . Medical Clearance    The history is provided by the patient, the police and medical records. No language interpreter was used.   HPI Comments: Molly [name] is a 29 y.o. female with PMHx of schizophrenia, ADD, DM, who presents to the Emergency Department for medical clearance. Pt here from [facility] with IVC. Pt not willing to put phone down at [facility].  According to IVC paperwork: Respondent has been previously diagnosed as bipolar and paranoid, was just released from [place] a few days ago after 2 weeks, regressing quickly according to her family. Pt is on numerous medications but is currently non compliant. Pt has expressed suicidal ideation. Pt has become increasingly combative and hostile with family, damaging property and threatening others. Family notes marijuana abuse, pt standing outside of store begging for cigarettes and beer. Family fears for pt's safety.  Pt notes recent loss of appetite. Pt urinated on self in waiting room. Pt denies sleep disturbances, suicidal and homicidal ideation, and hallucinations.   Past Medical History  Diagnosis Date  . Schizophrenia   . CERVICITIS, GONOCOCCAL, History of 01/09/2007    Qualifier: History of  By: McDiarmid MD, Tawanna Cooler    . ATTENTION DEFICIT, W/O HYPERACTIVITY, History of 06/30/2006    Qualifier: History of  By: McDiarmid MD, Tawanna Cooler    . Depression   . 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 Dennie Bible (Psych) At [facility] in  [place], [place]. Qualifier: Hospitalized for  By: McDiarmid MD, Tawanna Cooler    . 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 TOBACCO USER 02/08/2009    Qualifier: Diagnosis of  By: Knox Royalty    . 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 ASC-cannot exclude HGSIL on Pap 02/15/2012    ASC-US on 02/03/2012 pap (associated Trichomonas infection). No reflex HPV testing performed on specimen.  Patient informed that she will need repeat Pap in one year.      . Diabetes mellitus     diet controlled  . Eczema   . Abnormal Pap smear    Past Surgical History  Procedure Laterality Date  . Incision and drainage      pilanodal cyst   Family History  Problem Relation Age of Onset  . Adopted: Yes  . Bipolar disorder Sister   . Alcohol abuse Brother   . Cancer Father   . Diabetes Mother    History  Substance Use Topics  . Smoking status: Current Every Day Smoker -- 0.50 packs/day for 10 years    Types: Cigarettes  . Smokeless tobacco: Never Used  . Alcohol Use: Yes     Comment: occ   OB History    Gravida Para Term Preterm AB TAB SAB Ectopic Multiple Living   Review of Systems  Constitutional: Positive for appetite change.  Psychiatric/Behavioral: Negative for suicidal ideas, hallucinations and sleep disturbance.  All other  systems reviewed and are negative.     Allergies  Abilify  Home Medications   Prior to Admission medications   Medication Sig Start Date End Date Taking? Authorizing Provider  acetaminophen (TYLENOL) 500 MG tablet Take 500 mg by mouth every 6 (six) hours as needed for headache (headache).   Yes Historical Provider, MD  benztropine (COGENTIN) 0.5 MG tablet Take 1 tablet (0.5 mg total) by mouth 2 (two) times daily. 04/17/14  Yes Fransisca KaufmannLaura Davis, NP  benztropine mesylate (COGENTIN) 1 MG/ML injection Inject 0.5 mLs (0.5 mg total) into the muscle every 30 (thirty) days. First dose received on 04/16/15 with next dose due in thirty days. 05/16/14  Yes Fransisca KaufmannLaura Davis, NP  divalproex  (DEPAKOTE ER) 250 MG 24 hr tablet Take 3 tablets (750 mg total) by mouth 2 (two) times daily. Patient taking differently: Take 500 mg by mouth 2 (two) times daily.  04/17/14  Yes Fransisca KaufmannLaura Davis, NP  folic acid (FOLVITE) 1 MG tablet Take 1 mg by mouth daily.   Yes Historical Provider, MD  traZODone (DESYREL) 100 MG tablet Take 1 tablet (100 mg total) by mouth at bedtime. Patient taking differently: Take 100 mg by mouth daily.  04/17/14  Yes Fransisca KaufmannLaura Davis, NP  haloperidol (HALDOL) 10 MG tablet Take 1 tablet (10 mg total) by mouth 2 (two) times daily. Patient not taking: Reported on 06/27/2014 04/17/14   Fransisca KaufmannLaura Davis, NP  haloperidol decanoate (HALDOL DECANOATE) 100 MG/ML injection Inject 1 mL (100 mg total) into the muscle every 30 (thirty) days. Patient not taking: Reported on 05/02/2014 05/16/14   Fransisca KaufmannLaura Davis, NP   BP 128/89 mmHg  Pulse 96  Temp(Src) 99 F (37.2 C) (Oral)  Resp 20  SpO2 95% Physical Exam  Constitutional: She is oriented to person, place, and time. She appears well-developed and well-nourished.  HENT:  Head: Normocephalic.  Eyes: Conjunctivae are normal.  Neck: Normal range of motion. Neck supple.  Cardiovascular: Regular rhythm.  Tachycardia present.   Pulmonary/Chest: Effort normal and breath sounds normal. No respiratory distress. She has no wheezes. She has no rales.  Lungs clear to auscultation bialterally  Musculoskeletal: Normal range of motion.  Neurological: She is alert and oriented to person, place, and time.  Skin: Skin is warm and dry.  Psychiatric: She has a normal mood and affect. Her behavior is normal.  Nursing note and vitals reviewed.   ED Course  Procedures (including critical care time) DIAGNOSTIC STUDIES: Oxygen Saturation is 95% on room air, normal by my interpretation.    COORDINATION OF CARE: 4:18 PM Discussed treatment plan with patient at beside, the patient agrees with the plan and has no further questions at this time.  5:37 PM Pt here for  medical clearance.  While waiting she became irrate, verbally abusing and refusing blood work.  She is not cooperative and belligerent.  Nurse notified Dr. Fayrene FearingJames who evaluate pt and gave Geodon.  Pt will be moved to the Psych hold for TTS consultation.    6:39 PM Pt is medically cleared and can be [facility].  Dr. Fayrene FearingJames agrees with plan.    6:54 PM [facility] does not have any beds available.  Therefore, pt will be placed in Psych Hold until she has an available bed.     Labs Review Labs Reviewed  CBC WITH DIFFERENTIAL/PLATELET - Abnormal; Notable for the following:    RDW 16.2 (*)    All other components within normal limits  COMPREHENSIVE METABOLIC PANEL -  Abnormal; Notable for the following:    Glucose, Bld 106 (*)    All other components within normal limits  URINE RAPID DRUG SCREEN (HOSP PERFORMED)  ETHANOL  VALPROIC ACID LEVEL  POC URINE PREG, ED    Imaging Review No results found.   EKG Interpretation None      MDM   Final diagnoses:  Psychosis, unspecified psychosis type   BP 128/89 mmHg  Pulse 96  Temp(Src) 99 F (37.2 C) (Oral)  Resp 20  SpO2 95%  I have reviewed nursing notes and vital signs. I personally reviewed the imaging tests through PACS system  I reviewed available ER/hospitalization records thought the EMR  I personally performed the services described in this documentation, which was scribed in my presence. The recorded information has been reviewed and is accurate.      Fayrene Helper, PA-C 06/27/14 1840  Fayrene Helper, PA-C 06/27/14 1920  Gwyneth Sprout, MD 06/28/14 0010

## 2014-06-28 NOTE — ED Provider Notes (Signed)
Since seen and evaluated. I was able to speak with her and reassure her somewhat. She was able to calm although she had episodes of emotional outburst. Given Geodon IM. Does become more appropriate.  Screening labs. Ultimately she is going to be [facility].  Rolland PorterMark Avelino Herren, MD 06/28/14 [phone]

## 2014-07-10 ENCOUNTER — Emergency Department (HOSPITAL_COMMUNITY)
Admission: EM | Admit: 2014-07-10 | Discharge: 2014-07-10 | Disposition: A | Discharge status: Home / Self Care | Payer: Medicare Other | Attending: Emergency Medicine | Admitting: Emergency Medicine

## 2014-07-10 ENCOUNTER — Encounter (HOSPITAL_COMMUNITY): Payer: Self-pay | Admitting: *Deleted

## 2014-07-10 DIAGNOSIS — R1030 Lower abdominal pain, unspecified: Secondary | ICD-10-CM | POA: Diagnosis not present

## 2014-07-10 DIAGNOSIS — R110 Nausea: Secondary | ICD-10-CM | POA: Diagnosis not present

## 2014-07-10 DIAGNOSIS — F329 Major depressive disorder, single episode, unspecified: Secondary | ICD-10-CM | POA: Diagnosis not present

## 2014-07-10 DIAGNOSIS — Z3202 Encounter for pregnancy test, result negative: Secondary | ICD-10-CM | POA: Diagnosis not present

## 2014-07-10 DIAGNOSIS — Z872 Personal history of diseases of the skin and subcutaneous tissue: Secondary | ICD-10-CM | POA: Diagnosis not present

## 2014-07-10 DIAGNOSIS — F209 Schizophrenia, unspecified: Secondary | ICD-10-CM | POA: Diagnosis not present

## 2014-07-10 DIAGNOSIS — E119 Type 2 diabetes mellitus without complications: Secondary | ICD-10-CM | POA: Diagnosis not present

## 2014-07-10 DIAGNOSIS — R109 Unspecified abdominal pain: Secondary | ICD-10-CM | POA: Diagnosis not present

## 2014-07-10 DIAGNOSIS — Z8619 Personal history of other infectious and parasitic diseases: Secondary | ICD-10-CM | POA: Diagnosis not present

## 2014-07-10 DIAGNOSIS — Z79899 Other long term (current) drug therapy: Secondary | ICD-10-CM | POA: Diagnosis not present

## 2014-07-10 DIAGNOSIS — Z720 Tobacco use: Secondary | ICD-10-CM | POA: Insufficient documentation

## 2014-07-10 LAB — URINALYSIS, ROUTINE W REFLEX MICROSCOPIC
Bilirubin Urine: NEGATIVE
GLUCOSE, UA: NEGATIVE mg/dL
Hgb urine dipstick: NEGATIVE
Ketones, ur: NEGATIVE mg/dL
Leukocytes, UA: NEGATIVE
NITRITE: NEGATIVE
PH: 7 (ref 5.0–8.0)
Protein, ur: NEGATIVE mg/dL
SPECIFIC GRAVITY, URINE: 1.015 (ref 1.005–1.030)
Urobilinogen, UA: 1 mg/dL (ref 0.0–1.0)

## 2014-07-10 LAB — POC URINE PREG, ED: Preg Test, Ur: NEGATIVE

## 2014-07-10 NOTE — ED Notes (Signed)
Pt ambulating independently w/ steady gait on d/c in no acute distress, A&Ox4.D/c instructions reviewed w/ pt and family - pt and family deny any further questions or concerns at present.  

## 2014-07-10 NOTE — ED Notes (Signed)
Bed: [id number] Expected date: 07/10/14 Expected time: 3:52 AM Means of arrival: Ambulance Comments: Schizophrenic/?pregnant

## 2014-07-10 NOTE — ED Notes (Signed)
Per [facility] - pt from home w/ c/o sharp lower abd pain that began tonight, pt reports she is pregnant however pt's mother does not believe patient is pregnant - pt w/ hx of schizoaffective disorder. Pt admits to some vaginal discharge, pt w/o pain en route to the hospital. No other associating symptoms reported at this time.

## 2014-07-10 NOTE — ED Provider Notes (Signed)
CSN: [id number]     Arrival date & time 07/10/14  0405 History   First MD Initiated Contact with Patient 07/10/14 [phone]     Chief Complaint  Patient presents [id number]  . Abdominal Pain     (Consider location/radiation/quality/duration/timing/severity/associated sxs/prior Treatment) HPI Patient presents with lower abdominal cramping earlier this evening. It is completely resolved at this point. She denied to me any vaginal bleeding or discharge. She's had no fever or chills. She has had mild nausea but no vomiting. Denies constipation or diarrhea. She's concerned she may be pregnant and that she was having "labor pains". She states she's been compliant with her medications. She denies any hallucinations, suicidal ideations or homicidal ideations. Past Medical History  Diagnosis Date  . Schizophrenia   . CERVICITIS, GONOCOCCAL, History of 01/09/2007    Qualifier: History of  By: McDiarmid MD, Tawanna Coolerodd    . ATTENTION DEFICIT, W/O HYPERACTIVITY, History of 06/30/2006    Qualifier: History of  By: McDiarmid MD, Tawanna Coolerodd    . Depression   . 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 Dennie Bibleichard Larsen (Psych) At [facility] in  [place]. Qualifier: Hospitalized for  By: McDiarmid MD, Tawanna Coolerodd    . 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 TOBACCO USER 02/08/2009    Qualifier: Diagnosis of  By: Knox Royaltydell, Erin    . 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 ASC-cannot exclude HGSIL on Pap 02/15/2012    ASC-US on 02/03/2012 pap (associated Trichomonas infection). No reflex HPV testing performed on specimen.  Patient informed that she will need repeat Pap in one year.      . Diabetes mellitus     diet controlled  . Eczema   . Abnormal Pap smear    Past Surgical History  Procedure Laterality Date  . Incision and drainage      pilanodal cyst   Family History   Problem Relation Age of Onset  . Adopted: Yes  . Bipolar disorder Sister   . Alcohol abuse Brother   . Cancer Father   . Diabetes Mother    History  Substance Use Topics  . Smoking status: Current Every Day Smoker -- 0.50 packs/day for 10 years    Types: Cigarettes  . Smokeless tobacco: Never Used  . Alcohol Use: Yes     Comment: occ   OB History    Gravida Para Term Preterm AB TAB SAB Ectopic Multiple Living   1 1 1       1      Review of Systems  Constitutional: Negative for fever and chills.  Gastrointestinal: Positive for nausea and abdominal pain. Negative for vomiting, diarrhea and constipation.  Genitourinary: Negative for dysuria, frequency, flank pain, vaginal bleeding, vaginal discharge and pelvic pain.  Musculoskeletal: Negative for back pain, neck pain and neck stiffness.  Skin: Negative for rash and wound.  Neurological: Negative for dizziness, weakness, numbness and headaches.  Psychiatric/Behavioral: Negative for suicidal ideas, hallucinations, behavioral problems and agitation.  All other systems reviewed and are negative.     Allergies  Abilify  Home Medications   Prior to Admission medications   Medication Sig Start Date End Date Taking? Authorizing Provider  acetaminophen (TYLENOL) 500 MG tablet Take 500 mg by mouth every 6 (six) hours as needed for headache (headache).   Yes Historical  Provider, MD  divalproex (DEPAKOTE ER) 250 MG 24 hr tablet Take 3 tablets (750 mg total) by mouth 2 (two) times daily. Patient taking differently: Take 500 mg by mouth 2 (two) times daily.  04/17/14  Yes Thermon Leyland, NP  folic acid (FOLVITE) 1 MG tablet Take 1 mg by mouth daily.   Yes Historical Provider, MD  traZODone (DESYREL) 100 MG tablet Take 1 tablet (100 mg total) by mouth at bedtime. Patient taking differently: Take 100 mg by mouth daily.  04/17/14  Yes Thermon Leyland, NP  benztropine (COGENTIN) 0.5 MG tablet Take 1 tablet (0.5 mg total) by mouth 2 (two) times  daily. Patient not taking: Reported on 07/10/2014 04/17/14   Thermon Leyland, NP  benztropine mesylate (COGENTIN) 1 MG/ML injection Inject 0.5 mLs (0.5 mg total) into the muscle every 30 (thirty) days. First dose received on 04/16/15 with next dose due in thirty days. 05/16/14   Thermon Leyland, NP  haloperidol (HALDOL) 10 MG tablet Take 1 tablet (10 mg total) by mouth 2 (two) times daily. Patient not taking: Reported on 06/27/2014 04/17/14   Thermon Leyland, NP  haloperidol decanoate (HALDOL DECANOATE) 100 MG/ML injection Inject 1 mL (100 mg total) into the muscle every 30 (thirty) days. Patient not taking: Reported on 05/02/2014 05/16/14   Thermon Leyland, NP   BP 110/86 mmHg  Pulse 99  Temp(Src) 98.8 F (37.1 C) (Oral)  Resp 18  SpO2 100% Physical Exam  Constitutional: She is oriented to person, place, and time. She appears well-developed and well-nourished. No distress.  HENT:  Head: Normocephalic and atraumatic.  Mouth/Throat: Oropharynx is clear and moist.  Eyes: EOM are normal. Pupils are equal, round, and reactive to light.  Neck: Normal range of motion. Neck supple.  Cardiovascular: Normal rate and regular rhythm.   Pulmonary/Chest: Effort normal and breath sounds normal. No respiratory distress. She has no wheezes. She has no rales.  Abdominal: Soft. Bowel sounds are normal. She exhibits no distension and no mass. There is no tenderness. There is no rebound and no guarding.  Abdomen is soft and completely benign. Patient has no tenderness.  Musculoskeletal: Normal range of motion. She exhibits no edema or tenderness.  Neurological: She is alert and oriented to person, place, and time.  Skin: Skin is warm and dry. No rash noted. No erythema.  Psychiatric: She has a normal mood and affect. Her behavior is normal.  Patient is calm and cooperative. Denies suicidal ideation. Answering questions appropriately.  Nursing note and vitals reviewed.   ED Course  Procedures (including critical  care time) Labs Review Labs Reviewed  URINALYSIS, ROUTINE W REFLEX MICROSCOPIC  POC URINE PREG, ED    Imaging Review No results found.   EKG Interpretation None      MDM   Final diagnoses:  Abdominal cramping      Patient with episodic cramping that is now resolved. She has a benign abdominal exam. She specifically denies any vaginal discharge or bleeding to this provider. UA and hCG are both negative. Patient can follow-up with her primary physician. Return precautions given.  Loren Racer, MD 07/10/14 [phone]

## 2014-07-10 NOTE — Discharge Instructions (Signed)
Abdominal Pain, Women °Abdominal (stomach, pelvic, or belly) pain can be caused by many things. It is important to tell your doctor: °· The location of the pain. °· Does it come and go or is it present all the time? °· Are there things that start the pain (eating certain foods, exercise)? °· Are there other symptoms associated with the pain (fever, nausea, vomiting, diarrhea)? °All of this is helpful to know when trying to find the cause of the pain. °CAUSES  °· Stomach: virus or bacteria infection, or ulcer. °· Intestine: appendicitis (inflamed appendix), regional ileitis (Crohn's disease), ulcerative colitis (inflamed colon), irritable bowel syndrome, diverticulitis (inflamed diverticulum of the colon), or cancer of the stomach or intestine. °· Gallbladder disease or stones in the gallbladder. °· Kidney disease, kidney stones, or infection. °· Pancreas infection or cancer. °· Fibromyalgia (pain disorder). °· Diseases of the female organs: °¨ Uterus: fibroid (non-cancerous) tumors or infection. °¨ Fallopian tubes: infection or tubal pregnancy. °¨ Ovary: cysts or tumors. °¨ Pelvic adhesions (scar tissue). °¨ Endometriosis (uterus lining tissue growing in the pelvis and on the pelvic organs). °¨ Pelvic congestion syndrome (female organs filling up with blood just before the menstrual period). °¨ Pain with the menstrual period. °¨ Pain with ovulation (producing an egg). °¨ Pain with an IUD (intrauterine device, birth control) in the uterus. °¨ Cancer of the female organs. °· Functional pain (pain not caused by a disease, may improve without treatment). °· Psychological pain. °· Depression. °DIAGNOSIS  °Your doctor will decide the seriousness of your pain by doing an examination. °· Blood tests. °· X-rays. °· Ultrasound. °· CT scan (computed tomography, special type of X-ray). °· MRI (magnetic resonance imaging). °· Cultures, for infection. °· Barium enema (dye inserted in the large intestine, to better view it with  X-rays). °· Colonoscopy (looking in intestine with a lighted tube). °· Laparoscopy (minor surgery, looking in abdomen with a lighted tube). °· Major abdominal exploratory surgery (looking in abdomen with a large incision). °TREATMENT  °The treatment will depend on the cause of the pain.  °· Many cases can be observed and treated at home. °· Over-the-counter medicines recommended by your caregiver. °· Prescription medicine. °· Antibiotics, for infection. °· Birth control pills, for painful periods or for ovulation pain. °· Hormone treatment, for endometriosis. °· Nerve blocking injections. °· Physical therapy. °· Antidepressants. °· Counseling with a psychologist or psychiatrist. °· Minor or major surgery. °HOME CARE INSTRUCTIONS  °· Do not take laxatives, unless directed by your caregiver. °· Take over-the-counter pain medicine only if ordered by your caregiver. Do not take aspirin because it can cause an upset stomach or bleeding. °· Try a clear liquid diet (broth or water) as ordered by your caregiver. Slowly move to a bland diet, as tolerated, if the pain is related to the stomach or intestine. °· Have a thermometer and take your temperature several times a day, and record it. °· Bed rest and sleep, if it helps the pain. °· Avoid sexual intercourse, if it causes pain. °· Avoid stressful situations. °· Keep your follow-up appointments and tests, as your caregiver orders. °· If the pain does not go away with medicine or surgery, you may try: °¨ Acupuncture. °¨ Relaxation exercises (yoga, meditation). °¨ Group therapy. °¨ Counseling. °SEEK MEDICAL CARE IF:  °· You notice certain foods cause stomach pain. °· Your home care treatment is not helping your pain. °· You need stronger pain medicine. °· You want your IUD removed. °· You feel faint or   lightheaded. °· You develop nausea and vomiting. °· You develop a rash. °· You are having side effects or an allergy to your medicine. °SEEK IMMEDIATE MEDICAL CARE IF:  °· Your  pain does not go away or gets worse. °· You have a fever. °· Your pain is felt only in portions of the abdomen. The right side could possibly be appendicitis. The left lower portion of the abdomen could be colitis or diverticulitis. °· You are passing blood in your stools (bright red or black tarry stools, with or without vomiting). °· You have blood in your urine. °· You develop chills, with or without a fever. °· You pass out. °MAKE SURE YOU:  °· Understand these instructions. °· Will watch your condition. °· Will get help right away if you are not doing well or get worse. °Document Released: 02/14/2007 Document Revised: 09/03/2013 Document Reviewed: 03/06/2009 °[employer]® Patient Information ©2015 [employer]. This information is not intended to replace advice given to you by your health care provider. Make sure you discuss any questions you have with your health care provider. ° °

## 2014-07-22 DIAGNOSIS — F419 Anxiety disorder, unspecified: Secondary | ICD-10-CM | POA: Diagnosis not present

## 2014-07-23 DIAGNOSIS — F419 Anxiety disorder, unspecified: Secondary | ICD-10-CM | POA: Diagnosis not present

## 2014-07-25 DIAGNOSIS — F259 Schizoaffective disorder, unspecified: Secondary | ICD-10-CM | POA: Diagnosis not present

## 2014-08-05 DIAGNOSIS — F259 Schizoaffective disorder, unspecified: Secondary | ICD-10-CM | POA: Diagnosis not present

## 2014-08-06 ENCOUNTER — Emergency Department (HOSPITAL_COMMUNITY)
Admission: EM | Admit: 2014-08-06 | Discharge: 2014-08-06 | Disposition: A | Discharge status: Home / Self Care | Payer: Medicare Other | Attending: Emergency Medicine | Admitting: Emergency Medicine

## 2014-08-06 ENCOUNTER — Encounter (HOSPITAL_COMMUNITY): Payer: Self-pay

## 2014-08-06 DIAGNOSIS — Z872 Personal history of diseases of the skin and subcutaneous tissue: Secondary | ICD-10-CM | POA: Diagnosis not present

## 2014-08-06 DIAGNOSIS — Z8619 Personal history of other infectious and parasitic diseases: Secondary | ICD-10-CM | POA: Diagnosis not present

## 2014-08-06 DIAGNOSIS — Z79899 Other long term (current) drug therapy: Secondary | ICD-10-CM | POA: Diagnosis not present

## 2014-08-06 DIAGNOSIS — Z720 Tobacco use: Secondary | ICD-10-CM | POA: Insufficient documentation

## 2014-08-06 DIAGNOSIS — F259 Schizoaffective disorder, unspecified: Secondary | ICD-10-CM

## 2014-08-06 DIAGNOSIS — E119 Type 2 diabetes mellitus without complications: Secondary | ICD-10-CM | POA: Diagnosis not present

## 2014-08-06 DIAGNOSIS — N898 Other specified noninflammatory disorders of vagina: Secondary | ICD-10-CM | POA: Diagnosis not present

## 2014-08-06 DIAGNOSIS — F258 Other schizoaffective disorders: Secondary | ICD-10-CM | POA: Diagnosis not present

## 2014-08-06 DIAGNOSIS — Z046 Encounter for general psychiatric examination, requested by authority: Secondary | ICD-10-CM | POA: Diagnosis present

## 2014-08-06 LAB — BASIC METABOLIC PANEL
ANION GAP: 10 (ref 5–15)
BUN: 10 mg/dL (ref 6–23)
CALCIUM: 9.1 mg/dL (ref 8.4–10.5)
CO2: 24 mmol/L (ref 19–32)
Chloride: 103 mmol/L (ref 96–112)
Creatinine, Ser: 0.73 mg/dL (ref 0.50–1.10)
GFR calc Af Amer: >90 mL/min (ref >90)
GFR calc non Af Amer: >90 mL/min (ref >90)
Glucose, Bld: 117 mg/dL — ABNORMAL HIGH (ref 70–99)
Potassium: 3.9 mmol/L (ref 3.5–5.1)
Sodium: 137 mmol/L (ref 135–145)

## 2014-08-06 LAB — URINE MICROSCOPIC-ADD ON

## 2014-08-06 LAB — URINALYSIS, ROUTINE W REFLEX MICROSCOPIC
BILIRUBIN URINE: NEGATIVE
Glucose, UA: NEGATIVE mg/dL
Hgb urine dipstick: NEGATIVE
KETONES UR: NEGATIVE mg/dL
NITRITE: NEGATIVE
PROTEIN: NEGATIVE mg/dL
Specific Gravity, Urine: 1.019 (ref 1.005–1.030)
Urobilinogen, UA: 1 mg/dL (ref 0.0–1.0)
pH: 7 (ref 5.0–8.0)

## 2014-08-06 LAB — RAPID URINE DRUG SCREEN, HOSP PERFORMED
Amphetamines: NOT DETECTED
BARBITURATES: NOT DETECTED
Benzodiazepines: NOT DETECTED
Cocaine: NOT DETECTED
Opiates: NOT DETECTED
TETRAHYDROCANNABINOL: NOT DETECTED

## 2014-08-06 LAB — WET PREP, GENITAL
Clue Cells Wet Prep HPF POC: NONE SEEN
Trich, Wet Prep: NONE SEEN
Yeast Wet Prep HPF POC: NONE SEEN

## 2014-08-06 LAB — VALPROIC ACID LEVEL: VALPROIC ACID LVL: 43 ug/mL — AB (ref 50.0–100.0)

## 2014-08-06 LAB — CBC
HCT: 41.3 % (ref 36.0–46.0)
HEMOGLOBIN: 13.5 g/dL (ref 12.0–15.0)
MCH: 29.3 pg (ref 26.0–34.0)
MCHC: 32.7 g/dL (ref 30.0–36.0)
MCV: 89.6 fL (ref 78.0–100.0)
Platelets: 365 10*3/uL (ref 150–400)
RBC: 4.61 MIL/uL (ref 3.87–5.11)
RDW: 15.6 % — AB (ref 11.5–15.5)
WBC: 14.3 10*3/uL — AB (ref 4.0–10.5)

## 2014-08-06 MED ORDER — AZITHROMYCIN 250 MG PO TABS
1000.0000 mg | ORAL_TABLET | Freq: Once | ORAL | Status: AC
  Administered 2014-08-06: 1000 mg via ORAL
  Filled 2014-08-06: qty 4

## 2014-08-06 MED ORDER — NITROFURANTOIN MONOHYD MACRO 100 MG PO CAPS: 100.0000 mg | ORAL_CAPSULE | Freq: Two times a day (BID) | ORAL | Status: DC

## 2014-08-06 MED ORDER — CEFTRIAXONE SODIUM 250 MG IJ SOLR
250.0000 mg | Freq: Once | INTRAMUSCULAR | Status: AC
  Administered 2014-08-06: 250 mg via INTRAMUSCULAR
  Filled 2014-08-06: qty 250

## 2014-08-06 MED ORDER — LIDOCAINE HCL 1 % IJ SOLN
INTRAMUSCULAR | Status: AC
  Filled 2014-08-06: qty 20

## 2014-08-06 NOTE — ED Notes (Signed)
Pt being sent [facility], just needs med clearance blood work and assessment for vaginal discharge and then pt can go back [facility].

## 2014-08-06 NOTE — ED Provider Notes (Signed)
CSN: [id number]     Arrival date & time 08/06/14  1913 History   First MD Initiated Contact with Patient 08/06/14 1944     Chief Complaint  Patient presents with  . Medical Clearance  . Vaginal Discharge     (Consider location/radiation/quality/duration/timing/severity/associated sxs/prior Treatment) HPI Patient is here under IVC commitment for worse schizophrenia.  Patient is also complaining of vaginal irritation.  She was sent here for clearance from this.  Patient does not give me any other history.  She is unsure why she is here Past Medical History  Diagnosis Date  . Schizophrenia   . CERVICITIS, GONOCOCCAL, History of 01/09/2007    Qualifier: History of  By: McDiarmid MD, Tawanna Coolerodd    . ATTENTION DEFICIT, W/O HYPERACTIVITY, History of 06/30/2006    Qualifier: History of  By: McDiarmid MD, Tawanna Coolerodd    . Depression   . 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 Dennie Bibleichard Larsen (Psych) At [facility] in  [place], [place]. Qualifier: Hospitalized for  By: McDiarmid MD, Tawanna Coolerodd    . 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 TOBACCO USER 02/08/2009    Qualifier: Diagnosis of  By: Knox Royaltydell, Erin    . 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 ASC-cannot exclude HGSIL on Pap 02/15/2012    ASC-US on 02/03/2012 pap (associated Trichomonas infection). No reflex HPV testing performed on specimen.  Patient informed that she will need repeat Pap in one year.      . Diabetes mellitus     diet controlled  . Eczema   . Abnormal Pap smear    Past Surgical History  Procedure Laterality Date  . Incision and drainage      pilanodal cyst   Family History  Problem Relation Age of Onset  . Adopted: Yes  . Bipolar disorder Sister   . Alcohol abuse Brother   . Cancer Father   . Diabetes Mother    History  Substance Use Topics  . Smoking status: Current  Every Day Smoker -- 0.50 packs/day for 10 years    Types: Cigarettes  . Smokeless tobacco: Never Used  . Alcohol Use: Yes     Comment: occ   OB History    Gravida Para Term Preterm AB TAB SAB Ectopic Multiple Living   1 1 1       1      Review of Systems  Level V caveat applies due to uncooperativeness  Allergies  Abilify  Home Medications   Prior to Admission medications   Medication Sig Start Date End Date Taking? Authorizing Provider  benztropine mesylate (COGENTIN) 1 MG/ML injection Inject 0.5 mLs (0.5 mg total) into the muscle every 30 (thirty) days. First dose received on 04/16/15 with next dose due in thirty days. 05/16/14  Yes Thermon LeylandLaura A Davis, NP  divalproex (DEPAKOTE) 500 MG DR tablet Take 1,000 mg by mouth 2 (two) times daily. 07/22/14  Yes Historical Provider, MD  OLANZapine (ZYPREXA) 20 MG tablet Take 20 mg by mouth 2 (two) times daily. 07/22/14  Yes Historical Provider, MD  acetaminophen (TYLENOL) 500 MG tablet Take 500 mg by mouth every 6 (six) hours as needed for headache (headache).    Historical Provider, MD  benztropine (COGENTIN) 0.5 MG tablet Take 1 tablet (0.5 mg total) by mouth 2 (two) times daily. Patient  not taking: Reported on 07/10/2014 04/17/14   Thermon Leyland, NP  divalproex (DEPAKOTE ER) 250 MG 24 hr tablet Take 3 tablets (750 mg total) by mouth 2 (two) times daily. Patient not taking: Reported on 08/06/2014 04/17/14   Thermon Leyland, NP  folic acid (FOLVITE) 1 MG tablet Take 1 mg by mouth daily.    Historical Provider, MD  haloperidol (HALDOL) 10 MG tablet Take 1 tablet (10 mg total) by mouth 2 (two) times daily. Patient not taking: Reported on 06/27/2014 04/17/14   Thermon Leyland, NP  haloperidol decanoate (HALDOL DECANOATE) 100 MG/ML injection Inject 1 mL (100 mg total) into the muscle every 30 (thirty) days. Patient not taking: Reported on 05/02/2014 05/16/14   Thermon Leyland, NP  traZODone (DESYREL) 100 MG tablet Take 1 tablet (100 mg total) by mouth at  bedtime. Patient taking differently: Take 100 mg by mouth daily.  04/17/14   Thermon Leyland, NP   BP 126/90 mmHg  Pulse 92  Temp(Src) 98.6 F (37 C) (Oral)  Resp 16  SpO2 100%  LMP 07/06/2014 (Approximate) Physical Exam  Constitutional: She appears well-developed and well-nourished. No distress.  HENT:  Head: Normocephalic and atraumatic.  Mouth/Throat: Oropharynx is clear and moist.  Eyes: Pupils are equal, round, and reactive to light.  Neck: Normal range of motion. Neck supple.  Cardiovascular: Normal rate, regular rhythm and normal heart sounds.   Pulmonary/Chest: Effort normal and breath sounds normal. No respiratory distress.  Genitourinary: Cervix exhibits discharge. Cervix exhibits no motion tenderness. Right adnexum displays no tenderness. Left adnexum displays no tenderness. Vaginal discharge found.  Neurological: She is alert.  Skin: Skin is warm and dry. No rash noted. No erythema.  Nursing note and vitals reviewed.   ED Course  Procedures (including critical care time) Labs Review Labs Reviewed  WET PREP, GENITAL - Abnormal; Notable for the following:    WBC, Wet Prep HPF POC MANY (*)    All other components within normal limits  CBC - Abnormal; Notable for the following:    WBC 14.3 (*)    RDW 15.6 (*)    All other components within normal limits  BASIC METABOLIC PANEL - Abnormal; Notable for the following:    Glucose, Bld 117 (*)    All other components within normal limits  URINALYSIS, ROUTINE W REFLEX MICROSCOPIC - Abnormal; Notable for the following:    APPearance CLOUDY (*)    Leukocytes, UA MODERATE (*)    All other components within normal limits  VALPROIC ACID LEVEL - Abnormal; Notable for the following:    Valproic Acid Lvl 43.0 (*)    All other components within normal limits  URINE MICROSCOPIC-ADD ON - Abnormal; Notable for the following:    Squamous Epithelial / LPF MANY (*)    Bacteria, UA FEW (*)    All other components within normal limits   URINE RAPID DRUG SCREEN (HOSP PERFORMED)  GC/CHLAMYDIA PROBE AMP ([place])     MDM   Final diagnoses:  None   patient be sent back to [facility] she has been medically cleared.  I did treat her for STDs and I feel that/she has white cells in her urine   Charlestine Night, PA-C 08/06/14 2317  Purvis Sheffield, MD 08/07/14 0002

## 2014-08-06 NOTE — ED Notes (Signed)
Pt ambulated to the restroom to provide urine sample.

## 2014-08-06 NOTE — Discharge Instructions (Signed)
Return here as needed.  Follow-up with your primary care doctor °

## 2014-08-06 NOTE — ED Notes (Signed)
Pt presents with c/o medical clearance and vaginal discharge. [facility] has a bed at [facility] and can return once she is cleared. Pt also needs to be evaluated for her vaginal discharge that started a couple of days ago.

## 2014-08-07 LAB — GC/CHLAMYDIA PROBE AMP (~~LOC~~) NOT AT ARMC
Chlamydia: NEGATIVE
Neisseria Gonorrhea: NEGATIVE

## 2014-08-13 DIAGNOSIS — F259 Schizoaffective disorder, unspecified: Secondary | ICD-10-CM | POA: Diagnosis not present

## 2014-08-14 DIAGNOSIS — F259 Schizoaffective disorder, unspecified: Secondary | ICD-10-CM | POA: Diagnosis not present

## 2014-08-23 ENCOUNTER — Encounter (HOSPITAL_COMMUNITY): Payer: Self-pay | Admitting: Emergency Medicine

## 2014-08-23 ENCOUNTER — Emergency Department (HOSPITAL_COMMUNITY)
Admission: EM | Admit: 2014-08-23 | Discharge: 2014-08-25 | Disposition: A | Discharge status: Home / Self Care | Payer: Medicare Other | Attending: Emergency Medicine | Admitting: Emergency Medicine

## 2014-08-23 DIAGNOSIS — Z8619 Personal history of other infectious and parasitic diseases: Secondary | ICD-10-CM | POA: Diagnosis not present

## 2014-08-23 DIAGNOSIS — Z720 Tobacco use: Secondary | ICD-10-CM | POA: Insufficient documentation

## 2014-08-23 DIAGNOSIS — Z79899 Other long term (current) drug therapy: Secondary | ICD-10-CM | POA: Diagnosis not present

## 2014-08-23 DIAGNOSIS — F329 Major depressive disorder, single episode, unspecified: Secondary | ICD-10-CM | POA: Insufficient documentation

## 2014-08-23 DIAGNOSIS — Z872 Personal history of diseases of the skin and subcutaneous tissue: Secondary | ICD-10-CM | POA: Insufficient documentation

## 2014-08-23 DIAGNOSIS — F258 Other schizoaffective disorders: Secondary | ICD-10-CM | POA: Diagnosis not present

## 2014-08-23 DIAGNOSIS — F259 Schizoaffective disorder, unspecified: Secondary | ICD-10-CM | POA: Diagnosis not present

## 2014-08-23 DIAGNOSIS — E119 Type 2 diabetes mellitus without complications: Secondary | ICD-10-CM | POA: Insufficient documentation

## 2014-08-23 DIAGNOSIS — Z046 Encounter for general psychiatric examination, requested by authority: Secondary | ICD-10-CM | POA: Diagnosis present

## 2014-08-23 LAB — COMPREHENSIVE METABOLIC PANEL
ALT: 14 U/L (ref 0–35)
AST: 20 U/L (ref 0–37)
Albumin: 3.8 g/dL (ref 3.5–5.2)
Alkaline Phosphatase: 86 U/L (ref 39–117)
Anion gap: 6 (ref 5–15)
BILIRUBIN TOTAL: 0.4 mg/dL (ref 0.3–1.2)
BUN: 9 mg/dL (ref 6–23)
CALCIUM: 8.7 mg/dL (ref 8.4–10.5)
CO2: 24 mmol/L (ref 19–32)
CREATININE: 0.66 mg/dL (ref 0.50–1.10)
Chloride: 106 mmol/L (ref 96–112)
GFR calc Af Amer: >90 mL/min (ref >90)
GLUCOSE: 91 mg/dL (ref 70–99)
Potassium: 3.8 mmol/L (ref 3.5–5.1)
Sodium: 136 mmol/L (ref 135–145)
Total Protein: 7.8 g/dL (ref 6.0–8.3)

## 2014-08-23 LAB — SALICYLATE LEVEL

## 2014-08-23 LAB — CBC
HCT: 36.9 % (ref 36.0–46.0)
HEMOGLOBIN: 12.2 g/dL (ref 12.0–15.0)
MCH: 29.6 pg (ref 26.0–34.0)
MCHC: 33.1 g/dL (ref 30.0–36.0)
MCV: 89.6 fL (ref 78.0–100.0)
Platelets: 357 10*3/uL (ref 150–400)
RBC: 4.12 MIL/uL (ref 3.87–5.11)
RDW: 15.2 % (ref 11.5–15.5)
WBC: 10.5 10*3/uL (ref 4.0–10.5)

## 2014-08-23 LAB — RAPID URINE DRUG SCREEN, HOSP PERFORMED
AMPHETAMINES: NOT DETECTED
BARBITURATES: NOT DETECTED
BENZODIAZEPINES: NOT DETECTED
Cocaine: NOT DETECTED
Opiates: NOT DETECTED
Tetrahydrocannabinol: NOT DETECTED

## 2014-08-23 LAB — ETHANOL

## 2014-08-23 LAB — ACETAMINOPHEN LEVEL

## 2014-08-23 MED ORDER — NICOTINE 21 MG/24HR TD PT24
21.0000 mg | MEDICATED_PATCH | Freq: Every day | TRANSDERMAL | Status: DC
  Administered 2014-08-23 – 2014-08-25 (×3): 21 mg via TRANSDERMAL
  Filled 2014-08-23 (×4): qty 1

## 2014-08-23 MED ORDER — IBUPROFEN 200 MG PO TABS: 600.0000 mg | ORAL_TABLET | Freq: Three times a day (TID) | ORAL | Status: DC | PRN

## 2014-08-23 MED ORDER — ALUM & MAG HYDROXIDE-SIMETH 200-200-20 MG/5ML PO SUSP: 30.0000 mL | ORAL | Status: DC | PRN

## 2014-08-23 MED ORDER — ACETAMINOPHEN 325 MG PO TABS: 650.0000 mg | ORAL_TABLET | ORAL | Status: DC | PRN

## 2014-08-23 MED ORDER — LORAZEPAM 1 MG PO TABS
1.0000 mg | ORAL_TABLET | Freq: Three times a day (TID) | ORAL | Status: DC | PRN
  Administered 2014-08-23 – 2014-08-24 (×2): 1 mg via ORAL
  Filled 2014-08-23 (×2): qty 1

## 2014-08-23 MED ORDER — ZOLPIDEM TARTRATE 5 MG PO TABS
5.0000 mg | ORAL_TABLET | Freq: Every evening | ORAL | Status: DC | PRN
  Administered 2014-08-23: 5 mg via ORAL
  Filled 2014-08-23: qty 1

## 2014-08-23 MED ORDER — ONDANSETRON HCL 4 MG PO TABS
4.0000 mg | ORAL_TABLET | Freq: Three times a day (TID) | ORAL | Status: DC | PRN
Start: 2014-08-23 — End: 2014-08-25

## 2014-08-23 NOTE — ED Provider Notes (Signed)
CSN: [id number]     Arrival date & time 08/23/14  1727 History   First MD Initiated Contact with Patient 08/23/14 1733     Chief Complaint  Patient presents with  . IVC      (Consider location/radiation/quality/duration/timing/severity/associated sxs/prior Treatment) HPI Comments: Here under IVC. IVC taken out due to patient locations, threatening her family members. She also reportedly not taking her medications. I tried to get in touch with family member who took IVC but her phone number went straight to voicemail.   Patient is a 29 y.o. female presenting with mental health disorder. The history is provided by the patient and the police.  Mental Health Problem Presenting symptoms: aggressive behavior (threatening to harm her family members)   Presenting symptoms: no suicidal thoughts   Patient accompanied by:  Law enforcement Degree of incapacity (severity):  Severe Onset quality:  Gradual Timing:  Constant Progression:  Unchanged Chronicity:  Chronic Context: noncompliance     Past Medical History  Diagnosis Date  . Schizophrenia   . CERVICITIS, GONOCOCCAL, History of 01/09/2007    Qualifier: History of  By: McDiarmid MD, Tawanna Cooler    . ATTENTION DEFICIT, W/O HYPERACTIVITY, History of 06/30/2006    Qualifier: History of  By: McDiarmid MD, Tawanna Cooler    . Depression   . 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 Dennie Bible (Psych) At [facility] in  [place], [place]. Qualifier: Hospitalized for  By: McDiarmid MD, Tawanna Cooler    . 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 TOBACCO USER 02/08/2009    Qualifier: Diagnosis of  By: Knox Royalty    . 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 ASC-cannot exclude HGSIL on Pap 02/15/2012    ASC-US on 02/03/2012 pap (associated Trichomonas infection). No reflex HPV testing performed on specimen.   Patient informed that she will need repeat Pap in one year.      . Diabetes mellitus     diet controlled  . Eczema   . Abnormal Pap smear    Past Surgical History  Procedure Laterality Date  . Incision and drainage      pilanodal cyst   Family History  Problem Relation Age of Onset  . Adopted: Yes  . Bipolar disorder Sister   . Alcohol abuse Brother   . Cancer Father   . Diabetes Mother    History  Substance Use Topics  . Smoking status: Current Every Day Smoker -- 0.50 packs/day for 10 years    Types: Cigarettes  . Smokeless tobacco: Never Used  . Alcohol Use: Yes     Comment: occ   OB History    Gravida Para Term Preterm AB TAB SAB Ectopic Multiple Living   Review of Systems  Constitutional: Negative for fever.  Respiratory: Negative for cough and shortness of breath.   Psychiatric/Behavioral: Negative for suicidal ideas.  All other systems reviewed and are negative.     Allergies  Abilify  Home Medications   Prior to Admission medications   Medication Sig Start Date End Date Taking? Authorizing Provider  divalproex (DEPAKOTE) 500 MG DR tablet Take 1,000 mg by mouth 2 (two) times daily. 07/22/14  Yes Historical Provider, MD  folic acid (FOLVITE) 1 MG tablet Take 4  mg by mouth daily.    Yes Historical Provider, MD  OLANZapine (ZYPREXA) 20 MG tablet Take 20 mg by mouth 2 (two) times daily. 07/22/14  Yes Historical Provider, MD  traZODone (DESYREL) 100 MG tablet Take 1 tablet (100 mg total) by mouth at bedtime. Patient taking differently: Take 100 mg by mouth daily.  04/17/14  Yes Thermon LeylandLaura A Davis, NP  acetaminophen (TYLENOL) 500 MG tablet Take 500 mg by mouth every 6 (six) hours as needed for headache (headache).    Historical Provider, MD  benztropine (COGENTIN) 0.5 MG tablet Take 1 tablet (0.5 mg total) by mouth 2 (two) times daily. Patient not taking: Reported on 07/10/2014 04/17/14   Thermon LeylandLaura A Davis, NP  benztropine mesylate (COGENTIN) 1 MG/ML  injection Inject 0.5 mLs (0.5 mg total) into the muscle every 30 (thirty) days. First dose received on 04/16/15 with next dose due in thirty days. 05/16/14   Thermon LeylandLaura A Davis, NP  divalproex (DEPAKOTE ER) 250 MG 24 hr tablet Take 3 tablets (750 mg total) by mouth 2 (two) times daily. Patient not taking: Reported on 08/06/2014 04/17/14   Thermon LeylandLaura A Davis, NP  haloperidol (HALDOL) 10 MG tablet Take 1 tablet (10 mg total) by mouth 2 (two) times daily. Patient not taking: Reported on 06/27/2014 04/17/14   Thermon LeylandLaura A Davis, NP  haloperidol decanoate (HALDOL DECANOATE) 100 MG/ML injection Inject 1 mL (100 mg total) into the muscle every 30 (thirty) days. Patient not taking: Reported on 05/02/2014 05/16/14   Thermon LeylandLaura A Davis, NP  nitrofurantoin, macrocrystal-monohydrate, (MACROBID) 100 MG capsule Take 1 capsule (100 mg total) by mouth 2 (two) times daily. Patient not taking: Reported on 08/23/2014 08/06/14   Charlestine Nighthristopher Lawyer, PA-C   BP 136/101 mmHg  Pulse 110  Temp(Src) 98.4 F (36.9 C) (Oral)  Resp 16  SpO2 100%  LMP 07/06/2014 (Approximate) Physical Exam  Constitutional: She is oriented to person, place, and time. She appears well-developed and well-nourished. No distress.  Smells of human feces  HENT:  Head: Normocephalic and atraumatic.  Mouth/Throat: Oropharynx is clear and moist.  Eyes: EOM are normal. Pupils are equal, round, and reactive to light.  Neck: Normal range of motion. Neck supple.  Cardiovascular: Normal rate and regular rhythm.  Exam reveals no friction rub.   No murmur heard. Pulmonary/Chest: Effort normal and breath sounds normal. No respiratory distress. She has no wheezes. She has no rales.  Abdominal: Soft. She exhibits no distension. There is no tenderness. There is no rebound.  Musculoskeletal: Normal range of motion. She exhibits no edema.  Neurological: She is alert and oriented to person, place, and time. No cranial nerve deficit.  Skin: Skin is warm. No rash noted. She is not  diaphoretic.  Nursing note and vitals reviewed.   ED Course  Procedures (including critical care time) Labs Review Labs Reviewed  ACETAMINOPHEN LEVEL  CBC  COMPREHENSIVE METABOLIC PANEL  ETHANOL  SALICYLATE LEVEL  URINE RAPID DRUG SCREEN (HOSP PERFORMED)    Imaging Review No results found.   EKG Interpretation None      MDM   Final diagnoses:  Schizophrenia, unspecified type    29 year old female here under IVC. IVC reports aggressive behavior, threatening other people, and noncompliance of medications. She denies all these. She is well known to the ER for her schizophrenia. Here she smells of human feces, but has applied extensive makeup to her face. Plan on psych consult.    Elwin MochaBlair Jerusalen Mateja, MD 08/23/14 [phone]

## 2014-08-23 NOTE — ED Notes (Signed)
Pt. C/o insomnia. 

## 2014-08-23 NOTE — ED Notes (Signed)
Pt. Noted resting in room. No complaints or concerns voiced. No distress or abnormal behavior noted. Will continue to monitor with security cameras. Q 15 minute rounds continue. 
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
## 2014-08-23 NOTE — ED Notes (Signed)
Pt. To [facility] from ED ambulatory without difficulty, to room 38 . Pt. Is alert and oriented, warm and dry in no distress. Pt. Denies SI, HI, and AVH. Pt. Calm and cooperative. Pt. Made aware of security cameras and Q15 minute rounds. Pt. Encouraged to let Nursing staff know of any concerns or needs. Sandwich and soft drink given.

## 2014-08-23 NOTE — ED Notes (Signed)
Pt. Noted watching TV in room. No complaints or concerns voiced. No distress or abnormal behavior noted. Will continue to monitor with security cameras. Q 15 minute rounds continue.

## 2014-08-23 NOTE — ED Notes (Signed)
Pt. C/o anxiety and irritability.

## 2014-08-23 NOTE — [facility] Counselor (Signed)
Counselor faxed out supporting documentation to obtain placement for this pt to the following facilities:  [facility]   [facility]oya [place], [place] Counsel[place]

## 2014-08-23 NOTE — ED Notes (Signed)
Pt. Urinated in bed. Pt. Given new scrubs etc.

## 2014-08-23 NOTE — ED Notes (Signed)
Snack and beverage given. 

## 2014-08-24 DIAGNOSIS — F258 Other schizoaffective disorders: Secondary | ICD-10-CM

## 2014-08-24 MED ORDER — DIVALPROEX SODIUM 500 MG PO DR TAB
1000.0000 mg | DELAYED_RELEASE_TABLET | Freq: Two times a day (BID) | ORAL | Status: DC
  Administered 2014-08-24 – 2014-08-25 (×3): 1000 mg via ORAL
  Filled 2014-08-24 (×3): qty 2

## 2014-08-24 MED ORDER — DIVALPROEX SODIUM ER 500 MG PO TB24: 750.0000 mg | ORAL_TABLET | Freq: Two times a day (BID) | ORAL | Status: DC

## 2014-08-24 MED ORDER — TRAZODONE HCL 100 MG PO TABS
100.0000 mg | ORAL_TABLET | Freq: Every day | ORAL | Status: DC
  Administered 2014-08-24: 100 mg via ORAL
  Filled 2014-08-24: qty 1

## 2014-08-24 MED ORDER — OLANZAPINE 10 MG PO TABS
20.0000 mg | ORAL_TABLET | Freq: Two times a day (BID) | ORAL | Status: DC
  Administered 2014-08-24: 20 mg via ORAL
  Filled 2014-08-24: qty 2

## 2014-08-24 MED ORDER — BENZTROPINE MESYLATE 1 MG PO TABS
0.5000 mg | ORAL_TABLET | Freq: Two times a day (BID) | ORAL | Status: DC
  Administered 2014-08-24 – 2014-08-25 (×3): 0.5 mg via ORAL
  Filled 2014-08-24 (×3): qty 1

## 2014-08-24 MED ORDER — DIVALPROEX SODIUM ER 500 MG PO TB24
1000.0000 mg | ORAL_TABLET | Freq: Two times a day (BID) | ORAL | Status: DC
  Filled 2014-08-24 (×2): qty 2

## 2014-08-24 MED ORDER — OLANZAPINE 5 MG PO TABS
15.0000 mg | ORAL_TABLET | Freq: Two times a day (BID) | ORAL | Status: DC
  Administered 2014-08-24 – 2014-08-25 (×2): 15 mg via ORAL
  Filled 2014-08-24 (×4): qty 1

## 2014-08-24 NOTE — ED Notes (Signed)
Dr cobos and jamison np into see 

## 2014-08-24 NOTE — ED Notes (Signed)
Pt. Noted sleeping in room. No complaints or concerns voiced. No distress or abnormal behavior noted. Will continue to monitor with security cameras. Q 15 minute rounds continue. 

## 2014-08-24 NOTE — ED Notes (Signed)
Up on the phone 

## 2014-08-24 NOTE — ED Notes (Signed)
Up to the bathroom 

## 2014-08-24 NOTE — Progress Notes (Signed)
Pt referral faxed to the following facilities who report they are accepting referrals or have bed availability:  [facility]  Will continue seeking placement.  Chad CordialLauren Carter, LCSWA 08/24/2014 1:11 PM

## 2014-08-24 NOTE — ED Notes (Signed)
Pt. Noted in rest room. No complaints or concerns voiced. No distress or abnormal behavior noted. Will continue to monitor with security cameras. Q 15 minute rounds continue.  

## 2014-08-24 NOTE — ED Notes (Signed)
Report received from Janie Rambo RN. Pt. Sleeping, respirations regular and unlabored. Will continue to monitor for safety via security cameras and Q 15 minute checks. 

## 2014-08-24 NOTE — ED Notes (Signed)
Medic alert dog tag neckless removed from pt.  Pt reports that it is not her neckless

## 2014-08-24 NOTE — ED Notes (Signed)
[facility] and [facility]-pt is not actively si/hi or having avh
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
## 2014-08-24 NOTE — ED Notes (Signed)
On the phone 

## 2014-08-25 DIAGNOSIS — F258 Other schizoaffective disorders: Secondary | ICD-10-CM | POA: Diagnosis not present

## 2014-08-25 NOTE — ED Notes (Signed)
Pt. Noted sleeping in room. No complaints or concerns voiced. No distress or abnormal behavior noted. Will continue to monitor with security cameras. Q 15 minute rounds continue. 

## 2014-08-25 NOTE — ED Notes (Signed)
Pt refused d/c vitals.

## 2014-08-25 NOTE — H&P (Signed)
    History of Present Illness 26 yobf with schizophrenia, diabetes, obesity who does not bathe according to her group home manager. Began experiencing pain over her sacral region 3 - 4 days ago and began having a foul-smelling drainage today. Doesn't know about fever. Says she's never had this before.    Past History Obesity then, according to her group home manager: Schizophrenia Diabetes (diet controlled)   Past Med/Surgical Hx:  Schizophrenia:   Diabetes Mellitus, Type II (NIDD):   ALLERGIES:  No Known Allergies:     Medications according to her group home manager: Cogentin 1 mg bid Depakote 750 mg qhs and 500 mg qam Fluphenazine 15 mg qhs and 10 mg qam   Family and Social History:   Family History Non-Contributory    Social History positive  tobacco (Current within 1 year), negative ETOH, 1/2 ppd    Place of Living group home   Review of Systems:   Subjective/Chief Complaint last meal breakfast    Fever/Chills No    Cough No    Sputum No    Abdominal Pain No    Diarrhea No    Constipation No    Nausea/Vomiting No    SOB/DOE No    Chest Pain No    Dysuria No    Tolerating PT Yes    Tolerating Diet Yes    Medications/Allergies Reviewed Medications/Allergies reviewed   Physical Exam:   GEN well developed, obese, odoriferous    HEENT pink conjunctivae, PERRL, hearing intact to voice, Oropharynx clear    NECK supple  thyroid not tender  trachea midline    RESP normal resp effort  clear BS  no use of accessory muscles    CARD regular rate  no murmur    ABD denies tenderness  soft    LYMPH negative neck    EXTR negative cyanosis/clubbing, negative edema    SKIN normal to palpation, skin turgor good    NEURO cranial nerves intact, follows commands, strength:, motor/sensory function intact    PSYCH alert, poor insight    Additional Comments foull-smelling pus draining from indurated left of midline natal cleft pit      Assessment/Admission Diagnosis Pilonidal abscess / cellulitis    Plan IV Abx I & D Pilonidal abscess   Electronic Signatures: Claude MangesMarterre, Ziggy Chanthavong F (MD)  (Signed 23-Apr-13 17:38)  Authored: CHIEF COMPLAINT and HISTORY, PAST MEDICAL/SURGIAL HISTORY, ALLERGIES, OTHER MEDICATIONS, FAMILY AND SOCIAL HISTORY, REVIEW OF SYSTEMS, PHYSICAL EXAM, ASSESSMENT AND PLAN   Last Updated: 23-Apr-13 17:38 by Claude MangesMarterre, Clarrisa Kaylor F (MD)

## 2014-08-25 NOTE — ED Notes (Addendum)
Written dc instructions reviewed w/ pt.  Pt encouraged to take her medications as directed and follow up w/ her ACT team.  Pt ambulatory w/o difficulty to dc window,belongings returned after leaving the unit.

## 2014-08-25 NOTE — ED Notes (Signed)
Up to the bathroom 

## 2014-08-25 NOTE — ED Notes (Signed)
Pt. Noted sleeping in room. No complaints or concerns voiced. No distress or abnormal behavior noted. Will continue to monitor with security cameras. Q 15 minute rounds continue.l 
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
## 2014-08-25 NOTE — Op Note (Signed)
PATIENT NAMBiagio [name]:  Schnake, Ania MR#:  [id number] DATE OF BIRTH:  [birth year]  DATE OF PROCEDURE:  08/24/2011  PREOPERATIVE DIAGNOSIS: Pilonidal abscess.  POSTOPERATIVE DIAGNOSIS: Pilonidal abscess.  PROCEDURE PERFORMED: Incision and drainage of complex pilonidal abscess.  SURGEON: Nazeer Romney A. Egbert GaribaldiBird, MD  ANESTHESIA: Dr. Henrene HawkingKephart, general endotracheal.   FINDINGS: Pus.   SPECIMENS: Pus.   ESTIMATED BLOOD LOSS: Minimal.   DESCRIPTION OF PROCEDURE: With the patient in the supine position, general endotracheal anesthesia was induced. She was then padded and positioned left side up, right side down on bean bag with axillary roll and appropriate padding of pressure points. The intranatal cleft was prepped and draped utilizing Betadine solution. Timeout was observed. There was a small punctum of drainage from the intragluteal cleft. The main abscess was on the left side of the buttock. The 18-gauge needle was used to aspirate 10 mL of thick brown pus an aliquot of which were sent for microbacteriological analysis. A cruciate incision was fashioned at three points along the abscess cavity with electrocautery. A large abscess cavity measuring at least 10 x 10 cm was encountered underneath the subcutaneous tissues and extending medially and laterally and inferiorly. The wound was irrigated with saline. A half-inch Penrose drain was transversed across the cavity from the most cranial to the most caudal wound. The wound was then packed utilizing an entire bottle of half-inch iodoform gauze. 4 x 4's, ABDs, foam tape were applied.     The patient was then subsequently returned supine, extubated and taken to the recovery room in stable and satisfactory condition by anesthesia.  ____________________________ Redge GainerMark A. Egbert GaribaldiBird, MD mab:cms D: 08/25/2011 01:16:00 ET T: 08/25/2011 09:11:38 ET JOB#: [id number]  cc: Loraine LericheMark A. Egbert GaribaldiBird, MD, <Dictator>  Lexx Monte A Akyia Borelli MD ELECTRONICALLY SIGNED 08/25/2011 23:07
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
## 2014-08-25 NOTE — ED Notes (Signed)
Pt incontinent of urine during the night, linens/scrubs changed

## 2014-09-16 ENCOUNTER — Emergency Department (HOSPITAL_COMMUNITY)
Admission: EM | Admit: 2014-09-16 | Discharge: 2014-09-17 | Disposition: A | Discharge status: Home / Self Care | Payer: Medicare Other | Attending: Emergency Medicine | Admitting: Emergency Medicine

## 2014-09-16 ENCOUNTER — Encounter (HOSPITAL_COMMUNITY): Payer: Self-pay

## 2014-09-16 DIAGNOSIS — F209 Schizophrenia, unspecified: Secondary | ICD-10-CM | POA: Insufficient documentation

## 2014-09-16 DIAGNOSIS — Z872 Personal history of diseases of the skin and subcutaneous tissue: Secondary | ICD-10-CM | POA: Insufficient documentation

## 2014-09-16 DIAGNOSIS — Z720 Tobacco use: Secondary | ICD-10-CM | POA: Insufficient documentation

## 2014-09-16 DIAGNOSIS — R109 Unspecified abdominal pain: Secondary | ICD-10-CM | POA: Diagnosis not present

## 2014-09-16 DIAGNOSIS — F919 Conduct disorder, unspecified: Secondary | ICD-10-CM | POA: Diagnosis not present

## 2014-09-16 DIAGNOSIS — E119 Type 2 diabetes mellitus without complications: Secondary | ICD-10-CM | POA: Diagnosis not present

## 2014-09-16 DIAGNOSIS — F258 Other schizoaffective disorders: Secondary | ICD-10-CM | POA: Diagnosis not present

## 2014-09-16 DIAGNOSIS — F259 Schizoaffective disorder, unspecified: Secondary | ICD-10-CM | POA: Diagnosis not present

## 2014-09-16 DIAGNOSIS — Z8619 Personal history of other infectious and parasitic diseases: Secondary | ICD-10-CM | POA: Diagnosis not present

## 2014-09-16 DIAGNOSIS — Z79899 Other long term (current) drug therapy: Secondary | ICD-10-CM | POA: Insufficient documentation

## 2014-09-16 NOTE — ED Notes (Signed)
Writer attempted to draw blood work on pt, was unsuccessful attempt
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
## 2014-09-16 NOTE — ED Notes (Signed)
Pt will not let me draw labs, notified nurse.

## 2014-09-16 NOTE — ED Notes (Signed)
Pt eating dinner will draw labs afterwards.

## 2014-09-17 ENCOUNTER — Ambulatory Visit (HOSPITAL_COMMUNITY)
Admission: EM | Admit: 2014-09-17 | Discharge: 2014-09-17 | Disposition: A | Discharge status: Home / Self Care | Payer: Medicare Other | Source: Intra-hospital | Attending: Psychiatry | Admitting: Psychiatry

## 2014-09-17 ENCOUNTER — Encounter (HOSPITAL_COMMUNITY): Payer: Self-pay | Admitting: Emergency Medicine

## 2014-09-17 ENCOUNTER — Emergency Department (HOSPITAL_COMMUNITY)
Admission: EM | Admit: 2014-09-17 | Discharge: 2014-09-18 | Disposition: A | Discharge status: Home / Self Care | Payer: Medicare Other | Attending: Emergency Medicine | Admitting: Emergency Medicine

## 2014-09-17 DIAGNOSIS — Z79899 Other long term (current) drug therapy: Secondary | ICD-10-CM | POA: Diagnosis not present

## 2014-09-17 DIAGNOSIS — E119 Type 2 diabetes mellitus without complications: Secondary | ICD-10-CM | POA: Insufficient documentation

## 2014-09-17 DIAGNOSIS — F258 Other schizoaffective disorders: Secondary | ICD-10-CM | POA: Diagnosis not present

## 2014-09-17 DIAGNOSIS — F259 Schizoaffective disorder, unspecified: Secondary | ICD-10-CM | POA: Diagnosis not present

## 2014-09-17 DIAGNOSIS — F919 Conduct disorder, unspecified: Secondary | ICD-10-CM | POA: Insufficient documentation

## 2014-09-17 DIAGNOSIS — Z8619 Personal history of other infectious and parasitic diseases: Secondary | ICD-10-CM | POA: Diagnosis not present

## 2014-09-17 DIAGNOSIS — F209 Schizophrenia, unspecified: Secondary | ICD-10-CM | POA: Insufficient documentation

## 2014-09-17 DIAGNOSIS — Z872 Personal history of diseases of the skin and subcutaneous tissue: Secondary | ICD-10-CM | POA: Insufficient documentation

## 2014-09-17 DIAGNOSIS — F17210 Nicotine dependence, cigarettes, uncomplicated: Secondary | ICD-10-CM | POA: Insufficient documentation

## 2014-09-17 DIAGNOSIS — Z720 Tobacco use: Secondary | ICD-10-CM | POA: Diagnosis not present

## 2014-09-17 DIAGNOSIS — R4689 Other symptoms and signs involving appearance and behavior: Secondary | ICD-10-CM | POA: Diagnosis present

## 2014-09-17 DIAGNOSIS — Z008 Encounter for other general examination: Secondary | ICD-10-CM | POA: Diagnosis present

## 2014-09-17 DIAGNOSIS — Z3202 Encounter for pregnancy test, result negative: Secondary | ICD-10-CM | POA: Diagnosis not present

## 2014-09-17 DIAGNOSIS — F99 Mental disorder, not otherwise specified: Secondary | ICD-10-CM | POA: Diagnosis not present

## 2014-09-17 DIAGNOSIS — F23 Brief psychotic disorder: Secondary | ICD-10-CM | POA: Diagnosis not present

## 2014-09-17 LAB — CBC
HCT: 34.6 % — ABNORMAL LOW (ref 36.0–46.0)
HCT: 35.3 % — ABNORMAL LOW (ref 36.0–46.0)
HEMOGLOBIN: 11.4 g/dL — AB (ref 12.0–15.0)
Hemoglobin: 12 g/dL (ref 12.0–15.0)
MCH: 29.8 pg (ref 26.0–34.0)
MCH: 29.2 pg (ref 26.0–34.0)
MCHC: 34 g/dL (ref 30.0–36.0)
MCHC: 32.9 g/dL (ref 30.0–36.0)
MCV: 87.6 fL (ref 78.0–100.0)
MCV: 88.5 fL (ref 78.0–100.0)
PLATELETS: 362 10*3/uL (ref 150–400)
Platelets: 357 10*3/uL (ref 150–400)
RBC: 3.91 MIL/uL (ref 3.87–5.11)
RBC: 4.03 MIL/uL (ref 3.87–5.11)
RDW: 14.5 % (ref 11.5–15.5)
RDW: 14.4 % (ref 11.5–15.5)
WBC: 11.1 10*3/uL — ABNORMAL HIGH (ref 4.0–10.5)
WBC: 12.9 10*3/uL — ABNORMAL HIGH (ref 4.0–10.5)

## 2014-09-17 LAB — RAPID URINE DRUG SCREEN, HOSP PERFORMED
AMPHETAMINES: NOT DETECTED
Barbiturates: NOT DETECTED
Benzodiazepines: NOT DETECTED
COCAINE: NOT DETECTED
OPIATES: NOT DETECTED
TETRAHYDROCANNABINOL: NOT DETECTED

## 2014-09-17 LAB — SALICYLATE LEVEL: Salicylate Lvl: <4 mg/dL (ref 2.8–30.0)

## 2014-09-17 LAB — COMPREHENSIVE METABOLIC PANEL
ALT: 18 U/L (ref 14–54)
ALT: 19 U/L (ref 14–54)
AST: 32 U/L (ref 15–41)
AST: 33 U/L (ref 15–41)
Albumin: 3.5 g/dL (ref 3.5–5.0)
Albumin: 4 g/dL (ref 3.5–5.0)
Alkaline Phosphatase: 102 U/L (ref 38–126)
Alkaline Phosphatase: 104 U/L (ref 38–126)
Anion gap: 5 (ref 5–15)
Anion gap: 12 (ref 5–15)
BUN: 8 mg/dL (ref 6–20)
BUN: 9 mg/dL (ref 6–20)
CALCIUM: 9 mg/dL (ref 8.9–10.3)
CO2: 25 mmol/L (ref 22–32)
CO2: 22 mmol/L (ref 22–32)
CREATININE: 0.75 mg/dL (ref 0.44–1.00)
Calcium: 8.6 mg/dL — ABNORMAL LOW (ref 8.9–10.3)
Chloride: 106 mmol/L (ref 101–111)
Chloride: 102 mmol/L (ref 101–111)
Creatinine, Ser: 0.7 mg/dL (ref 0.44–1.00)
GFR calc Af Amer: >60 mL/min (ref >60)
GLUCOSE: 132 mg/dL — AB (ref 65–99)
GLUCOSE: 108 mg/dL — AB (ref 65–99)
Potassium: 3.4 mmol/L — ABNORMAL LOW (ref 3.5–5.1)
Potassium: 3.4 mmol/L — ABNORMAL LOW (ref 3.5–5.1)
SODIUM: 136 mmol/L (ref 135–145)
Sodium: 136 mmol/L (ref 135–145)
TOTAL PROTEIN: 7.4 g/dL (ref 6.5–8.1)
Total Bilirubin: 0.4 mg/dL (ref 0.3–1.2)
Total Bilirubin: 0.3 mg/dL (ref 0.3–1.2)
Total Protein: 7.8 g/dL (ref 6.5–8.1)

## 2014-09-17 LAB — ETHANOL

## 2014-09-17 LAB — PREGNANCY, URINE: PREG TEST UR: NEGATIVE

## 2014-09-17 LAB — ACETAMINOPHEN LEVEL
Acetaminophen (Tylenol), Serum: <10 ug/mL — ABNORMAL LOW (ref 10–30)
Acetaminophen (Tylenol), Serum: <10 ug/mL — ABNORMAL LOW (ref 10–30)

## 2014-09-17 MED ORDER — IBUPROFEN 200 MG PO TABS: 600.0000 mg | ORAL_TABLET | Freq: Three times a day (TID) | ORAL | Status: DC | PRN

## 2014-09-17 MED ORDER — OLANZAPINE 10 MG PO TABS: 20.0000 mg | ORAL_TABLET | Freq: Two times a day (BID) | ORAL | Status: DC

## 2014-09-17 MED ORDER — TRAZODONE HCL 100 MG PO TABS: 100.0000 mg | ORAL_TABLET | Freq: Every evening | ORAL | Status: DC | PRN

## 2014-09-17 MED ORDER — LORAZEPAM 1 MG PO TABS: 1.0000 mg | ORAL_TABLET | Freq: Three times a day (TID) | ORAL | Status: DC | PRN

## 2014-09-17 MED ORDER — ACETAMINOPHEN 325 MG PO TABS: 650.0000 mg | ORAL_TABLET | ORAL | Status: DC | PRN

## 2014-09-17 MED ORDER — HALOPERIDOL LACTATE 5 MG/ML IJ SOLN: 5.0000 mg | Freq: Once | INTRAMUSCULAR | Status: DC

## 2014-09-17 MED ORDER — LORAZEPAM 2 MG/ML IJ SOLN: 1.0000 mg | Freq: Once | INTRAMUSCULAR | Status: DC

## 2014-09-17 MED ORDER — ACETAMINOPHEN 325 MG PO TABS
650.0000 mg | ORAL_TABLET | Freq: Once | ORAL | Status: AC
  Administered 2014-09-17: 650 mg via ORAL
  Filled 2014-09-17: qty 2

## 2014-09-17 NOTE — ED Provider Notes (Signed)
CSN: [id number]     Arrival date & time 09/16/14  2246 History   First MD Initiated Contact with Patient 09/17/14 0014     Chief Complaint  Patient presents with  . Schizophrenia      (Consider location/radiation/quality/duration/timing/severity/associated sxs/prior Treatment) HPI 29 year old female presents to the emergency department with request for Ritalin.  Patient has history of schizophrenia.  She denies SI or HI.  Patient has eaten dinner and several snacks since initially entering the emergency department.  To me she complains of stomach pain, but cannot quantify or indicate where on her stomach she hurts.  She is requesting Tylenol.  She denies any other needs at this time. Past Medical History  Diagnosis Date  . Schizophrenia   . CERVICITIS, GONOCOCCAL, History of 01/09/2007    Qualifier: History of  By: McDiarmid MD, Tawanna Coolerodd    . ATTENTION DEFICIT, W/O HYPERACTIVITY, History of 06/30/2006    Qualifier: History of  By: McDiarmid MD, Tawanna Coolerodd    . Depression   . 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 Dennie Bibleichard Larsen (Psych) At [facility] in  [place], [place]. Qualifier: Hospitalized for  By: McDiarmid MD, Tawanna Coolerodd    . 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 TOBACCO USER 02/08/2009    Qualifier: Diagnosis of  By: Knox Royaltydell, Erin    . 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 ASC-cannot exclude HGSIL on Pap 02/15/2012    ASC-US on 02/03/2012 pap (associated Trichomonas infection). No reflex HPV testing performed on specimen.  Patient informed that she will need repeat Pap in one year.      . Diabetes mellitus     diet controlled  . Eczema   . Abnormal Pap smear    Past Surgical History  Procedure Laterality Date  . Incision and drainage      pilanodal cyst   Family History  Problem Relation Age of Onset  . Adopted: Yes  .  Bipolar disorder Sister   . Alcohol abuse Brother   . Cancer Father   . Diabetes Mother    History  Substance Use Topics  . Smoking status: Current Every Day Smoker -- 0.50 packs/day for 10 years    Types: Cigarettes  . Smokeless tobacco: Never Used  . Alcohol Use: Yes     Comment: occ   OB History    Gravida Para Term Preterm AB TAB SAB Ectopic Multiple Living   1 1 1       1      Review of Systems Until 5 caveat, psychiatric disorder   Allergies  Abilify  Home Medications   Prior to Admission medications   Medication Sig Start Date End Date Taking? Authorizing Provider  acetaminophen (TYLENOL) 500 MG tablet Take 500 mg by mouth every 6 (six) hours as needed for headache (headache).   Yes Historical Provider, MD  benztropine mesylate (COGENTIN) 1 MG/ML injection Inject 0.5 mLs (0.5 mg total) into the muscle every 30 (thirty) days. First dose received on 04/16/15 with next dose due in thirty days. 05/16/14  Yes Thermon LeylandLaura A Davis, NP  divalproex (DEPAKOTE) 500 MG DR tablet Take 1,000 mg by mouth 2 (two) times daily. 07/22/14  Yes Historical Provider, MD  folic acid (FOLVITE) 1 MG tablet Take 4 mg by mouth daily.    Yes Historical Provider,  MD  OLANZapine (ZYPREXA) 20 MG tablet Take 20 mg by mouth 2 (two) times daily. 07/22/14  Yes Historical Provider, MD  traZODone (DESYREL) 100 MG tablet Take 1 tablet (100 mg total) by mouth at bedtime. Patient taking differently: Take 100 mg by mouth daily.  04/17/14  Yes Thermon Leyland, NP  benztropine (COGENTIN) 0.5 MG tablet Take 1 tablet (0.5 mg total) by mouth 2 (two) times daily. Patient not taking: Reported on 07/10/2014 04/17/14   Thermon Leyland, NP  divalproex (DEPAKOTE ER) 250 MG 24 hr tablet Take 3 tablets (750 mg total) by mouth 2 (two) times daily. Patient not taking: Reported on 08/06/2014 04/17/14   Thermon Leyland, NP  haloperidol decanoate (HALDOL DECANOATE) 100 MG/ML injection Inject 1 mL (100 mg total) into the muscle every 30 (thirty)  days. Patient not taking: Reported on 05/02/2014 05/16/14   Thermon Leyland, NP  nitrofurantoin, macrocrystal-monohydrate, (MACROBID) 100 MG capsule Take 1 capsule (100 mg total) by mouth 2 (two) times daily. Patient not taking: Reported on 08/23/2014 08/06/14   Charlestine Night, PA-C   BP 172/105 mmHg  Pulse 87  Temp(Src) 99.2 F (37.3 C) (Oral)  Resp 18  Ht  (1.753 m)  Wt 180 lb (81.647 kg)  BMI 26.57 kg/m2  SpO2 100% Physical Exam  Constitutional: She is oriented to person, place, and time. She appears well-developed and well-nourished. No distress.  HENT:  Head: Normocephalic and atraumatic.  Nose: Nose normal.  Mouth/Throat: Oropharynx is clear and moist.  Eyes: Conjunctivae and EOM are normal. Pupils are equal, round, and reactive to light.  Neck: Normal range of motion. Neck supple. No JVD present. No tracheal deviation present. No thyromegaly present.  Cardiovascular: Normal rate, regular rhythm, normal heart sounds and intact distal pulses.  Exam reveals no gallop and no friction rub.   No murmur heard. Pulmonary/Chest: Effort normal and breath sounds normal. No stridor. No respiratory distress. She has no wheezes. She has no rales. She exhibits no tenderness.  Abdominal: Soft. Bowel sounds are normal. She exhibits no distension and no mass. There is no tenderness. There is no rebound and no guarding.  Musculoskeletal: Normal range of motion. She exhibits no edema or tenderness.  Lymphadenopathy:    She has no cervical adenopathy.  Neurological: She is alert and oriented to person, place, and time. She displays normal reflexes. She exhibits normal muscle tone. Coordination normal.  Skin: Skin is warm and dry. No rash noted. No erythema. No pallor.  Psychiatric:  Poor insight and judgment.  Patient is calm, response to questions.  Denies SI or HI.  Nursing note and vitals reviewed.   ED Course  Procedures (including critical care time) Labs Review Labs Reviewed  CBC -  Abnormal; Notable for the following:    WBC 11.1 (*)    Hemoglobin 11.4 (*)    HCT 34.6 (*)    All other components within normal limits  ACETAMINOPHEN LEVEL  COMPREHENSIVE METABOLIC PANEL  ETHANOL  SALICYLATE LEVEL  URINE RAPID DRUG SCREEN (HOSP PERFORMED)  PREGNANCY, URINE    Imaging Review No results found.   EKG Interpretation None      MDM   Final diagnoses:  Schizophrenia, unspecified type    29 year old female with history of schizophrenia who requests Ritalin.  I have informed her that we cannot prescribe that from the emergency department.  She denies any other acute issues at this time.  I discussed with her the need to follow-up with her mental  health provider, which she acknowledges.  As she is not acutely psychotic or IVC'd, she is safe for discharge home.  Marisa Severinlga Layaan Mott, MD 09/17/14 [phone]
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
## 2014-09-17 NOTE — ED Notes (Signed)
Pt has poured water all over triage computer, thrown step stool and slamming the door. [employer] and security at bedside.
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
## 2014-09-17 NOTE — Discharge Instructions (Signed)
Please follow-up with your mental health provider.   Schizophrenia Schizophrenia is a mental illness. It may cause disturbed or disorganized thinking, speech, or behavior. People with schizophrenia have problems functioning in one or more areas of life: work, school, home, or relationships. People with schizophrenia are at increased risk for suicide, certain chronic physical illnesses, and unhealthy behaviors, such as smoking and drug use. People who have family members with schizophrenia are at higher risk of developing the illness. Schizophrenia affects men and women equally but usually appears at an earlier age (teenage or early adult years) in men.  SYMPTOMS The earliest symptoms are often subtle (prodrome) and may go unnoticed until the illness becomes more severe (first-break psychosis). Symptoms of schizophrenia may be continuous or may come and go in severity. Episodes often are triggered by major life events, such as family stress, college, [employer] service, marriage, pregnancy or child birth, divorce, or loss of a loved one. People with schizophrenia may see, hear, or feel things that do not exist (hallucinations). They may have false beliefs in spite of obvious proof to the contrary (delusions). Sometimes speech is incoherent or behavior is odd or withdrawn.  DIAGNOSIS Schizophrenia is diagnosed through an assessment by your caregiver. Your caregiver will ask questions about your thoughts, behavior, mood, and ability to function in daily life. Your caregiver may ask questions about your medical history and use of alcohol or drugs, including prescription medication. Your caregiver may also order blood tests and imaging exams. Certain medical conditions and substances can cause symptoms that resemble schizophrenia. Your caregiver may refer you to a mental health specialist for evaluation. There are three major criterion for a diagnosis of schizophrenia:  Two or more of the following five symptoms  are present for a month or longer:  Delusions. Often the delusions are that you are being attacked, harassed, cheated, persecuted or conspired against (persecutory delusions).  Hallucinations.   Disorganized speech that does not make sense to others.  Grossly disorganized (confused or unfocused) behavior or extremely overactive or underactive motor activity (catatonia).  Negative symptoms such as bland or blunted emotions (flat affect), loss of will power (avolition), and withdrawal from social contacts (social isolation).  Level of functioning in one or more major areas of life (work, school, relationships, or self-care) is markedly below the level of functioning before the onset of illness.   There are continuous signs of illness (either mild symptoms or decreased level of functioning) for at least 6 months or longer. TREATMENT  Schizophrenia is a long-term illness. It is best controlled with continuous treatment rather than treatment only when symptoms occur. The following treatments are used to manage schizophrenia:  Medication--Medication is the most effective and important form of treatment for schizophrenia. Antipsychotic medications are usually prescribed to help manage schizophrenia. Other types of medication may be added to relieve any symptoms that may occur despite the use of antipsychotic medications.  Counseling or talk therapy--Individual, group, or family counseling may be helpful in providing education, support, and guidance. Many people with schizophrenia also benefit from social skills and job skills (vocational) training. A combination of medication and counseling is best for managing the disorder over time. A procedure in which electricity is applied to the brain through the scalp (electroconvulsive therapy) may be used to treat catatonic schizophrenia or schizophrenia in people who cannot take or do not respond to medication and counseling. Document Released: 04/16/2000  Document Revised: 12/20/2012 Document Reviewed: 07/12/2012 [facility] Patient Information 2015 [place]. This information is  not intended to replace advice given to you by your health care provider. Make sure you discuss any questions you have with your health care provider. ° °

## 2014-09-17 NOTE — [facility] Counselor (Addendum)
Per Donell SievertSpencer Simon, PA, pt meets inpt criteria. [facility] to seek placement.  TTS Counselor made attending RN Gearldine Bienenstock(Brandy) and current EDP (Dr Norlene Campbelltter) aware of disposition.    Cyndie MullAnna Glanda Spanbauer, [facility] Triage Specialist

## 2014-09-17 NOTE — ED Provider Notes (Signed)
CSN: [id number]     Arrival date & time 09/17/14  1914 History   First MD Initiated Contact with Patient 09/17/14 2115     Chief Complaint  Patient presents with  . Medical Clearance     (Consider location/radiation/quality/duration/timing/severity/associated sxs/prior Treatment) The history is provided by the patient.  Molly [name] is a 29 y.o. female history of schizophrenia, ADHD here presenting with pseudocyesis, agitation. Patient believes that she is 9 months and 4 days pregnant and is about to deliver twins. She was started out of her group home today claims that somebody took her medications. She was seen earlier in the day and was sent back to the group home. While in the ER, she was agitated and threw stepstool and slammed the door at staff and poured water over the computer. She thinks that she has a baby but doesn't know the age. She states that she had review is negative pregnancy test but had a tattoo on her and then eventually had the baby.    Level V caveat- agitation   Past Medical History  Diagnosis Date  . Schizophrenia   . CERVICITIS, GONOCOCCAL, History of 01/09/2007    Qualifier: History of  By: McDiarmid MD, Tawanna Cooler    . ATTENTION DEFICIT, W/O HYPERACTIVITY, History of 06/30/2006    Qualifier: History of  By: McDiarmid MD, Tawanna Cooler    . Depression   . 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 Dennie Bible (Psych) At [facility] in  [place], [place]. Qualifier: Hospitalized for  By: McDiarmid MD, Tawanna Cooler    . 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 TOBACCO USER 02/08/2009    Qualifier: Diagnosis of  By: Knox Royalty    . 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 ASC-cannot exclude HGSIL on Pap 02/15/2012    ASC-US on 02/03/2012 pap (associated Trichomonas infection). No reflex HPV testing performed on specimen.  Patient  informed that she will need repeat Pap in one year.      . Diabetes mellitus     diet controlled  . Eczema   . Abnormal Pap smear    Past Surgical History  Procedure Laterality Date  . Incision and drainage      pilanodal cyst   Family History  Problem Relation Age of Onset  . Adopted: Yes  . Bipolar disorder Sister   . Alcohol abuse Brother   . Cancer Father   . Diabetes Mother    History  Substance Use Topics  . Smoking status: Current Every Day Smoker -- 0.50 packs/day for 10 years    Types: Cigarettes  . Smokeless tobacco: Never Used  . Alcohol Use: Yes     Comment: occ   OB History    Gravida Para Term Preterm AB TAB SAB Ectopic Multiple Living   Review of Systems  Psychiatric/Behavioral: Positive for agitation.  All other systems reviewed and are negative.     Allergies  Abilify  Home Medications   Prior to Admission medications   Medication Sig Start Date End Date Taking? Authorizing Provider  acetaminophen (TYLENOL) 500 MG tablet Take 500 mg by mouth every 6 (six) hours as needed for headache (headache).    Historical Provider, MD  benztropine (COGENTIN) 0.5  MG tablet Take 1 tablet (0.5 mg total) by mouth 2 (two) times daily. Patient not taking: Reported on 07/10/2014 04/17/14   Thermon LeylandLaura A Davis, NP  benztropine mesylate (COGENTIN) 1 MG/ML injection Inject 0.5 mLs (0.5 mg total) into the muscle every 30 (thirty) days. First dose received on 04/16/15 with next dose due in thirty days. 05/16/14   Thermon LeylandLaura A Davis, NP  divalproex (DEPAKOTE ER) 250 MG 24 hr tablet Take 3 tablets (750 mg total) by mouth 2 (two) times daily. Patient not taking: Reported on 08/06/2014 04/17/14   Thermon LeylandLaura A Davis, NP  divalproex (DEPAKOTE) 500 MG DR tablet Take 1,000 mg by mouth 2 (two) times daily. 07/22/14   Historical Provider, MD  folic acid (FOLVITE) 1 MG tablet Take 4 mg by mouth daily.     Historical Provider, MD  haloperidol decanoate (HALDOL DECANOATE) 100 MG/ML  injection Inject 1 mL (100 mg total) into the muscle every 30 (thirty) days. Patient not taking: Reported on 05/02/2014 05/16/14   Thermon LeylandLaura A Davis, NP  nitrofurantoin, macrocrystal-monohydrate, (MACROBID) 100 MG capsule Take 1 capsule (100 mg total) by mouth 2 (two) times daily. Patient not taking: Reported on 08/23/2014 08/06/14   Charlestine Nighthristopher Lawyer, PA-C  OLANZapine (ZYPREXA) 20 MG tablet Take 20 mg by mouth 2 (two) times daily. 07/22/14   Historical Provider, MD  traZODone (DESYREL) 100 MG tablet Take 1 tablet (100 mg total) by mouth at bedtime. Patient taking differently: Take 100 mg by mouth daily.  04/17/14   Thermon LeylandLaura A Davis, NP   BP 143/84 mmHg  Pulse 109  Temp(Src) 98.8 F (37.1 C) (Oral)  Resp 22  SpO2 100% Physical Exam  Constitutional: She is oriented to person, place, and time.  Agitated   HENT:  Head: Normocephalic.  Mouth/Throat: Oropharynx is clear and moist.  Eyes: Conjunctivae are normal. Pupils are equal, round, and reactive to light.  Neck: Normal range of motion. Neck supple.  Cardiovascular: Normal rate, regular rhythm and normal heart sounds.   Pulmonary/Chest: Effort normal and breath sounds normal. No respiratory distress. She has no wheezes. She has no rales.  Abdominal: Soft. Bowel sounds are normal. She exhibits no distension. There is no tenderness. There is no rebound.  Musculoskeletal: Normal range of motion. She exhibits no edema or tenderness.  Neurological: She is alert and oriented to person, place, and time. No cranial nerve deficit. Coordination normal.  Skin: Skin is warm and dry.  Psychiatric:  Agitated. Poor judgment.   Nursing note and vitals reviewed.   ED Course  Procedures (including critical care time) Labs Review Labs Reviewed  ACETAMINOPHEN LEVEL - Abnormal; Notable for the following:    Acetaminophen (Tylenol), Serum <10 (*)    All other components within normal limits  CBC - Abnormal; Notable for the following:    WBC 12.9 (*)    HCT  35.3 (*)    All other components within normal limits  COMPREHENSIVE METABOLIC PANEL - Abnormal; Notable for the following:    Potassium 3.4 (*)    Glucose, Bld 132 (*)    All other components within normal limits  ETHANOL  SALICYLATE LEVEL  URINE RAPID DRUG SCREEN (HOSP PERFORMED)  PREGNANCY, URINE    Imaging Review No results found.   EKG Interpretation None      MDM   Final diagnoses:  None    [name] is a 29 y.o. female here with agitation, pseudocyesis. I think the psychosis is new since yesterday. I filled out IVC paperwork.  Labs unremarkable. Will consult TTS. Will give ativan, haldol for agitation.      Richardean Canalavid H Fox Salminen, MD 09/17/14 2141

## 2014-09-17 NOTE — ED Notes (Signed)
Patient is alert and oriented x3.  She was given DC instructions and follow up visit instructions.  Patient gave verbal understanding. She was DC ambulatory under her own power to home.  V/S stable.  He was not showing any signs of distress on DC 

## 2014-09-17 NOTE — ED Notes (Signed)
Speaking with Tobi Bastosnna, TTS, via telepsych machine.

## 2014-09-18 DIAGNOSIS — F258 Other schizoaffective disorders: Secondary | ICD-10-CM

## 2014-09-18 NOTE — BH Assessment (Signed)
Inpt recommended, no [facility] female beds [facility] available [facility]. TTS to seek placement. Sent referrals to: [facility]   MorrisonNancy Avril Busser, [place] Triage Specialist 09/18/2014 12:05 AM

## 2014-09-18 NOTE — ED Notes (Signed)
She is soundly sleeping with her skin being normal, warm and dry and she is breathing normally.

## 2014-09-18 NOTE — Consult Note (Signed)
[facility] Consult   Reason for Consult:  Delusional Referring Physician:  EDP Patient Identification: Molly [name] MRN:  [id number] Principal Diagnosis: Schizoaffective disorder-chronic with exacerbation Diagnosis:   Patient Active Problem List   Diagnosis Date Noted  . Schizoaffective disorder-chronic with exacerbation [F25.8] 02/06/2014    Priority: High  . Aggressive behavior [F60.89] 08/16/2013    Priority: High  . Psychoses [F29]   . 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 Elevated WBC count [D72.829] 04/07/2014  . Noncompliance with medication regimen [Z91.14] 04/07/2014  . Papular rash, generalized [R21] 09/01/2012  . Amenorrhea [N91.2] 04/25/2012  . ASC-cannot exclude HGSIL on Pap [R89.6] 02/15/2012  . Screening for malignant neoplasm of the cervix [Z12.4] 02/01/2012  . 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 Obesity, unspecified [E66.9] 02/10/2009  . TOBACCO USER [Z72.0] 02/08/2009  . DIABETES MELLITUS [E11.9] 04/02/2008  . CERVICITIS, GONOCOCCAL, History of [A54.03] 01/09/2007  . TRICHOMONAL VAGINITIS [A59.01] 01/09/2007  . ECZEMA, ATOPIC DERMATITIS [L20.9] 06/30/2006    Total Time spent with patient: 45 minutes  Subjective:   Molly [name] is a 29 y.o. female patient does not warrant admission.  HPI:  The patient has been taking her medications since yesterday and is no longer delusional.  She also denies suicidal/homicidal ideations, hallucinations, and alcohol/drug abuse.  Molly [name] would like to leave and return to her group home.  Stable for discharge. HPI Elements:   Location:  generalized. Quality:  acute. Severity:  mild. Timing:  intermittent. Duration:  few hours. Context:  not taking her medications.  Past Medical History:  Past Medical History  Diagnosis Date  . Schizophrenia   . CERVICITIS, GONOCOCCAL, History of 01/09/2007    Qualifier: History of  By: McDiarmid MD, Sherren Mocha    . ATTENTION DEFICIT, W/O  HYPERACTIVITY, History of 06/30/2006    Qualifier: History of  By: McDiarmid MD, Sherren Mocha    . Depression   . 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 Henrene Dodge (Psych) At [facility] in  [place], [place]. Qualifier: Hospitalized for  By: McDiarmid MD, Sherren Mocha    . 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 TOBACCO USER 02/08/2009    Qualifier: Diagnosis of  By: Samara Snide    . 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 ASC-cannot exclude HGSIL on Pap 02/15/2012    ASC-US on 02/03/2012 pap (associated Trichomonas infection). No reflex HPV testing performed on specimen.  Patient informed that she will need repeat Pap in one year.      . Diabetes mellitus     diet controlled  . Eczema   . Abnormal Pap smear     Past Surgical History  Procedure Laterality Date  . Incision and drainage      pilanodal cyst   Family History:  Family History  Problem Relation Age of Onset  . Adopted: Yes  . Bipolar disorder Sister   . Alcohol abuse Brother   . Cancer Father   . Diabetes Mother    Social History:  History  Alcohol Use  . Yes    Comment: occ     History  Drug Use No    History   Social History  . Marital Status: Single    Spouse Name: N/A  . Number of Children: N/A  . Years of Education:  N/A   Social History Main Topics  . Smoking status: Current Every Day Smoker -- 0.50 packs/day for 10 years    Types: Cigarettes  . Smokeless tobacco: Never Used  . Alcohol Use: Yes     Comment: occ  . Drug Use: No  . Sexual Activity: Yes    Birth Control/ Protection: None   Other Topics Concern  . None   Social History Narrative   Adopted   Living in [place] home with [name]   Transportation: Clinical biochemist Social History:    Pain Medications: See PTA List Prescriptions: See PTA list Over the Counter: See PTA  List History of alcohol / drug use?: No history of alcohol / drug abuse                     Allergies:   Allergies  Allergen Reactions  . Abilify [Aripiprazole] Other (See Comments)    Thinks its nasty- does not want it.  Injection is ok.      Labs:  Results for orders placed or performed during the hospital encounter of 09/17/14 (from the past 48 hour(s))  Urine Drug Screen     Status: None   Collection Time: 09/17/14  7:57 PM  Result Value Ref Range   Opiates NONE DETECTED NONE DETECTED   Cocaine NONE DETECTED NONE DETECTED   Benzodiazepines NONE DETECTED NONE DETECTED   Amphetamines NONE DETECTED NONE DETECTED   Tetrahydrocannabinol NONE DETECTED NONE DETECTED   Barbiturates NONE DETECTED NONE DETECTED    Comment:        DRUG SCREEN FOR MEDICAL PURPOSES ONLY.  IF CONFIRMATION IS NEEDED FOR ANY PURPOSE, NOTIFY LAB WITHIN 5 DAYS.        LOWEST DETECTABLE LIMITS FOR URINE DRUG SCREEN Drug Class       Cutoff (ng/mL) Amphetamine      1000 Barbiturate      200 Benzodiazepine   627 Tricyclics       035 Opiates          300 Cocaine          300 THC              50   Pregnancy, urine     Status: None   Collection Time: 09/17/14  7:57 PM  Result Value Ref Range   Preg Test, Ur NEGATIVE NEGATIVE    Comment:        THE SENSITIVITY OF THIS METHODOLOGY IS >20 mIU/mL.   Acetaminophen level     Status: Abnormal   Collection Time: 09/17/14  8:08 PM  Result Value Ref Range   Acetaminophen (Tylenol), Serum <10 (L) 10 - 30 ug/mL    Comment:        THERAPEUTIC CONCENTRATIONS VARY SIGNIFICANTLY. A RANGE OF 10-30 ug/mL MAY BE AN EFFECTIVE CONCENTRATION FOR MANY PATIENTS. HOWEVER, SOME ARE BEST TREATED AT CONCENTRATIONS OUTSIDE THIS RANGE. ACETAMINOPHEN CONCENTRATIONS >150 ug/mL AT 4 HOURS AFTER INGESTION AND >50 ug/mL AT 12 HOURS AFTER INGESTION ARE OFTEN ASSOCIATED WITH TOXIC REACTIONS.   Ethanol (ETOH)     Status: None   Collection Time: 09/17/14  8:08 PM   Result Value Ref Range   Alcohol, Ethyl (B) <5 <5 mg/dL    Comment:        LOWEST DETECTABLE LIMIT FOR SERUM ALCOHOL IS 11 mg/dL FOR MEDICAL PURPOSES ONLY   Salicylate level     Status: None   Collection Time: 09/17/14  8:08 PM  Result  Value Ref Range   Salicylate Lvl <2.9 2.8 - 30.0 mg/dL  CBC     Status: Abnormal   Collection Time: 09/17/14  8:09 PM  Result Value Ref Range   WBC 12.9 (H) 4.0 - 10.5 K/uL   RBC 4.03 3.87 - 5.11 MIL/uL   Hemoglobin 12.0 12.0 - 15.0 g/dL   HCT 35.3 (L) 36.0 - 46.0 %   MCV 87.6 78.0 - 100.0 fL   MCH 29.8 26.0 - 34.0 pg   MCHC 34.0 30.0 - 36.0 g/dL   RDW 14.4 11.5 - 15.5 %   Platelets 362 150 - 400 K/uL  Comprehensive metabolic panel     Status: Abnormal   Collection Time: 09/17/14  8:09 PM  Result Value Ref Range   Sodium 136 135 - 145 mmol/L   Potassium 3.4 (L) 3.5 - 5.1 mmol/L   Chloride 102 101 - 111 mmol/L   CO2 22 22 - 32 mmol/L   Glucose, Bld 132 (H) 65 - 99 mg/dL   BUN 9 6 - 20 mg/dL   Creatinine, Ser 0.75 0.44 - 1.00 mg/dL   Calcium 9.0 8.9 - 10.3 mg/dL   Total Protein 7.8 6.5 - 8.1 g/dL   Albumin 4.0 3.5 - 5.0 g/dL   AST 32 15 - 41 U/L   ALT 19 14 - 54 U/L   Alkaline Phosphatase 104 38 - 126 U/L   Total Bilirubin 0.3 0.3 - 1.2 mg/dL   GFR calc non Af Amer >60 >60 mL/min   GFR calc Af Amer >60 >60 mL/min    Comment: (NOTE) The eGFR has been calculated using the CKD EPI equation. This calculation has not been validated in all clinical situations. eGFR's persistently <60 mL/min signify possible Chronic Kidney Disease.    Anion gap 12 5 - 15    Vitals: Blood pressure 126/75, pulse 102, temperature 98.5 F (36.9 C), temperature source Oral, resp. rate 16, SpO2 95 %.  Risk to Self: Suicidal Ideation: No-Not Currently/Within Last 6 Months Suicidal Intent: No Is patient at risk for suicide?: No Suicidal Plan?: No-Not Currently/Within Last 6 Months Access to Means: No What has been your use of drugs/alcohol within the last 12  months?: UTA How many times?: 0 Other Self Harm Risks: None known Triggers for Past Attempts:  (n/a) Intentional Self Injurious Behavior: None Risk to Others: Homicidal Ideation: No Thoughts of Harm to Others: No Comment - Thoughts of Harm to Others: Pt denies Current Homicidal Intent: No Current Homicidal Plan: No Describe Current Homicidal Plan: n/a Access to Homicidal Means: No Identified Victim: n/a History of harm to others?: Yes Assessment of Violence: On admission Violent Behavior Description: Pt aggressive on admission AEB slamming doors and destroying property Does patient have access to weapons?: No Criminal Charges Pending?: No Does patient have a court date: No Prior Inpatient Therapy: Prior Inpatient Therapy: Yes Prior Therapy Dates: Multiple in 2016 and prior years Prior Therapy Facilty/Provider(s): [facility] Reason for Treatment: Schizophrenia/Psychosis Prior Outpatient Therapy: Prior Outpatient Therapy: Yes Prior Therapy Dates: 2016 Prior Therapy Facilty/Provider(s): [facility] Reason for Treatment: Schizophrenia Does patient have an ACCT team?: Yes Does patient have Intensive In-House Services?  : No Does patient have [facility] services? : No Does patient have P4CC services?: No  Current Facility-Administered Medications  Medication Dose Route Frequency Provider Last Rate Last Dose  . acetaminophen (TYLENOL) tablet 650 mg  650 mg Oral Q4H PRN Wandra Arthurs, MD      . haloperidol lactate (HALDOL) injection 5  mg  5 mg Intramuscular Once Wandra Arthurs, MD   Stopped at 09/17/14 2243  . ibuprofen (ADVIL,MOTRIN) tablet 600 mg  600 mg Oral Q8H PRN Wandra Arthurs, MD      . LORazepam (ATIVAN) injection 1 mg  1 mg Intramuscular Once Wandra Arthurs, MD   Stopped at 09/17/14 2239  . LORazepam (ATIVAN) tablet 1 mg  1 mg Oral Q8H PRN Wandra Arthurs, MD      . OLANZapine [facility]) tablet 20 mg  20 mg Oral BID Wandra Arthurs, MD   Stopped at 09/17/14 2300  . traZODone (DESYREL) tablet  100 mg  100 mg Oral QHS PRN Wandra Arthurs, MD       Current Outpatient Prescriptions  Medication Sig Dispense Refill  . acetaminophen (TYLENOL) 500 MG tablet Take 500 mg by mouth every 6 (six) hours as needed for headache (headache).    . divalproex (DEPAKOTE) 500 MG DR tablet Take 1,000 mg by mouth 2 (two) times daily.  0  . folic acid (FOLVITE) 1 MG tablet Take 4 mg by mouth daily.     Marland Kitchen OLANZapine (ZYPREXA) 20 MG tablet Take 20 mg by mouth 2 (two) times daily.  0  . traZODone (DESYREL) 100 MG tablet Take 1 tablet (100 mg total) by mouth at bedtime. (Patient taking differently: Take 100 mg by mouth daily. ) 30 tablet 0  . benztropine (COGENTIN) 0.5 MG tablet Take 1 tablet (0.5 mg total) by mouth 2 (two) times daily. (Patient not taking: Reported on 07/10/2014) 60 tablet 0  . benztropine mesylate (COGENTIN) 1 MG/ML injection Inject 0.5 mLs (0.5 mg total) into the muscle every 30 (thirty) days. First dose received on 04/16/15 with next dose due in thirty days. 2 mL 0  . divalproex (DEPAKOTE ER) 250 MG 24 hr tablet Take 3 tablets (750 mg total) by mouth 2 (two) times daily. (Patient not taking: Reported on 08/06/2014) 180 tablet 0  . haloperidol decanoate (HALDOL DECANOATE) 100 MG/ML injection Inject 1 mL (100 mg total) into the muscle every 30 (thirty) days. (Patient not taking: Reported on 05/02/2014) 1 mL 0  . nitrofurantoin, macrocrystal-monohydrate, (MACROBID) 100 MG capsule Take 1 capsule (100 mg total) by mouth 2 (two) times daily. (Patient not taking: Reported on 08/23/2014) 10 capsule 0    Musculoskeletal: Strength & Muscle Tone: within normal limits Gait & Station: normal Patient leans: N/A  Psychiatric Specialty Exam: Physical Exam  Review of Systems  Constitutional: Negative.   HENT: Negative.   Eyes: Negative.   Respiratory: Negative.   Cardiovascular: Negative.   Gastrointestinal: Negative.   Genitourinary: Negative.   Musculoskeletal: Negative.   Skin: Negative.    Neurological: Negative.   Endo/Heme/Allergies: Negative.   Psychiatric/Behavioral: The patient is nervous/anxious.     Blood pressure 126/75, pulse 102, temperature 98.5 F (36.9 C), temperature source Oral, resp. rate 16, SpO2 95 %.There is no weight on file to calculate BMI.  General Appearance: Casual  Eye Contact::  Good  Speech:  Normal Rate  Volume:  Normal  Mood:  Anxious, mild  Affect:  Congruent  Thought Process:  Coherent  Orientation:  Full (Time, Place, and Person)  Thought Content:  WDL  Suicidal Thoughts:  No  Homicidal Thoughts:  No  Memory:  Immediate;   Good Recent;   Good Remote;   Good  Judgement:  Fair  Insight:  Fair  Psychomotor Activity:  Normal  Concentration:  Good  Recall:  Good  Fund of  Knowledge:Fair  Language: Good  Akathisia:  No  Handed:  Right  AIMS (if indicated):     Assets:  Financial Resources/Insurance Housing Leisure Time Physical Health Resilience Social Support  ADL's:  Intact  Cognition: WNL  Sleep:      Medical Decision Making: Review of Psycho-Social Stressors (1), Review or order clinical lab tests (1) and Review of Medication Regimen & Side Effects (2)  Treatment Plan Summary: Daily contact with patient to assess and evaluate symptoms and progress in treatment, Medication management and Plan discharge to group home with followup at her regular outpatient providers  Plan:  No evidence of imminent risk to self or others at present.   Disposition: discharge to group home with followup at her regular outpatient providers  Waylan Boga, [place] 09/18/2014 12:45 PM Patient seen face-to-face for psychiatric evaluation, chart reviewed and case discussed with the physician extender and developed treatment plan. Reviewed the information documented and agree with the treatment plan. Corena Pilgrim, MD

## 2014-09-18 NOTE — Progress Notes (Addendum)
Per psychiatrist, patient psychiatrically stable for discharge home. IVC has been rescinded. Pt requested actt team to pick up patient. CSW spoke with [facility], [phone],  who plan to have someone pick up patient later today and will call back with time.   Olga CoasterKristen Sayre Witherington, [name]  Clinical Social Work  [facility] [phone]

## 2014-09-18 NOTE — Progress Notes (Signed)
Per rn patient requesting to discharge with bus pass and not wait for actt team. CSW provided rn with bus pass. Pt plans to follow up with actt team herself.   Olga CoasterKristen Jadelynn Boylan, [name]  Clinical Social Work  [facility] [phone]

## 2014-09-18 NOTE — [facility] Suicide Risk Assessment (Signed)
Suicide Risk Assessment  Discharge Assessment   [facility]   Demographic Factors:  Adolescent or young adult  Total Time spent with patient: 45 minutes  Musculoskeletal: Strength & Muscle Tone: within normal limits Gait & Station: normal Patient leans: N/A  Psychiatric Specialty Exam: Physical Exam  Review of Systems  Constitutional: Negative.   HENT: Negative.   Eyes: Negative.   Respiratory: Negative.   Cardiovascular: Negative.   Gastrointestinal: Negative.   Genitourinary: Negative.   Musculoskeletal: Negative.   Skin: Negative.   Neurological: Negative.   Endo/Heme/Allergies: Negative.   Psychiatric/Behavioral: The patient is nervous/anxious.     Blood pressure 126/75, pulse 102, temperature 98.5 F (36.9 C), temperature source Oral, resp. rate 16, SpO2 95 %.There is no weight on file to calculate BMI.  General Appearance: Casual  Eye Contact::  Good  Speech:  Normal Rate  Volume:  Normal  Mood:  Anxious, mild  Affect:  Congruent  Thought Process:  Coherent  Orientation:  Full (Time, Place, and Person)  Thought Content:  WDL  Suicidal Thoughts:  No  Homicidal Thoughts:  No  Memory:  Immediate;   Good Recent;   Good Remote;   Good  Judgement:  Fair  Insight:  Fair  Psychomotor Activity:  Normal  Concentration:  Good  Recall:  Good  Fund of Knowledge:Fair  Language: Good  Akathisia:  No  Handed:  Right  AIMS (if indicated):     Assets:  Health and safety inspectorinancial Resources/Insurance Housing Leisure Time Physical Health Resilience Social Support  ADL's:  Intact  Cognition: WNL  Sleep:          Has this patient used any form of tobacco in the last 30 days? (Cigarettes, Smokeless Tobacco, Cigars, and/or Pipes) No  Mental Status Per Nursing Assessment::   On Admission:   Delusional  Current Mental Status by Physician: NA  Loss Factors: NA  Historical Factors: NA  Risk Reduction Factors:   Sense of responsibility to family,  Living with another person, especially a relative, Positive social support and Positive therapeutic relationship  Continued Clinical Symptoms:  Anxiety, mild  Cognitive Features That Contribute To Risk:  None    Suicide Risk:  Minimal: No identifiable suicidal ideation.  Patients presenting with no risk factors but with morbid ruminations; may be classified as minimal risk based on the severity of the depressive symptoms  Principal Problem: Schizoaffective disorder-chronic with exacerbation Discharge Diagnoses:  Patient Active Problem List   Diagnosis Date Noted  . Schizoaffective disorder-chronic with exacerbation [F25.8] 02/06/2014    Priority: High  . Aggressive behavior [F60.89] 08/16/2013    Priority: High  . Psychoses [F29]   . 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 Elevated WBC count [D72.829] 04/07/2014  . Noncompliance with medication regimen [Z91.14] 04/07/2014  . Papular rash, generalized [R21] 09/01/2012  . Amenorrhea [N91.2] 04/25/2012  . ASC-cannot exclude HGSIL on Pap [R89.6] 02/15/2012  . Screening for malignant neoplasm of the cervix [Z12.4] 02/01/2012  . 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 Obesity, unspecified [E66.9] 02/10/2009  . TOBACCO USER [Z72.0] 02/08/2009  . DIABETES MELLITUS [E11.9] 04/02/2008  . CERVICITIS, GONOCOCCAL, History of [A54.03] 01/09/2007  . TRICHOMONAL VAGINITIS [A59.01] 01/09/2007  . ECZEMA, ATOPIC DERMATITIS [L20.9] 06/30/2006      Plan Of Care/Follow-up recommendations:  Activity:  as tolerated Diet:  heart healthy diet  Is patient on multiple antipsychotic therapies at discharge:  No   Has Patient had three or more failed trials of antipsychotic monotherapy by  history:  No  Recommended Plan for Multiple Antipsychotic Therapies: NA    LORD, JAMISON, PMH-NP 09/18/2014, 12:50 PM

## 2014-09-18 NOTE — ED Notes (Addendum)
Pt requested to leave, RN spoke to SW who indicated that the pt was no longer under IVC and her own guardian.  Pt was free to leave.  Pt given blue paper scrubs to leave in due to her arrival clothing being wet.  Bus pass given to pt.

## 2014-09-25 DIAGNOSIS — L309 Dermatitis, unspecified: Secondary | ICD-10-CM | POA: Diagnosis not present

## 2014-09-25 DIAGNOSIS — Z720 Tobacco use: Secondary | ICD-10-CM | POA: Diagnosis not present

## 2014-09-25 DIAGNOSIS — F250 Schizoaffective disorder, bipolar type: Secondary | ICD-10-CM | POA: Diagnosis not present

## 2014-09-25 DIAGNOSIS — E669 Obesity, unspecified: Secondary | ICD-10-CM | POA: Diagnosis not present

## 2014-09-25 DIAGNOSIS — R0602 Shortness of breath: Secondary | ICD-10-CM | POA: Diagnosis not present

## 2014-09-25 DIAGNOSIS — R7309 Other abnormal glucose: Secondary | ICD-10-CM | POA: Diagnosis not present

## 2014-10-03 DIAGNOSIS — B9689 Other specified bacterial agents as the cause of diseases classified elsewhere: Secondary | ICD-10-CM | POA: Diagnosis not present

## 2014-10-03 DIAGNOSIS — N760 Acute vaginitis: Secondary | ICD-10-CM | POA: Diagnosis not present

## 2014-10-03 DIAGNOSIS — R10814 Left lower quadrant abdominal tenderness: Secondary | ICD-10-CM | POA: Diagnosis not present

## 2014-10-03 DIAGNOSIS — K2970 Gastritis, unspecified, without bleeding: Secondary | ICD-10-CM | POA: Diagnosis not present

## 2014-10-30 DIAGNOSIS — Z888 Allergy status to other drugs, medicaments and biological substances status: Secondary | ICD-10-CM | POA: Diagnosis not present

## 2014-10-30 DIAGNOSIS — Z79899 Other long term (current) drug therapy: Secondary | ICD-10-CM | POA: Diagnosis not present

## 2014-10-30 DIAGNOSIS — F17210 Nicotine dependence, cigarettes, uncomplicated: Secondary | ICD-10-CM | POA: Diagnosis not present

## 2014-10-30 DIAGNOSIS — F209 Schizophrenia, unspecified: Secondary | ICD-10-CM | POA: Diagnosis not present

## 2014-10-30 DIAGNOSIS — R4689 Other symptoms and signs involving appearance and behavior: Secondary | ICD-10-CM | POA: Diagnosis not present

## 2014-11-02 DIAGNOSIS — M79602 Pain in left arm: Secondary | ICD-10-CM | POA: Diagnosis not present

## 2014-11-02 DIAGNOSIS — L02412 Cutaneous abscess of left axilla: Secondary | ICD-10-CM | POA: Diagnosis not present

## 2014-11-02 DIAGNOSIS — R6889 Other general symptoms and signs: Secondary | ICD-10-CM | POA: Diagnosis not present

## 2014-11-19 ENCOUNTER — Inpatient Hospital Stay (HOSPITAL_COMMUNITY)
Admission: AD | Admit: 2014-11-19 | Discharge: 2014-11-19 | Discharge status: Against Medical Advice | Payer: Medicare Other | Source: Ambulatory Visit | Attending: Family Medicine | Admitting: Family Medicine

## 2014-11-19 ENCOUNTER — Encounter (HOSPITAL_COMMUNITY): Payer: Self-pay | Admitting: *Deleted

## 2014-11-19 DIAGNOSIS — Z3202 Encounter for pregnancy test, result negative: Secondary | ICD-10-CM | POA: Insufficient documentation

## 2014-11-19 DIAGNOSIS — R319 Hematuria, unspecified: Secondary | ICD-10-CM | POA: Diagnosis not present

## 2014-11-19 DIAGNOSIS — F17210 Nicotine dependence, cigarettes, uncomplicated: Secondary | ICD-10-CM | POA: Insufficient documentation

## 2014-11-19 DIAGNOSIS — N939 Abnormal uterine and vaginal bleeding, unspecified: Secondary | ICD-10-CM | POA: Diagnosis present

## 2014-11-19 DIAGNOSIS — O2690 Pregnancy related conditions, unspecified, unspecified trimester: Secondary | ICD-10-CM | POA: Diagnosis not present

## 2014-11-19 LAB — URINE MICROSCOPIC-ADD ON

## 2014-11-19 LAB — URINALYSIS, ROUTINE W REFLEX MICROSCOPIC
Glucose, UA: NEGATIVE mg/dL
Ketones, ur: NEGATIVE mg/dL
NITRITE: NEGATIVE
Protein, ur: 100 mg/dL — AB
UROBILINOGEN UA: 1 mg/dL (ref 0.0–1.0)
pH: 6 (ref 5.0–8.0)

## 2014-11-19 LAB — POCT PREGNANCY, URINE: PREG TEST UR: NEGATIVE

## 2014-11-19 NOTE — MAU Note (Signed)
Patient presents with possible pregnancy via EMS with c/o noticing blood when she voids. Denies pain or discharge.

## 2014-11-19 NOTE — MAU Provider Note (Signed)
History     CSN: [id number]  Arrival date and time: 11/19/14 4098   None     Chief Complaint  Patient presents with  . Vaginal Bleeding   HPI  This is a 29 y.o. female who presents via EMS with initial complaints of seeing blood when she urinated at home. She told RN that she believes she is pregnant. Denied any pain to RN.  When I went in to evaluate her, she would not speak to me. I encouraged her to let me get her history and do an exam. She refused. States "I will go somewhere else to see about my baby" and "I just need a bus pass".  I informed her that her pregnancy test is negative. She just closed her eyes to that statement   When I offered to care for her, offered to do a blood pregnancy test, she refused. States again, "I just want a bus pass".  Sat up and started to get dressed. I asked if she had pain or other problems, she refused to answer.  Walked out to desk and asked nurse for a bus pass.   Patient presents with possible pregnancy via EMS with c/o noticing blood when she voids. Denies pain or discharge.          OB History    Gravida Para Term Preterm AB TAB SAB Ectopic Multiple Living   Past Medical History  Diagnosis Date  . Schizophrenia   . CERVICITIS, GONOCOCCAL, History of 01/09/2007    Qualifier: History of  By: McDiarmid MD, Tawanna Cooler    . ATTENTION DEFICIT, W/O HYPERACTIVITY, History of 06/30/2006    Qualifier: History of  By: McDiarmid MD, Tawanna Cooler    . Depression   . 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 Dennie Bible (Psych) At [facility] in  [place], [place]. Qualifier: Hospitalized for  By: McDiarmid MD, Tawanna Cooler    . 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 TOBACCO USER 02/08/2009    Qualifier: Diagnosis of  By: Knox Royalty    . 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  ASC-cannot exclude HGSIL on Pap 02/15/2012    ASC-US on 02/03/2012 pap (associated Trichomonas infection). No reflex HPV testing performed on specimen.  Patient informed that she will need repeat Pap in one year.      . Diabetes mellitus     diet controlled  . Eczema   . Abnormal Pap smear     Past Surgical History  Procedure Laterality Date  . Incision and drainage      pilanodal cyst    Family History  Problem Relation Age of Onset  . Adopted: Yes  . Bipolar disorder Sister   . Alcohol abuse Brother   . Cancer Father   . Diabetes Mother     History  Substance Use Topics  . Smoking status: Current Every Day Smoker -- 0.50 packs/day for 10 years    Types: Cigarettes  . Smokeless tobacco: Never Used  . Alcohol Use: Yes     Comment: occ    Allergies:  Allergies  Allergen Reactions  . Abilify [Aripiprazole] Other (See Comments)    Thinks its nasty- does not want it.  Injection is ok.  Prescriptions prior to admission  Medication Sig Dispense Refill Last Dose  . acetaminophen (TYLENOL) 500 MG tablet Take 500 mg by mouth every 6 (six) hours as needed for headache (headache).   Past Month at Unknown time  . benztropine (COGENTIN) 0.5 MG tablet Take 1 tablet (0.5 mg total) by mouth 2 (two) times daily. (Patient not taking: Reported on 07/10/2014) 60 tablet 0 Past Month at Unknown time  . benztropine mesylate (COGENTIN) 1 MG/ML injection Inject 0.5 mLs (0.5 mg total) into the muscle every 30 (thirty) days. First dose received on 04/16/15 with next dose due in thirty days. 2 mL 0 unsure  . divalproex (DEPAKOTE ER) 250 MG 24 hr tablet Take 3 tablets (750 mg total) by mouth 2 (two) times daily. (Patient not taking: Reported on 08/06/2014) 180 tablet 0   . divalproex (DEPAKOTE) 500 MG DR tablet Take 1,000 mg by mouth 2 (two) times daily.  0 Past Month at Unknown time  . folic acid (FOLVITE) 1 MG tablet Take 4 mg by mouth daily.    Past Month at Unknown time  . haloperidol decanoate  (HALDOL DECANOATE) 100 MG/ML injection Inject 1 mL (100 mg total) into the muscle every 30 (thirty) days. (Patient not taking: Reported on 05/02/2014) 1 mL 0 Not Taking at Unknown time  . nitrofurantoin, macrocrystal-monohydrate, (MACROBID) 100 MG capsule Take 1 capsule (100 mg total) by mouth 2 (two) times daily. (Patient not taking: Reported on 08/23/2014) 10 capsule 0   . OLANZapine (ZYPREXA) 20 MG tablet Take 20 mg by mouth 2 (two) times daily.  0 Past Month at Unknown time  . traZODone (DESYREL) 100 MG tablet Take 1 tablet (100 mg total) by mouth at bedtime. (Patient taking differently: Take 100 mg by mouth daily. ) 30 tablet 0 Past Month at Unknown time    Review of Systems  Unable to perform ROS: psychiatric disorder   Physical Exam   Blood pressure 117/71, pulse 93, temperature 98 F (36.7 C), temperature source Oral, resp. rate 20, height 5\' 7"  (1.702 m), weight 320 lb (145.151 kg), last menstrual period 08/02/2014.  Physical Exam  Nursing note and vitals reviewed. Constitutional: She appears well-developed and well-nourished. No distress.  Refused physical exam  Psychiatric: Her mood appears not anxious. Her affect is blunt. She is not withdrawn. She is communicative (Refused to answer most questions).    MAU Course  Procedures  MDM   Assessment and Plan  A:  Presented for c/o seeing blood in urine       Negative pregnancy test  P:  Refused assessment or care       [facility] A[facility] being [facility],Onesti Bonfiglio 11/19/2014, 10:30 AM

## 2014-12-07 ENCOUNTER — Emergency Department (HOSPITAL_COMMUNITY)
Admission: EM | Admit: 2014-12-07 | Discharge: 2014-12-07 | Disposition: A | Discharge status: Home / Self Care | Payer: Medicare Other | Attending: Emergency Medicine | Admitting: Emergency Medicine

## 2014-12-07 DIAGNOSIS — X58XXXA Exposure to other specified factors, initial encounter: Secondary | ICD-10-CM | POA: Diagnosis not present

## 2014-12-07 DIAGNOSIS — Y939 Activity, unspecified: Secondary | ICD-10-CM | POA: Diagnosis not present

## 2014-12-07 DIAGNOSIS — F209 Schizophrenia, unspecified: Secondary | ICD-10-CM | POA: Insufficient documentation

## 2014-12-07 DIAGNOSIS — Y999 Unspecified external cause status: Secondary | ICD-10-CM | POA: Insufficient documentation

## 2014-12-07 DIAGNOSIS — Y929 Unspecified place or not applicable: Secondary | ICD-10-CM | POA: Insufficient documentation

## 2014-12-07 DIAGNOSIS — Z590 Homelessness: Secondary | ICD-10-CM | POA: Insufficient documentation

## 2014-12-07 DIAGNOSIS — T43591A Poisoning by other antipsychotics and neuroleptics, accidental (unintentional), initial encounter: Secondary | ICD-10-CM | POA: Diagnosis present

## 2014-12-07 LAB — COMPREHENSIVE METABOLIC PANEL
ALBUMIN: 3.7 g/dL (ref 3.5–5.0)
ALT: 20 U/L (ref 14–54)
AST: 31 U/L (ref 15–41)
Alkaline Phosphatase: 80 U/L (ref 38–126)
Anion gap: 8 (ref 5–15)
BILIRUBIN TOTAL: 0.8 mg/dL (ref 0.3–1.2)
BUN: 8 mg/dL (ref 6–20)
CO2: 23 mmol/L (ref 22–32)
Calcium: 8.9 mg/dL (ref 8.9–10.3)
Chloride: 103 mmol/L (ref 101–111)
Creatinine, Ser: 0.86 mg/dL (ref 0.44–1.00)
GFR calc Af Amer: 59 mL/min — ABNORMAL LOW (ref >60)
GFR calc non Af Amer: 51 mL/min — ABNORMAL LOW (ref >60)
Glucose, Bld: 97 mg/dL (ref 65–99)
Potassium: 3.9 mmol/L (ref 3.5–5.1)
Sodium: 134 mmol/L — ABNORMAL LOW (ref 135–145)
Total Protein: 7.2 g/dL (ref 6.5–8.1)

## 2014-12-07 LAB — CBC WITH DIFFERENTIAL/PLATELET
BASOS PCT: 0 % (ref 0–1)
Basophils Absolute: 0 10*3/uL (ref 0.0–0.1)
EOS ABS: 0.1 10*3/uL (ref 0.0–0.7)
Eosinophils Relative: 2 % (ref 0–5)
HEMATOCRIT: 38.4 % (ref 36.0–46.0)
Hemoglobin: 13 g/dL (ref 12.0–15.0)
LYMPHS ABS: 2.8 10*3/uL (ref 0.7–4.0)
Lymphocytes Relative: 32 % (ref 12–46)
MCH: 29.6 pg (ref 26.0–34.0)
MCHC: 33.9 g/dL (ref 30.0–36.0)
MCV: 87.5 fL (ref 78.0–100.0)
MONOS PCT: 8 % (ref 3–12)
Monocytes Absolute: 0.7 10*3/uL (ref 0.1–1.0)
NEUTROS PCT: 58 % (ref 43–77)
Neutro Abs: 5.1 10*3/uL (ref 1.7–7.7)
Platelets: 305 10*3/uL (ref 150–400)
RBC: 4.39 MIL/uL (ref 3.87–5.11)
RDW: 15.4 % (ref 11.5–15.5)
WBC: 8.8 10*3/uL (ref 4.0–10.5)

## 2014-12-07 LAB — ETHANOL: Alcohol, Ethyl (B): <5 mg/dL (ref <5)

## 2014-12-07 LAB — RAPID URINE DRUG SCREEN, HOSP PERFORMED
Amphetamines: NOT DETECTED
BARBITURATES: NOT DETECTED
Benzodiazepines: NOT DETECTED
Cocaine: NOT DETECTED
Opiates: NOT DETECTED
TETRAHYDROCANNABINOL: NOT DETECTED

## 2014-12-07 LAB — URINALYSIS, ROUTINE W REFLEX MICROSCOPIC
Glucose, UA: NEGATIVE mg/dL
KETONES UR: NEGATIVE mg/dL
Nitrite: NEGATIVE
PH: 6 (ref 5.0–8.0)
Protein, ur: NEGATIVE mg/dL
SPECIFIC GRAVITY, URINE: 1.026 (ref 1.005–1.030)
UROBILINOGEN UA: 1 mg/dL (ref 0.0–1.0)

## 2014-12-07 LAB — PREGNANCY, URINE: Preg Test, Ur: NEGATIVE

## 2014-12-07 LAB — URINE MICROSCOPIC-ADD ON

## 2014-12-07 LAB — SALICYLATE LEVEL

## 2014-12-07 LAB — ACETAMINOPHEN LEVEL

## 2014-12-07 MED ORDER — AMMONIA AROMATIC IN INHA
RESPIRATORY_TRACT | Status: AC
  Filled 2014-12-07: qty 10

## 2014-12-07 MED ORDER — SODIUM CHLORIDE 0.9 % IV BOLUS (SEPSIS)
1000.0000 mL | Freq: Once | INTRAVENOUS | Status: AC
  Administered 2014-12-07: 1000 mL via INTRAVENOUS

## 2014-12-07 NOTE — ED Notes (Signed)
Pt. Found at four [facility] by hotel manager when there was no response at door. EMS noted only risperdone (approx 20-30 packets) and ETOH containers on scene. Pt. Responsive to pain. Pt. Does protect eyes when checking pupils. Airway intact.

## 2014-12-07 NOTE — ED Notes (Signed)
Pt. Left without d/c paperwork. Pt. Ambulatory at d/c, given blue scrubs. Pt. Took belongings including necklace and clothing that was cut off with her. Pt. Given bus pass to return to shelter. [employer] officer escorted pt. To bus stop.

## 2014-12-07 NOTE — ED Notes (Signed)
Pt. Awake and alert, responding to speech and responding to questions. Pt. Breathing even and unlabored. Pt. Reoriented to situation.

## 2014-12-07 NOTE — ED Provider Notes (Signed)
CSN: [id number]     Arrival date & time 12/07/14  1250 History   First MD Initiated Contact with Patient 12/07/14 1251     Chief Complaint  Patient presents with  . Drug Overdose     (Consider location/radiation/quality/duration/timing/severity/associated sxs/prior Treatment) HPI Patient was found at her hotel by the hotel manager not responding to questions.  EMS was called.  They state she is responsive to pain.  On arrival the patient will hold her eyes closed tightly you attempt to open them.  She was found with risperidone tablets around her.  There are 20-30 packets but only 2 or 3 of them were opened.  EMS reports alcohol on scene.  Were able to find her demographic information and reviewed that chart demonstrates a history of schizophrenia.  Initially the patient remained responsive only to pain however while in the emergency department she began speaking.  After a while she began to only say that she was homeless and had nowhere to stay.  She is requesting information on a shelter.  She is requesting a sandwich.  She has no complaints.   No past medical history on file. No past surgical history on file. No family history on file. History  Substance Use Topics  . Smoking status: Not on file  . Smokeless tobacco: Not on file  . Alcohol Use: Not on file   OB History    No data available     Review of Systems  All other systems reviewed and are negative.     Allergies  Review of patient's allergies indicates not on file.  Home Medications   Prior to Admission medications   Not on File   BP 143/87 mmHg  Pulse 78  Resp 15  SpO2 100% Physical Exam  Constitutional: She appears well-developed and well-nourished. No distress.  HENT:  Head: Normocephalic and atraumatic.  Eyes: EOM are normal.  Neck: Normal range of motion.  Cardiovascular: Normal rate, regular rhythm and normal heart sounds.   Pulmonary/Chest: Effort normal and breath sounds normal.  Abdominal: Soft.  She exhibits no distension. There is no tenderness.  Musculoskeletal: Normal range of motion.  Neurological:  Opens eyes to pain.  Moves all 4 extremities equally.  Skin: Skin is warm and dry.  Psychiatric: She has a normal mood and affect. Judgment normal.  Nursing note and vitals reviewed.   ED Course  Procedures (including critical care time) Labs Review Labs Reviewed  COMPREHENSIVE METABOLIC PANEL - Abnormal; Notable for the following:    Sodium 134 (*)    GFR calc non Af Amer 51 (*)    GFR calc Af Amer 59 (*)    All other components within normal limits  URINALYSIS, ROUTINE W REFLEX MICROSCOPIC (NOT AT [facility]) - Abnormal; Notable for the following:    Color, Urine AMBER (*)    APPearance CLOUDY (*)    Hgb urine dipstick SMALL (*)    Bilirubin Urine SMALL (*)    Leukocytes, UA SMALL (*)    All other components within normal limits  ACETAMINOPHEN LEVEL - Abnormal; Notable for the following:    Acetaminophen (Tylenol), Serum <10 (*)    All other components within normal limits  URINE MICROSCOPIC-ADD ON - Abnormal; Notable for the following:    Squamous Epithelial / LPF FEW (*)    All other components within normal limits  CBC WITH DIFFERENTIAL/PLATELET  ETHANOL  URINE RAPID DRUG SCREEN, HOSP PERFORMED  PREGNANCY, URINE  SALICYLATE LEVEL    Imaging Review  No results found.   EKG Interpretation   Date/Time:  Saturday December 07 2014 13:02:29 EDT Ventricular Rate:  78 PR Interval:  150 QRS Duration: 74 QT Interval:  388 QTC Calculation: 442 R Axis:   -2 Text Interpretation:  Sinus rhythm No old tracing to compare Confirmed by  Neveyah Garzon  MD, Caryn Bee ([id number]) on 12/08/2014 7:51:26 AM      MDM   Final diagnoses:  Schizophrenia, unspecified type    Patient was seen and evaluated in the emergency department.  We observed her for several hours.  While in the emergency department her mental status normalized.  I doubt this is a drug overdose.  This seems to be more mental  health related.  The patient is not homicidal or suicidal.  She has no complaints at this time.  She states she is compliant with her medications.  She reports she is just hungry and has nowhere to stay.  She was given a bus pass and information for the local shelters    Azalia Bilis, MD 12/08/14 [phone]

## 2014-12-09 ENCOUNTER — Encounter (HOSPITAL_COMMUNITY): Payer: Self-pay

## 2014-12-09 ENCOUNTER — Emergency Department (HOSPITAL_COMMUNITY)
Admission: EM | Admit: 2014-12-09 | Discharge: 2014-12-13 | Disposition: A | Discharge status: Home / Self Care | Payer: Medicare Other | Attending: Emergency Medicine | Admitting: Emergency Medicine

## 2014-12-09 DIAGNOSIS — Z79899 Other long term (current) drug therapy: Secondary | ICD-10-CM | POA: Insufficient documentation

## 2014-12-09 DIAGNOSIS — Z87891 Personal history of nicotine dependence: Secondary | ICD-10-CM | POA: Insufficient documentation

## 2014-12-09 DIAGNOSIS — F258 Other schizoaffective disorders: Secondary | ICD-10-CM | POA: Diagnosis not present

## 2014-12-09 DIAGNOSIS — Z8619 Personal history of other infectious and parasitic diseases: Secondary | ICD-10-CM | POA: Diagnosis not present

## 2014-12-09 DIAGNOSIS — F99 Mental disorder, not otherwise specified: Secondary | ICD-10-CM | POA: Diagnosis not present

## 2014-12-09 DIAGNOSIS — F329 Major depressive disorder, single episode, unspecified: Secondary | ICD-10-CM | POA: Diagnosis not present

## 2014-12-09 DIAGNOSIS — E119 Type 2 diabetes mellitus without complications: Secondary | ICD-10-CM | POA: Diagnosis not present

## 2014-12-09 DIAGNOSIS — Z008 Encounter for other general examination: Secondary | ICD-10-CM | POA: Diagnosis present

## 2014-12-09 DIAGNOSIS — Z872 Personal history of diseases of the skin and subcutaneous tissue: Secondary | ICD-10-CM | POA: Insufficient documentation

## 2014-12-09 DIAGNOSIS — F23 Brief psychotic disorder: Secondary | ICD-10-CM | POA: Diagnosis not present

## 2014-12-09 DIAGNOSIS — F259 Schizoaffective disorder, unspecified: Secondary | ICD-10-CM | POA: Insufficient documentation

## 2014-12-09 LAB — CBC
HCT: 36.9 % (ref 36.0–46.0)
Hemoglobin: 12.3 g/dL (ref 12.0–15.0)
MCH: 29.4 pg (ref 26.0–34.0)
MCHC: 33.3 g/dL (ref 30.0–36.0)
MCV: 88.1 fL (ref 78.0–100.0)
Platelets: 311 10*3/uL (ref 150–400)
RBC: 4.19 MIL/uL (ref 3.87–5.11)
RDW: 15.3 % (ref 11.5–15.5)
WBC: 11.7 10*3/uL — AB (ref 4.0–10.5)

## 2014-12-09 LAB — HCG, QUANTITATIVE, PREGNANCY: hCG, Beta Chain, Quant, S: <1 m[IU]/mL (ref <5)

## 2014-12-09 LAB — COMPREHENSIVE METABOLIC PANEL
ALT: 19 U/L (ref 14–54)
AST: 28 U/L (ref 15–41)
Albumin: 3.6 g/dL (ref 3.5–5.0)
Alkaline Phosphatase: 76 U/L (ref 38–126)
Anion gap: 9 (ref 5–15)
BILIRUBIN TOTAL: 0.3 mg/dL (ref 0.3–1.2)
BUN: 7 mg/dL (ref 6–20)
CALCIUM: 9.1 mg/dL (ref 8.9–10.3)
CHLORIDE: 106 mmol/L (ref 101–111)
CO2: 25 mmol/L (ref 22–32)
Creatinine, Ser: 0.88 mg/dL (ref 0.44–1.00)
Glucose, Bld: 97 mg/dL (ref 65–99)
POTASSIUM: 3.5 mmol/L (ref 3.5–5.1)
Sodium: 140 mmol/L (ref 135–145)
TOTAL PROTEIN: 7.3 g/dL (ref 6.5–8.1)

## 2014-12-09 LAB — ETHANOL

## 2014-12-09 MED ORDER — DIPHENHYDRAMINE HCL 50 MG/ML IJ SOLN
INTRAMUSCULAR | Status: AC
  Administered 2014-12-09: 50 mg
  Filled 2014-12-09: qty 1

## 2014-12-09 MED ORDER — DIPHENHYDRAMINE HCL 50 MG/ML IJ SOLN: 50.0000 mg | Freq: Once | INTRAMUSCULAR | Status: DC

## 2014-12-09 MED ORDER — ZIPRASIDONE MESYLATE 20 MG IM SOLR
10.0000 mg | Freq: Once | INTRAMUSCULAR | Status: AC
  Administered 2014-12-09: 10 mg via INTRAMUSCULAR
  Filled 2014-12-09: qty 20

## 2014-12-09 NOTE — ED Notes (Signed)
Pt advised that lab work would be required and the she had been IVC'd.  Pt states she is fine and nothing is wrong with her.  Pt advised that the psychiatrist would see her and determine if she could leave
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
## 2014-12-09 NOTE — ED Provider Notes (Signed)
CSN: [id number]     Arrival date & time 12/09/14  1501 History   First MD Initiated Contact with Patient 12/09/14 1637     No chief complaint on file.    (Consider location/radiation/quality/duration/timing/severity/associated sxs/prior Treatment) HPI Is brought in by EMS for wandering in traffic. The patient states that she came to the hospital just to get some prenatal vitamins. When asked why she was brought in by EMS, she reports that her brother called the police. She denies that she lives with her brother. She does not state that she has any home. She states that she needs to get her credit card taken care of and then she'll have some where to go. She explains that she just "saw her brother in the street and he call EMS." Apparently he had no reason for calling per the patient. There is no family member here to explain the events surrounding the patient's arrival. Past Medical History  Diagnosis Date  . Schizophrenia   . CERVICITIS, GONOCOCCAL, History of 01/09/2007    Qualifier: History of  By: McDiarmid MD, Tawanna Cooler    . ATTENTION DEFICIT, W/O HYPERACTIVITY, History of 06/30/2006    Qualifier: History of  By: McDiarmid MD, Tawanna Cooler    . Depression   . 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 Dennie Bible (Psych) At [facility] in  [place], [place]. Qualifier: Hospitalized for  By: McDiarmid MD, Tawanna Cooler    . 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 TOBACCO USER 02/08/2009    Qualifier: Diagnosis of  By: Knox Royalty    . 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 ASC-cannot exclude HGSIL on Pap 02/15/2012    ASC-US on 02/03/2012 pap (associated Trichomonas infection). No reflex HPV testing performed on specimen.  Patient informed that she will need repeat Pap in one year.      . Diabetes mellitus     diet controlled  . Eczema   . Abnormal Pap  smear    Past Surgical History  Procedure Laterality Date  . Incision and drainage      pilanodal cyst   Family History  Problem Relation Age of Onset  . Adopted: Yes  . Bipolar disorder Sister   . Alcohol abuse Brother   . Cancer Father   . Diabetes Mother    History  Substance Use Topics  . Smoking status: Former Smoker -- 0.50 packs/day for 10 years    Types: Cigarettes  . Smokeless tobacco: Never Used  . Alcohol Use: No     Comment: occ   OB History    Gravida Para Term Preterm AB TAB SAB Ectopic Multiple Living   2 1 1       1      Review of Systems 10 Systems reviewed and are negative for acute change except as noted in the HPI. Level V caveat, the patient is mentally disorganized at this time.   Allergies  Abilify  Home Medications   Prior to Admission medications   Medication Sig Start Date End Date Taking? Authorizing Provider  acetaminophen (TYLENOL) 500 MG tablet Take 500 mg by mouth every 6 (six) hours as needed for headache (headache).    Historical Provider, MD  benztropine (COGENTIN) 0.5 MG tablet Take 1 tablet (0.5 mg total) by mouth 2 (two) times daily. 04/17/14  Thermon Leyland, NP  benztropine mesylate (COGENTIN) 1 MG/ML injection Inject 0.5 mLs (0.5 mg total) into the muscle every 30 (thirty) days. First dose received on 04/16/15 with next dose due in thirty days. 05/16/14   Thermon Leyland, NP  divalproex (DEPAKOTE ER) 250 MG 24 hr tablet Take 3 tablets (750 mg total) by mouth 2 (two) times daily. 04/17/14   Thermon Leyland, NP  divalproex (DEPAKOTE) 500 MG DR tablet Take 1,000 mg by mouth 2 (two) times daily. 07/22/14   Historical Provider, MD  folic acid (FOLVITE) 1 MG tablet Take 4 mg by mouth daily.     Historical Provider, MD  haloperidol decanoate (HALDOL DECANOATE) 100 MG/ML injection Inject 1 mL (100 mg total) into the muscle every 30 (thirty) days. Patient not taking: Reported on 05/02/2014 05/16/14   Thermon Leyland, NP  nitrofurantoin,  macrocrystal-monohydrate, (MACROBID) 100 MG capsule Take 1 capsule (100 mg total) by mouth 2 (two) times daily. Patient not taking: Reported on 08/23/2014 08/06/14   Charlestine Night, PA-C  OLANZapine (ZYPREXA) 20 MG tablet Take 20 mg by mouth 2 (two) times daily. 07/22/14   Historical Provider, MD  traZODone (DESYREL) 100 MG tablet Take 1 tablet (100 mg total) by mouth at bedtime. Patient taking differently: Take 100 mg by mouth daily.  04/17/14   Thermon Leyland, NP   BP 129/80 mmHg  Pulse 109  Temp(Src) 98.1 F (36.7 C) (Oral)  Resp 20  SpO2 99%  LMP 08/02/2014 Physical Exam  Constitutional:  The patient is alert and hostile in appearance. She has no respiratory distress. Patient is wearing urine-soaked garments. She is nontoxic.  HENT:  Head: Normocephalic and atraumatic.  Nose: Nose normal.  Eyes: Conjunctivae and EOM are normal. Pupils are equal, round, and reactive to light.  Neck: Neck supple.  Cardiovascular: Normal rate, regular rhythm, normal heart sounds and intact distal pulses.   Pulmonary/Chest: Effort normal and breath sounds normal.  Abdominal: Soft. She exhibits no distension. There is no tenderness.  Musculoskeletal: Normal range of motion. She exhibits no edema.  Patient is ambulatory with symmetric, nonantalgic gait.  Neurological: She is alert. No cranial nerve deficit. She exhibits normal muscle tone. Coordination normal.  Patient does not specifically answer questions for time and place.  Skin: Skin is warm and dry.  Psychiatric:  The patient is hostile and poorly cooperative. She intermittently will provide historical answers and at other times is withdrawn and does not answer. She has a hostile gaze when interacting.    ED Course  Procedures (including critical care time) Labs Review Labs Reviewed  COMPREHENSIVE METABOLIC PANEL  ETHANOL  CBC  URINE RAPID DRUG SCREEN, HOSP PERFORMED  I-STAT BETA HCG BLOOD, ED ([facility], [facility], [facility] ONLY)    Imaging Review No  results found.   EKG Interpretation None      MDM   Final diagnoses:  Disorganized schizophrenia   At this time patient is having very disorganized thoughts. She was found walking in the middle of the street and was withdrawn and noncommunicative. Her communications with may have been very hostile or withdrawn. Her thoughts are disorganized citing pregnancy and insistence on having prenatal vitamins although her pregnancy test was -2 days ago. Review of the medical records indicates that she has previously presented to [facility] stating she is pregnant and then having negative pregnancy test. Patient reports it was her brother who called EMS, at this time it is unclear if her brother was involved or not. No  other family members or immediately bystanders are present. At this time I did feel the patient needed IVC and psychiatric evaluation for her own safety.    Arby Barrette, MD 12/09/14 1800

## 2014-12-09 NOTE — ED Notes (Signed)
Per EMS- pt was walking in the middle of [employer]. Wasn't communicating with [employer]-noted she urinated on herself. Reported she is pregnant (one month). VS: 122/62 CBG 156 mg/dl. HR 110 RR 16.
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
## 2014-12-09 NOTE — ED Notes (Signed)
Patient states she does not need any evaluation for psychiatric problems. "All I need is pregnancy vitamins."

## 2014-12-09 NOTE — ED Notes (Addendum)
MD at bedside at this time. Prior to MD coming to room, pt was at nursing station stating that she wanted some medication. Made pt aware that she cannot receive medication without a doctors order. Pt wanted to know where the doctor was and began walking down the hallway towards the nursing station and talking to an admitting physician that she saw about an OBGYN referral. Pt being followed by [employer] and security and ended up in ambulance bay where patient was eventually redirected to her room. MD stating that she is now going to IVC patient.

## 2014-12-09 NOTE — ED Notes (Signed)
Unable to collect labs patient refused.  I made nurse aware 

## 2014-12-09 NOTE — BHH Counselor (Signed)
(  per [employer]) pt brother is Jaicee Michelotti, phone number: [phone]  Kateri Plummer, M.S., LPCA, Bridget Hartshorn, [facility] Licensed Professional Counselor Associate  Triage Specialist  [facility]  Therapeutic Triage Services Phone: [phone] Fax: [phone]

## 2014-12-09 NOTE — ED Notes (Signed)
Patient denies SI/HI, visual or auditory hallucinations. 

## 2014-12-09 NOTE — ED Notes (Signed)
I attempted to collect labs and was unsuccessful.  I spoke with Rodney Booze in the main lab and she is coming up to try and collect them.

## 2014-12-09 NOTE — ED Notes (Signed)
Tasha from the lab obtained results.  A note of thanks to the [facility] who helped keep patient calm.  Pt advised of need to get urine.  Pt acknowledged request stated she didn't have to go right now.

## 2014-12-10 DIAGNOSIS — F259 Schizoaffective disorder, unspecified: Secondary | ICD-10-CM | POA: Diagnosis not present

## 2014-12-10 DIAGNOSIS — F258 Other schizoaffective disorders: Secondary | ICD-10-CM

## 2014-12-10 DIAGNOSIS — F201 Disorganized schizophrenia: Secondary | ICD-10-CM | POA: Insufficient documentation

## 2014-12-10 LAB — URINALYSIS, ROUTINE W REFLEX MICROSCOPIC
BILIRUBIN URINE: NEGATIVE
GLUCOSE, UA: NEGATIVE mg/dL
KETONES UR: NEGATIVE mg/dL
LEUKOCYTES UA: NEGATIVE
NITRITE: NEGATIVE
Protein, ur: NEGATIVE mg/dL
Specific Gravity, Urine: 1.026 (ref 1.005–1.030)
UROBILINOGEN UA: 1 mg/dL (ref 0.0–1.0)
pH: 6.5 (ref 5.0–8.0)

## 2014-12-10 LAB — URINE MICROSCOPIC-ADD ON

## 2014-12-10 LAB — RAPID URINE DRUG SCREEN, HOSP PERFORMED
AMPHETAMINES: NOT DETECTED
BARBITURATES: NOT DETECTED
BENZODIAZEPINES: NOT DETECTED
Cocaine: NOT DETECTED
Opiates: NOT DETECTED
Tetrahydrocannabinol: NOT DETECTED

## 2014-12-10 MED ORDER — OLANZAPINE 10 MG PO TBDP
10.0000 mg | ORAL_TABLET | Freq: Three times a day (TID) | ORAL | Status: DC | PRN
  Filled 2014-12-10: qty 1

## 2014-12-10 MED ORDER — BENZTROPINE MESYLATE 1 MG PO TABS
0.5000 mg | ORAL_TABLET | Freq: Two times a day (BID) | ORAL | Status: DC
  Administered 2014-12-10 – 2014-12-13 (×7): 0.5 mg via ORAL
  Filled 2014-12-10 (×7): qty 1

## 2014-12-10 MED ORDER — TRAZODONE HCL 100 MG PO TABS
100.0000 mg | ORAL_TABLET | Freq: Every day | ORAL | Status: DC
  Administered 2014-12-10 – 2014-12-12 (×3): 100 mg via ORAL
  Filled 2014-12-10 (×3): qty 1

## 2014-12-10 MED ORDER — HALOPERIDOL 5 MG PO TABS
5.0000 mg | ORAL_TABLET | Freq: Two times a day (BID) | ORAL | Status: DC
  Administered 2014-12-10 – 2014-12-13 (×7): 5 mg via ORAL
  Filled 2014-12-10 (×7): qty 1

## 2014-12-10 MED ORDER — NICOTINE 21 MG/24HR TD PT24
21.0000 mg | MEDICATED_PATCH | Freq: Once | TRANSDERMAL | Status: AC
  Administered 2014-12-10 – 2014-12-11 (×2): 21 mg via TRANSDERMAL
  Filled 2014-12-10 (×2): qty 1

## 2014-12-10 MED ORDER — HALOPERIDOL DECANOATE 100 MG/ML IM SOLN
100.0000 mg | INTRAMUSCULAR | Status: DC
  Administered 2014-12-11: 100 mg via INTRAMUSCULAR
  Filled 2014-12-10 (×2): qty 1

## 2014-12-10 MED ORDER — DIVALPROEX SODIUM ER 500 MG PO TB24
750.0000 mg | ORAL_TABLET | Freq: Two times a day (BID) | ORAL | Status: DC
  Administered 2014-12-10 – 2014-12-13 (×6): 750 mg via ORAL
  Filled 2014-12-10 (×11): qty 1

## 2014-12-10 MED ORDER — HALOPERIDOL DECANOATE 100 MG/ML IM SOLN: 100.0000 mg | INTRAMUSCULAR | Status: DC

## 2014-12-10 NOTE — ED Notes (Signed)
Reassessment 12/10/2014:  Could not assess whether she was suicidal, homicidal or hearing or seeing things at this time. Writer asked each question several times but patient stared at this Clinical research associate. Patient would sometimes laugh and smile still not answering. Patient acknowledges that [employer] brought her to the ER but she doesn't understand the reason. Patient reportedly eating and sleeping well.

## 2014-12-10 NOTE — ED Notes (Signed)
Ate half of lunch tray then went to bathroom and coughed forcefully. Eventually vomited up meal contents, likely meds recently taken as well.

## 2014-12-10 NOTE — ED Notes (Signed)
Out to desk, took can of ginger ale off dietary cart. Will not hand can to staff "until she is finished."

## 2014-12-10 NOTE — ED Notes (Signed)
Pt given graham cracker, saline crackers, and ginger ale per request.

## 2014-12-10 NOTE — ED Notes (Signed)
Bed: WA25 Expected date:  Expected time:  Means of arrival:  Comments: Hold for TCU 

## 2014-12-10 NOTE — Progress Notes (Signed)
Pt referred to:  [facility]   [place], [place]  Clinical Social Work  [employer] [phone]

## 2014-12-10 NOTE — Consult Note (Signed)
Molly [name] Psychiatry Consult   Reason for Consult:  Schizoaffective disorder, Psychosis Referring Physician:  EDP Patient Identification: Molly [name] MRN:  [id number] Principal Diagnosis: Schizoaffective disorder-chronic with exacerbation Diagnosis:   Patient Active Problem List   Diagnosis Date Noted  . Schizoaffective disorder-chronic with exacerbation [F25.8] 02/06/2014    Priority: High  . Psychoses [F29]   . 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 Elevated WBC count [D72.829] 04/07/2014  . Noncompliance with medication regimen [Z91.14] 04/07/2014  . Aggressive behavior [F60.89] 08/16/2013  . Papular rash, generalized [R21] 09/01/2012  . Amenorrhea [N91.2] 04/25/2012  . ASC-cannot exclude HGSIL on Pap [R89.6] 02/15/2012  . Screening for malignant neoplasm of the cervix [Z12.4] 02/01/2012  . 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 Obesity, unspecified [E66.9] 02/10/2009  . TOBACCO USER [Z72.0] 02/08/2009  . DIABETES MELLITUS [E11.9] 04/02/2008  . CERVICITIS, GONOCOCCAL, History of [A54.03] 01/09/2007  . TRICHOMONAL VAGINITIS [A59.01] 01/09/2007  . ECZEMA, ATOPIC DERMATITIS [L20.9] 06/30/2006    Total Time spent with patient: 45 minutes  Subjective:   Molly [name] is a 29 y.o. female patient admitted with Schizoaffective disorder, Psychosis.  HPI:  Molly [name] female, 29 years old was evaluated for symptoms of Psychosis.  She has not been compliant with her medications.  Patient is well known to the service and has had previous admissions at [facility] and our [facility].  Patient was brought in by EMS after been found in a hotel room with some of  her medications not opened.  Patient could not answer any questions this morning regarding this visit..  She did not know why she came to the hospital.   Patient was seen naked but needed frequent redirecting to put on her cloth.  Patient has been accepted for admission and we will resume her  Home medications.  We will seek  placement at any facility with available beds.  HPI Elements:   Location:  Schizoaffective disorder, Psychosis. Quality:  severe. Severity:  severe. Timing:  Acute. Duration:  Chronic Mental illness. Context:  Brought in by EMS from a Molly [name] room..  Past Medical History:  Past Medical History  Diagnosis Date  . Schizophrenia   . CERVICITIS, GONOCOCCAL, History of 01/09/2007    Qualifier: History of  By: McDiarmid MD, Sherren Mocha    . ATTENTION DEFICIT, W/O HYPERACTIVITY, History of 06/30/2006    Qualifier: History of  By: McDiarmid MD, Sherren Mocha    . Depression   . 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 Henrene Dodge (Psych) At [facility] in  [place], [place]. Qualifier: Hospitalized for  By: McDiarmid MD, Sherren Mocha    . 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 TOBACCO USER 02/08/2009    Qualifier: Diagnosis of  By: Samara Snide    . 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 ASC-cannot exclude HGSIL on Pap 02/15/2012    ASC-US on 02/03/2012 pap (associated Trichomonas infection). No reflex HPV testing performed on specimen.  Patient informed that she will need repeat Pap in one year.      . Diabetes mellitus     diet controlled  . Eczema   . Abnormal Pap smear     Past Surgical History  Procedure Laterality Date  . Incision and drainage      pilanodal cyst   Family History:  Family History  Problem Relation Age of Onset  . Adopted: Yes  . Bipolar disorder Sister   . Alcohol abuse Brother   . Cancer Father   . Diabetes Mother    Social History:  History  Alcohol Use No    Comment: occ     History  Drug Use No    History   Social History  . Marital Status: Single    Spouse Name: N/A  . Number of Children: N/A  . Years of Education: N/A   Social History Main Topics  . Smoking status: Former Smoker -- 0.50 packs/day for 10  years    Types: Cigarettes  . Smokeless tobacco: Never Used  . Alcohol Use: No     Comment: occ  . Drug Use: No  . Sexual Activity: Yes    Birth Control/ Protection: None   Other Topics Concern  . None   Social History Narrative   Adopted   Living in [place] home with Molly [name]   Transportation: Clinical biochemist Social History:    History of alcohol / drug use?: Yes Name of Substance 1: UTA- per [employer] opiate abuse is suspected                   Allergies:   Allergies  Allergen Reactions  . Abilify [Aripiprazole] Other (See Comments)    Thinks its nasty- does not want it.  Injection is ok.      Labs:  Results for orders placed or performed during the hospital encounter of 12/09/14 (from the past 48 hour(s))  Comprehensive metabolic panel     Status: None   Collection Time: 12/09/14  8:15 PM  Result Value Ref Range   Sodium 140 135 - 145 mmol/L   Potassium 3.5 3.5 - 5.1 mmol/L   Chloride 106 101 - 111 mmol/L   CO2 25 22 - 32 mmol/L   Glucose, Bld 97 65 - 99 mg/dL   BUN 7 6 - 20 mg/dL   Creatinine, Ser 0.88 0.44 - 1.00 mg/dL   Calcium 9.1 8.9 - 10.3 mg/dL   Total Protein 7.3 6.5 - 8.1 g/dL   Albumin 3.6 3.5 - 5.0 g/dL   AST 28 15 - 41 U/L   ALT 19 14 - 54 U/L   Alkaline Phosphatase 76 38 - 126 U/L   Total Bilirubin 0.3 0.3 - 1.2 mg/dL   GFR calc non Af Amer >60 >60 mL/min   GFR calc Af Amer >60 >60 mL/min    Comment: (NOTE) The eGFR has been calculated using the CKD EPI equation. This calculation has not been validated in all clinical situations. eGFR's persistently <60 mL/min signify possible Chronic Kidney Disease.    Anion gap 9 5 - 15  Ethanol (ETOH)     Status: None   Collection Time: 12/09/14  8:15 PM  Result Value Ref Range   Alcohol, Ethyl (B) <5 <5 mg/dL    Comment:        LOWEST DETECTABLE LIMIT FOR SERUM ALCOHOL IS 5 mg/dL FOR MEDICAL PURPOSES ONLY   CBC     Status: Abnormal   Collection Time: 12/09/14  8:15 PM  Result Value Ref  Range   WBC 11.7 (H) 4.0 - 10.5 K/uL   RBC 4.19 3.87 - 5.11 MIL/uL   Hemoglobin 12.3 12.0 - 15.0 g/dL   HCT 36.9 36.0 - 46.0 %   MCV 88.1 78.0 - 100.0 fL   MCH 29.4 26.0 - 34.0 pg  MCHC 33.3 30.0 - 36.0 g/dL   RDW 15.3 11.5 - 15.5 %   Platelets 311 150 - 400 K/uL  hCG, quantitative, pregnancy     Status: None   Collection Time: 12/09/14  8:15 PM  Result Value Ref Range   hCG, Beta Chain, Quant, S <1 <5 mIU/mL    Comment:          GEST. AGE      CONC.  (mIU/mL)   <=1 WEEK        5 - 50     2 WEEKS       50 - 500     3 WEEKS       100 - 10,000     4 WEEKS     1,000 - 30,000     5 WEEKS     3,500 - 115,000   6-8 WEEKS     12,000 - 270,000    12 WEEKS     15,000 - 220,000        FEMALE AND NON-PREGNANT FEMALE:     LESS THAN 5 mIU/mL   Urine rapid drug screen (hosp performed) (Not at [facility])     Status: None   Collection Time: 12/10/14  3:54 AM  Result Value Ref Range   Opiates NONE DETECTED NONE DETECTED   Cocaine NONE DETECTED NONE DETECTED   Benzodiazepines NONE DETECTED NONE DETECTED   Amphetamines NONE DETECTED NONE DETECTED   Tetrahydrocannabinol NONE DETECTED NONE DETECTED   Barbiturates NONE DETECTED NONE DETECTED    Comment:        DRUG SCREEN FOR MEDICAL PURPOSES ONLY.  IF CONFIRMATION IS NEEDED FOR ANY PURPOSE, NOTIFY LAB WITHIN 5 DAYS.        LOWEST DETECTABLE LIMITS FOR URINE DRUG SCREEN Drug Class       Cutoff (ng/mL) Amphetamine      1000 Barbiturate      200 Benzodiazepine   361 Tricyclics       443 Opiates          300 Cocaine          300 THC              50   Urinalysis, Routine w reflex microscopic (not at [facility])     Status: Abnormal   Collection Time: 12/10/14  3:54 AM  Result Value Ref Range   Color, Urine YELLOW YELLOW   APPearance CLOUDY (A) CLEAR   Specific Gravity, Urine 1.026 1.005 - 1.030   pH 6.5 5.0 - 8.0   Glucose, UA NEGATIVE NEGATIVE mg/dL   Hgb urine dipstick TRACE (A) NEGATIVE   Bilirubin Urine NEGATIVE NEGATIVE   Ketones, ur  NEGATIVE NEGATIVE mg/dL   Protein, ur NEGATIVE NEGATIVE mg/dL   Urobilinogen, UA 1.0 0.0 - 1.0 mg/dL   Nitrite NEGATIVE NEGATIVE   Leukocytes, UA NEGATIVE NEGATIVE  Urine microscopic-add on     Status: Abnormal   Collection Time: 12/10/14  3:54 AM  Result Value Ref Range   Squamous Epithelial / LPF FEW (A) RARE   WBC, UA 0-2 <3 WBC/hpf   RBC / HPF 0-2 <3 RBC/hpf   Bacteria, UA FEW (A) RARE   Casts HYALINE CASTS (A) NEGATIVE   Urine-Other MUCOUS PRESENT     Vitals: Blood pressure 98/58, pulse 65, temperature 98.4 F (36.9 C), temperature source Oral, resp. rate 16, last menstrual period 08/02/2014, SpO2 100 %.  Risk to Self: Suicidal Ideation:  (UTA) Suicidal Intent:  (UTA) Is patient  at risk for suicide?:  (UTA) Suicidal Plan?:  (UTA) Access to Means:  (UTA) What has been your use of drugs/alcohol within the last 12 months?:  (UTA- opiate use suspected) Other Self Harm Risks:  (inability to take care of ADL's, urinating on self) Triggers for Past Attempts: None known Intentional Self Injurious Behavior:  (was found unconcious on saturday from overdose) Risk to Others: Homicidal Ideation:  (UTA- pt non responsive to questions) Criminal Charges Pending?:  (UTA) Does patient have a court date:  (UTA) Prior Inpatient Therapy: Prior Inpatient Therapy: Yes Prior Therapy Dates: Dec 2015 Prior Therapy Facilty/Provider(s): [facility] Reason for Treatment: psychosis Prior Outpatient Therapy: Prior Outpatient Therapy:  (UTA) Does patient have an ACCT team?: Unknown Does patient have Intensive In-House Services?  : Unknown Does patient have [facility] services? : Unknown Does patient have P4CC services?: Unknown  Current Facility-Administered Medications  Medication Dose Route Frequency Provider Last Rate Last Dose  . diphenhydrAMINE (BENADRYL) injection 50 mg  50 mg Intramuscular Once Charlesetta Shanks, MD   50 mg at 12/09/14 1716   Current Outpatient Prescriptions  Medication Sig Dispense  Refill  . acetaminophen (TYLENOL) 500 MG tablet Take 500 mg by mouth every 6 (six) hours as needed for headache (headache).    . benztropine (COGENTIN) 0.5 MG tablet Take 1 tablet (0.5 mg total) by mouth 2 (two) times daily. (Patient not taking: Reported on 12/09/2014) 60 tablet 0  . benztropine mesylate (COGENTIN) 1 MG/ML injection Inject 0.5 mLs (0.5 mg total) into the muscle every 30 (thirty) days. First dose received on 04/16/15 with next dose due in thirty days. (Patient not taking: Reported on 12/09/2014) 2 mL 0  . divalproex (DEPAKOTE ER) 250 MG 24 hr tablet Take 3 tablets (750 mg total) by mouth 2 (two) times daily. (Patient not taking: Reported on 12/09/2014) 180 tablet 0  . haloperidol decanoate (HALDOL DECANOATE) 100 MG/ML injection Inject 1 mL (100 mg total) into the muscle every 30 (thirty) days. (Patient not taking: Reported on 05/02/2014) 1 mL 0  . nitrofurantoin, macrocrystal-monohydrate, (MACROBID) 100 MG capsule Take 1 capsule (100 mg total) by mouth 2 (two) times daily. (Patient not taking: Reported on 08/23/2014) 10 capsule 0  . Prenatal Vit-Fe Fumarate-FA (MULTIVITAMIN-PRENATAL) 27-0.8 MG TABS tablet Take 1 tablet by mouth daily at 12 noon.    Marland Kitchen RisperiDONE (RISPERDAL PO) Take 1 tablet by mouth daily.    . traZODone (DESYREL) 100 MG tablet Take 1 tablet (100 mg total) by mouth at bedtime. (Patient not taking: Reported on 12/09/2014) 30 tablet 0    Musculoskeletal: Strength & Muscle Tone: within normal limits Gait & Station: normal Patient leans: N/A  Psychiatric Specialty Exam: Physical Exam  Review of Systems  Unable to perform ROS: mental acuity    Blood pressure 98/58, pulse 65, temperature 98.4 F (36.9 C), temperature source Oral, resp. rate 16, last menstrual period 08/02/2014, SpO2 100 %.There is no weight on file to calculate BMI.  General Appearance: Casual, Disheveled and Actually has been naked in her room.  Needs redirecting.  Eye Contact::  Fair  Speech:  Slow and  did not want to answer questions, looking at providers dazed  Volume:  Decreased  Mood:  unable to obtain did not answer questions directed at her.  Affect:  Congruent  Thought Process:  Circumstantial, Disorganized, Loose and Tangential  Orientation:  Other:  unable to obtain  Thought Content:  unable to obtain, did not answer questions.  Suicidal Thoughts:  unable to obtain  Homicidal Thoughts:  unable to obtain  Memory:  Immediate;   Poor Recent;   Poor Remote;   Poor  Judgement:  Poor  Insight:  Shallow  Psychomotor Activity:  Normal  Concentration:  Poor  Recall:  NA  Fund of Knowledge:Poor  Language: Poor  Akathisia:  No  Handed:  Right  AIMS (if indicated):     Assets:  Others:  unable to obtain  ADL's:  Impaired  Cognition: Impaired,  Severe  Sleep:      Medical Decision Making: Review of Psycho-Social Stressors (1), Established Problem, Worsening (2), Review of Medication Regimen & Side Effects (2) and Review of New Medication or Change in Dosage (2)  Treatment Plan Summary: Daily contact with patient to assess and evaluate symptoms and progress in treatment and Medication management  Plan:  Resume all home medications, We will use our emergency medications for severe agitation ( Geodon, Ativan and Benadryl) Disposition:  Admit and seek   Delfin Gant   PMHNP-BC 12/10/2014 10:43 AM Patient seen face-to-face for psychiatric evaluation, chart reviewed and case discussed with the physician extender and developed treatment plan. Reviewed the information documented and agree with the treatment plan. Corena Pilgrim, MD

## 2014-12-10 NOTE — ED Notes (Signed)
Pt to be seen and evaluated later this am.

## 2014-12-10 NOTE — ED Notes (Signed)
Patient referred to [facility]. Phone referral completed with Brett Canales. Referral packed faxed to [facility]. Authorization #[id number]. Patient is awaiting acceptance to the [facility] wait list.

## 2014-12-10 NOTE — ED Notes (Signed)
Pt restless in her bed.  Asked if she would like something to help her sleep she said no.  Pt watching TV at this time.

## 2014-12-10 NOTE — ED Notes (Signed)
Pt watching TV

## 2014-12-10 NOTE — BHH Counselor (Signed)
Pt referred to:  [facility] Molly [name], [place] OBS Counselor

## 2014-12-11 DIAGNOSIS — F259 Schizoaffective disorder, unspecified: Secondary | ICD-10-CM | POA: Diagnosis not present

## 2014-12-11 NOTE — ED Notes (Signed)
Pt requested nicotine patch. Could not locate patch that was given last night. When writer went to place patch. The pt grabbed the patch and placed it on her lt breast. When writer asked her to put it on her arm, she responded that she liked it there.

## 2014-12-11 NOTE — ED Notes (Cosign Needed)
Reassessment 12/11/2014:  Writer met with patient to complete a reassessment. Patient was sleeping when this writer arrived to her room. Patient became easily awakened when this writer called her name. Patient became alert and willing to engage in a conversation. She denies SI, HI, and AVH's. Patient reports sleeping and eating well during her stay at [facility]. Writer asked patient if she recalls what events initially brought her to [facility]. Patient explains staying in a hotel with a man. Patient not sure who this man is stating, "I just know he looked familiar". Patient sts that she is homeless living shelter to shelter and thought it would be ok to live with this unk man. Patient denies that she was hurt in any way by this man. Patient does not recall overdosing on any medications at the hotel. She continues to ask for vitamins. Writer asked about patient's prenatal vitamins she has been requesting and the fact that she believes she is pregnant. Patient acknowledges that she is not pregnant and believed that she was due to a missed period. Patient spoke of her ACT team [employer] stating, "I fried them last week". Patient stating they were opening up her mail. She is willing to accept them back as a provider but wants them not to open her mail again.  Patient is alert and oriented x4. Her concentration is fair. Her memory seems to be intact to present but mildly impaired to the past. Her insight and judgement are both poor. Her speech is slow and soft. Eye contact is fair. Her appearance is disheveled and malodorous. Her affect is blunted. Mood is appropriate.  Disposition: Dr. Darleene Cleaver continues to recommend inpatient treatment including [facility]. Writer received a call from [facility] stating patient is accepted to the wait list.

## 2014-12-11 NOTE — ED Notes (Signed)
Pt answers questions about suicidal thoughts denying all thoughts to hurt self or others. Pt stares intensely without expression. She does not initiate conversation with others. Pt took medications without difficulty. Safety maintained in the [facility].

## 2014-12-11 NOTE — B[facility] Counselor (Signed)
Pt referred to:  [facility]    Ngoc Daughtridge McNeil, MA OBS Counselor  

## 2014-12-11 NOTE — ED Notes (Signed)
Patient walked into the unit and lay down on bed. Refused to talk or respond to question. The writer introduced self and let patient be at this time. Will continue to monitor patient.

## 2014-12-11 NOTE — ED Notes (Signed)
Patient refused vitals, she can hear me callingher name and knocking on window of door would not respond to me

## 2014-12-11 NOTE — ED Notes (Signed)
Patient denies SI, HI, AVH at present. Cooperative. Speech soft, inappropriate intermittent laughter. Denies feelings of anxiety and depression. Reports vaginal pain, unable to quantify.  Encouragement offered. Given Trazodone, Cogentin, Haldol, heating pad.  Q 15 safety checks continue.

## 2014-12-12 DIAGNOSIS — F259 Schizoaffective disorder, unspecified: Secondary | ICD-10-CM | POA: Diagnosis not present

## 2014-12-12 NOTE — BHH Counselor (Signed)
Pending review for possible placement with [facility].  

## 2014-12-12 NOTE — ED Notes (Signed)
Dr A into see 

## 2014-12-12 NOTE — ED Notes (Signed)
Pt threw cup of OJ on the floor, and put her bed side table on the bed.  Pt reports that she wants to leave and wants to see the Dr.  Rock Nephew then came to the desk and took the phone back to her room.

## 2014-12-12 NOTE — ED Notes (Cosign Needed)
Reassessment 12/12/2014:  Writer met with patient to complete a reassessment. Patient denies SI, HI, and AVH's. Patient not very talkative today. She is calm and cooperative. Patient sts that her appetite is fair. She has slept well since being here at [facility]. Patient asked if she was going somewhere. Writer explained that she was here waiting on placement and she would be evaluated daily.   Patient is alert and oriented x4. Her concentration is fair. Her memory seems to be intact to present but mildly impaired to the past. Her insight and judgement are both poor. Her speech is slow and soft. Eye contact is fair. Her appearance is disheveled and malodorous. Her affect is blunted. Mood is appropriate.

## 2014-12-12 NOTE — ED Notes (Signed)
Josephine NP into talk w/ pt

## 2014-12-12 NOTE — ED Notes (Signed)
Pt up to the desk and reports that she think her breakfast is"diseased"...there is yoke in her eggs, the bacon is not cooked , the biscuit is burned and there is water around the grits.  Sandwich given.

## 2014-12-12 NOTE — ED Notes (Signed)
Pt alert, talking w/ PA

## 2014-12-12 NOTE — ED Notes (Signed)
Pt reports that she can not take zyprexa, it causes seizures

## 2014-12-12 NOTE — ED Notes (Signed)
Pt polite, cooperative

## 2014-12-12 NOTE — ED Notes (Signed)
Up to the bathroom 

## 2014-12-12 NOTE — ED Notes (Signed)
Pt  Up to the desk asking about lunch, pt then began to flutter eye lids and roll her eyes back.  Pt ambulatory back to her room w/o difficulty and layed down on the bed.  Pt then became non verbal, rolling her eyes and fluttering her eye lids.  NP aware.  Pt continued to not respond-no resp distress noted, skin w/d, PA hanna into eval and pt began to talk with her.

## 2014-12-12 NOTE — ED Notes (Signed)
Patient is currently sleeping, vitals well be taken at a later time. Attending RN notified.

## 2014-12-12 NOTE — ED Notes (Signed)
Pt reports nicotine patch fell off and she does not know where it went

## 2014-12-12 NOTE — ED Notes (Signed)
Reassessment 12/12/2014:  Writer met with patient to complete a reassessment. Patient denies SI, HI, and AVH's. Patient not very talkative today. She is calm and cooperative. Patient sts that her appetite is fair. She has slept well since being here at [facility]. Patient asked if she was going somewhere. Writer explained that she was here waiting on placement and she would be evaluated daily.   Patient is alert and oriented x4. Her concentration is fair. Her memory seems to be intact to present but mildly impaired to the past. Her insight and judgement are both poor. Her speech is slow and soft. Eye contact is fair. Her appearance is disheveled and malodorous. Her affect is blunted. Mood is appropriate.

## 2014-12-13 DIAGNOSIS — F258 Other schizoaffective disorders: Secondary | ICD-10-CM | POA: Diagnosis not present

## 2014-12-13 DIAGNOSIS — F259 Schizoaffective disorder, unspecified: Secondary | ICD-10-CM | POA: Diagnosis not present

## 2014-12-13 NOTE — Progress Notes (Signed)
Patient refusing actt team services. Pt requesting bus pass and plans to go to a shelter.   Olga Coaster, [name]  Clinical Social Work  [employer] [phone]

## 2014-12-13 NOTE — ED Notes (Signed)
Pt. Is alert and oriented.  She denies SI, HI, and AVH.  She is pleasant and cooperative with staff.  15 minute checks in place

## 2014-12-13 NOTE — [facility] Suicide Risk Assessment (Signed)
Suicide Risk Assessment  Discharge Assessment   [facility] Discharge Suicide Risk Assessment   Demographic Factors:  NA  Total Time spent with patient: 30 minutes  Musculoskeletal: Strength & Muscle Tone: within normal limits Gait & Station: normal Patient leans: N/A  Psychiatric Specialty Exam: Physical Exam  Review of Systems  Constitutional: Negative.   HENT: Negative.   Eyes: Negative.   Respiratory: Negative.   Cardiovascular: Negative.   Gastrointestinal: Negative.   Genitourinary: Negative.   Musculoskeletal: Negative.   Skin: Negative.   Neurological: Negative.   Endo/Heme/Allergies: Negative.   Psychiatric/Behavioral:       Negative    Blood pressure 108/68, pulse 73, temperature 98.4 F (36.9 C), temperature source Oral, resp. rate 16, last menstrual period 08/02/2014, SpO2 100 %.There is no weight on file to calculate BMI.  General Appearance: Casual  Eye Contact::  Good  Speech:  Normal Rate  Volume:  Normal  Mood:  Euthymic  Affect:  Blunt  Thought Process:  Coherent  Orientation:  Full (Time, Place, and Person)  Thought Content:  WDL  Suicidal Thoughts:  No  Homicidal Thoughts:  No  Memory:  Immediate;   Good Recent;   Good Remote;   Good  Judgement:  Fair  Insight:  Fair  Psychomotor Activity:  Normal  Concentration:  Good  Recall:  Good  Fund of Knowledge:Good  Language: Good  Akathisia:  No  Handed:  Right  AIMS (if indicated):     Assets:  Leisure Time Physical Health Resilience Social Support  ADL's:  Intact  Cognition: WNL  Sleep:         Has this patient used any form of tobacco in the last 30 days? (Cigarettes, Smokeless Tobacco, Cigars, and/or Pipes) No  Mental Status Per Nursing Assessment::   On Admission:   Delusional  Current Mental Status by Physician: NA  Loss Factors: NA  Historical Factors: NA  Risk Reduction Factors:   Positive therapeutic relationship  Continued Clinical Symptoms:  None  Cognitive  Features That Contribute To Risk:  None    Suicide Risk:  Minimal: No identifiable suicidal ideation.  Patients presenting with no risk factors but with morbid ruminations; may be classified as minimal risk based on the severity of the depressive symptoms  Principal Problem: Schizoaffective disorder-chronic with exacerbation Discharge Diagnoses:  Patient Active Problem List   Diagnosis Date Noted  . Schizoaffective disorder-chronic with exacerbation [F25.8] 02/06/2014    Priority: High  . Aggressive behavior [F60.89] 08/16/2013    Priority: High  . 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 Psychoses [F29]   . 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 Elevated WBC count [D72.829] 04/07/2014  . Noncompliance with medication regimen [Z91.14] 04/07/2014  . Papular rash, generalized [R21] 09/01/2012  . Amenorrhea [N91.2] 04/25/2012  . ASC-cannot exclude HGSIL on Pap [R89.6] 02/15/2012  . Screening for malignant neoplasm of the cervix [Z12.4] 02/01/2012  . 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 Obesity, unspecified [E66.9] 02/10/2009  . TOBACCO USER [Z72.0] 02/08/2009  . DIABETES MELLITUS [E11.9] 04/02/2008  . CERVICITIS, GONOCOCCAL, History of [A54.03] 01/09/2007  . TRICHOMONAL VAGINITIS [A59.01] 01/09/2007  . ECZEMA, ATOPIC DERMATITIS [L20.9] 06/30/2006    Follow-up Information    Call [facility].   Contact information:   [facility]  [phone]      Plan Of Care/Follow-up recommendations:  Activity:  as tolerated Diet:  heart healthy diet  Is patient on multiple antipsychotic therapies at discharge:  No   Has Patient had three  or more failed trials of antipsychotic monotherapy by history:  No  Recommended Plan for Multiple Antipsychotic Therapies: NA    LORD, JAMISON, PMH-NP 12/13/2014, 11:17 AM

## 2014-12-13 NOTE — Consult Note (Signed)
[facility] Face-to-Face Psychiatry Consult   Reason for Consult:  Psychosis Referring Physician:  EDP Patient Identification: Molly [name] MRN:  [id number] Principal Diagnosis: Schizoaffective disorder-chronic with exacerbation Diagnosis:   Patient Active Problem List   Diagnosis Date Noted  . Schizoaffective disorder-chronic with exacerbation [F25.8] 02/06/2014    Priority: High  . Aggressive behavior [F60.89] 08/16/2013    Priority: High  . 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 Psychoses [F29]   . 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 Elevated WBC count [D72.829] 04/07/2014  . Noncompliance with medication regimen [Z91.14] 04/07/2014  . Papular rash, generalized [R21] 09/01/2012  . Amenorrhea [N91.2] 04/25/2012  . ASC-cannot exclude HGSIL on Pap [R89.6] 02/15/2012  . Screening for malignant neoplasm of the cervix [Z12.4] 02/01/2012  . 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 Obesity, unspecified [E66.9] 02/10/2009  . TOBACCO USER [Z72.0] 02/08/2009  . DIABETES MELLITUS [E11.9] 04/02/2008  . CERVICITIS, GONOCOCCAL, History of [A54.03] 01/09/2007  . TRICHOMONAL VAGINITIS [A59.01] 01/09/2007  . ECZEMA, ATOPIC DERMATITIS [L20.9] 06/30/2006    Total Time spent with patient: 30 minutes  Subjective:   Molly [name] is a 29 y.o. female patient has stabilized and will be discharged.  HPI:  On admission:  Patient was found at her hotel by the hotel manager not responding to questions. EMS was called. They state she is responsive to pain. On arrival the patient will hold her eyes closed tightly you attempt to open them. She was found with risperidone tablets around her. There are 20-30 packets but only 2 or 3 of them were opened. EMS reports alcohol on scene. Were able to find her demographic information and reviewed that chart demonstrates a history of schizophrenia. Initially the patient remained responsive only to pain however while in the emergency  department she began speaking. After a while she began to only say that she was homeless and had nowhere to stay. She is requesting information on a shelter. She is requesting a sandwich. She has no complaints. Today:  Medications were started on admission to the ED and the patient has stabilized.  She denies suicidal/homicidal ideations, hallucinations, and alcohol/drug abuse.  Her haldol deconate injectable was started on 12/11/14 for psychosis.  Molly [name] wants to return the homeless shelter where she has been living, stable for discharge. HPI Elements:   Location:  generalized. Quality:  acute. Severity:  severe. Timing:  constant. Duration:  few days. Context:  not following medication regiment.  Past Medical History:  Past Medical History  Diagnosis Date  . Schizophrenia   . CERVICITIS, GONOCOCCAL, History of 01/09/2007    Qualifier: History of  By: McDiarmid MD, Tawanna Cooler    . ATTENTION DEFICIT, W/O HYPERACTIVITY, History of 06/30/2006    Qualifier: History of  By: McDiarmid MD, Tawanna Cooler    . Depression   . 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 Dennie Bible (Psych) At [facility] in  [place], [place]. Qualifier: Hospitalized for  By: McDiarmid MD, Tawanna Cooler    . 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 TOBACCO USER 02/08/2009    Qualifier: Diagnosis of  By: Knox Royalty    . 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 ASC-cannot exclude HGSIL on Pap 02/15/2012    ASC-US on 02/03/2012 pap (associated Trichomonas infection). No reflex HPV testing performed on specimen.  Patient informed that she will need repeat Pap in one year.      . Diabetes mellitus     diet controlled  . Eczema   . Abnormal Pap smear     Past Surgical History  Procedure Laterality Date  . Incision and drainage      pilanodal cyst   Family History:  Family History   Problem Relation Age of Onset  . Adopted: Yes  . Bipolar disorder Sister   . Alcohol abuse Brother   . Cancer Father   . Diabetes Mother    Social History:  History  Alcohol Use No    Comment: occ     History  Drug Use No    Social History   Social History  . Marital Status: Single    Spouse Name: N/A  . Number of Children: N/A  . Years of Education: N/A   Social History Main Topics  . Smoking status: Former Smoker -- 0.50 packs/day for 10 years    Types: Cigarettes  . Smokeless tobacco: Never Used  . Alcohol Use: No     Comment: occ  . Drug Use: No  . Sexual Activity: Yes    Birth Control/ Protection: None   Other Topics Concern  . None   Social History Narrative   Adopted   Living in [place] home with Molly [name]   Transportation: Oncologist Social History:    History of alcohol / drug use?: Yes Name of Substance 1: UTA- per [employer] opiate abuse is suspected                   Allergies:   Allergies  Allergen Reactions  . Abilify [Aripiprazole] Other (See Comments)    Thinks its nasty- does not want it.  Injection is ok.      Labs: No results found for this or any previous visit (from the past 48 hour(s)).  Vitals: Blood pressure 108/68, pulse 73, temperature 98.4 F (36.9 C), temperature source Oral, resp. rate 16, last menstrual period 08/02/2014, SpO2 100 %.  Risk to Self: Suicidal Ideation:  (UTA) Suicidal Intent:  (UTA) Is patient at risk for suicide?:  (UTA) Suicidal Plan?:  (UTA) Access to Means:  (UTA) What has been your use of drugs/alcohol within the last 12 months?:  (UTA- opiate use suspected) Other Self Harm Risks:  (inability to take care of ADL's, urinating on self) Triggers for Past Attempts: None known Intentional Self Injurious Behavior:  (was found unconcious on saturday from overdose) Risk to Others: Homicidal Ideation:  (UTA- pt non responsive to questions) Criminal Charges Pending?:  (UTA) Does patient have a  court date:  (UTA) Prior Inpatient Therapy: Prior Inpatient Therapy: Yes Prior Therapy Dates: Dec 2015 Prior Therapy Facilty/Provider(s): [facility] Reason for Treatment: psychosis Prior Outpatient Therapy: Prior Outpatient Therapy:  (UTA) Does patient have an ACCT team?: Unknown Does patient have Intensive In-House Services?  : Unknown Does patient have [facility] services? : Unknown Does patient have P4CC services?: Unknown  Current Facility-Administered Medications  Medication Dose Route Frequency Provider Last Rate Last Dose  . benztropine (COGENTIN) tablet 0.5 mg  0.5 mg Oral BID Jorie Zee   0.5 mg at 12/13/14 1002  . diphenhydrAMINE (BENADRYL) injection 50 mg  50 mg Intramuscular Once Arby Barrette, MD   50 mg at 12/09/14 1716  . divalproex (DEPAKOTE ER) 24 hr tablet 750 mg  750 mg Oral BID Filomeno Cromley   750 mg at 12/13/14 1002  .  haloperidol (HALDOL) tablet 5 mg  5 mg Oral BID Dakari Stabler   5 mg at 12/13/14 1002  . haloperidol decanoate (HALDOL DECANOATE) 100 MG/ML injection 100 mg  100 mg Intramuscular Q30 days Meoshia Billing   100 mg at 12/11/14 1129  . OLANZapine zydis (ZYPREXA) disintegrating tablet 10 mg  10 mg Oral Q8H PRN Laquetta Racey      . traZODone (DESYREL) tablet 100 mg  100 mg Oral QHS Masako Overall   100 mg at 12/12/14 2043   Current Outpatient Prescriptions  Medication Sig Dispense Refill  . acetaminophen (TYLENOL) 500 MG tablet Take 500 mg by mouth every 6 (six) hours as needed for headache (headache).    . benztropine (COGENTIN) 0.5 MG tablet Take 1 tablet (0.5 mg total) by mouth 2 (two) times daily. (Patient not taking: Reported on 12/09/2014) 60 tablet 0  . benztropine mesylate (COGENTIN) 1 MG/ML injection Inject 0.5 mLs (0.5 mg total) into the muscle every 30 (thirty) days. First dose received on 04/16/15 with next dose due in thirty days. (Patient not taking: Reported on 12/09/2014) 2 mL 0  . divalproex (DEPAKOTE ER) 250 MG 24 hr tablet Take 3 tablets (750  mg total) by mouth 2 (two) times daily. (Patient not taking: Reported on 12/09/2014) 180 tablet 0  . haloperidol decanoate (HALDOL DECANOATE) 100 MG/ML injection Inject 1 mL (100 mg total) into the muscle every 30 (thirty) days. (Patient not taking: Reported on 05/02/2014) 1 mL 0  . nitrofurantoin, macrocrystal-monohydrate, (MACROBID) 100 MG capsule Take 1 capsule (100 mg total) by mouth 2 (two) times daily. (Patient not taking: Reported on 08/23/2014) 10 capsule 0  . Prenatal Vit-Fe Fumarate-FA (MULTIVITAMIN-PRENATAL) 27-0.8 MG TABS tablet Take 1 tablet by mouth daily at 12 noon.    Marland Kitchen RisperiDONE (RISPERDAL PO) Take 1 tablet by mouth daily.    . traZODone (DESYREL) 100 MG tablet Take 1 tablet (100 mg total) by mouth at bedtime. (Patient not taking: Reported on 12/09/2014) 30 tablet 0    Musculoskeletal: Strength & Muscle Tone: within normal limits Gait & Station: normal Patient leans: N/A  Psychiatric Specialty Exam: Physical Exam  Review of Systems  Constitutional: Negative.   HENT: Negative.   Eyes: Negative.   Respiratory: Negative.   Cardiovascular: Negative.   Gastrointestinal: Negative.   Genitourinary: Negative.   Musculoskeletal: Negative.   Skin: Negative.   Neurological: Negative.   Endo/Heme/Allergies: Negative.   Psychiatric/Behavioral:       Negative    Blood pressure 108/68, pulse 73, temperature 98.4 F (36.9 C), temperature source Oral, resp. rate 16, last menstrual period 08/02/2014, SpO2 100 %.There is no weight on file to calculate BMI.  General Appearance: Casual  Eye Contact::  Good  Speech:  Normal Rate  Volume:  Normal  Mood:  Euthymic  Affect:  Blunt  Thought Process:  Coherent  Orientation:  Full (Time, Place, and Person)  Thought Content:  WDL  Suicidal Thoughts:  No  Homicidal Thoughts:  No  Memory:  Immediate;   Good Recent;   Good Remote;   Good  Judgement:  Fair  Insight:  Fair  Psychomotor Activity:  Normal  Concentration:  Good  Recall:   Good  Fund of Knowledge:Good  Language: Good  Akathisia:  No  Handed:  Right  AIMS (if indicated):     Assets:  Leisure Time Physical Health Resilience Social Support  ADL's:  Intact  Cognition: WNL  Sleep:      Medical Decision Making: Review of Psycho-Social  Stressors (1), Review or order clinical lab tests (1) and Review of Medication Regimen & Side Effects (2)  Treatment Plan Summary: Daily contact with patient to assess and evaluate symptoms and progress in treatment, Medication management and Plan Discharge home with follow-up at her regular providers  Schizoaffective Disorder, chronic -Continued home medications -Individual counseling -Resources provided for a new ACT team  Plan:  No evidence of imminent risk to self or others at present.   Disposition: Discharge  Nanine Means, PMH-NP 12/13/2014 11:08 AM Patient seen face-to-face for psychiatric evaluation, chart reviewed and case discussed with the physician extender and developed treatment plan. Reviewed the information documented and agree with the treatment plan. Thedore Mins, MD

## 2015-01-09 ENCOUNTER — Emergency Department (HOSPITAL_COMMUNITY)
Admission: EM | Admit: 2015-01-09 | Discharge: 2015-01-09 | Disposition: A | Discharge status: Home / Self Care | Payer: Medicare Other | Attending: Emergency Medicine | Admitting: Emergency Medicine

## 2015-01-09 ENCOUNTER — Encounter (HOSPITAL_COMMUNITY): Payer: Self-pay

## 2015-01-09 ENCOUNTER — Emergency Department (EMERGENCY_DEPARTMENT_HOSPITAL)
Admission: EM | Admit: 2015-01-09 | Discharge: 2015-01-24 | Disposition: A | Discharge status: Other Acute Inpatient Hospital | Payer: Medicare Other | Source: Home / Self Care | Attending: Emergency Medicine | Admitting: Emergency Medicine

## 2015-01-09 ENCOUNTER — Encounter (HOSPITAL_COMMUNITY): Payer: Self-pay | Admitting: Emergency Medicine

## 2015-01-09 DIAGNOSIS — E119 Type 2 diabetes mellitus without complications: Secondary | ICD-10-CM | POA: Insufficient documentation

## 2015-01-09 DIAGNOSIS — F209 Schizophrenia, unspecified: Secondary | ICD-10-CM | POA: Insufficient documentation

## 2015-01-09 DIAGNOSIS — M7989 Other specified soft tissue disorders: Secondary | ICD-10-CM | POA: Diagnosis not present

## 2015-01-09 DIAGNOSIS — Z87891 Personal history of nicotine dependence: Secondary | ICD-10-CM | POA: Insufficient documentation

## 2015-01-09 DIAGNOSIS — F29 Unspecified psychosis not due to a substance or known physiological condition: Secondary | ICD-10-CM

## 2015-01-09 DIAGNOSIS — Z872 Personal history of diseases of the skin and subcutaneous tissue: Secondary | ICD-10-CM | POA: Insufficient documentation

## 2015-01-09 DIAGNOSIS — F2089 Other schizophrenia: Secondary | ICD-10-CM | POA: Diagnosis not present

## 2015-01-09 DIAGNOSIS — F329 Major depressive disorder, single episode, unspecified: Secondary | ICD-10-CM | POA: Insufficient documentation

## 2015-01-09 DIAGNOSIS — F258 Other schizoaffective disorders: Secondary | ICD-10-CM | POA: Diagnosis not present

## 2015-01-09 DIAGNOSIS — N898 Other specified noninflammatory disorders of vagina: Secondary | ICD-10-CM | POA: Insufficient documentation

## 2015-01-09 DIAGNOSIS — Z8619 Personal history of other infectious and parasitic diseases: Secondary | ICD-10-CM | POA: Diagnosis not present

## 2015-01-09 DIAGNOSIS — R4182 Altered mental status, unspecified: Secondary | ICD-10-CM | POA: Diagnosis present

## 2015-01-09 DIAGNOSIS — Z79899 Other long term (current) drug therapy: Secondary | ICD-10-CM | POA: Diagnosis not present

## 2015-01-09 DIAGNOSIS — R45850 Homicidal ideations: Secondary | ICD-10-CM | POA: Diagnosis not present

## 2015-01-09 DIAGNOSIS — F919 Conduct disorder, unspecified: Secondary | ICD-10-CM | POA: Diagnosis not present

## 2015-01-09 DIAGNOSIS — F259 Schizoaffective disorder, unspecified: Secondary | ICD-10-CM

## 2015-01-09 LAB — COMPREHENSIVE METABOLIC PANEL
ALBUMIN: 3.7 g/dL (ref 3.5–5.0)
ALT: 29 U/L (ref 14–54)
AST: 28 U/L (ref 15–41)
Alkaline Phosphatase: 96 U/L (ref 38–126)
Anion gap: 9 (ref 5–15)
BILIRUBIN TOTAL: 0.2 mg/dL — AB (ref 0.3–1.2)
BUN: 11 mg/dL (ref 6–20)
CHLORIDE: 103 mmol/L (ref 101–111)
CO2: 25 mmol/L (ref 22–32)
Calcium: 9 mg/dL (ref 8.9–10.3)
Creatinine, Ser: 0.85 mg/dL (ref 0.44–1.00)
GFR calc Af Amer: >60 mL/min (ref >60)
GFR calc non Af Amer: >60 mL/min (ref >60)
GLUCOSE: 102 mg/dL — AB (ref 65–99)
POTASSIUM: 3.5 mmol/L (ref 3.5–5.1)
SODIUM: 137 mmol/L (ref 135–145)
Total Protein: 7.2 g/dL (ref 6.5–8.1)

## 2015-01-09 LAB — WET PREP, GENITAL
CLUE CELLS WET PREP: NONE SEEN
Trich, Wet Prep: NONE SEEN
Yeast Wet Prep HPF POC: NONE SEEN

## 2015-01-09 LAB — CBC WITH DIFFERENTIAL/PLATELET
BASOS ABS: 0 10*3/uL (ref 0.0–0.1)
BASOS PCT: 0 % (ref 0–1)
Eosinophils Absolute: 0.1 10*3/uL (ref 0.0–0.7)
Eosinophils Relative: 1 % (ref 0–5)
HEMATOCRIT: 33.9 % — AB (ref 36.0–46.0)
Hemoglobin: 11.6 g/dL — ABNORMAL LOW (ref 12.0–15.0)
Lymphocytes Relative: 25 % (ref 12–46)
Lymphs Abs: 2.9 10*3/uL (ref 0.7–4.0)
MCH: 30.1 pg (ref 26.0–34.0)
MCHC: 34.2 g/dL (ref 30.0–36.0)
MCV: 87.8 fL (ref 78.0–100.0)
MONO ABS: 1.4 10*3/uL — AB (ref 0.1–1.0)
Monocytes Relative: 12 % (ref 3–12)
NEUTROS ABS: 7 10*3/uL (ref 1.7–7.7)
Neutrophils Relative %: 62 % (ref 43–77)
PLATELETS: 370 10*3/uL (ref 150–400)
RBC: 3.86 MIL/uL — ABNORMAL LOW (ref 3.87–5.11)
RDW: 14.8 % (ref 11.5–15.5)
WBC: 11.4 10*3/uL — ABNORMAL HIGH (ref 4.0–10.5)

## 2015-01-09 LAB — ETHANOL: Alcohol, Ethyl (B): <5 mg/dL (ref <5)

## 2015-01-09 LAB — GC/CHLAMYDIA PROBE AMP (~~LOC~~) NOT AT ARMC
CHLAMYDIA, DNA PROBE: NEGATIVE
NEISSERIA GONORRHEA: NEGATIVE

## 2015-01-09 LAB — HIV ANTIBODY (ROUTINE TESTING W REFLEX): HIV SCREEN 4TH GENERATION: NONREACTIVE

## 2015-01-09 MED ORDER — CEFTRIAXONE SODIUM 250 MG IJ SOLR
250.0000 mg | Freq: Once | INTRAMUSCULAR | Status: AC
  Administered 2015-01-09: 250 mg via INTRAMUSCULAR
  Filled 2015-01-09: qty 250

## 2015-01-09 MED ORDER — LORAZEPAM 1 MG PO TABS
1.0000 mg | ORAL_TABLET | Freq: Three times a day (TID) | ORAL | Status: DC | PRN
  Administered 2015-01-11 – 2015-01-12 (×2): 1 mg via ORAL
  Filled 2015-01-09 (×2): qty 1

## 2015-01-09 MED ORDER — IBUPROFEN 200 MG PO TABS
600.0000 mg | ORAL_TABLET | Freq: Three times a day (TID) | ORAL | Status: DC | PRN
  Administered 2015-01-12 – 2015-01-15 (×2): 600 mg via ORAL
  Filled 2015-01-09 (×2): qty 3

## 2015-01-09 MED ORDER — ACETAMINOPHEN 325 MG PO TABS
650.0000 mg | ORAL_TABLET | ORAL | Status: DC | PRN
  Administered 2015-01-14 – 2015-01-19 (×2): 650 mg via ORAL
  Filled 2015-01-09 (×3): qty 2

## 2015-01-09 MED ORDER — RISPERIDONE 1 MG PO TBDP
1.0000 mg | ORAL_TABLET | Freq: Two times a day (BID) | ORAL | Status: DC
Start: 2015-01-09 — End: 2015-01-09

## 2015-01-09 MED ORDER — BENZTROPINE MESYLATE 1 MG PO TABS
0.5000 mg | ORAL_TABLET | Freq: Two times a day (BID) | ORAL | Status: DC
  Administered 2015-01-10 – 2015-01-15 (×10): 0.5 mg via ORAL
  Filled 2015-01-09 (×10): qty 1

## 2015-01-09 MED ORDER — HALOPERIDOL 5 MG PO TABS
5.0000 mg | ORAL_TABLET | Freq: Two times a day (BID) | ORAL | Status: DC
  Administered 2015-01-10 – 2015-01-24 (×28): 5 mg via ORAL
  Filled 2015-01-09 (×28): qty 1

## 2015-01-09 MED ORDER — DOXYCYCLINE HYCLATE 100 MG PO CAPS: 100.0000 mg | ORAL_CAPSULE | Freq: Two times a day (BID) | ORAL | Status: DC

## 2015-01-09 MED ORDER — TRAZODONE HCL 100 MG PO TABS
100.0000 mg | ORAL_TABLET | Freq: Every day | ORAL | Status: DC
  Administered 2015-01-10 – 2015-01-23 (×14): 100 mg via ORAL
  Filled 2015-01-09 (×14): qty 1

## 2015-01-09 MED ORDER — LIDOCAINE HCL (PF) 1 % IJ SOLN
INTRAMUSCULAR | Status: AC
  Administered 2015-01-09: 1 mL
  Filled 2015-01-09: qty 5

## 2015-01-09 MED ORDER — NICOTINE 21 MG/24HR TD PT24
21.0000 mg | MEDICATED_PATCH | Freq: Every day | TRANSDERMAL | Status: DC | PRN
  Administered 2015-01-19 – 2015-01-23 (×2): 21 mg via TRANSDERMAL
  Filled 2015-01-09 (×2): qty 1

## 2015-01-09 MED ORDER — ALUM & MAG HYDROXIDE-SIMETH 200-200-20 MG/5ML PO SUSP: 30.0000 mL | ORAL | Status: DC | PRN

## 2015-01-09 MED ORDER — DIVALPROEX SODIUM ER 500 MG PO TB24
750.0000 mg | ORAL_TABLET | Freq: Every day | ORAL | Status: DC
  Administered 2015-01-10 – 2015-01-23 (×14): 750 mg via ORAL
  Filled 2015-01-09 (×14): qty 1

## 2015-01-09 MED ORDER — OLANZAPINE 10 MG PO TBDP
10.0000 mg | ORAL_TABLET | Freq: Three times a day (TID) | ORAL | Status: DC | PRN
  Administered 2015-01-11 – 2015-01-24 (×18): 10 mg via ORAL
  Filled 2015-01-09 (×18): qty 1

## 2015-01-09 NOTE — ED Notes (Signed)
Pt continues to run into hallway totally naked, pt will not put scrubs on.

## 2015-01-09 NOTE — ED Notes (Signed)
Went in to obtain vitals and ask for urine sample. Pt would not reply to Clinical research associate. Informed her of needing urine sample again and she put her head back underneath covers and started snoring

## 2015-01-09 NOTE — ED Notes (Signed)
Per Triage Staff, the Pt remains w/ [employer], but only wants to be seen for foot pain.  [employer] is currently working on [employer] the Pt, due to bizarre behaviors.

## 2015-01-09 NOTE — ED Notes (Signed)
Per EMS pt waqs found naked walking in the street by [employer] so they were called out. Vitals 128/68, 106HR, 16R, 98% on room air. CBG 122.  Pt only complaint is feet hurt.

## 2015-01-09 NOTE — ED Notes (Signed)
Bed: RESB Expected date:  Expected time:  Means of arrival:  Comments: Lying naked on a trailer

## 2015-01-09 NOTE — ED Notes (Signed)
Pt ambulatory.

## 2015-01-09 NOTE — ED Notes (Signed)
Pt aware of need of urine sample. States she is unable to obtain at this time.

## 2015-01-09 NOTE — ED Provider Notes (Signed)
Pelvic Exam:  No foreign bodies in vagina. No cervical discharge, CMT, adnexal mass. There is mild right adnexal tenderness.   Elpidio Anis, PA-C 01/09/15 0310  Loren Racer, MD 01/09/15 [phone]

## 2015-01-09 NOTE — ED Notes (Signed)
Pt walked to bathroom totally naked, pt given supplies for shower. Pt uncooperative, not forthcoming with information.  Pt swiped at [employer] upon transfer to [facility], brought over on stretcher, pt refusing to walk at the time.  Monitoring for safety, Q 15 min checks in effect.

## 2015-01-09 NOTE — ED Notes (Signed)
Pt very uncooperative.  Refused lab work.
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
## 2015-01-09 NOTE — ED Notes (Signed)
Pt refused vital signs.

## 2015-01-09 NOTE — ED Notes (Signed)
Per Dr Lynelle Doctor do not repeat her lab work.

## 2015-01-09 NOTE — Discharge Instructions (Signed)
Cervicitis °Cervicitis is a soreness and swelling (inflammation) of the cervix. Your cervix is located at the bottom of your uterus. It opens up to the vagina. °CAUSES  °· Sexually transmitted infections (STIs).   °· Allergic reaction.   °· Medicines or birth control devices that are put in the vagina.   °· Injury to the cervix.   °· Bacterial infections.   °RISK FACTORS °You are at greater risk if you: °· Have unprotected sexual intercourse. °· Have sexual intercourse with many partners. °· Began sexual intercourse at an early age. °· Have a history of STIs. °SYMPTOMS  °There may be no symptoms. If symptoms occur, they may include:  °· Gray, white, yellow, or bad-smelling vaginal discharge.   °· Pain or itching of the area outside the vagina.   °· Painful sexual intercourse.   °· Lower abdominal or lower back pain, especially during intercourse.   °· Frequent urination.   °· Abnormal vaginal bleeding between periods, after sexual intercourse, or after menopause.   °· Pressure or a heavy feeling in the pelvis.   °DIAGNOSIS  °Diagnosis is made after a pelvic exam. Other tests may include:  °· Examination of any discharge under a microscope (wet prep).   °· A Pap test.   °TREATMENT  °Treatment will depend on the cause of cervicitis. If it is caused by an STI, both you and your partner will need to be treated. Antibiotic medicines will be given.  °HOME CARE INSTRUCTIONS  °· Do not have sexual intercourse until your health care provider says it is okay.   °· Do not have sexual intercourse until your partner has been treated, if your cervicitis is caused by an STI.   °· Take your antibiotics as directed. Finish them even if you start to feel better.   °SEEK MEDICAL CARE IF: °· Your symptoms come back.   °· You have a fever.   °MAKE SURE YOU:  °· Understand these instructions. °· Will watch your condition. °· Will get help right away if you are not doing well or get worse. °Document Released: 04/19/2005 Document Revised:  04/24/2013 Document Reviewed: 10/11/2012 °[employer]® Patient Information ©2015 [employer]. This information is not intended to replace advice given to you by your health care provider. Make sure you discuss any questions you have with your health care provider. ° °

## 2015-01-09 NOTE — ED Notes (Signed)
Pt refused nighttime meds, states she will only take a Tetanus shot.
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
## 2015-01-09 NOTE — ED Notes (Signed)
Pt refusing for staff to get vital signs at this time.  Pt states, "I need a tetanus shot before you can get my vital signs."  When asked why pt needs a tetanus shot she responds, "Because i got bit by a grizzly bear".
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
## 2015-01-10 DIAGNOSIS — F258 Other schizoaffective disorders: Secondary | ICD-10-CM | POA: Diagnosis not present

## 2015-01-10 DIAGNOSIS — N898 Other specified noninflammatory disorders of vagina: Secondary | ICD-10-CM | POA: Diagnosis not present

## 2015-01-10 MED ORDER — LORAZEPAM 2 MG/ML IJ SOLN
INTRAMUSCULAR | Status: AC
  Filled 2015-01-10: qty 1

## 2015-01-10 MED ORDER — DIPHENHYDRAMINE HCL 50 MG/ML IJ SOLN
INTRAMUSCULAR | Status: AC
  Filled 2015-01-10: qty 1

## 2015-01-10 MED ORDER — ZIPRASIDONE MESYLATE 20 MG IM SOLR
INTRAMUSCULAR | Status: AC
  Filled 2015-01-10: qty 20

## 2015-01-10 MED ORDER — LORAZEPAM 2 MG/ML IJ SOLN
2.0000 mg | Freq: Once | INTRAMUSCULAR | Status: AC
  Administered 2015-01-10: 2 mg via INTRAMUSCULAR

## 2015-01-10 MED ORDER — DIPHENHYDRAMINE HCL 50 MG/ML IJ SOLN
50.0000 mg | Freq: Once | INTRAMUSCULAR | Status: AC
  Administered 2015-01-10: 50 mg via INTRAMUSCULAR

## 2015-01-10 MED ORDER — ZIPRASIDONE MESYLATE 20 MG IM SOLR
20.0000 mg | Freq: Once | INTRAMUSCULAR | Status: AC
  Administered 2015-01-10: 20 mg via INTRAMUSCULAR

## 2015-01-10 NOTE — ED Notes (Signed)
Pt. Noted sleeping in room. No complaints or concerns voiced. No distress or abnormal behavior noted. Will continue to monitor with security cameras. Q 15 minute rounds continue. 

## 2015-01-10 NOTE — BH Assessment (Signed)
Inpt recommended. Sent referrals to: [facility], [facility], [facility], [place], [place] Triage Specialist 01/10/2015 1:33 AM

## 2015-01-10 NOTE — ED Notes (Signed)
Patient up out of bed out to the Nurse's station; the floor is wet from inside her room all along her route to the Nurse's window. Patient does not answer when asked why she didn't use the bathroom she does not respond. Gave patient towels to sop up the fluid in her room which is covering about all of the floor in her room. Patient given fresh paper scrubs to put on. Staff and nurse cleaning up the wet room and trail of apparent urine. Patient has been very loud and vocal mostly repeatedly asking "can i have a soda" or cursing loudly when staff not immediately indulging her with her commands. Patient requiring IM injections of Ativan, Benadryl, and Geodon earlier in the shift which had effective result. Patient can be very loud and and demanding in angry manner but does not answer or display behavior positve for SI/HI/AVH. Patient continues on q15 minute checks for safety. Renato Gails, RNBSN
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
## 2015-01-10 NOTE — BH Assessment (Signed)
[facility] Assessment Progress Note This Clinical research associate sent her information to be reviewed by the following facilities for admission: [facility], [facility] and [facility]. Status pending.
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
## 2015-01-10 NOTE — ED Notes (Signed)
Sandwich and soft drink given.  

## 2015-01-10 NOTE — ED Notes (Signed)
Patient urinated in the floor.  She asked for a soda and staff informed her she would have to clean up her urine off the floor before she could receive it.

## 2015-01-10 NOTE — ED Notes (Signed)
Report received from Caroline RN. Pt. Sleeping, respirations regular and unlabored.  Will continue to monitor for safety via security cameras and Q 15 minute checks. 

## 2015-01-10 NOTE — ED Notes (Signed)
During 15 minutes checks, patient is in front of her door inside her room; Nurse can see patient on all 4's just inside door where she has her bare bottom raised high and head down at the floor; Nurse unable to determine what patient is doing but asking her several times what she is doing. She lifts her head a few inches off the floor and a circular chocolate candy wrapper can be seen on the floor where she had her face. Patient standing up, opening her door, then quickly jamming the candy wrapper into Nurse's left front pocket of uniform top, then states in high pitched, soft voice while smiling, "Could i have some soda?'

## 2015-01-10 NOTE — Progress Notes (Addendum)
Pt referred to [facility] for priority review, [facility] Auth [id number]. CSW completed demographics with Coralee North  Clinical Social Work  [facility] [phone]

## 2015-01-11 DIAGNOSIS — N898 Other specified noninflammatory disorders of vagina: Secondary | ICD-10-CM | POA: Diagnosis not present

## 2015-01-11 NOTE — ED Notes (Signed)
Pt. Noted sleeping in room. No complaints or concerns voiced. No distress or abnormal behavior noted. Will continue to monitor with security cameras. Q 15 minute rounds continue. 

## 2015-01-11 NOTE — ED Notes (Signed)
Pt. Noted in room. No complaints or concerns voiced. No acute distress noted. Will continue to monitor with security cameras. Q 15 minute rounds continue. 

## 2015-01-11 NOTE — ED Notes (Signed)
Report received from LuAnn RN. Pt. Alert and oriented in no distress denies SI, HI, AVH and pain.  Pt. Instructed to come to me with problems or concerns.Will continue to monitor for safety via security cameras and Q 15 minute checks.  

## 2015-01-11 NOTE — ED Notes (Signed)
Pt. Noted in room. No complaints or concerns voiced. No acute distress noted. Will continue to monitor with security cameras. Q 15 minute rounds continue. Sandwich and soft drink given.

## 2015-01-11 NOTE — ED Notes (Signed)
Pt. Noted in hall with fitted sheet wrapped around her. No complaints or concerns voiced. Pt. Placed a rolled up blanket in room 36 and was redirected to her room and instructed to not go into other rooms. Will continue to monitor with security cameras. Q 15 minute rounds continue.

## 2015-01-11 NOTE — ED Notes (Signed)
Pt. Noted in room. No complaints or concerns voiced. No distress or abnormal behavior noted. Will continue to monitor with security cameras. Q 15 minute rounds continue. 

## 2015-01-11 NOTE — BH Assessment (Signed)
Patient was reassessed on 01/11/2015.   Patient continues to be agitated and poured milk on her head. Patient also threw orange juice down the hallway. Patient continues to be incontinent of urine and continues to urinate in her room and in the hallways. Patient denies SI/HI and AVH. Patient requested to be discharged.  Provider informed of patients request. Nanine Means, DNP and Dr. Elsie Saas continue to recommend inpatient treatment at this time. Patient is recommended to be placed on [facility] waitlist at this time.     Davina Poke, [name] Therapeutic Triage Specialist [facility] 01/11/2015 11:26 AM

## 2015-01-11 NOTE — BHH Counselor (Signed)
[facility]( per Molly [name] 01/11/15 9:50 p.m., at capacity has not been reviewed as of yet)   [facility]( 01/11/15 9:54 p.m. per Molly [name] at [facility] patient fax received but showing as placed on list [facility] at capacity, but was recommended to re-submit for referral consideration. [place].   Pending : Per Molly [name] at [facility], 01/11/15 10:00 p.m. reviews for patients will be conducted in the a.m.  Per Molly [name] At [facility] , patient is on waiting list  - Declined : Per Molly [name] 01/11/15 patient was declined referral due to chronicity of illness for [place].  Molly Hone K. Tiburcio Pea, MS  Counselor 01/11/2015 10:03 PM

## 2015-01-11 NOTE — ED Notes (Signed)
Pt. C/o anxiety and insomnia. 

## 2015-01-11 NOTE — ED Notes (Signed)
Patient has been quiet and guarded this shift.  Has emerged from room on several occasions with no pants on and is unwilling to put on pants. States "its too hot".

## 2015-01-12 DIAGNOSIS — R45850 Homicidal ideations: Secondary | ICD-10-CM | POA: Diagnosis not present

## 2015-01-12 DIAGNOSIS — F258 Other schizoaffective disorders: Secondary | ICD-10-CM | POA: Diagnosis not present

## 2015-01-12 DIAGNOSIS — N898 Other specified noninflammatory disorders of vagina: Secondary | ICD-10-CM | POA: Diagnosis not present

## 2015-01-12 LAB — URINALYSIS, ROUTINE W REFLEX MICROSCOPIC
BILIRUBIN URINE: NEGATIVE
Glucose, UA: NEGATIVE mg/dL
Hgb urine dipstick: NEGATIVE
KETONES UR: 15 mg/dL — AB
NITRITE: NEGATIVE
PROTEIN: NEGATIVE mg/dL
Specific Gravity, Urine: 1.039 — ABNORMAL HIGH (ref 1.005–1.030)
UROBILINOGEN UA: 0.2 mg/dL (ref 0.0–1.0)
pH: 6 (ref 5.0–8.0)

## 2015-01-12 LAB — PREGNANCY, URINE: PREG TEST UR: NEGATIVE

## 2015-01-12 LAB — URINE MICROSCOPIC-ADD ON

## 2015-01-12 MED ORDER — LORAZEPAM 2 MG/ML IJ SOLN: 2.0000 mg | Freq: Four times a day (QID) | INTRAMUSCULAR | Status: DC

## 2015-01-12 MED ORDER — HALOPERIDOL LACTATE 5 MG/ML IJ SOLN
INTRAMUSCULAR | Status: AC
  Filled 2015-01-12: qty 1

## 2015-01-12 MED ORDER — LORAZEPAM 2 MG/ML IJ SOLN
INTRAMUSCULAR | Status: AC
  Administered 2015-01-12: 2 mg
  Filled 2015-01-12: qty 1

## 2015-01-12 MED ORDER — HALOPERIDOL LACTATE 5 MG/ML IJ SOLN
5.0000 mg | Freq: Once | INTRAMUSCULAR | Status: AC
  Administered 2015-01-12: 5 mg via INTRAMUSCULAR

## 2015-01-12 NOTE — ED Notes (Signed)
Pt. Noted in room. No complaints or concerns voiced. No distress or abnormal behavior noted. Will continue to monitor with security cameras. Q 15 minute rounds continue. 

## 2015-01-12 NOTE — ED Notes (Signed)
Pt. Noted sleeping in room. No complaints or concerns voiced. No distress or abnormal behavior noted. Will continue to monitor with security cameras. Q 15 minute rounds continue. 

## 2015-01-12 NOTE — ED Notes (Signed)
Patient became destructive in room. Taking TV screen off and throwing items in room staying "i'm ready to go and I'm pregnant with 10 babies".  Unable to redirect.  EDP called for eval and emergent medications.
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
## 2015-01-12 NOTE — ED Notes (Signed)
Report received from [facility]. Pt. Received IM medications for aggressive behavior. Pt. More calm at this time.

## 2015-01-12 NOTE — ED Notes (Signed)
Pt. Noted in hall. No complaints or concerns voiced. No acute distress noted. Will continue to monitor with security cameras. Q 15 minute rounds continue. 

## 2015-01-12 NOTE — ED Notes (Signed)
Patient is asking for "flea medicine,milk of magnesia, and betadine for bugs crawing on my skin."  Dificult to redirect.

## 2015-01-12 NOTE — ED Notes (Signed)
Patient is intrusive this shift.  Demanding a "rabies and tetnus shot".  Redirection done.  PRN medication given.

## 2015-01-13 DIAGNOSIS — N898 Other specified noninflammatory disorders of vagina: Secondary | ICD-10-CM | POA: Diagnosis not present

## 2015-01-13 MED ORDER — DIPHENHYDRAMINE HCL 50 MG/ML IJ SOLN
50.0000 mg | Freq: Once | INTRAMUSCULAR | Status: AC
  Administered 2015-01-13: 50 mg via INTRAMUSCULAR
  Filled 2015-01-13: qty 1

## 2015-01-13 MED ORDER — LORAZEPAM 2 MG/ML IJ SOLN
2.0000 mg | Freq: Once | INTRAMUSCULAR | Status: AC
Start: 2015-01-13 — End: 2015-01-13
  Administered 2015-01-13: 2 mg via INTRAMUSCULAR
  Filled 2015-01-13: qty 1

## 2015-01-13 MED ORDER — HALOPERIDOL LACTATE 5 MG/ML IJ SOLN
5.0000 mg | Freq: Once | INTRAMUSCULAR | Status: AC
  Administered 2015-01-13: 5 mg via INTRAMUSCULAR
  Filled 2015-01-13: qty 1

## 2015-01-13 NOTE — ED Notes (Signed)
When patient woke up she came into hallway with no pants on.  She was given pants and walked back to her room where it was a mess.  There was urine all over the floor and on the bed.  Toothpaste all over the wall.  Pt was asked to stand in hallway as we cleaned but she kept coming into room and glaring at [place].

## 2015-01-13 NOTE — ED Notes (Signed)
Pt. Noted sleeping in room. No complaints or concerns voiced. No distress or abnormal behavior noted. Will continue to monitor with security cameras. Q 15 minute rounds continue. 

## 2015-01-13 NOTE — ED Notes (Signed)
Pt sleeping. No vitals taken. RN notified.

## 2015-01-13 NOTE — Progress Notes (Signed)
pcp is [facility] [address] [place] [phone]   

## 2015-01-13 NOTE — ED Notes (Signed)
Pt wrapped in sheet and blanket, exposing self as her baseline. Awake, alert & responsive, no distress noted. Ringing emergency alarm when instructed not to do so.  Monitoring for safety, Q 15 min checks in effect.
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
## 2015-01-13 NOTE — ED Notes (Signed)
Pt laying in hallway in front of room # 35, pt currently being assessed by TTS, upon several attempts for pt to go back to room, security at bedside  To redirect pt back to room.

## 2015-01-13 NOTE — ED Notes (Signed)
Pt continues to stand at nurses station talking about "satans eyes and lies"
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
## 2015-01-13 NOTE — ED Notes (Signed)
Upon 15 minute checks pt was noted in bathroom urinating on floor.  After redirecting her she refused to use toilet and had a bowel movement on the paper bag in bathroom.  Pt is increasing in her irritability and seems to have issues with one of the MHT's.  She keeps glaring at the MHT and following her around unit.  She has stood in front of multiple rooms and staring at them.

## 2015-01-13 NOTE — ED Notes (Signed)
She is aggitated and intrusive.  She will not leave the nurses station even when asked due to pt. privacy

## 2015-01-14 DIAGNOSIS — N898 Other specified noninflammatory disorders of vagina: Secondary | ICD-10-CM | POA: Diagnosis not present

## 2015-01-14 NOTE — Progress Notes (Signed)
[facility] was pursued on day of admission, however since patient refused urine sample until 9/11 patient was not reviewed by [facility].   CSW resubmitted everything on 9/12 for review.   Per Junious Dresser, pt is now on waitlist.   Olga Coaster, [name]  Clinical Social Work  [facility] [phone]

## 2015-01-14 NOTE — BH Assessment (Signed)
Patient was reassessed on 01/14/2015.  Patient was on the mat on the floor watching television. 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 Patient states that she was known as "Newman Nickels" at that time and she may look different because she "used to be light skinned with a big afro, but I got a dark tan all over." Patient states that she has been taking her medications as prescribed and eating meals.  Patient denies SI and HI. Patient appears to be suffering from delusions at this time.  Dr. Jannifer Franklin and Willow Ora, NP continue to recommend inpatient treatment for this patient at this time.   Davina Poke, [name] Therapeutic Triage Specialist [facility] 01/14/2015 9:45 AM

## 2015-01-14 NOTE — ED Notes (Signed)
Attempted to take pulse manually as automatic resulted 130. Pt was restless and writer was unable to complete manual pulse. Pulse appeared steady. No symptoms of immediate distress.

## 2015-01-14 NOTE — ED Notes (Signed)
Court from [facility] ACT team came to visit patient [phone]

## 2015-01-14 NOTE — ED Notes (Signed)
Pt sleeping at present, no distress noted, calm at present.  Monitoring for safety, Q 15 min checks in effect. 

## 2015-01-14 NOTE — ED Notes (Signed)
D:Pt has been out in the hall pacing and standing in front of nurses station. Pt's mood is labile and her room had trash on the floor this morning. With much prompting, pt has picked up some items in her room. Pt was looking at this writer making comments about my figure as vitals signs were being taken.   A:Offered support and redirection.  R:Safety maintained on the unit.

## 2015-01-15 DIAGNOSIS — N898 Other specified noninflammatory disorders of vagina: Secondary | ICD-10-CM | POA: Diagnosis not present

## 2015-01-15 MED ORDER — BENZTROPINE MESYLATE 1 MG PO TABS
1.0000 mg | ORAL_TABLET | Freq: Two times a day (BID) | ORAL | Status: DC
  Administered 2015-01-15 – 2015-01-24 (×18): 1 mg via ORAL
  Filled 2015-01-15 (×18): qty 1

## 2015-01-15 MED ORDER — LORAZEPAM 1 MG PO TABS
2.0000 mg | ORAL_TABLET | Freq: Once | ORAL | Status: AC
  Administered 2015-01-15: 2 mg via ORAL
  Filled 2015-01-15: qty 2

## 2015-01-15 MED ORDER — LORAZEPAM 2 MG/ML IJ SOLN
2.0000 mg | Freq: Once | INTRAMUSCULAR | Status: AC
  Filled 2015-01-15: qty 1

## 2015-01-15 MED ORDER — DIPHENHYDRAMINE HCL 25 MG PO CAPS
25.0000 mg | ORAL_CAPSULE | Freq: Once | ORAL | Status: AC
  Administered 2015-01-15: 25 mg via ORAL
  Filled 2015-01-15: qty 1

## 2015-01-15 NOTE — ED Notes (Signed)
Ioma has been growing more agitated. She began removing clothing, exposing her chest, and attempting to go in other patient's rooms to ask intrusive questions. "Are you gay?" Redirection largely unsuccessful. Obtained order PRN Ativan and Benadryl. Pt was receptive to taking PO. She is now in her room. Will continue to monitor for needs/anxiety.

## 2015-01-15 NOTE — ED Notes (Signed)
Pt awake, alert & responsive, no distress noted, uncooperative, intrusive. MOnitoring for safety, Q 15 min checks in effect at present.

## 2015-01-15 NOTE — ED Notes (Signed)
Pt has been disorganized and labile, as reported previously. She believes this Clinical research associate is "Jennifer's dead body." Pt has to be reminded repeatedly to cover up. She will not answer questions regarding SI/HI/AVH. Will continue to monitor for needs/safety.

## 2015-01-15 NOTE — ED Notes (Signed)
Pt showering at present. Security in dept on standby, pt exposing self to other pts, redirected back to room.

## 2015-01-15 NOTE — BH Assessment (Signed)
[facility] Assessment Progress Note Patient was re-assessed this date and continues to wait for an admission to [facility].

## 2015-01-15 NOTE — ED Notes (Signed)
Pt urinated in the hallway outside the nurses' station. She asked to speak with the MD. This writer attempted to speak with pt, and she said, "You're Christina's dead body. I can't speak with Christina's dead body. I get a whuppin' if I speak with Christina's dead body." Pt given scrub pants, underwear, and socks. She's currently in bathroom changing.

## 2015-01-15 NOTE — Progress Notes (Signed)
Pt remains on [facility] wait list per Robinette.   Ajay Strubel, [name]  Clinical Social Work  [facility] [phone]     

## 2015-01-15 NOTE — ED Notes (Signed)
Pt refused EKG while becoming irate and verbally abusive to staff.

## 2015-01-16 DIAGNOSIS — N898 Other specified noninflammatory disorders of vagina: Secondary | ICD-10-CM | POA: Diagnosis not present

## 2015-01-16 DIAGNOSIS — F258 Other schizoaffective disorders: Secondary | ICD-10-CM | POA: Diagnosis not present

## 2015-01-16 MED ORDER — DIPHENHYDRAMINE HCL 25 MG PO CAPS
25.0000 mg | ORAL_CAPSULE | Freq: Once | ORAL | Status: AC
  Administered 2015-01-16: 25 mg via ORAL
  Filled 2015-01-16: qty 1

## 2015-01-16 MED ORDER — HALOPERIDOL DECANOATE 100 MG/ML IM SOLN
100.0000 mg | Freq: Once | INTRAMUSCULAR | Status: AC
  Administered 2015-01-16: 100 mg via INTRAMUSCULAR
  Filled 2015-01-16: qty 1

## 2015-01-16 MED ORDER — BENZTROPINE MESYLATE 1 MG PO TABS: 0.5000 mg | ORAL_TABLET | Freq: Two times a day (BID) | ORAL | Status: DC

## 2015-01-16 MED ORDER — DIPHENHYDRAMINE HCL 50 MG/ML IJ SOLN
50.0000 mg | Freq: Once | INTRAMUSCULAR | Status: AC
  Administered 2015-01-16: 50 mg via INTRAMUSCULAR
  Filled 2015-01-16: qty 1

## 2015-01-16 MED ORDER — HALOPERIDOL DECANOATE 100 MG/ML IM SOLN: 100.0000 mg | INTRAMUSCULAR | Status: DC

## 2015-01-16 MED ORDER — LORAZEPAM 1 MG PO TABS
2.0000 mg | ORAL_TABLET | Freq: Once | ORAL | Status: AC
Start: 2015-01-16 — End: 2015-01-16
  Administered 2015-01-16: 2 mg via ORAL
  Filled 2015-01-16: qty 2

## 2015-01-16 NOTE — ED Notes (Signed)
Pt noted acting suspicious in bathroom.  After she returned to room nurse confronted her and she threw soap from bathroom soap dispenser across room.  Batteries were noted missing from soap dispenser and we promptly  Searched her room.  Security was present and we did not find batteries .  She screamed and cried and demanded to be transferred to another facility.  Security and [employer] remain present as pt. Remains extremely agitated.

## 2015-01-16 NOTE — ED Notes (Signed)
Patient walked up to a staff person and said, "here, take these."  She then pulled 4 D cell batteries out of her makeshift bra (made out of mesh disposable underwear).  She told us she got them out of the bathroom.  When the bathroom was searched it was discovered that the paper towel dispenser had been tampered with.  When opened, all four batteries were missing.  We then searched her room and found an empty spray bottle (the kind used by environmental services).  There was also a pillow case full of urine soaked linen and a pile of old food that was thrown away.  Patient became angry and tried to pull items out of our hands, she then went out into the hallway and screamed, then pushed the large waste basket knocking it over.  She then sat on the floor and cried.  She did finally give up items that she had taken.  She was given some Ativan and her monthly injection of haloperidol decanoate.

## 2015-01-16 NOTE — Progress Notes (Signed)
Pt remains on [facility] waitlist per Connie   Shaneya Taketa, [name]  Clinical Social Work  [facility] [phone]     

## 2015-01-16 NOTE — ED Notes (Signed)
Patient in bed resting after she received her HS medications. She remains grandiose, manic and delusional.

## 2015-01-16 NOTE — ED Notes (Signed)
Vital signs stable. 

## 2015-01-16 NOTE — [facility] Counselor (Signed)
In the hallway patient stopped counselor and states "here I found this stuff in the bathroom." Patient handed this counselor four large batteries, a piece of paper, two white rings that look like they may have come from the top of a lotion bottle, and a hygiene bottle. Informed patients nurse and MHT took items.  Davina Poke, [name] Therapeutic Triage Specialist [facility] 01/16/2015 12:43 PM
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
## 2015-01-16 NOTE — Progress Notes (Signed)
CSW spoke with Erskine Squibb of [facility] who states that the patient is not meeting criteria to be prioritized at this time. She states that the information regarding the patient's current behavior is not enough at this time. [facility] is not offering the patient at bed currently.   CSW will continue to follow up with [facility] and keep facility updated with patient information.  Trish Mage [phone] ED CSW 01/16/2015 4:51 PM

## 2015-01-16 NOTE — BH Assessment (Signed)
[facility]HH Assessment Progress Note  Supplemental information about pt's behavior in the ED today has been faxed to [facility], along with request that she be prioritized.  At 15:52 Alla German at [facility] confirms receipt and acknowledges request for priority status.  Doylene Canning, MA Triage Specialist [phone]
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
## 2015-01-16 NOTE — ED Notes (Signed)
Pt has asked for scrubs multiple times.  At first she asked for 3X and took them into bathroom where she ripped them up.  She asked for shower stuff and was given a new pair and body wash.  Under video monitoring it was observed that pt. Did not take shower and ripped up scrubs and flushed soap bottles down toilet.  When confronted she became very  defensive and began yelling  Obscenities at staff.  After a couple of hours of her pants falling down she asked for smaller scrub pants.  She continues to stand at nurses station and be very intrusive.

## 2015-01-16 NOTE — Progress Notes (Signed)
Patient sat on the hallway with security and Law enforcement personnel standing besides her at the beginning of the shift. See day shift RN note. For details of what happened. Patient ripped her pant off and tie a peace on her head. She appeared manic and disorganized. She also flushed deodorants and other stuff  in the toilet; the maintenance staff was able to retreive them out the the toilet. She was speaking other language that Clinical research associate did understand. Writer encouraged patient to to to her room, offered her new pair of scrub, and Zyprexa 10 mg given for agitation. Patient has a someone staff sitting beside her for safety. Per report she removed batteries from the paper dispenser in the bathroom. Will continue to monitor.

## 2015-01-17 DIAGNOSIS — N898 Other specified noninflammatory disorders of vagina: Secondary | ICD-10-CM | POA: Diagnosis not present

## 2015-01-17 NOTE — ED Notes (Signed)
Patient resting quietly with eyes closed. Respirations even and unlabored. No distress noted. Q 15 minute check continues as ordered for safety. 

## 2015-01-17 NOTE — ED Notes (Signed)
Sitter at bedside.

## 2015-01-17 NOTE — ED Notes (Addendum)
Patient resting quietly with eyes closed. Respirations even and unlabored. No distress noted. I:1 observations continue for safety.

## 2015-01-17 NOTE — ED Notes (Signed)
Patient resting quiet with eyes closed. Respirations even and unlabored. No distress noted. 1:1 observation continues for safety.

## 2015-01-17 NOTE — ED Notes (Signed)
Pt. Noted sleeping in room. No complaints or concerns voiced. No distress or abnormal behavior noted. Will continue to monitor with security cameras. Q 15 minute rounds continue. 

## 2015-01-17 NOTE — ED Notes (Signed)
Pt. Noted in room with sitter at bedside. No complaints or concerns voiced. No distress noted. Will continue to monitor with security cameras. Q 15 minute rounds continue.

## 2015-01-17 NOTE — ED Notes (Signed)
Pt. Noted in hall. No complaints or concerns voiced. No acute distress noted. Will continue to monitor with security cameras. Q 15 minute rounds continue. 

## 2015-01-17 NOTE — Clinical Social Work Note (Signed)
CSW attempted to re assess pt but she was sleeping.  CSW will follow up with pt when she is awake.  Elray Buba, [name] [facility] triage

## 2015-01-17 NOTE — ED Notes (Signed)
Patient complaint of difficulty falling asleep. She requested for benadryl to help her fall asleep. Writer notified NP. She ordered benadryl 25 mg.

## 2015-01-17 NOTE — ED Notes (Signed)
Pt. Noted in shower room. No complaints or concerns voiced. No acute distress noted. Will continue to monitor with security cameras. Q 15 minute rounds continue.  

## 2015-01-17 NOTE — ED Notes (Signed)
Report received from [facility]. Pt. Alert and oriented in no distress.  Pt. With sitter. Pt. Instructed to come to me with problems or concerns.Will continue to monitor for safety via security cameras and Q 15 minute checks.

## 2015-01-17 NOTE — ED Notes (Signed)
Patient resting with eyes closed. Respirations even and unlabored. No distress noted. 1:1 observations continues as ordered for safety.

## 2015-01-17 NOTE — ED Notes (Signed)
Patient in bed, awake. No distress or agitation at this time. Sitter i room with patient. I:1 continues as ordered for safety.
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
## 2015-01-18 ENCOUNTER — Encounter (HOSPITAL_COMMUNITY): Payer: Self-pay | Admitting: Radiology

## 2015-01-18 ENCOUNTER — Emergency Department (HOSPITAL_COMMUNITY): Payer: Medicare Other

## 2015-01-18 DIAGNOSIS — M7989 Other specified soft tissue disorders: Secondary | ICD-10-CM | POA: Diagnosis not present

## 2015-01-18 DIAGNOSIS — N898 Other specified noninflammatory disorders of vagina: Secondary | ICD-10-CM | POA: Diagnosis not present

## 2015-01-18 NOTE — ED Notes (Signed)
In the day room watching tv w/ sitter

## 2015-01-18 NOTE — ED Notes (Signed)
Up to the bathroom 

## 2015-01-18 NOTE — ED Notes (Addendum)
Up in the hall walking w/sitter and talking w/ others.  Pt's room/bed has been cleaned and it was noted that she had urinated on the sheets .  Pt noted to make sexually inappropriate comments at times, and talk in a child like voice.

## 2015-01-18 NOTE — ED Notes (Signed)
Pt. Noted sleeping in room. No complaints or concerns voiced. No distress or abnormal behavior noted. Will continue to monitor with security cameras. Q 15 minute rounds continue. 

## 2015-01-18 NOTE — ED Notes (Addendum)
Up to the bathroom, sitter w/ pt. 

## 2015-01-18 NOTE — ED Notes (Signed)
Pt. Noted in room with sitter at bedside. No acute distress  noted. Will continue to monitor with security cameras. Q 15 minute rounds continue.

## 2015-01-18 NOTE — ED Notes (Addendum)
Pt up to the bathroom w/ sittter.  Pt stood over the floor drain and began to urinate in the drain.  When instructed to use the toilet pt responded that she didn't know how.

## 2015-01-18 NOTE — ED Notes (Signed)
Pt. Noted in hall with sitter. Pt. intermittently leaving her room. Redirection marginally effective. Will continue to monitor with security cameras. Q 15 minute rounds continue.

## 2015-01-18 NOTE — ED Notes (Signed)
Pt. Anxious and moderately agitated.

## 2015-01-18 NOTE — ED Notes (Signed)
Report received from Janie Rambo RN. Pt. Alert and oriented in no distress denies SI, HI, AVH and pain.  Pt. Instructed to come to me with problems or concerns.Will continue to monitor for safety via security cameras and Q 15 minute checks. 

## 2015-01-18 NOTE — ED Notes (Signed)
Pt. Noted sleeping in room with sitter at bedside. No complaints or concerns voiced. No distress or abnormal behavior noted. Will continue to monitor with security cameras. Q 15 minute rounds continue. 
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
## 2015-01-18 NOTE — ED Notes (Signed)
Up to the desk and reported that she has a T-rex living with her at home

## 2015-01-18 NOTE — ED Notes (Addendum)
Pt. Noted sleeping in room with sitter at bedside. No complaints or concerns voiced. No distress or abnormal behavior noted. Will continue to monitor with security cameras. Q 15 minute rounds continue. 

## 2015-01-18 NOTE — BHH Counselor (Signed)
Patient stopped this Clinical research associate in the hall and states that the medication is "working on my mind but not my body" and looked at her arms. Patient asked this writer to tell someone who she could talk to about medicine. Informed provider of patients statement.   Davina Poke, [name] Therapeutic Triage Specialist [facility] 01/18/2015 12:17 PM

## 2015-01-18 NOTE — ED Notes (Signed)
Pt has reported that she has a splinter in her finger (RMF).  Pt is unable to specify how long it has been there.  Pad- mid RMF red/swollen, not drainage noted. EDP updated and will see.

## 2015-01-19 DIAGNOSIS — N898 Other specified noninflammatory disorders of vagina: Secondary | ICD-10-CM | POA: Diagnosis not present

## 2015-01-19 LAB — URINALYSIS, ROUTINE W REFLEX MICROSCOPIC
BILIRUBIN URINE: NEGATIVE
Glucose, UA: NEGATIVE mg/dL
HGB URINE DIPSTICK: NEGATIVE
Ketones, ur: NEGATIVE mg/dL
NITRITE: NEGATIVE
PH: 6.5 (ref 5.0–8.0)
Protein, ur: NEGATIVE mg/dL
SPECIFIC GRAVITY, URINE: 1.014 (ref 1.005–1.030)
Urobilinogen, UA: 0.2 mg/dL (ref 0.0–1.0)

## 2015-01-19 LAB — URINE MICROSCOPIC-ADD ON

## 2015-01-19 NOTE — ED Notes (Signed)
Pt. Noted sleeping in room. No complaints or concerns voiced. No distress or abnormal behavior noted. Will continue to monitor with security cameras. Q 15 minute rounds continue. 
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
## 2015-01-19 NOTE — ED Notes (Signed)
Up to the bathroom, sitter w/ pt

## 2015-01-19 NOTE — ED Notes (Signed)
Pt up to the desk, medicated.  Pt observed bumping the into the shoulder of her sitter when walking by her in the hall.  Discussed w/ pt that that is not acceptable. Pt sts that she does not like to be watched. "don't like it when adults watch little kids..i"m 29 years old."

## 2015-01-19 NOTE — ED Notes (Signed)
Up walking in the hall, sitter w/ pt

## 2015-01-19 NOTE — ED Notes (Addendum)
Sitting in room, animated, very child like.  Pt reports that she did not take the antibiotics that were prescribe when she was here last time.

## 2015-01-19 NOTE — ED Notes (Signed)
Pt up to the bathroom vomiting.  Nicotine patch removed.  Pt denies nausea after episode occurred.  Pt up to the desk talking w/ staff/sitter.  Pt noted to chewing on object-hand wipe from lunch tray.  Pt spite the paper out and was instructed not to chew on it again.

## 2015-01-19 NOTE — ED Notes (Signed)
Pt sitting in room, talking in child like voice.  Pt has urinated on the bed, when asked why she did not get up to go to the bathroom she responded "i don't know how."  Pt instructed to shower and change scrubs, and did so w/ sitter presnet.  While in the room  was cleaned.  Matress appears to be saturated w/ liquid.

## 2015-01-19 NOTE — ED Notes (Signed)
Josepine PA aware of patients vomiting and her chewing on the hand wipe.

## 2015-01-19 NOTE — ED Notes (Signed)
Pt c/o burngin w/ urination and frequent urination to [facility], will  inform NP

## 2015-01-19 NOTE — ED Notes (Signed)
Pt. Noted in room with sitter at bedside. No complaints or concerns voiced. No distress or abnormal behavior noted. Will continue to monitor with security cameras. Q 15 minute rounds continue. 

## 2015-01-19 NOTE — ED Notes (Signed)
Pt. Agitated and somewhat uncooperative when asked to go back to her room. Pt. Requesting Zyprexa.

## 2015-01-19 NOTE — ED Notes (Signed)
Pt. Noted in rest room. No complaints or concerns voiced. No distress or abnormal behavior noted. Will continue to monitor with security cameras. Q 15 minute rounds continue.  

## 2015-01-19 NOTE — ED Notes (Signed)
Report received from Lizbeth Bark RN. Pt. Alert and oriented in no distress denies SI, HI, AVH and pain.  Sitter in room with patient. Pt. Instructed to come to me with problems or concerns.Will continue to monitor for safety via security cameras and Q 15 minute checks.

## 2015-01-19 NOTE — ED Notes (Signed)
No nausea, drinking water w/o difficulty, nad, watching tv

## 2015-01-19 NOTE — ED Notes (Signed)
Up to the shower 

## 2015-01-19 NOTE — ED Notes (Signed)
Soft drink given. 

## 2015-01-19 NOTE — ED Notes (Signed)
Pt requesting a nicotine patch, reports that she does still smoke

## 2015-01-19 NOTE — ED Notes (Signed)
TTS into see 

## 2015-01-20 DIAGNOSIS — N898 Other specified noninflammatory disorders of vagina: Secondary | ICD-10-CM | POA: Diagnosis not present

## 2015-01-20 MED ORDER — CEPHALEXIN 500 MG PO CAPS
500.0000 mg | ORAL_CAPSULE | Freq: Three times a day (TID) | ORAL | Status: DC
  Administered 2015-01-20 – 2015-01-24 (×12): 500 mg via ORAL
  Filled 2015-01-20 (×14): qty 1

## 2015-01-20 NOTE — ED Notes (Signed)
Pt awake, alert & responsive, no distress noted, 1-1 sitter at bedside.  Pt incontinent of urine x 1, comfort measures given, pt took a shower.  Monitoring for safety, Q 15 min checks in effect.

## 2015-01-20 NOTE — ED Notes (Signed)
Pt. Noted sleeping in room. No complaints or concerns voiced. No distress or abnormal behavior noted. Will continue to monitor with security cameras. Q 15 minute rounds continue. 

## 2015-01-20 NOTE — BH Assessment (Signed)
Pt was drowsy and gave minimal response to questions asked. She denies SI or HI at this time and states she has been eating and drinking ok. No changes in status noted.   Kateri Plummer, M.S., LPCA, [place], [facility] Licensed Professional Counselor Associate  Triage Specialist  [facility]  Therapeutic Triage Services Phone: [phone] Fax: [phone]

## 2015-01-20 NOTE — ED Notes (Signed)
Pt. Noted sleeping in room with sitter at bedside. No complaints or concerns voiced. No distress or abnormal behavior noted. Will continue to monitor with security cameras. Q 15 minute rounds continue. 

## 2015-01-20 NOTE — ED Notes (Signed)
Pt becoming agitated and hit her tv.  When asked what was bothering her she refused to answer.

## 2015-01-20 NOTE — ED Notes (Signed)
Pt. Noted sleeping in room sitter in attendance. No complaints or concerns voiced. No distress or abnormal behavior noted. Will continue to monitor with security cameras. Q 15 minute rounds continue.

## 2015-01-20 NOTE — Progress Notes (Addendum)
CSW met with patient at bedside upon her request. Patient was cooperative at bedside. She did not express any complaints or concerns.   Patient made random conversation with CSW regarding clothing and hair products while talking in a soft/ baby like voice. Also, patient expressed that she is ready to leave [facility].  Willette Brace [phone] ED CSW 01/20/2015 5:31 PM

## 2015-01-21 DIAGNOSIS — N898 Other specified noninflammatory disorders of vagina: Secondary | ICD-10-CM | POA: Diagnosis not present

## 2015-01-21 MED ORDER — LORAZEPAM 2 MG/ML IJ SOLN
2.0000 mg | Freq: Once | INTRAMUSCULAR | Status: AC
  Administered 2015-01-21: 2 mg via INTRAMUSCULAR
  Filled 2015-01-21: qty 1

## 2015-01-21 MED ORDER — HALOPERIDOL LACTATE 5 MG/ML IJ SOLN
5.0000 mg | Freq: Once | INTRAMUSCULAR | Status: AC
  Administered 2015-01-21: 5 mg via INTRAMUSCULAR
  Filled 2015-01-21: qty 1

## 2015-01-21 MED ORDER — DIPHENHYDRAMINE HCL 50 MG/ML IJ SOLN
25.0000 mg | Freq: Once | INTRAMUSCULAR | Status: AC
  Administered 2015-01-21: 25 mg via INTRAMUSCULAR
  Filled 2015-01-21: qty 1

## 2015-01-21 NOTE — Clinical Social Work Note (Signed)
CSW met with pt to re assess.  Pt discussed being "an elder".  She also stated that she does not want to go to a group home but wants to go back to the shelter.  She stated that she is "exercising her toes".  She denied SI or HI.  Still waiting for in patient bed at [facility]  .Dede Query, [name] [facility] triage

## 2015-01-21 NOTE — ED Notes (Signed)
Pt awake, alert & responsive, no distress noted, resting at present, easily arouseable to verbal stimuli.  Monitoring for safety, Q 15 min checks in effect.

## 2015-01-21 NOTE — ED Notes (Signed)
Pt became agitated and threw breakfast tray against wall.  She was also yelling and throwing food.  Security present and MD notified.  Prn order obtained.

## 2015-01-21 NOTE — Progress Notes (Signed)
CSW met with member of patient's ACT Team/ Cecilie Lowers. Cecilie Lowers states that he met with patient and she was cooperative.   Cecilie Lowers informed CSW that the patient is homeless, and that the patient has been a client with [facility] for approximately 4 or 5 months. He says that the patient is known to be unstable and often needs constant crisis intervention.  Willette Brace [phone] ED CSW 01/21/2015 8:56 PM

## 2015-01-21 NOTE — Progress Notes (Signed)
Pt remains on [facility] waitlist, per Amor.   Olga Coaster, [name]  Clinical Social Work  [employer] [phone]

## 2015-01-21 NOTE — Progress Notes (Signed)
pcp is [facility] [address] [place] [phone]

## 2015-01-22 DIAGNOSIS — N898 Other specified noninflammatory disorders of vagina: Secondary | ICD-10-CM | POA: Diagnosis not present

## 2015-01-22 NOTE — ED Notes (Signed)
Pt is walking in the hall smiling and talking to herself at times. She has been standing at the nurses station this morning childlike and demanding. Assisted pt and encouraged her to clean her room. Redirected pt as needed. Safety maintained on the unit.

## 2015-01-22 NOTE — ED Notes (Signed)
Pt sleeping at present, easily arouseable to verbal stimuli.  No distress noted, monitoring for safety, Q 15 min checks in effect.

## 2015-01-22 NOTE — ED Notes (Signed)
Pt seen tearing her scrubs and attempting to put a tie around her neck. Ties taken from pt. She has a magazine now and is out in front of the nurses station. Safety maintained.

## 2015-01-23 DIAGNOSIS — F258 Other schizoaffective disorders: Secondary | ICD-10-CM | POA: Diagnosis not present

## 2015-01-23 DIAGNOSIS — N898 Other specified noninflammatory disorders of vagina: Secondary | ICD-10-CM | POA: Diagnosis not present

## 2015-01-23 MED ORDER — DIVALPROEX SODIUM ER 500 MG PO TB24
750.0000 mg | ORAL_TABLET | Freq: Two times a day (BID) | ORAL | Status: DC
  Administered 2015-01-23 – 2015-01-24 (×2): 750 mg via ORAL
  Filled 2015-01-23 (×4): qty 1

## 2015-01-23 MED ORDER — NICOTINE POLACRILEX 2 MG MT GUM
2.0000 mg | CHEWING_GUM | OROMUCOSAL | Status: DC | PRN
  Administered 2015-01-23 – 2015-01-24 (×2): 2 mg via ORAL
  Filled 2015-01-23 (×2): qty 1

## 2015-01-23 NOTE — Consult Note (Signed)
   Psychiatric Specialty Exam: Physical Exam  ROS  Blood pressure 106/61, pulse 95, temperature 98.6 F (37 C), temperature source Oral, resp. rate 16, last menstrual period 11/17/2014, SpO2 100 %, unknown if currently breastfeeding.There is no weight on file to calculate BMI.  General Appearance: Disheveled  Eye Solicitor:: Fair  Speech: Normal Rate  Volume: Normal  Mood: Irritable  Affect: Blunt  Thought Process: Disorganized  Orientation: Full (Time, Place, and Person)  Thought Content: Delusions and Hallucinations: Auditory Visual  Suicidal Thoughts: No  Homicidal Thoughts:NO  Memory: POOR Recent; POOR Remote; POOR  Judgement: Impaired  Insight: Lacking  Psychomotor Activity: Increased  Concentration: POOR  Recall: POOR  Fund of Knowledge:  POOR  Language: FAIR  Akathisia: No  Handed: Right  AIMS (if indicated):    Assets: Housing Leisure Time Physical Health Resilience  ADL's: Intact  Cognition: Impaired, Moderate         Sleep:   fair   Patient with history of schizoaffective disorder-bipolar type. She was seen, interviewed, her chart reviewed and case discussed with the treatment team. Patient remains bizarre, delusional and her behavior remains disorganized. She paces up and down the hallway, trash her room and refused personal hygiene and grooming. She has flight of ideas and fixed delusion that she has 4 children and none of them has a name. Patient states that she is [place] and speaks in foreign language which nobody understands. Patient is intrusive and requires constant redirection. [facility]  Assessment: Schizoaffective disorder-chronic with exacerbation   Plan:  Will continue patient on current medication regimen for mood stabilization and to address delusions.  Awaiting placement in [facility].  Thedore Mins, MD 01/23/2015.

## 2015-01-23 NOTE — ED Notes (Signed)
Dr a and josephine NP into see 

## 2015-01-23 NOTE — ED Notes (Signed)
Pt up in hall, remains cooperative and directable.

## 2015-01-23 NOTE — ED Notes (Addendum)
On the phone, nicotine patch found on the floor in pt's room, discarded.

## 2015-01-23 NOTE — ED Notes (Signed)
On the phone 
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
## 2015-01-23 NOTE — ED Notes (Signed)
House keeping in to mop patients room at her request.  Pt talking with her, child like and stated that her name was "Molly [name] and she was 29 yrs old."

## 2015-01-24 DIAGNOSIS — D509 Iron deficiency anemia, unspecified: Secondary | ICD-10-CM | POA: Diagnosis not present

## 2015-01-24 DIAGNOSIS — R000 Tachycardia, unspecified: Secondary | ICD-10-CM | POA: Diagnosis not present

## 2015-01-24 DIAGNOSIS — F1011 Alcohol abuse, in remission: Secondary | ICD-10-CM | POA: Diagnosis not present

## 2015-01-24 DIAGNOSIS — B009 Herpesviral infection, unspecified: Secondary | ICD-10-CM | POA: Diagnosis not present

## 2015-01-24 DIAGNOSIS — R296 Repeated falls: Secondary | ICD-10-CM | POA: Diagnosis not present

## 2015-01-24 DIAGNOSIS — Z9181 History of falling: Secondary | ICD-10-CM | POA: Diagnosis not present

## 2015-01-24 DIAGNOSIS — S83512A Sprain of anterior cruciate ligament of left knee, initial encounter: Secondary | ICD-10-CM | POA: Diagnosis not present

## 2015-01-24 DIAGNOSIS — F1211 Cannabis abuse, in remission: Secondary | ICD-10-CM | POA: Diagnosis not present

## 2015-01-24 DIAGNOSIS — Z79899 Other long term (current) drug therapy: Secondary | ICD-10-CM | POA: Diagnosis not present

## 2015-01-24 DIAGNOSIS — Z308 Encounter for other contraceptive management: Secondary | ICD-10-CM | POA: Diagnosis not present

## 2015-01-24 DIAGNOSIS — N939 Abnormal uterine and vaginal bleeding, unspecified: Secondary | ICD-10-CM | POA: Diagnosis not present

## 2015-01-24 DIAGNOSIS — F209 Schizophrenia, unspecified: Secondary | ICD-10-CM | POA: Diagnosis not present

## 2015-01-24 DIAGNOSIS — Z3202 Encounter for pregnancy test, result negative: Secondary | ICD-10-CM | POA: Diagnosis not present

## 2015-01-24 DIAGNOSIS — N898 Other specified noninflammatory disorders of vagina: Secondary | ICD-10-CM | POA: Diagnosis not present

## 2015-01-24 DIAGNOSIS — E669 Obesity, unspecified: Secondary | ICD-10-CM | POA: Diagnosis not present

## 2015-01-24 DIAGNOSIS — E785 Hyperlipidemia, unspecified: Secondary | ICD-10-CM | POA: Diagnosis not present

## 2015-01-24 DIAGNOSIS — Z6841 Body Mass Index (BMI) 40.0 and over, adult: Secondary | ICD-10-CM | POA: Diagnosis not present

## 2015-01-24 DIAGNOSIS — E559 Vitamin D deficiency, unspecified: Secondary | ICD-10-CM | POA: Diagnosis not present

## 2015-01-24 DIAGNOSIS — Z3009 Encounter for other general counseling and advice on contraception: Secondary | ICD-10-CM | POA: Diagnosis not present

## 2015-01-24 DIAGNOSIS — N83201 Unspecified ovarian cyst, right side: Secondary | ICD-10-CM | POA: Diagnosis not present

## 2015-01-24 DIAGNOSIS — Z599 Problem related to housing and economic circumstances, unspecified: Secondary | ICD-10-CM | POA: Diagnosis not present

## 2015-01-24 DIAGNOSIS — K5900 Constipation, unspecified: Secondary | ICD-10-CM | POA: Diagnosis not present

## 2015-01-24 DIAGNOSIS — L03313 Cellulitis of chest wall: Secondary | ICD-10-CM | POA: Diagnosis not present

## 2015-01-24 DIAGNOSIS — N83291 Other ovarian cyst, right side: Secondary | ICD-10-CM | POA: Diagnosis not present

## 2015-01-24 DIAGNOSIS — Z720 Tobacco use: Secondary | ICD-10-CM | POA: Diagnosis not present

## 2015-01-24 DIAGNOSIS — F200 Paranoid schizophrenia: Secondary | ICD-10-CM | POA: Diagnosis not present

## 2015-01-24 NOTE — Progress Notes (Signed)
Pt accepted to [facility] by Vivien Rossetti. Pt to be transported by sheriff under IVC, once new petition is served. RN to call report to [phone].   Olga Coaster, [name]  Clinical Social Work  [employer] [phone]

## 2015-01-24 NOTE — Progress Notes (Signed)
Pt accepted to [facility] for bed today, pending vitals, mar, legals, and recent nursing notes. CSW faxing to Vivien Rossetti for review.   Olga Coaster, [name]  Clinical Social Work  [employer] [phone]

## 2015-01-24 NOTE — BH Assessment (Signed)
BHH Assessment Progress Note   Pt seen this day for reassessment.  Pt stated she was pregnant, pulling her shirt down, there was vomit on the floor, pt stating the nurses were saying that her baby was dead and "that is not cool."  Pt denies SI or HI.  Pt is on [facility] wait list.  Casimer Lanius, MS, [facility] Therapeutic Triage Specialist [facility]

## 2015-01-24 NOTE — ED Notes (Signed)
Pt discharged safely with sheriff deputy.  Pt was in no distress at discharge.

## 2015-01-24 NOTE — ED Notes (Signed)
Pt woke up agitated asking about a giant.  She came out without her pants.  She denies SI, HI, and AVH however she keeps asking who killed the giant.  Video monitoring and 15 minute checks in place.

## 2015-04-08 DIAGNOSIS — Z3009 Encounter for other general counseling and advice on contraception: Secondary | ICD-10-CM | POA: Diagnosis not present

## 2015-04-08 DIAGNOSIS — Z308 Encounter for other contraceptive management: Secondary | ICD-10-CM | POA: Diagnosis not present

## 2015-04-08 DIAGNOSIS — Z3202 Encounter for pregnancy test, result negative: Secondary | ICD-10-CM | POA: Diagnosis not present

## 2015-04-08 DIAGNOSIS — Z79899 Other long term (current) drug therapy: Secondary | ICD-10-CM | POA: Diagnosis not present

## 2015-07-31 IMAGING — CR DG FINGER RING 2+V*L*
3 series · 3 of 3 positions shown · non-contrast
Comparison: None.

CLINICAL DATA: Finger swelling.

EXAM:
LEFT RING FINGER 2+V

[PA]
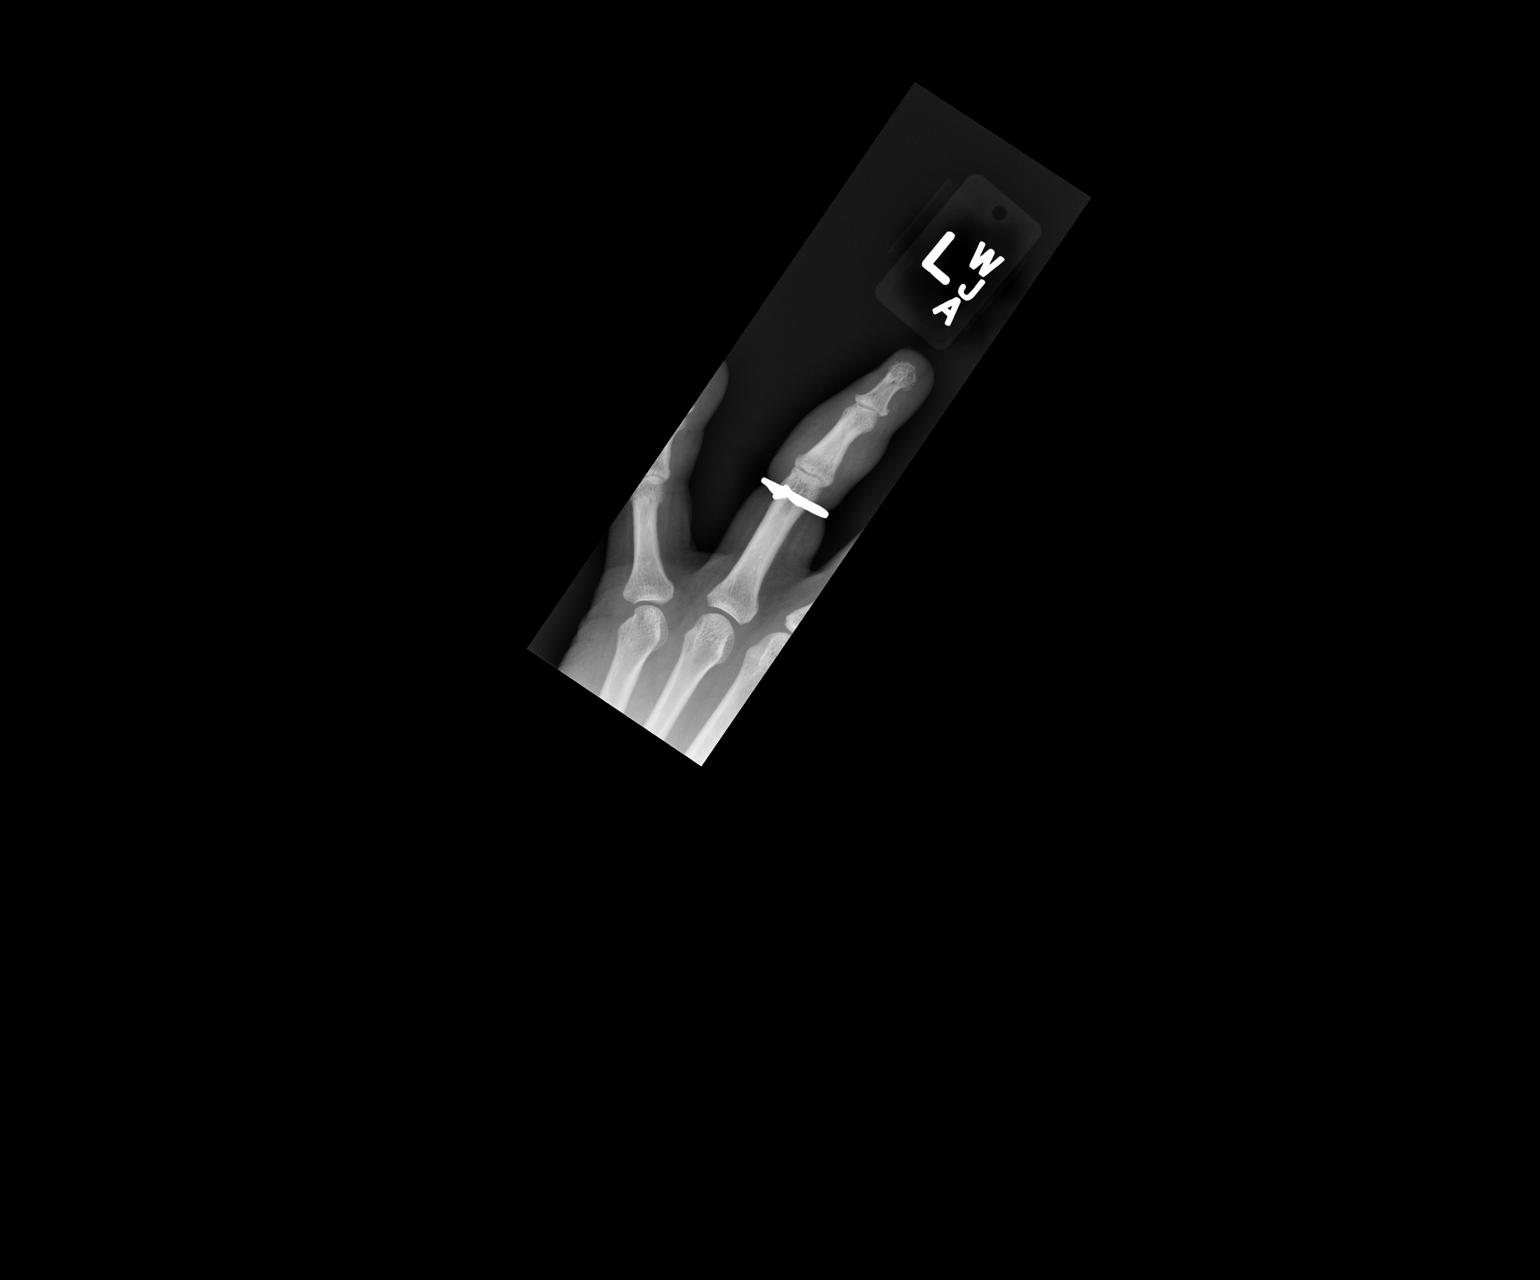

[lateral]
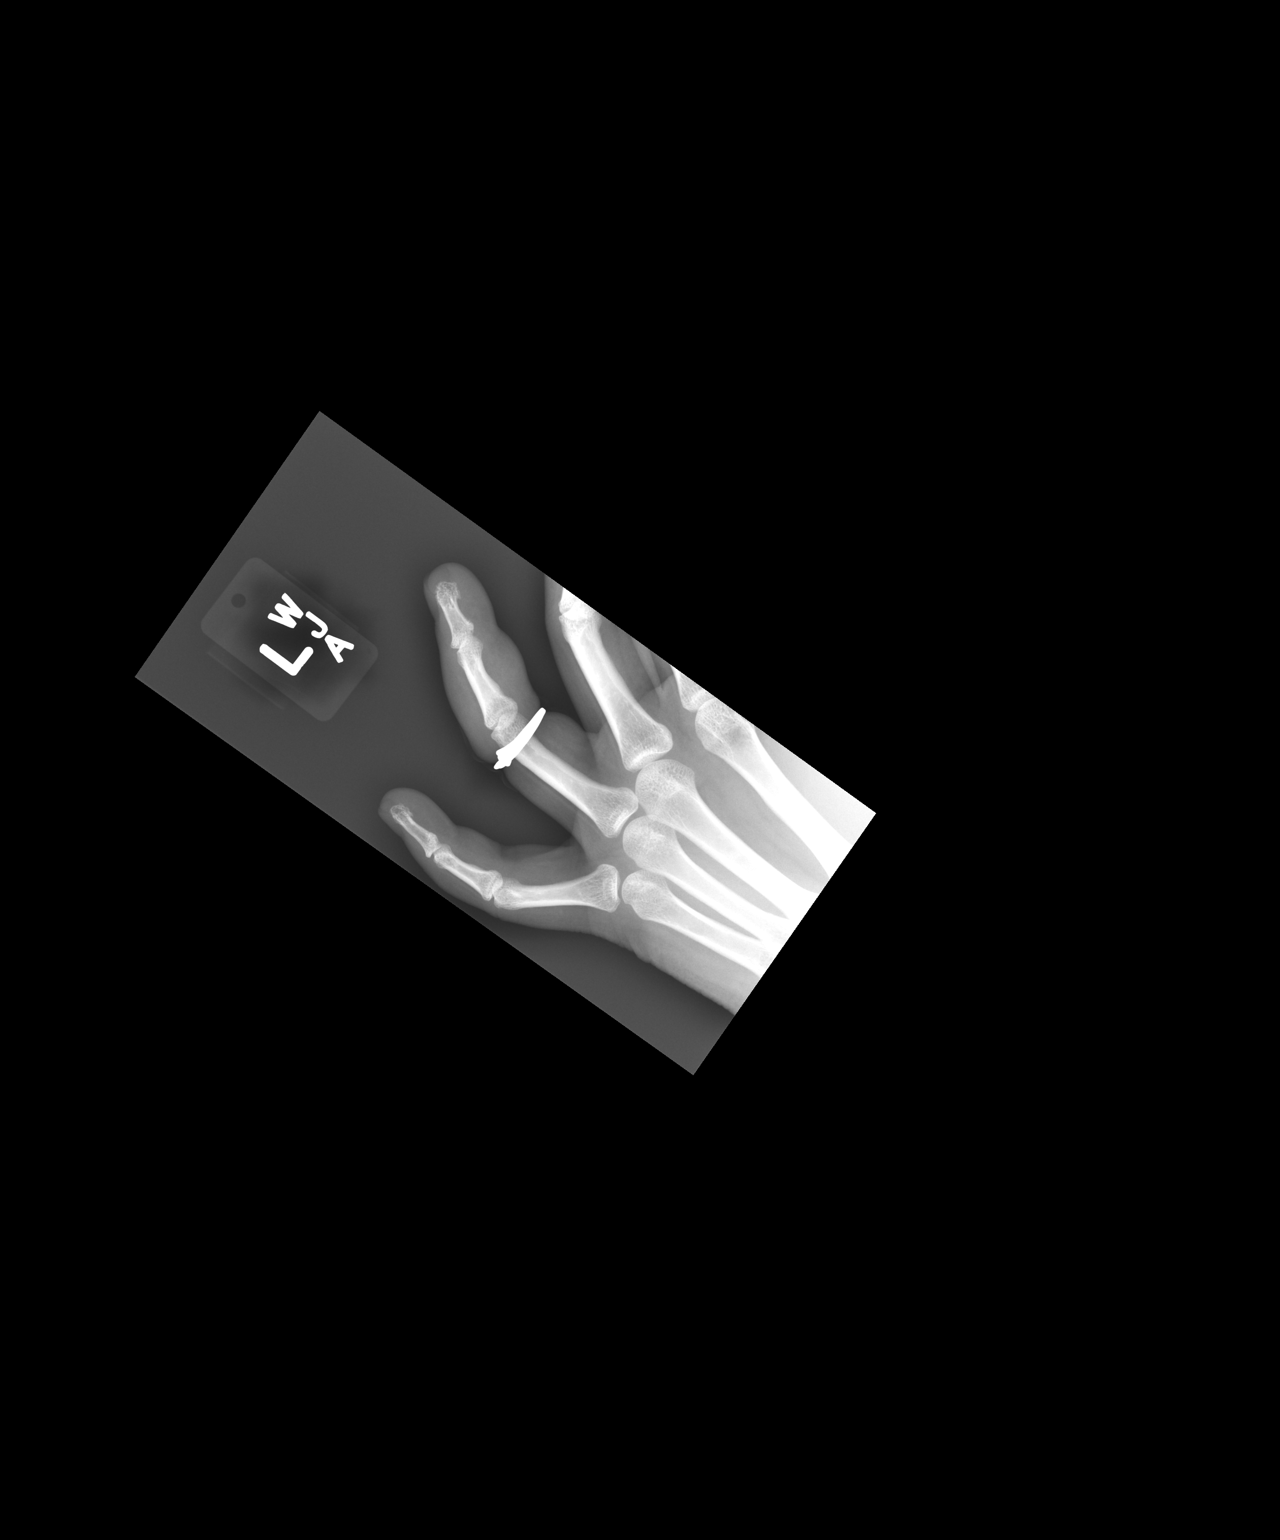

[pa obl]
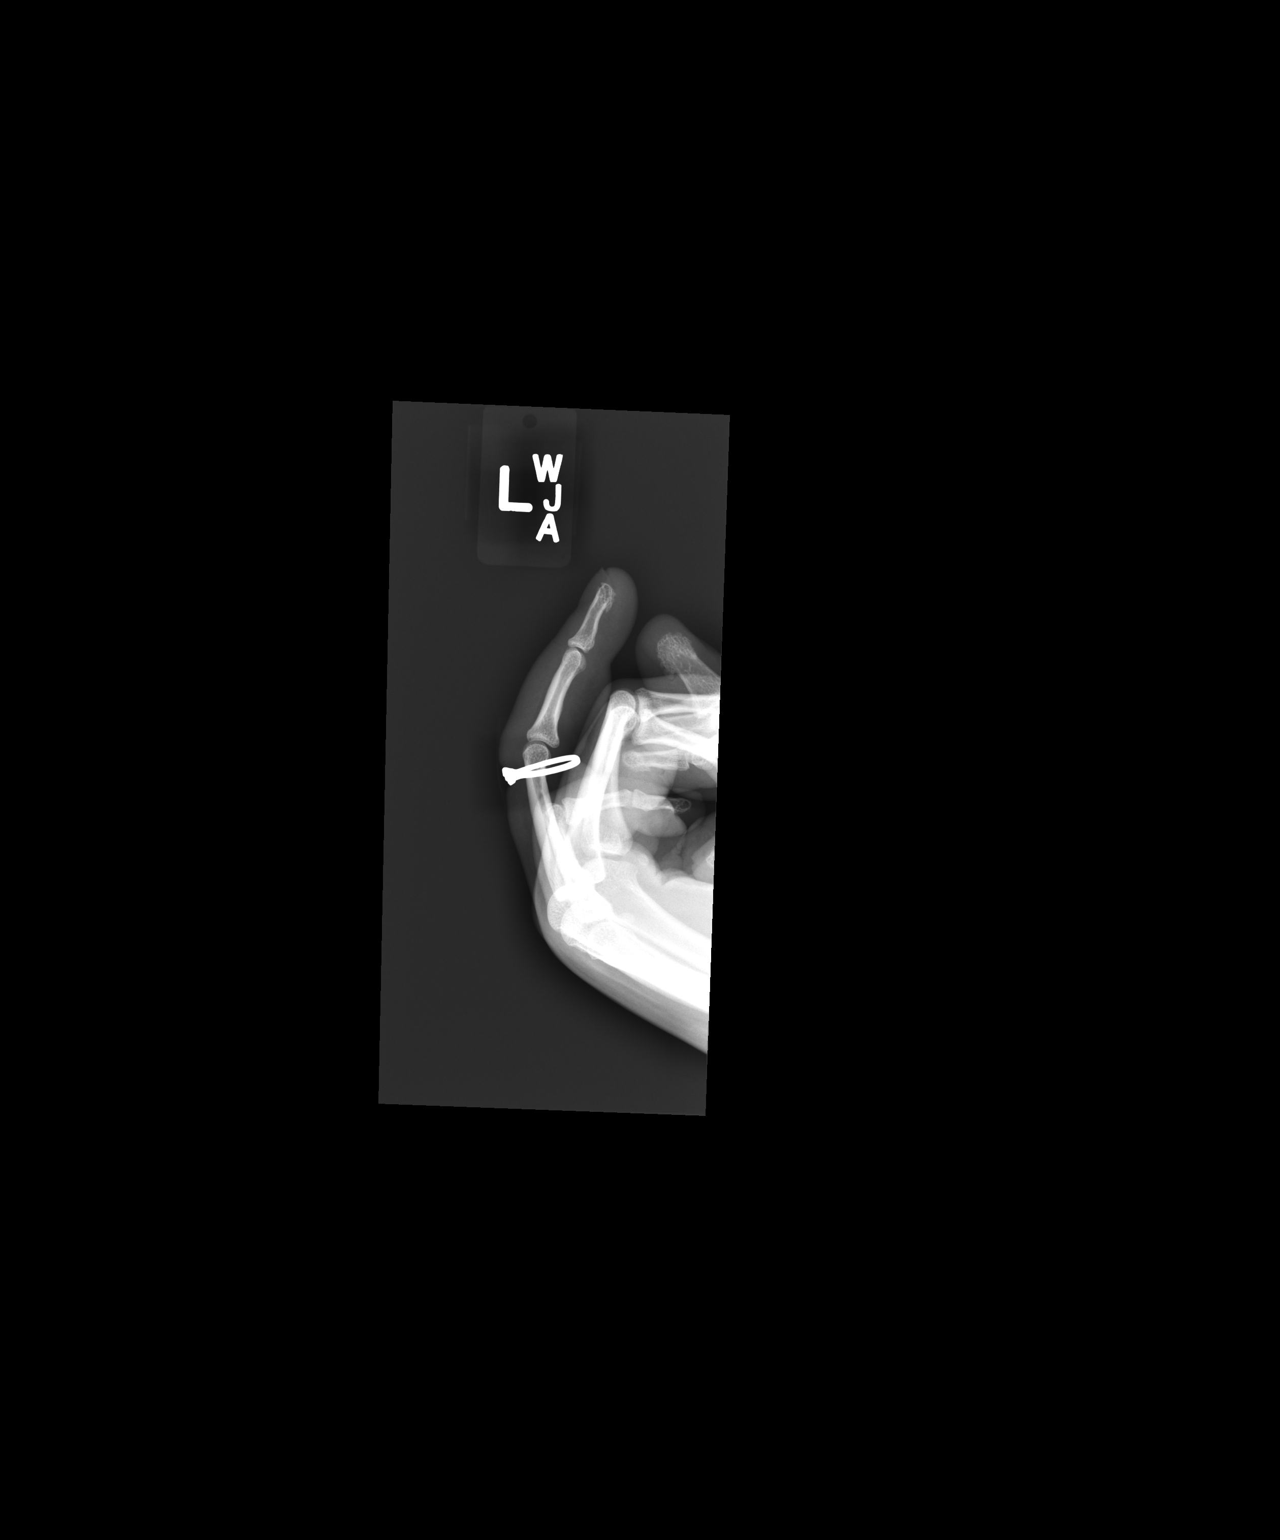

[3 of 3 positions shown; findings below may reference images not displayed]

FINDINGS: There is a ring on the fourth finger. The ring causes indentation of
the soft tissues. Negative for fracture or dislocation. No evidence
for bone destruction or periosteal reaction.
IMPRESSION: No acute bone abnormality.

## 2015-09-17 DIAGNOSIS — S83512A Sprain of anterior cruciate ligament of left knee, initial encounter: Secondary | ICD-10-CM | POA: Diagnosis not present

## 2015-10-27 DIAGNOSIS — N939 Abnormal uterine and vaginal bleeding, unspecified: Secondary | ICD-10-CM | POA: Diagnosis not present

## 2015-11-11 DIAGNOSIS — N83291 Other ovarian cyst, right side: Secondary | ICD-10-CM | POA: Diagnosis not present

## 2015-11-11 DIAGNOSIS — N939 Abnormal uterine and vaginal bleeding, unspecified: Secondary | ICD-10-CM | POA: Diagnosis not present

## 2015-12-22 DIAGNOSIS — N939 Abnormal uterine and vaginal bleeding, unspecified: Secondary | ICD-10-CM | POA: Diagnosis not present

## 2016-03-03 DIAGNOSIS — F2089 Other schizophrenia: Secondary | ICD-10-CM | POA: Diagnosis not present

## 2016-03-09 DIAGNOSIS — F2089 Other schizophrenia: Secondary | ICD-10-CM | POA: Diagnosis not present

## 2016-03-15 DIAGNOSIS — F2089 Other schizophrenia: Secondary | ICD-10-CM | POA: Diagnosis not present

## 2016-03-17 DIAGNOSIS — F2089 Other schizophrenia: Secondary | ICD-10-CM | POA: Diagnosis not present

## 2016-03-23 DIAGNOSIS — Z79899 Other long term (current) drug therapy: Secondary | ICD-10-CM | POA: Diagnosis not present

## 2016-04-01 DIAGNOSIS — R7989 Other specified abnormal findings of blood chemistry: Secondary | ICD-10-CM | POA: Diagnosis not present

## 2016-04-01 DIAGNOSIS — I10 Essential (primary) hypertension: Secondary | ICD-10-CM | POA: Diagnosis not present

## 2016-04-01 DIAGNOSIS — E669 Obesity, unspecified: Secondary | ICD-10-CM | POA: Diagnosis not present

## 2016-04-01 DIAGNOSIS — Z716 Tobacco abuse counseling: Secondary | ICD-10-CM | POA: Diagnosis not present

## 2016-04-01 DIAGNOSIS — E7800 Pure hypercholesterolemia, unspecified: Secondary | ICD-10-CM | POA: Diagnosis not present

## 2016-04-01 DIAGNOSIS — K219 Gastro-esophageal reflux disease without esophagitis: Secondary | ICD-10-CM | POA: Diagnosis not present

## 2016-04-01 DIAGNOSIS — E559 Vitamin D deficiency, unspecified: Secondary | ICD-10-CM | POA: Diagnosis not present

## 2016-04-01 DIAGNOSIS — Z0000 Encounter for general adult medical examination without abnormal findings: Secondary | ICD-10-CM | POA: Diagnosis not present

## 2016-04-01 DIAGNOSIS — D509 Iron deficiency anemia, unspecified: Secondary | ICD-10-CM | POA: Diagnosis not present

## 2016-04-01 DIAGNOSIS — E119 Type 2 diabetes mellitus without complications: Secondary | ICD-10-CM | POA: Diagnosis not present

## 2016-04-01 DIAGNOSIS — Z5320 Procedure and treatment not carried out because of patient's decision for unspecified reasons: Secondary | ICD-10-CM | POA: Diagnosis not present

## 2016-04-01 DIAGNOSIS — R5383 Other fatigue: Secondary | ICD-10-CM | POA: Diagnosis not present

## 2016-04-14 DIAGNOSIS — F2089 Other schizophrenia: Secondary | ICD-10-CM | POA: Diagnosis not present

## 2016-04-28 DIAGNOSIS — Z79899 Other long term (current) drug therapy: Secondary | ICD-10-CM | POA: Diagnosis not present

## 2016-04-29 DIAGNOSIS — Z79891 Long term (current) use of opiate analgesic: Secondary | ICD-10-CM | POA: Diagnosis not present

## 2016-04-29 DIAGNOSIS — L0232 Furuncle of buttock: Secondary | ICD-10-CM | POA: Diagnosis not present

## 2016-04-29 DIAGNOSIS — K5900 Constipation, unspecified: Secondary | ICD-10-CM | POA: Diagnosis not present

## 2016-04-29 DIAGNOSIS — L02223 Furuncle of chest wall: Secondary | ICD-10-CM | POA: Diagnosis not present

## 2016-05-04 ENCOUNTER — Encounter: Payer: Self-pay | Admitting: Emergency Medicine

## 2016-05-04 ENCOUNTER — Emergency Department
Admission: EM | Admit: 2016-05-04 | Discharge: 2016-05-04 | Disposition: A | Discharge status: Home / Self Care | Payer: Medicare Other | Attending: Emergency Medicine | Admitting: Emergency Medicine

## 2016-05-04 DIAGNOSIS — F201 Disorganized schizophrenia: Secondary | ICD-10-CM | POA: Diagnosis present

## 2016-05-04 DIAGNOSIS — F919 Conduct disorder, unspecified: Secondary | ICD-10-CM | POA: Insufficient documentation

## 2016-05-04 DIAGNOSIS — F918 Other conduct disorders: Secondary | ICD-10-CM | POA: Diagnosis not present

## 2016-05-04 DIAGNOSIS — Z87891 Personal history of nicotine dependence: Secondary | ICD-10-CM | POA: Diagnosis not present

## 2016-05-04 DIAGNOSIS — Z79899 Other long term (current) drug therapy: Secondary | ICD-10-CM | POA: Diagnosis not present

## 2016-05-04 DIAGNOSIS — Z046 Encounter for general psychiatric examination, requested by authority: Secondary | ICD-10-CM | POA: Diagnosis present

## 2016-05-04 DIAGNOSIS — R4689 Other symptoms and signs involving appearance and behavior: Secondary | ICD-10-CM

## 2016-05-04 LAB — COMPREHENSIVE METABOLIC PANEL
ALBUMIN: 4.1 g/dL (ref 3.5–5.0)
ALT: 27 U/L (ref 14–54)
ANION GAP: 9 (ref 5–15)
AST: 31 U/L (ref 15–41)
Alkaline Phosphatase: 97 U/L (ref 38–126)
BUN: 11 mg/dL (ref 6–20)
CHLORIDE: 107 mmol/L (ref 101–111)
CO2: 23 mmol/L (ref 22–32)
CREATININE: 0.79 mg/dL (ref 0.44–1.00)
Calcium: 9.2 mg/dL (ref 8.9–10.3)
Glucose, Bld: 92 mg/dL (ref 65–99)
POTASSIUM: 3.4 mmol/L — AB (ref 3.5–5.1)
Sodium: 139 mmol/L (ref 135–145)
Total Bilirubin: 0.1 mg/dL — ABNORMAL LOW (ref 0.3–1.2)
Total Protein: 8.4 g/dL — ABNORMAL HIGH (ref 6.5–8.1)

## 2016-05-04 LAB — SALICYLATE LEVEL

## 2016-05-04 LAB — CBC
HCT: 40.2 % (ref 35.0–47.0)
HEMOGLOBIN: 13.2 g/dL (ref 12.0–16.0)
MCH: 28 pg (ref 26.0–34.0)
MCHC: 32.9 g/dL (ref 32.0–36.0)
MCV: 85 fL (ref 80.0–100.0)
PLATELETS: 332 10*3/uL (ref 150–440)
RBC: 4.73 MIL/uL (ref 3.80–5.20)
RDW: 20.5 % — ABNORMAL HIGH (ref 11.5–14.5)
WBC: 12.6 10*3/uL — AB (ref 3.6–11.0)

## 2016-05-04 LAB — URINE DRUG SCREEN, QUALITATIVE (ARMC ONLY)
AMPHETAMINES, UR SCREEN: NOT DETECTED
Barbiturates, Ur Screen: NOT DETECTED
Benzodiazepine, Ur Scrn: NOT DETECTED
CANNABINOID 50 NG, UR ~~LOC~~: NOT DETECTED
COCAINE METABOLITE, UR ~~LOC~~: NOT DETECTED
MDMA (ECSTASY) UR SCREEN: NOT DETECTED
METHADONE SCREEN, URINE: NOT DETECTED
Opiate, Ur Screen: NOT DETECTED
Phencyclidine (PCP) Ur S: NOT DETECTED
TRICYCLIC, UR SCREEN: NOT DETECTED

## 2016-05-04 LAB — ETHANOL

## 2016-05-04 LAB — ACETAMINOPHEN LEVEL

## 2016-05-04 LAB — POCT PREGNANCY, URINE: PREG TEST UR: NEGATIVE

## 2016-05-04 NOTE — Discharge Instructions (Signed)
Please seek medical attention and help for any thoughts about wanting to harm herself, harm others, any concerning change in behavior, severe depression, inappropriate drug use or any other new or concerning symptoms. ° °

## 2016-05-04 NOTE — Consult Note (Signed)
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 Psychoses [F29]   . 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 Elevated WBC count [D72.829] 04/07/2014  . Noncompliance with medication regimen [Z91.14] 04/07/2014  . Schizoaffective disorder-chronic with exacerbation ([place]) [F25.8] 02/06/2014  . Aggressive behavior [R45.89] 08/16/2013  . Papular rash, generalized [R21] 09/01/2012  . Amenorrhea [N91.2] 04/25/2012  . ASC-cannot exclude HGSIL on Pap [R89.6] 02/15/2012  . Screening for malignant neoplasm of the cervix [Z12.4] 02/01/2012  . 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 Obesity, unspecified [E66.9] 02/10/2009  . TOBACCO USER [F17.200] 02/08/2009  . DIABETES MELLITUS [E11.9] 04/02/2008  . CERVICITIS, GONOCOCCAL, History of [A54.03] 01/09/2007  . TRICHOMONAL VAGINITIS [A59.01] 01/09/2007  . ECZEMA, ATOPIC DERMATITIS [L20.89] 06/30/2006    Total Time spent with patient: 1 hour  Subjective:   Molly [name] is a 31 y.o. female patient admitted with "because they are taking my money".  HPI:  Patient interviewed. Chart reviewed. 31 year old woman brought in from her group home with reports that she had been agitated and possibly running away from the group home. On interview today the patient says she was irritated because she thinks they are stealing her money. She says the people at the group home don't feed her very well and she gets in arguments with them about her food. She denies however having any thoughts of harming herself or any thoughts of harming anybody else. She is not making any threats or behaving in  an aggressive manner. She says that she has been compliant with her clozapine. She denies that she's been using any drugs recently.  Social history: Patient has been living at this current group home for about 4 months after getting out of [facility]. Not clear if she has any family she is in contact with her who her guardian is.  Medical history: Has eczema. Some other minor medical issues nothing else major other than the psychiatric.  Substance abuse history: Patient says that she smoked some crack 2 weeks ago but no other drugs recently and not drinking. Her drug screen is all negative. Commitment paper accuses her of having a drug abuse problem.  Past Psychiatric History: Patient has had a diagnosis of schizophrenia or schizoaffective disorder. Multiple prior hospitalization. Recently had a long hospitalization at [facility] that she's had 1 suicide attempt several years ago. Has a history of some aggression and bizarre behavior but denies other major violence. Says that she is currently cooperative with medicine.  Risk to Self: Is patient at risk for suicide?: No Risk to Others:   Prior Inpatient Therapy:   Prior Outpatient Therapy:    Past Medical History:  Past Medical History:  Diagnosis Date  . Abnormal Pap smear   . ASC-cannot exclude HGSIL on Pap 02/15/2012   ASC-US on 02/03/2012 pap (associated Trichomonas infection). No reflex HPV testing performed on specimen.  Patient informed that she will need repeat Pap in one year.      . ATTENTION DEFICIT, W/O HYPERACTIVITY, History of 06/30/2006   Qualifier: History of  By: McDiarmid MD, Sherren Mocha    . CERVICITIS, GONOCOCCAL, History of 01/09/2007   Qualifier: History of  By: McDiarmid MD, Sherren Mocha    . 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 Depression   . Diabetes mellitus    diet controlled  . Eczema   . Schizophrenia ([place])   . SCHIZOPHRENIA,  CATATONIC, HISTORY OF 12/13/2006   Annotation: Diagnoses by  Dr. Henrene Dodge (Psych) At [facility] in  [place], [place]. Qualifier: Hospitalized for  By: McDiarmid MD, Sherren Mocha    . 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 TOBACCO USER 02/08/2009   Qualifier: Diagnosis of  By: Samara Snide      Past Surgical History:  Procedure Laterality Date  . INCISION AND DRAINAGE     pilanodal cyst   Family History:  Family History  Problem Relation Age of Onset  . Adopted: Yes  . Bipolar disorder Sister   . Alcohol abuse Brother   . Cancer Father   . Diabetes Mother    Family Psychiatric  History: Patient says her brother also has mental illness Social History:  History  Alcohol Use No    Comment: occ     History  Drug Use No    Social History   Social History  . Marital status: Single    Spouse name: N/A  . Number of children: N/A  . Years of education: N/A   Social History Main Topics  . Smoking status: Former Smoker    Packs/day: 0.50    Years: 10.00    Types: Cigarettes  . Smokeless tobacco: Never Used  . Alcohol use No     Comment: occ  . Drug use: No  . Sexual activity: Yes    Birth control/ protection: None   Other Topics Concern  . None   Social History Narrative   Adopted   Living in [place] home with [name]   Transportation: Clinical biochemist Social History:    Allergies:   Allergies  Allergen Reactions  . Abilify [Aripiprazole] Other (See Comments)    Thinks its nasty- does not want it.  Injection is ok.      Labs:  Results for orders placed or performed during the hospital encounter of 05/04/16 (from the past 48 hour(s))  Comprehensive metabolic panel     Status: Abnormal   Collection Time: 05/04/16  5:14 PM  Result Value Ref Range   Sodium 139 135 - 145 mmol/L   Potassium 3.4 (L) 3.5 - 5.1 mmol/L   Chloride 107 101 - 111 mmol/L   CO2 23 22 - 32 mmol/L   Glucose, Bld 92 65 -  99 mg/dL   BUN 11 6 - 20 mg/dL   Creatinine, Ser 0.79 0.44 - 1.00 mg/dL   Calcium 9.2 8.9 - 10.3 mg/dL   Total Protein 8.4 (H) 6.5 - 8.1 g/dL   Albumin 4.1 3.5 - 5.0 g/dL   AST 31 15 - 41 U/L   ALT 27 14 - 54 U/L   Alkaline Phosphatase 97 38 - 126 U/L   Total Bilirubin 0.1 (L) 0.3 - 1.2 mg/dL   GFR calc non Af Amer >60 >60 mL/min   GFR calc Af Amer >60 >60 mL/min    Comment: (NOTE) The eGFR has been calculated using the CKD EPI equation. This calculation has not been validated in all clinical situations. eGFR's persistently <60 mL/min signify possible Chronic Kidney Disease.    Anion gap 9 5 -  15  Ethanol     Status: None   Collection Time: 05/04/16  5:14 PM  Result Value Ref Range   Alcohol, Ethyl (B) <5 <5 mg/dL    Comment:        LOWEST DETECTABLE LIMIT FOR SERUM ALCOHOL IS 5 mg/dL FOR MEDICAL PURPOSES ONLY   Salicylate level     Status: None   Collection Time: 05/04/16  5:14 PM  Result Value Ref Range   Salicylate Lvl <8.4 2.8 - 30.0 mg/dL  Acetaminophen level     Status: Abnormal   Collection Time: 05/04/16  5:14 PM  Result Value Ref Range   Acetaminophen (Tylenol), Serum <10 (L) 10 - 30 ug/mL    Comment:        THERAPEUTIC CONCENTRATIONS VARY SIGNIFICANTLY. A RANGE OF 10-30 ug/mL MAY BE AN EFFECTIVE CONCENTRATION FOR MANY PATIENTS. HOWEVER, SOME ARE BEST TREATED AT CONCENTRATIONS OUTSIDE THIS RANGE. ACETAMINOPHEN CONCENTRATIONS >150 ug/mL AT 4 HOURS AFTER INGESTION AND >50 ug/mL AT 12 HOURS AFTER INGESTION ARE OFTEN ASSOCIATED WITH TOXIC REACTIONS.   cbc     Status: Abnormal   Collection Time: 05/04/16  5:14 PM  Result Value Ref Range   WBC 12.6 (H) 3.6 - 11.0 K/uL   RBC 4.73 3.80 - 5.20 MIL/uL   Hemoglobin 13.2 12.0 - 16.0 g/dL   HCT 40.2 35.0 - 47.0 %   MCV 85.0 80.0 - 100.0 fL   MCH 28.0 26.0 - 34.0 pg   MCHC 32.9 32.0 - 36.0 g/dL   RDW 20.5 (H) 11.5 - 14.5 %   Platelets 332 150 - 440 K/uL  Urine Drug Screen, Qualitative     Status: None    Collection Time: 05/04/16  5:14 PM  Result Value Ref Range   Tricyclic, Ur Screen NONE DETECTED NONE DETECTED   Amphetamines, Ur Screen NONE DETECTED NONE DETECTED   MDMA (Ecstasy)Ur Screen NONE DETECTED NONE DETECTED   Cocaine Metabolite,Ur [place] NONE DETECTED NONE DETECTED   Opiate, Ur Screen NONE DETECTED NONE DETECTED   Phencyclidine (PCP) Ur S NONE DETECTED NONE DETECTED   Cannabinoid 50 Ng, Ur [place] NONE DETECTED NONE DETECTED   Barbiturates, Ur Screen NONE DETECTED NONE DETECTED   Benzodiazepine, Ur Scrn NONE DETECTED NONE DETECTED   Methadone Scn, Ur NONE DETECTED NONE DETECTED    Comment: (NOTE) 665  Tricyclics, urine               Cutoff 1000 ng/mL 200  Amphetamines, urine             Cutoff 1000 ng/mL 300  MDMA (Ecstasy), urine           Cutoff 500 ng/mL 400  Cocaine Metabolite, urine       Cutoff 300 ng/mL 500  Opiate, urine                   Cutoff 300 ng/mL 600  Phencyclidine (PCP), urine      Cutoff 25 ng/mL 700  Cannabinoid, urine              Cutoff 50 ng/mL 800  Barbiturates, urine             Cutoff 200 ng/mL 900  Benzodiazepine, urine           Cutoff 200 ng/mL 1000 Methadone, urine                Cutoff 300 ng/mL 1100 1200 The urine drug screen provides only a preliminary, unconfirmed 1300 analytical test  result and should not be used for non-medical 1400 purposes. Clinical consideration and professional judgment should 1500 be applied to any positive drug screen result due to possible 1600 interfering substances. A more specific alternate chemical method 1700 must be used in order to obtain a confirmed analytical result.  1800 Gas chromato graphy / mass spectrometry (GC/MS) is the preferred 1900 confirmatory method.   Pregnancy, urine POC     Status: None   Collection Time: 05/04/16  5:19 PM  Result Value Ref Range   Preg Test, Ur NEGATIVE NEGATIVE    Comment:        THE SENSITIVITY OF THIS METHODOLOGY IS >24 mIU/mL     No current facility-administered  medications for this encounter.    Current Outpatient Prescriptions  Medication Sig Dispense Refill  . acetaminophen (TYLENOL) 500 MG tablet Take 500 mg by mouth every 6 (six) hours as needed for headache (headache).    . benztropine (COGENTIN) 0.5 MG tablet Take 1 tablet (0.5 mg total) by mouth 2 (two) times daily. (Patient not taking: Reported on 12/09/2014) 60 tablet 0  . benztropine mesylate (COGENTIN) 1 MG/ML injection Inject 0.5 mLs (0.5 mg total) into the muscle every 30 (thirty) days. First dose received on 04/16/15 with next dose due in thirty days. (Patient not taking: Reported on 12/09/2014) 2 mL 0  . divalproex (DEPAKOTE ER) 250 MG 24 hr tablet Take 3 tablets (750 mg total) by mouth 2 (two) times daily. (Patient not taking: Reported on 12/09/2014) 180 tablet 0  . doxycycline (VIBRAMYCIN) 100 MG capsule Take 1 capsule (100 mg total) by mouth 2 (two) times daily. One po bid x 7 days 14 capsule 0  . haloperidol decanoate (HALDOL DECANOATE) 100 MG/ML injection Inject 1 mL (100 mg total) into the muscle every 30 (thirty) days. (Patient not taking: Reported on 05/02/2014) 1 mL 0  . nitrofurantoin, macrocrystal-monohydrate, (MACROBID) 100 MG capsule Take 1 capsule (100 mg total) by mouth 2 (two) times daily. (Patient not taking: Reported on 08/23/2014) 10 capsule 0  . Prenatal Vit-Fe Fumarate-FA (MULTIVITAMIN-PRENATAL) 27-0.8 MG TABS tablet Take 1 tablet by mouth daily at 12 noon.    Marland Kitchen RisperiDONE (RISPERDAL PO) Take 1 tablet by mouth daily.    . traZODone (DESYREL) 100 MG tablet Take 1 tablet (100 mg total) by mouth at bedtime. (Patient not taking: Reported on 12/09/2014) 30 tablet 0    Musculoskeletal: Strength & Muscle Tone: within normal limits Gait & Station: normal Patient leans: N/A  Psychiatric Specialty Exam: Physical Exam  Nursing note and vitals reviewed. Constitutional: She appears well-developed and well-nourished.  HENT:  Head: Normocephalic and atraumatic.  Eyes: Conjunctivae  are normal. Pupils are equal, round, and reactive to light.  Neck: Normal range of motion.  Cardiovascular: Regular rhythm and normal heart sounds.   Respiratory: Effort normal. No respiratory distress.  GI: Soft.  Musculoskeletal: Normal range of motion.  Neurological: She is alert.  Skin: Skin is warm and dry.  Psychiatric: She has a normal mood and affect. Her speech is normal and behavior is normal. Thought content is delusional. Cognition and memory are normal. She expresses impulsivity. She expresses no homicidal and no suicidal ideation.    Review of Systems  Constitutional: Negative.   HENT: Negative.   Eyes: Negative.   Respiratory: Negative.   Cardiovascular: Negative.   Gastrointestinal: Negative.   Musculoskeletal: Negative.   Skin: Negative.   Neurological: Negative.   Psychiatric/Behavioral: Negative.     Blood pressure 129/90, pulse 98,  temperature 99.2 F (37.3 C), temperature source Oral, resp. rate (!) 25, height _0  (1.702 m), weight 136.1 kg (300 lb), last menstrual period 09/01/2015, SpO2 100 %, unknown if currently breastfeeding.Body mass index is 46.99 kg/m.  General Appearance: Casual  Eye Contact:  Good  Speech:  Garbled  Volume:  Normal  Mood:  Euthymic  Affect:  Constricted  Thought Process:  Disorganized  Orientation:  Full (Time, Place, and Person)  Thought Content:  Illogical  Suicidal Thoughts:  No  Homicidal Thoughts:  No  Memory:  Immediate;   Good Recent;   Fair Remote;   Fair  Judgement:  Impaired  Insight:  Shallow  Psychomotor Activity:  Normal  Concentration:  Concentration: Poor  Recall:  [employer] of Knowledge:  Fair  Language:  Fair  Akathisia:  No  Handed:  Right  AIMS (if indicated):     Assets:  Communication Skills Desire for Improvement Financial Resources/Insurance Housing Resilience  ADL's:  Intact  Cognition:  WNL  Sleep:        Treatment Plan Summary: Daily contact with patient to assess and evaluate  symptoms and progress in treatment, Medication management and Plan 31 year old woman with schizophrenia or schizoaffective disorder brought in from her group home. Patient does have some psychotic symptoms and disorganized thinking but appears to probably be at her baseline. No evidence of acute aggression violence or suicidality. Does not meet commitment criteria. Patient can be taken off of IVC and referred back to her group home with follow-up in the community. Commitment paper discontinued. Case reviewed with TTS and emergency room physician. Counseling provided to the patient to stay away from abusing drugs and make sure she stays compliant with her medication.  Disposition: Patient does not meet criteria for psychiatric inpatient admission. Supportive therapy provided about ongoing stressors.  Alethia Berthold, MD 05/04/2016 6:46 PM

## 2016-05-04 NOTE — ED Notes (Signed)
Pt discharged to home.  Caregiver driving.  Discharge instructions reviewed with pt and caregiver.  Verbalized understanding.  No questions or concerns at this time.  Teach back verified.  Pt in NAD.  No items left in ED.   

## 2016-05-04 NOTE — ED Triage Notes (Addendum)
[facility] [place]. Patient states her group home refused her a phone call, patient states she then went door to door asking for a phone. Patient states she almost got into a fight with another resident. Pressured speech in triage. Patient denies HI or SI. Per officer patient was combative with staff at group home. Patient has been taking her medication regularly but staff think it is no long effective.

## 2016-05-04 NOTE — ED Notes (Signed)
Patient is under IVC 

## 2016-05-04 NOTE — ED Provider Notes (Signed)
[facility] Emergency Department Provider Note   ____________________________________________   I have reviewed the triage vital signs and the nursing notes.   HISTORY  Chief Complaint Mental Health Problem   History limited by: Not Limited   HPI Molly [name] is a 31 y.o. female who presents to the emergency department today from group home because of concern for abnormal behavior. The patient states that she ran out of her group home. She did this because he would not give her the cell phone. The time of my examination the patient states that she feels calmer. She no longer is upset. She never had any thoughts wanting hurt herself. Denies any recent illness.   Past Medical History:  Diagnosis Date  . Abnormal Pap smear   . ASC-cannot exclude HGSIL on Pap 02/15/2012   ASC-US on 02/03/2012 pap (associated Trichomonas infection). No reflex HPV testing performed on specimen.  Patient informed that she will need repeat Pap in one year.      . ATTENTION DEFICIT, W/O HYPERACTIVITY, History of 06/30/2006   Qualifier: History of  By: McDiarmid MD, Tawanna Cooler    . CERVICITIS, GONOCOCCAL, History of 01/09/2007   Qualifier: History of  By: McDiarmid MD, Tawanna Cooler    . 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 Depression   . Diabetes mellitus    diet controlled  . Eczema   . Schizophrenia (H[facility])   . 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 Dennie Bible (Psych) At [facility] in  [name], [place]. Qualifier: Hospitalized for  By: McDiarmid MD, Tawanna Cooler    . 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 TOBACCO USER 02/08/2009   Qualifier: Diagnosis of  By: Knox Royalty      Patient Active Problem List   Diagnosis Date Noted  . Disorganized schizophrenia (HCC)   . Psychoses   . Borderline intellectual functioning   . Elevated WBC  count 04/07/2014  . Noncompliance with medication regimen 04/07/2014  . Schizoaffective disorder-chronic with exacerbation (H[facility]) 02/06/2014  . Aggressive behavior 08/16/2013  . Papular rash, generalized 09/01/2012  . Amenorrhea 04/25/2012  . ASC-cannot exclude HGSIL on Pap 02/15/2012  . Screening for malignant neoplasm of the cervix 02/01/2012  . 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 Obesity, unspecified 02/10/2009  . TOBACCO USER 02/08/2009  . DIABETES MELLITUS 04/02/2008  . CERVICITIS, GONOCOCCAL, History of 01/09/2007  . TRICHOMONAL VAGINITIS 01/09/2007  . ECZEMA, ATOPIC DERMATITIS 06/30/2006    Past Surgical History:  Procedure Laterality Date  . INCISION AND DRAINAGE     pilanodal cyst    Prior to Admission medications   Medication Sig Start Date End Date Taking? Authorizing Provider  acetaminophen (TYLENOL) 500 MG tablet Take 500 mg by mouth every 6 (six) hours as needed for headache (headache).    Historical Provider, MD  benztropine (COGENTIN) 0.5 MG tablet Take 1 tablet (0.5 mg total) by mouth 2 (two) times daily. Patient not taking: Reported on 12/09/2014 04/17/14   Thermon Leyland, NP  benztropine mesylate (COGENTIN) 1 MG/ML injection Inject 0.5 mLs (0.5 mg total) into the muscle every 30 (thirty) days. First dose received on 04/16/15 with next dose due in thirty days. Patient not taking: Reported on 12/09/2014 05/16/14   Thermon Leyland, NP  divalproex (DEPAKOTE ER) 250 MG 24 hr tablet Take 3 tablets (  750 mg total) by mouth 2 (two) times daily. Patient not taking: Reported on 12/09/2014 04/17/14   Thermon LeylandLaura A Davis, NP  doxycycline (VIBRAMYCIN) 100 MG capsule Take 1 capsule (100 mg total) by mouth 2 (two) times daily. One po bid x 7 days 01/09/15   Loren Raceravid Yelverton, MD  haloperidol decanoate (HALDOL DECANOATE) 100 MG/ML injection Inject 1 mL (100 mg total) into the muscle every 30 (thirty) days. Patient not taking: Reported on 05/02/2014 05/16/14   Thermon LeylandLaura A Davis, NP   nitrofurantoin, macrocrystal-monohydrate, (MACROBID) 100 MG capsule Take 1 capsule (100 mg total) by mouth 2 (two) times daily. Patient not taking: Reported on 08/23/2014 08/06/14   Charlestine Nighthristopher Lawyer, PA-C  Prenatal Vit-Fe Fumarate-FA (MULTIVITAMIN-PRENATAL) 27-0.8 MG TABS tablet Take 1 tablet by mouth daily at 12 noon.    Historical Provider, MD  RisperiDONE (RISPERDAL PO) Take 1 tablet by mouth daily.    Historical Provider, MD  traZODone (DESYREL) 100 MG tablet Take 1 tablet (100 mg total) by mouth at bedtime. Patient not taking: Reported on 12/09/2014 04/17/14   Thermon LeylandLaura A Davis, NP    Allergies Abilify [aripiprazole]  Family History  Problem Relation Age of Onset  . Adopted: Yes  . Bipolar disorder Sister   . Alcohol abuse Brother   . Cancer Father   . Diabetes Mother     Social History Social History  Substance Use Topics  . Smoking status: Former Smoker    Packs/day: 0.50    Years: 10.00    Types: Cigarettes  . Smokeless tobacco: Never Used  . Alcohol use No     Comment: occ    Review of Systems  Constitutional: Negative for fever. Cardiovascular: Negative for chest pain. Respiratory: Negative for shortness of breath. Gastrointestinal: Negative for abdominal pain, vomiting and diarrhea. Genitourinary: Negative for dysuria. Musculoskeletal: Negative for back pain. Skin: Negative for rash. Neurological: Negative for headaches, focal weakness or numbness.  10-point ROS otherwise negative.  ____________________________________________   PHYSICAL EXAM:  VITAL SIGNS: ED Triage Vitals  Enc Vitals Group     BP 05/04/16 1706 129/90     Pulse Rate 05/04/16 1706 98     Resp 05/04/16 1706 (!) 25     Temp 05/04/16 1706 99.2 F (37.3 C)     Temp Source 05/04/16 1706 Oral     SpO2 05/04/16 1706 100 %     Weight 05/04/16 1708 300 lb (136.1 kg)     Height 05/04/16 1708 5\' 7"  (1.702 m)   Constitutional: Alert and oriented. Well appearing and in no distress. Eyes:  Conjunctivae are normal. Normal extraocular movements. ENT   Head: Normocephalic and atraumatic.   Nose: No congestion/rhinnorhea.   Mouth/Throat: Mucous membranes are moist.   Neck: No stridor. Hematological/Lymphatic/Immunilogical: No cervical lymphadenopathy. Cardiovascular: Normal rate, regular rhythm.  No murmurs, rubs, or gallops.  Respiratory: Normal respiratory effort without tachypnea nor retractions. Breath sounds are clear and equal bilaterally. No wheezes/rales/rhonchi. Gastrointestinal: Soft and non tender. No rebound. No guarding.  Genitourinary: Deferred Musculoskeletal: Normal range of motion in all extremities. No lower extremity edema. Neurologic:  Normal speech and language. No gross focal neurologic deficits are appreciated.  Skin:  Skin is warm, dry and intact. No rash noted.  ____________________________________________    LABS (pertinent positives/negatives)  Labs Reviewed  COMPREHENSIVE METABOLIC PANEL - Abnormal; Notable for the following:       Result Value   Potassium 3.4 (*)    Total Protein 8.4 (*)    Total Bilirubin 0.1 (*)  All other components within normal limits  ACETAMINOPHEN LEVEL - Abnormal; Notable for the following:    Acetaminophen (Tylenol), Serum <10 (*)    All other components within normal limits  CBC - Abnormal; Notable for the following:    WBC 12.6 (*)    RDW 20.5 (*)    All other components within normal limits  ETHANOL  SALICYLATE LEVEL  URINE DRUG SCREEN, QUALITATIVE ([facility] ONLY)  POC URINE PREG, ED  POCT PREGNANCY, URINE     ____________________________________________   EKG  None  ____________________________________________    RADIOLOGY  None  ____________________________________________   PROCEDURES  Procedures  ____________________________________________   INITIAL IMPRESSION / ASSESSMENT AND PLAN / ED COURSE  Pertinent labs & imaging results that were available during my care of  the patient were reviewed by me and considered in my medical decision making (see chart for details).  Patient presented to the emergency department today because of concerns for abnormal behavior. She did come in IVC however Dr. Toni Amend with psychiatry evaluated the patient rescinded the IVC. Patient was very calm on my exam. We will discharge back to group home.  ____________________________________________   FINAL CLINICAL IMPRESSION(S) / ED DIAGNOSES  Final diagnoses:  Abnormal behavior     Note: This dictation was prepared with Dragon dictation. Any transcriptional errors that result from this process are unintentional     Phineas Semen, MD 05/04/16 1845

## 2016-05-07 ENCOUNTER — Emergency Department
Admission: EM | Admit: 2016-05-07 | Discharge: 2016-05-07 | Disposition: A | Discharge status: Home / Self Care | Payer: Medicare Other | Attending: Emergency Medicine | Admitting: Emergency Medicine

## 2016-05-07 ENCOUNTER — Encounter: Payer: Self-pay | Admitting: Emergency Medicine

## 2016-05-07 DIAGNOSIS — Z87891 Personal history of nicotine dependence: Secondary | ICD-10-CM | POA: Diagnosis not present

## 2016-05-07 DIAGNOSIS — F918 Other conduct disorders: Secondary | ICD-10-CM | POA: Insufficient documentation

## 2016-05-07 DIAGNOSIS — R4689 Other symptoms and signs involving appearance and behavior: Secondary | ICD-10-CM

## 2016-05-07 DIAGNOSIS — Z79899 Other long term (current) drug therapy: Secondary | ICD-10-CM | POA: Diagnosis not present

## 2016-05-07 DIAGNOSIS — F201 Disorganized schizophrenia: Secondary | ICD-10-CM | POA: Diagnosis not present

## 2016-05-07 LAB — URINE DRUG SCREEN, QUALITATIVE (ARMC ONLY)
AMPHETAMINES, UR SCREEN: NOT DETECTED
BENZODIAZEPINE, UR SCRN: NOT DETECTED
Barbiturates, Ur Screen: NOT DETECTED
Cannabinoid 50 Ng, Ur ~~LOC~~: NOT DETECTED
Cocaine Metabolite,Ur ~~LOC~~: NOT DETECTED
MDMA (ECSTASY) UR SCREEN: NOT DETECTED
METHADONE SCREEN, URINE: NOT DETECTED
Opiate, Ur Screen: NOT DETECTED
PHENCYCLIDINE (PCP) UR S: NOT DETECTED
TRICYCLIC, UR SCREEN: NOT DETECTED

## 2016-05-07 LAB — COMPREHENSIVE METABOLIC PANEL
ALT: 21 U/L (ref 14–54)
ANION GAP: 5 (ref 5–15)
AST: 25 U/L (ref 15–41)
Albumin: 4.2 g/dL (ref 3.5–5.0)
Alkaline Phosphatase: 79 U/L (ref 38–126)
BILIRUBIN TOTAL: 0.6 mg/dL (ref 0.3–1.2)
BUN: 6 mg/dL (ref 6–20)
CHLORIDE: 111 mmol/L (ref 101–111)
CO2: 23 mmol/L (ref 22–32)
Calcium: 9.1 mg/dL (ref 8.9–10.3)
Creatinine, Ser: 0.54 mg/dL (ref 0.44–1.00)
Glucose, Bld: 79 mg/dL (ref 65–99)
POTASSIUM: 3.7 mmol/L (ref 3.5–5.1)
Sodium: 139 mmol/L (ref 135–145)
TOTAL PROTEIN: 8.3 g/dL — AB (ref 6.5–8.1)

## 2016-05-07 LAB — CBC WITH DIFFERENTIAL/PLATELET
BASOS ABS: 0.1 10*3/uL (ref 0–0.1)
Basophils Relative: 1 %
EOS PCT: 1 %
Eosinophils Absolute: 0.1 10*3/uL (ref 0–0.7)
HEMATOCRIT: 42 % (ref 35.0–47.0)
Hemoglobin: 13.7 g/dL (ref 12.0–16.0)
LYMPHS PCT: 40 %
Lymphs Abs: 4.3 10*3/uL — ABNORMAL HIGH (ref 1.0–3.6)
MCH: 28 pg (ref 26.0–34.0)
MCHC: 32.7 g/dL (ref 32.0–36.0)
MCV: 85.7 fL (ref 80.0–100.0)
MONO ABS: 1 10*3/uL — AB (ref 0.2–0.9)
MONOS PCT: 9 %
NEUTROS ABS: 5.2 10*3/uL (ref 1.4–6.5)
Neutrophils Relative %: 49 %
PLATELETS: 332 10*3/uL (ref 150–440)
RBC: 4.9 MIL/uL (ref 3.80–5.20)
RDW: 20.5 % — AB (ref 11.5–14.5)
WBC: 10.7 10*3/uL (ref 3.6–11.0)

## 2016-05-07 LAB — ETHANOL

## 2016-05-07 LAB — PREGNANCY, URINE: PREG TEST UR: NEGATIVE

## 2016-05-07 NOTE — ED Notes (Signed)
[facility]  "I have been sitting in this lobby forever and I want to know what is taking you so long."  I informed her that the pt is getting dressed and she will be right out."
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
## 2016-05-07 NOTE — Progress Notes (Signed)
DO NOT CALL GROUP HOME ON FACE SHEET -Wrong NUMBER   [facility] [place] [address] Phone [phone] [facility](Home)   This is someone's personal phone number and is not connected to this patient.  [employer] LinesClaudine Leslee Haueter [name] [phone]

## 2016-05-07 NOTE — ED Notes (Signed)

## 2016-05-07 NOTE — BH Assessment (Signed)
Writer spoke ER MD (Dr. Paduchowski), TTS consult discontinued. 

## 2016-05-07 NOTE — ED Notes (Signed)
Charted on wrong patient

## 2016-05-07 NOTE — Progress Notes (Signed)
[name] consulted with psychiatrist and was informed that the patient will be discharged and can return home.  [name] called [facility] and left a message   [facility] [place] [address] Phone [phone] (Home)   . Awaiting a call back.   [employer] Air LinesClaudine Aurianna Earlywine [name] [phone]

## 2016-05-07 NOTE — ED Notes (Signed)
Received a call from registration that the caregiver is here to pick this pt up  - I have returned her clothes and she is getting dressed

## 2016-05-07 NOTE — ED Notes (Signed)
Patient clothing, 1 necklace, 3 bracelets, purse, and papers brought with patient placed in patient belongings bag and labeled.

## 2016-05-07 NOTE — ED Notes (Signed)
BEHAVIORAL HEALTH ROUNDING Patient sleeping: No. Patient alert and oriented: yes Behavior appropriate: Yes.  ; If no, describe:  Nutrition and fluids offered: yes Toileting and hygiene offered: Yes  Sitter present: q15 minute observations and security monitoring Law enforcement present: Yes  ODS  Meal tray provided along with an extra drink

## 2016-05-07 NOTE — ED Notes (Signed)
IVC  PAPERWORK  RESCINDED  PER  DR  CLAPACS  INFORMED  RN  AMY

## 2016-05-07 NOTE — ED Notes (Signed)
Per MD Clapacs - pt is to be discharged back to her group home - she has been medically cleared by the EDP  I have called the group home administrator  Marliss CzarCheryl Crisp  [phone] and informed her of ms Neals pending discharge   Marliss CzarCheryl Crisp replied  "i am at an appointment and I cannot come to get her right now."   I asked for an eta and she stated  "I do not know what time I will be there - I have things to do - I don't have time to pick her up - Did y'all do your job - she need medicine."  I informed her that two MD's have consulted with ms Jennette KettleNeal and the referral is to discharge her back to her group home   Marliss CzarCheryl Crisp laughed out loud and told me  "Jerrye NobleY'all never do your jobs that why she keeps coming back."   Marliss CzarCheryl Crisp was informed of follow up information and I encouraged her to do so  Child psychotherapistocial worker informed of pending discharge and she will attempt to call Marliss Czarheryl Crisp back

## 2016-05-07 NOTE — ED Notes (Signed)
Patient dressed out by this RN and Waldo LaineKaitlin, RCharity fundraiser
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
## 2016-05-12 DIAGNOSIS — F2089 Other schizophrenia: Secondary | ICD-10-CM | POA: Diagnosis not present

## 2016-05-13 DIAGNOSIS — Z79899 Other long term (current) drug therapy: Secondary | ICD-10-CM | POA: Diagnosis not present

## 2016-05-26 ENCOUNTER — Encounter (HOSPITAL_COMMUNITY): Payer: Self-pay | Admitting: Emergency Medicine

## 2016-05-26 ENCOUNTER — Emergency Department (HOSPITAL_COMMUNITY)
Admission: EM | Admit: 2016-05-26 | Discharge: 2016-05-26 | Disposition: A | Discharge status: Home / Self Care | Payer: Medicare Other | Attending: Emergency Medicine | Admitting: Emergency Medicine

## 2016-05-26 DIAGNOSIS — Z5321 Procedure and treatment not carried out due to patient leaving prior to being seen by health care provider: Secondary | ICD-10-CM | POA: Diagnosis not present

## 2016-05-26 DIAGNOSIS — Z590 Homelessness: Secondary | ICD-10-CM | POA: Insufficient documentation

## 2016-05-26 DIAGNOSIS — N898 Other specified noninflammatory disorders of vagina: Secondary | ICD-10-CM | POA: Diagnosis not present

## 2016-05-26 LAB — URINALYSIS, ROUTINE W REFLEX MICROSCOPIC
BILIRUBIN URINE: NEGATIVE
GLUCOSE, UA: NEGATIVE mg/dL
Hgb urine dipstick: NEGATIVE
Ketones, ur: 20 mg/dL — AB
NITRITE: NEGATIVE
PH: 6 (ref 5.0–8.0)
Protein, ur: 30 mg/dL — AB
SPECIFIC GRAVITY, URINE: 1.027 (ref 1.005–1.030)

## 2016-05-26 LAB — CBC WITH DIFFERENTIAL/PLATELET
BASOS PCT: 0 %
Basophils Absolute: 0 10*3/uL (ref 0.0–0.1)
Eosinophils Absolute: 0.1 10*3/uL (ref 0.0–0.7)
Eosinophils Relative: 1 %
HEMATOCRIT: 34.8 % — AB (ref 36.0–46.0)
HEMOGLOBIN: 11.6 g/dL — AB (ref 12.0–15.0)
LYMPHS ABS: 3.5 10*3/uL (ref 0.7–4.0)
Lymphocytes Relative: 28 %
MCH: 28.2 pg (ref 26.0–34.0)
MCHC: 33.3 g/dL (ref 30.0–36.0)
MCV: 84.7 fL (ref 78.0–100.0)
MONOS PCT: 6 %
Monocytes Absolute: 0.8 10*3/uL (ref 0.1–1.0)
NEUTROS ABS: 8.1 10*3/uL — AB (ref 1.7–7.7)
Neutrophils Relative %: 65 %
Platelets: 270 10*3/uL (ref 150–400)
RBC: 4.11 MIL/uL (ref 3.87–5.11)
RDW: 17.7 % — ABNORMAL HIGH (ref 11.5–15.5)
WBC: 12.5 10*3/uL — ABNORMAL HIGH (ref 4.0–10.5)

## 2016-05-26 LAB — RAPID URINE DRUG SCREEN, HOSP PERFORMED
Amphetamines: NOT DETECTED
BARBITURATES: NOT DETECTED
Benzodiazepines: POSITIVE — AB
Cocaine: NOT DETECTED
Opiates: NOT DETECTED
Tetrahydrocannabinol: NOT DETECTED

## 2016-05-26 LAB — COMPREHENSIVE METABOLIC PANEL
ALBUMIN: 3.7 g/dL (ref 3.5–5.0)
ALK PHOS: 78 U/L (ref 38–126)
ALT: 23 U/L (ref 14–54)
ANION GAP: 12 (ref 5–15)
AST: 47 U/L — ABNORMAL HIGH (ref 15–41)
BUN: 8 mg/dL (ref 6–20)
CALCIUM: 9.1 mg/dL (ref 8.9–10.3)
CO2: 19 mmol/L — ABNORMAL LOW (ref 22–32)
Chloride: 108 mmol/L (ref 101–111)
Creatinine, Ser: 0.71 mg/dL (ref 0.44–1.00)
GFR calc Af Amer: >60 mL/min (ref >60)
GFR calc non Af Amer: >60 mL/min (ref >60)
GLUCOSE: 91 mg/dL (ref 65–99)
Potassium: 3.8 mmol/L (ref 3.5–5.1)
Sodium: 139 mmol/L (ref 135–145)
TOTAL PROTEIN: 7.2 g/dL (ref 6.5–8.1)
Total Bilirubin: 0.4 mg/dL (ref 0.3–1.2)

## 2016-05-26 LAB — ETHANOL: Alcohol, Ethyl (B): <5 mg/dL (ref <5)

## 2016-05-26 LAB — HCG, QUANTITATIVE, PREGNANCY: hCG, Beta Chain, Quant, S: 1 m[IU]/mL (ref <5)

## 2016-05-26 NOTE — ED Triage Notes (Signed)
Pt. arrived with [employer] officers homeless and unable to find a shelter this evening , pt. also reported vaginal discharge " my water broke " , pt. presents with flight of ideas / auditory hallucinations , denies suicidal ideations , [employer] reported that she has been missing for 2 days from her group home at [place] .

## 2016-05-26 NOTE — ED Notes (Signed)
Pt refusing blood work. Only IV

## 2016-05-26 NOTE — ED Notes (Signed)
Pt left the ed stating that she was going to wait on the bus.

## 2016-05-27 ENCOUNTER — Emergency Department (HOSPITAL_COMMUNITY)
Admission: EM | Admit: 2016-05-27 | Discharge: 2016-05-29 | Disposition: A | Discharge status: Home / Self Care | Payer: Medicare Other | Attending: Emergency Medicine | Admitting: Emergency Medicine

## 2016-05-27 ENCOUNTER — Encounter (HOSPITAL_COMMUNITY): Payer: Self-pay | Admitting: Emergency Medicine

## 2016-05-27 DIAGNOSIS — Z888 Allergy status to other drugs, medicaments and biological substances status: Secondary | ICD-10-CM | POA: Diagnosis not present

## 2016-05-27 DIAGNOSIS — E119 Type 2 diabetes mellitus without complications: Secondary | ICD-10-CM | POA: Insufficient documentation

## 2016-05-27 DIAGNOSIS — F258 Other schizoaffective disorders: Secondary | ICD-10-CM | POA: Diagnosis not present

## 2016-05-27 DIAGNOSIS — Z79899 Other long term (current) drug therapy: Secondary | ICD-10-CM | POA: Diagnosis not present

## 2016-05-27 DIAGNOSIS — R4183 Borderline intellectual functioning: Secondary | ICD-10-CM | POA: Diagnosis not present

## 2016-05-27 DIAGNOSIS — F918 Other conduct disorders: Secondary | ICD-10-CM | POA: Insufficient documentation

## 2016-05-27 DIAGNOSIS — F259 Schizoaffective disorder, unspecified: Secondary | ICD-10-CM | POA: Diagnosis present

## 2016-05-27 DIAGNOSIS — R451 Restlessness and agitation: Secondary | ICD-10-CM | POA: Diagnosis not present

## 2016-05-27 DIAGNOSIS — F29 Unspecified psychosis not due to a substance or known physiological condition: Secondary | ICD-10-CM | POA: Diagnosis not present

## 2016-05-27 DIAGNOSIS — Z9889 Other specified postprocedural states: Secondary | ICD-10-CM | POA: Diagnosis not present

## 2016-05-27 DIAGNOSIS — Z87891 Personal history of nicotine dependence: Secondary | ICD-10-CM | POA: Insufficient documentation

## 2016-05-27 DIAGNOSIS — R4689 Other symptoms and signs involving appearance and behavior: Secondary | ICD-10-CM | POA: Diagnosis not present

## 2016-05-27 DIAGNOSIS — Z046 Encounter for general psychiatric examination, requested by authority: Secondary | ICD-10-CM

## 2016-05-27 LAB — COMPREHENSIVE METABOLIC PANEL
ALK PHOS: 77 U/L (ref 38–126)
ALT: 31 U/L (ref 14–54)
AST: 66 U/L — ABNORMAL HIGH (ref 15–41)
Albumin: 3.9 g/dL (ref 3.5–5.0)
Anion gap: 8 (ref 5–15)
BILIRUBIN TOTAL: 0.4 mg/dL (ref 0.3–1.2)
BUN: 8 mg/dL (ref 6–20)
CALCIUM: 8.9 mg/dL (ref 8.9–10.3)
CO2: 23 mmol/L (ref 22–32)
CREATININE: 0.61 mg/dL (ref 0.44–1.00)
Chloride: 108 mmol/L (ref 101–111)
GFR calc non Af Amer: >60 mL/min (ref >60)
Glucose, Bld: 118 mg/dL — ABNORMAL HIGH (ref 65–99)
Potassium: 3.6 mmol/L (ref 3.5–5.1)
SODIUM: 139 mmol/L (ref 135–145)
TOTAL PROTEIN: 7.7 g/dL (ref 6.5–8.1)

## 2016-05-27 LAB — CBC
HCT: 33.8 % — ABNORMAL LOW (ref 36.0–46.0)
Hemoglobin: 11.4 g/dL — ABNORMAL LOW (ref 12.0–15.0)
MCH: 28 pg (ref 26.0–34.0)
MCHC: 33.7 g/dL (ref 30.0–36.0)
MCV: 83 fL (ref 78.0–100.0)
PLATELETS: 271 10*3/uL (ref 150–400)
RBC: 4.07 MIL/uL (ref 3.87–5.11)
RDW: 16.7 % — AB (ref 11.5–15.5)
WBC: 11.9 10*3/uL — AB (ref 4.0–10.5)

## 2016-05-27 LAB — RAPID URINE DRUG SCREEN, HOSP PERFORMED
Amphetamines: NOT DETECTED
BENZODIAZEPINES: NOT DETECTED
Barbiturates: NOT DETECTED
Cocaine: NOT DETECTED
OPIATES: NOT DETECTED
Tetrahydrocannabinol: NOT DETECTED

## 2016-05-27 LAB — ETHANOL: Alcohol, Ethyl (B): <5 mg/dL (ref <5)

## 2016-05-27 MED ORDER — ONDANSETRON HCL 4 MG PO TABS: 4.0000 mg | ORAL_TABLET | Freq: Three times a day (TID) | ORAL | Status: DC | PRN

## 2016-05-27 MED ORDER — METOPROLOL SUCCINATE ER 50 MG PO TB24
50.0000 mg | ORAL_TABLET | Freq: Every day | ORAL | Status: DC
  Administered 2016-05-27 – 2016-05-29 (×3): 50 mg via ORAL
  Filled 2016-05-27 (×3): qty 1

## 2016-05-27 MED ORDER — ALUM & MAG HYDROXIDE-SIMETH 200-200-20 MG/5ML PO SUSP
30.0000 mL | ORAL | Status: DC | PRN
  Filled 2016-05-27: qty 30

## 2016-05-27 MED ORDER — DIVALPROEX SODIUM 500 MG PO DR TAB
500.0000 mg | DELAYED_RELEASE_TABLET | Freq: Two times a day (BID) | ORAL | Status: DC
  Administered 2016-05-27 – 2016-05-29 (×4): 500 mg via ORAL
  Filled 2016-05-27 (×4): qty 1

## 2016-05-27 MED ORDER — ACETAMINOPHEN 325 MG PO TABS
650.0000 mg | ORAL_TABLET | ORAL | Status: DC | PRN
  Administered 2016-05-28: 650 mg via ORAL
  Filled 2016-05-27: qty 2

## 2016-05-27 MED ORDER — IBUPROFEN 200 MG PO TABS: 600.0000 mg | ORAL_TABLET | Freq: Three times a day (TID) | ORAL | Status: DC | PRN

## 2016-05-27 MED ORDER — CLOZAPINE 100 MG PO TABS
300.0000 mg | ORAL_TABLET | Freq: Two times a day (BID) | ORAL | Status: DC
  Administered 2016-05-27 – 2016-05-29 (×4): 300 mg via ORAL
  Filled 2016-05-27 (×4): qty 3

## 2016-05-27 MED ORDER — ZOLPIDEM TARTRATE 10 MG PO TABS: 10.0000 mg | ORAL_TABLET | Freq: Every evening | ORAL | Status: DC | PRN

## 2016-05-27 MED ORDER — NICOTINE 21 MG/24HR TD PT24
21.0000 mg | MEDICATED_PATCH | Freq: Every day | TRANSDERMAL | Status: DC
  Administered 2016-05-28: 21 mg via TRANSDERMAL
  Filled 2016-05-27 (×2): qty 1

## 2016-05-27 MED ORDER — POLYETHYLENE GLYCOL 3350 17 G PO PACK
17.0000 g | PACK | Freq: Every day | ORAL | Status: DC
  Administered 2016-05-29: 17 g via ORAL
  Filled 2016-05-27 (×3): qty 1

## 2016-05-27 MED ORDER — ZOLPIDEM TARTRATE 5 MG PO TABS: 5.0000 mg | ORAL_TABLET | Freq: Every evening | ORAL | Status: DC | PRN

## 2016-05-27 MED ORDER — HYDROXYZINE PAMOATE 50 MG PO CAPS
50.0000 mg | ORAL_CAPSULE | Freq: Every evening | ORAL | Status: DC | PRN
  Filled 2016-05-27: qty 1

## 2016-05-27 MED ORDER — CLONAZEPAM 0.5 MG PO TABS: 0.5000 mg | ORAL_TABLET | Freq: Two times a day (BID) | ORAL | Status: DC | PRN

## 2016-05-27 MED ORDER — HYDROXYZINE HCL 25 MG PO TABS: 50.0000 mg | ORAL_TABLET | Freq: Every evening | ORAL | Status: DC | PRN

## 2016-05-27 MED ORDER — FAMOTIDINE 20 MG PO TABS
10.0000 mg | ORAL_TABLET | Freq: Every day | ORAL | Status: DC
  Administered 2016-05-27 – 2016-05-29 (×3): 10 mg via ORAL
  Filled 2016-05-27 (×3): qty 1

## 2016-05-27 MED ORDER — LORAZEPAM 1 MG PO TABS
1.0000 mg | ORAL_TABLET | Freq: Three times a day (TID) | ORAL | Status: DC | PRN
  Administered 2016-05-27: 1 mg via ORAL
  Filled 2016-05-27: qty 1

## 2016-05-27 NOTE — ED Provider Notes (Signed)
[facility]Y DEPT Provider Note   CSN: [id number] Arrival date & time: 05/27/16  1403     History   Chief Complaint Chief Complaint  Patient presents with  . IVC  . Aggressive Behavior    HPI Molly [name] is a 31 y.o. female with a pmh of paranoid/catatonic schizophrenia who was sent in under IVC for aggressive behavior. The patient will not answer questions. Her paperwork states that she has been aggressive and agitated.   HPI  Past Medical History:  Diagnosis Date  . Abnormal Pap smear   . ASC-cannot exclude HGSIL on Pap 02/15/2012   ASC-US on 02/03/2012 pap (associated Trichomonas infection). No reflex HPV testing performed on specimen.  Patient informed that she will need repeat Pap in one year.      . ATTENTION DEFICIT, W/O HYPERACTIVITY, History of 06/30/2006   Qualifier: History of  By: McDiarmid MD, Tawanna Coolerodd    . CERVICITIS, GONOCOCCAL, History of 01/09/2007   Qualifier: History of  By: McDiarmid MD, Tawanna Coolerodd    . 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 Depression   . Diabetes mellitus    diet controlled  . Eczema   . Schizophrenia (HCC)   . 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 Dennie Bibleichard Larsen (Psych) [facility]  [place], [place]. Qualifier: Hospitalized for  By: McDiarmid MD, Tawanna Coolerodd    . 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 TOBACCO USER 02/08/2009   Qualifier: Diagnosis of  By: Knox Royaltydell, Erin      Patient Active Problem List   Diagnosis Date Noted  . Disorganized schizophrenia (HCC)   . Psychoses   . Borderline intellectual functioning   . Elevated WBC count 04/07/2014  . Noncompliance with medication regimen 04/07/2014  . Schizoaffective disorder-chronic with exacerbation (HCC) 02/06/2014  . Aggressive behavior 08/16/2013  . Papular rash, generalized 09/01/2012  . Amenorrhea 04/25/2012  . ASC-cannot  exclude HGSIL on Pap 02/15/2012  . Screening for malignant neoplasm of the cervix 02/01/2012  . 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 Obesity, unspecified 02/10/2009  . TOBACCO USER 02/08/2009  . DIABETES MELLITUS 04/02/2008  . CERVICITIS, GONOCOCCAL, History of 01/09/2007  . TRICHOMONAL VAGINITIS 01/09/2007  . ECZEMA, ATOPIC DERMATITIS 06/30/2006    Past Surgical History:  Procedure Laterality Date  . INCISION AND DRAINAGE     pilanodal cyst    OB History    Gravida Para Term Preterm AB Living   3 1 1     1    SAB TAB Ectopic Multiple Live Births                   Home Medications    Prior to Admission medications   Medication Sig Start Date End Date Taking? Authorizing Provider  acetaminophen (TYLENOL) 500 MG tablet Take 500 mg by mouth every 6 (six) hours as needed for headache (headache).    Historical Provider, MD  alum & mag hydroxide-simeth (MAALOX/MYLANTA) 200-200-20 MG/5ML suspension Take 30 mLs by mouth every 6 (six) hours as needed for indigestion or heartburn.    Historical Provider, MD  benztropine (COGENTIN) 0.5 MG tablet Take 1 tablet (0.5 mg total) by mouth 2 (two) times daily. Patient not taking: Reported on 05/04/2016 04/17/14   Thermon LeylandLaura A Davis, NP  benztropine mesylate (COGENTIN) 1 MG/ML injection Inject 0.5 mLs (0.5 mg  total) into the muscle every 30 (thirty) days. First dose received on 04/16/15 with next dose due in thirty days. Patient not taking: Reported on 05/04/2016 05/16/14   Thermon Leyland, NP  cholecalciferol (VITAMIN D) 1000 units tablet Take 2,000 Units by mouth daily.    Historical Provider, MD  clonazePAM (KLONOPIN) 0.5 MG tablet Take 0.5 mg by mouth 2 (two) times daily as needed for anxiety.    Historical Provider, MD  cloZAPine (CLOZARIL) 100 MG tablet Take 300 mg by mouth 2 (two) times daily.    Historical Provider, MD  divalproex (DEPAKOTE) 500 MG DR tablet Take 500 mg by mouth 2 (two) times daily.    Historical Provider, MD  doxycycline  (VIBRAMYCIN) 100 MG capsule Take 1 capsule (100 mg total) by mouth 2 (two) times daily. One po bid x 7 days Patient not taking: Reported on 05/04/2016 01/09/15   Loren Racer, MD  ferrous sulfate 325 (65 FE) MG tablet Take 325 mg by mouth daily with breakfast.    Historical Provider, MD  haloperidol decanoate (HALDOL DECANOATE) 100 MG/ML injection Inject 1 mL (100 mg total) into the muscle every 30 (thirty) days. Patient not taking: Reported on 05/04/2016 05/16/14   Thermon Leyland, NP  hydrOXYzine (VISTARIL) 50 MG capsule Take 50 mg by mouth at bedtime as needed.    Historical Provider, MD  medroxyPROGESTERone (DEPO-PROVERA) 150 MG/ML injection Inject 150 mg into the muscle every 3 (three) months.    Historical Provider, MD  metoprolol succinate (TOPROL-XL) 50 MG 24 hr tablet Take 50 mg by mouth daily. Take with or immediately following a meal.    Historical Provider, MD  Multiple Vitamins-Iron (MULTIVITAMINS WITH IRON) TABS tablet Take 1 tablet by mouth daily.    Historical Provider, MD  nitrofurantoin, macrocrystal-monohydrate, (MACROBID) 100 MG capsule Take 1 capsule (100 mg total) by mouth 2 (two) times daily. Patient not taking: Reported on 05/04/2016 08/06/14   Charlestine Night, PA-C  polyethylene glycol [facility] / GLYCOLAX) packet Take 17 g by mouth daily.    Historical Provider, MD  Prenatal Vit-Fe Fumarate-FA (MULTIVITAMIN-PRENATAL) 27-0.8 MG TABS tablet Take 1 tablet by mouth daily at 12 noon.    Historical Provider, MD  ranitidine (ZANTAC) 150 MG tablet Take 150 mg by mouth 2 (two) times daily.    Historical Provider, MD  RisperiDONE (RISPERDAL PO) Take 1 tablet by mouth daily.    Historical Provider, MD  senna (SENOKOT) 8.6 MG TABS tablet Take 1 tablet by mouth.    Historical Provider, MD  traZODone (DESYREL) 100 MG tablet Take 1 tablet (100 mg total) by mouth at bedtime. Patient not taking: Reported on 05/04/2016 04/17/14   Thermon Leyland, NP  vitamin C (ASCORBIC ACID) 500 MG tablet Take 500 mg  by mouth daily.    Historical Provider, MD  zolpidem (AMBIEN) 10 MG tablet Take 10 mg by mouth at bedtime as needed for sleep.    Historical Provider, MD    Family History Family History  Problem Relation Age of Onset  . Adopted: Yes  . Bipolar disorder Sister   . Alcohol abuse Brother   . Cancer Father   . Diabetes Mother     Social History Social History  Substance Use Topics  . Smoking status: Former Smoker    Packs/day: 0.50    Years: 10.00    Types: Cigarettes  . Smokeless tobacco: Never Used  . Alcohol use No     Comment: occ     Allergies  Abilify [aripiprazole]   Review of Systems Review of Systems  Unable to perform ROS: Psychiatric disorder     Physical Exam Updated Vital Signs There were no vitals taken for this visit.  Physical Exam  Constitutional: She is oriented to person, place, and time. She appears well-developed and well-nourished. No distress.  HENT:  Head: Normocephalic and atraumatic.  Eyes: Conjunctivae and EOM are normal. Pupils are equal, round, and reactive to light. No scleral icterus.  Neck: Normal range of motion.  Cardiovascular: Normal rate, regular rhythm and normal heart sounds.  Exam reveals no gallop and no friction rub.   No murmur heard. Pulmonary/Chest: Effort normal and breath sounds normal. No respiratory distress.  Abdominal: Soft. Bowel sounds are normal. She exhibits no distension and no mass. There is no tenderness. There is no guarding.  Neurological: She is alert and oriented to person, place, and time.  Skin: Skin is warm and dry. She is not diaphoretic.  Nursing note and vitals reviewed.    ED Treatments / Results  Labs (all labs ordered are listed, but only abnormal results are displayed) Labs Reviewed  COMPREHENSIVE METABOLIC PANEL - Abnormal; Notable for the following:       Result Value   Glucose, Bld 118 (*)    AST 66 (*)    All other components within normal limits  CBC - Abnormal; Notable for the  following:    WBC 11.9 (*)    Hemoglobin 11.4 (*)    HCT 33.8 (*)    RDW 16.7 (*)    All other components within normal limits  RAPID URINE DRUG SCREEN, HOSP PERFORMED  ETHANOL    EKG  EKG Interpretation None       Radiology No results found.  Procedures Procedures (including critical care time)  Medications Ordered in ED Medications - No data to display   Initial Impression / Assessment and Plan / ED Course  I have reviewed the triage vital signs and the nursing notes.  Pertinent labs & imaging results that were available during my care of the patient were reviewed by me and considered in my medical decision making (see chart for details).     Patient under IVC. Medically clear.  Final Clinical Impressions(s) / ED Diagnoses   Final diagnoses:  Involuntary commitment    New Prescriptions New Prescriptions   No medications on file     Arthor Captain, PA-C 05/27/16 2052    Arby Barrette, MD 05/31/16 [phone]

## 2016-05-27 NOTE — ED Notes (Signed)
Unsuccessful lab collection x1.

## 2016-05-27 NOTE — ED Notes (Signed)
Medications have not been verified. Pharmacy notified. Will administer once verified.

## 2016-05-27 NOTE — BH Assessment (Signed)
Under Review: Lanny HurstBrynn mar, [place], [place], [place], [address], [place], [place], [place]

## 2016-05-27 NOTE — ED Notes (Signed)
TTS at bedside. 

## 2016-05-27 NOTE — BH Assessment (Signed)
Assessment Note  Molly [name] is an 31 y.o. female brought to [facility] by [employer]. Patient under IVC. The IVC paper states, "A danger to self and others, to wit: diagnosed schizophrenic and off meds for several days; exhibiting aggressive behavior towards social and mental health workers; engaging in risky behavior such as trading sex for cigarettes."  Pt presents with flat affect, irritable mood, poor eye-contact, and minimal "baby like" speech. Pt refuses to answer some questions or only gives brief answers to questions asked by counselor. Pt appears disheveled and is drowsy throughout interview. Pt's speech is often incoherent and thought process is delusional and sometimes irrelevant. Pt is only oriented to person and place. Pt repeatedly asking for a "blue inhaler". Pt was just discharged from [facility] 05/07/2016. She was discharged back to her group home. The administrator Marliss Czar [phone] assumed responsibility for patient and picked her up from [facility]. Writer is unclear if patient continues to reside at this group home. Patient sts, "I don't' like at that place anymore .Marland KitchenI now live at the [facility] and [employer]   Pt has a hx of schizophrenia and ADHD dx. While in the ER today, pt was visibly anxious. Per pt chart, she receives ACTT services from [facility] in [place]. She has had multiple psychiatric hospitalizations with her most recent [facility] admissions being  01/2014 and 04/2014; pt has reportedly had several hospitalizations (at other facilities) in 2016 as well. Pt denies A/VH presentl. Pt denies SI/HI but does have a hx of suicidal ideations. Patient denies alcohol and drug use.   Diagnosis: Schizophrenia  Past Medical History:  Past Medical History:  Diagnosis Date  . Abnormal Pap smear   . ASC-cannot exclude HGSIL on Pap 02/15/2012   ASC-US on 02/03/2012 pap (associated Trichomonas infection). No reflex HPV testing performed on specimen.   Patient informed that she will need repeat Pap in one year.      . ATTENTION DEFICIT, W/O HYPERACTIVITY, History of 06/30/2006   Qualifier: History of  By: McDiarmid MD, Tawanna Cooler    . CERVICITIS, GONOCOCCAL, History of 01/09/2007   Qualifier: History of  By: McDiarmid MD, Tawanna Cooler    . 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 Depression   . Diabetes mellitus    diet controlled  . Eczema   . Schizophrenia (HCC)   . 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 Dennie Bible (Psych) At [facility] in  [place], [place]. Qualifier: Hospitalized for  By: McDiarmid MD, Tawanna Cooler    . 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 TOBACCO USER 02/08/2009   Qualifier: Diagnosis of  By: Knox Royalty      Past Surgical History:  Procedure Laterality Date  . INCISION AND DRAINAGE     pilanodal cyst    Family History:  Family History  Problem Relation Age of Onset  . Adopted: Yes  . Bipolar disorder Sister   . Alcohol abuse Brother   . Cancer Father   . Diabetes Mother     Social History:  reports that she has quit smoking. Her smoking use included Cigarettes. She has a 5.00 pack-year smoking history. She has never used smokeless tobacco. She reports that she does not drink alcohol or use drugs.  Additional Social History:     CIWA: CIWA-Ar BP: 124/97 Pulse Rate: Marland Kitchen)  123 COWS:    Allergies:  Allergies  Allergen Reactions  . Abilify [Aripiprazole] Other (See Comments)    Thinks it's nasty- does not want it.  Injection is ok.      Home Medications:  (Not in a hospital admission)  OB/GYN Status:  No LMP recorded. Patient is not currently having periods (Reason: Irregular Periods).  General Assessment Data [facility] of Assessment: [facility] TTS Assessment: In system Is this a Tele or Face-to-Face Assessment?: Face-to-Face Is this an Initial Assessment  or a Re-assessment for this encounter?: Initial Assessment Marital status: Single Maiden name:  (n/a) Is patient pregnant?: No Pregnancy Status: No Living Arrangements: Other (Comment) (living in a [facility] as of 05/07/16; unclear of current living arran) Can pt return to current living arrangement?:  (unk) Admission Status: Voluntary Is patient capable of signing voluntary admission?: No Referral Source: Self/Family/Friend Insurance type:  (MCR/MCD)     Crisis Care Plan Living Arrangements: Other (Comment) (living in a [facility] as of 05/07/16; unclear of current living arran) Legal Guardian:  ([name] [phone]) Name of Psychiatrist:  (no psychiatrist ) Name of Therapist:  (no therapist )  Education Status Is patient currently in school?: No Current Grade:  (n/a) Highest grade of school patient has completed: unk Name of school:  (n/a) Contact person:  (n/a)  Risk to self with the past 6 months Suicidal Ideation: No Has patient been a risk to self within the past 6 months prior to admission? : No Suicidal Intent: No Has patient had any suicidal intent within the past 6 months prior to admission? : No Is patient at risk for suicide?: No Suicidal Plan?: No Has patient had any suicidal plan within the past 6 months prior to admission? : No Access to Means: No What has been your use of drugs/alcohol within the last 12 months?:  (n/a) Previous Attempts/Gestures: No How many times?:  (0) Other Self Harm Risks:  (denies ) Triggers for Past Attempts: Other (Comment) (no prior attempts or gestures) Intentional Self Injurious Behavior: None Family Suicide History: No Recent stressful life event(s): Other (Comment) (denies stressors) Persecutory voices/beliefs?: No Depression: No Depression Symptoms:  (denies depressive symptoms) Substance abuse history and/or treatment for substance abuse?: No Suicide prevention information given to non-admitted patients: Not applicable  Risk to  Others within the past 6 months Homicidal Ideation: No Does patient have any lifetime risk of violence toward others beyond the six months prior to admission? : No Thoughts of Harm to Others: No Current Homicidal Intent: No Current Homicidal Plan: No Access to Homicidal Means: No Identified Victim:  (n/a) History of harm to others?: No Assessment of Violence: None Noted Violent Behavior Description:  ( calm and cooperative ) Does patient have access to weapons?: No Criminal Charges Pending?: No Does patient have a court date: No Is patient on probation?: No  Psychosis Hallucinations: None noted (patient denies ) Delusions: Unspecified (patient denies )  Mental Status Report Appearance/Hygiene: In scrubs Eye Contact: Fair Motor Activity: Agitation, Restlessness Speech: Slow, Soft Level of Consciousness: Alert Mood: Anxious, Suspicious, Preoccupied Affect: Inconsistent with thought content, Preoccupied, Apprehensive Anxiety Level: None Thought Processes: Relevant, Coherent Judgement: Impaired Orientation: Person, Place, Time, Situation Obsessive Compulsive Thoughts/Behaviors: None  Cognitive Functioning Concentration: Decreased Memory: Recent Intact, Remote Intact IQ: Average Insight: Poor Impulse Control: Poor Appetite: Poor Weight Loss:  (none reported) Weight Gain:  (none reported) Sleep: No Change (none reported) Total Hours of Sleep:  (patient did not provide an exact amt of hrs )  Vegetative Symptoms: None  ADLScreening [facility] Assessment Services) Patient's cognitive ability adequate to safely complete daily activities?: Yes Patient able to express need for assistance with ADLs?: Yes Independently performs ADLs?: Yes (appropriate for developmental age)  Prior Inpatient Therapy Prior Inpatient Therapy: Yes Prior Therapy Dates:  (02/06/14 & 04/09/14-[facility]; 05/10/16 & 05/04/16-[facility]) Prior [facility] Reason for Treatment:  (psychosis  )  Prior Outpatient Therapy Prior Outpatient Therapy:  (unk) Prior Therapy Dates: unk Prior Therapy Facilty/Provider(s):  (unk) Reason for Treatment:  (unk) Does patient have an ACCT team?: Unknown Does patient have Intensive In-House Services?  : Unknown Does patient have [facility] services? : Unknown Does patient have P4CC services?: Unknown  ADL Screening (condition at time of admission) Patient's cognitive ability adequate to safely complete daily activities?: Yes Patient able to express need for assistance with ADLs?: Yes Independently performs ADLs?: Yes (appropriate for developmental age)             Merchant navy officerAdvance Directives (For Healthcare) Does Patient Have a Medical Advance Directive?: No Would patient like information on creating a medical advance directive?: No - Patient declined    Additional Information 1:1 In Past 12 Months?: No CIRT Risk: No Elopement Risk: No Does patient have medical clearance?: No     Disposition:  Disposition Initial Assessment Completed for this Encounter: Yes Disposition of Patient: Inpatient treatment program (Per Nanine MeansJamison Lord, DNP. patient meets criteria for INPT [place])  On Site Evaluation by:   Reviewed with Physician:    Melynda Rippleoyka Ruhama Lehew 05/27/2016 6:31 PM

## 2016-05-27 NOTE — ED Triage Notes (Signed)
[facility] IVC paper states, "A danger to self and others, to wit: diagnosed schizophrenic and off meds for several days; exhibiting aggressive behavior towards social and mental health workers; engaging in risky behavior such as trading sex for cigarettes."

## 2016-05-27 NOTE — ED Notes (Signed)
Pt smells of urine. Pt refuses to shower or let staff clean her. Pants and bed changed.

## 2016-05-27 NOTE — ED Notes (Signed)
ED Provider at bedside. 

## 2016-05-28 DIAGNOSIS — Z79899 Other long term (current) drug therapy: Secondary | ICD-10-CM | POA: Diagnosis not present

## 2016-05-28 DIAGNOSIS — F29 Unspecified psychosis not due to a substance or known physiological condition: Secondary | ICD-10-CM | POA: Diagnosis not present

## 2016-05-28 DIAGNOSIS — Z888 Allergy status to other drugs, medicaments and biological substances status: Secondary | ICD-10-CM

## 2016-05-28 DIAGNOSIS — Z9889 Other specified postprocedural states: Secondary | ICD-10-CM | POA: Diagnosis not present

## 2016-05-28 DIAGNOSIS — F918 Other conduct disorders: Secondary | ICD-10-CM | POA: Diagnosis not present

## 2016-05-28 DIAGNOSIS — Z87891 Personal history of nicotine dependence: Secondary | ICD-10-CM

## 2016-05-28 DIAGNOSIS — F258 Other schizoaffective disorders: Secondary | ICD-10-CM

## 2016-05-28 DIAGNOSIS — R4183 Borderline intellectual functioning: Secondary | ICD-10-CM

## 2016-05-28 LAB — URINALYSIS, ROUTINE W REFLEX MICROSCOPIC
BILIRUBIN URINE: NEGATIVE
GLUCOSE, UA: NEGATIVE mg/dL
HGB URINE DIPSTICK: NEGATIVE
Ketones, ur: 5 mg/dL — AB
Leukocytes, UA: NEGATIVE
Nitrite: NEGATIVE
PH: 7 (ref 5.0–8.0)
Protein, ur: NEGATIVE mg/dL
SPECIFIC GRAVITY, URINE: 1.017 (ref 1.005–1.030)

## 2016-05-28 LAB — POC URINE PREG, ED: PREG TEST UR: NEGATIVE

## 2016-05-28 MED ORDER — TRAZODONE HCL 100 MG PO TABS: 100.0000 mg | ORAL_TABLET | Freq: Every evening | ORAL | Status: DC | PRN

## 2016-05-28 MED ORDER — BENZTROPINE MESYLATE 0.5 MG PO TABS
0.5000 mg | ORAL_TABLET | Freq: Two times a day (BID) | ORAL | Status: DC
  Administered 2016-05-28 – 2016-05-29 (×3): 0.5 mg via ORAL
  Filled 2016-05-28 (×3): qty 1

## 2016-05-28 MED ORDER — TRAZODONE HCL 100 MG PO TABS: 100.0000 mg | ORAL_TABLET | Freq: Every day | ORAL | Status: DC

## 2016-05-28 NOTE — ED Notes (Signed)
Attempted to give medication to patient.  Patient asleep and would not wake up to take medication.  Will try again later.

## 2016-05-28 NOTE — ED Notes (Signed)
Patient refused to give urine sample, refused to have her blood drawn, and refused the EKG.

## 2016-05-28 NOTE — ED Notes (Signed)
Dr. Plunkett at bedside.  

## 2016-05-28 NOTE — ED Notes (Signed)
Dr. Anitra LauthPlunkett notified of patient's foot pain also.

## 2016-05-28 NOTE — ED Notes (Signed)
[name]: (pt legal gaurdian) (work): [phone] (cell): [phone]  Would like update when pt gets placement.

## 2016-05-28 NOTE — ED Notes (Signed)
Patient c/o foot pain.  Requested tylenol for foot pain.  Feet appeared swollen.  Spoke with guardian on the phone earlier who said patient has been out on the streets engaging in sexual behavior such as trading cigarettes for sex.  Patient is beginning to get agitated wanting to be discharged.

## 2016-05-28 NOTE — BH Assessment (Addendum)
[facility] Assessment Progress Note  Per Thedore MinsMojeed Akintayo, MD, this pt requires psychiatric hospitalization at this time.  The following facilities have been contacted to seek placement for this pt, with results as noted:  Beds available, information sent, decision pending:  [facility] [place]   At capacity:  [facility]   Doylene Canninghomas Sheretha [place]add, [place] Triage Specialist [phone]

## 2016-05-28 NOTE — Consult Note (Signed)
[place] [facility] Consult   Reason for Consult:  Non-compliant with medication, mood lability Referring Physician:  EDP Patient Identification: Molly [name] MRN:  [id number] Principal Diagnosis: Schizoaffective disorder-chronic with exacerbation [facility]) Diagnosis:   Patient Active Problem List   Diagnosis Date Noted  . 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 Schizoaffective disorder-chronic with exacerbation ([place]) [F25.8] 02/06/2014    Priority: High  . 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 Psychoses [F29]   . Elevated WBC count [D72.829] 04/07/2014  . Noncompliance with medication regimen [Z91.14] 04/07/2014  . Aggressive behavior [R45.89] 08/16/2013  . Papular rash, generalized [R21] 09/01/2012  . Amenorrhea [N91.2] 04/25/2012  . ASC-cannot exclude HGSIL on Pap [R89.6] 02/15/2012  . Screening for malignant neoplasm of the cervix [Z12.4] 02/01/2012  . 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 Obesity, unspecified [E66.9] 02/10/2009  . TOBACCO USER [F17.200] 02/08/2009  . DIABETES MELLITUS [E11.9] 04/02/2008  . CERVICITIS, GONOCOCCAL, History of [A54.03] 01/09/2007  . TRICHOMONAL VAGINITIS [A59.01] 01/09/2007  . ECZEMA, ATOPIC DERMATITIS [L20.89] 06/30/2006    Total Time spent with patient: 45 minutes  Subjective:   Molly [name] is a 31 y.o. female patient admitted with aggressive behavior and engaging in risky behavior.  HPI: Patient with history of Schizoaffective disorder who was brought to [facility] under IVC paper taking out by her mental health case manager. The IVC paper states that patient is  "a danger to self and others, to wit: diagnosed schizophrenic and off meds for several days; exhibiting aggressive behavior towards social and mental health workers; engaging in risky behavior such as trading sex for cigarettes." Patient is a poor historian who presents with mood lability, poor hygiene/grooming and disorganized  behavior. She is talking to herself as if responding to internal stimuli.  Past Psychiatric History: as above  Risk to Self: Suicidal Ideation: No Suicidal Intent: No Is patient at risk for suicide?: No Suicidal Plan?: No Access to Means: No What has been your use of drugs/alcohol within the last 12 months?:  (n/a) How many times?:  (0) Other Self Harm Risks:  (denies ) Triggers for Past Attempts: Other (Comment) (no prior attempts or gestures) Intentional Self Injurious Behavior: None Risk to Others: Homicidal Ideation: No Thoughts of Harm to Others: No Current Homicidal Intent: No Current Homicidal Plan: No Access to Homicidal Means: No Identified Victim:  (n/a) History of harm to others?: No Assessment of Violence: None Noted Violent Behavior Description:  ( calm and cooperative ) Does patient have access to weapons?: No Criminal Charges Pending?: No Does patient have a court date: No Prior Inpatient Therapy: Prior Inpatient Therapy: Yes Prior Therapy Dates:  (02/06/14 & 04/09/14-[facility]; 05/10/16 & 05/04/16-[facility]) Prior Therapy Facilty/Provider(s): [facility] and [place] Reason for Treatment:  (psychosis ) Prior Outpatient Therapy: Prior Outpatient Therapy:  (unk) Prior Therapy Dates: unk Prior Therapy Facilty/Provider(s):  (unk) Reason for Treatment:  (unk) Does patient have an ACCT team?: Unknown Does patient have Intensive In-House Services?  : Unknown Does patient have [facility] services? : Unknown Does patient have P4CC services?: Unknown  Past Medical History:  Past Medical History:  Diagnosis Date  . Abnormal Pap smear   . ASC-cannot exclude HGSIL on Pap 02/15/2012   ASC-US on 02/03/2012 pap (associated Trichomonas infection). No reflex HPV testing performed on specimen.  Patient informed that she will need repeat Pap in one year.      . ATTENTION DEFICIT, W/O HYPERACTIVITY, History of 06/30/2006   Qualifier: History of  By: McDiarmid MD, Sherren Mocha    . CERVICITIS, GONOCOCCAL, History of  01/09/2007   Qualifier: History of  By: McDiarmid MD, Sherren Mocha    . 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 Depression   . Diabetes mellitus    diet controlled  . Eczema   . Schizophrenia ([place])   . 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 Henrene Dodge (Psych) At [facility] in  [place], [place]. Qualifier: Hospitalized for  By: McDiarmid MD, Sherren Mocha    . 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 TOBACCO USER 02/08/2009   Qualifier: Diagnosis of  By: Samara Snide      Past Surgical History:  Procedure Laterality Date  . INCISION AND DRAINAGE     pilanodal cyst   Family History:  Family History  Problem Relation Age of Onset  . Adopted: Yes  . Bipolar disorder Sister   . Alcohol abuse Brother   . Cancer Father   . Diabetes Mother    Family Psychiatric  History:  Social History:  History  Alcohol Use No    Comment: occ     History  Drug Use No    Social History   Social History  . Marital status: Single    Spouse name: N/A  . Number of children: N/A  . Years of education: N/A   Social History Main Topics  . Smoking status: Former Smoker    Packs/day: 0.50    Years: 10.00    Types: Cigarettes  . Smokeless tobacco: Never Used  . Alcohol use No     Comment: occ  . Drug use: No  . Sexual activity: Yes    Birth control/ protection: None   Other Topics Concern  . None   Social History Narrative   Adopted   Living in [place] home with [name]   Transportation: Clinical biochemist Social History:    Allergies:   Allergies  Allergen Reactions  . Abilify [Aripiprazole] Other (See Comments)    Thinks it's nasty- does not want it.  Injection is ok.      Labs:  Results for orders placed or performed during the hospital encounter of 05/27/16 (from the past 48 hour(s))  Rapid urine drug screen (hospital  performed)     Status: None   Collection Time: 05/27/16  2:30 PM  Result Value Ref Range   Opiates NONE DETECTED NONE DETECTED   Cocaine NONE DETECTED NONE DETECTED   Benzodiazepines NONE DETECTED NONE DETECTED   Amphetamines NONE DETECTED NONE DETECTED   Tetrahydrocannabinol NONE DETECTED NONE DETECTED   Barbiturates NONE DETECTED NONE DETECTED    Comment:        DRUG SCREEN FOR MEDICAL PURPOSES ONLY.  IF CONFIRMATION IS NEEDED FOR ANY PURPOSE, NOTIFY LAB WITHIN 5 DAYS.        LOWEST DETECTABLE LIMITS FOR URINE DRUG SCREEN Drug Class       Cutoff (ng/mL) Amphetamine      1000 Barbiturate      200 Benzodiazepine   096 Tricyclics       045 Opiates          300 Cocaine          300 THC              50   Comprehensive metabolic panel  Status: Abnormal   Collection Time: 05/27/16  2:49 PM  Result Value Ref Range   Sodium 139 135 - 145 mmol/L   Potassium 3.6 3.5 - 5.1 mmol/L   Chloride 108 101 - 111 mmol/L   CO2 23 22 - 32 mmol/L   Glucose, Bld 118 (H) 65 - 99 mg/dL   BUN 8 6 - 20 mg/dL   Creatinine, Ser 0.61 0.44 - 1.00 mg/dL   Calcium 8.9 8.9 - 10.3 mg/dL   Total Protein 7.7 6.5 - 8.1 g/dL   Albumin 3.9 3.5 - 5.0 g/dL   AST 66 (H) 15 - 41 U/L   ALT 31 14 - 54 U/L   Alkaline Phosphatase 77 38 - 126 U/L   Total Bilirubin 0.4 0.3 - 1.2 mg/dL   GFR calc non Af Amer >60 >60 mL/min   GFR calc Af Amer >60 >60 mL/min    Comment: (NOTE) The eGFR has been calculated using the CKD EPI equation. This calculation has not been validated in all clinical situations. eGFR's persistently <60 mL/min signify possible Chronic Kidney Disease.    Anion gap 8 5 - 15  Ethanol     Status: None   Collection Time: 05/27/16  2:49 PM  Result Value Ref Range   Alcohol, Ethyl (B) <5 <5 mg/dL    Comment:        LOWEST DETECTABLE LIMIT FOR SERUM ALCOHOL IS 5 mg/dL FOR MEDICAL PURPOSES ONLY   cbc     Status: Abnormal   Collection Time: 05/27/16  2:49 PM  Result Value Ref Range   WBC 11.9  (H) 4.0 - 10.5 K/uL   RBC 4.07 3.87 - 5.11 MIL/uL   Hemoglobin 11.4 (L) 12.0 - 15.0 g/dL   HCT 33.8 (L) 36.0 - 46.0 %   MCV 83.0 78.0 - 100.0 fL   MCH 28.0 26.0 - 34.0 pg   MCHC 33.7 30.0 - 36.0 g/dL   RDW 16.7 (H) 11.5 - 15.5 %   Platelets 271 150 - 400 K/uL    Current Facility-Administered Medications  Medication Dose Route Frequency Provider Last Rate Last Dose  . acetaminophen (TYLENOL) tablet 650 mg  650 mg Oral Q4H PRN Margarita Mail, PA-C      . alum & mag hydroxide-simeth (MAALOX/MYLANTA) 200-200-20 MG/5ML suspension 30 mL  30 mL Oral PRN Margarita Mail, PA-C      . benztropine (COGENTIN) tablet 0.5 mg  0.5 mg Oral BID Rosa Gambale, MD      . cloZAPine (CLOZARIL) tablet 300 mg  300 mg Oral BID Margarita Mail, PA-C   300 mg at 05/28/16 1011  . divalproex (DEPAKOTE) DR tablet 500 mg  500 mg Oral BID Margarita Mail, PA-C   500 mg at 05/28/16 1012  . famotidine (PEPCID) tablet 10 mg  10 mg Oral Daily Margarita Mail, PA-C   10 mg at 05/28/16 1011  . hydrOXYzine (ATARAX/VISTARIL) tablet 50 mg  50 mg Oral QHS PRN Charlesetta Shanks, MD      . ibuprofen (ADVIL,MOTRIN) tablet 600 mg  600 mg Oral Q8H PRN Margarita Mail, PA-C      . metoprolol succinate (TOPROL-XL) 24 hr tablet 50 mg  50 mg Oral Daily Margarita Mail, PA-C   50 mg at 05/28/16 1015  . nicotine (NICODERM CQ - dosed in mg/24 hours) patch 21 mg  21 mg Transdermal Daily Margarita Mail, PA-C   21 mg at 05/28/16 1011  . ondansetron (ZOFRAN) tablet 4 mg  4 mg Oral Q8H PRN  Margarita Mail, PA-C      . polyethylene glycol (MIRALAX / GLYCOLAX) packet 17 g  17 g Oral Daily Abigail Harris, PA-C      . traZODone (DESYREL) tablet 100 mg  100 mg Oral QHS PRN Corena Pilgrim, MD       Current Outpatient Prescriptions  Medication Sig Dispense Refill  . alum & mag hydroxide-simeth (MAALOX/MYLANTA) 200-200-20 MG/5ML suspension Take 30 mLs by mouth every 6 (six) hours as needed for indigestion or heartburn.    . cholecalciferol (VITAMIN D) 1000  units tablet Take 2,000 Units by mouth daily.    . clonazePAM (KLONOPIN) 1 MG tablet Take 1 mg by mouth as directed.    . cloZAPine (CLOZARIL) 100 MG tablet Take 300 mg by mouth 2 (two) times daily.    . divalproex (DEPAKOTE) 500 MG DR tablet Take 500 mg by mouth 2 (two) times daily.    . ferrous sulfate 325 (65 FE) MG tablet Take 325 mg by mouth daily with breakfast.    . haloperidol (HALDOL) 1 MG tablet Take 1 mg by mouth as directed.    . linaclotide (LINZESS) 72 MCG capsule Take 72 mcg by mouth daily before breakfast.    . metoprolol succinate (TOPROL-XL) 50 MG 24 hr tablet Take 50 mg by mouth daily. Take with or immediately following a meal.    . Multiple Vitamins-Iron (MULTIVITAMINS WITH IRON) TABS tablet Take 1 tablet by mouth daily.    . polyethylene glycol (MIRALAX / GLYCOLAX) packet Take 17 g by mouth daily.    . ranitidine (ZANTAC) 150 MG tablet Take 150 mg by mouth 2 (two) times daily.    Marland Kitchen senna (SENOKOT) 8.6 MG TABS tablet Take 1 tablet by mouth.    . vitamin C (ASCORBIC ACID) 500 MG tablet Take 500 mg by mouth daily.    Marland Kitchen zolpidem (AMBIEN) 10 MG tablet Take 10 mg by mouth at bedtime as needed for sleep.    Marland Kitchen acetaminophen (TYLENOL) 500 MG tablet Take 500 mg by mouth every 6 (six) hours as needed for headache (headache).    . benztropine (COGENTIN) 0.5 MG tablet Take 1 tablet (0.5 mg total) by mouth 2 (two) times daily. (Patient not taking: Reported on 05/27/2016) 60 tablet 0  . benztropine mesylate (COGENTIN) 1 MG/ML injection Inject 0.5 mLs (0.5 mg total) into the muscle every 30 (thirty) days. First dose received on 04/16/15 with next dose due in thirty days. (Patient not taking: Reported on 05/04/2016) 2 mL 0  . doxycycline (VIBRAMYCIN) 100 MG capsule Take 1 capsule (100 mg total) by mouth 2 (two) times daily. One po bid x 7 days (Patient not taking: Reported on 05/27/2016) 14 capsule 0  . haloperidol decanoate (HALDOL DECANOATE) 100 MG/ML injection Inject 1 mL (100 mg total) into the  muscle every 30 (thirty) days. (Patient not taking: Reported on 05/04/2016) 1 mL 0  . hydrOXYzine (VISTARIL) 50 MG capsule Take 50 mg by mouth at bedtime as needed.    . medroxyPROGESTERone (DEPO-PROVERA) 150 MG/ML injection Inject 150 mg into the muscle every 3 (three) months.    . traZODone (DESYREL) 100 MG tablet Take 1 tablet (100 mg total) by mouth at bedtime. (Patient not taking: Reported on 05/04/2016) 30 tablet 0    Musculoskeletal: Strength & Muscle Tone: within normal limits Gait & Station: normal Patient leans: N/A  Psychiatric Specialty Exam: Physical Exam  Psychiatric: Her affect is angry and labile. Her speech is delayed and tangential. She is agitated, aggressive  and slowed. Thought content is paranoid. Cognition and memory are normal. She expresses impulsivity.    Review of Systems  Constitutional: Negative.   HENT: Negative.   Eyes: Negative.   Respiratory: Negative.   Cardiovascular: Negative.   Gastrointestinal: Negative.   Genitourinary: Negative.   Musculoskeletal: Negative.   Skin: Negative.   Neurological: Negative.   Endo/Heme/Allergies: Negative.   Psychiatric/Behavioral: The patient has insomnia.     Blood pressure 116/76, pulse (!) 131, temperature 98.1 F (36.7 C), temperature source Oral, resp. rate 18, SpO2 99 %, unknown if currently breastfeeding.There is no height or weight on file to calculate BMI.  General Appearance: Disheveled  Eye Contact:  Minimal  Speech:  Slow  Volume:  Increased  Mood:  Dysphoric and Irritable  Affect:  Labile  Thought Process:  Disorganized  Orientation:  Other:  only place and person  Thought Content:  Illogical and Hallucinations: Auditory  Suicidal Thoughts:  No  Homicidal Thoughts:  No  Memory:  Immediate;   Fair Recent;   Fair Remote;   Fair  Judgement:  Poor  Insight:  Lacking  Psychomotor Activity:  Increased and Restlessness  Concentration:  Concentration: Fair and Attention Span: Fair  Recall:  [employer] of Knowledge:  Fair  Language:  Good  Akathisia:  No  Handed:  Right  AIMS (if indicated):     Assets:  Communication Skills  ADL's:  Intact  Cognition:  WNL  Sleep:   poor     Treatment Plan Summary: Daily contact with patient to assess and evaluate symptoms and progress in treatment and Medication management  Continue Clozaril 300 mg bid for Schizoaffective disorder Continue Depakote 524m bid for mood stabilization Continue Benzodiazepine 0.5 mg bid for EPS/Salivation  Disposition: Recommend psychiatric Inpatient admission when medically cleared.  ACorena Pilgrim MD 05/28/2016 10:55 AM

## 2016-05-28 NOTE — ED Provider Notes (Signed)
Patient with persistent tachycardia even after being administered her Toprol 50 mg. Patient is tachycardic on exam. However she is refusing to let me examine her. She is sleeping and in no acute distress. Potential for dehydration will encouraged to take fluids.  Ordered EKG however patient currently is refusing. She does not have a history of alcohol abuse but does have a history of prior benzo use but was negative for benzodiazepines.   Gwyneth SproutWhitney Keimon Basaldua, MD 05/28/16 1600

## 2016-05-29 DIAGNOSIS — E119 Type 2 diabetes mellitus without complications: Secondary | ICD-10-CM

## 2016-05-29 DIAGNOSIS — Z87891 Personal history of nicotine dependence: Secondary | ICD-10-CM | POA: Diagnosis not present

## 2016-05-29 DIAGNOSIS — Z79899 Other long term (current) drug therapy: Secondary | ICD-10-CM | POA: Diagnosis not present

## 2016-05-29 DIAGNOSIS — F258 Other schizoaffective disorders: Secondary | ICD-10-CM | POA: Diagnosis not present

## 2016-05-29 DIAGNOSIS — F29 Unspecified psychosis not due to a substance or known physiological condition: Secondary | ICD-10-CM | POA: Diagnosis not present

## 2016-05-29 DIAGNOSIS — R4183 Borderline intellectual functioning: Secondary | ICD-10-CM | POA: Diagnosis not present

## 2016-05-29 DIAGNOSIS — Z888 Allergy status to other drugs, medicaments and biological substances status: Secondary | ICD-10-CM | POA: Diagnosis not present

## 2016-05-29 DIAGNOSIS — F918 Other conduct disorders: Secondary | ICD-10-CM | POA: Diagnosis not present

## 2016-05-29 LAB — CK: Total CK: 710 U/L — ABNORMAL HIGH (ref 38–234)

## 2016-05-29 MED ORDER — CLOZAPINE 100 MG PO TABS: 300.0000 mg | ORAL_TABLET | Freq: Two times a day (BID) | ORAL | 0 refills | Status: DC

## 2016-05-29 MED ORDER — BENZTROPINE MESYLATE 0.5 MG PO TABS
0.5000 mg | ORAL_TABLET | Freq: Two times a day (BID) | ORAL | 0 refills | Status: DC
Start: 2016-05-29 — End: 2017-10-08

## 2016-05-29 MED ORDER — METOPROLOL SUCCINATE ER 50 MG PO TB24: 50.0000 mg | ORAL_TABLET | Freq: Every day | ORAL | 0 refills | Status: DC

## 2016-05-29 MED ORDER — DIVALPROEX SODIUM 500 MG PO DR TAB: 500.0000 mg | DELAYED_RELEASE_TABLET | Freq: Two times a day (BID) | ORAL | 0 refills | Status: DC

## 2016-05-29 NOTE — Consult Note (Signed)
[facility] Consult   Reason for Consult:  No medications with aggressive behaviors Referring Physician:  EDP Patient Identification: ARZELLA REHMANN MRN:  [id number] Principal Diagnosis: Schizoaffective disorder-chronic with exacerbation [facility]) Diagnosis:   Patient Active Problem List   Diagnosis Date Noted  . Schizoaffective disorder-chronic with exacerbation ([place]) [F25.8] 02/06/2014    Priority: High  . Aggressive behavior [R45.89] 08/16/2013    Priority: High  . 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 Psychoses [F29]   . 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 Elevated WBC count [D72.829] 04/07/2014  . Noncompliance with medication regimen [Z91.14] 04/07/2014  . Papular rash, generalized [R21] 09/01/2012  . Amenorrhea [N91.2] 04/25/2012  . ASC-cannot exclude HGSIL on Pap [R89.6] 02/15/2012  . Screening for malignant neoplasm of the cervix [Z12.4] 02/01/2012  . 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 Obesity, unspecified [E66.9] 02/10/2009  . TOBACCO USER [F17.200] 02/08/2009  . DIABETES MELLITUS [E11.9] 04/02/2008  . CERVICITIS, GONOCOCCAL, History of [A54.03] 01/09/2007  . TRICHOMONAL VAGINITIS [A59.01] 01/09/2007  . ECZEMA, ATOPIC DERMATITIS [L20.89] 06/30/2006    Total Time spent with patient: 30 minutes  Subjective:   AYISHA POL is a 31 y.o. female patient has stabilized.  HPI:  31 yo female who came to the ED after hearing voices and having suicidal ideations along with some aggression.  She had not had her medications for several days, medications restarted and she stabilized.  Calm and cooperative for the past twenty four hours.  Analiah denies suicidal ideations along with homicidal ideations and hallucinations.  Denies alcohol/drug abuse.  She would like to leave today and follow-up at outpatient.  Aleene is well known to this ED and providers, she is stable for discharge.  Past Psychiatric History: schizoaffective  disorder  Risk to Self: Suicidal Ideation: No Suicidal Intent: No Is patient at risk for suicide?: No Suicidal Plan?: No Access to Means: No What has been your use of drugs/alcohol within the last 12 months?:  (n/a) How many times?:  (0) Other Self Harm Risks:  (denies ) Triggers for Past Attempts: Other (Comment) (no prior attempts or gestures) Intentional Self Injurious Behavior: None Risk to Others: Homicidal Ideation: No Thoughts of Harm to Others: No Current Homicidal Intent: No Current Homicidal Plan: No Access to Homicidal Means: No Identified Victim:  (n/a) History of harm to others?: No Assessment of Violence: None Noted Violent Behavior Description:  ( calm and cooperative ) Does patient have access to weapons?: No Criminal Charges Pending?: No Does patient have a court date: No Prior Inpatient Therapy: Prior Inpatient Therapy: Yes Prior Therapy Dates:  (02/06/14 & 04/09/14-[facility]; 05/10/16 & 05/04/16-[facility]) Prior Therapy Facilty/Provider(s): [facility] Reason for Treatment:  (psychosis ) Prior Outpatient Therapy: Prior Outpatient Therapy:  (unk) Prior Therapy Dates: unk Prior Therapy Facilty/Provider(s):  (unk) Reason for Treatment:  (unk) Does patient have an ACCT team?: Unknown Does patient have Intensive In-House Services?  : Unknown Does patient have [facility] services? : Unknown Does patient have P4CC services?: Unknown  Past Medical History:  Past Medical History:  Diagnosis Date  . Abnormal Pap smear   . ASC-cannot exclude HGSIL on Pap 02/15/2012   ASC-US on 02/03/2012 pap (associated Trichomonas infection). No reflex HPV testing performed on specimen.  Patient informed that she will need repeat Pap in one year.      . ATTENTION DEFICIT, W/O HYPERACTIVITY, History of 06/30/2006   Qualifier: History of  By: McDiarmid MD, Sherren Mocha    . CERVICITIS, GONOCOCCAL, History  of 01/09/2007   Qualifier: History of  By: McDiarmid MD, Sherren Mocha    . CONDYLOMA ACUMINATA, HISTORY OF  05/12/2009   Qualifier: History of  By: McDiarmid MD, Sherren Mocha    . Depression   . Diabetes mellitus    diet controlled  . Eczema   . Schizophrenia ([place])   . 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 Henrene Dodge (Psych) At [facility] in  [place], [place]. Qualifier: Hospitalized for  By: McDiarmid MD, Sherren Mocha    . 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 TOBACCO USER 02/08/2009   Qualifier: Diagnosis of  By: Samara Snide      Past Surgical History:  Procedure Laterality Date  . INCISION AND DRAINAGE     pilanodal cyst   Family History:  Family History  Problem Relation Age of Onset  . Adopted: Yes  . Bipolar disorder Sister   . Alcohol abuse Brother   . Cancer Father   . Diabetes Mother    Family Psychiatric  History: none Social History:  History  Alcohol Use No    Comment: occ     History  Drug Use No    Social History   Social History  . Marital status: Single    Spouse name: N/A  . Number of children: N/A  . Years of education: N/A   Social History Main Topics  . Smoking status: Former Smoker    Packs/day: 0.50    Years: 10.00    Types: Cigarettes  . Smokeless tobacco: Never Used  . Alcohol use No     Comment: occ  . Drug use: No  . Sexual activity: Yes    Birth control/ protection: None   Other Topics Concern  . None   Social History Narrative   Adopted   Living in [place] home with [name]   Transportation: Clinical biochemist Social History:    Allergies:   Allergies  Allergen Reactions  . Abilify [Aripiprazole] Other (See Comments)    Thinks it's nasty- does not want it.  Injection is ok.      Labs:  Results for orders placed or performed during the hospital encounter of 05/27/16 (from the past 48 hour(s))  Rapid urine drug screen (hospital performed)     Status: None   Collection Time: 05/27/16  2:30 PM  Result Value Ref Range    Opiates NONE DETECTED NONE DETECTED   Cocaine NONE DETECTED NONE DETECTED   Benzodiazepines NONE DETECTED NONE DETECTED   Amphetamines NONE DETECTED NONE DETECTED   Tetrahydrocannabinol NONE DETECTED NONE DETECTED   Barbiturates NONE DETECTED NONE DETECTED    Comment:        DRUG SCREEN FOR MEDICAL PURPOSES ONLY.  IF CONFIRMATION IS NEEDED FOR ANY PURPOSE, NOTIFY LAB WITHIN 5 DAYS.        LOWEST DETECTABLE LIMITS FOR URINE DRUG SCREEN Drug Class       Cutoff (ng/mL) Amphetamine      1000 Barbiturate      200 Benzodiazepine   290 Tricyclics       211 Opiates          300 Cocaine          300 THC              50   Comprehensive metabolic panel     Status: Abnormal   Collection Time: 05/27/16  2:49 PM  Result Value Ref Range   Sodium 139 135 - 145 mmol/L   Potassium 3.6 3.5 - 5.1 mmol/L   Chloride 108 101 - 111 mmol/L   CO2 23 22 - 32 mmol/L   Glucose, Bld 118 (H) 65 - 99 mg/dL   BUN 8 6 - 20 mg/dL   Creatinine, Ser 0.61 0.44 - 1.00 mg/dL   Calcium 8.9 8.9 - 10.3 mg/dL   Total Protein 7.7 6.5 - 8.1 g/dL   Albumin 3.9 3.5 - 5.0 g/dL   AST 66 (H) 15 - 41 U/L   ALT 31 14 - 54 U/L   Alkaline Phosphatase 77 38 - 126 U/L   Total Bilirubin 0.4 0.3 - 1.2 mg/dL   GFR calc non Af Amer >60 >60 mL/min   GFR calc Af Amer >60 >60 mL/min    Comment: (NOTE) The eGFR has been calculated using the CKD EPI equation. This calculation has not been validated in all clinical situations. eGFR's persistently <60 mL/min signify possible Chronic Kidney Disease.    Anion gap 8 5 - 15  Ethanol     Status: None   Collection Time: 05/27/16  2:49 PM  Result Value Ref Range   Alcohol, Ethyl (B) <5 <5 mg/dL    Comment:        LOWEST DETECTABLE LIMIT FOR SERUM ALCOHOL IS 5 mg/dL FOR MEDICAL PURPOSES ONLY   cbc     Status: Abnormal   Collection Time: 05/27/16  2:49 PM  Result Value Ref Range   WBC 11.9 (H) 4.0 - 10.5 K/uL   RBC 4.07 3.87 - 5.11 MIL/uL   Hemoglobin 11.4 (L) 12.0 - 15.0 g/dL    HCT 33.8 (L) 36.0 - 46.0 %   MCV 83.0 78.0 - 100.0 fL   MCH 28.0 26.0 - 34.0 pg   MCHC 33.7 30.0 - 36.0 g/dL   RDW 16.7 (H) 11.5 - 15.5 %   Platelets 271 150 - 400 K/uL  Urinalysis, Routine w reflex microscopic     Status: Abnormal   Collection Time: 05/28/16  4:52 PM  Result Value Ref Range   Color, Urine YELLOW YELLOW   APPearance HAZY (A) CLEAR   Specific Gravity, Urine 1.017 1.005 - 1.030   pH 7.0 5.0 - 8.0   Glucose, UA NEGATIVE NEGATIVE mg/dL   Hgb urine dipstick NEGATIVE NEGATIVE   Bilirubin Urine NEGATIVE NEGATIVE   Ketones, ur 5 (A) NEGATIVE mg/dL   Protein, ur NEGATIVE NEGATIVE mg/dL   Nitrite NEGATIVE NEGATIVE   Leukocytes, UA NEGATIVE NEGATIVE  POC urine preg, ED (not at [facility])     Status: None   Collection Time: 05/28/16  5:25 PM  Result Value Ref Range   Preg Test, Ur NEGATIVE NEGATIVE    Comment:        THE SENSITIVITY OF THIS METHODOLOGY IS >24 mIU/mL     Current Facility-Administered Medications  Medication Dose Route Frequency Provider Last Rate Last Dose  . acetaminophen (TYLENOL) tablet 650 mg  650 mg Oral Q4H PRN Margarita Mail, PA-C   650 mg at 05/28/16 1454  . alum & mag hydroxide-simeth (MAALOX/MYLANTA) 200-200-20 MG/5ML suspension 30 mL  30 mL Oral PRN Margarita Mail, PA-C      . benztropine (COGENTIN) tablet 0.5 mg  0.5 mg Oral BID Corena Pilgrim, MD   0.5 mg at 05/28/16 2203  . cloZAPine (CLOZARIL) tablet 300 mg  300 mg Oral BID Margarita Mail, PA-C   300 mg at 05/28/16 2203  .  divalproex (DEPAKOTE) DR tablet 500 mg  500 mg Oral BID Margarita Mail, PA-C   500 mg at 05/28/16 2203  . famotidine (PEPCID) tablet 10 mg  10 mg Oral Daily Margarita Mail, PA-C   10 mg at 05/28/16 1011  . hydrOXYzine (ATARAX/VISTARIL) tablet 50 mg  50 mg Oral QHS PRN Charlesetta Shanks, MD      . ibuprofen (ADVIL,MOTRIN) tablet 600 mg  600 mg Oral Q8H PRN Margarita Mail, PA-C      . metoprolol succinate (TOPROL-XL) 24 hr tablet 50 mg  50 mg Oral Daily Margarita Mail, PA-C   50 mg  at 05/28/16 1015  . nicotine (NICODERM CQ - dosed in mg/24 hours) patch 21 mg  21 mg Transdermal Daily Margarita Mail, PA-C   21 mg at 05/28/16 1011  . ondansetron (ZOFRAN) tablet 4 mg  4 mg Oral Q8H PRN Margarita Mail, PA-C      . polyethylene glycol (MIRALAX / GLYCOLAX) packet 17 g  17 g Oral Daily Abigail Harris, PA-C      . traZODone (DESYREL) tablet 100 mg  100 mg Oral QHS PRN Corena Pilgrim, MD       Current Outpatient Prescriptions  Medication Sig Dispense Refill  . alum & mag hydroxide-simeth (MAALOX/MYLANTA) 200-200-20 MG/5ML suspension Take 30 mLs by mouth every 6 (six) hours as needed for indigestion or heartburn.    . cholecalciferol (VITAMIN D) 1000 units tablet Take 2,000 Units by mouth daily.    . clonazePAM (KLONOPIN) 1 MG tablet Take 1 mg by mouth as directed.    . cloZAPine (CLOZARIL) 100 MG tablet Take 300 mg by mouth 2 (two) times daily.    . divalproex (DEPAKOTE) 500 MG DR tablet Take 500 mg by mouth 2 (two) times daily.    . ferrous sulfate 325 (65 FE) MG tablet Take 325 mg by mouth daily with breakfast.    . haloperidol (HALDOL) 1 MG tablet Take 1 mg by mouth as directed.    . linaclotide (LINZESS) 72 MCG capsule Take 72 mcg by mouth daily before breakfast.    . metoprolol succinate (TOPROL-XL) 50 MG 24 hr tablet Take 50 mg by mouth daily. Take with or immediately following a meal.    . Multiple Vitamins-Iron (MULTIVITAMINS WITH IRON) TABS tablet Take 1 tablet by mouth daily.    . polyethylene glycol (MIRALAX / GLYCOLAX) packet Take 17 g by mouth daily.    . ranitidine (ZANTAC) 150 MG tablet Take 150 mg by mouth 2 (two) times daily.    Marland Kitchen senna (SENOKOT) 8.6 MG TABS tablet Take 1 tablet by mouth.    . vitamin C (ASCORBIC ACID) 500 MG tablet Take 500 mg by mouth daily.    Marland Kitchen zolpidem (AMBIEN) 10 MG tablet Take 10 mg by mouth at bedtime as needed for sleep.    Marland Kitchen acetaminophen (TYLENOL) 500 MG tablet Take 500 mg by mouth every 6 (six) hours as needed for headache (headache).     . benztropine (COGENTIN) 0.5 MG tablet Take 1 tablet (0.5 mg total) by mouth 2 (two) times daily. (Patient not taking: Reported on 05/27/2016) 60 tablet 0  . benztropine mesylate (COGENTIN) 1 MG/ML injection Inject 0.5 mLs (0.5 mg total) into the muscle every 30 (thirty) days. First dose received on 04/16/15 with next dose due in thirty days. (Patient not taking: Reported on 05/04/2016) 2 mL 0  . doxycycline (VIBRAMYCIN) 100 MG capsule Take 1 capsule (100 mg total) by mouth 2 (two) times daily. One po  bid x 7 days (Patient not taking: Reported on 05/27/2016) 14 capsule 0  . haloperidol decanoate (HALDOL DECANOATE) 100 MG/ML injection Inject 1 mL (100 mg total) into the muscle every 30 (thirty) days. (Patient not taking: Reported on 05/04/2016) 1 mL 0  . hydrOXYzine (VISTARIL) 50 MG capsule Take 50 mg by mouth at bedtime as needed.    . medroxyPROGESTERone (DEPO-PROVERA) 150 MG/ML injection Inject 150 mg into the muscle every 3 (three) months.    . traZODone (DESYREL) 100 MG tablet Take 1 tablet (100 mg total) by mouth at bedtime. (Patient not taking: Reported on 05/04/2016) 30 tablet 0    Musculoskeletal: Strength & Muscle Tone: within normal limits Gait & Station: normal Patient leans: N/A  Psychiatric Specialty Exam: Physical Exam  Constitutional: She is oriented to person, place, and time. She appears well-developed and well-nourished.  HENT:  Head: Normocephalic.  Neck: Normal range of motion.  Respiratory: Effort normal.  Musculoskeletal: Normal range of motion.  Neurological: She is alert and oriented to person, place, and time.  Psychiatric: She has a normal mood and affect. Her speech is normal and behavior is normal. Judgment and thought content normal. Cognition and memory are normal.    Review of Systems  All other systems reviewed and are negative.   Blood pressure 124/83, pulse 107, temperature 98.9 F (37.2 C), temperature source Oral, resp. rate 20, SpO2 96 %, unknown if  currently breastfeeding.There is no height or weight on file to calculate BMI.  General Appearance: Casual  Eye Contact:  Good  Speech:  Normal Rate  Volume:  Normal  Mood:  Euthymic  Affect:  Congruent  Thought Process:  Coherent and Descriptions of Associations: Intact  Orientation:  Full (Time, Place, and Person)  Thought Content:  WDL and Logical  Suicidal Thoughts:  No  Homicidal Thoughts:  No  Memory:  Immediate;   Good Recent;   Good Remote;   Good  Judgement:  Fair  Insight:  Fair  Psychomotor Activity:  Normal  Concentration:  Concentration: Good and Attention Span: Good  Recall:  Good  Fund of Knowledge:  Fair  Language:  Good  Akathisia:  No  Handed:  Right  AIMS (if indicated):     Assets:  Leisure Time Physical Health Resilience Social Support  ADL's:  Intact  Cognition:  WNL  Sleep:        Treatment Plan Summary: Daily contact with patient to assess and evaluate symptoms and progress in treatment, Medication management and Plan schizoaffective disorder, bipolar type:  -Crisis stabilization -Medication management:  REstarted Clozaril 300 mg BID for psychosis, Cogentin 0.5 mg BID for EPS, Depakote 500 mg BID for mood stabilization, Vistaril 50 mg at bedtime PRN anxiety, and Trazodone 100 mg at bedtime PRN sleep -Individual counseling  Disposition: No evidence of imminent risk to self or others at present.    Waylan Boga, NP 05/29/2016 8:49 AM   Case discussed/reviewed in rounds with NP , ED team. Agree with note

## 2016-05-29 NOTE — Progress Notes (Signed)
Per patient's NP request CSW contacted patient's ACT Team ([facility] [phone]) and spoke with staff named Tobi BastosAnna. Staff reported that the person on call would return CSW phone call. CSW informed staff that patient was being discharged.   Per patient's NP request CSW contacted patient's guardian [name]([name] [phone]). CSW informed patient's legal guardian that patient was being discharged and her medications were re-started. Patient's legal guardian reported that patient is not medication compliant and requested to contact CSW after contacting her supervisor. Patient's legal guardian reported that patient does not have an ACTT team and that patient receives community support services from [facility].  CSW received phone call from [facility]. Staff reported that patient has not been receiving any services for over 1 year.   CSW attempted to contact patient's legal guardian to clarify information received from [facility], no answer. CSW awaiting return phone call.

## 2016-05-29 NOTE — [facility] Suicide Risk Assessment (Signed)
Suicide Risk Assessment  Discharge Assessment   [facility] Discharge Suicide Risk Assessment   Principal Problem: Schizoaffective disorder-chronic with exacerbation [facility]) Discharge Diagnoses:  Patient Active Problem List   Diagnosis Date Noted  . Schizoaffective disorder-chronic with exacerbation ([facility]) [F25.8] 02/06/2014    Priority: High  . Aggressive behavior [R45.89] 08/16/2013    Priority: High  . Disorganized schizophrenia ([facility]) [F20.1]   . Psychoses [F29]   . 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 Elevated WBC count [D72.829] 04/07/2014  . Noncompliance with medication regimen [Z91.14] 04/07/2014  . Papular rash, generalized [R21] 09/01/2012  . Amenorrhea [N91.2] 04/25/2012  . ASC-cannot exclude HGSIL on Pap [R89.6] 02/15/2012  . Screening for malignant neoplasm of the cervix [Z12.4] 02/01/2012  . 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 Obesity, unspecified [E66.9] 02/10/2009  . TOBACCO USER [F17.200] 02/08/2009  . DIABETES MELLITUS [E11.9] 04/02/2008  . CERVICITIS, GONOCOCCAL, History of [A54.03] 01/09/2007  . TRICHOMONAL VAGINITIS [A59.01] 01/09/2007  . ECZEMA, ATOPIC DERMATITIS [L20.89] 06/30/2006    Total Time spent with patient: 30 minutes  Musculoskeletal: Strength & Muscle Tone: within normal limits Gait & Station: normal Patient leans: N/A  Psychiatric Specialty Exam: Physical Exam  Constitutional: She is oriented to person, place, and time. She appears well-developed and well-nourished.  HENT:  Head: Normocephalic.  Neck: Normal range of motion.  Respiratory: Effort normal.  Musculoskeletal: Normal range of motion.  Neurological: She is alert and oriented to person, place, and time.  Psychiatric: She has a normal mood and affect. Her speech is normal and behavior is normal. Judgment and thought content normal. Cognition and memory are normal.    Review of Systems  All other systems reviewed and are negative.   Blood pressure 124/83,  pulse 107, temperature 98.9 F (37.2 C), temperature source Oral, resp. rate 20, SpO2 96 %, unknown if currently breastfeeding.There is no height or weight on file to calculate BMI.  General Appearance: Casual  Eye Contact:  Good  Speech:  Normal Rate  Volume:  Normal  Mood:  Euthymic  Affect:  Congruent  Thought Process:  Coherent and Descriptions of Associations: Intact  Orientation:  Full (Time, Place, and Person)  Thought Content:  WDL and Logical  Suicidal Thoughts:  No  Homicidal Thoughts:  No  Memory:  Immediate;   Good Recent;   Good Remote;   Good  Judgement:  Fair  Insight:  Fair  Psychomotor Activity:  Normal  Concentration:  Concentration: Good and Attention Span: Good  Recall:  Good  Fund of Knowledge:  Fair  Language:  Good  Akathisia:  No  Handed:  Right  AIMS (if indicated):     Assets:  Leisure Time Physical Health Resilience Social Support  ADL's:  Intact  Cognition:  WNL  Sleep:       Mental Status Per Nursing Assessment::   On Admission:   aggression  Demographic Factors:  Adolescent or young adult  Loss Factors: NA  Historical Factors: NA  Risk Reduction Factors:   Sense of responsibility to family, Positive social support and Positive therapeutic relationship  Continued Clinical Symptoms:  None  Cognitive Features That Contribute To Risk:  None    Suicide Risk:  Minimal: No identifiable suicidal ideation.  Patients presenting with no risk factors but with morbid ruminations; may be classified as minimal risk based on the severity of the depressive symptoms  Follow-up Information    [facility]. Schedule an appointment as soon as possible for a visit.  Specialty:  Family Medicine Contact information: [address] [id number] [place] [id number] [phone]          Plan Of Care/Follow-up recommendations:  Activity:  as tolerated Diet:  heart healthy diet  Artrice Kraker,  NP 05/29/2016, 9:01 AM

## 2016-05-29 NOTE — ED Notes (Signed)
Caregiver informed of repeat CK level check

## 2016-05-29 NOTE — Progress Notes (Signed)
CSW received return phone call from patient's legal guardian Molly [name](Molly [name] [phone]). Patient's legal guardian reported that she got in contact with patient's CST worker Molly [name](Molly [name] [phone]) that agreed to pick patient up at discharge. Patient's legal guardian reported that the patient receives community support services from psychotherapeutic services. Patient's legal guardian requested that patient not be discharged until someone arrives to pick patient up so that patient can be set up with new housing due to losing bed at [facility]. CSW thanked patient's legal guardian for information and agreed to notify psychiatrist and NP about request.

## 2016-05-29 NOTE — Progress Notes (Signed)
CSW filed patient's Notice of Commitment Change paperwork in IVC log book.

## 2016-05-29 NOTE — Progress Notes (Signed)
CSW contacted by patient's legal guardian. Patient's legal guardian informed CSW that she did not have access to patient's finances to pay for patient's medications. CSW notified patient's legal guardian that CSW would contact Case Management to inquire about any resources that may be available to assist patient.   CSW contacted and left voice message informing CM about patient's needs.   CSW contacted by CM, CM notified CSW that due to patient's insurance no resources were available to assist patient with paying copay.  CSW contacted patient's legal guardian and informed her that there were no resources available to assist patient with paying copay for medications. Patient's legal guardian reported that she spoke with CM and was awaiting a return phone call from CM.   CSW contacted patient's legal guardian to inquire if she received a return call from CM. Patient's guardian reported that she spoke with CM and was in route to pick up patient.

## 2016-05-29 NOTE — Care Management Note (Signed)
Case Management Note  Patient Details  Name: Levester FreshJessica S Solan MRN: [id number] Date of Birth: 03/21/1986  Subjective/Objective:  Paranoid/catatonic schizophrenia                  Action/Plan: Discharge Planning: AVS reviewed: Consulted to assist with medication. Pt has Medicaid that pays for meds. She has a 1.70 to 3.00 copay for meds. Spoke to [employer] Legal Guardian, [name] and reports she has located a boarding house for pt to live. States working on getting boarding house ready.   [facility]  Expected Discharge Date:  05/29/2016              Expected Discharge Plan:  Home/Self Care  In-House Referral:  Clinical Social Work  Discharge planning Services  CM Consult  Post Acute Care Choice:  NA Choice offered to:  NA  DME Arranged:  N/A DME Agency:  NA  HH Arranged:  NA HH Agency:  NA  Status of Service:  Completed, signed off  If discussed at [employer] Length of Stay Meetings, dates discussed:    Additional Comments:  Elliot CousinShavis, Shelanda Duvall Ellen, RN 05/29/2016, 11:16 AM

## 2016-06-02 DIAGNOSIS — Z79899 Other long term (current) drug therapy: Secondary | ICD-10-CM | POA: Diagnosis not present

## 2016-06-02 DIAGNOSIS — F2089 Other schizophrenia: Secondary | ICD-10-CM | POA: Diagnosis not present

## 2016-06-03 DIAGNOSIS — F2089 Other schizophrenia: Secondary | ICD-10-CM | POA: Diagnosis not present

## 2016-06-09 DIAGNOSIS — F2089 Other schizophrenia: Secondary | ICD-10-CM | POA: Diagnosis not present

## 2016-07-07 DIAGNOSIS — F2089 Other schizophrenia: Secondary | ICD-10-CM | POA: Diagnosis not present

## 2016-07-21 IMAGING — CR DG FINGER MIDDLE 2+V*R*
3 series · 3 of 3 positions shown · non-contrast
Comparison: None.

CLINICAL DATA: Swelling and erythema to the right knee pain.
Questionable splinter.

EXAM:
RIGHT MIDDLE FINGER 2+V

[x finger pa right]
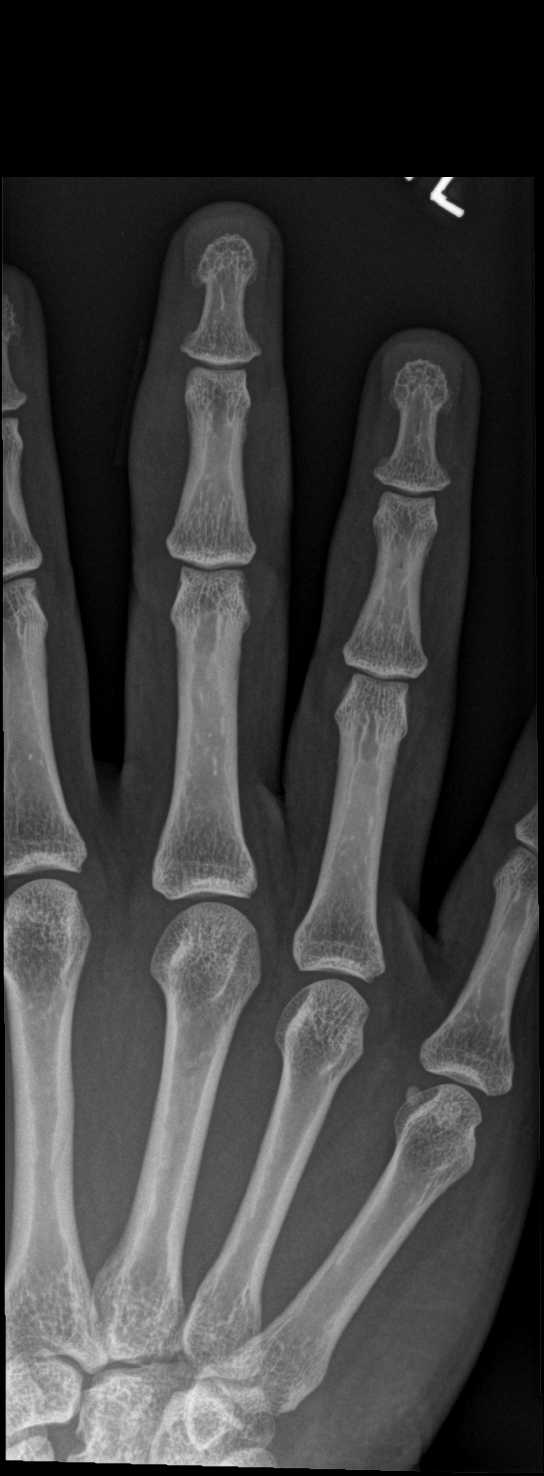

[x finger obl right]
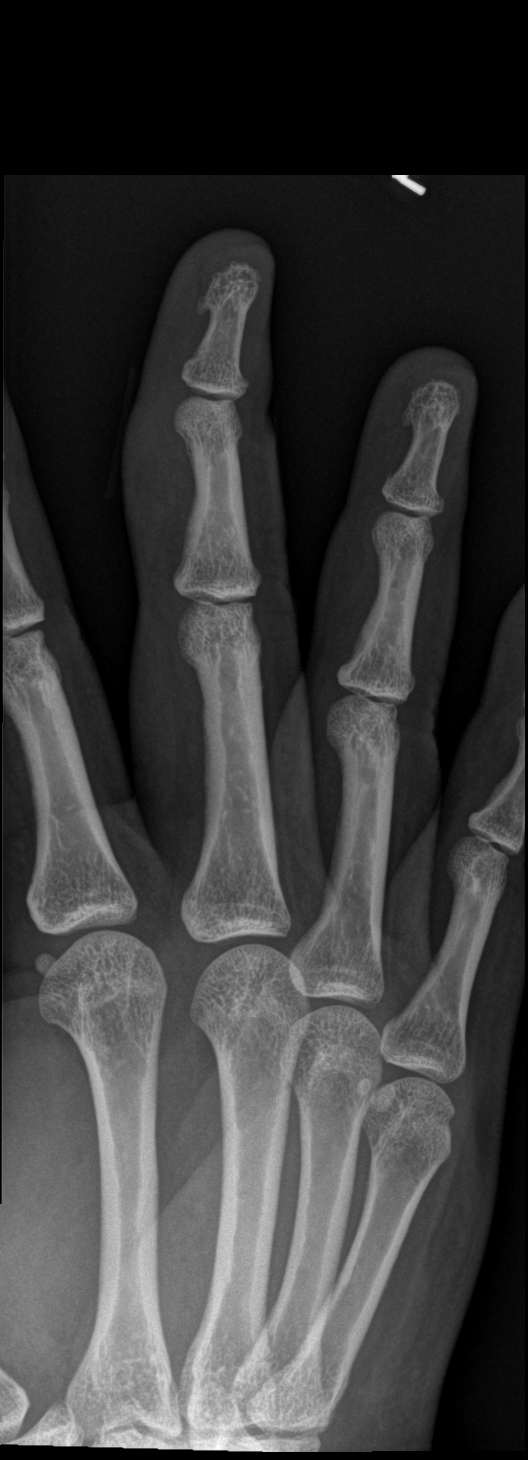

[x finger lat right]
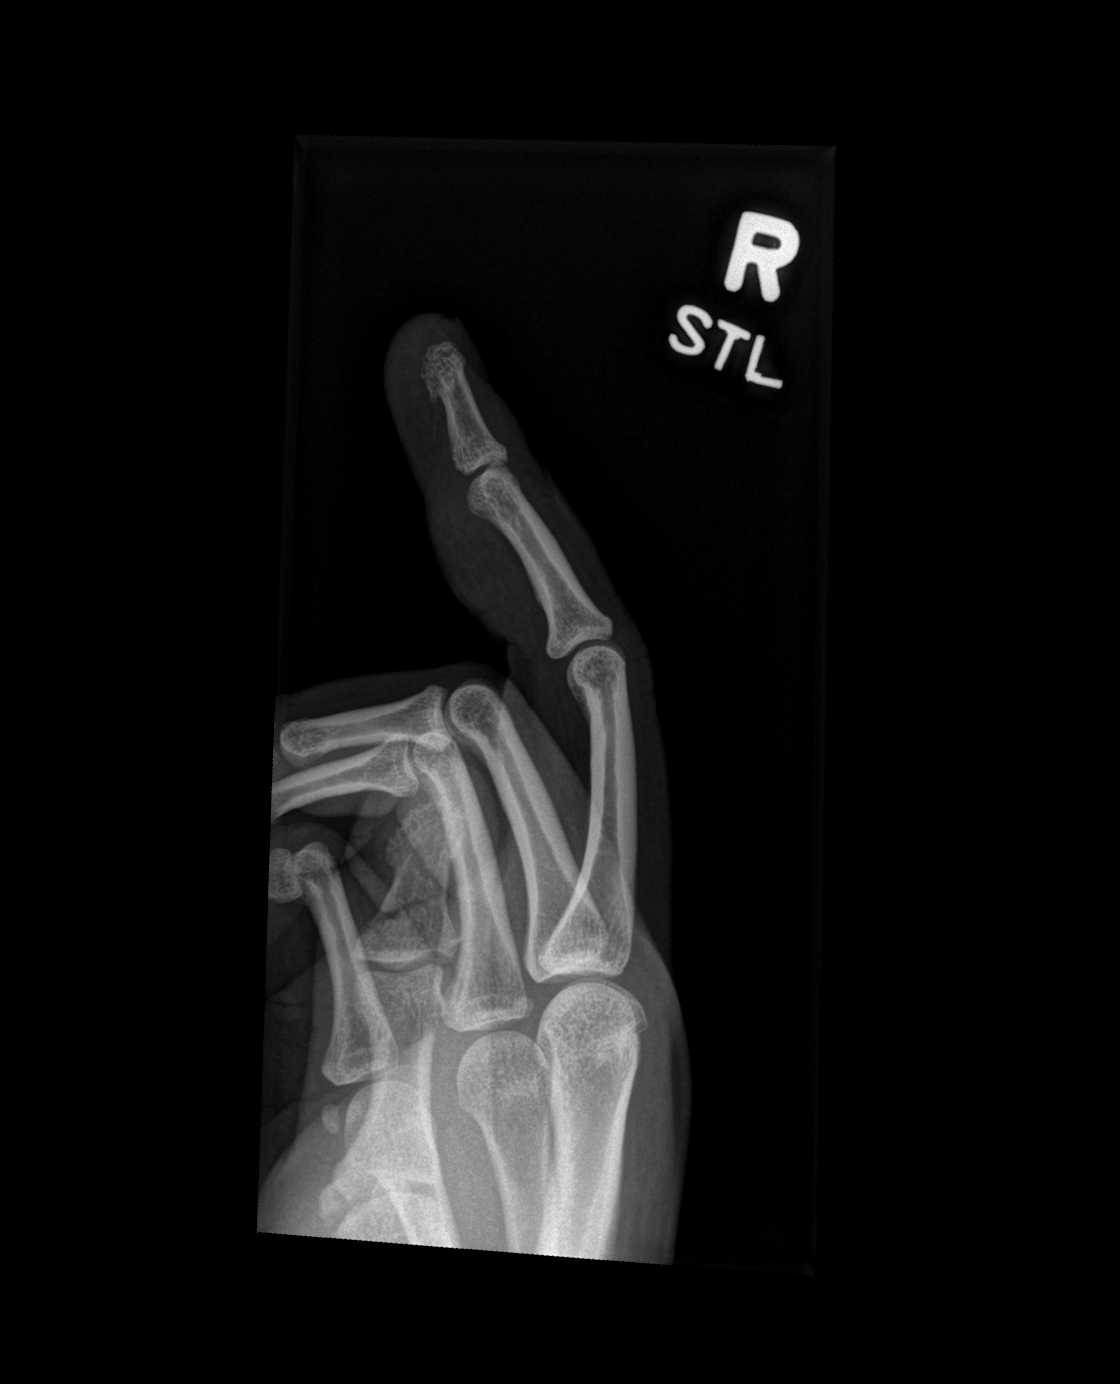

[3 of 3 positions shown; findings below may reference images not displayed]

FINDINGS: Soft tissue swelling is seen along the palmar, radial margin of the
right middle finger, at the level of the middle phalanx. There is no
radiopaque foreign body. No soft tissue air.

No fracture. No bone lesion. No bone resorption is seen to suggest
osteomyelitis. Joints are normally spaced and aligned.
IMPRESSION: No fracture or dislocation. Soft tissue swelling noted, but no
evidence of a radiopaque foreign body and no soft tissue air.

## 2016-07-29 DIAGNOSIS — B369 Superficial mycosis, unspecified: Secondary | ICD-10-CM | POA: Diagnosis not present

## 2016-07-29 DIAGNOSIS — N390 Urinary tract infection, site not specified: Secondary | ICD-10-CM | POA: Diagnosis not present

## 2016-08-04 DIAGNOSIS — N390 Urinary tract infection, site not specified: Secondary | ICD-10-CM | POA: Diagnosis not present

## 2016-08-04 DIAGNOSIS — Z79899 Other long term (current) drug therapy: Secondary | ICD-10-CM | POA: Diagnosis not present

## 2016-08-04 DIAGNOSIS — F2089 Other schizophrenia: Secondary | ICD-10-CM | POA: Diagnosis not present

## 2016-08-04 DIAGNOSIS — B369 Superficial mycosis, unspecified: Secondary | ICD-10-CM | POA: Diagnosis not present

## 2016-08-30 DIAGNOSIS — K439 Ventral hernia without obstruction or gangrene: Secondary | ICD-10-CM | POA: Diagnosis not present

## 2016-08-30 DIAGNOSIS — L853 Xerosis cutis: Secondary | ICD-10-CM | POA: Diagnosis not present

## 2016-08-30 DIAGNOSIS — N3941 Urge incontinence: Secondary | ICD-10-CM | POA: Diagnosis not present

## 2016-08-30 DIAGNOSIS — L732 Hidradenitis suppurativa: Secondary | ICD-10-CM | POA: Diagnosis not present

## 2016-11-15 DIAGNOSIS — L732 Hidradenitis suppurativa: Secondary | ICD-10-CM | POA: Diagnosis not present

## 2016-11-15 DIAGNOSIS — N3941 Urge incontinence: Secondary | ICD-10-CM | POA: Diagnosis not present

## 2016-11-15 DIAGNOSIS — R000 Tachycardia, unspecified: Secondary | ICD-10-CM | POA: Diagnosis not present

## 2016-11-30 DIAGNOSIS — Z79899 Other long term (current) drug therapy: Secondary | ICD-10-CM | POA: Diagnosis not present

## 2016-11-30 DIAGNOSIS — F250 Schizoaffective disorder, bipolar type: Secondary | ICD-10-CM | POA: Diagnosis not present

## 2017-01-05 DIAGNOSIS — F250 Schizoaffective disorder, bipolar type: Secondary | ICD-10-CM | POA: Diagnosis not present

## 2017-01-05 DIAGNOSIS — Z79899 Other long term (current) drug therapy: Secondary | ICD-10-CM | POA: Diagnosis not present

## 2017-02-04 DIAGNOSIS — Z79899 Other long term (current) drug therapy: Secondary | ICD-10-CM | POA: Diagnosis not present

## 2017-02-04 DIAGNOSIS — F250 Schizoaffective disorder, bipolar type: Secondary | ICD-10-CM | POA: Diagnosis not present

## 2017-03-09 DIAGNOSIS — F250 Schizoaffective disorder, bipolar type: Secondary | ICD-10-CM | POA: Diagnosis not present

## 2017-03-09 DIAGNOSIS — Z79899 Other long term (current) drug therapy: Secondary | ICD-10-CM | POA: Diagnosis not present

## 2017-03-18 DIAGNOSIS — R222 Localized swelling, mass and lump, trunk: Secondary | ICD-10-CM | POA: Diagnosis not present

## 2017-03-18 DIAGNOSIS — Z0000 Encounter for general adult medical examination without abnormal findings: Secondary | ICD-10-CM | POA: Diagnosis not present

## 2017-03-18 DIAGNOSIS — J4530 Mild persistent asthma, uncomplicated: Secondary | ICD-10-CM | POA: Diagnosis not present

## 2017-03-18 DIAGNOSIS — E109 Type 1 diabetes mellitus without complications: Secondary | ICD-10-CM | POA: Diagnosis not present

## 2017-03-18 DIAGNOSIS — R0602 Shortness of breath: Secondary | ICD-10-CM | POA: Diagnosis not present

## 2017-04-01 DIAGNOSIS — E668 Other obesity: Secondary | ICD-10-CM | POA: Diagnosis not present

## 2017-04-01 DIAGNOSIS — J9801 Acute bronchospasm: Secondary | ICD-10-CM | POA: Diagnosis not present

## 2017-04-01 DIAGNOSIS — F329 Major depressive disorder, single episode, unspecified: Secondary | ICD-10-CM | POA: Diagnosis not present

## 2017-04-01 DIAGNOSIS — E109 Type 1 diabetes mellitus without complications: Secondary | ICD-10-CM | POA: Diagnosis not present

## 2017-04-18 DIAGNOSIS — F250 Schizoaffective disorder, bipolar type: Secondary | ICD-10-CM | POA: Diagnosis not present

## 2017-04-18 DIAGNOSIS — Z79899 Other long term (current) drug therapy: Secondary | ICD-10-CM | POA: Diagnosis not present

## 2017-08-18 ENCOUNTER — Other Ambulatory Visit: Payer: Self-pay

## 2017-08-18 ENCOUNTER — Emergency Department (HOSPITAL_COMMUNITY)
Admission: EM | Admit: 2017-08-18 | Discharge: 2017-08-19 | Disposition: A | Discharge status: Home / Self Care | Payer: Medicare Other | Attending: Emergency Medicine | Admitting: Emergency Medicine

## 2017-08-18 ENCOUNTER — Encounter (HOSPITAL_COMMUNITY): Payer: Self-pay | Admitting: Emergency Medicine

## 2017-08-18 DIAGNOSIS — Z87891 Personal history of nicotine dependence: Secondary | ICD-10-CM | POA: Insufficient documentation

## 2017-08-18 DIAGNOSIS — Z79899 Other long term (current) drug therapy: Secondary | ICD-10-CM | POA: Insufficient documentation

## 2017-08-18 DIAGNOSIS — N644 Mastodynia: Secondary | ICD-10-CM | POA: Diagnosis present

## 2017-08-18 DIAGNOSIS — N611 Abscess of the breast and nipple: Secondary | ICD-10-CM | POA: Diagnosis not present

## 2017-08-18 DIAGNOSIS — E119 Type 2 diabetes mellitus without complications: Secondary | ICD-10-CM | POA: Diagnosis not present

## 2017-08-18 DIAGNOSIS — N61 Mastitis without abscess: Secondary | ICD-10-CM

## 2017-08-18 NOTE — ED Triage Notes (Signed)
Pt to ED via [facility] with c/o lump on right breast x's 3 days   Pt st's area is painful

## 2017-08-19 DIAGNOSIS — N611 Abscess of the breast and nipple: Secondary | ICD-10-CM | POA: Diagnosis not present

## 2017-08-19 LAB — COMPREHENSIVE METABOLIC PANEL
ALBUMIN: 3.2 g/dL — AB (ref 3.5–5.0)
ALT: 17 U/L (ref 14–54)
ANION GAP: 12 (ref 5–15)
AST: 19 U/L (ref 15–41)
Alkaline Phosphatase: 104 U/L (ref 38–126)
BILIRUBIN TOTAL: 0.4 mg/dL (ref 0.3–1.2)
BUN: 5 mg/dL — ABNORMAL LOW (ref 6–20)
CHLORIDE: 104 mmol/L (ref 101–111)
CO2: 19 mmol/L — AB (ref 22–32)
Calcium: 9.2 mg/dL (ref 8.9–10.3)
Creatinine, Ser: 0.74 mg/dL (ref 0.44–1.00)
GFR calc Af Amer: >60 mL/min (ref >60)
GFR calc non Af Amer: >60 mL/min (ref >60)
GLUCOSE: 131 mg/dL — AB (ref 65–99)
POTASSIUM: 3.6 mmol/L (ref 3.5–5.1)
SODIUM: 135 mmol/L (ref 135–145)
Total Protein: 7.4 g/dL (ref 6.5–8.1)

## 2017-08-19 LAB — CBC WITH DIFFERENTIAL/PLATELET
BASOS ABS: 0 10*3/uL (ref 0.0–0.1)
BASOS PCT: 0 %
EOS ABS: 0.2 10*3/uL (ref 0.0–0.7)
Eosinophils Relative: 1 %
HEMATOCRIT: 34.1 % — AB (ref 36.0–46.0)
Hemoglobin: 11.4 g/dL — ABNORMAL LOW (ref 12.0–15.0)
Lymphocytes Relative: 14 %
Lymphs Abs: 2.3 10*3/uL (ref 0.7–4.0)
MCH: 28 pg (ref 26.0–34.0)
MCHC: 33.4 g/dL (ref 30.0–36.0)
MCV: 83.8 fL (ref 78.0–100.0)
Monocytes Absolute: 2 10*3/uL — ABNORMAL HIGH (ref 0.1–1.0)
Monocytes Relative: 12 %
NEUTROS ABS: 11.4 10*3/uL — AB (ref 1.7–7.7)
NEUTROS PCT: 73 %
PLATELETS: 435 10*3/uL — AB (ref 150–400)
RBC: 4.07 MIL/uL (ref 3.87–5.11)
RDW: 15.2 % (ref 11.5–15.5)
WBC: 15.9 10*3/uL — ABNORMAL HIGH (ref 4.0–10.5)

## 2017-08-19 LAB — I-STAT BETA HCG BLOOD, ED (MC, WL, AP ONLY)

## 2017-08-19 LAB — I-STAT CG4 LACTIC ACID, ED: Lactic Acid, Venous: 1.56 mmol/L (ref 0.5–1.9)

## 2017-08-19 MED ORDER — CLINDAMYCIN HCL 150 MG PO CAPS: 300.0000 mg | ORAL_CAPSULE | Freq: Three times a day (TID) | ORAL | 0 refills | Status: AC

## 2017-08-19 MED ORDER — SODIUM CHLORIDE 0.9 % IV BOLUS
1000.0000 mL | Freq: Once | INTRAVENOUS | Status: AC
  Administered 2017-08-19: 1000 mL via INTRAVENOUS

## 2017-08-19 MED ORDER — VANCOMYCIN HCL 10 G IV SOLR
2500.0000 mg | Freq: Once | INTRAVENOUS | Status: AC
  Administered 2017-08-19: 2500 mg via INTRAVENOUS
  Filled 2017-08-19: qty 2500

## 2017-08-19 MED ORDER — VANCOMYCIN HCL IN DEXTROSE 1-5 GM/200ML-% IV SOLN: 1000.0000 mg | Freq: Three times a day (TID) | INTRAVENOUS | Status: DC

## 2017-08-19 NOTE — Consult Note (Signed)
Reason for Consult:breast abscess Referring Physician: Gareth Morgan  Molly [name] is an 32 y.o. female.  HPI: 32 yo female with pain in her right drain. She has had multiple abscesses most recently in her middle chest skin she has a cyst which has been intermittently infected. She also had a drainage procedure from pilonidal cyst in the past. She denies fevers at home. She has not been on antibiotics. The pain is constant and has been worsening.  Past Medical History:  Diagnosis Date  . Abnormal Pap smear   . ASC-cannot exclude HGSIL on Pap 02/15/2012   ASC-US on 02/03/2012 pap (associated Trichomonas infection). No reflex HPV testing performed on specimen.  Patient informed that she will need repeat Pap in one year.      . ATTENTION DEFICIT, W/O HYPERACTIVITY, History of 06/30/2006   Qualifier: History of  By: McDiarmid MD, Sherren Mocha    . CERVICITIS, GONOCOCCAL, History of 01/09/2007   Qualifier: History of  By: McDiarmid MD, Sherren Mocha    . 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 Depression   . Diabetes mellitus    diet controlled  . Eczema   . Schizophrenia ([place])   . 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 Henrene Dodge (Psych) At [facility] in  [place], [place]. Qualifier: Hospitalized for  By: McDiarmid MD, Sherren Mocha    . 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 TOBACCO USER 02/08/2009   Qualifier: Diagnosis of  By: Samara Snide      Past Surgical History:  Procedure Laterality Date  . INCISION AND DRAINAGE     pilanodal cyst    Family History  Adopted: Yes  Problem Relation Age of Onset  . Bipolar disorder Sister   . Alcohol abuse Brother   . Cancer Father   . Diabetes Mother     Social History:  reports that she has quit smoking. Her smoking use included cigarettes. She has a 5.00 pack-year smoking history. She has never used  smokeless tobacco. She reports that she does not drink alcohol or use drugs.  Allergies:  Allergies  Allergen Reactions  . Abilify [Aripiprazole] Other (See Comments)    Thinks it's nasty- does not want it.  Injection is ok.      Medications: I have reviewed the patient's current medications.  Results for orders placed or performed during the hospital encounter of 08/18/17 (from the past 48 hour(s))  CBC with Differential     Status: Abnormal   Collection Time: 08/19/17  8:10 AM  Result Value Ref Range   WBC 15.9 (H) 4.0 - 10.5 K/uL   RBC 4.07 3.87 - 5.11 MIL/uL   Hemoglobin 11.4 (L) 12.0 - 15.0 g/dL   HCT 34.1 (L) 36.0 - 46.0 %   MCV 83.8 78.0 - 100.0 fL   MCH 28.0 26.0 - 34.0 pg   MCHC 33.4 30.0 - 36.0 g/dL   RDW 15.2 11.5 - 15.5 %   Platelets 435 (H) 150 - 400 K/uL   Neutrophils Relative % 73 %   Neutro Abs 11.4 (H) 1.7 - 7.7 K/uL   Lymphocytes Relative 14 %   Lymphs Abs 2.3 0.7 - 4.0 K/uL   Monocytes Relative 12 %   Monocytes Absolute 2.0 (H) 0.1 - 1.0 K/uL   Eosinophils Relative 1 %   Eosinophils Absolute 0.2  0.0 - 0.7 K/uL   Basophils Relative 0 %   Basophils Absolute 0.0 0.0 - 0.1 K/uL    Comment: Performed at [name], [place] [address] [place], [place] [address]  Comprehensive metabolic panel     Status: Abnormal   Collection Time: 08/19/17  8:10 AM  Result Value Ref Range   Sodium 135 135 - 145 mmol/L   Potassium 3.6 3.5 - 5.1 mmol/L   Chloride 104 101 - 111 mmol/L   CO2 19 (L) 22 - 32 mmol/L   Glucose, Bld 131 (H) 65 - 99 mg/dL   BUN 5 (L) 6 - 20 mg/dL   Creatinine, Ser 0.74 0.44 - 1.00 mg/dL   Calcium 9.2 8.9 - 10.3 mg/dL   Total Protein 7.4 6.5 - 8.1 g/dL   Albumin 3.2 (L) 3.5 - 5.0 g/dL   AST 19 15 - 41 U/L   ALT 17 14 - 54 U/L   Alkaline Phosphatase 104 38 - 126 U/L   Total Bilirubin 0.4 0.3 - 1.2 mg/dL   GFR calc non Af Amer >60 >60 mL/min   GFR calc Af Amer >60 >60 mL/min    Comment: (NOTE) The eGFR has been calculated using the CKD EPI  equation. This calculation has not been validated in all clinical situations. eGFR's persistently <60 mL/min signify possible Chronic Kidney Disease.    Anion gap 12 5 - 15    Comment: Performed at [facility] [address] [place], [place] [address]  I-Stat CG4 Lactic Acid, ED     Status: None   Collection Time: 08/19/17  9:50 AM  Result Value Ref Range   Lactic Acid, Venous 1.56 0.5 - 1.9 mmol/L  I-Stat Beta hCG blood, ED ([facility], [facility], [facility] only)     Status: None   Collection Time: 08/19/17 10:11 AM  Result Value Ref Range   I-stat hCG, quantitative <5.0 <5 mIU/mL   Comment 3            Comment:   GEST. AGE      CONC.  (mIU/mL)   <=1 WEEK        5 - 50     2 WEEKS       50 - 500     3 WEEKS       100 - 10,000     4 WEEKS     1,000 - 30,000        FEMALE AND NON-PREGNANT FEMALE:     LESS THAN 5 mIU/mL     No results found.  Review of Systems  Constitutional: Negative for chills and fever.  HENT: Negative for hearing loss.   Eyes: Negative for blurred vision and double vision.  Respiratory: Negative for cough and hemoptysis.   Cardiovascular: Negative for chest pain and palpitations.  Gastrointestinal: Negative for abdominal pain, nausea and vomiting.  Genitourinary: Negative for dysuria and urgency.  Musculoskeletal: Negative for myalgias and neck pain.  Skin: Positive for rash. Negative for itching.  Neurological: Negative for dizziness, tingling and headaches.  Endo/Heme/Allergies: Does not bruise/bleed easily.  Psychiatric/Behavioral: Negative for depression and suicidal ideas.   Blood pressure 122/81, pulse (!) 115, temperature 98.3 F (36.8 C), temperature source Oral, resp. rate 14, height _0  (1.727 m), weight 127 kg (280 lb), SpO2 99 %, unknown if currently breastfeeding. Physical Exam  Vitals reviewed. Constitutional: She is oriented to person, place, and time. She appears well-developed and well-nourished.  HENT:  Head: Normocephalic and  atraumatic.   Eyes: Pupils are equal, round, and reactive to light. Conjunctivae and EOM are normal.  Neck: Normal range of motion. Neck supple.  Cardiovascular: Normal rate and regular rhythm.  Respiratory: Effort normal and breath sounds normal.  GI: Soft. Bowel sounds are normal. She exhibits no distension. There is no tenderness.  Musculoskeletal: Normal range of motion.  Neurological: She is alert and oriented to person, place, and time.  Skin: Skin is warm and dry.  Right medial breast with edema and swelling concerning for abscess, small cyst over the manubrium with white drainage  Psychiatric: She has a normal mood and affect. Her behavior is normal.    Assessment/Plan: 32 yo female with right breast abscess. -I recommend antibiotics and needle aspiration. I discussed this plan with the patient and she refused the needle aspiration procedure. Because of this I recommend outpatient antibiotics and follow up with the breast center for imaging and needle aspiration on Monday.  Arta Bruce Kinsinger 08/19/2017, 1:19 PM

## 2017-08-19 NOTE — Discharge Planning (Signed)
EDCM left voicemail for legal guardian Molly [name]([name] [phone]) to inform her of pt wishes to leave AMA.

## 2017-08-19 NOTE — Progress Notes (Signed)
Pharmacy Antibiotic Note  [name] is a 32 y.o. female admitted on 08/18/2017 with cellulitis around R breast. Planning to start empiric antibiotics. LA wnl, SCr 0.7, WBC 15.9.   Plan: -Vancomycin 2500 mg IV x1 then 1g/8h -Monitor renal fx, cultures, VT at steady state   Height: 5\' 8"  (172.7 cm) Weight: 280 lb (127 kg) IBW/kg (Calculated) : 63.9  Temp (24hrs), Avg:98.6 F (37 C), Min:98.3 F (36.8 C), Max:98.9 F (37.2 C)  Recent Labs  Lab 08/19/17 0810 08/19/17 0950  WBC 15.9*  --   LATICACIDVEN  --  1.56    CrCl cannot be calculated (Patient's most recent lab result is older than the maximum 21 days allowed.).    Allergies  Allergen Reactions  . Abilify [Aripiprazole] Other (See Comments)    Thinks it's nasty- does not want it.  Injection is ok.      Antimicrobials this admission: 4/19 vancomycin >  Dose adjustments this admission: N/A  Microbiology results: 4/19 blood cx:   Molly [name], Molly [name] M 08/19/2017 10:48 AM

## 2017-08-19 NOTE — ED Notes (Signed)
Pt has all belongings and has been told that it is against medical advice that she leaves.  Pt verbalizes understanding.

## 2017-08-19 NOTE — ED Provider Notes (Signed)
[facility] EMERGENCY [facility]MENT Provider Note   CSN: [id number] Arrival date & time: 08/18/17  2045     History   Chief Complaint Chief Complaint  Patient presents with  . Breast Pain    HPI Molly [name] is a 32 y.o. female.  HPI  32 year old female with a history of paranoid schizophrenia, diet-controlled diabetes, presents with concern for breast pain.  Reports area of breast pain and swelling has developed over the last 3-4 days.  Reports severe pain that is rated 10 out of 10 over the area of her right breast.  There is been no drainage from the area.  Reports she does have a history of abscess on her arms and in her groin before  Denies fevers, chills.  Reports she has had chronic nausea and vomiting for the last 2-3 years which is unchanged.  Past Medical History:  Diagnosis Date  . Abnormal Pap smear   . ASC-cannot exclude HGSIL on Pap 02/15/2012   ASC-US on 02/03/2012 pap (associated Trichomonas infection). No reflex HPV testing performed on specimen.  Patient informed that she will need repeat Pap in one year.      . ATTENTION DEFICIT, W/O HYPERACTIVITY, History of 06/30/2006   Qualifier: History of  By: McDiarmid MD, Tawanna Coolerodd    . CERVICITIS, GONOCOCCAL, History of 01/09/2007   Qualifier: History of  By: McDiarmid MD, Tawanna Coolerodd    . 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 Depression   . Diabetes mellitus    diet controlled  . Eczema   . Schizophrenia ([facility])   . 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 Dennie Bibleichard Larsen (Psych) At [facility] in  [place], [place]. Qualifier: Hospitalized for  By: McDiarmid MD, Tawanna Coolerodd    . 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 TOBACCO USER 02/08/2009   Qualifier: Diagnosis of  By: Knox Royaltydell, Safiyah Cisney      Patient Active Problem List   Diagnosis Date Noted  . Disorganized  schizophrenia (HCC)   . Psychoses (HCC)   . Borderline intellectual functioning   . Elevated WBC count 04/07/2014  . Noncompliance with medication regimen 04/07/2014  . Schizoaffective disorder-chronic with exacerbation (HCC) 02/06/2014  . Aggressive behavior 08/16/2013  . Papular rash, generalized 09/01/2012  . Amenorrhea 04/25/2012  . ASC-cannot exclude HGSIL on Pap 02/15/2012  . Screening for malignant neoplasm of the cervix 02/01/2012  . 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 Obesity, unspecified 02/10/2009  . TOBACCO USER 02/08/2009  . DIABETES MELLITUS 04/02/2008  . CERVICITIS, GONOCOCCAL, History of 01/09/2007  . TRICHOMONAL VAGINITIS 01/09/2007  . ECZEMA, ATOPIC DERMATITIS 06/30/2006    Past Surgical History:  Procedure Laterality Date  . INCISION AND DRAINAGE     pilanodal cyst     OB History    Gravida  3   Para  1   Term  1   Preterm      AB      Living  1     SAB      TAB      Ectopic      Multiple      Live Births               Home Medications    Prior to Admission medications   Medication Sig Start Date End Date Taking? Authorizing  Provider  acetaminophen (TYLENOL) 500 MG tablet Take 500 mg by mouth every 6 (six) hours as needed for headache (headache).   Yes [provider]  alum & mag hydroxide-simeth (MAALOX/MYLANTA) 200-200-20 MG/5ML suspension Take 30 mLs by mouth every 6 (six) hours as needed for indigestion or heartburn.   Yes [provider]  benztropine (COGENTIN) 0.5 MG tablet Take 1 tablet (0.5 mg total) by mouth 2 (two) times daily. 05/29/16  Yes Charm Rings, NP  cholecalciferol (VITAMIN D) 1000 units tablet Take 2,000 Units by mouth daily.   Yes [provider]  cloZAPine (CLOZARIL) 100 MG tablet Take 3 tablets (300 mg total) by mouth 2 (two) times daily. 05/29/16  Yes Charm Rings, NP  divalproex (DEPAKOTE ER) 500 MG 24 hr tablet Take 1,500 mg by mouth daily.   Yes [provider]  metoprolol succinate (TOPROL-XL) 50 MG 24 hr tablet Take 1 tablet (50 mg total) by mouth daily. Take with or immediately following a meal. 05/29/16  Yes Lord, Herminio Heads, NP  mirabegron ER (MYRBETRIQ) 25 MG TB24 tablet Take 25 mg by mouth daily.   Yes [provider]  naproxen sodium (ALEVE) 220 MG tablet Take 220 mg by mouth as needed (pain).   Yes [provider]  Paliperidone Palmitate (INVEGA TRINZA IM) Inject 1 Dose into the muscle every 30 (thirty) days.   Yes [provider]  clindamycin (CLEOCIN) 150 MG capsule Take 2 capsules (300 mg total) by mouth 3 (three) times daily for 7 days. 08/19/17 08/26/17  Alvira Monday, MD  divalproex (DEPAKOTE) 500 MG DR tablet Take 1 tablet (500 mg total) by mouth 2 (two) times daily. Patient not taking: Reported on 08/19/2017 05/29/16   Charm Rings, NP  ferrous sulfate 325 (65 FE) MG tablet Take 325 mg by mouth daily with breakfast.    [provider]  traZODone (DESYREL) 100 MG tablet Take 1 tablet (100 mg total) by mouth at bedtime. Patient not taking: Reported on 05/04/2016 04/17/14   Thermon Leyland, NP    Family History Family History  Adopted: Yes  Problem Relation Age of Onset  . Bipolar disorder Sister   . Alcohol abuse Brother   . Cancer Father   . Diabetes Mother     Social History Social History   Tobacco Use  . Smoking status: Former Smoker    Packs/day: 0.50    Years: 10.00    Pack years: 5.00    Types: Cigarettes  . Smokeless tobacco: Never Used  Substance Use Topics  . Alcohol use: No    Comment: occ  . Drug use: No     Allergies   Abilify [aripiprazole]   Review of Systems Review of Systems  Constitutional: Negative for fever.  HENT: Negative for sore throat.   Eyes: Negative for visual disturbance.  Respiratory: Negative for cough and shortness of breath.   Cardiovascular: Positive for chest pain (right breast pain).  Gastrointestinal: Positive for vomiting  (chronic daily no change). Negative for abdominal pain.  Genitourinary: Negative for difficulty urinating.  Musculoskeletal: Negative for back pain and neck pain.  Skin: Negative for rash.  Neurological: Negative for syncope and headaches.     Physical Exam Updated Vital Signs BP 124/89 (BP Location: Right Arm)   Pulse (!) 113   Temp 98.3 F (36.8 C) (Oral)   Resp 20   Ht 5\' 8"  (1.727 m)   Wt 127 kg (280 lb)   SpO2 97%  BMI 42.57 kg/m   Physical Exam  Constitutional: She is oriented to person, place, and time. She appears well-developed and well-nourished. No distress.  HENT:  Head: Normocephalic and atraumatic.  Eyes: Conjunctivae and EOM are normal.  Neck: Normal range of motion.  Cardiovascular: Regular rhythm, normal heart sounds and intact distal pulses. Tachycardia present. Exam reveals no gallop and no friction rub.  No murmur heard. Pulmonary/Chest: Effort normal and breath sounds normal. No respiratory distress. She has no wheezes. She has no rales. She exhibits tenderness (6x5cm area of fluctuance, erythema, tenderness).  Abdominal: Soft. She exhibits no distension. There is no tenderness. There is no guarding.  Musculoskeletal: She exhibits no edema or tenderness.  Neurological: She is alert and oriented to person, place, and time.  Skin: Skin is warm and dry. No rash noted. She is not diaphoretic. No erythema.  Nursing note and vitals reviewed.    ED Treatments / Results  Labs (all labs ordered are listed, but only abnormal results are displayed) Labs Reviewed  CBC WITH DIFFERENTIAL/PLATELET - Abnormal; Notable for the following components:      Result Value   WBC 15.9 (*)    Hemoglobin 11.4 (*)    HCT 34.1 (*)    Platelets 435 (*)    Neutro Abs 11.4 (*)    Monocytes Absolute 2.0 (*)    All other components within normal limits  COMPREHENSIVE METABOLIC PANEL - Abnormal; Notable for the following components:   CO2 19 (*)    Glucose, Bld 131 (*)    BUN  5 (*)    Albumin 3.2 (*)    All other components within normal limits  CULTURE, BLOOD (ROUTINE X 2)  CULTURE, BLOOD (ROUTINE X 2)  I-STAT CG4 LACTIC ACID, ED  I-STAT CG4 LACTIC ACID, ED  I-STAT BETA HCG BLOOD, ED ([facility], [facility], [facility] ONLY)    EKG None  Radiology No results found.  Procedures Procedures (including critical care time)  Medications Ordered in ED Medications  sodium chloride 0.9 % bolus 1,000 mL (0 mLs Intravenous Stopped 08/19/17 1106)  sodium chloride 0.9 % bolus 1,000 mL (0 mLs Intravenous Stopped 08/19/17 1406)  sodium chloride 0.9 % bolus 1,000 mL (0 mLs Intravenous Stopped 08/19/17 1359)  vancomycin (VANCOCIN) 2,500 mg in sodium chloride 0.9 % 500 mL IVPB (0 mg Intravenous Stopped 08/19/17 1400)     Initial Impression / Assessment and Plan / ED Course  I have reviewed the triage vital signs and the nursing notes.  Pertinent labs & imaging results that were available during my care of the patient were reviewed by me and considered in my medical decision making (see chart for details).     33 year old female with a history of paranoid schizophrenia, diet-controlled diabetes, presents with concern for breast pain.  Patient with large approximately 5 x 6 cm abscess or area of fluctuance by exam.  She is tachycardic into the 140s on arrival to the emergency department with slight improvement to the 120s.  Her white blood cells are elevated at 15.9.  Lactic acid and temperature within normal limits.  No sign of renal dysfunction.  Consulted general surgery, Dr. Sheliah Hatch given patient's tachycardia, for poor follow-up, and size of breast abscess, and he came to bedside and evaluated the patient.  He had offered her needle drainage of the breast abscess, however patient declined treatment.  Discussed with patient in detail risks of not draining an abscess, including worsening infection, which could lead to sepsis, other organ dysfunction or  death, however patient still wishes to  proceed with antibiotics instead of drainage.  In addition, given patient's tachycardia, and leukocytosis, discussed with her concern for development of sepsis in setting of infection and recommended inpatient antibiotics.  She is given a dose of vancomycin while in the emergency department, however then reported that she wanted to leave AGAINST MEDICAL ADVICE.  Patient is alert, oriented, coherent, and able to state the risks I have discussed with her.  She is noted to have a legal guardian, and is a ward of the state.  Both myself and social work attempted to contact the legal guardian regarding patient's care, and we are unable to get in touch with them.  The patient is adamant about leaving the emergency department, reporting that she brought herself to the emergency department, and so she could also sign herself out.  She does appear to understand the risks that I have discussed with her.  She does not have any signs of acute mental illness, suicidal ideation, homicidal ideation, or active psychosis, and I do not feel that IVC is appropriate at this time.  Given her inability to get in contact with her guardian, and inability to keep her in the emergency department without involuntary commitment, we have discussed risks with patient in detail and she left the emergency department AGAINST MEDICAL ADVICE.  Gave patient follow-up information for the breast center, and encouraged her to come to the emergency department if her symptoms return.  Gave her a prescription for clindamycin to take, recommend warm compresses, and again discussed she is welcome to return for further care in addition to other reasons to return.   Final Clinical Impressions(s) / ED Diagnoses   Final diagnoses:  Breast abscess  Cellulitis of breast    ED Discharge Orders        Ordered    clindamycin (CLEOCIN) 150 MG capsule  3 times daily     08/19/17 1355       Alvira Monday, MD 08/19/17 1817

## 2017-08-19 NOTE — Discharge Planning (Signed)
[facility].  Pt has insurance with assisgned PCP [facility]).

## 2017-08-23 ENCOUNTER — Telehealth: Payer: Self-pay | Admitting: *Deleted

## 2017-08-23 NOTE — Telephone Encounter (Signed)
Pt legal guardian [facility](Molly [name]) called regarding recent visit.  EDCM updated Molly [name] and faxed information for pt file.  No further EDCM needs identified at this time.

## 2017-08-24 LAB — CULTURE, BLOOD (ROUTINE X 2)
CULTURE: NO GROWTH
Culture: NO GROWTH
SPECIAL REQUESTS: ADEQUATE
Special Requests: ADEQUATE

## 2017-10-03 NOTE — H&P (Signed)
HISTORY AND PHYSICAL  Molly [name] is a 32 y.o. female patient with CC: painful teeth  No diagnosis found.  Past Medical History:  Diagnosis Date  . Abnormal Pap smear   . ASC-cannot exclude HGSIL on Pap 02/15/2012   ASC-US on 02/03/2012 pap (associated Trichomonas infection). No reflex HPV testing performed on specimen.  Patient informed that she will need repeat Pap in one year.      . ATTENTION DEFICIT, W/O HYPERACTIVITY, History of 06/30/2006   Qualifier: History of  By: McDiarmid MD, Tawanna Cooler    . CERVICITIS, GONOCOCCAL, History of 01/09/2007   Qualifier: History of  By: McDiarmid MD, Tawanna Cooler    . 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 Depression   . Diabetes mellitus    diet controlled  . Eczema   . Schizophrenia (HCC)   . 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 Dennie Bible (Psych) At [facility] in  [place], [place]. Qualifier: Hospitalized for  By: McDiarmid MD, Tawanna Cooler    . 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 TOBACCO USER 02/08/2009   Qualifier: Diagnosis of  By: Knox Royalty      No current facility-administered medications for this encounter.    Current Outpatient Medications  Medication Sig Dispense Refill  . acetaminophen (TYLENOL) 500 MG tablet Take 500 mg by mouth every 6 (six) hours as needed for headache (headache).    Marland Kitchen alum & mag hydroxide-simeth (MAALOX/MYLANTA) 200-200-20 MG/5ML suspension Take 30 mLs by mouth every 6 (six) hours as needed for indigestion or heartburn.    . benztropine (COGENTIN) 0.5 MG tablet Take 1 tablet (0.5 mg total) by mouth 2 (two) times daily. 60 tablet 0  . cholecalciferol (VITAMIN D) 1000 units tablet Take 2,000 Units by mouth daily.    . cloZAPine (CLOZARIL) 100 MG tablet Take 3 tablets (300 mg total) by mouth 2 (two) times daily. 60 tablet 0  . divalproex (DEPAKOTE ER) 500 MG 24  hr tablet Take 1,500 mg by mouth daily.    . divalproex (DEPAKOTE) 500 MG DR tablet Take 1 tablet (500 mg total) by mouth 2 (two) times daily. (Patient not taking: Reported on 08/19/2017) 60 tablet 0  . ferrous sulfate 325 (65 FE) MG tablet Take 325 mg by mouth daily with breakfast.    . metoprolol succinate (TOPROL-XL) 50 MG 24 hr tablet Take 1 tablet (50 mg total) by mouth daily. Take with or immediately following a meal. 30 tablet 0  . mirabegron ER (MYRBETRIQ) 25 MG TB24 tablet Take 25 mg by mouth daily.    . naproxen sodium (ALEVE) 220 MG tablet Take 220 mg by mouth as needed (pain).    . Paliperidone Palmitate (INVEGA TRINZA IM) Inject 1 Dose into the muscle every 30 (thirty) days.    . traZODone (DESYREL) 100 MG tablet Take 1 tablet (100 mg total) by mouth at bedtime. (Patient not taking: Reported on 05/04/2016) 30 tablet 0   Allergies  Allergen Reactions  . Abilify [Aripiprazole] Other (See Comments)    Thinks it's nasty- does not want it.  Injection is ok.     Active Problems:   * No active hospital problems. *  Vitals: unknown if currently breastfeeding. Lab results:No results found for this or any previous visit (from the past 24 hour(s)). Radiology Results: No results found. General  appearance: alert, cooperative and moderately obese Head: Normocephalic, without obvious abnormality, atraumatic Eyes: negative Nose: Nares normal. Septum midline. Mucosa normal. No drainage or sinus tenderness. Throat: large carious lesions teeth  1, 17, 19, 32. No edema, purulence. Pharynx clear. Neck: no adenopathy, supple, symmetrical, trachea midline and thyroid not enlarged, symmetric, no tenderness/mass/nodules Resp: clear to auscultation bilaterally Cardio: regular rate and rhythm, S1, S2 normal, no murmur, click, rub or gallop  Assessment:Nonrestorable teeth # 1, 17, 19, 32  Plan: Dental extraction teeth # 1, 17, 19, 32. GA. Day sugery.   Ocie DoyneScott Evone Arseneau 10/03/2017

## 2017-10-06 ENCOUNTER — Encounter (HOSPITAL_COMMUNITY): Payer: Self-pay | Admitting: *Deleted

## 2017-10-06 MED ORDER — DEXTROSE 5 % IV SOLN
3.0000 g | INTRAVENOUS | Status: AC
  Administered 2017-10-07: 3 g via INTRAVENOUS
  Filled 2017-10-06: qty 3

## 2017-10-06 NOTE — Progress Notes (Signed)
Unable to reach pt by her phone #. Called the number that was listed as "best" number and it was to the [facility]. I spoke with Eunice BlaseDebbie, one of the social workers there. They help coordinate her appts and will be bringing her to the hospital in the AM. She was able to give me what was listed in their notes about pt's medical history. Pt is a diabetic, diet controlled. They do not know about her A1C or if she checks her blood sugar at home. Pt has a legal guardian, [name] and Eunice BlaseDebbie states she will be calling her and making sure that she will be here in the AM. She states she will call me back when she finds out. As far as Eunice BlaseDebbie knows pt does not have a responsible adult to be with her the 24 hours after surgery, she states she will get in touch with pt and see if she has made arrangements, she states she will let me know.

## 2017-10-06 NOTE — Progress Notes (Signed)
Called [facility] on [address] to get pt's most recent A1C. It was 6.9 on 06/29/17

## 2017-10-06 NOTE — Anesthesia Preprocedure Evaluation (Addendum)
Anesthesia Evaluation  Patient identified by MRN, date of birth, ID band Patient awake    Reviewed: Allergy & Precautions, H&P , NPO status , Patient's Chart, lab work & pertinent test results, reviewed documented beta blocker date and time   Airway Mallampati: II  TM Distance: >3 FB Neck ROM: full    Dental no notable dental hx.    Pulmonary asthma , former smoker,    Pulmonary exam normal breath sounds clear to auscultation       Cardiovascular Exercise Tolerance: Good hypertension, Pt. on medications and Pt. 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)   Neuromuscular disease    GI/Hepatic negative GI ROS, Neg liver ROS,   Endo/Other  negative endocrine ROSdiabetes  Renal/GU negative Renal ROS  negative genitourinary   Musculoskeletal   Abdominal   Peds  Hematology negative hematology ROS (+)   Anesthesia Other Findings   Reproductive/Obstetrics negative OB ROS                            Anesthesia Physical Anesthesia Plan  ASA: III  Anesthesia Plan: General   Post-op Pain Management:    Induction: Intravenous  PONV Risk Score and Plan: 3 and Ondansetron, Treatment may vary due to age or medical condition and Midazolam  Airway Management Planned: Oral ETT  Additional Equipment:   Intra-op Plan:   Post-operative Plan: Extubation in OR  Informed Consent: I have reviewed the patients History and Physical, chart, labs and discussed the procedure including the risks, benefits and alternatives for the proposed anesthesia with the patient or authorized representative who has indicated his/her understanding and acceptance.     Plan  Discussed with: CRNA, Anesthesiologist and Surgeon  Anesthesia Plan Comments: (  )        Anesthesia Quick Evaluation

## 2017-10-07 ENCOUNTER — Other Ambulatory Visit: Payer: Self-pay

## 2017-10-07 ENCOUNTER — Encounter (HOSPITAL_COMMUNITY)
Admission: RE | Disposition: A | Discharge status: Home / Self Care | Payer: Self-pay | Source: Ambulatory Visit | Attending: Oral Surgery

## 2017-10-07 ENCOUNTER — Observation Stay (HOSPITAL_COMMUNITY)
Admission: RE | Admit: 2017-10-07 | Discharge: 2017-10-08 | Disposition: A | Discharge status: Home / Self Care | Payer: Medicare Other | Source: Ambulatory Visit | Attending: Oral Surgery | Admitting: Oral Surgery

## 2017-10-07 ENCOUNTER — Ambulatory Visit (HOSPITAL_COMMUNITY): Payer: Medicare Other | Admitting: Certified Registered Nurse Anesthetist

## 2017-10-07 ENCOUNTER — Encounter (HOSPITAL_COMMUNITY): Payer: Self-pay | Admitting: *Deleted

## 2017-10-07 DIAGNOSIS — Z9889 Other specified postprocedural states: Secondary | ICD-10-CM

## 2017-10-07 DIAGNOSIS — Z79899 Other long term (current) drug therapy: Secondary | ICD-10-CM | POA: Diagnosis not present

## 2017-10-07 DIAGNOSIS — E119 Type 2 diabetes mellitus without complications: Secondary | ICD-10-CM | POA: Insufficient documentation

## 2017-10-07 DIAGNOSIS — F200 Paranoid schizophrenia: Secondary | ICD-10-CM | POA: Insufficient documentation

## 2017-10-07 DIAGNOSIS — I10 Essential (primary) hypertension: Secondary | ICD-10-CM | POA: Diagnosis not present

## 2017-10-07 DIAGNOSIS — Z87891 Personal history of nicotine dependence: Secondary | ICD-10-CM | POA: Diagnosis not present

## 2017-10-07 DIAGNOSIS — Z6841 Body Mass Index (BMI) 40.0 and over, adult: Secondary | ICD-10-CM | POA: Insufficient documentation

## 2017-10-07 DIAGNOSIS — K029 Dental caries, unspecified: Secondary | ICD-10-CM | POA: Diagnosis present

## 2017-10-07 HISTORY — DX: Essential (primary) hypertension: I10

## 2017-10-07 HISTORY — DX: Overactive bladder: N32.81

## 2017-10-07 HISTORY — DX: Unspecified asthma, uncomplicated: J45.909

## 2017-10-07 HISTORY — PX: TOOTH EXTRACTION: SHX859

## 2017-10-07 LAB — CBC
HEMATOCRIT: 40.2 % (ref 36.0–46.0)
Hemoglobin: 12.7 g/dL (ref 12.0–15.0)
MCH: 27.3 pg (ref 26.0–34.0)
MCHC: 31.6 g/dL (ref 30.0–36.0)
MCV: 86.3 fL (ref 78.0–100.0)
Platelets: 430 10*3/uL — ABNORMAL HIGH (ref 150–400)
RBC: 4.66 MIL/uL (ref 3.87–5.11)
RDW: 15.3 % (ref 11.5–15.5)
WBC: 13 10*3/uL — AB (ref 4.0–10.5)

## 2017-10-07 LAB — BASIC METABOLIC PANEL
Anion gap: 11 (ref 5–15)
BUN: 7 mg/dL (ref 6–20)
CHLORIDE: 110 mmol/L (ref 101–111)
CO2: 17 mmol/L — ABNORMAL LOW (ref 22–32)
Calcium: 8.9 mg/dL (ref 8.9–10.3)
Creatinine, Ser: 0.86 mg/dL (ref 0.44–1.00)
GFR calc non Af Amer: >60 mL/min (ref >60)
Glucose, Bld: 117 mg/dL — ABNORMAL HIGH (ref 65–99)
POTASSIUM: 3.8 mmol/L (ref 3.5–5.1)
SODIUM: 138 mmol/L (ref 135–145)

## 2017-10-07 LAB — GLUCOSE, CAPILLARY
GLUCOSE-CAPILLARY: 135 mg/dL — AB (ref 65–99)
GLUCOSE-CAPILLARY: 138 mg/dL — AB (ref 65–99)

## 2017-10-07 LAB — HEMOGLOBIN A1C
Hgb A1c MFr Bld: 6.7 % — ABNORMAL HIGH (ref 4.8–5.6)
Mean Plasma Glucose: 145.59 mg/dL

## 2017-10-07 LAB — HCG, SERUM, QUALITATIVE: PREG SERUM: NEGATIVE

## 2017-10-07 SURGERY — EXTRACTION, TOOTH, MOLAR
Anesthesia: General | Site: Mouth

## 2017-10-07 MED ORDER — HYDROMORPHONE HCL 2 MG/ML IJ SOLN: 1.0000 mg | INTRAMUSCULAR | Status: DC | PRN

## 2017-10-07 MED ORDER — DIPHENHYDRAMINE HCL 25 MG PO CAPS
25.0000 mg | ORAL_CAPSULE | Freq: Four times a day (QID) | ORAL | Status: DC | PRN
  Filled 2017-10-07: qty 1

## 2017-10-07 MED ORDER — PROPOFOL 10 MG/ML IV BOLUS
INTRAVENOUS | Status: AC
  Filled 2017-10-07: qty 20

## 2017-10-07 MED ORDER — DEXMEDETOMIDINE HCL 200 MCG/2ML IV SOLN
INTRAVENOUS | Status: DC | PRN
  Administered 2017-10-07 (×5): 4 ug via INTRAVENOUS

## 2017-10-07 MED ORDER — DIVALPROEX SODIUM ER 500 MG PO TB24
1500.0000 mg | ORAL_TABLET | Freq: Every day | ORAL | Status: DC
  Administered 2017-10-07: 1500 mg via ORAL
  Filled 2017-10-07 (×2): qty 3

## 2017-10-07 MED ORDER — SUCCINYLCHOLINE CHLORIDE 20 MG/ML IJ SOLN
INTRAMUSCULAR | Status: DC | PRN
  Administered 2017-10-07: 100 mg via INTRAVENOUS

## 2017-10-07 MED ORDER — OXYCODONE HCL 5 MG PO TABS: 5.0000 mg | ORAL_TABLET | ORAL | Status: DC | PRN

## 2017-10-07 MED ORDER — LIDOCAINE 2% (20 MG/ML) 5 ML SYRINGE
INTRAMUSCULAR | Status: AC
  Filled 2017-10-07: qty 5

## 2017-10-07 MED ORDER — ONDANSETRON HCL 4 MG/2ML IJ SOLN: 4.0000 mg | Freq: Once | INTRAMUSCULAR | Status: DC | PRN

## 2017-10-07 MED ORDER — AMOXICILLIN 500 MG PO CAPS: 500.0000 mg | ORAL_CAPSULE | Freq: Three times a day (TID) | ORAL | 0 refills | Status: DC

## 2017-10-07 MED ORDER — METOPROLOL TARTRATE 5 MG/5ML IV SOLN: 5.0000 mg | Freq: Four times a day (QID) | INTRAVENOUS | Status: DC | PRN

## 2017-10-07 MED ORDER — HYDROMORPHONE HCL 1 MG/ML IJ SOLN: 1.0000 mg | INTRAMUSCULAR | Status: DC | PRN

## 2017-10-07 MED ORDER — ONDANSETRON HCL 4 MG/2ML IJ SOLN
4.0000 mg | Freq: Four times a day (QID) | INTRAMUSCULAR | Status: DC | PRN
  Administered 2017-10-07: 4 mg via INTRAVENOUS
  Filled 2017-10-07: qty 2

## 2017-10-07 MED ORDER — ONDANSETRON HCL 4 MG/2ML IJ SOLN
INTRAMUSCULAR | Status: DC | PRN
  Administered 2017-10-07: 4 mg via INTRAVENOUS

## 2017-10-07 MED ORDER — ACETAMINOPHEN 160 MG/5ML PO SOLN: 325.0000 mg | ORAL | Status: DC | PRN

## 2017-10-07 MED ORDER — ACETAMINOPHEN 500 MG PO TABS: 1000.0000 mg | ORAL_TABLET | Freq: Four times a day (QID) | ORAL | Status: DC

## 2017-10-07 MED ORDER — ACETAMINOPHEN 325 MG PO TABS: 325.0000 mg | ORAL_TABLET | ORAL | Status: DC | PRN

## 2017-10-07 MED ORDER — DIPHENHYDRAMINE HCL 50 MG/ML IJ SOLN: 25.0000 mg | Freq: Four times a day (QID) | INTRAMUSCULAR | Status: DC | PRN

## 2017-10-07 MED ORDER — MIDAZOLAM HCL 2 MG/2ML IJ SOLN
INTRAMUSCULAR | Status: AC
  Filled 2017-10-07: qty 2

## 2017-10-07 MED ORDER — METOPROLOL TARTRATE 5 MG/5ML IV SOLN
5.0000 mg | Freq: Four times a day (QID) | INTRAVENOUS | Status: DC | PRN
  Filled 2017-10-07: qty 5

## 2017-10-07 MED ORDER — GLYCOPYRROLATE 0.2 MG/ML IJ SOLN
INTRAMUSCULAR | Status: DC | PRN
  Administered 2017-10-07: 0.2 mg via INTRAVENOUS

## 2017-10-07 MED ORDER — OXYMETAZOLINE HCL 0.05 % NA SOLN
NASAL | Status: AC
  Filled 2017-10-07: qty 30

## 2017-10-07 MED ORDER — DEXTROSE-NACL 5-0.45 % IV SOLN
INTRAVENOUS | Status: DC
  Administered 2017-10-07: 14:00:00 via INTRAVENOUS

## 2017-10-07 MED ORDER — DEXMEDETOMIDINE HCL IN NACL 200 MCG/50ML IV SOLN
INTRAVENOUS | Status: AC
  Filled 2017-10-07: qty 50

## 2017-10-07 MED ORDER — FENTANYL CITRATE (PF) 250 MCG/5ML IJ SOLN
INTRAMUSCULAR | Status: AC
  Filled 2017-10-07: qty 5

## 2017-10-07 MED ORDER — PHENOL 1.4 % MT LIQD
1.0000 | OROMUCOSAL | Status: DC | PRN
  Filled 2017-10-07: qty 177

## 2017-10-07 MED ORDER — MIDAZOLAM HCL 2 MG/2ML IJ SOLN
INTRAMUSCULAR | Status: DC | PRN
  Administered 2017-10-07 (×2): 1 mg via INTRAVENOUS

## 2017-10-07 MED ORDER — ACETAMINOPHEN 500 MG PO TABS
1000.0000 mg | ORAL_TABLET | Freq: Four times a day (QID) | ORAL | Status: DC
  Administered 2017-10-07 (×2): 1000 mg via ORAL
  Filled 2017-10-07 (×3): qty 2

## 2017-10-07 MED ORDER — BENZTROPINE MESYLATE 0.5 MG PO TABS: 0.5000 mg | ORAL_TABLET | Freq: Two times a day (BID) | ORAL | Status: DC

## 2017-10-07 MED ORDER — LIDOCAINE-EPINEPHRINE 2 %-1:100000 IJ SOLN
INTRAMUSCULAR | Status: AC
  Filled 2017-10-07: qty 1

## 2017-10-07 MED ORDER — ONDANSETRON 4 MG PO TBDP: 4.0000 mg | ORAL_TABLET | Freq: Four times a day (QID) | ORAL | Status: DC | PRN

## 2017-10-07 MED ORDER — METOPROLOL SUCCINATE ER 25 MG PO TB24
50.0000 mg | ORAL_TABLET | Freq: Once | ORAL | Status: DC
  Filled 2017-10-07 (×3): qty 2

## 2017-10-07 MED ORDER — DEXAMETHASONE SODIUM PHOSPHATE 10 MG/ML IJ SOLN
INTRAMUSCULAR | Status: AC
  Filled 2017-10-07: qty 1

## 2017-10-07 MED ORDER — 0.9 % SODIUM CHLORIDE (POUR BTL) OPTIME
TOPICAL | Status: DC | PRN
  Administered 2017-10-07: 1000 mL

## 2017-10-07 MED ORDER — HYDROCODONE-ACETAMINOPHEN 5-325 MG PO TABS: 1.0000 | ORAL_TABLET | Freq: Four times a day (QID) | ORAL | 0 refills | Status: DC | PRN

## 2017-10-07 MED ORDER — ALBUTEROL SULFATE (2.5 MG/3ML) 0.083% IN NEBU: 2.5000 mg | INHALATION_SOLUTION | Freq: Four times a day (QID) | RESPIRATORY_TRACT | Status: DC | PRN

## 2017-10-07 MED ORDER — FENTANYL CITRATE (PF) 100 MCG/2ML IJ SOLN: 25.0000 ug | INTRAMUSCULAR | Status: DC | PRN

## 2017-10-07 MED ORDER — ALBUTEROL SULFATE HFA 108 (90 BASE) MCG/ACT IN AERS
1.0000 | INHALATION_SPRAY | Freq: Four times a day (QID) | RESPIRATORY_TRACT | Status: DC | PRN
Start: 2017-10-07 — End: 2017-10-07

## 2017-10-07 MED ORDER — LIDOCAINE-EPINEPHRINE 2 %-1:100000 IJ SOLN
INTRAMUSCULAR | Status: DC | PRN
  Administered 2017-10-07: 20 mL via INTRADERMAL

## 2017-10-07 MED ORDER — OXYMETAZOLINE HCL 0.05 % NA SOLN
NASAL | Status: DC | PRN
  Administered 2017-10-07: 3 via NASAL

## 2017-10-07 MED ORDER — METOPROLOL SUCCINATE ER 50 MG PO TB24: 50.0000 mg | ORAL_TABLET | Freq: Every day | ORAL | Status: DC

## 2017-10-07 MED ORDER — MEPERIDINE HCL 50 MG/ML IJ SOLN: 6.2500 mg | INTRAMUSCULAR | Status: DC | PRN

## 2017-10-07 MED ORDER — LACTATED RINGERS IV SOLN
INTRAVENOUS | Status: DC
Start: 2017-10-07 — End: 2017-10-07
  Administered 2017-10-07 (×2): via INTRAVENOUS

## 2017-10-07 MED ORDER — NICOTINE 7 MG/24HR TD PT24
7.0000 mg | MEDICATED_PATCH | Freq: Every day | TRANSDERMAL | Status: DC
  Administered 2017-10-07 – 2017-10-08 (×2): 7 mg via TRANSDERMAL
  Filled 2017-10-07 (×2): qty 1

## 2017-10-07 MED ORDER — METOPROLOL TARTRATE 5 MG/5ML IV SOLN
INTRAVENOUS | Status: DC | PRN
  Administered 2017-10-07 (×2): 2 mg via INTRAVENOUS
  Administered 2017-10-07 (×2): 2.5 mg via INTRAVENOUS
  Administered 2017-10-07 (×2): 1 mg via INTRAVENOUS
  Administered 2017-10-07 (×2): 2 mg via INTRAVENOUS

## 2017-10-07 MED ORDER — METOPROLOL SUCCINATE ER 50 MG PO TB24
50.0000 mg | ORAL_TABLET | Freq: Every day | ORAL | Status: DC
Start: 2017-10-07 — End: 2017-10-08
  Administered 2017-10-07: 50 mg via ORAL
  Filled 2017-10-07: qty 1

## 2017-10-07 MED ORDER — PROPOFOL 10 MG/ML IV BOLUS
INTRAVENOUS | Status: DC | PRN
  Administered 2017-10-07: 200 mg via INTRAVENOUS

## 2017-10-07 MED ORDER — ONDANSETRON HCL 4 MG/2ML IJ SOLN
INTRAMUSCULAR | Status: AC
  Filled 2017-10-07: qty 2

## 2017-10-07 MED ORDER — OXYCODONE HCL 5 MG PO TABS: 5.0000 mg | ORAL_TABLET | Freq: Once | ORAL | Status: DC | PRN

## 2017-10-07 MED ORDER — SODIUM CHLORIDE 0.9 % IR SOLN
Status: DC | PRN
  Administered 2017-10-07: 1000 mL

## 2017-10-07 MED ORDER — CLOZAPINE 100 MG PO TABS
300.0000 mg | ORAL_TABLET | Freq: Every day | ORAL | Status: DC
  Administered 2017-10-07: 300 mg via ORAL
  Filled 2017-10-07 (×2): qty 3

## 2017-10-07 MED ORDER — TRAZODONE HCL 100 MG PO TABS
100.0000 mg | ORAL_TABLET | Freq: Every day | ORAL | Status: DC
Start: 2017-10-07 — End: 2017-10-07

## 2017-10-07 MED ORDER — OXYCODONE HCL 5 MG/5ML PO SOLN: 5.0000 mg | Freq: Once | ORAL | Status: DC | PRN

## 2017-10-07 MED ORDER — FENTANYL CITRATE (PF) 250 MCG/5ML IJ SOLN
INTRAMUSCULAR | Status: DC | PRN
  Administered 2017-10-07: 50 ug via INTRAVENOUS
  Administered 2017-10-07: 100 ug via INTRAVENOUS
  Administered 2017-10-07 (×2): 50 ug via INTRAVENOUS

## 2017-10-07 MED ORDER — LIDOCAINE HCL (CARDIAC) PF 100 MG/5ML IV SOSY
PREFILLED_SYRINGE | INTRAVENOUS | Status: DC | PRN
  Administered 2017-10-07: 60 mg via INTRATRACHEAL

## 2017-10-07 SURGICAL SUPPLY — 31 items
BLADE SURG 15 STRL LF DISP TIS (BLADE) ×1 IMPLANT
BLADE SURG 15 STRL SS (BLADE) ×2
BUR CROSS CUT (BURR)
BUR CROSS CUT FISSURE 1.6 (BURR) ×2 IMPLANT
BUR SRG MED 1.2XXCUT FSSR (BURR) IMPLANT
BURR SRG MED 1.2XXCUT FSSR (BURR)
CANISTER SUCT 3000ML PPV (MISCELLANEOUS) ×2 IMPLANT
COVER SURGICAL LIGHT HANDLE (MISCELLANEOUS) ×2 IMPLANT
DECANTER SPIKE VIAL GLASS SM (MISCELLANEOUS) IMPLANT
GAUZE PACKING FOLDED 2  STR (GAUZE/BANDAGES/DRESSINGS) ×1
GAUZE PACKING FOLDED 2 STR (GAUZE/BANDAGES/DRESSINGS) ×1 IMPLANT
GAUZE SPONGE 4X4 16PLY XRAY LF (GAUZE/BANDAGES/DRESSINGS) IMPLANT
GLOVE BIO SURGEON STRL SZ 6.5 (GLOVE) ×2 IMPLANT
GLOVE BIO SURGEON STRL SZ7.5 (GLOVE) ×2 IMPLANT
GLOVE BIOGEL PI IND STRL 7.0 (GLOVE) ×1 IMPLANT
GLOVE BIOGEL PI INDICATOR 7.0 (GLOVE) ×1
GOWN STRL REUS W/ TWL LRG LVL3 (GOWN DISPOSABLE) ×1 IMPLANT
GOWN STRL REUS W/ TWL XL LVL3 (GOWN DISPOSABLE) ×1 IMPLANT
GOWN STRL REUS W/TWL LRG LVL3 (GOWN DISPOSABLE) ×2
GOWN STRL REUS W/TWL XL LVL3 (GOWN DISPOSABLE) ×2
KIT BASIN OR (CUSTOM PROCEDURE TRAY) ×2 IMPLANT
KIT TURNOVER KIT B (KITS) ×2 IMPLANT
NEEDLE 22X1 1/2 (OR ONLY) (NEEDLE) ×2 IMPLANT
NS IRRIG 1000ML POUR BTL (IV SOLUTION) ×2 IMPLANT
PAD ARMBOARD 7.5X6 YLW CONV (MISCELLANEOUS) ×4 IMPLANT
SUT CHROMIC 3 0 PS 2 (SUTURE) ×4 IMPLANT
SYR CONTROL 10ML LL (SYRINGE) ×2 IMPLANT
TOWEL OR 17X26 10 PK STRL BLUE (TOWEL DISPOSABLE) ×2 IMPLANT
TRAY ENT MC OR (CUSTOM PROCEDURE TRAY) ×2 IMPLANT
TUBING IRRIGATION (MISCELLANEOUS) ×2 IMPLANT
YANKAUER SUCT BULB TIP NO VENT (SUCTIONS) ×2 IMPLANT

## 2017-10-07 NOTE — H&P (Signed)
H&P documentation  -History and Physical Reviewed  -Patient has been re-examined  -No change in the plan of care  Molly [name]  

## 2017-10-07 NOTE — Op Note (Signed)
NAMLevester [name]: Blades, Adrian S. MEDICAL RECORD [id number] ACCOUNT [id number] DATE OF BIRTH:Aug 03, 1985 FACILITY: [facility] LOCATION: [facility] PHYSICIAN:Dondrea Clendenin M. Yanissa Michalsky, DDS  OPERATIVE REPORT  DATE OF PROCEDURE:  10/07/2017  PREOPERATIVE DIAGNOSIS:  Dental caries, nonrestorable teeth 1, 17, 19, 32.  POSTOPERATIVE DIAGNOSIS:  Dental caries, nonrestorable teeth 1, 17, 19, 32.  PROCEDURE:  Extraction of teeth numbers 1, 17, 19, 32.  SURGEON:  Ocie DoyneScott Kathie Posa, DDS  ANESTHESIA:  General.  Dr. ____ attending nasal intubation.  INDICATIONS:  The patient is a 32 year old morbidly obese patient with a history of paranoid schizophrenia, catatonic schizophrenia, hypertension, diabetes, depression, ADD who has dental phobia.  She had 4 nonrestorable teeth, which were causing  discomfort.  I recommended removal of these teeth.  She was unable to tolerate the procedure in the office, so this [facility]  DESCRIPTION OF PROCEDURE:  The patient was taken to the operating room and placed on the table in the supine position.  General anesthesia was administered intravenously and a nasal endotracheal tube was placed and secured.  The eyes were protected and  the patient was draped for surgery.  A timeout was performed.  The posterior pharynx was suctioned and a throat pack was placed.  Lidocaine 2% 1:100,000 epinephrine was infiltrated in the inferior alveolar block on the right and left side and then buccal  and palatal infiltration in the right maxilla around tooth #1.  A bite block was placed on the right side of the mouth and a sweetheart retractor was used to retract the tongue.  A #15 blade was used to make an incision around teeth 17 and 19 in the  mandible.  The periosteum was reflected.  The teeth were elevated.  Both teeth fractured upon attempted removal with forceps necessitating removal of additional bone around the teeth and sectioning with a Stryker handpiece and a fissure bur  under  irrigation.  The teeth were removed with a 301 elevator and then the sockets were curetted, irrigated, and closed with 3-0 chromic.  Sweetheart retractor was repositioned in the other side of the mouth as well as the bite block.  Attention was turned to  teeth numbers 1 and 32.  The #15 blade was used to make an incision around these teeth.  The periosteum was reflected.  Bone was removed from around these teeth.  Tooth 1 was removed with the upper Universal forceps after elevating with a 301 elevator.   The lower tooth fractured and additional bone was removed and the tooth was sectioned with a Stryker handpiece under irrigation.  The tooth was then removed with forceps.  The sockets were then curetted, irrigated and closed with 3-0 chromic.  The oral  cavity was then irrigated and suctioned.  The throat pack was removed.  The patient was left in care of anesthesia for extubation and transport to recovery room with plans to stay for a 23-hour observation as she had no one to watch her for care at home.  ESTIMATED BLOOD LOSS:  Minimal.  COMPLICATIONS:  None.  SPECIMENS:  None.  TN/NUANCE  D:10/07/2017 T:10/07/2017 JOB:[id number]

## 2017-10-07 NOTE — Anesthesia Postprocedure Evaluation (Signed)
Anesthesia Post Note  Patient: Molly FreshJessica S [name]  Procedure(s) Performed: EXTRACTION TEETH NUMBERS ONE, SEVENTEEN, NINETEEN AND THIRTY TWO (N/A Mouth)     Patient location during evaluation: PACU Anesthesia Type: General Level of consciousness: awake and alert Pain management: pain level controlled Vital Signs Assessment: post-procedure vital signs reviewed and stable Respiratory status: spontaneous breathing, nonlabored ventilation, respiratory function stable and patient connected to nasal cannula oxygen Cardiovascular status: blood pressure returned to baseline and stable Postop Assessment: no apparent nausea or vomiting Anesthetic complications: no    Last Vitals:  Vitals:   10/07/17 1105 10/07/17 1130  BP:  (!) 152/118  Pulse:  91  Resp:  14  Temp: 36.5 C 36.7 C  SpO2:  100%    Last Pain:  Vitals:   10/07/17 1130  TempSrc: Oral  PainSc:                  Molly [name]

## 2017-10-07 NOTE — Social Work (Addendum)
CSW aware pt has guardian through [name] [employer], [employer] [name]. # is [phone] CSW has reached out to Ms. [name] to address any disposition needs.  12:00pm- CSW also called Molly [name] with [facility] (pt's ACT team). # is [phone] Pt's ACT team will support transportation with discharge over the weekend and remain available consistently to support pt's independence in the community.   Weekend transportation can be arranged through Rob with [facility] at [phone] when pt is discharged.   CSW will follow.  Doy HutchingIsabel H Connie [name], LCSWA [facility] Clinical Social Work [phone]

## 2017-10-07 NOTE — Op Note (Signed)
10/07/2017  9:57 AM  PATIENT:  Molly [name]  32 y.o. female  PRE-OPERATIVE DIAGNOSIS:  DENTAL CARIES, NON-RESTORABLE TEETH # 1, 17, 19, 32  POST-OPERATIVE DIAGNOSIS:  SAME  PROCEDURE:  Procedure(s): EXTRACTION TEETH NUMBERS ONE, SEVENTEEN, NINETEEN AND THIRTY TWO  SURGEON:  Surgeon(s): Ocie DoyneJensen, Karlon Schlafer, DDS  ANESTHESIA:   local and general  EBL:  minimal  DRAINS: none   SPECIMEN:  No Specimen  COUNTS:  YES  PLAN OF CARE: Discharge to home after PACU  PATIENT DISPOSITION:  PACU - hemodynamically stable.   PROCEDURE DETAILS: Dictation # [id number]  Molly [name], DMD 10/07/2017 9:57 AM

## 2017-10-07 NOTE — Anesthesia Procedure Notes (Signed)
Procedure Name: Intubation Date/Time: 10/07/2017 9:20 AM Performed by: Bethena Midgetddono, Ernest, MD Pre-anesthesia Checklist: Patient identified, Emergency Drugs available, Suction available and Patient being monitored Patient Re-evaluated:Patient Re-evaluated prior to induction Oxygen Delivery Method: Circle system utilized Preoxygenation: Pre-oxygenation with 100% oxygen Induction Type: IV induction Ventilation: Mask ventilation without difficulty and Nasal airway inserted- appropriate to patient size Laryngoscope Size: Glidescope (T3) Grade View: Grade II Nasal Tubes: Right and Magill forceps- large, utilized Tube size: 7.0 mm Number of attempts: 1 Airway Equipment and Method: Video-laryngoscopy Placement Confirmation: ETT inserted through vocal cords under direct vision,  positive ETCO2 and breath sounds checked- equal and bilateral Tube secured with: Tape Dental Injury: Bloody posterior oropharynx  Difficulty Due To: Difficulty was anticipated

## 2017-10-07 NOTE — Transfer of Care (Signed)
Immediate Anesthesia Transfer of Care Note  Patient: Molly [name]  Procedure(s) Performed: EXTRACTION TEETH NUMBERS ONE, SEVENTEEN, NINETEEN AND THIRTY TWO (N/A Mouth)  Patient Location: PACU  Anesthesia Type:General  Level of Consciousness: drowsy  Airway & Oxygen Therapy: Patient connected to face mask oxygen  Post-op Assessment: Report given to RN, Post -op Vital signs reviewed and stable and Patient moving all extremities X 4  Post vital signs: Reviewed and stable  Last Vitals:  Vitals Value Taken Time  BP    Temp 36.6 C 10/07/2017 10:08 AM  Pulse 113 10/07/2017 10:09 AM  Resp 12 10/07/2017 10:09 AM  SpO2 98 % 10/07/2017 10:09 AM  Vitals shown include unvalidated device data.  Last Pain:  Vitals:   10/07/17 1008  TempSrc:   PainSc: Asleep         Complications: No apparent anesthesia complications

## 2017-10-08 ENCOUNTER — Encounter (HOSPITAL_COMMUNITY): Payer: Self-pay | Admitting: Oral Surgery

## 2017-10-08 DIAGNOSIS — K029 Dental caries, unspecified: Secondary | ICD-10-CM | POA: Diagnosis not present

## 2017-10-09 NOTE — Discharge Summary (Signed)
Patient ID: Molly [name] MRN: [id number] DOB/AGE: July 11, 1985 32 y.o.  Admit date: 10/07/2017 Discharge date: 10/09/2017  Admission Diagnoses:  Discharge Diagnoses:  Active Problems:   Post-operative state   Discharged Condition: good  Hospital Course: Admitted for 23 h obs after surgery.   Consults: None  Significant Diagnostic Studies: pre-op labs   Treatments: oral surgery dental extractions. IV antibiotics and pain management  Discharge Exam: Blood pressure 125/83, pulse (!) 115, temperature 98.3 F (36.8 C), temperature source Oral, resp. rate 18, height 5\' 8"  (1.727 m), weight 290 lb (131.5 kg), SpO2 99 %, unknown if currently breastfeeding. General appearance: alert and cooperative Head: Normocephalic, without obvious abnormality, atraumatic Throat: hemostatic, minmal edema Neck: no adenopathy  Disposition: Home  Discharge Instructions    Call MD for:  difficulty breathing, headache or visual disturbances   Complete by:  As directed    Call MD for:  difficulty breathing, headache or visual disturbances   Complete by:  As directed    Call MD for:  persistant nausea and vomiting   Complete by:  As directed    Call MD for:  persistant nausea and vomiting   Complete by:  As directed    Call MD for:  severe uncontrolled pain   Complete by:  As directed    Call MD for:  severe uncontrolled pain   Complete by:  As directed    Call MD for:  temperature >100.4   Complete by:  As directed    Call MD for:  temperature >100.4   Complete by:  As directed    Diet - low sodium heart healthy   Complete by:  As directed    Soft Diet. Advance as tolerated.   Diet - low sodium heart healthy   Complete by:  As directed    Soft Diet. Advance as tolerated.   Discharge instructions   Complete by:  As directed    Ice to affected area for 2-3 days. Warm salt water mouth rinses 4-5 times per day starting the day after surgery. Soft diet, advance as tolerated. No smoking for 2  weeks. Follow-up visit with Dr. Barbette Merino as scheduled. Call [phone] for problems.   Discharge instructions   Complete by:  As directed    Ice to affected area for 2-3 days. Warm salt water mouth rinses 4-5 times per day starting the day after surgery. Soft diet, advance as tolerated. No smoking for 2 weeks. Follow-up visit with Dr. Barbette Merino as scheduled. Call [phone] for problems.   Increase activity slowly   Complete by:  As directed    Increase activity slowly   Complete by:  As directed      Allergies as of 10/08/2017      Reactions   Abilify [aripiprazole] Other (See Comments)   Thinks it's nasty- does not want it.  Injection is ok.        Medication List    STOP taking these medications   benztropine 0.5 MG tablet Commonly known as:  COGENTIN   traZODone 100 MG tablet Commonly known as:  DESYREL     TAKE these medications   albuterol 108 (90 Base) MCG/ACT inhaler Commonly known as:  PROVENTIL HFA;VENTOLIN HFA Inhale 1 puff into the lungs every 6 (six) hours as needed for wheezing or shortness of breath.   amoxicillin 500 MG capsule Commonly known as:  AMOXIL Take 1 capsule (500 mg total) by mouth 3 (three) times daily.   chlorhexidine 4 % external liquid Commonly  known as:  HIBICLENS Apply 1 application topically 2 (two) times daily as needed (per [facility]).   clindamycin 1 % external solution Commonly known as:  CLEOCIN T Apply 1 application topically 2 (two) times daily.   clotrimazole-betamethasone cream Commonly known as:  LOTRISONE Apply 1 application topically 2 (two) times daily.   cloZAPine 100 MG tablet Commonly known as:  CLOZARIL Take 3 tablets (300 mg total) by mouth 2 (two) times daily. What changed:  when to take this   divalproex 500 MG 24 hr tablet Commonly known as:  DEPAKOTE ER Take 1,500 mg by mouth daily.   doxycycline 100 MG capsule Commonly known as:  VIBRAMYCIN Take 100 mg by mouth 2 (two) times daily.   HYDROcodone-acetaminophen  5-325 MG tablet Commonly known as:  NORCO Take 1 tablet by mouth every 6 (six) hours as needed for moderate pain.   INVEGA SUSTENNA 234 MG/1.5ML Susy injection Generic drug:  paliperidone Inject 234 mg into the muscle every 30 (thirty) days.   medroxyPROGESTERone 150 MG/ML injection Commonly known as:  DEPO-PROVERA Inject 150 mg into the muscle every 3 (three) months.   metoprolol succinate 50 MG 24 hr tablet Commonly known as:  TOPROL-XL Take 1 tablet (50 mg total) by mouth daily. Take with or immediately following a meal.   MYRBETRIQ 25 MG Tb24 tablet Generic drug:  mirabegron ER Take 25 mg by mouth daily.   nicotine 7 mg/24hr patch Commonly known as:  NICODERM CQ - dosed in mg/24 hr Place 7 mg onto the skin daily.   Vitamin D 2000 units Caps Take 2,000 Units by mouth daily.        Signed: Ocie Doyne 10/09/2017, 10:33 AM

## 2017-12-14 ENCOUNTER — Encounter (HOSPITAL_COMMUNITY): Payer: Self-pay

## 2017-12-14 ENCOUNTER — Ambulatory Visit (HOSPITAL_COMMUNITY)
Admission: EM | Admit: 2017-12-14 | Discharge: 2017-12-14 | Disposition: A | Discharge status: Home / Self Care | Payer: Medicare Other | Attending: Family Medicine | Admitting: Family Medicine

## 2017-12-14 DIAGNOSIS — L0231 Cutaneous abscess of buttock: Secondary | ICD-10-CM

## 2017-12-14 MED ORDER — MORPHINE SULFATE (PF) 4 MG/ML IV SOLN
INTRAVENOUS | Status: AC
  Filled 2017-12-14: qty 1

## 2017-12-14 MED ORDER — HYDROCODONE-ACETAMINOPHEN 5-325 MG PO TABS: 1.0000 | ORAL_TABLET | ORAL | 0 refills | Status: DC | PRN

## 2017-12-14 MED ORDER — MORPHINE SULFATE (PF) 2 MG/ML IV SOLN: 4.0000 mg | Freq: Once | INTRAVENOUS | Status: DC

## 2017-12-14 MED ORDER — SULFAMETHOXAZOLE-TRIMETHOPRIM 800-160 MG PO TABS: 1.0000 | ORAL_TABLET | Freq: Two times a day (BID) | ORAL | 0 refills | Status: AC

## 2017-12-14 MED ORDER — MORPHINE SULFATE (PF) 4 MG/ML IV SOLN
4.0000 mg | Freq: Once | INTRAVENOUS | Status: AC
  Administered 2017-12-14: 4 mg via INTRAMUSCULAR

## 2017-12-14 MED ORDER — SULFAMETHOXAZOLE-TRIMETHOPRIM 800-160 MG PO TABS: 1.0000 | ORAL_TABLET | Freq: Two times a day (BID) | ORAL | 0 refills | Status: DC

## 2017-12-14 NOTE — ED Provider Notes (Signed)
[name]   [id number] 12/14/17 Arrival Time: 1434   CC: Abscess  SUBJECTIVE:  Molly [name] is a 32 y.o. female who presents with a possible abscess of her tailbone. Onset abrupt, approximately 2 days ago. States she was popping it and some pus came out.  Painful with sitting.  Reports previous symptoms and has had it drained in the past.  Complains of nausea, vomiting, and profuse purulent drainage from site.  Denies fever, chills, redness or swelling  Molly [name]. Phone number [phone]  ROS: As per HPI.  Past Medical History:  Diagnosis Date  . Abnormal Pap smear   . ASC-cannot exclude HGSIL on Pap 02/15/2012   ASC-US on 02/03/2012 pap (associated Trichomonas infection). No reflex HPV testing performed on specimen.  Patient informed that she will need repeat Pap in one year.      . Asthma   . ATTENTION DEFICIT, W/O HYPERACTIVITY, History of 06/30/2006   Qualifier: History of  By: Molly [name]    . CERVICITIS, GONOCOCCAL, History of 01/09/2007   Qualifier: History of  By: Molly [name]    . 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 Depression   . Diabetes mellitus    diet controlled  . Eczema   . Hypertension   . Overactive bladder   . Schizophrenia (HCC)   . 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 Qualifier: Hospitalized for  By: Molly [name]    . SCHIZOPHRENIA, PARANOID, CHRONIC 11/19/2008   Qualifier: Diagnosis of  By: Molly [name]    . TOBACCO USER 02/08/2009   Qualifier: Diagnosis of  By: Molly [name]     Past Surgical History:  Procedure Laterality Date  . INCISION AND DRAINAGE     pilanodal cyst  . TOOTH EXTRACTION N/A 10/07/2017   Procedure: EXTRACTION TEETH NUMBERS ONE, SEVENTEEN, NINETEEN AND THIRTY TWO;  [name]: Molly [name],  DDS;  Location: [facility] OR;  Service: Oral Surgery;  Laterality: N/A;   Allergies  Allergen Reactions  . Abilify [Aripiprazole] Other (See Comments)    Thinks it's nasty- does not want it.  Injection is ok.     No current facility-administered medications on file prior to encounter.    Current Outpatient Medications on File Prior to Encounter  Medication Sig Dispense Refill  . albuterol (PROVENTIL HFA;VENTOLIN HFA) 108 (90 Base) MCG/ACT inhaler Inhale 1 puff into the lungs every 6 (six) hours as needed for wheezing or shortness of breath.    Marland Kitchen amoxicillin (AMOXIL) 500 MG capsule Take 1 capsule (500 mg total) by mouth 3 (three) times daily. 21 capsule 0  . chlorhexidine (HIBICLENS) 4 % external liquid Apply 1 application topically 2 (two) times daily as needed (per Molly [name]).    . Cholecalciferol (VITAMIN D) 2000 units CAPS Take 2,000 Units by mouth daily.     . clindamycin (CLEOCIN T) 1 % external solution Apply 1 application topically 2 (two) times daily.    . clotrimazole-betamethasone (LOTRISONE) cream Apply 1 application topically 2 (two) times daily.    . cloZAPine (CLOZARIL) 100 MG tablet Take 3 tablets (300 mg total) by mouth 2 (two) times daily. (Patient taking differently: Take 300 mg by mouth at bedtime. ) 60 tablet 0  . divalproex (DEPAKOTE ER) 500 MG 24 hr tablet Take 1,500 mg  by mouth daily.    Marland Kitchen. doxycycline (VIBRAMYCIN) 100 MG capsule Take 100 mg by mouth 2 (two) times daily.  2  . medroxyPROGESTERone (DEPO-PROVERA) 150 MG/ML injection Inject 150 mg into the muscle every 3 (three) months.    . metoprolol succinate (TOPROL-XL) 50 MG 24 hr tablet Take 1 tablet (50 mg total) by mouth daily. Take with or immediately following a meal. 30 tablet 0  . mirabegron ER (MYRBETRIQ) 25 MG TB24 tablet Take 25 mg by mouth daily.    . nicotine (NICODERM CQ - DOSED IN MG/24 HR) 7 mg/24hr patch Place 7 mg onto the skin daily.    . paliperidone (INVEGA SUSTENNA) 234 MG/1.5ML SUSY injection Inject 234 mg  into the muscle every 30 (thirty) days.      Social History   Socioeconomic History  . Marital status: Single    Spouse name: Not on file  . Number of children: Not on file  . Years of education: Not on file  . Highest education level: Not on file  Occupational History  . Not on file  Social Needs  . Financial resource strain: Not on file  . Food insecurity:    Worry: Not on file    Inability: Not on file  . Transportation needs:    Medical: Not on file    Non-medical: Not on file  Tobacco Use  . Smoking status: Former Smoker    Packs/day: 0.50    Years: 10.00    Pack years: 5.00    Types: Cigarettes  . Smokeless tobacco: Never Used  Substance and Sexual Activity  . Alcohol use: No    Comment: occ  . Drug use: No  . Sexual activity: Yes    Birth control/protection: None  Lifestyle  . Physical activity:    Days per week: Not on file    Minutes per session: Not on file  . Stress: Not on file  Relationships  . Social connections:    Talks on phone: Not on file    Gets together: Not on file    Attends religious service: Not on file    Active member of club or organization: Not on file    Attends meetings of clubs or organizations: Not on file    Relationship status: Not on file  . Intimate partner violence:    Fear of current or ex partner: Not on file    Emotionally abused: Not on file    Physically abused: Not on file    Forced sexual activity: Not on file  Other Topics Concern  . Not on file  Social History Narrative   Adopted   [place] Molly [name]   Transportation: Bus   Family History  Adopted: Yes  Problem Relation Age of Onset  . Bipolar disorder Sister   . Alcohol abuse Brother   . Cancer Father   . Diabetes Mother     OBJECTIVE:  Vitals:   12/14/17 1446  Pulse: (!) 114  Resp: 18  Temp: 98.2 F (36.8 C)  TempSrc: Oral  SpO2: 100%  Weight: 283 lb (128.4 kg)     General appearance: alert; appears  uncomfortable CV: RRR without murmur, gallop or rub Lungs: CTAB Skin: 6-7 x 3-4 cm abscess of her left side superior gluteal cleft; tender to touch; copious purulent active drainage Psychological: alert and cooperative; anxious mood and affect  Procedure: Verbal consent obtained from Maurilio Lovelyontae Wollard [name] Social Worker Legal Guardian. Area over induration cleaned with  betadine. Lidocaine 2% without epinephrine used to obtain local anesthesia approximately 10 cc. The most fluctuant portion of the abscess was incised with a #11 blade scalpel. Abscess cavity explored and evacuated. Loculations broken up with a curved hemostat as best as possible given patient discomfort. Cavity packed with packing material and dressed with a clean gauze dressing. Minimal bleeding. No complications.   ASSESSMENT & PLAN:  Discussed patient case with Dr. Tracie HarrierHagler  1. Abscess of left buttock     Meds ordered this encounter  Medications  . DISCONTD: morphine 2 MG/ML injection 4 mg  . morphine 4 MG/ML injection 4 mg  . sulfamethoxazole-trimethoprim (BACTRIM DS) 800-160 MG tablet    Sig: Take 1 tablet by mouth 2 (two) times daily for 10 days.    Dispense:  20 tablet    Refill:  0    Order Specific Question:   Supervising Provider    Answer:   Isa RankinMURRAY, LAURA WILSON [phone][[id number]]  . HYDROcodone-acetaminophen (NORCO/VICODIN) 5-325 MG tablet    Sig: Take 1 tablet by mouth every 4 (four) hours as needed for moderate pain.    Dispense:  10 tablet    Refill:  0    Order Specific Question:   Supervising Provider    Answer:   Isa RankinMURRAY, LAURA WILSON [[id number]]   Morpine given Incision and drainage performed Bandage applied.  Avoid get area wet for the next 24-48 hours.  Change bandage daily.   Keep area covered to avoid friction Bactrim prescribed Take antibiotic as prescribed and to completion Hydrocodone prescribed for severe break through pain.   Return in 48 hours to have packing removed and to reevaluate wound Return  sooner or go to the ED if you experience any new or worsening symptoms.  Such as increasing pain, swelling, redness, fever, chills, fatigue, etc...  Reviewed expectations re: course of current medical issues. Questions answered. Outlined signs and symptoms indicating need for more acute intervention. Patient verbalized understanding. After Visit Summary given.          Rennis HardingWurst, Taitum Alms, PA-C 12/14/17 1627

## 2017-12-14 NOTE — ED Triage Notes (Signed)
Pt states she has a abscess that has burst. Its been 2 days. Pt refused b/p reading.

## 2017-12-14 NOTE — Discharge Instructions (Addendum)
Morpine given Incision and drainage performed Bandage applied.  Avoid get area wet for the next 24-48 hours.  Change bandage daily.   Keep area covered to avoid friction Bactrim prescribed Take antibiotic as prescribed and to completion Hydrocodone prescribed for severe break through pain.   Return in 48 hours to have packing removed and to reevaluate wound Return sooner or go to the ED if you experience any new or worsening symptoms.  Such as increasing pain, swelling, redness, fever, chills, fatigue, etc...Molly [name]

## 2017-12-16 ENCOUNTER — Ambulatory Visit (HOSPITAL_COMMUNITY)
Admission: EM | Admit: 2017-12-16 | Discharge: 2017-12-16 | Disposition: A | Discharge status: Home / Self Care | Payer: Medicare Other

## 2017-12-16 ENCOUNTER — Encounter (HOSPITAL_COMMUNITY): Payer: Self-pay | Admitting: Emergency Medicine

## 2017-12-16 DIAGNOSIS — Z5189 Encounter for other specified aftercare: Secondary | ICD-10-CM

## 2017-12-16 DIAGNOSIS — L0231 Cutaneous abscess of buttock: Secondary | ICD-10-CM

## 2017-12-16 DIAGNOSIS — R000 Tachycardia, unspecified: Secondary | ICD-10-CM

## 2017-12-16 NOTE — ED Provider Notes (Signed)
[facility]   [id number] 12/16/17 Arrival Time: 1213   WU:[id number]:ABSCESS  SUBJECTIVE:  Levester FreshJessica S Klomp is a 32 y.o. female who follows up regarding wound recheck and packing removal.  Was seen on 12/14/17 for incision and drainage of left-sided buttock abscess.  Patient was started on bactrim at that time.  She has been compliant with antibiotic.  Patient reports improvement in symptoms.  Complains of mild bloody drainage and pain at incision site.  Denies fever, chills, nausea, vomiting, abdominal pain, redness, or swelling.    ROS: As per HPI.  Past Medical History:  Diagnosis Date  . Abnormal Pap smear   . ASC-cannot exclude HGSIL on Pap 02/15/2012   ASC-US on 02/03/2012 pap (associated Trichomonas infection). No reflex HPV testing performed on specimen.  Patient informed that she will need repeat Pap in one year.      . Asthma   . ATTENTION DEFICIT, W/O HYPERACTIVITY, History of 06/30/2006   Qualifier: History of  By: McDiarmid MD, Tawanna Coolerodd    . CERVICITIS, GONOCOCCAL, History of 01/09/2007   Qualifier: History of  By: McDiarmid MD, Tawanna Coolerodd    . 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 Depression   . Diabetes mellitus    diet controlled  . Eczema   . Hypertension   . Overactive bladder   . Schizophrenia (HCC)   . 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 Dennie Bibleichard Larsen (Psych) At [facility] in  [place], [place]. Qualifier: Hospitalized for  By: McDiarmid MD, Tawanna Coolerodd    . 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 TOBACCO USER 02/08/2009   Qualifier: Diagnosis of  By: Knox Royaltydell, Erin     Past Surgical History:  Procedure Laterality Date  . INCISION AND DRAINAGE     pilanodal cyst  . TOOTH EXTRACTION N/A 10/07/2017   Procedure: EXTRACTION TEETH NUMBERS ONE, SEVENTEEN, NINETEEN AND THIRTY TWO;  Surgeon: Ocie DoyneJensen, Scott, DDS;  Location: [facility] OR;   Service: Oral Surgery;  Laterality: N/A;   Allergies  Allergen Reactions  . Abilify [Aripiprazole] Other (See Comments)    Thinks it's nasty- does not want it.  Injection is ok.     No current facility-administered medications on file prior to encounter.    Current Outpatient Medications on File Prior to Encounter  Medication Sig Dispense Refill  . albuterol (PROVENTIL HFA;VENTOLIN HFA) 108 (90 Base) MCG/ACT inhaler Inhale 1 puff into the lungs every 6 (six) hours as needed for wheezing or shortness of breath.    Marland Kitchen. amoxicillin (AMOXIL) 500 MG capsule Take 1 capsule (500 mg total) by mouth 3 (three) times daily. 21 capsule 0  . chlorhexidine (HIBICLENS) 4 % external liquid Apply 1 application topically 2 (two) times daily as needed ([facility]    . Cholecalciferol (VITAMIN D) 2000 units CAPS Take 2,000 Units by mouth daily.     . clindamycin (CLEOCIN T) 1 % external solution Apply 1 application topically 2 (two) times daily.    . clotrimazole-betamethasone (LOTRISONE) cream Apply 1 application topically 2 (two) times daily.    . cloZAPine (CLOZARIL) 100 MG tablet Take 3 tablets (300 mg total) by mouth 2 (two) times daily. (Patient taking differently: Take 300 mg by mouth at bedtime. ) 60 tablet 0  . divalproex (DEPAKOTE ER) 500 MG 24 hr tablet Take 1,500 mg by mouth daily.    .Marland Kitchen  doxycycline (VIBRAMYCIN) 100 MG capsule Take 100 mg by mouth 2 (two) times daily.  2  . HYDROcodone-acetaminophen (NORCO/VICODIN) 5-325 MG tablet Take 1 tablet by mouth every 4 (four) hours as needed for moderate pain. 10 tablet 0  . medroxyPROGESTERone (DEPO-PROVERA) 150 MG/ML injection Inject 150 mg into the muscle every 3 (three) months.    . metoprolol succinate (TOPROL-XL) 50 MG 24 hr tablet Take 1 tablet (50 mg total) by mouth daily. Take with or immediately following a meal. 30 tablet 0  . mirabegron ER (MYRBETRIQ) 25 MG TB24 tablet Take 25 mg by mouth daily.    . nicotine (NICODERM CQ - DOSED IN MG/24 HR) 7  mg/24hr patch Place 7 mg onto the skin daily.    . paliperidone (INVEGA SUSTENNA) 234 MG/1.5ML SUSY injection Inject 234 mg into the muscle every 30 (thirty) days.     Marland Kitchen. sulfamethoxazole-trimethoprim (BACTRIM DS) 800-160 MG tablet Take 1 tablet by mouth 2 (two) times daily for 10 days. 20 tablet 0   Social History   Socioeconomic History  . Marital status: Single    Spouse name: Not on file  . Number of children: Not on file  . Years of education: Not on file  . Highest education level: Not on file  Occupational History  . Not on file  Social Needs  . Financial resource strain: Not on file  . Food insecurity:    Worry: Not on file    Inability: Not on file  . Transportation needs:    Medical: Not on file    Non-medical: Not on file  Tobacco Use  . Smoking status: Former Smoker    Packs/day: 0.50    Years: 10.00    Pack years: 5.00    Types: Cigarettes  . Smokeless tobacco: Never Used  Substance and Sexual Activity  . Alcohol use: No    Comment: occ  . Drug use: No  . Sexual activity: Yes    Birth control/protection: None  Lifestyle  . Physical activity:    Days per week: Not on file    Minutes per session: Not on file  . Stress: Not on file  Relationships  . Social connections:    Talks on phone: Not on file    Gets together: Not on file    Attends religious service: Not on file    Active member of club or organization: Not on file    Attends meetings of clubs or organizations: Not on file    Relationship status: Not on file  . Intimate partner violence:    Fear of current or ex partner: Not on file    Emotionally abused: Not on file    Physically abused: Not on file    Forced sexual activity: Not on file  Other Topics Concern  . Not on file  Social History Narrative   Adopted   [place] with [name]   Transportation: Bus   Family History  Adopted: Yes  Problem Relation Age of Onset  . Bipolar disorder Sister   . Alcohol abuse  Brother   . Cancer Father   . Diabetes Mother     OBJECTIVE:  Vitals:   12/16/17 1233  Pulse: (!) 141  Resp: 18  Temp: 98.8 F (37.1 C)  SpO2: 100%     General appearance: alert; appears uncomfortable; nontoxic appearance Skin: Two healing incision sites <2cm in length, post I&D procedure, localized to superior aspect of the left side of the gluteal  cleft; mildly tender to the touch; no active drainage; no obvious erythema; no obvious areas of induration; packing removed Psychological: alert and cooperative; agitated mood and affect   ASSESSMENT & PLAN:  1. Wound check, abscess   2. Tachycardia    Pulse rechecked at 130.  Reviewing past pulse readings showed patient has a hx of tachycardia ranging from 91-140 over the course of April of this year until now.  Recommended CBC with diff to evaluate for potential sepsis.  Patient refused and left prior to having blood work.  Call and spoke with Maurilio Lovely [name] Social Worker, patient's legal guardian, and notified her. Sherren Mocha RN, instructed patient to go to the ED if she had any new or worsening symptoms prior to leaving.    No orders of the defined types were placed in this encounter.  Recommended CBC with diff.  Patient refuses blood work.   Packing removed, and dressing applied Wash site daily with warm water and mild soap Change dressing daily Continue antibiotic as prescribed and to completion Continue pain medication as directed for management of pain Follow up here or with PCP if symptoms persists Return or go to the ED if you have any new or worsening symptoms  Reviewed expectations re: course of current medical issues. Questions answered. Outlined signs and symptoms indicating need for more acute intervention. Patient verbalized understanding. After Visit Summary given.          Rennis Harding, PA-C 12/16/17 1423

## 2017-12-16 NOTE — ED Notes (Signed)
Pt refused blood work. Advised patient that she needed to have more work up done due to her rapid heart rate. Patient stated she was not letting us draw her blood. Educated patient for high fevers or feeling any worse to please go to the ER for further evaluation and to continue her antibiotics. Pt verbalized understanding.

## 2017-12-16 NOTE — Discharge Instructions (Signed)
Packing removed, and dressing applied Wash site daily with warm water and mild soap Change dressing daily Continue antibiotic as prescribed and to completion Continue pain medication as directed for management of pain Follow up here or with PCP if symptoms persists Return or go to the ED if you have any new or worsening symptoms

## 2017-12-16 NOTE — ED Triage Notes (Signed)
Pt here for recheck of abscess.

## 2017-12-16 NOTE — ED Notes (Signed)
Pt HR is 130. Grenada[place]ittany PA notified.

## 2018-01-06 ENCOUNTER — Encounter (HOSPITAL_COMMUNITY): Payer: Self-pay | Admitting: Emergency Medicine

## 2018-01-06 ENCOUNTER — Ambulatory Visit (HOSPITAL_COMMUNITY)
Admission: EM | Admit: 2018-01-06 | Discharge: 2018-01-06 | Disposition: A | Discharge status: Home / Self Care | Payer: Medicare Other | Attending: Family Medicine | Admitting: Family Medicine

## 2018-01-06 DIAGNOSIS — L0231 Cutaneous abscess of buttock: Secondary | ICD-10-CM

## 2018-01-06 DIAGNOSIS — Z5189 Encounter for other specified aftercare: Secondary | ICD-10-CM | POA: Diagnosis not present

## 2018-01-06 MED ORDER — MUPIROCIN 2 % EX OINT: 1.0000 "application " | TOPICAL_OINTMENT | Freq: Two times a day (BID) | CUTANEOUS | 0 refills | Status: DC

## 2018-01-06 MED ORDER — DOXYCYCLINE HYCLATE 100 MG PO CAPS: 100.0000 mg | ORAL_CAPSULE | Freq: Two times a day (BID) | ORAL | 0 refills | Status: DC

## 2018-01-06 NOTE — Discharge Instructions (Addendum)
Please begin doxycycline for 10 days  Apply warm compresses/hot rags to area with massage to soften thickened tissue  Return if symptoms returning or not improving  We will call with lab results

## 2018-01-06 NOTE — ED Triage Notes (Signed)
Pt here for wound check to buttocks area seen here 3 weeks ago for same; ACT team with pt also requesting possible lab draw

## 2018-01-07 NOTE — ED Provider Notes (Signed)
[facility]    CSN: [id number] Arrival date & time: 01/06/18  1215     History   Chief Complaint Chief Complaint  Patient presents with  . Wound Check    HPI Molly [name] is a 32 y.o. female history of schizophrenia, tobacco use, hypertension, DM presenting today for evaluation of abscess.  Patient was here approximately 3 weeks ago for I&D of pilonidal abscess, states that symptoms relatively improved.  She still has a slight area of tenderness and hardness around the area of issue.  Denies previous issues with abscesses in this area.  Denies fevers.  Patient here is accompanied by the act team and helps work with her, they are requesting clozapine and CBC draw, they attempted to go to [facility] earlier today to have these drawn which are required every 6 months, but were unsuccessful successful with sticking patient.  HPI  Past Medical History:  Diagnosis Date  . Abnormal Pap smear   . ASC-cannot exclude HGSIL on Pap 02/15/2012   ASC-US on 02/03/2012 pap (associated Trichomonas infection). No reflex HPV testing performed on specimen.  Patient informed that she will need repeat Pap in one year.      . Asthma   . ATTENTION DEFICIT, W/O HYPERACTIVITY, History of 06/30/2006   Qualifier: History of  By: McDiarmid MD, Tawanna Cooler    . CERVICITIS, GONOCOCCAL, History of 01/09/2007   Qualifier: History of  By: McDiarmid MD, Tawanna Cooler    . 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 Depression   . Diabetes mellitus    diet controlled  . Eczema   . Hypertension   . Overactive bladder   . Schizophrenia (HCC)   . 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 Dennie Bible (Psych) At [facility] in  [place], [place]. Qualifier: Hospitalized for  By: McDiarmid MD, Tawanna Cooler    . 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 TOBACCO USER 02/08/2009   Qualifier: Diagnosis of  By: Knox Royalty      Patient Active Problem List   Diagnosis Date Noted  . Post-operative state 10/07/2017  . Disorganized schizophrenia (HCC)   . Psychoses (HCC)   . Borderline intellectual functioning   . Elevated WBC count 04/07/2014  . Noncompliance with medication regimen 04/07/2014  . Schizoaffective disorder-chronic with exacerbation (HCC) 02/06/2014  . Aggressive behavior 08/16/2013  . Papular rash, generalized 09/01/2012  . Amenorrhea 04/25/2012  . ASC-cannot exclude HGSIL on Pap 02/15/2012  . Screening for malignant neoplasm of the cervix 02/01/2012  . 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 Obesity, unspecified 02/10/2009  . TOBACCO USER 02/08/2009  . DIABETES MELLITUS 04/02/2008  . CERVICITIS, GONOCOCCAL, History of 01/09/2007  . TRICHOMONAL VAGINITIS 01/09/2007  . ECZEMA, ATOPIC DERMATITIS 06/30/2006    Past Surgical History:  Procedure Laterality Date  . INCISION AND DRAINAGE     pilanodal cyst  . TOOTH EXTRACTION N/A 10/07/2017   Procedure: EXTRACTION TEETH NUMBERS ONE, SEVENTEEN, NINETEEN AND THIRTY TWO;  Surgeon: Ocie Doyne, DDS;  Location: [facility];  Service: Oral Surgery;  Laterality: N/A;    OB History    Gravida  3   Para  1   Term  1   Preterm      AB      Living  1     SAB  TAB      Ectopic      Multiple      Live Births               Home Medications    Prior to Admission medications   Medication Sig Start Date End Date Taking? Authorizing Provider  albuterol (PROVENTIL HFA;VENTOLIN HFA) 108 (90 Base) MCG/ACT inhaler Inhale 1 puff into the lungs every 6 (six) hours as needed for wheezing or shortness of breath.    [provider]  amoxicillin (AMOXIL) 500 MG capsule Take 1 capsule (500 mg total) by mouth 3 (three) times daily. 10/07/17   Ocie Doyne, DDS  chlorhexidine (HIBICLENS) 4 % external liquid Apply 1 application topically 2 (two) times daily as needed (per [facility]).    [provider]  Cholecalciferol (VITAMIN D) 2000 units CAPS Take 2,000 Units by mouth daily.     [provider]  clindamycin (CLEOCIN T) 1 % external solution Apply 1 application topically 2 (two) times daily.    [provider]  clotrimazole-betamethasone (LOTRISONE) cream Apply 1 application topically 2 (two) times daily.    [provider]  cloZAPine (CLOZARIL) 100 MG tablet Take 3 tablets (300 mg total) by mouth 2 (two) times daily. Patient taking differently: Take 300 mg by mouth at bedtime.  05/29/16   Charm Rings, NP  divalproex (DEPAKOTE ER) 500 MG 24 hr tablet Take 1,500 mg by mouth daily.    [provider]  doxycycline (VIBRAMYCIN) 100 MG capsule Take 1 capsule (100 mg total) by mouth 2 (two) times daily. 01/06/18   Keyry Iracheta C, PA-C  HYDROcodone-acetaminophen (NORCO/VICODIN) 5-325 MG tablet Take 1 tablet by mouth every 4 (four) hours as needed for moderate pain. 12/14/17   Wurst, [place], PA-C  medroxyPROGESTERone (DEPO-PROVERA) 150 MG/ML injection Inject 150 mg into the muscle every 3 (three) months.    [provider]  metoprolol succinate (TOPROL-XL) 50 MG 24 hr tablet Take 1 tablet (50 mg total) by mouth daily. Take with or immediately following a meal. 05/29/16   Charm Rings, NP  mirabegron ER (MYRBETRIQ) 25 MG TB24 tablet Take 25 mg by mouth daily.    [provider]  mupirocin ointment (BACTROBAN) 2 % Place 1 application into the nose 2 (two) times daily. 01/06/18   Kemani Heidel C, PA-C  nicotine (NICODERM CQ - DOSED IN MG/24 HR) 7 mg/24hr patch Place 7 mg onto the skin daily.    [provider]  paliperidone (INVEGA SUSTENNA) 234 MG/1.5ML SUSY injection Inject 234 mg into the muscle every 30 (thirty) days.     [provider]    Family History Family History  Adopted: Yes  Problem Relation Age of Onset  . Bipolar disorder Sister   . Alcohol abuse Brother   . Cancer Father   . Diabetes  Mother     Social History Social History   Tobacco Use  . Smoking status: Former Smoker    Packs/day: 0.50    Years: 10.00    Pack years: 5.00    Types: Cigarettes  . Smokeless tobacco: Never Used  Substance Use Topics  . Alcohol use: No    Comment: occ  . Drug use: No     Allergies   Abilify [aripiprazole]   Review of Systems Review of Systems  Constitutional: Negative for fatigue and fever.  HENT: Negative for mouth sores.   Eyes: Negative for visual disturbance.  Respiratory: Negative for shortness of breath.  Cardiovascular: Negative for chest pain.  Gastrointestinal: Negative for abdominal pain, nausea and vomiting.  Genitourinary: Negative for genital sores.  Musculoskeletal: Negative for arthralgias and joint swelling.  Skin: Positive for color change and wound. Negative for rash.  Neurological: Negative for dizziness, weakness, light-headedness and headaches.     Physical Exam Triage Vital Signs ED Triage Vitals [01/06/18 1249]  Enc Vitals Group     BP (!) 133/94     Pulse Rate (!) 120     Resp 18     Temp 98.5 F (36.9 C)     Temp Source Oral     SpO2 100 %     Weight      Height      Head Circumference      Peak Flow      Pain Score      Pain Loc      Pain Edu?      Excl. in GC?    No data found.  Updated Vital Signs BP (!) 133/94 (BP Location: Left Arm)   Pulse (!) 120   Temp 98.5 F (36.9 C) (Oral)   Resp 18   SpO2 100%   Visual Acuity Right Eye Distance:   Left Eye Distance:   Bilateral Distance:    Right Eye Near:   Left Eye Near:    Bilateral Near:     Physical Exam  Constitutional: She is oriented to person, place, and time. She appears well-developed and well-nourished.  No acute distress  HENT:  Head: Normocephalic and atraumatic.  Nose: Nose normal.  Eyes: Conjunctivae are normal.  Neck: Neck supple.  Cardiovascular:  Tachycardic  Pulmonary/Chest: Effort normal. No respiratory distress.  Abdominal: She  exhibits no distension.  Musculoskeletal: Normal range of motion.  Neurological: She is alert and oriented to person, place, and time.  Skin: Skin is warm and dry.  Indurated and tender area to left buttock superior near her pilonidal area, well-healed central scar, minimal fluctuance  Psychiatric: She has a normal mood and affect.  Nursing note and vitals reviewed.    UC Treatments / Results  Labs (all labs ordered are listed, but only abnormal results are displayed) Labs Reviewed - No data to display  EKG None  Radiology No results found.  Procedures Procedures (including critical care time)  Medications Ordered in UC Medications - No data to display  Initial Impression / Assessment and Plan / UC Course  I have reviewed the triage vital signs and the nursing notes.  Pertinent labs & imaging results that were available during my care of the patient were reviewed by me and considered in my medical decision making (see chart for details).     Lab draw attempted, but was unsuccessful.  Will put patient on a course of Bactrim to try to prevent further progression of abscess, also provided Bactroban as patient was requesting an appointment.  Continue to monitor, follow-up if symptoms continuing to progress.  Patient was noted to be tachycardic, discussed this with psych coordinator, states that this is normal for her at the doctor, but when she has her vitals checked otherwise it is not typically elevated.  Discussed with monitoring this and returning if this persistently stays elevated.  Does appear to be consistently elevated from prior visits.Discussed strict return precautions. Patient verbalized understanding and is agreeable with plan.  Final Clinical Impressions(s) / UC Diagnoses   Final diagnoses:  Wound check, abscess     Discharge Instructions     Please  begin doxycycline for 10 days  Apply warm compresses/hot rags to area with massage to soften thickened  tissue  Return if symptoms returning or not improving  We will call with lab results   ED Prescriptions    Medication Sig Dispense Auth. Provider   doxycycline (VIBRAMYCIN) 100 MG capsule Take 1 capsule (100 mg total) by mouth 2 (two) times daily. 20 capsule Joaquina Nissen C, PA-C   mupirocin ointment (BACTROBAN) 2 % Place 1 application into the nose 2 (two) times daily. 22 g Kharisma Glasner, [place] C, PA-C     Controlled Substance Prescriptions [place] Controlled Substance Registry consulted? Not Applicable   Lew Dawes, [place] 01/07/18 [phone]

## 2018-03-20 ENCOUNTER — Encounter (HOSPITAL_COMMUNITY): Payer: Self-pay

## 2018-03-20 ENCOUNTER — Ambulatory Visit (HOSPITAL_COMMUNITY)
Admission: EM | Admit: 2018-03-20 | Discharge: 2018-03-20 | Disposition: A | Discharge status: Home / Self Care | Payer: Medicare Other | Attending: Family Medicine | Admitting: Family Medicine

## 2018-03-20 DIAGNOSIS — R05 Cough: Secondary | ICD-10-CM

## 2018-03-20 DIAGNOSIS — J069 Acute upper respiratory infection, unspecified: Secondary | ICD-10-CM

## 2018-03-20 DIAGNOSIS — J4521 Mild intermittent asthma with (acute) exacerbation: Secondary | ICD-10-CM | POA: Diagnosis not present

## 2018-03-20 DIAGNOSIS — B9789 Other viral agents as the cause of diseases classified elsewhere: Secondary | ICD-10-CM | POA: Diagnosis not present

## 2018-03-20 MED ORDER — BENZONATATE 200 MG PO CAPS: 200.0000 mg | ORAL_CAPSULE | Freq: Two times a day (BID) | ORAL | 0 refills | Status: DC | PRN

## 2018-03-20 MED ORDER — PREDNISONE 20 MG PO TABS: 20.0000 mg | ORAL_TABLET | Freq: Two times a day (BID) | ORAL | 0 refills | Status: DC

## 2018-03-20 MED ORDER — IPRATROPIUM-ALBUTEROL 0.5-2.5 (3) MG/3ML IN SOLN
3.0000 mL | Freq: Once | RESPIRATORY_TRACT | Status: AC
  Administered 2018-03-20: 3 mL via RESPIRATORY_TRACT

## 2018-03-20 MED ORDER — IPRATROPIUM-ALBUTEROL 0.5-2.5 (3) MG/3ML IN SOLN
RESPIRATORY_TRACT | Status: AC
  Filled 2018-03-20: qty 3

## 2018-03-20 MED ORDER — ALBUTEROL SULFATE HFA 108 (90 BASE) MCG/ACT IN AERS: 1.0000 | INHALATION_SPRAY | Freq: Four times a day (QID) | RESPIRATORY_TRACT | 0 refills | Status: DC | PRN

## 2018-03-20 NOTE — Discharge Instructions (Signed)
Drink plenty of fluids Take prednisone 2 times a day as directed Use inhaler as needed for shortness of breath Use the Tessalon twice a day as needed for cough Get plenty of rest Follow-up here or with your PCP if you do not see improvement

## 2018-03-20 NOTE — ED Triage Notes (Signed)
Pt presents with persistent barky cough for over 2 weeks and chest congestion.

## 2018-03-20 NOTE — ED Provider Notes (Signed)
[facility]    CSN: [id number] Arrival date & time: 03/20/18  1232     History   Chief Complaint Chief Complaint  Patient presents with  . Cough  . Congestion    HPI Molly [name] is a 32 y.o. female.   HPI  Patient states that she has a cough for 2 weeks.  She has been having some wheezing.  She is out of her inhaler.  She states she needs her "breathing machine".  Apparently she had a nebulizer when she was younger.  No fever or chills.  No sputum.  No headache.  No runny or stuffy nose.  Mostly here for coughing.  Appetite is good.  Is here by herself initially, then a guardian came later.  No additional history.  Apparently patient has limited decision-making ability/fund of knowledge.  Past Medical History:  Diagnosis Date  . Abnormal Pap smear   . ASC-cannot exclude HGSIL on Pap 02/15/2012   ASC-US on 02/03/2012 pap (associated Trichomonas infection). No reflex HPV testing performed on specimen.  Patient informed that she will need repeat Pap in one year.      . Asthma   . ATTENTION DEFICIT, W/O HYPERACTIVITY, History of 06/30/2006   Qualifier: History of  By: McDiarmid MD, Tawanna Cooler    . CERVICITIS, GONOCOCCAL, History of 01/09/2007   Qualifier: History of  By: McDiarmid MD, Tawanna Cooler    . 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 Depression   . Diabetes mellitus    diet controlled  . Eczema   . Hypertension   . Overactive bladder   . Schizophrenia (HCC)   . 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 Dennie Bible (Psych) At [facility] in  [place], [place]. Qualifier: Hospitalized for  By: McDiarmid MD, Tawanna Cooler    . 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 TOBACCO USER 02/08/2009   Qualifier: Diagnosis of  By: Knox Royalty      Patient Active Problem List   Diagnosis Date Noted  . Post-operative state 10/07/2017    . Disorganized schizophrenia (HCC)   . Psychoses (HCC)   . Borderline intellectual functioning   . Elevated WBC count 04/07/2014  . Noncompliance with medication regimen 04/07/2014  . Schizoaffective disorder-chronic with exacerbation (HCC) 02/06/2014  . Aggressive behavior 08/16/2013  . Papular rash, generalized 09/01/2012  . Amenorrhea 04/25/2012  . ASC-cannot exclude HGSIL on Pap 02/15/2012  . Screening for malignant neoplasm of the cervix 02/01/2012  . 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 Obesity, unspecified 02/10/2009  . TOBACCO USER 02/08/2009  . DIABETES MELLITUS 04/02/2008  . CERVICITIS, GONOCOCCAL, History of 01/09/2007  . TRICHOMONAL VAGINITIS 01/09/2007  . ECZEMA, ATOPIC DERMATITIS 06/30/2006    Past Surgical History:  Procedure Laterality Date  . INCISION AND DRAINAGE     pilanodal cyst  . TOOTH EXTRACTION N/A 10/07/2017   Procedure: EXTRACTION TEETH NUMBERS ONE, SEVENTEEN, NINETEEN AND THIRTY TWO;  Surgeon: Ocie Doyne, DDS;  Location: [facility];  Service: Oral Surgery;  Laterality: N/A;    OB History    Gravida  3   Para  1   Term  1   Preterm      AB      Living  1     SAB      TAB  Ectopic      Multiple      Live Births               Home Medications    Prior to Admission medications   Medication Sig Start Date End Date Taking? Authorizing Provider  albuterol (PROVENTIL HFA;VENTOLIN HFA) 108 (90 Base) MCG/ACT inhaler Inhale 1 puff into the lungs every 6 (six) hours as needed for wheezing or shortness of breath. 03/20/18   Eustace Moore, MD  benzonatate (TESSALON) 200 MG capsule Take 1 capsule (200 mg total) by mouth 2 (two) times daily as needed for cough. 03/20/18   Eustace Moore, MD  chlorhexidine (HIBICLENS) 4 % external liquid Apply 1 application topically 2 (two) times daily as needed (per [facility]).    [provider]  Cholecalciferol (VITAMIN D) 2000 units CAPS Take 2,000 Units by mouth daily.      [provider]  clindamycin (CLEOCIN T) 1 % external solution Apply 1 application topically 2 (two) times daily.    [provider]  cloZAPine (CLOZARIL) 100 MG tablet Take 3 tablets (300 mg total) by mouth 2 (two) times daily. Patient taking differently: Take 300 mg by mouth at bedtime.  05/29/16   Charm Rings, NP  divalproex (DEPAKOTE ER) 500 MG 24 hr tablet Take 1,500 mg by mouth daily.    [provider]  HYDROcodone-acetaminophen (NORCO/VICODIN) 5-325 MG tablet Take 1 tablet by mouth every 4 (four) hours as needed for moderate pain. 12/14/17   Wurst, [place], PA-C  medroxyPROGESTERone (DEPO-PROVERA) 150 MG/ML injection Inject 150 mg into the muscle every 3 (three) months.    [provider]  metoprolol succinate (TOPROL-XL) 50 MG 24 hr tablet Take 1 tablet (50 mg total) by mouth daily. Take with or immediately following a meal. 05/29/16   Charm Rings, NP  mirabegron ER (MYRBETRIQ) 25 MG TB24 tablet Take 25 mg by mouth daily.    [provider]  mupirocin ointment (BACTROBAN) 2 % Place 1 application into the nose 2 (two) times daily. 01/06/18   Wieters, Hallie C, PA-C  nicotine (NICODERM CQ - DOSED IN MG/24 HR) 7 mg/24hr patch Place 7 mg onto the skin daily.    [provider]  paliperidone (INVEGA SUSTENNA) 234 MG/1.5ML SUSY injection Inject 234 mg into the muscle every 30 (thirty) days.     [provider]  predniSONE (DELTASONE) 20 MG tablet Take 1 tablet (20 mg total) by mouth 2 (two) times daily with a meal. 03/20/18   Eustace Moore, MD    Family History Family History  Adopted: Yes  Problem Relation Age of Onset  . Bipolar disorder Sister   . Alcohol abuse Brother   . Cancer Father   . Diabetes Mother     Social History Social History   Tobacco Use  . Smoking status: Former Smoker    Packs/day: 0.50    Years: 10.00    Pack years: 5.00    Types: Cigarettes  . Smokeless tobacco: Never Used  Substance  Use Topics  . Alcohol use: No    Comment: occ  . Drug use: No     Allergies   Abilify [aripiprazole]   Review of Systems Review of Systems  Constitutional: Negative for chills and fever.  HENT: Negative for dental problem, ear pain, rhinorrhea and sore throat.   Eyes: Negative for pain and visual disturbance.  Respiratory: Positive for cough, shortness of breath and wheezing.   Cardiovascular: Negative for  chest pain and palpitations.  Gastrointestinal: Negative for abdominal pain and vomiting.  Genitourinary: Negative for dysuria and hematuria.  Musculoskeletal: Negative for arthralgias and back pain.  Skin: Negative for color change and rash.  Neurological: Negative for seizures and syncope.  Psychiatric/Behavioral: Positive for decreased concentration and sleep disturbance.  All other systems reviewed and are negative.    Physical Exam Triage Vital Signs ED Triage Vitals  Enc Vitals Group     BP 03/20/18 1346 (!) 135/95     Pulse Rate 03/20/18 1346 (!) 130-repeat 98     Resp 03/20/18 1346 (!) 22     Temp 03/20/18 1346 98.8 F (37.1 C)     Temp Source 03/20/18 1346 Oral     SpO2 03/20/18 1346 99 %     Weight --      Height --      Head Circumference --      Peak Flow --      Pain Score 03/20/18 1347 5     Pain Loc --      Pain Edu? --      Excl. in GC? --    No data found.  Updated Vital Signs BP (!) 135/95 (BP Location: Right Arm)   Pulse (!) 130   Temp 98.8 F (37.1 C) (Oral)   Resp (!) 22   SpO2 99%       Physical Exam  Constitutional: She appears well-developed and well-nourished. No distress.  HENT:  Head: Normocephalic and atraumatic.  Right Ear: External ear normal.  Left Ear: External ear normal.  Mouth/Throat: Oropharynx is clear and moist.  Eyes: Pupils are equal, round, and reactive to light. Conjunctivae are normal.  Neck: Normal range of motion.  Cardiovascular: Normal rate, regular rhythm and normal heart sounds.  Pulmonary/Chest:  Effort normal. No respiratory distress. She has wheezes.  Initially patient had decreased lung sounds, wheezing throughout, few rhonchi.  After nebulized treatment she had better air movement.  Few scattered wheeze.  No rales or rhonchi.  Abdominal: Soft. She exhibits no distension.  Musculoskeletal: Normal range of motion. She exhibits no edema.  Neurological: She is alert.  Skin: Skin is warm and dry.  Psychiatric: Her behavior is normal.  Vitals reviewed.    UC Treatments / Results  Labs (all labs ordered are listed, but only abnormal results are displayed) Labs Reviewed - No data to display  EKG None  Radiology No results found.  Procedures Procedures (including critical care time)  Medications Ordered in UC Medications  ipratropium-albuterol (DUONEB) 0.5-2.5 (3) MG/3ML nebulizer solution 3 mL (3 mLs Nebulization Given 03/20/18 1432)    Initial Impression / Assessment and Plan / UC Course  I have reviewed the triage vital signs and the nursing notes.  Pertinent labs & imaging results that were available during my care of the patient were reviewed by me and considered in my medical decision making (see chart for details).     Believe she has an asthma exacerbation.  Do not see any signs that she needs an antibiotic.  She probably has a viral bronchitis.  We will give her an albuterol inhaler.  Prednisone.  Tessalon.  She should push fluids.  Return if not better in a week. Final Clinical Impressions(s) / UC Diagnoses   Final diagnoses:  Mild intermittent asthma with acute exacerbation  Viral URI with cough     Discharge Instructions     Drink plenty of fluids Take prednisone 2 times a day as directed Use  inhaler as needed for shortness of breath Use the Tessalon twice a day as needed for cough Get plenty of rest Follow-up here or with your PCP if you do not see improvement    ED Prescriptions    Medication Sig Dispense Auth. Provider   predniSONE  (DELTASONE) 20 MG tablet Take 1 tablet (20 mg total) by mouth 2 (two) times daily with a meal. 10 tablet Eustace MooreNelson, Nettie Wyffels Sue, MD   benzonatate (TESSALON) 200 MG capsule Take 1 capsule (200 mg total) by mouth 2 (two) times daily as needed for cough. 20 capsule Eustace MooreNelson, Hurschel Paynter Sue, MD   albuterol (PROVENTIL HFA;VENTOLIN HFA) 108 (90 Base) MCG/ACT inhaler Inhale 1 puff into the lungs every 6 (six) hours as needed for wheezing or shortness of breath. 1 Inhaler Eustace MooreNelson, Muhsin Doris Sue, MD     Controlled Substance Prescriptions [place] Controlled Substance Registry consulted? Not Applicable   Eustace MooreNelson, Nalea Salce Sue, MD 03/20/18 2109

## 2018-10-17 ENCOUNTER — Observation Stay (HOSPITAL_COMMUNITY)
Admission: RE | Admit: 2018-10-17 | Discharge: 2018-10-18 | Disposition: A | Discharge status: Home / Self Care | Payer: Medicare Other | Attending: Psychiatry | Admitting: Psychiatry

## 2018-10-17 ENCOUNTER — Encounter (HOSPITAL_COMMUNITY): Payer: Self-pay

## 2018-10-17 DIAGNOSIS — Z818 Family history of other mental and behavioral disorders: Secondary | ICD-10-CM | POA: Insufficient documentation

## 2018-10-17 DIAGNOSIS — Z79899 Other long term (current) drug therapy: Secondary | ICD-10-CM | POA: Diagnosis not present

## 2018-10-17 DIAGNOSIS — J45909 Unspecified asthma, uncomplicated: Secondary | ICD-10-CM | POA: Diagnosis not present

## 2018-10-17 DIAGNOSIS — R41843 Psychomotor deficit: Secondary | ICD-10-CM | POA: Insufficient documentation

## 2018-10-17 DIAGNOSIS — Z716 Tobacco abuse counseling: Secondary | ICD-10-CM | POA: Diagnosis not present

## 2018-10-17 DIAGNOSIS — Z833 Family history of diabetes mellitus: Secondary | ICD-10-CM | POA: Diagnosis not present

## 2018-10-17 DIAGNOSIS — F209 Schizophrenia, unspecified: Secondary | ICD-10-CM | POA: Diagnosis present

## 2018-10-17 DIAGNOSIS — F202 Catatonic schizophrenia: Secondary | ICD-10-CM | POA: Insufficient documentation

## 2018-10-17 DIAGNOSIS — Z793 Long term (current) use of hormonal contraceptives: Secondary | ICD-10-CM | POA: Insufficient documentation

## 2018-10-17 DIAGNOSIS — Z888 Allergy status to other drugs, medicaments and biological substances status: Secondary | ICD-10-CM | POA: Insufficient documentation

## 2018-10-17 DIAGNOSIS — I10 Essential (primary) hypertension: Secondary | ICD-10-CM | POA: Diagnosis not present

## 2018-10-17 DIAGNOSIS — F17210 Nicotine dependence, cigarettes, uncomplicated: Secondary | ICD-10-CM | POA: Diagnosis not present

## 2018-10-17 DIAGNOSIS — F200 Paranoid schizophrenia: Principal | ICD-10-CM | POA: Insufficient documentation

## 2018-10-17 DIAGNOSIS — G4700 Insomnia, unspecified: Secondary | ICD-10-CM | POA: Diagnosis not present

## 2018-10-17 DIAGNOSIS — F329 Major depressive disorder, single episode, unspecified: Secondary | ICD-10-CM | POA: Insufficient documentation

## 2018-10-17 DIAGNOSIS — E119 Type 2 diabetes mellitus without complications: Secondary | ICD-10-CM | POA: Diagnosis not present

## 2018-10-17 MED ORDER — MAGNESIUM HYDROXIDE 400 MG/5ML PO SUSP: 30.0000 mL | Freq: Every day | ORAL | Status: DC | PRN

## 2018-10-17 MED ORDER — HYDROXYZINE HCL 25 MG PO TABS: 25.0000 mg | ORAL_TABLET | Freq: Three times a day (TID) | ORAL | Status: DC | PRN

## 2018-10-17 MED ORDER — ACETAMINOPHEN 325 MG PO TABS: 650.0000 mg | ORAL_TABLET | Freq: Four times a day (QID) | ORAL | Status: DC | PRN

## 2018-10-17 MED ORDER — ALUM & MAG HYDROXIDE-SIMETH 200-200-20 MG/5ML PO SUSP: 30.0000 mL | ORAL | Status: DC | PRN

## 2018-10-17 NOTE — BH Assessment (Addendum)
Assessment Note  Molly [name] is a 33 y.o. female who [facility] due to experiencing feelings of paranoia. Pt shared she has been paranoid, feeling as if shadows, spirits, and demons have been following her around since she was little. Pt shares she has been taking medications, including Clozaril and Depakote, but that they have not been working as well as they could be. Pt denies SI, and previous SI, any prior attempts to kill herself, or any prior hospitalizations. Pt denied HI, AVH, NSSIB, access to guns/weapons, involvement in the legal system, and SA.  Pt shares she lives independently; she shares she has no family. Pt states she is not married and that she [facility] by a Emergency planning/management officerpolice officer.  Pt has a legal guardian; clinician left a HIPAA-compliant voicemail message requesting the DSS worker return my call at her earliest convenience to ensure the worker can be informed of pt's admission into the hospital.  Pt's MSE was complete, though she did not know what city she was in. Pt's recent and remote memory was UTD. Pt was paranoid near the end of the assessment and was, thus, not able to answer all questions. Pt's insight, judgement, and impulse control is impaired at this time  Diagnosis: F20.9, Schizophrenia  Past Medical History:  Past Medical History:  Diagnosis Date  . Abnormal Pap smear   . ASC-cannot exclude HGSIL on Pap 02/15/2012   ASC-US on 02/03/2012 pap (associated Trichomonas infection). No reflex HPV testing performed on specimen.  Patient informed that she will need repeat Pap in one year.      . Asthma   . ATTENTION DEFICIT, W/O HYPERACTIVITY, History of 06/30/2006   Qualifier: History of  By: McDiarmid MD, Tawanna Coolerodd    . CERVICITIS, GONOCOCCAL, History of 01/09/2007   Qualifier: History of  By: McDiarmid MD, Tawanna Coolerodd    . 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 Depression   . Diabetes  mellitus    diet controlled  . Eczema   . Hypertension   . Overactive bladder   . Schizophrenia (HCC)   . 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 Dennie Bibleichard Larsen (Psych) At [facility] in  [place], [place]. Qualifier: Hospitalized for  By: McDiarmid MD, Tawanna Coolerodd    . 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 TOBACCO USER 02/08/2009   Qualifier: Diagnosis of  By: Knox Royaltydell, Erin      Past Surgical History:  Procedure Laterality Date  . INCISION AND DRAINAGE     pilanodal cyst  . TOOTH EXTRACTION N/A 10/07/2017   Procedure: EXTRACTION TEETH NUMBERS ONE, SEVENTEEN, NINETEEN AND THIRTY TWO;  Surgeon: Ocie DoyneJensen, Scott, DDS;  Location: [facility] OR;  Service: Oral Surgery;  Laterality: N/A;    Family History:  Family History  Adopted: Yes  Problem Relation Age of Onset  . Bipolar disorder Sister   . Alcohol abuse Brother   . Cancer Father   . Diabetes Mother     Social History:  reports that she has quit smoking. Her smoking use included cigarettes. She has a 5.00 pack-year smoking history. She has never used smokeless tobacco. She reports that she does not drink alcohol or use drugs.  Additional Social History:  Alcohol / Drug Use Pain Medications: Please see MAR Prescriptions: Please see MAR Over the  Counter: Please see MAR History of alcohol / drug use?: No history of alcohol / drug abuse Longest period of sobriety (when/how long): Pt denies use of substances  CIWA: CIWA-Ar BP: (!) 145/103 Pulse Rate: (!) 135 COWS:    Allergies:  Allergies  Allergen Reactions  . Abilify [Aripiprazole] Other (See Comments)    Thinks it's nasty- does not want it.  Injection is ok.      Home Medications:  Medications Prior to Admission  Medication Sig Dispense Refill  . albuterol (PROVENTIL HFA;VENTOLIN HFA) 108 (90 Base) MCG/ACT inhaler Inhale 1 puff into the lungs every 6 (six) hours as needed for  wheezing or shortness of breath. 1 Inhaler 0  . benzonatate (TESSALON) 200 MG capsule Take 1 capsule (200 mg total) by mouth 2 (two) times daily as needed for cough. 20 capsule 0  . chlorhexidine (HIBICLENS) 4 % external liquid Apply 1 application topically 2 (two) times daily as needed (per [facility]).    . Cholecalciferol (VITAMIN D) 2000 units CAPS Take 2,000 Units by mouth daily.     . clindamycin (CLEOCIN T) 1 % external solution Apply 1 application topically 2 (two) times daily.    . cloZAPine (CLOZARIL) 100 MG tablet Take 3 tablets (300 mg total) by mouth 2 (two) times daily. (Patient taking differently: Take 300 mg by mouth at bedtime. ) 60 tablet 0  . divalproex (DEPAKOTE ER) 500 MG 24 hr tablet Take 1,500 mg by mouth daily.    Marland Kitchen HYDROcodone-acetaminophen (NORCO/VICODIN) 5-325 MG tablet Take 1 tablet by mouth every 4 (four) hours as needed for moderate pain. 10 tablet 0  . medroxyPROGESTERone (DEPO-PROVERA) 150 MG/ML injection Inject 150 mg into the muscle every 3 (three) months.    . metoprolol succinate (TOPROL-XL) 50 MG 24 hr tablet Take 1 tablet (50 mg total) by mouth daily. Take with or immediately following a meal. 30 tablet 0  . mirabegron ER (MYRBETRIQ) 25 MG TB24 tablet Take 25 mg by mouth daily.    . mupirocin ointment (BACTROBAN) 2 % Place 1 application into the nose 2 (two) times daily. 22 g 0  . nicotine (NICODERM CQ - DOSED IN MG/24 HR) 7 mg/24hr patch Place 7 mg onto the skin daily.    . paliperidone (INVEGA SUSTENNA) 234 MG/1.5ML SUSY injection Inject 234 mg into the muscle every 30 (thirty) days.     . predniSONE (DELTASONE) 20 MG tablet Take 1 tablet (20 mg total) by mouth 2 (two) times daily with a meal. 10 tablet 0    OB/GYN Status:  No LMP recorded. (Menstrual status: Irregular Periods).  General Assessment Data TTS Assessment: In system Is this a Tele or Face-to-Face Assessment?: Face-to-Face Is this an Initial Assessment or a Re-assessment for this encounter?: Initial  Assessment Patient Accompanied by:: N/A Language Other than English: No Living Arrangements: Other (Comment)(Pt lives independently) What gender do you identify as?: Female Marital status: Single Maiden name: Epperly Pregnancy Status: No Living Arrangements: Alone Can pt return to current living arrangement?: Yes Admission Status: Voluntary Is patient capable of signing voluntary admission?: Yes Referral Source: Self/Family/Friend Insurance type: Medicare  Medical Screening Exam ([place]) Medical Exam completed: Yes  Crisis Care Plan Living Arrangements: Alone Legal Guardian: Other:([name], [place]) Name of Psychiatrist: Unknown Name of Therapist: Unknown  Education Status Is patient currently in school?: No Is the patient employed, unemployed or receiving disability?: Receiving disability income  Risk to self with the past 6 months Suicidal Ideation: No Has patient been  a risk to self within the past 6 months prior to admission? : No Suicidal Intent: No Has patient had any suicidal intent within the past 6 months prior to admission? : No Is patient at risk for suicide?: No Suicidal Plan?: No Has patient had any suicidal plan within the past 6 months prior to admission? : No Access to Means: No What has been your use of drugs/alcohol within the last 12 months?: Pt denies SA Previous Attempts/Gestures: No How many times?: 0 Other Self Harm Risks: None noted Triggers for Past Attempts: None known Intentional Self Injurious Behavior: None Family Suicide History: Unknown Recent stressful life event(s): Trauma (Comment)(Pt has been experiencing paranoia) Persecutory voices/beliefs?: No Depression: No Depression Symptoms: Feeling worthless/self pity Substance abuse history and/or treatment for substance abuse?: No Suicide prevention information given to non-admitted patients: Not applicable  Risk to Others within the past 6 months Homicidal Ideation: No Does  patient have any lifetime risk of violence toward others beyond the six months prior to admission? : No Thoughts of Harm to Others: No Current Homicidal Intent: No Current Homicidal Plan: No Access to Homicidal Means: No Identified Victim: None noted History of harm to others?: No Assessment of Violence: On admission Violent Behavior Description: None noted Does patient have access to weapons?: No(Pt denied access to weapons/guns) Criminal Charges Pending?: No Does patient have a court date: No Is patient on probation?: No  Psychosis Hallucinations: Auditory Delusions: None noted  Mental Status Report Appearance/Hygiene: Unremarkable Eye Contact: Fair Motor Activity: Freedom of movement, Other (Comment)(Pt is sitting on a chair in the lobby) Speech: Slow, Other (Comment)(Pt takes a while to answer each question; answer is minimal) Level of Consciousness: Irritable Mood: Suspicious Affect: Appropriate to circumstance Anxiety Level: Moderate Thought Processes: Circumstantial Judgement: Impaired Orientation: Unable to assess Obsessive Compulsive Thoughts/Behaviors: Minimal  Cognitive Functioning Concentration: Decreased Memory: Unable to Assess Is patient IDD: No Insight: Fair Impulse Control: Poor Appetite: Good Have you had any weight changes? : No Change Sleep: No Change Total Hours of Sleep: 7 Vegetative Symptoms: None  ADLScreening [facility]) Patient's cognitive ability adequate to safely complete daily activities?: Yes Patient able to express need for assistance with ADLs?: Yes Independently performs ADLs?: Yes (appropriate for developmental age)  Prior Inpatient Therapy Prior Inpatient Therapy: (UTA)  Prior Outpatient Therapy Prior Outpatient Therapy: (UTA)  ADL Screening (condition at time of admission) Patient's cognitive ability adequate to safely complete daily activities?: Yes Is the patient deaf or have difficulty hearing?: No Does  the patient have difficulty seeing, even when wearing glasses/contacts?: No Does the patient have difficulty concentrating, remembering, or making decisions?: Yes Patient able to express need for assistance with ADLs?: Yes Does the patient have difficulty dressing or bathing?: No Independently performs ADLs?: Yes (appropriate for developmental age) Does the patient have difficulty walking or climbing stairs?: No Weakness of Legs: None Weakness of Arms/Hands: None  Home Assistive Devices/Equipment Home Assistive Devices/Equipment: None  Therapy Consults (therapy consults require a physician order) PT Evaluation Needed: No OT Evalulation Needed: No SLP Evaluation Needed: No Abuse/Neglect Assessment (Assessment to be complete while patient is alone) Abuse/Neglect Assessment Can Be Completed: Unable to assess, patient is non-responsive or altered mental status Values / Beliefs Cultural Requests During Hospitalization: None Spiritual Requests During Hospitalization: None Consults Spiritual Care Consult Needed: No Social Work Consult Needed: No Merchant navy officerAdvance Directives (For Healthcare) Does Patient Have a Medical Advance Directive?: Unable to assess, patient is non-responsive or altered mental status  Disposition: Nira Conn, NP, reviewed pt's chart and information and determined does meet inpatient criteria. There are currently no appropriate beds available for pt at [facility] for pt, so pt's referral information will be faxed out to multiple hospitals for potential placement.  Disposition Initial Assessment Completed for this Encounter: Yes Disposition of Patient: Admit(Jason Allyson SabalBerry, NP, determined pt meets inpatient criteria) Type of inpatient treatment program: Adult Patient refused recommended treatment: No Mode of transportation if patient is discharged/movement?: N/A Patient referred to: Other (Comment)(Pt's referral information will be faxed to multiple hospital)  On Site Evaluation by:   Reviewed with Physician:    Ralph DowdySamantha L Shakeisha Horine 10/17/2018 10:57 PM

## 2018-10-17 NOTE — Progress Notes (Signed)
   10/17/18 2225  COVID-19 Daily Checkoff  Have you had a fever (temp > 37.80C/100F)  in the past 24 hours?  No  COVID-19 EXPOSURE  Have you traveled outside the state in the past 14 days? No  Have you been in contact with someone with a confirmed diagnosis of COVID-19 or PUI in the past 14 days without wearing appropriate PPE? No  Have you been living in the same home as a person with confirmed diagnosis of COVID-19 or a PUI (household contact)? No  Have you been diagnosed with COVID-19? No

## 2018-10-17 NOTE — H&P (Signed)
Behavioral Health Medical Screening Exam  Molly [name] is an 33 y.o. female.  Total Time spent with patient: 30 minutes  Psychiatric Specialty Exam: Physical Exam  Constitutional: She is oriented to person, place, and time. She appears well-developed and well-nourished. No distress.  HENT:  Head: Normocephalic and atraumatic.  Right Ear: External ear normal.  Left Ear: External ear normal.  Eyes: Pupils are equal, round, and reactive to light. Conjunctivae are normal. Right eye exhibits no discharge. Left eye exhibits no discharge. No scleral icterus.  Respiratory: Effort normal. No respiratory distress.  Musculoskeletal: Normal range of motion.  Neurological: She is alert and oriented to person, place, and time.  Skin: She is not diaphoretic.    Review of Systems  Constitutional: Negative for chills, diaphoresis, fever, malaise/fatigue and weight loss.  Respiratory: Negative for cough and shortness of breath.   Cardiovascular: Negative for chest pain.  Gastrointestinal: Negative for diarrhea, nausea and vomiting.  Psychiatric/Behavioral: Positive for depression. Negative for hallucinations, memory loss, substance abuse and suicidal ideas. The patient is nervous/anxious and has insomnia.     Blood pressure (!) 145/103, pulse (!) 135, temperature 98.3 F (36.8 C), temperature source Oral, resp. rate 18, SpO2 99 %, unknown if currently breastfeeding.There is no height or weight on file to calculate BMI.  General Appearance: Casual and Fairly Groomed  Eye Contact:  Good  Speech:  Clear and Coherent and Pressured  Volume:  Normal  Mood:  Anxious and Depressed  Affect:  Flat  Thought Process:  Disorganized and Descriptions of Associations: Loose  Orientation:  Full (Time, Place, and Person)  Thought Content:  Paranoid Ideation  Suicidal Thoughts:  No  Homicidal Thoughts:  No  Memory:  Immediate;   Fair Recent;   Fair  Judgement:  Impaired  Insight:  Lacking  Psychomotor  Activity:  Decreased  Concentration: Concentration: Poor and Attention Span: Poor  Recall:  [place]: Fair  Akathisia:  Negative  Handed:  Right  AIMS (if indicated):     Assets:  Desire for Improvement Financial Resources/Insurance Housing Leisure Time Physical Health  Sleep:       Musculoskeletal: Strength & Muscle Tone: within normal limits Gait & Station: normal Patient leans: Backward  Blood pressure (!) 145/103, pulse (!) 135, temperature 98.3 F (36.8 C), temperature source Oral, resp. rate 18, SpO2 99 %, unknown if currently breastfeeding.  Recommendations:  Based on my evaluation the patient does not appear to have an emergency medical condition.  Rozetta Nunnery, NP 10/17/2018, 11:36 PM

## 2018-10-18 ENCOUNTER — Encounter (HOSPITAL_COMMUNITY): Payer: Self-pay | Admitting: Registered Nurse

## 2018-10-18 ENCOUNTER — Other Ambulatory Visit: Payer: Self-pay

## 2018-10-18 DIAGNOSIS — R41843 Psychomotor deficit: Secondary | ICD-10-CM | POA: Diagnosis not present

## 2018-10-18 DIAGNOSIS — F200 Paranoid schizophrenia: Secondary | ICD-10-CM | POA: Diagnosis not present

## 2018-10-18 DIAGNOSIS — F202 Catatonic schizophrenia: Secondary | ICD-10-CM | POA: Diagnosis not present

## 2018-10-18 DIAGNOSIS — F329 Major depressive disorder, single episode, unspecified: Secondary | ICD-10-CM | POA: Diagnosis not present

## 2018-10-18 LAB — COMPREHENSIVE METABOLIC PANEL
ALT: 14 U/L (ref 0–44)
AST: 14 U/L — ABNORMAL LOW (ref 15–41)
Albumin: 3.5 g/dL (ref 3.5–5.0)
Alkaline Phosphatase: 157 U/L — ABNORMAL HIGH (ref 38–126)
Anion gap: 9 (ref 5–15)
BUN: 11 mg/dL (ref 6–20)
CO2: 25 mmol/L (ref 22–32)
Calcium: 9 mg/dL (ref 8.9–10.3)
Chloride: 105 mmol/L (ref 98–111)
Creatinine, Ser: 0.66 mg/dL (ref 0.44–1.00)
GFR calc Af Amer: >60 mL/min (ref >60)
GFR calc non Af Amer: >60 mL/min (ref >60)
Glucose, Bld: 118 mg/dL — ABNORMAL HIGH (ref 70–99)
Potassium: 4.1 mmol/L (ref 3.5–5.1)
Sodium: 139 mmol/L (ref 135–145)
Total Bilirubin: 0.2 mg/dL — ABNORMAL LOW (ref 0.3–1.2)
Total Protein: 7.5 g/dL (ref 6.5–8.1)

## 2018-10-18 LAB — LIPID PANEL
Cholesterol: 136 mg/dL (ref 0–200)
HDL: 41 mg/dL (ref >40)
Total CHOL/HDL Ratio: 3.3 RATIO
Triglycerides: 68 mg/dL (ref <150)
VLDL: 14 mg/dL (ref 0–40)

## 2018-10-18 LAB — HEMOGLOBIN A1C
Hgb A1c MFr Bld: 6.2 % — ABNORMAL HIGH (ref 4.8–5.6)
Mean Plasma Glucose: 131.24 mg/dL

## 2018-10-18 LAB — RAPID URINE DRUG SCREEN, HOSP PERFORMED
Amphetamines: NOT DETECTED
Barbiturates: NOT DETECTED
Benzodiazepines: NOT DETECTED
Cocaine: NOT DETECTED
Opiates: NOT DETECTED
Tetrahydrocannabinol: NOT DETECTED

## 2018-10-18 LAB — CBC
HCT: 36.1 % (ref 36.0–46.0)
Hemoglobin: 11.3 g/dL — ABNORMAL LOW (ref 12.0–15.0)
MCH: 26.4 pg (ref 26.0–34.0)
MCHC: 31.3 g/dL (ref 30.0–36.0)
MCV: 84.3 fL (ref 80.0–100.0)
Platelets: 472 10*3/uL — ABNORMAL HIGH (ref 150–400)
RBC: 4.28 MIL/uL (ref 3.87–5.11)
RDW: 17.2 % — ABNORMAL HIGH (ref 11.5–15.5)
WBC: 12.7 10*3/uL — ABNORMAL HIGH (ref 4.0–10.5)
nRBC: 0 % (ref 0.0–0.2)

## 2018-10-18 LAB — TSH: TSH: 3.592 u[IU]/mL (ref 0.350–4.500)

## 2018-10-18 LAB — PREGNANCY, URINE: Preg Test, Ur: NEGATIVE

## 2018-10-18 MED ORDER — LORAZEPAM 2 MG/ML IJ SOLN
INTRAMUSCULAR | Status: AC
  Administered 2018-10-18: 2 mg via INTRAMUSCULAR
  Filled 2018-10-18: qty 1

## 2018-10-18 MED ORDER — DIPHENHYDRAMINE HCL 50 MG/ML IJ SOLN
50.0000 mg | Freq: Once | INTRAMUSCULAR | Status: AC
  Administered 2018-10-18: 50 mg via INTRAMUSCULAR
  Filled 2018-10-18: qty 1

## 2018-10-18 MED ORDER — DIPHENHYDRAMINE HCL 50 MG/ML IJ SOLN
50.0000 mg | Freq: Once | INTRAMUSCULAR | Status: AC
  Administered 2018-10-18: 50 mg via INTRAMUSCULAR

## 2018-10-18 MED ORDER — TRAZODONE HCL 50 MG PO TABS
50.0000 mg | ORAL_TABLET | Freq: Once | ORAL | Status: AC
  Administered 2018-10-18: 01:00:00 50 mg via ORAL
  Filled 2018-10-18: qty 1

## 2018-10-18 MED ORDER — LORAZEPAM 2 MG/ML IJ SOLN
2.0000 mg | Freq: Once | INTRAMUSCULAR | Status: AC
  Administered 2018-10-18: 2 mg via INTRAMUSCULAR
  Filled 2018-10-18: qty 1

## 2018-10-18 MED ORDER — LORAZEPAM 2 MG/ML IJ SOLN
2.0000 mg | Freq: Once | INTRAMUSCULAR | Status: AC
  Administered 2018-10-18: 2 mg via INTRAMUSCULAR

## 2018-10-18 MED ORDER — ZIPRASIDONE MESYLATE 20 MG IM SOLR
10.0000 mg | Freq: Once | INTRAMUSCULAR | Status: AC
  Administered 2018-10-18: 10 mg via INTRAMUSCULAR

## 2018-10-18 MED ORDER — NICOTINE POLACRILEX 2 MG MT GUM
2.0000 mg | CHEWING_GUM | OROMUCOSAL | Status: DC | PRN
  Administered 2018-10-18: 2 mg via ORAL
  Filled 2018-10-18: qty 1

## 2018-10-18 MED ORDER — ZIPRASIDONE MESYLATE 20 MG IM SOLR
INTRAMUSCULAR | Status: AC
  Administered 2018-10-18: 10 mg via INTRAMUSCULAR
  Filled 2018-10-18: qty 20

## 2018-10-18 MED ORDER — DIPHENHYDRAMINE HCL 50 MG/ML IJ SOLN
INTRAMUSCULAR | Status: AC
  Administered 2018-10-18: 50 mg via INTRAMUSCULAR
  Filled 2018-10-18: qty 1

## 2018-10-18 MED ORDER — ZIPRASIDONE MESYLATE 20 MG IM SOLR
10.0000 mg | Freq: Once | INTRAMUSCULAR | Status: AC
  Administered 2018-10-18: 10 mg via INTRAMUSCULAR
  Filled 2018-10-18: qty 20

## 2018-10-18 NOTE — H&P (Addendum)
BH Observation Unit Provider Admission PAA/H&P  Patient Identification: Molly [name] MRN:  [id number] Date of Evaluation:  10/18/2018 Chief Complaint:  schizoaffective  Principal Diagnosis: Schizophrenia (HCC) Diagnosis:  Active Problems:   Schizophrenia (HCC)  History of Present Illness:   TTS assessment: Molly FreshJessica S Sze is a 33 y.o. female [facility] due to experiencing feelings of paranoia. Pt shared she has been paranoid, feeling as if shadows, spirits, and demons have been following her around since she was little. Pt shares she has been taking medications, including Clozaril and Depakote, but that they have not been working as well as they could be. Pt denies SI, and previous SI, any prior attempts to kill herself, or any prior hospitalizations. Pt denied HI, AVH, NSSIB, access to guns/weapons, involvement in the legal system, and SA.  Pt shares she lives independently; she shares she has no family. Pt states she is not married and that she was [facility] by a Emergency planning/management officerpolice officer.  Pt has a legal guardian; clinician left a HIPAA-compliant voicemail message requesting the DSS worker return my call at her earliest convenience to ensure the worker can be informed of pt's admission into the hospital.   On evaluation patient is alert and oriented x 4, pleasant, and cooperative. Speech is clear and coherent, but pressured. States "I have a psychiatric problem and I need help." Reports mood as depressed. Affect is flat. Thought process is disorganized.  Reports that she feels that demons and spirits are after her. She asked this Clinical research associatewriter to read a letter that she wrote to Guamsiah, a musician. The letter appears benign, it is brief and basically states that she is in love with him. She asked that I mail it to him. This Clinical research associatewriter told her that I could not mail it, so she asked for the letter back. Denies suicidal ideations. Denies homicidal ideations. Denies substance  abuse. Denies audiovisual hallucinations. No indication that patient is responding to internal stimuli.    Associated Signs/Symptoms: Depression Symptoms:  depressed mood, insomnia, psychomotor retardation, feelings of worthlessness/guilt, (Hypo) Manic Symptoms:  Delusions, Labiality of Mood, Anxiety Symptoms:  denies Psychotic Symptoms:  Delusions, Paranoia, PTSD Symptoms: Negative Total Time spent with patient: 30 minutes  Past Psychiatric History: Schizophrenia   Is the patient at risk to self? No.  Has the patient been a risk to self in the past 6 months? No.  Has the patient been a risk to self within the distant past? No.  Is the patient a risk to others? No.  Has the patient been a risk to others in the past 6 months? No.  Has the patient been a risk to others within the distant past? No.   Prior Inpatient Therapy: Prior Inpatient Therapy: (UTA) Prior Outpatient Therapy: Prior Outpatient Therapy: (UTA)  Alcohol Screening: 1. How often do you have a drink containing alcohol?: Never 2. How many drinks containing alcohol do you have on a typical day when you are drinking?: 1 or 2 3. How often do you have six or more drinks on one occasion?: Never AUDIT-C Score: 0 9. Have you or someone else been injured as a result of your drinking?: No 10. Has a relative or friend or a doctor or another health worker been concerned about your drinking or suggested you cut down?: No Alcohol Use Disorder Identification Test Final Score (AUDIT): 0 Alcohol Brief Interventions/Follow-up: AUDIT Score <7 follow-up not indicated Substance Abuse History in the last 12  months:  Yes.   Consequences of Substance Abuse: Negative Previous Psychotropic Medications: Yes  Psychological Evaluations: Yes  Past Medical History:  Past Medical History:  Diagnosis Date  . Abnormal Pap smear   . ASC-cannot exclude HGSIL on Pap 02/15/2012   ASC-US on 02/03/2012 pap (associated Trichomonas infection). No  reflex HPV testing performed on specimen.  Patient informed that she will need repeat Pap in one year.      . Asthma   . ATTENTION DEFICIT, W/O HYPERACTIVITY, History of 06/30/2006   Qualifier: History of  By: McDiarmid MD, Sherren Mocha    . CERVICITIS, GONOCOCCAL, History of 01/09/2007   Qualifier: History of  By: McDiarmid MD, Sherren Mocha    . 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 Depression   . Diabetes mellitus    diet controlled  . Eczema   . Hypertension   . Overactive bladder   . Schizophrenia ([place])   . 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 Henrene Dodge (Psych) At [facility] in  [place], [place]. Qualifier: Hospitalized for  By: McDiarmid MD, Sherren Mocha    . 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 TOBACCO USER 02/08/2009   Qualifier: Diagnosis of  By: Samara Snide      Past Surgical History:  Procedure Laterality Date  . INCISION AND DRAINAGE     pilanodal cyst  . TOOTH EXTRACTION N/A 10/07/2017   Procedure: EXTRACTION TEETH NUMBERS ONE, SEVENTEEN, NINETEEN AND THIRTY TWO;  Surgeon: Diona Browner, DDS;  Location: [place];  Service: Oral Surgery;  Laterality: N/A;   Family History:  Family History  Adopted: Yes  Problem Relation Age of Onset  . Bipolar disorder Sister   . Alcohol abuse Brother   . Cancer Father   . Diabetes Mother    Family Psychiatric History: no pertinent history. Tobacco Screening: Have you used any form of tobacco in the last 30 days? (Cigarettes, Smokeless Tobacco, Cigars, and/or Pipes): Yes Tobacco use, Select all that apply: 4 or less cigarettes per day Are you interested in Tobacco Cessation Medications?: No, patient refused Counseled patient on smoking cessation including recognizing danger situations, developing coping skills and basic information about quitting provided: Refused/Declined practical  counseling Social History:  Social History   Substance and Sexual Activity  Alcohol Use No   Comment: occ     Social History   Substance and Sexual Activity  Drug Use No    Additional Social History: Marital status: Single    Pain Medications: Please see MAR Prescriptions: Please see MAR Over the Counter: Please see MAR History of alcohol / drug use?: No history of alcohol / drug abuse Longest period of sobriety (when/how long): Pt denies use of substances                    Allergies:   Allergies  Allergen Reactions  . Abilify [Aripiprazole] Other (See Comments)    Thinks it's nasty- does not want it.  Injection is ok.     Lab Results: No results found for this or any previous visit (from the past 48 hour(s)).  Blood Alcohol level:  Lab Results  Component Value Date   [facility] <5 05/27/2016   ETH <5 27/61/4709    Metabolic Disorder Labs:  Lab Results  Component Value Date   HGBA1C 6.7 (H) 10/07/2017  MPG 145.59 10/07/2017   MPG 131 (H) 02/08/2014   Lab Results  Component Value Date   PROLACTIN 43.4 02/08/2014   Lab Results  Component Value Date   CHOL 107 02/08/2014   TRIG 77 02/08/2014   HDL 39 (L) 02/08/2014   CHOLHDL 2.7 02/08/2014   VLDL 15 02/08/2014   LDLCALC 53 02/08/2014   LDLCALC 85 09/16/2013    Current Medications: Current Facility-Administered Medications  Medication Dose Route Frequency Provider Last Rate Last Dose  . acetaminophen (TYLENOL) tablet 650 mg  650 mg Oral Q6H PRN Jackelyn PolingBerry, Jason A, NP      . alum & mag hydroxide-simeth (MAALOX/MYLANTA) 200-200-20 MG/5ML suspension 30 mL  30 mL Oral Q4H PRN Nira ConnBerry, Jason A, NP      . hydrOXYzine (ATARAX/VISTARIL) tablet 25 mg  25 mg Oral TID PRN Nira ConnBerry, Jason A, NP      . magnesium hydroxide (MILK OF MAGNESIA) suspension 30 mL  30 mL Oral Daily PRN Jackelyn PolingBerry, Jason A, NP       PTA Medications: Medications Prior to Admission  Medication Sig Dispense Refill Last Dose  . albuterol (PROVENTIL  HFA;VENTOLIN HFA) 108 (90 Base) MCG/ACT inhaler Inhale 1 puff into the lungs every 6 (six) hours as needed for wheezing or shortness of breath. 1 Inhaler 0   . benzonatate (TESSALON) 200 MG capsule Take 1 capsule (200 mg total) by mouth 2 (two) times daily as needed for cough. 20 capsule 0   . chlorhexidine (HIBICLENS) 4 % external liquid Apply 1 application topically 2 (two) times daily as needed (per [facility]).     . Cholecalciferol (VITAMIN D) 2000 units CAPS Take 2,000 Units by mouth daily.      . clindamycin (CLEOCIN T) 1 % external solution Apply 1 application topically 2 (two) times daily.     . cloZAPine (CLOZARIL) 100 MG tablet Take 3 tablets (300 mg total) by mouth 2 (two) times daily. (Patient taking differently: Take 300 mg by mouth at bedtime. ) 60 tablet 0   . divalproex (DEPAKOTE ER) 500 MG 24 hr tablet Take 1,500 mg by mouth daily.     . folic acid (FOLVITE) 1 MG tablet Take 1 mg by mouth daily.     Marland Kitchen. HYDROcodone-acetaminophen (NORCO/VICODIN) 5-325 MG tablet Take 1 tablet by mouth every 4 (four) hours as needed for moderate pain. 10 tablet 0   . medroxyPROGESTERone (DEPO-PROVERA) 150 MG/ML injection Inject 150 mg into the muscle every 3 (three) months.     . metoprolol succinate (TOPROL-XL) 50 MG 24 hr tablet Take 1 tablet (50 mg total) by mouth daily. Take with or immediately following a meal. 30 tablet 0   . mirabegron ER (MYRBETRIQ) 25 MG TB24 tablet Take 25 mg by mouth daily.     . mupirocin ointment (BACTROBAN) 2 % Place 1 application into the nose 2 (two) times daily. 22 g 0   . nicotine (NICODERM CQ - DOSED IN MG/24 HR) 7 mg/24hr patch Place 7 mg onto the skin daily.     . paliperidone (INVEGA SUSTENNA) 234 MG/1.5ML SUSY injection Inject 234 mg into the muscle every 30 (thirty) days.      . predniSONE (DELTASONE) 20 MG tablet Take 1 tablet (20 mg total) by mouth 2 (two) times daily with a meal. 10 tablet 0     Musculoskeletal: Strength & Muscle Tone: within normal limits Gait &  Station: normal Patient leans: Backward  Psychiatric Specialty Exam: Physical Exam  Constitutional: She is oriented to  person, place, and time. She appears well-developed and well-nourished. No distress.  HENT:  Head: Normocephalic and atraumatic.  Right Ear: External ear normal.  Left Ear: External ear normal.  Eyes: Pupils are equal, round, and reactive to light. Conjunctivae are normal. Right eye exhibits no discharge. Left eye exhibits no discharge. No scleral icterus.  Respiratory: Effort normal. No respiratory distress.  Musculoskeletal: Normal range of motion.  Neurological: She is alert and oriented to person, place, and time.  Skin: She is not diaphoretic.    Review of Systems  Constitutional: Negative for chills, diaphoresis, fever, malaise/fatigue and weight loss.  Respiratory: Negative for cough and shortness of breath.   Cardiovascular: Negative for chest pain.  Gastrointestinal: Negative for diarrhea, nausea and vomiting.  Psychiatric/Behavioral: Positive for depression. Negative for hallucinations, memory loss, substance abuse and suicidal ideas. The patient is nervous/anxious and has insomnia.     Blood pressure (!) 145/103, pulse (!) 135, temperature 98.3 F (36.8 C), temperature source Oral, resp. rate 18, SpO2 99 %, unknown if currently breastfeeding.There is no height or weight on file to calculate BMI.  General Appearance: Casual and Fairly Groomed  Eye Contact:  Good  Speech:  Clear and Coherent and Pressured  Volume:  Normal  Mood:  Anxious and Depressed  Affect:  Flat  Thought Process:  Disorganized and Descriptions of Associations: Loose  Orientation:  Full (Time, Place, and Person)  Thought Content:  Paranoid Ideation  Suicidal Thoughts:  No  Homicidal Thoughts:  No  Memory:  Immediate;   Fair Recent;   Fair  Judgement:  Impaired  Insight:  Lacking  Psychomotor Activity:  Decreased  Concentration: Concentration: Poor and Attention Span: Poor   Recall:  [employer]Fair  Fund of Knowledge:Fair  Language: Fair  Akathisia:  Negative  Handed:  Right  AIMS (if indicated):     Assets:  Desire for Improvement Financial Resources/Insurance Housing Leisure Time Physical Health  Sleep:         Treatment Plan Summary: Daily contact with patient to assess and evaluate symptoms and progress in treatment  Observation Level/Precautions:  15 minute checks Laboratory:  CBC Chemistry Profile HbAIC HCG UDS  Lipids TSH Psychotherapy:  Individual Medications:   Patient reports that she takes Clozaril, Depakote, and Folic Acid. She is not sure of the dosages. Medications will need to be verified and reconciled by pharmacy in the morning. Consultations:   Discharge Concerns:  Patient has ACT team Estimated LOS: at this time recommend inpatient treatment. Other:      Jackelyn PolingJason A Berry, NP 6/17/20203:21 AM   Patient's chart reviewed. Reviewed the information documented and agree with the treatment plan.  Juanetta BeetsJacqueline Ancel Easler, DO 10/18/18 4:52 PM

## 2018-10-18 NOTE — BH Assessment (Addendum)
[facility] Assessment Progress Note  Per Buford Dresser, DO, this pt does not require psychiatric hospitalization at this time.  Pt is to be discharged from the [facility] Observation Unit with recommendation to continue treatment with the [facility] ACT Team.  This has been included in pt's discharge instructions.  At 10:41 this Probation officer called the [facility] ACT Team and spoke to [place], notifying him of pt's disposition.  At 10:44 I called pt's legal guardian with [facility], [name] [phone] notifying her as well.  She reports that pt lives independently in the community with ACT Team support, and that she usually uses [employer] for her transportation needs.  Pt's nurse, Edd Arbour, has been notified.  Jalene Mullet, MA Triage Specialist [phone]   Addendum:  At the request of Shuvon Rankin, FNP, this writer placed a second call to [facility] to ask if they could come to the Observation Unit and see if pt is at baseline.  I once again spoke to [place].  He reports that they will not be able to see pt today, but notes that they have been seeing pt on a daily basis in the community of late.  He feels that pt is at baseline and that she almost certainly is presenting at the hospital for secondary gain.  She reportedly has recently been trying to change residences.  These details have been staffed with Shuvon.  Pt is to be discharged from the Observation Unit.  Edd Arbour has been notified.  Jalene Mullet, [place] Coordinator [phone]

## 2018-10-18 NOTE — Discharge Instructions (Signed)
For your behavioral health needs, you are advised to continue treatment with the [facility] ACT Team: ° °     [facility] ACT Team °     [facility], [address] °     [address] °     [place], [place]  [address] °     [phone] °     Crisis number: [phone] °

## 2018-10-18 NOTE — Discharge Summary (Addendum)
[facility]  10/18/2018 10:49 AM Molly [name]  MRN:  [id number] Principal Problem: Schizophrenia [facility](HCC) Discharge Diagnoses: Principal Problem:   Schizophrenia (HCC)   Subjective: Patient reports that she came to the hospital because she was paranoid of the shadows. Patient denied hearing voices at this time.  Patient then states that she came to the hospital because she needed a new apartment related to having roaches.   Patient seen face to face by this provider and Dr. Sharma CovertNorman; chart reviewed on 10/18/18.  On evaluation Molly FreshJessica S [name] reports that she needs a new apartment because she has roaches.  States that her ACTT team is aware that she wants another place.  Patient reports that she sees shadows but denies auditory hallucinations.  Patient was able to inform what medications she was taking; and that she last saw her ACTT team on Monday of this week (2 days ago).  Patient states that she lives alone but doesn't like it there.  Patient informed that we would contact her ACT team to see if could help with finding another place; patient then states that she is hearing mumbling.  Patient denies suicidal/self-harm/homicidal ideation.  Patient states that she has some paranoia and is hearing mumbling.      During evaluation Molly FreshJessica S [name] is laying in bed; she is alert/oriented x 4; calm/cooperative; and mood congruent with affect.  Patient is speaking in a clear tone at moderate volume, and normal pace; with good eye contact.  Her thought process is coherent and relevant; There is no indication that she is currently responding to internal/external stimuli or experiencing delusional thought content; although she states she is paranoid and hearing mumbling.  Patient denies suicidal/self-harm/homicidal ideation.  Patient has remained calm throughout assessment and has answered questions appropriately.  Patient medicated earlier this morning related to telling the staff that she wanted  medication and getting up set because she had not received medication, yelling at staff and slamming doors.      Total Time spent with patient: 30 minutes  Past Psychiatric History: Schizoaffective disorder.  [facility]   Past Medical History:  Past Medical History:  Diagnosis Date  . Abnormal Pap smear   . ASC-cannot exclude HGSIL on Pap 02/15/2012   ASC-US on 02/03/2012 pap (associated Trichomonas infection). No reflex HPV testing performed on specimen.  Patient informed that she will need repeat Pap in one year.      . Asthma   . ATTENTION DEFICIT, W/O HYPERACTIVITY, History of 06/30/2006   Qualifier: History of  By: McDiarmid MD, Tawanna Coolerodd    . CERVICITIS, GONOCOCCAL, History of 01/09/2007   Qualifier: History of  By: McDiarmid MD, Tawanna Coolerodd    . 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 Depression   . Diabetes mellitus    diet controlled  . Eczema   . Hypertension   . Overactive bladder   . Schizophrenia (HCC)   . 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 Dennie Bibleichard Larsen (Psych) At [facility] [place], [place]. Qualifier: Hospitalized for  By: McDiarmid MD, Tawanna Coolerodd    . 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 TOBACCO USER 02/08/2009   Qualifier: Diagnosis of  By: Knox Royaltydell, Erin      Past Surgical History:  Procedure Laterality Date  . INCISION  AND DRAINAGE     pilanodal cyst  . TOOTH EXTRACTION N/A 10/07/2017   Procedure: EXTRACTION TEETH NUMBERS ONE, SEVENTEEN, NINETEEN AND THIRTY TWO;  Surgeon: Ocie DoyneJensen, Scott, DDS;  Location: [facility] OR;  Service: Oral Surgery;  Laterality: N/A;   Family History:  Family History  Adopted: Yes  Problem Relation Age of Onset  . Bipolar disorder Sister   . Alcohol abuse Brother   . Cancer Father   . Diabetes Mother    Family Psychiatric  History: As listed  above.   Social History:  Social History   Substance and Sexual Activity  Alcohol Use No   Comment: occ     Social History   Substance and Sexual Activity  Drug Use No    Social History   Socioeconomic History  . Marital status: Single    Spouse name: Not on file  . Number of children: Not on file  . Years of education: Not on file  . Highest education level: Not on file  Occupational History  . Not on file  Social Needs  . Financial resource strain: Not on file  . Food insecurity    Worry: Not on file    Inability: Not on file  . Transportation needs    Medical: Not on file    Non-medical: Not on file  Tobacco Use  . Smoking status: Former Smoker    Packs/day: 0.50    Years: 10.00    Pack years: 5.00    Types: Cigarettes  . Smokeless tobacco: Never Used  Substance and Sexual Activity  . Alcohol use: No    Comment: occ  . Drug use: No  . Sexual activity: Yes    Birth control/protection: Injection  Lifestyle  . Physical activity    Days per week: Not on file    Minutes per session: Not on file  . Stress: Not on file  Relationships  . Social Musicianconnections    Talks on phone: Not on file    Gets together: Not on file    Attends religious service: Not on file    Active member of club or organization: Not on file    Attends meetings of clubs or organizations: Not on file    Relationship status: Not on file  Other Topics Concern  . Not on file  Social History Narrative   Adopted   Has Guardian   [place] Molly [name]   Transportation: Bus    Has this patient used any form of tobacco in the last 30 days? (Cigarettes, Smokeless Tobacco, Cigars, and/or Pipes) Prescription not provided because: Patient does not use tobacco.  Current Medications: Current Facility-Administered Medications  Medication Dose Route Frequency Provider Last Rate Last Dose  . acetaminophen (TYLENOL) tablet 650 mg  650 mg Oral Q6H PRN Jackelyn PolingBerry, Jason A, NP      .  alum & mag hydroxide-simeth (MAALOX/MYLANTA) 200-200-20 MG/5ML suspension 30 mL  30 mL Oral Q4H PRN Nira ConnBerry, Jason A, NP      . hydrOXYzine (ATARAX/VISTARIL) tablet 25 mg  25 mg Oral TID PRN Nira ConnBerry, Jason A, NP      . magnesium hydroxide (MILK OF MAGNESIA) suspension 30 mL  30 mL Oral Daily PRN Nira ConnBerry, Jason A, NP      . nicotine polacrilex (NICORETTE) gum 2 mg  2 mg Oral PRN Cherly BeachNorman, Chanice Brenton J, DO   2 mg at 10/18/18 [id number]   PTA Medications: Medications Prior to Admission  Medication Sig Dispense Refill Last  Dose  . albuterol (PROVENTIL HFA;VENTOLIN HFA) 108 (90 Base) MCG/ACT inhaler Inhale 1 puff into the lungs every 6 (six) hours as needed for wheezing or shortness of breath. 1 Inhaler 0   . cloZAPine (CLOZARIL) 100 MG tablet Take 3 tablets (300 mg total) by mouth 2 (two) times daily. (Patient taking differently: Take 300 mg by mouth at bedtime. ) 60 tablet 0   . divalproex (DEPAKOTE ER) 500 MG 24 hr tablet Take 1,500 mg by mouth daily.     . metoprolol succinate (TOPROL-XL) 50 MG 24 hr tablet Take 1 tablet (50 mg total) by mouth daily. Take with or immediately following a meal. 30 tablet 0   . paliperidone (INVEGA SUSTENNA) 234 MG/1.5ML SUSY injection Inject 234 mg into the muscle every 30 (thirty) days.        Musculoskeletal: Strength & Muscle Tone: within normal limits Gait & Station: normal Patient leans: N/A  Psychiatric Specialty Exam: Physical Exam  Nursing note and vitals reviewed. Constitutional: She is oriented to person, place, and time. She appears well-developed and well-nourished. No distress.  Neck: Normal range of motion.  Respiratory: Effort normal.  Neurological: She is alert and oriented to person, place, and time.  Skin: Skin is warm and dry.  Psychiatric: She has a normal mood and affect. Judgment and thought content normal. Cognition and memory are normal.    Review of Systems  Psychiatric/Behavioral: Hallucinations: Patient reports hearing mumbling and seeing  shadows; Substance abuse: Denies. Suicidal ideas: Stable.  All other systems reviewed and are negative.   Blood pressure 128/74, pulse (!) 124, temperature 98.2 F (36.8 C), temperature source Oral, resp. rate 18, SpO2 99 %, unknown if currently breastfeeding.There is no height or weight on file to calculate BMI.  General Appearance: Casual  Eye Contact:  Good  Speech:  Blocked and Normal Rate  Volume:  Normal  Mood:  Appropriate  Affect:  Appropriate and Congruent  Thought Process:  Coherent and Linear  Orientation:  Full (Time, Place, and Person)  Thought Content:  WDL and Reports paranoia and auditory hallucinations; but does not apperar to be responding to internal or external stimuli.  Presenting relaxed , coherent, and responding appropriately  Suicidal Thoughts:  No  Homicidal Thoughts:  No  Memory:  Immediate;   Good Recent;   Good Remote;   Good  Judgement:  Intact  Insight:  Present  Psychomotor Activity:  Normal  Concentration:  Concentration: Good  Recall:  Good  Fund of Knowledge:  Fair  Language:  Good  Akathisia:  No  Handed:  Right  AIMS (if indicated):   N/A  Assets:  Manufacturing systems engineerCommunication Skills Housing Leisure Time Social Support  ADL's:  Intact  Cognition:  WNL  Sleep:   N/A     Demographic Factors:  NA  Loss Factors: NA and Denies  Historical Factors: Denies  Risk Reduction Factors:   Religious beliefs about death and Positive therapeutic relationship  Continued Clinical Symptoms:  Previous Psychiatric Diagnoses and Treatments  Cognitive Features That Contribute To Risk:  None    Suicide Risk:  Minimal: No identifiable suicidal ideation.  Patients presenting with no risk factors but with morbid ruminations; may be classified as minimal risk based on the severity of the depressive symptoms    Plan Of Care/Follow-up recommendations:  Activity:  As tolereated Diet:  Heart healthy Other:  Follow up with ACTT and current psychiatric  provider  Disposition: No evidence of imminent risk to self or others at present.  Patient does not meet criteria for psychiatric inpatient admission. Supportive therapy provided about ongoing stressors. Discussed crisis plan, support from social network, calling 911, coming to the Emergency Department, and calling Suicide Hotline.    Earleen Newport, NP   Patient seen face-to-face for psychiatric evaluation, chart reviewed and case discussed with the physician extender and developed treatment plan. Reviewed the information documented and agree with the treatment plan.  Buford Dresser, DO 10/18/18 5:11 PM    10/18/2018, 10:49 AM

## 2018-10-18 NOTE — Progress Notes (Signed)
Molly [name] has been becoming more agitated.  She has taken multiple showers, banging doors, crying intermittently, threatened staff for coming into her room and slammed the door in staff's face.  She admitted that she is hearing voices outside her window and is becoming more paranoid.  Notified Lambert PA of increasing behaviors and orders were obtained for IM medications.  Injections were given and she willingly took them without difficulty.  We will continue to monitor her progress.

## 2018-10-18 NOTE — Progress Notes (Signed)
Molly [name] is a 33 year old female being admitted voluntarily to [facility] Obs unit room 204.  She was brought in by [facility] for "psychiatric problems."  During [facility] Obs admission, she was pleasant and cooperative but impulsive and intrusive.  She denied SI/HI.  She reported seeing demons, shadows and feeling paranoid.  She denied auditory hallucinations.  Oriented her to the unit.  BH-OBS paperwork completed and signed.  Belongings secured in tamper resistant bag and placed in locker # 28.  No contraband found.  Skin assessment completed and noted open area on her left upper breast, near sternum.  Healing without redness or drainage.  Q 15 minute checks initiated for safety.

## 2018-10-18 NOTE — Plan of Care (Signed)
[facility] [facility] Observation Crisis Plan  Reason for Crisis Plan:  Chronic Mental Illness/Medical Illness and Crisis Stabilization   Plan of Care:  Referral for Telepsychiatry/Psychiatric Consult  Family Support:      Current Living Environment:  Living Arrangements: Alone  Insurance:   Hospital Account    Name Acct ID Class Status Primary Coverage   Molly [name], Molly [name] [id number] [place] - MEDICARE PART A AND B        Guarantor Account (for Hospital Account [id number])    Name Relation to Pt Service Area Active? Acct Type   Molly [name], Molly [name]   Address Phone       [address] [place], [place] [address] [phone])          Coverage Information (for Hospital Account [id number])    F/O Payor/Plan Precert #   MEDICARE/MEDICARE PART A AND B    Subscriber Subscriber #   Molly [name], Molly [name] [id number]   Address Phone   [address] [place], [place] [address]       Legal Guardian:  Legal Guardian: Other:(Molly [name])  Primary Care Provider:  System, Pcp Not In  Current Outpatient Providers:  Psychotherapeutic Services  Psychiatrist:  Name of Psychiatrist: Unknown  Counselor/Therapist:  Name of Therapist: Unknown  Compliant with Medications:  No  Additional Information:   Molly [name] 6/17/202012:06 AM

## 2018-10-24 ENCOUNTER — Ambulatory Visit (HOSPITAL_COMMUNITY)
Admission: RE | Admit: 2018-10-24 | Discharge: 2018-10-24 | Disposition: A | Discharge status: Home / Self Care | Payer: Medicare Other | Attending: Psychiatry | Admitting: Psychiatry

## 2018-10-24 DIAGNOSIS — Z87891 Personal history of nicotine dependence: Secondary | ICD-10-CM | POA: Diagnosis not present

## 2018-10-24 DIAGNOSIS — E119 Type 2 diabetes mellitus without complications: Secondary | ICD-10-CM | POA: Insufficient documentation

## 2018-10-24 DIAGNOSIS — Z888 Allergy status to other drugs, medicaments and biological substances status: Secondary | ICD-10-CM | POA: Insufficient documentation

## 2018-10-24 DIAGNOSIS — Z818 Family history of other mental and behavioral disorders: Secondary | ICD-10-CM | POA: Diagnosis not present

## 2018-10-24 DIAGNOSIS — Z809 Family history of malignant neoplasm, unspecified: Secondary | ICD-10-CM | POA: Diagnosis not present

## 2018-10-24 DIAGNOSIS — Z833 Family history of diabetes mellitus: Secondary | ICD-10-CM | POA: Insufficient documentation

## 2018-10-24 DIAGNOSIS — R45851 Suicidal ideations: Secondary | ICD-10-CM | POA: Insufficient documentation

## 2018-10-24 DIAGNOSIS — I10 Essential (primary) hypertension: Secondary | ICD-10-CM | POA: Insufficient documentation

## 2018-10-24 DIAGNOSIS — J45909 Unspecified asthma, uncomplicated: Secondary | ICD-10-CM | POA: Diagnosis not present

## 2018-10-24 DIAGNOSIS — F329 Major depressive disorder, single episode, unspecified: Secondary | ICD-10-CM | POA: Insufficient documentation

## 2018-10-24 DIAGNOSIS — F209 Schizophrenia, unspecified: Secondary | ICD-10-CM | POA: Insufficient documentation

## 2018-10-24 NOTE — Progress Notes (Signed)
Pt was discharged to [place] team member at apprx 2215. Pt requested to be taken to the [facility] because her apartment is "infested with roaches".

## 2018-10-24 NOTE — H&P (Signed)
Behavioral Health Medical Screening Exam  Molly [name] is an 33 y.o. female.  Total Time spent with patient: 20 minutes  Psychiatric Specialty Exam: Physical Exam  Constitutional: She is oriented to person, place, and time. She appears well-developed and well-nourished. No distress.  HENT:  Head: Normocephalic and atraumatic.  Right Ear: External ear normal.  Left Ear: External ear normal.  Eyes: Pupils are equal, round, and reactive to light. Right eye exhibits no discharge. Left eye exhibits no discharge.  Respiratory: Effort normal. No respiratory distress.  Musculoskeletal: Normal range of motion.  Neurological: She is alert and oriented to person, place, and time.  Skin: She is not diaphoretic.  Psychiatric: Her speech is normal. Her affect is blunt. She is not withdrawn and not actively hallucinating. Thought content is paranoid. Thought content is not delusional. She expresses suicidal ideation. She expresses no homicidal ideation. She expresses no suicidal plans.    Review of Systems  Constitutional: Negative for chills, diaphoresis, fever, malaise/fatigue and weight loss.  Respiratory: Negative for cough and shortness of breath.   Cardiovascular: Negative for chest pain.  Gastrointestinal: Negative for diarrhea, nausea and vomiting.  Psychiatric/Behavioral: Positive for depression, hallucinations, substance abuse and suicidal ideas. Negative for memory loss. The patient is nervous/anxious and has insomnia.     Blood pressure (!) 147/97, pulse (!) 134, temperature 98.4 F (36.9 C), temperature source Oral, resp. rate 18, unknown if currently breastfeeding.There is no height or weight on file to calculate BMI.  General Appearance: Casual and Fairly Groomed  Eye Contact:  Good  Speech:  Clear and Coherent and Normal Rate  Volume:  Normal  Mood:  Depressed  Affect:  Blunt and Congruent  Thought Process:  Coherent and Descriptions of Associations: Intact  Orientation:  Full  (Time, Place, and Person)  Thought Content:  Logical and Hallucinations: Auditory  Suicidal Thoughts:  Yes.  without intent/plan  Homicidal Thoughts:  No  Memory:  Immediate;   Fair Recent;   Fair  Judgement:  Fair  Insight:  Fair  Psychomotor Activity:  Normal  Concentration: Concentration: Fair and Attention Span: Fair  Recall:  [employer] of Knowledge:Fair  Language: Fair  Akathisia:  Negative  Handed:  Right  AIMS (if indicated):     Assets:  Communication Skills Desire for Improvement Financial Resources/Insurance Housing Intimacy Leisure Time Physical Health  Sleep:       Musculoskeletal: Strength & Muscle Tone: within normal limits Gait & Station: normal   Blood pressure (!) 147/97, pulse (!) 134, temperature 98.4 F (36.9 C), temperature source Oral, resp. rate 18, unknown if currently breastfeeding.  Recommendations:  Based on my evaluation the patient does not appear to have an emergency medical condition.  No evidence of imminent risk to self or others at present.   Patient does not meet criteria for psychiatric inpatient admission. Discussed crisis plan, support from social network, calling 911, coming to the Emergency Department, and calling Suicide Hotline.    Pt was discharged to [facility] team member at apprx 2215. Pt requested to be taken to the [facility] because her apartment is "infested with roaches".   Rozetta Nunnery, NP 10/24/2018, 11:57 PM

## 2018-10-24 NOTE — BH Assessment (Signed)
Assessment Note  Molly [name] is an 33 y.o. female who [name] for an assessment due to experiencing feelings of SI with no plan and paranoia. Patient reported being paranoid, feeling as if shadows, spirits, and demons have been following her around since she was little. Patient reported taking medications. Patient denied prior attempts to kill herself, or any prior hospitalizations. Patient denied HI, AVH, NSSIB, access to guns/weapons, involvement in the legal system, and SA. Patient asking fundamental questions of clinician regarding personal belongings, etc.   Patient reportedly living independently and has no family. Patient reported not being married and that she was brought [facility] by a Emergency planning/management officerpolice officer at her request. Patient is voluntary.  Pt has a legal guardian; clinician left a HIPAA-compliant voicemail message requesting the DSS worker return my call at her earliest convenience regarding patient care. [name], [place], [phone].  Diagnosis: Schizophrenia  Past Medical History:  Past Medical History:  Diagnosis Date  . Abnormal Pap smear   . ASC-cannot exclude HGSIL on Pap 02/15/2012   ASC-US on 02/03/2012 pap (associated Trichomonas infection). No reflex HPV testing performed on specimen.  Patient informed that she will need repeat Pap in one year.      . Asthma   . ATTENTION DEFICIT, W/O HYPERACTIVITY, History of 06/30/2006   Qualifier: History of  By: McDiarmid MD, Tawanna Coolerodd    . CERVICITIS, GONOCOCCAL, History of 01/09/2007   Qualifier: History of  By: McDiarmid MD, Tawanna Coolerodd    . 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 Depression   . Diabetes mellitus    diet controlled  . Eczema   . Hypertension   . Overactive bladder   . Schizophrenia (HCC)   . 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 Dennie Bibleichard Larsen (Psych) At [facility] in  [place], [place].  Qualifier: Hospitalized for  By: McDiarmid MD, Tawanna Coolerodd    . 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 TOBACCO USER 02/08/2009   Qualifier: Diagnosis of  By: Knox Royaltydell, Erin      Past Surgical History:  Procedure Laterality Date  . INCISION AND DRAINAGE     pilanodal cyst  . TOOTH EXTRACTION N/A 10/07/2017   Procedure: EXTRACTION TEETH NUMBERS ONE, SEVENTEEN, NINETEEN AND THIRTY TWO;  Surgeon: Ocie DoyneJensen, Scott, DDS;  Location: [facility] OR;  Service: Oral Surgery;  Laterality: N/A;    Family History:  Family History  Adopted: Yes  Problem Relation Age of Onset  . Bipolar disorder Sister   . Alcohol abuse Brother   . Cancer Father   . Diabetes Mother     Social History:  reports that she has quit smoking. Her smoking use included cigarettes. She has a 5.00 pack-year smoking history. She has never used smokeless tobacco. She reports that she does not drink alcohol or use drugs.  Additional Social History:  Alcohol / Drug Use Pain Medications: see MAR Prescriptions: see MAR Over the Counter: see MAR  CIWA: CIWA-Ar BP: (!) 147/97 Pulse Rate: (!) 134 COWS:    Allergies:  Allergies  Allergen Reactions  . Abilify [Aripiprazole] Other (See Comments)    Thinks it's nasty- does not want it.  Injection is ok.      Home Medications: (Not in a hospital admission)   OB/GYN Status:  No LMP recorded. (Menstrual status: Irregular  Periods).  General Assessment Data Location of Assessment: [facility] TTS Assessment: In system Is this a Tele or Face-to-Face Assessment?: Face-to-Face Is this an Initial Assessment or a Re-assessment for this encounter?: Initial Assessment Patient Accompanied by:: N/A Language Other than English: No Living Arrangements: Other (Comment)(patient lives independently) What gender do you identify as?: Female Marital status: Single Maiden name: [name]) Living Arrangements: Alone Can pt return to current  living arrangement?: Yes Admission Status: Voluntary Is patient capable of signing voluntary admission?: Yes Referral Source: Self/Family/Friend  Medical Screening Exam [facility] Walk-in ONLY) Medical Exam completed: Yes  Crisis Care Plan Living Arrangements: Alone Legal Guardian: Other:([name] Park[place]) Name of Psychiatrist: Unknown Name of Therapist: Unknown  Education Status Is patient currently in school?: No Is the patient employed, unemployed or receiving disability?: Receiving disability income  Risk to self with the past 6 months Suicidal Ideation: Yes-Currently Present Has patient been a risk to self within the past 6 months prior to admission? : No Suicidal Intent: Yes-Currently Present Has patient had any suicidal intent within the past 6 months prior to admission? : No Is patient at risk for suicide?: Yes Suicidal Plan?: No Has patient had any suicidal plan within the past 6 months prior to admission? : No Access to Means: No What has been your use of drugs/alcohol within the last 12 months?: (denied) Previous Attempts/Gestures: No How many times?: (0) Other Self Harm Risks: (none reported) Triggers for Past Attempts: None known Intentional Self Injurious Behavior: None Family Suicide History: Unknown Recent stressful life event(s): (paranoia) Persecutory voices/beliefs?: No Depression: No Depression Symptoms: Feeling worthless/self pity Substance abuse history and/or treatment for substance abuse?: No Suicide prevention information given to non-admitted patients: Not applicable  Risk to Others within the past 6 months Homicidal Ideation: No Does patient have any lifetime risk of violence toward others beyond the six months prior to admission? : No Thoughts of Harm to Others: No Current Homicidal Intent: No Current Homicidal Plan: No Access to Homicidal Means: No Identified Victim: (none noted) History of harm to others?: No Assessment of Violence: None  Noted Violent Behavior Description: (none reported) Does patient have access to weapons?: No Criminal Charges Pending?: No Does patient have a court date: No Is patient on probation?: No  Psychosis Hallucinations: Auditory Delusions: None noted  Mental Status Report Appearance/Hygiene: Unremarkable Eye Contact: Fair Motor Activity: Freedom of movement Speech: Slow, Other (Comment) Level of Consciousness: Alert Mood: (suspicious) Affect: Appropriate to circumstance Anxiety Level: Moderate Thought Processes: Circumstantial Judgement: Impaired Orientation: Unable to assess Obsessive Compulsive Thoughts/Behaviors: None  Cognitive Functioning Concentration: Unable to Assess Memory: Unable to Assess Is patient IDD: No Insight: Fair Impulse Control: Poor Appetite: (unable to assess) Have you had any weight changes? : (unable to assess) Sleep: Unable to Assess Total Hours of Sleep: (unable to assess) Vegetative Symptoms: Unable to Assess  ADLScreening [facility] Assessment Services) Patient's cognitive ability adequate to safely complete daily activities?: Yes Patient able to express need for assistance with ADLs?: Yes Independently performs ADLs?: Yes (appropriate for developmental age)  Prior Inpatient Therapy Prior Inpatient Therapy: (unable to assess)  Prior Outpatient Therapy Prior Outpatient Therapy: (ACT TEam)  ADL Screening (condition at time of admission) Patient's cognitive ability adequate to safely complete daily activities?: Yes Patient able to express need for assistance with ADLs?: Yes Independently performs ADLs?: Yes (appropriate for developmental age)   Disposition:  Disposition Initial Assessment Completed for this Encounter: Yes  Lindon Romp, NP, patient does not meet inpatient criteria.  Maralyn SagoSarah, SW, coordinated patient to be picked up by ACT Team. Patient discharged to ACT Team.  On Site Evaluation by:   Reviewed with Physician:    Burnetta SabinLatisha D  Zoye Chandra 10/24/2018 9:56 PM

## 2018-10-25 ENCOUNTER — Encounter (HOSPITAL_COMMUNITY): Payer: Self-pay

## 2018-10-25 ENCOUNTER — Emergency Department (HOSPITAL_COMMUNITY)
Admission: EM | Admit: 2018-10-25 | Discharge: 2018-10-25 | Discharge status: Against Medical Advice | Payer: Medicare Other | Attending: Emergency Medicine | Admitting: Emergency Medicine

## 2018-10-25 DIAGNOSIS — Z5321 Procedure and treatment not carried out due to patient leaving prior to being seen by health care provider: Secondary | ICD-10-CM | POA: Diagnosis not present

## 2018-10-25 DIAGNOSIS — M25561 Pain in right knee: Secondary | ICD-10-CM | POA: Diagnosis present

## 2018-10-25 NOTE — ED Notes (Signed)
Pt seen walking out of ED.

## 2018-10-25 NOTE — ED Triage Notes (Signed)
Pt comes via [facility] for R knee pain that has been going on for the past year. Pt ambulatory

## 2018-10-28 ENCOUNTER — Encounter (HOSPITAL_COMMUNITY): Payer: Self-pay

## 2018-10-28 ENCOUNTER — Other Ambulatory Visit: Payer: Self-pay

## 2018-10-28 ENCOUNTER — Emergency Department (HOSPITAL_COMMUNITY)
Admission: EM | Admit: 2018-10-28 | Discharge: 2018-10-29 | Disposition: A | Discharge status: Home / Self Care | Payer: Medicare Other | Attending: Emergency Medicine | Admitting: Emergency Medicine

## 2018-10-28 DIAGNOSIS — F200 Paranoid schizophrenia: Secondary | ICD-10-CM

## 2018-10-28 DIAGNOSIS — L03317 Cellulitis of buttock: Secondary | ICD-10-CM | POA: Diagnosis not present

## 2018-10-28 DIAGNOSIS — F259 Schizoaffective disorder, unspecified: Secondary | ICD-10-CM | POA: Diagnosis present

## 2018-10-28 DIAGNOSIS — E119 Type 2 diabetes mellitus without complications: Secondary | ICD-10-CM | POA: Diagnosis not present

## 2018-10-28 DIAGNOSIS — Z87891 Personal history of nicotine dependence: Secondary | ICD-10-CM | POA: Diagnosis not present

## 2018-10-28 DIAGNOSIS — R440 Auditory hallucinations: Secondary | ICD-10-CM | POA: Diagnosis not present

## 2018-10-28 DIAGNOSIS — J45909 Unspecified asthma, uncomplicated: Secondary | ICD-10-CM | POA: Diagnosis not present

## 2018-10-28 DIAGNOSIS — I10 Essential (primary) hypertension: Secondary | ICD-10-CM | POA: Diagnosis not present

## 2018-10-28 DIAGNOSIS — Z7689 Persons encountering health services in other specified circumstances: Secondary | ICD-10-CM

## 2018-10-28 DIAGNOSIS — Z046 Encounter for general psychiatric examination, requested by authority: Secondary | ICD-10-CM | POA: Diagnosis present

## 2018-10-28 LAB — ACETAMINOPHEN LEVEL: Acetaminophen (Tylenol), Serum: <10 ug/mL — ABNORMAL LOW (ref 10–30)

## 2018-10-28 LAB — CBC WITH DIFFERENTIAL/PLATELET
Abs Immature Granulocytes: 0.09 10*3/uL — ABNORMAL HIGH (ref 0.00–0.07)
Basophils Absolute: 0.1 10*3/uL (ref 0.0–0.1)
Basophils Relative: 1 %
Eosinophils Absolute: 0.1 10*3/uL (ref 0.0–0.5)
Eosinophils Relative: 1 %
HCT: 35.9 % — ABNORMAL LOW (ref 36.0–46.0)
Hemoglobin: 11 g/dL — ABNORMAL LOW (ref 12.0–15.0)
Immature Granulocytes: 1 %
Lymphocytes Relative: 24 %
Lymphs Abs: 3.1 10*3/uL (ref 0.7–4.0)
MCH: 26.4 pg (ref 26.0–34.0)
MCHC: 30.6 g/dL (ref 30.0–36.0)
MCV: 86.3 fL (ref 80.0–100.0)
Monocytes Absolute: 1 10*3/uL (ref 0.1–1.0)
Monocytes Relative: 8 %
Neutro Abs: 8.4 10*3/uL — ABNORMAL HIGH (ref 1.7–7.7)
Neutrophils Relative %: 65 %
Platelets: 481 10*3/uL — ABNORMAL HIGH (ref 150–400)
RBC: 4.16 MIL/uL (ref 3.87–5.11)
RDW: 17.8 % — ABNORMAL HIGH (ref 11.5–15.5)
WBC: 12.9 10*3/uL — ABNORMAL HIGH (ref 4.0–10.5)
nRBC: 0 % (ref 0.0–0.2)

## 2018-10-28 LAB — BASIC METABOLIC PANEL
Anion gap: 12 (ref 5–15)
BUN: 8 mg/dL (ref 6–20)
CO2: 20 mmol/L — ABNORMAL LOW (ref 22–32)
Calcium: 8.2 mg/dL — ABNORMAL LOW (ref 8.9–10.3)
Chloride: 104 mmol/L (ref 98–111)
Creatinine, Ser: 0.76 mg/dL (ref 0.44–1.00)
GFR calc Af Amer: >60 mL/min (ref >60)
GFR calc non Af Amer: >60 mL/min (ref >60)
Glucose, Bld: 234 mg/dL — ABNORMAL HIGH (ref 70–99)
Potassium: 3.7 mmol/L (ref 3.5–5.1)
Sodium: 136 mmol/L (ref 135–145)

## 2018-10-28 LAB — RAPID URINE DRUG SCREEN, HOSP PERFORMED
Amphetamines: NOT DETECTED
Barbiturates: NOT DETECTED
Benzodiazepines: NOT DETECTED
Cocaine: NOT DETECTED
Opiates: NOT DETECTED
Tetrahydrocannabinol: NOT DETECTED

## 2018-10-28 LAB — SALICYLATE LEVEL: Salicylate Lvl: <7 mg/dL (ref 2.8–30.0)

## 2018-10-28 LAB — ETHANOL: Alcohol, Ethyl (B): <10 mg/dL (ref <10)

## 2018-10-28 LAB — POC URINE PREG, ED: Preg Test, Ur: NEGATIVE

## 2018-10-28 MED ORDER — CEPHALEXIN 500 MG PO CAPS
500.0000 mg | ORAL_CAPSULE | Freq: Once | ORAL | Status: AC
  Administered 2018-10-28: 500 mg via ORAL
  Filled 2018-10-28: qty 1

## 2018-10-28 MED ORDER — CEPHALEXIN 500 MG PO CAPS
500.0000 mg | ORAL_CAPSULE | Freq: Two times a day (BID) | ORAL | Status: DC
  Administered 2018-10-28 – 2018-10-29 (×2): 500 mg via ORAL
  Filled 2018-10-28 (×2): qty 1

## 2018-10-28 MED ORDER — CLOZAPINE 100 MG PO TABS
300.0000 mg | ORAL_TABLET | Freq: Two times a day (BID) | ORAL | Status: DC
  Administered 2018-10-28 – 2018-10-29 (×2): 300 mg via ORAL
  Filled 2018-10-28 (×3): qty 3

## 2018-10-28 MED ORDER — NICOTINE POLACRILEX 2 MG MT GUM
2.0000 mg | CHEWING_GUM | OROMUCOSAL | Status: DC | PRN
  Administered 2018-10-28 – 2018-10-29 (×2): 2 mg via ORAL
  Filled 2018-10-28 (×2): qty 1

## 2018-10-28 NOTE — ED Notes (Signed)
Pt ambulatory w/o difficulty to room 31 

## 2018-10-28 NOTE — ED Notes (Signed)
She has been cooperative. She has recently been assisted to take a shower. She ambulates without difficulty.

## 2018-10-28 NOTE — ED Triage Notes (Signed)
Her first statement is that, "I've been walking since Tuesday". She is very verbose, using much paranoid and religious phraseology. She states "Someone though I was [name] and they tried to crucify me and pierce my side", etc., etc. She is ambulatory and in no distress. She also requests pregnancy test and HIV testing and tells [place] her "legs are swollen".

## 2018-10-28 NOTE — ED Notes (Signed)
Bed: IP77 Expected date: 10/28/18 Expected time: 10:12 AM Means of arrival: Ambulance Comments: Not feeling well, thinks she may be pregnant.

## 2018-10-28 NOTE — ED Notes (Signed)
It was mentioned by our tech. Molly [name]) that pt. Has a bleeding area at coccyx area which appears to be from an abscess. Upon further inspection, there is scarring in that area which appears to be old I & D procedure scars. The area is not particularly tender and has minimal erythema. It is perhaps propitious that it is draining.

## 2018-10-28 NOTE — ED Notes (Signed)
Pt. Was quietly writing on her paper and then flipped the table. Saying nothing while doing so. RN in room with her right now

## 2018-10-28 NOTE — ED Notes (Signed)
Pt. Began crying really loudly. Asked if anything was wrong, pt. Yelled for me to get out.

## 2018-10-28 NOTE — ED Provider Notes (Signed)
[facility] DEPT Provider Note   CSN: [id number] Arrival date & time: 10/28/18  1010    History   Chief Complaint Chief Complaint  Patient presents with  . Mental Health Problem    HPI Molly [name] is a 33 y.o. female.     HPI   Patient is a 33 year old female with a history of ADHD, diabetes, hypertension, schizophrenia, who presents the emergency department today initially stating that she does not feel well and she thinks she may be pregnant.  On my assessment patient states, " I am sick, have been walking since Tuesday". She states, "someone thought I was [name] and they tried to crucify me and pierce my side". Pt has flight of ideas and talks about how she is homeless and has been panhandling and how people think that she is a prostitute.   She denies chest pain, cough, fevers, abd pain, NVD, or urinary sxs.  Patient states she drank 1 beer several days ago.  Denies any drug use. Denies SI, HI, AVH. States she ran out of her psych meds.   Past Medical History:  Diagnosis Date  . Abnormal Pap smear   . ASC-cannot exclude HGSIL on Pap 02/15/2012   ASC-US on 02/03/2012 pap (associated Trichomonas infection). No reflex HPV testing performed on specimen.  Patient informed that she will need repeat Pap in one year.      . Asthma   . ATTENTION DEFICIT, W/O HYPERACTIVITY, History of 06/30/2006   Qualifier: History of  By: McDiarmid MD, Sherren Mocha    . CERVICITIS, GONOCOCCAL, History of 01/09/2007   Qualifier: History of  By: McDiarmid MD, Sherren Mocha    . 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 Depression   . Diabetes mellitus    diet controlled  . Eczema   . Hypertension   . Overactive bladder   . Schizophrenia ([place])   . 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 Henrene Dodge (Psych) At [facility] in  [place], [place]. Qualifier: Hospitalized for  By:  McDiarmid MD, Sherren Mocha    . 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 TOBACCO USER 02/08/2009   Qualifier: Diagnosis of  By: Samara Snide      Patient Active Problem List   Diagnosis Date Noted  . Schizophrenia ([place]) 10/17/2018  . Post-operative state 10/07/2017  . Disorganized schizophrenia ([place])   . Psychoses ([place])   . Borderline intellectual functioning   . Elevated WBC count 04/07/2014  . Noncompliance with medication regimen 04/07/2014  . Schizoaffective disorder-chronic with exacerbation ([place]) 02/06/2014  . Aggressive behavior 08/16/2013  . Papular rash, generalized 09/01/2012  . Amenorrhea 04/25/2012  . ASC-cannot exclude HGSIL on Pap 02/15/2012  . Screening for malignant neoplasm of the cervix 02/01/2012  . 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 Obesity, unspecified 02/10/2009  . TOBACCO USER 02/08/2009  . DIABETES MELLITUS 04/02/2008  . CERVICITIS, GONOCOCCAL, History of 01/09/2007  . TRICHOMONAL VAGINITIS 01/09/2007  . ECZEMA, ATOPIC DERMATITIS 06/30/2006    Past Surgical History:  Procedure Laterality Date  . INCISION AND DRAINAGE     pilanodal cyst  . TOOTH EXTRACTION N/A 10/07/2017   Procedure: EXTRACTION TEETH NUMBERS ONE, SEVENTEEN, NINETEEN AND THIRTY TWO;  Surgeon: Diona Browner, DDS;  Location: [facility];  Service: Oral Surgery;  Laterality: N/A;  OB History    Gravida  3   Para  1   Term  1   Preterm      AB      Living  1     SAB      TAB      Ectopic      Multiple      Live Births               Home Medications    Prior to Admission medications   Medication Sig Start Date End Date Taking? Authorizing Provider  albuterol (PROVENTIL HFA;VENTOLIN HFA) 108 (90 Base) MCG/ACT inhaler Inhale 1 puff into the lungs every 6 (six) hours as needed for wheezing or shortness of breath. Patient not taking: Reported on 10/28/2018 03/20/18   Eustace MooreNelson, Yvonne Sue, MD  cloZAPine (CLOZARIL)  100 MG tablet Take 3 tablets (300 mg total) by mouth 2 (two) times daily. Patient not taking: Reported on 10/28/2018 05/29/16   Charm RingsLord, Jamison Y, NP  divalproex (DEPAKOTE ER) 500 MG 24 hr tablet Take 1,500 mg by mouth daily.    [provider]  metoprolol succinate (TOPROL-XL) 50 MG 24 hr tablet Take 1 tablet (50 mg total) by mouth daily. Take with or immediately following a meal. Patient not taking: Reported on 10/28/2018 05/29/16   Charm RingsLord, Jamison Y, NP  paliperidone (INVEGA SUSTENNA) 234 MG/1.5ML SUSY injection Inject 234 mg into the muscle every 30 (thirty) days.     [provider]    Family History Family History  Adopted: Yes  Problem Relation Age of Onset  . Bipolar disorder Sister   . Alcohol abuse Brother   . Cancer Father   . Diabetes Mother     Social History Social History   Tobacco Use  . Smoking status: Former Smoker    Packs/day: 0.50    Years: 10.00    Pack years: 5.00    Types: Cigarettes  . Smokeless tobacco: Never Used  Substance Use Topics  . Alcohol use: No    Comment: occ  . Drug use: No     Allergies   Abilify [aripiprazole]   Review of Systems Review of Systems  Constitutional: Negative for fever.  HENT: Negative for sore throat.   Eyes: Negative for visual disturbance.  Respiratory: Negative for cough and shortness of breath.   Cardiovascular: Negative for chest pain.  Gastrointestinal: Negative for abdominal pain, constipation, diarrhea, nausea and vomiting.  Genitourinary: Negative for dysuria.  Musculoskeletal: Negative for back pain.  Skin: Negative for wound.  Neurological: Negative for headaches.  Psychiatric/Behavioral: Negative for suicidal ideas.       Denies HI or AVH  All other systems reviewed and are negative.    Physical Exam Updated Vital Signs BP (!) 136/94 (BP Location: Right Arm)   Pulse (!) 107   Temp 98.5 F (36.9 C) (Oral)   Resp 16   LMP  (LMP Unknown)   SpO2 99%   Physical Exam Vitals signs  and nursing note reviewed.  Constitutional:      General: She is not in acute distress.    Appearance: She is well-developed.  HENT:     Head: Normocephalic and atraumatic.  Eyes:     Conjunctiva/sclera: Conjunctivae normal.  Neck:     Musculoskeletal: Neck supple.  Cardiovascular:     Rate and Rhythm: Regular rhythm. Tachycardia present.     Heart sounds: No murmur.  Pulmonary:     Effort: Pulmonary effort is normal. No respiratory  distress.     Breath sounds: Normal breath sounds.  Abdominal:     Palpations: Abdomen is soft.     Tenderness: There is no abdominal tenderness.  Skin:    General: Skin is warm and dry.  Neurological:     Mental Status: She is alert.  Psychiatric:        Attention and Perception: She is inattentive.        Mood and Affect: Mood is anxious. Affect is labile.        Speech: Speech is rapid and pressured and tangential.        Behavior: Behavior is hyperactive.        Thought Content: Thought content is paranoid and delusional.        Judgment: Judgment is impulsive.      ED Treatments / Results  Labs (all labs ordered are listed, but only abnormal results are displayed) Labs Reviewed  CBC WITH DIFFERENTIAL/PLATELET - Abnormal; Notable for the following components:      Result Value   WBC 12.9 (*)    Hemoglobin 11.0 (*)    HCT 35.9 (*)    RDW 17.8 (*)    Platelets 481 (*)    Neutro Abs 8.4 (*)    Abs Immature Granulocytes 0.09 (*)    All other components within normal limits  BASIC METABOLIC PANEL - Abnormal; Notable for the following components:   CO2 20 (*)    Glucose, Bld 234 (*)    Calcium 8.2 (*)    All other components within normal limits  ACETAMINOPHEN LEVEL - Abnormal; Notable for the following components:   Acetaminophen (Tylenol), Serum <10 (*)    All other components within normal limits  RAPID URINE DRUG SCREEN, HOSP PERFORMED  ETHANOL  SALICYLATE LEVEL  POC URINE PREG, ED    EKG None  Radiology No results  found.  Procedures Procedures (including critical care time)  Medications Ordered in ED Medications  cephALEXin (KEFLEX) capsule 500 mg (has no administration in time range)  cephALEXin (KEFLEX) capsule 500 mg (500 mg Oral Given 10/28/18 1158)     Initial Impression / Assessment and Plan / ED Course  I have reviewed the triage vital signs and the nursing notes.  Pertinent labs & imaging results that were available during my care of the patient were reviewed by me and considered in my medical decision making (see chart for details).     Final Clinical Impressions(s) / ED Diagnoses   Final diagnoses:  Encounter for psychiatric assessment  Cellulitis of buttock   Pt is a 33 y/o female with a h/o ADHD and schizophrenia who presents to the ED Today initially requesting a pregnancy test. On eval she has pressured speech, flight of ideas, with loose associations. She is having delusions that people think she is [employer].   She has no medical complaints on initial exam. I was later informed by nursing staff that pt has possible abscess to pilonidal area. Pt hesitant to let me evaluate this. Does have 2x2cm area of induration with possible small central area of fluctuance. Also small central area that is partially scabbed and slightly bloody. Exam limited due to pt now allowing full exam to be completed. Discussed the need for possible drainage if abscess present. Pt adamantly declines I&D. Will place on keflex. First dose given.   CBC with mild elevation in white blood cell count at 12.9, this does appear consistent with her prior labs.  Also with thrombocytosis consistent with  prior.  Mild anemia consistent with prior. BMP with slightly low bicarb, consistent with prior.  Elevated blood glucose at 234.  No elevated anion gap.  And no symptoms to suggest DKA. UDS negative U preg negative Tylenol, salicylate levels negative ETOH negative  1:10 PM There was a smell of smoke noted outside  of pts room after she returned from the bathroom. Pt admitted to smoking a black and mild in the bathroom. Pt advised not to do this. Advised nursing staff to have pt changed into paper scrubs and take her things.   [facility]  Patient evaluated by behavioral health.  Observation was recommended.  Patient care transition to default provider at shift change.  ED Discharge Orders    None       Rayne DuCouture, Theador Jezewski S, PA-C 10/28/18 1717    Jacalyn LefevreHaviland, Julie, MD 10/29/18 [phone]

## 2018-10-28 NOTE — ED Notes (Signed)
Pt. Given a dark blue crayon.

## 2018-10-28 NOTE — ED Notes (Signed)
When entering the room Pt was asked if she was able to urinate before undressing her into hospital gown for MD. Pt asked "can I smoke my black in mild". Explained to pt that she was not allowed to smoke in the hospital or on campus. Pt states "well then I don't have to pee until you let me." When trying to help pt undress to get in hospital gown, pt jerked her elbow back and said that she did not want to be forced to do anything. I stated to pt that I was just simply trying to help her. Pt is very aggressive.

## 2018-10-28 NOTE — BH Assessment (Signed)
[facility] Assessment Progress Note  Case was staffed with Davis NP who recommended patient be observed and monitored.      

## 2018-10-28 NOTE — ED Notes (Addendum)
Pt sitting quielty on the bed writing.  When asked why she flipped th table over, she responded because I'm sick of this sh..."

## 2018-10-28 NOTE — BH Assessment (Addendum)
Assessment Note  Molly [name] [name] is an 33 y.o. female with a history of schizophrenia who presents to the Emergency Department via [employer], complaining of auditory hallucinations. She says the voices are saying she is [name]. She reports active S/I although denies any plan at this time. Patient denies any H/I or VH. She denies current medication compliance and is unaware of who is currently managing her medications. Patient states she may be pregnant "with the great one." Patient states she "has been walking for days without rest." Patient renders limited history and displays active flight of ideas at times not responding to most of the questions associated with assessment. Per chart EDP notes on admission that patient stated "she was sick"  and has been "walking since Tuesday". She states, "someone thought I was [name] and they tried to crucify me and pierce my side". Pt has flight of ideas and talks about how she is homeless and has been panhandling and how people think that she is a prostitute. Pt has a legal guardian Molly [name] [name] that this Probation officer attempted to contact unsuccessfully. Thought process is disorganized and continue to voice concerns that demons and spirits are after her. Denies homicidal ideations. Denies substance abuse. Denies visual  hallucinations. Case was staffed with Rosana Hoes NP who recommended patient be observed and monitored.   Diagnosis: F29.0 Schizophrenia   Past Medical History:  Past Medical History:  Diagnosis Date  . Abnormal Pap smear   . ASC-cannot exclude HGSIL on Pap 02/15/2012   ASC-US on 02/03/2012 pap (associated Trichomonas infection). No reflex HPV testing performed on specimen.  Patient informed that she will need repeat Pap in one year.      . Asthma   . ATTENTION DEFICIT, W/O HYPERACTIVITY, History of 06/30/2006   Qualifier: History of  By: McDiarmid MD, Sherren Mocha    . CERVICITIS, GONOCOCCAL, History of 01/09/2007   Qualifier: History of  By: McDiarmid MD, Sherren Mocha     . 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 Depression   . Diabetes mellitus    diet controlled  . Eczema   . Hypertension   . Overactive bladder   . Schizophrenia ([place])   . 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 Henrene Dodge (Psych) At [facility] in  [place], [place]. Qualifier: Hospitalized for  By: McDiarmid MD, Sherren Mocha    . 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 TOBACCO USER 02/08/2009   Qualifier: Diagnosis of  By: Samara Snide      Past Surgical History:  Procedure Laterality Date  . INCISION AND DRAINAGE     pilanodal cyst  . TOOTH EXTRACTION N/A 10/07/2017   Procedure: EXTRACTION TEETH NUMBERS ONE, SEVENTEEN, NINETEEN AND THIRTY TWO;  Surgeon: Diona Browner, DDS;  Location: [facility];  Service: Oral Surgery;  Laterality: N/A;    Family History:  Family History  Adopted: Yes  Problem Relation Age of Onset  . Bipolar disorder Sister   . Alcohol abuse Brother   . Cancer Father   . Diabetes Mother     Social History:  reports that she has quit smoking. Her smoking use included cigarettes. She has a 5.00 pack-year smoking history. She has never used smokeless tobacco. She reports that she does not drink alcohol or use drugs.  Additional Social History:     CIWA: CIWA-Ar  BP: (!) 138/107 Pulse Rate: (!) 130 COWS:    Allergies:  Allergies  Allergen Reactions  . Abilify [Aripiprazole] Other (See Comments)    Thinks it's nasty- does not want it.  Injection is ok.      Home Medications: (Not in a hospital admission)   OB/GYN Status:  No LMP recorded (lmp unknown). (Menstrual status: Irregular Periods).  General Assessment Data [facility] of [facility]: [facility] TTS Assessment: In system Is this a Tele or Face-to-Face Assessment?: Face-to-Face Is this an Initial Assessment or a Re-assessment for this encounter?:  Initial Assessment Patient Accompanied by:: N/A Language Other than English: No Living Arrangements: Other (Comment) What gender do you identify as?: Female Marital status: Single Living Arrangements: Alone Can pt return to current living arrangement?: Yes Admission Status: Voluntary Is patient capable of signing voluntary admission?: Yes Referral Source: Self/Family/Friend Insurance type: Medicare  Medical Screening Exam [facility] Walk-in ONLY) Medical Exam completed: Yes  Crisis Care Plan Living Arrangements: Alone Legal Guardian: Other:(Molly [name] [name]) Name of Psychiatrist: Unknown Name of Therapist: Unknown  Education Status Is patient currently in school?: No Is the patient employed, unemployed or receiving disability?: Receiving disability income  Risk to self with the past 6 months Suicidal Ideation: Yes-Currently Present Has patient been a risk to self within the past 6 months prior to admission? : No Suicidal Intent: Yes-Currently Present Has patient had any suicidal intent within the past 6 months prior to admission? : No Is patient at risk for suicide?: Yes Suicidal Plan?: No Has patient had any suicidal plan within the past 6 months prior to admission? : No Access to Means: No What has been your use of drugs/alcohol within the last 12 months?: Denies Previous Attempts/Gestures: No How many times?: 0 Other Self Harm Risks: (NA) Triggers for Past Attempts: Unknown Intentional Self Injurious Behavior: None Family Suicide History: Unknown Recent stressful life event(s): (NA) Persecutory voices/beliefs?: No Depression: No Depression Symptoms: (Denies) Substance abuse history and/or treatment for substance abuse?: No Suicide prevention information given to non-admitted patients: Not applicable  Risk to Others within the past 6 months Homicidal Ideation: No Does patient have any lifetime risk of violence toward others beyond the six months prior to admission? :  No Thoughts of Harm to Others: No Current Homicidal Intent: No Current Homicidal Plan: No Access to Homicidal Means: No Identified Victim: NA History of harm to others?: No Assessment of Violence: None Noted Violent Behavior Description: NA Does patient have access to weapons?: No Criminal Charges Pending?: No Does patient have a court date: No Is patient on probation?: No  Psychosis Hallucinations: (UTA) Delusions: (UTA)  Mental Status Report Appearance/Hygiene: Bizarre Eye Contact: Unable to Assess Motor Activity: Unable to assess Speech: Unable to assess Level of Consciousness: Unable to assess Mood: Angry Affect: Unable to Assess Anxiety Level: (UTA) Thought Processes: Unable to Assess Judgement: Unable to Assess Orientation: Unable to assess Obsessive Compulsive Thoughts/Behaviors: Unable to Assess  Cognitive Functioning Concentration: Unable to Assess Memory: Unable to Assess Is patient IDD: No Insight: Unable to Assess Impulse Control: Unable to Assess Appetite: (UTA) Have you had any weight changes? : (UTA) Sleep: (UTA) Total Hours of Sleep: (UTA) Vegetative Symptoms: (UTA)  ADLScreening [facility] Assessment Services) Patient's cognitive ability adequate to safely complete daily activities?: Yes Patient able to express need for assistance with ADLs?: Yes Independently performs ADLs?: Yes (appropriate for developmental age)  Prior Inpatient Therapy Prior Inpatient Therapy: Yes Prior Therapy Dates: 2019,2018 [facility] Reason for Treatment: MH  issues  Prior Outpatient Therapy Prior Outpatient Therapy: Yes Prior Therapy Dates: Ongoing Prior Therapy Facilty/Provider(s): [facility] Reason for Treatment: med mang Does patient have an ACCT team?: Yes Does patient have Intensive In-House Services?  : No Does patient have [facility] services? : No Does patient have P4CC services?: No  ADL Screening (condition at time of  admission) Patient's cognitive ability adequate to safely complete daily activities?: Yes Patient able to express need for assistance with ADLs?: Yes Independently performs ADLs?: Yes (appropriate for developmental age)             Merchant navy officerAdvance Directives (For Healthcare) Does Patient Have a Medical Advance Directive?: No Would patient like information on creating a medical advance directive?: No - Patient declined          Disposition: Case was staffed with Earlene Plateravis NP who recommended patient be observed and monitored.  Disposition Initial Assessment Completed for this Encounter: Yes Disposition of Patient: (Observe and monitor ) Type of inpatient treatment program: Adult Patient refused recommended treatment: No Mode of transportation if patient is discharged/movement?: (Unk)  On Site Evaluation by:   Reviewed with Physician:    Alfredia Fergusonavid L Romonia Yanik 10/28/2018 1:51 PM

## 2018-10-28 NOTE — Progress Notes (Addendum)
CSW contacted [facility] ACTT crisis line to inquire if the patient is still followed for ACTT services.  "Lexine Baton" [phone]), confirms that this patient is active with [facility] ACT Team. She is in the community but states that the patient was recently given an injectable medication. The clinician will follow up after she returns to [facility]'s office.  CSW made ACT Team aware that patient is currently at [facility].  Stephanie Acre, [place] Social Worker [phone]

## 2018-10-28 NOTE — ED Notes (Signed)
4 pt belonging bags locked in locker # 31

## 2018-10-28 NOTE — ED Notes (Signed)
After arriving to the unit pt began to loudly cry, when this writer entered the room the pt had stopped and reported that she was cryiing because she could not meet king Sirus the singer. NAD, pt watching tv.

## 2018-10-29 DIAGNOSIS — F200 Paranoid schizophrenia: Secondary | ICD-10-CM | POA: Diagnosis not present

## 2018-10-29 MED ORDER — DIVALPROEX SODIUM ER 500 MG PO TB24: 1500.0000 mg | ORAL_TABLET | Freq: Every day | ORAL | 0 refills | Status: DC

## 2018-10-29 MED ORDER — DIVALPROEX SODIUM ER 500 MG PO TB24: 1500.0000 mg | ORAL_TABLET | Freq: Every day | ORAL | Status: DC

## 2018-10-29 MED ORDER — ALBUTEROL SULFATE HFA 108 (90 BASE) MCG/ACT IN AERS: 1.0000 | INHALATION_SPRAY | Freq: Four times a day (QID) | RESPIRATORY_TRACT | Status: DC | PRN

## 2018-10-29 MED ORDER — GUAIFENESIN 100 MG/5ML PO SOLN
5.0000 mL | Freq: Once | ORAL | Status: AC
  Administered 2018-10-29: 100 mg via ORAL
  Filled 2018-10-29: qty 10

## 2018-10-29 MED ORDER — METOPROLOL SUCCINATE ER 50 MG PO TB24
50.0000 mg | ORAL_TABLET | Freq: Every day | ORAL | Status: DC
  Filled 2018-10-29: qty 1

## 2018-10-29 NOTE — ED Provider Notes (Signed)
[facility] team called and indicates they have reassessed patient and are discharging patient. They indicate pt out of her depakote ER 1500 mg q day and request she get rx for that.   Pt alert, content, nad. Discharge per [facility] team recommendation and plan.   Return precautions provided.      Lajean Saver, MD 10/29/18 [phone]

## 2018-10-29 NOTE — ED Notes (Signed)
Pt discharged safely with RX and discharge instructions.  All belongings were returned to pt.

## 2018-10-29 NOTE — Discharge Instructions (Signed)
It was our pleasure to provide your ER care today - we hope that you feel better.  Take your medications as prescribed.  Follow up with primary care doctor this coming week.  Follow up with your behavioral health provider as arrange by our behavioral health team.  For mental health issues and/or crisis, go directly to [facility].  Return to ER if worse, new symptoms, fevers, trouble breathing, other concern.

## 2018-10-29 NOTE — Consult Note (Addendum)
[facility]  10/29/2018 12:10 PM Molly [name]  MRN:  [id number] Principal Problem: Schizoaffective disorder-chronic with exacerbation [facility](HCC) Discharge Diagnoses: Principal Problem:   Schizoaffective disorder-chronic with exacerbation (HCC)  WUJ:[id number]:Molly [name] is an 33 y.o. female with a history of schizophrenia who presents to the Emergency Department via [facility], complaining of auditory hallucinations. She says the voices are saying she is [employer]. She reports active S/I although denies any plan at this time. Patient denies any H/I or VH. She denies current medication compliance and is unaware of who is currently managing her medications. Patient states she may be pregnant "with the great one." Patient states she "has been walking for days without rest." Patient renders limited history and displays active flight of ideas at times not responding to most of the questions associated with assessment. Per chart EDP notes on admission that patient stated "she was sick"  and has been "walking since Tuesday". She states, "someone thought I was [employer] and they tried to crucify me and pierce my side". Pt has flight of ideas and talks about how she is homeless and has been panhandling and how people think that she is a prostitute. Pt has a legal guardian Marketing executiveDontay Woolar that this Clinical research associatewriter attempted to contact unsuccessfully. Thought process isdisorganized and continue to voice concerns that demons and spirits are after her. Denies homicidal ideations. Denies substance abuse. Denies visual  hallucinations.  Subjective: I ran out of my meds. I took my last medication on Friday. I just need a prescription. I got my last Abillfy shot on 06/16 from [facility] by Dr. Shirline FreesMohammed. She currently resides alone in a single apartment, yet states she is homeless due to roach infestation. She is currently looking for a job, and has been stable at this time. SHe denies any drug use, UDS and BAL was negative. She  has an ACT team and has been compliant with their treatment recommendations.   Total Time spent with patient: 30 minutes  Past Psychiatric History: Bipolar and Schizophrenia. She reportedly is taking Depakote , CLozaril, Folic Acid, and MVI. SHe states she is receiving 2 LAI a month.   Past Medical History:  Past Medical History:  Diagnosis Date  . Abnormal Pap smear   . ASC-cannot exclude HGSIL on Pap 02/15/2012   ASC-US on 02/03/2012 pap (associated Trichomonas infection). No reflex HPV testing performed on specimen.  Patient informed that she will need repeat Pap in one year.      . Asthma   . ATTENTION DEFICIT, W/O HYPERACTIVITY, History of 06/30/2006   Qualifier: History of  By: McDiarmid MD, Tawanna Coolerodd    . CERVICITIS, GONOCOCCAL, History of 01/09/2007   Qualifier: History of  By: McDiarmid MD, Tawanna Coolerodd    . 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 Depression   . Diabetes mellitus    diet controlled  . Eczema   . Hypertension   . Overactive bladder   . Schizophrenia (HCC)   . 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 Dennie Bibleichard Larsen (Psych) [facility] [place], [place]. Qualifier: Hospitalized for  By: McDiarmid MD, Tawanna Coolerodd    . 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 TOBACCO USER 02/08/2009   Qualifier: Diagnosis of  By: Knox Royaltydell, Erin      Past Surgical History:  Procedure Laterality  Date  . INCISION AND DRAINAGE     pilanodal cyst  . TOOTH EXTRACTION N/A 10/07/2017   Procedure: EXTRACTION TEETH NUMBERS ONE, SEVENTEEN, NINETEEN AND THIRTY TWO;  Surgeon: Diona Browner, DDS;  Location: [facility];  Service: Oral Surgery;  Laterality: N/A;   Family History:  Family History  Adopted: Yes  Problem Relation Age of Onset  . Bipolar disorder Sister   . Alcohol abuse Brother   . Cancer Father   . Diabetes Mother    Family Psychiatric   History: As per patient "everybody has mental illness."  Social History:  Social History   Substance and Sexual Activity  Alcohol Use No   Comment: occ     Social History   Substance and Sexual Activity  Drug Use No    Social History   Socioeconomic History  . Marital status: Single    Spouse name: Not on file  . Number of children: Not on file  . Years of education: Not on file  . Highest education level: Not on file  Occupational History  . Not on file  Social Needs  . Financial resource strain: Not on file  . Food insecurity    Worry: Not on file    Inability: Not on file  . Transportation needs    Medical: Not on file    Non-medical: Not on file  Tobacco Use  . Smoking status: Former Smoker    Packs/day: 0.50    Years: 10.00    Pack years: 5.00    Types: Cigarettes  . Smokeless tobacco: Never Used  Substance and Sexual Activity  . Alcohol use: No    Comment: occ  . Drug use: No  . Sexual activity: Yes    Birth control/protection: Injection  Lifestyle  . Physical activity    Days per week: Not on file    Minutes per session: Not on file  . Stress: Not on file  Relationships  . Social Herbalist on phone: Not on file    Gets together: Not on file    Attends religious service: Not on file    Active member of club or organization: Not on file    Attends meetings of clubs or organizations: Not on file    Relationship status: Not on file  Other Topics Concern  . Not on file  Social History Narrative   Adopted   Has Guardian   Living in [place] home with [name]   Transportation: Bus    Has this patient used any form of tobacco in the last 30 days? (Cigarettes, Smokeless Tobacco, Cigars, and/or Pipes) A prescription for an FDA-approved tobacco cessation medication was offered at discharge and the patient refused  Current Medications: Current Facility-Administered Medications  Medication Dose Route Frequency Provider Last Rate Last  Dose  . albuterol (VENTOLIN HFA) 108 (90 Base) MCG/ACT inhaler 1 puff  1 puff Inhalation Q6H PRN Dejour Vos, MD      . cephALEXin (KEFLEX) capsule 500 mg  500 mg Oral BID Couture, Cortni S, PA-C   500 mg at 10/29/18 1053  . cloZAPine (CLOZARIL) tablet 300 mg  300 mg Oral BID Carmin Muskrat, MD   300 mg at 10/29/18 1054  . divalproex (DEPAKOTE ER) 24 hr tablet 1,500 mg  1,500 mg Oral Daily Auron Tadros, MD      . metoprolol succinate (TOPROL-XL) 24 hr tablet 50 mg  50 mg Oral Daily Corena Pilgrim, MD      .  nicotine polacrilex (NICORETTE) gum 2 mg  2 mg Oral PRN Gerhard MunchLockwood, Robert, MD   2 mg at 10/29/18 1150   Current Outpatient Medications  Medication Sig Dispense Refill  . albuterol (PROVENTIL HFA;VENTOLIN HFA) 108 (90 Base) MCG/ACT inhaler Inhale 1 puff into the lungs every 6 (six) hours as needed for wheezing or shortness of breath. (Patient not taking: Reported on 10/28/2018) 1 Inhaler 0  . cloZAPine (CLOZARIL) 100 MG tablet Take 3 tablets (300 mg total) by mouth 2 (two) times daily. (Patient not taking: Reported on 10/28/2018) 60 tablet 0  . divalproex (DEPAKOTE ER) 500 MG 24 hr tablet Take 1,500 mg by mouth daily.    . metoprolol succinate (TOPROL-XL) 50 MG 24 hr tablet Take 1 tablet (50 mg total) by mouth daily. Take with or immediately following a meal. (Patient not taking: Reported on 10/28/2018) 30 tablet 0  . paliperidone (INVEGA SUSTENNA) 234 MG/1.5ML SUSY injection Inject 234 mg into the muscle every 30 (thirty) days.      PTA Medications: (Not in a hospital admission)   Musculoskeletal: Strength & Muscle Tone: within normal limits Gait & Station: normal Patient leans: N/A  Psychiatric Specialty Exam: Physical Exam  ROS  Blood pressure (!) 148/94, pulse (!) 106, temperature 98.3 F (36.8 C), temperature source Oral, resp. rate 18, SpO2 99 %, unknown if currently breastfeeding.There is no height or weight on file to calculate BMI.  General Appearance: Fairly Groomed  and hair is dishelved.  Eye Contact:  Minimal  Speech:  Clear and Coherent and Normal Rate  Volume:  Normal  Mood:  Euthymic  Affect:  Appropriate and Congruent  Thought Process:  Linear and Descriptions of Associations: Tangential  Orientation:  Full (Time, Place, and Person)  Thought Content:  Logical  Suicidal Thoughts:  No  Homicidal Thoughts:  No  Memory:  Immediate;   Fair Recent;   Fair  Judgement:  Intact  Insight:  Fair  Psychomotor Activity:  Normal  Concentration:  Concentration: Fair and Attention Span: Fair  Recall:  [employer]Fair  Fund of Knowledge:  Fair  Language:  Fair  Akathisia:  No  Handed:  Right  AIMS (if indicated):     Assets:  Communication Skills Desire for Improvement Financial Resources/Insurance Housing Physical Health Social Support Others:  ACTT  ADL's:  Intact  Cognition:  WNL  Sleep:        Demographic Factors:  Low socioeconomic status and Living alone  Loss Factors: NA  Historical Factors: Impulsivity  Risk Reduction Factors:   Positive social support, Positive therapeutic relationship and Positive coping skills or problem solving skills  Continued Clinical Symptoms:  Severe Anxiety and/or Agitation Schizophrenia:   Less than 32 years old Paranoid or undifferentiated type More than one psychiatric diagnosis Unstable or Poor Therapeutic Relationship Previous Psychiatric Diagnoses and Treatments  Cognitive Features That Contribute To Risk:  None    Suicide Risk:  Minimal: No identifiable suicidal ideation.  Patients presenting with no risk factors but with morbid ruminations; may be classified as minimal risk based on the severity of the depressive symptoms    Plan Of Care/Follow-up recommendations:  Activity:  Increase activity as tolerated Diet:  Regular house diet Tests:  ROutine diet as directed Other:  COntinue taking medications as directed.   Disposition: Discharge home, and follow up with ACT services. Will provide  prescription for Depakote, at this time we are unable to refill Clozaril.  Maryagnes Amosakia S Starkes-Perry, FNP 10/29/2018, 12:10 PM  Patient seen face-to-face for psychiatric  evaluation, chart reviewed and case discussed with the physician extender and developed treatment plan. Reviewed the information documented and agree with the treatment plan. Corena Pilgrim, MD

## 2018-10-29 NOTE — Progress Notes (Signed)
CSW spoke with Lexine Baton at [facility] ACT team--they are not doing any transportation currently.  She can meet with pt today, initially said she would meet pt at her apartment but pt said she is not going there because of the bugs.  Pt agreed to meet pt at [facility] office on [address]--discussed with pt she needs [place] [address]--pt has been to office before.  Provided pt with address and crisis phone number for [facility].  Pt rides bus regularly and said she can get there. Winferd Humphrey, MSW, [name] Clinical Social Worker 10/29/2018 1:00 PM

## 2019-09-02 ENCOUNTER — Ambulatory Visit (HOSPITAL_COMMUNITY)
Admission: AD | Admit: 2019-09-02 | Discharge: 2019-09-02 | Disposition: A | Discharge status: Home / Self Care | Payer: Medicare Other | Attending: Psychiatry | Admitting: Psychiatry

## 2019-09-02 DIAGNOSIS — F419 Anxiety disorder, unspecified: Secondary | ICD-10-CM | POA: Insufficient documentation

## 2019-09-02 DIAGNOSIS — R450 Nervousness: Secondary | ICD-10-CM | POA: Diagnosis not present

## 2019-09-02 DIAGNOSIS — F333 Major depressive disorder, recurrent, severe with psychotic symptoms: Secondary | ICD-10-CM | POA: Insufficient documentation
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
## 2019-09-03 NOTE — H&P (Signed)
Behavioral Health Medical Screening Exam  Molly [name] is an 34 y.o. female.  Total Time spent with patient: 15 minutes  Psychiatric Specialty Exam: Physical Exam  Constitutional: She is oriented to person, place, and time. She appears well-developed and well-nourished. No distress.  HENT:  Head: Normocephalic and atraumatic.  Right Ear: External ear normal.  Left Ear: External ear normal.  Eyes: Pupils are equal, round, and reactive to light. Right eye exhibits no discharge. Left eye exhibits no discharge.  Respiratory: Effort normal. No respiratory distress.  Musculoskeletal:        General: Normal range of motion.  Neurological: She is alert and oriented to person, place, and time.  Skin: She is not diaphoretic.  Psychiatric: Her mood appears anxious. She is not withdrawn and not actively hallucinating. She exhibits a depressed mood. She expresses no homicidal and no suicidal ideation.    Review of Systems  Constitutional: Negative for activity change, appetite change, chills, diaphoresis, fatigue, fever and unexpected weight change.  Respiratory: Negative for cough and shortness of breath.   Gastrointestinal: Negative for diarrhea, nausea and vomiting.  Psychiatric/Behavioral: Positive for dysphoric mood and hallucinations. Negative for suicidal ideas. The patient is nervous/anxious.       General Appearance: Casual and Fairly Groomed  Eye Contact:  Fair  Speech:  Clear and Coherent and Normal Rate  Volume:  Normal  Mood:  Anxious and Depressed  Affect:  Blunt  Thought Process:  Coherent  Orientation:  Full (Time, Place, and Person)  Thought Content:  Hallucinations: Auditory Visual  Suicidal Thoughts:  No  Homicidal Thoughts:  No  Memory:  Immediate;   Fair  Judgement:  Intact  Insight:  Fair  Psychomotor Activity:  Normal  Concentration: Concentration: Fair and Attention Span: Fair  Recall:  [employer] of Knowledge:Fair  Language: Good  Akathisia:  Negative   Handed:  Right  AIMS (if indicated):     Assets:  Financial Resources/Insurance Housing Leisure Time Physical Health  Sleep:       Musculoskeletal: Strength & Muscle Tone: within normal limits Gait & Station: normal Patient leans: N/A    Recommendations:  Based on my evaluation the patient does not appear to have an emergency medical condition.   Patient states that she would like her medications changed. She showed provider her packaged medications and it appears that she has been taking her Clozaril as prescribed. She denies SI/HI. She reports auditory hallucinations of multiple voices that tell her "to do stuff like have sex, bathe." She reports seeing shadows. Patient instructed to follow up with ACT team on Monday.  Disposition: No evidence of imminent risk to self or others at present.   Patient does not meet criteria for psychiatric inpatient admission. Supportive therapy provided about ongoing stressors. Discussed crisis plan, support from social network, calling 911, coming to the Emergency Department, and calling Suicide Hotline. Law enforcement provided transportation home.   Jackelyn Poling, NP 09/03/2019, 6:27 AM

## 2019-10-30 ENCOUNTER — Other Ambulatory Visit: Payer: Self-pay

## 2019-10-30 ENCOUNTER — Encounter: Payer: Self-pay | Admitting: Pulmonary Disease

## 2019-10-30 ENCOUNTER — Ambulatory Visit (INDEPENDENT_AMBULATORY_CARE_PROVIDER_SITE_OTHER): Payer: Medicare Other | Admitting: Pulmonary Disease

## 2019-10-30 DIAGNOSIS — G4710 Hypersomnia, unspecified: Secondary | ICD-10-CM | POA: Diagnosis not present

## 2019-10-30 NOTE — Assessment & Plan Note (Signed)
It is very possible that she has obstructive sleep apnea given her body habitus and narrow pharyngeal exam.  Her hypersomnolence is clearly related to medications including clozapine.  She also seems to have circadian rhythm issues where she naps during the day after taking her medication and then is not able to sleep at night-causing some degree of insomnia.  I think it would be difficult to get a valid sleep study without first addressing her circadian rhythm issues and medication induced hypersomnolence.  She also seems to have auditory hallucinations at this time which indicates that her schizophrenia is probably not fully controlled.  She also admits to paranoia I have asked her to discuss with her psychiatrist to see if her medications can be adjusted-she states that clozapine makes her sleepy and it may be good to give her an evening dose to help her with sleep and getting her circadian rhythm back on track.  Also told her that it would be okay to add melatonin if needed at bedtime.  If she is able to do this, then she can be referred back and we can schedule sleep study to assess for OSA.

## 2019-10-30 NOTE — Patient Instructions (Signed)
  Discussed with your psychiatrist about adjustment of medications so that you can sleep better at night Okay to trial melatonin 5 to 10 mg at bedtime  We can schedule sleep study once you are able to sleep at least 6 hours continuously

## 2019-10-30 NOTE — Progress Notes (Signed)
Subjective:    Patient ID: Molly [name], female    DOB: 1986/02/04, 34 y.o.   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 She has diagnosed since 2007.  I have reviewed psychiatry evaluation from 09/2019 which indicates that she is still having active auditory hallucinations.  She is compliant with clozapine per this report, but she tells me that she is only using this once daily and not twice daily as prescribed.  Her sleep pattern is very disorganized .  She states that clozapine makes her sleepy, she takes this late morning and yesterday for instance she slept between 11 a- 1 PM, then again from 5 PM to 10 PM and finally from midnight to 3 AM, total of 10 hours scattered into 3 naps during the day. Epworth sleepiness score is 17 and she reports sleepiness while sitting and reading, watching TV, as a passenger in a car or sitting quietly after lunch.  Bedtime is variable can be anywhere from 8 PM to midnight, sleep latency is variable can be up to 3 hours, she sleeps on her side on her stomach, reports 2-3 nocturnal awakenings and does not have a fixed wake up time  There is no history suggestive of cataplexy, sleep paralysis or parasomnias   She had her music on very loudly when I entered the room and even after my leaving the room, she was talking to herself loudly and laughing  She smokes about a pack per day and is not interested in quitting  Past Medical History:  Diagnosis Date  . Abnormal Pap smear   . ASC-cannot exclude HGSIL on Pap 02/15/2012   ASC-US on 02/03/2012 pap (associated Trichomonas infection). No reflex HPV testing performed on specimen.  Patient informed that she will need repeat Pap in one year.      . Asthma   . ATTENTION DEFICIT, W/O HYPERACTIVITY, History of 06/30/2006   Qualifier: History of  By: McDiarmid MD, Tawanna Cooler    . CERVICITIS, GONOCOCCAL, History of 01/09/2007   Qualifier:  History of  By: McDiarmid MD, Tawanna Cooler    . 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 Depression   . Diabetes mellitus    diet controlled  . Eczema   . Hypertension   . Overactive bladder   . Schizophrenia (HCC)   . 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 Dennie Bible (Psych) At [facility] in  [place], [place]. Qualifier: Hospitalized for  By: McDiarmid MD, Tawanna Cooler    . 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 TOBACCO USER 02/08/2009   Qualifier: Diagnosis of  By: Knox Royalty     Past Surgical History:  Procedure Laterality Date  . INCISION AND DRAINAGE     pilanodal cyst  . TOOTH EXTRACTION N/A 10/07/2017   Procedure: EXTRACTION TEETH NUMBERS ONE, SEVENTEEN, NINETEEN AND THIRTY TWO;  Surgeon: Ocie Doyne, DDS;  Location: [facility] OR;  Service: Oral Surgery;  Laterality: N/A;    Allergies  Allergen Reactions  . Abilify [Aripiprazole] Other (See Comments)    Thinks it's nasty- does not want it.  Injection is ok.      Social History   Socioeconomic History  . Marital status: Single    Spouse name: Not on file  . Number of children: Not on  file  . Years of education: Not on file  . Highest education level: Not on file  Occupational History  . Not on file  Tobacco Use  . Smoking status: Current Every Day Smoker    Packs/day: 2.00    Years: 10.00    Pack years: 20.00    Types: Cigarettes  . Smokeless tobacco: Never Used  Vaping Use  . Vaping Use: Never used  Substance and Sexual Activity  . Alcohol use: No    Comment: occ  . Drug use: No  . Sexual activity: Yes    Birth control/protection: Injection  Other Topics Concern  . Not on file  Social History Narrative   Adopted   Has Guardian   Living in [place] home with Bradly Chris   Transportation: Bus   Social Determinants of Health   Financial Resource Strain:   .  Difficulty of Paying Living Expenses:   Food Insecurity:   . Worried About Programme researcher, broadcasting/film/video in the Last Year:   . Barista in the Last Year:   Transportation Needs:   . Freight forwarder (Medical):   Marland Kitchen Lack of Transportation (Non-Medical):   Physical Activity:   . Days of Exercise per Week:   . Minutes of Exercise per Session:   Stress:   . Feeling of Stress :   Social Connections:   . Frequency of Communication with Friends and Family:   . Frequency of Social Gatherings with Friends and Family:   . Attends Religious Services:   . Active Member of Clubs or Organizations:   . Attends Banker Meetings:   Marland Kitchen Marital Status:   Intimate Partner Violence:   . Fear of Current or Ex-Partner:   . Emotionally Abused:   Marland Kitchen Physically Abused:   . Sexually Abused:      Family History  Adopted: Yes  Problem Relation Age of Onset  . Bipolar disorder Sister   . Alcohol abuse Brother   . Cancer Father   . Diabetes Mother        Review of Systems Constitutional: negative for anorexia, fevers and sweats  Eyes: negative for irritation, redness and visual disturbance  Ears, nose, mouth, throat, and face: negative for earaches, epistaxis, nasal congestion and sore throat  Respiratory: negative for cough, dyspnea on exertion, sputum and wheezing  Cardiovascular: negative for chest pain, dyspnea, lower extremity edema, orthopnea, palpitations and syncope  Gastrointestinal: negative for abdominal pain, constipation, diarrhea, melena, nausea and vomiting  Genitourinary:negative for dysuria, frequency and hematuria  Hematologic/lymphatic: negative for bleeding, easy bruising and lymphadenopathy  Musculoskeletal:negative for arthralgias, muscle weakness and stiff joints  Neurological: negative for coordination problems, gait problems, headaches and weakness  Endocrine: negative for diabetic symptoms including polydipsia, polyuria and weight loss  Psych - + auditory  hallucinations, paranoia     Objective:   Physical Exam  Gen. Pleasant, obese, in no distress, normal affect ENT - no pallor,icterus, no post nasal drip, class 2-3 airway Neck: No JVD, no thyromegaly, no carotid bruits Lungs: no use of accessory muscles, no dullness to percussion, decreased without rales or rhonchi  Cardiovascular: Rhythm regular, heart sounds  normal, no murmurs or gallops, no peripheral edema Abdomen: soft and non-tender, no hepatosplenomegaly, BS normal. Musculoskeletal: No deformities, no cyanosis or clubbing Neuro:  alert, non focal, no tremors       Assessment & Plan:

## 2020-01-08 ENCOUNTER — Telehealth: Payer: Self-pay | Admitting: Pulmonary Disease

## 2020-01-08 NOTE — Telephone Encounter (Signed)
LMTCB x 1 

## 2020-01-10 NOTE — Telephone Encounter (Signed)
lmtcb for Molly [name].

## 2020-01-12 NOTE — Telephone Encounter (Signed)
Closing per protocol 

## 2020-02-07 DIAGNOSIS — Z720 Tobacco use: Secondary | ICD-10-CM | POA: Diagnosis not present

## 2020-02-07 DIAGNOSIS — E782 Mixed hyperlipidemia: Secondary | ICD-10-CM | POA: Diagnosis not present

## 2020-02-07 DIAGNOSIS — D508 Other iron deficiency anemias: Secondary | ICD-10-CM | POA: Diagnosis not present

## 2020-02-07 DIAGNOSIS — R7303 Prediabetes: Secondary | ICD-10-CM | POA: Diagnosis not present

## 2020-02-07 DIAGNOSIS — Z9189 Other specified personal risk factors, not elsewhere classified: Secondary | ICD-10-CM | POA: Diagnosis not present

## 2020-02-07 DIAGNOSIS — L0293 Carbuncle, unspecified: Secondary | ICD-10-CM | POA: Diagnosis not present

## 2020-03-07 DIAGNOSIS — Z79899 Other long term (current) drug therapy: Secondary | ICD-10-CM | POA: Diagnosis not present

## 2020-03-26 DIAGNOSIS — R000 Tachycardia, unspecified: Secondary | ICD-10-CM | POA: Diagnosis not present

## 2020-04-15 ENCOUNTER — Other Ambulatory Visit: Payer: Self-pay

## 2020-04-15 DIAGNOSIS — Z760 Encounter for issue of repeat prescription: Secondary | ICD-10-CM | POA: Diagnosis not present

## 2020-04-15 DIAGNOSIS — I10 Essential (primary) hypertension: Secondary | ICD-10-CM | POA: Diagnosis not present

## 2020-04-15 DIAGNOSIS — R0902 Hypoxemia: Secondary | ICD-10-CM | POA: Diagnosis not present

## 2020-04-15 DIAGNOSIS — Z5321 Procedure and treatment not carried out due to patient leaving prior to being seen by health care provider: Secondary | ICD-10-CM | POA: Diagnosis not present

## 2020-04-15 DIAGNOSIS — R000 Tachycardia, unspecified: Secondary | ICD-10-CM | POA: Diagnosis not present

## 2020-04-15 NOTE — ED Triage Notes (Signed)
Pt BIB EMS from [place]. EMS reports pt was walking around [place] and [employer] was called for trespassing. On scene pt requested a med refill and was transported to ED. Pt is uncooperative in triage, yelling at staff, and would not stay for initial vital signs.

## 2020-04-16 ENCOUNTER — Emergency Department (HOSPITAL_COMMUNITY)
Admission: EM | Admit: 2020-04-16 | Discharge: 2020-04-16 | Disposition: A | Discharge status: Home / Self Care | Payer: Medicare Other | Attending: Emergency Medicine | Admitting: Emergency Medicine

## 2021-05-20 ENCOUNTER — Other Ambulatory Visit: Payer: Self-pay

## 2021-05-20 ENCOUNTER — Emergency Department (HOSPITAL_COMMUNITY)
Admission: EM | Admit: 2021-05-20 | Discharge: 2021-05-23 | Disposition: A | Discharge status: Home / Self Care | Payer: 59 | Attending: Emergency Medicine | Admitting: Emergency Medicine

## 2021-05-20 ENCOUNTER — Emergency Department (HOSPITAL_COMMUNITY): Payer: 59

## 2021-05-20 DIAGNOSIS — F259 Schizoaffective disorder, unspecified: Secondary | ICD-10-CM | POA: Diagnosis not present

## 2021-05-20 DIAGNOSIS — Z79899 Other long term (current) drug therapy: Secondary | ICD-10-CM | POA: Insufficient documentation

## 2021-05-20 DIAGNOSIS — D75839 Thrombocytosis, unspecified: Secondary | ICD-10-CM | POA: Diagnosis not present

## 2021-05-20 DIAGNOSIS — E860 Dehydration: Secondary | ICD-10-CM | POA: Diagnosis not present

## 2021-05-20 DIAGNOSIS — R000 Tachycardia, unspecified: Secondary | ICD-10-CM | POA: Insufficient documentation

## 2021-05-20 DIAGNOSIS — Z20822 Contact with and (suspected) exposure to covid-19: Secondary | ICD-10-CM | POA: Diagnosis not present

## 2021-05-20 DIAGNOSIS — R718 Other abnormality of red blood cells: Secondary | ICD-10-CM | POA: Diagnosis not present

## 2021-05-20 DIAGNOSIS — F319 Bipolar disorder, unspecified: Secondary | ICD-10-CM | POA: Diagnosis not present

## 2021-05-20 DIAGNOSIS — Z1339 Encounter for screening examination for other mental health and behavioral disorders: Secondary | ICD-10-CM | POA: Insufficient documentation

## 2021-05-20 DIAGNOSIS — Z046 Encounter for general psychiatric examination, requested by authority: Secondary | ICD-10-CM | POA: Insufficient documentation

## 2021-05-20 DIAGNOSIS — Z7951 Long term (current) use of inhaled steroids: Secondary | ICD-10-CM | POA: Insufficient documentation

## 2021-05-20 LAB — COMPREHENSIVE METABOLIC PANEL
ALT: 15 U/L (ref 0–44)
AST: 16 U/L (ref 15–41)
Albumin: 4 g/dL (ref 3.5–5.0)
Alkaline Phosphatase: 107 U/L (ref 38–126)
Anion gap: 17 — ABNORMAL HIGH (ref 5–15)
BUN: 17 mg/dL (ref 6–20)
CO2: 23 mmol/L (ref 22–32)
Calcium: 10.4 mg/dL — ABNORMAL HIGH (ref 8.9–10.3)
Chloride: 96 mmol/L — ABNORMAL LOW (ref 98–111)
Creatinine, Ser: 1.3 mg/dL — ABNORMAL HIGH (ref 0.44–1.00)
GFR, Estimated: 55 mL/min — ABNORMAL LOW (ref >60)
Glucose, Bld: 91 mg/dL (ref 70–99)
Potassium: 3.4 mmol/L — ABNORMAL LOW (ref 3.5–5.1)
Sodium: 136 mmol/L (ref 135–145)
Total Bilirubin: 0.8 mg/dL (ref 0.3–1.2)
Total Protein: 8.8 g/dL — ABNORMAL HIGH (ref 6.5–8.1)

## 2021-05-20 LAB — CBC WITH DIFFERENTIAL/PLATELET
Abs Immature Granulocytes: 0.1 10*3/uL — ABNORMAL HIGH (ref 0.00–0.07)
Basophils Absolute: 0.1 10*3/uL (ref 0.0–0.1)
Basophils Relative: 0 %
Eosinophils Absolute: 0.1 10*3/uL (ref 0.0–0.5)
Eosinophils Relative: 1 %
HCT: 46.5 % — ABNORMAL HIGH (ref 36.0–46.0)
Hemoglobin: 15.1 g/dL — ABNORMAL HIGH (ref 12.0–15.0)
Immature Granulocytes: 1 %
Lymphocytes Relative: 17 %
Lymphs Abs: 3 10*3/uL (ref 0.7–4.0)
MCH: 27.6 pg (ref 26.0–34.0)
MCHC: 32.5 g/dL (ref 30.0–36.0)
MCV: 85 fL (ref 80.0–100.0)
Monocytes Absolute: 1.5 10*3/uL — ABNORMAL HIGH (ref 0.1–1.0)
Monocytes Relative: 9 %
Neutro Abs: 12.7 10*3/uL — ABNORMAL HIGH (ref 1.7–7.7)
Neutrophils Relative %: 72 %
Platelets: 467 10*3/uL — ABNORMAL HIGH (ref 150–400)
RBC: 5.47 MIL/uL — ABNORMAL HIGH (ref 3.87–5.11)
RDW: 16 % — ABNORMAL HIGH (ref 11.5–15.5)
WBC: 17.5 10*3/uL — ABNORMAL HIGH (ref 4.0–10.5)
nRBC: 0 % (ref 0.0–0.2)

## 2021-05-20 LAB — RESP PANEL BY RT-PCR (FLU A&B, COVID) ARPGX2
Influenza A by PCR: NEGATIVE
Influenza B by PCR: NEGATIVE
SARS Coronavirus 2 by RT PCR: NEGATIVE

## 2021-05-20 LAB — I-STAT BETA HCG BLOOD, ED (MC, WL, AP ONLY): I-stat hCG, quantitative: <5 m[IU]/mL (ref <5)

## 2021-05-20 LAB — ETHANOL: Alcohol, Ethyl (B): <10 mg/dL (ref <10)

## 2021-05-20 MED ORDER — CLOZAPINE 100 MG PO TABS
300.0000 mg | ORAL_TABLET | Freq: Two times a day (BID) | ORAL | Status: DC
  Administered 2021-05-20 (×2): 300 mg via ORAL
  Filled 2021-05-20 (×3): qty 3

## 2021-05-20 MED ORDER — SODIUM CHLORIDE 0.9 % IV BOLUS
1000.0000 mL | Freq: Once | INTRAVENOUS | Status: AC
  Administered 2021-05-20: 1000 mL via INTRAVENOUS

## 2021-05-20 MED ORDER — DIVALPROEX SODIUM ER 500 MG PO TB24: 1500.0000 mg | ORAL_TABLET | Freq: Every day | ORAL | Status: DC

## 2021-05-20 NOTE — ED Notes (Signed)
Pt agreeable to IV and fluids. Conversation remains bizarre and tangential, pt asking inappropriate questions like "do you want to take my skin off? Do you want to take my face? Do you want to take my nipples off?" during IV insertion. Pt spoke in nonsensical tongues-like language at length while RN was in the room. RN collected pt's clothing items including a sparkly white dress and large maroon winter jacket and pair of green tennis shoes; RN told pt her belongings would be taken to a locker, and pt stated "what?! You can't take that, that's my baby!!" Pt was assured that belongings would have her label on them and kept safe in a locker.  Pt still currently in a hospital gown and on heart monitor, but belongings have been removed from the room.

## 2021-05-20 NOTE — ED Notes (Signed)
On arrival pt is refusing to respond verbally, glaring at staff and refuses to allow staff to place electrodes for EKG or obtain further VS. Pt refuses to answer questions re: chief complaint. Pt intermittently answered questions for PA but sometimes inappropriate, alternating between glaring and eyes searching around room. Pt refused blood draw currently.

## 2021-05-20 NOTE — BH Assessment (Signed)
Clinician attempted to engage patient but she mumbled and went back to sleep.

## 2021-05-20 NOTE — ED Provider Notes (Signed)
[facility] Provider Note   CSN: [id number] Arrival date & time: 05/20/21  1035     History  Chief Complaint  Patient presents with   Psychiatric Evaluation    Molly FreshJessica S [name] is a 36 y.o. female.  HPI  Patient with past medical history of schizoaffective disorder and bipolar unspecified type who presents due to psychiatric state.  Level 5 caveat applies secondary to psychiatric condition.  Patient was found in the grocery store wearing a sequin dress, unwilling to leave the grocery store and uncooperative.  EMS was called, she was brought to ED for psychiatric evaluation.  Patient states she has not taken her medicine in 2 days, she will answer some questions but not others.  She appears to be responding to internal stimuli.  Home Medications Prior to Admission medications   Medication Sig Start Date End Date Taking? Authorizing Provider  cloZAPine (CLOZARIL) 100 MG tablet Take 3 tablets (300 mg total) by mouth 2 (two) times daily. Patient taking differently: Take 100 mg by mouth every morning. 05/29/16  Yes Charm RingsLord, Jamison Y, NP  clozapine (CLOZARIL) 200 MG tablet Take 200 mg by mouth at bedtime. 04/29/21  Yes [provider]  folic acid (FOLVITE) 1 MG tablet Take 1 mg by mouth every morning. 04/29/21  Yes [provider]  lithium carbonate (ESKALITH) 450 MG CR tablet Take 450 mg by mouth in the morning. 04/29/21  Yes [provider]  lithium carbonate (LITHOBID) 300 MG CR tablet Take 600 mg by mouth at bedtime. 04/29/21  Yes [provider]  melatonin 3 MG TABS tablet Take 6 mg by mouth at bedtime.   Yes [provider]  paliperidone (INVEGA) 6 MG 24 hr tablet Take 12 mg by mouth in the morning. 04/29/21  Yes [provider]  albuterol (PROVENTIL HFA;VENTOLIN HFA) 108 (90 Base) MCG/ACT inhaler Inhale 1 puff into the lungs every 6 (six) hours as needed for wheezing or shortness of  breath. Patient not taking: Reported on 05/20/2021 03/20/18   Eustace MooreNelson, Yvonne Sue, MD  divalproex (DEPAKOTE ER) 500 MG 24 hr tablet Take 1,500 mg by mouth daily. Patient not taking: Reported on 05/20/2021    [provider]      Allergies    Abilify [aripiprazole]    Review of Systems   Review of Systems  Unable to perform ROS: Psychiatric disorder   Physical Exam Updated Vital Signs BP (!) 119/92    Pulse (!) 123    Temp 98 F (36.7 C)    Resp 20    Ht 5\' 8"  (1.727 m)    Wt 125 kg    SpO2 95%    BMI 41.90 kg/m  Physical Exam Vitals and nursing note reviewed. Exam conducted with a chaperone present.  Constitutional:      Appearance: Normal appearance. She is obese.     Comments: Poor hygiene, patient wearing sequin dress at 10:30 AM.  HENT:     Head: Normocephalic and atraumatic.  Eyes:     General: No scleral icterus.       Right eye: No discharge.        Left eye: No discharge.     Extraocular Movements: Extraocular movements intact.     Pupils: Pupils are equal, round, and reactive to light.  Cardiovascular:     Rate and Rhythm: Regular rhythm. Tachycardia present.     Pulses: Normal pulses.     Heart sounds: Normal heart sounds. No murmur  heard.   No friction rub. No gallop.  Pulmonary:     Effort: Pulmonary effort is normal. No respiratory distress.     Breath sounds: Normal breath sounds.  Abdominal:     General: Abdomen is flat. Bowel sounds are normal. There is no distension.     Palpations: Abdomen is soft.     Tenderness: There is no abdominal tenderness.  Skin:    General: Skin is warm and dry.     Coloration: Skin is not jaundiced.  Neurological:     Mental Status: She is alert.     Coordination: Coordination normal.  Psychiatric:        Attention and Perception: She is inattentive.        Mood and Affect: Affect is inappropriate.        Speech: She is noncommunicative. Speech is tangential.        Behavior: Behavior is cooperative.         Judgment: Judgment is inappropriate.   ED Results / Procedures / Treatments   Labs (all labs ordered are listed, but only abnormal results are displayed) Labs Reviewed  COMPREHENSIVE METABOLIC PANEL - Abnormal; Notable for the following components:      Result Value   Potassium 3.4 (*)    Chloride 96 (*)    Creatinine, Ser 1.30 (*)    Calcium 10.4 (*)    Total Protein 8.8 (*)    GFR, Estimated 55 (*)    Anion gap 17 (*)    All other components within normal limits  CBC WITH DIFFERENTIAL/PLATELET - Abnormal; Notable for the following components:   WBC 17.5 (*)    RBC 5.47 (*)    Hemoglobin 15.1 (*)    HCT 46.5 (*)    RDW 16.0 (*)    Platelets 467 (*)    Neutro Abs 12.7 (*)    Monocytes Absolute 1.5 (*)    Abs Immature Granulocytes 0.10 (*)    All other components within normal limits  RESP PANEL BY RT-PCR (FLU A&B, COVID) ARPGX2  ETHANOL  RAPID URINE DRUG SCREEN, HOSP PERFORMED  I-STAT BETA HCG BLOOD, ED ([facility], [facility], [facility] ONLY)    EKG EKG Interpretation  Date/Time:  Wednesday May 20 2021 11:33:47 EST Ventricular Rate:  130 PR Interval:  131 QRS Duration: 68 QT Interval:  317 QTC Calculation: 467 R Axis:   57 Text Interpretation: Sinus tachycardia Borderline T abnormalities, diffuse leads Abnormal ECG Confirmed by Gerhard Munch [id number]) on 05/20/2021 12:42:40 PM  Radiology No results found.  Procedures Procedures    Medications Ordered in ED Medications  cloZAPine (CLOZARIL) tablet 300 mg (has no administration in time range)  sodium chloride 0.9 % bolus 1,000 mL (has no administration in time range)    ED Course/ Medical Decision Making/ A&P                           Medical Decision Making Amount and/or Complexity of Data Reviewed Labs: ordered. Radiology: ordered.  Risk Prescription drug management.   This is a 36 year old female presenting due to psychiatric process.  She is having persecutory delusions, exhibiting strange behavior.  Appears to be  responding to internal stimuli.  Patient will need psychiatric evaluation, I am unable to obtain much history from her.  She is tachycardic on exam, lungs are clear to auscultation bilaterally.  She denies any abdominal pain and her abdomen is soft.  We will give 1 dose of home  meds and continue to evaluate on cardiac monitoring.  We will work-up medically and plan for psychiatric evaluation.  Given patient's psychiatric state, will fill out IVC paperwork in case patient becomes agitated or uncooperative.  She is somebody who will need psychiatric evaluation.   Additional history obtained: -External records from outside source obtained and reviewed including: Chart review including previous notes, labs, imaging, consultation notes   Lab Tests: -I ordered, reviewed, and interpreted labs.  The pertinent results include:   CBC shows leukocytosis of 17.5  On chart review she has a history of leukocytosis although 17 is significantly elevated from previous ranging between 12-15.  Could be reactionary from acute withdrawal process, unable to get more history from the patient at this time.  I suspect the patient may be hemoconcentrated given she also has a thrombocytosis, and elevated RBC and hemoglobin.   CMP: Elevation of creatinine at 1.30.  This is elevated compared to the most recent I can compare to in September 2022.  No gross electrolyte derangement.  Will treat with fluids. UDS pending.   EKG -Sinus tachycardia, no ischemia.   Patient appears to be having psychiatric crisis.  I suspect is secondary to medical noncompliance, she also appears dehydrated.  Patient will need more thorough work-up inpatient.  IVC paperwork completed if necessary.  At the time she is medically cleared and appropriate for psychiatric work-up.        Final Clinical Impression(s) / ED Diagnoses Final diagnoses:  None    Rx / DC Orders ED Discharge Orders     None         Theron Arista, [place] 05/20/21  1319    Gerhard Munch, MD 05/20/21 1537

## 2021-05-20 NOTE — ED Notes (Signed)
Pt agreeable to EKG and blood draw at this time. Asking for something to drink and a snack. RN provided beverage, crackers and cheese. Blood to lab, EKG to MD.

## 2021-05-20 NOTE — ED Notes (Signed)
Delay in med admin d/t pharmacy verification. Pt resting in stretcher with eyes closed.

## 2021-05-20 NOTE — ED Notes (Signed)
Introduced self to pt. Pt no longer wants IV fluids. RN disconnected the fluids from pt line. Pt states that she is "looking for a baby." Pt walking around room cleaning it up. Pt sat up and ate some of her lunch tray. Pt now resting in bed with eyes closed, rise and fall of the chest noted.

## 2021-05-20 NOTE — ED Triage Notes (Signed)
Pt BIF EMS, they were called out to a local grocery store where pt was sitting on the floor acting bizarrely, exhibiting flight of ideas, paranoia and persecutory delusions. Pt was agreeable to come into ED with EMS, oriented to person, place and time. EMS reports that pt said she doesn't smoke cigarettes because "they are trying to get me high." Pt also told EMS "I have been out of my medicine for a couple days" and "the psych doctor in [place] says I have to take my meds every day."  BP 128/80, 140HR with sinus tach on EKG, 100% RA.

## 2021-05-21 DIAGNOSIS — F259 Schizoaffective disorder, unspecified: Secondary | ICD-10-CM | POA: Diagnosis not present

## 2021-05-21 LAB — CBC WITH DIFFERENTIAL/PLATELET
Abs Immature Granulocytes: 0.08 10*3/uL — ABNORMAL HIGH (ref 0.00–0.07)
Basophils Absolute: 0.1 10*3/uL (ref 0.0–0.1)
Basophils Relative: 0 %
Eosinophils Absolute: 0.1 10*3/uL (ref 0.0–0.5)
Eosinophils Relative: 1 %
HCT: 40.7 % (ref 36.0–46.0)
Hemoglobin: 13.8 g/dL (ref 12.0–15.0)
Immature Granulocytes: 1 %
Lymphocytes Relative: 20 %
Lymphs Abs: 2.9 10*3/uL (ref 0.7–4.0)
MCH: 28.6 pg (ref 26.0–34.0)
MCHC: 33.9 g/dL (ref 30.0–36.0)
MCV: 84.3 fL (ref 80.0–100.0)
Monocytes Absolute: 1.2 10*3/uL — ABNORMAL HIGH (ref 0.1–1.0)
Monocytes Relative: 8 %
Neutro Abs: 10.1 10*3/uL — ABNORMAL HIGH (ref 1.7–7.7)
Neutrophils Relative %: 70 %
Platelets: 415 10*3/uL — ABNORMAL HIGH (ref 150–400)
RBC: 4.83 MIL/uL (ref 3.87–5.11)
RDW: 15.6 % — ABNORMAL HIGH (ref 11.5–15.5)
WBC: 14.5 10*3/uL — ABNORMAL HIGH (ref 4.0–10.5)
nRBC: 0 % (ref 0.0–0.2)

## 2021-05-21 LAB — RAPID URINE DRUG SCREEN, HOSP PERFORMED
Amphetamines: NOT DETECTED
Barbiturates: NOT DETECTED
Benzodiazepines: NOT DETECTED
Cocaine: NOT DETECTED
Opiates: NOT DETECTED
Tetrahydrocannabinol: NOT DETECTED

## 2021-05-21 LAB — D-DIMER, QUANTITATIVE: D-Dimer, Quant: 3.91 ug/mL-FEU — ABNORMAL HIGH (ref 0.00–0.50)

## 2021-05-21 LAB — TROPONIN I (HIGH SENSITIVITY): Troponin I (High Sensitivity): 7 ng/L (ref <18)

## 2021-05-21 LAB — VALPROIC ACID LEVEL: Valproic Acid Lvl: <10 ug/mL — ABNORMAL LOW (ref 50.0–100.0)

## 2021-05-21 LAB — LITHIUM LEVEL: Lithium Lvl: 0.43 mmol/L — ABNORMAL LOW (ref 0.60–1.20)

## 2021-05-21 MED ORDER — CLOZAPINE 100 MG PO TABS
200.0000 mg | ORAL_TABLET | Freq: Every day | ORAL | Status: DC
  Administered 2021-05-22: 200 mg via ORAL
  Filled 2021-05-21 (×2): qty 2

## 2021-05-21 MED ORDER — LITHIUM CARBONATE ER 450 MG PO TBCR
450.0000 mg | EXTENDED_RELEASE_TABLET | Freq: Every morning | ORAL | Status: DC
  Administered 2021-05-21 – 2021-05-23 (×3): 450 mg via ORAL
  Filled 2021-05-21 (×3): qty 1

## 2021-05-21 MED ORDER — CLOZAPINE 100 MG PO TABS
100.0000 mg | ORAL_TABLET | Freq: Every morning | ORAL | Status: DC
  Administered 2021-05-21 – 2021-05-23 (×3): 100 mg via ORAL
  Filled 2021-05-21 (×4): qty 1

## 2021-05-21 MED ORDER — LITHIUM CARBONATE ER 300 MG PO TBCR
600.0000 mg | EXTENDED_RELEASE_TABLET | Freq: Every day | ORAL | Status: DC
  Administered 2021-05-21 – 2021-05-22 (×2): 600 mg via ORAL
  Filled 2021-05-21 (×3): qty 2

## 2021-05-21 MED ORDER — FOLIC ACID 1 MG PO TABS
1.0000 mg | ORAL_TABLET | Freq: Every morning | ORAL | Status: DC
  Administered 2021-05-21 – 2021-05-23 (×3): 1 mg via ORAL
  Filled 2021-05-21 (×3): qty 1

## 2021-05-21 MED ORDER — MELATONIN 3 MG PO TABS
6.0000 mg | ORAL_TABLET | Freq: Every day | ORAL | Status: DC
  Administered 2021-05-21 – 2021-05-22 (×2): 6 mg via ORAL
  Filled 2021-05-21 (×2): qty 2

## 2021-05-21 MED ORDER — PALIPERIDONE ER 6 MG PO TB24
12.0000 mg | ORAL_TABLET | Freq: Every morning | ORAL | Status: DC
  Administered 2021-05-21 – 2021-05-23 (×3): 12 mg via ORAL
  Filled 2021-05-21 (×3): qty 2

## 2021-05-21 MED ORDER — PHENOL 1.4 % MT LIQD
1.0000 | OROMUCOSAL | Status: DC | PRN
  Filled 2021-05-21: qty 177

## 2021-05-21 MED ORDER — ACETAMINOPHEN 325 MG PO TABS
650.0000 mg | ORAL_TABLET | Freq: Four times a day (QID) | ORAL | Status: DC | PRN
  Administered 2021-05-21: 650 mg via ORAL
  Filled 2021-05-21: qty 2

## 2021-05-21 NOTE — ED Provider Notes (Signed)
Patient informs that she has having chest pain.  She is tachycardic on reassessment.  Will order EKG repeat, troponin, and D-dimer.  If work-up reassuring, suspect she is still stable for continued psychiatric management.  EKG showed sinus tachycardia.  No STEMI.  11:39 PM D-dimer is elevated, will order CT PE study.  Still waiting on troponin.  Care transferred to oncoming team to await results of work-up.  Continue psychiatric management if she remains medically clear.   Apoorva Bugay, Canary Brim, MD 05/22/21 [phone]

## 2021-05-21 NOTE — ED Notes (Signed)
Pt complaining of chest pain. Tegeler MD made aware

## 2021-05-21 NOTE — ED Notes (Addendum)
Pt refused to have blood work drawn. Continuing to scream "ow". Tegeler MD made aware.

## 2021-05-21 NOTE — ED Notes (Signed)
Pt is eating breakfast  

## 2021-05-21 NOTE — ED Notes (Addendum)
Pt refusing EKG. Rn educated importance considering pt is tachycardic and complaining of chest pain. Pt refused. Tegeler MD made aware

## 2021-05-21 NOTE — BH Assessment (Signed)
Pt let RN Sheria Lang know that he needed to move on and see some other patients and let him know if patient can be seen later.

## 2021-05-21 NOTE — ED Notes (Signed)
Attempted to wake pt up to speak to tts, pt woke up and then put head back down with eyes closed and ignoring staff.

## 2021-05-21 NOTE — ED Notes (Signed)
Pt is refusing lab redraws despite multiple attempts to educate. Will try again later

## 2021-05-21 NOTE — ED Notes (Signed)
DSS Guardian Dontae Woolard; contact info Work CELL #[phone], Office #[phone].

## 2021-05-21 NOTE — ED Notes (Signed)
Pt ambulated to bathroom and provided urine sample after eating breakfast

## 2021-05-21 NOTE — ED Notes (Addendum)
Pt woken up to take a shower due to being incont in bed. Pt ambulated to bathroom with no problem. Pt changed into purple scrubs but insisted on keeping the soiled underwear on. Pt also now agreeable to speak with tts

## 2021-05-21 NOTE — Consult Note (Addendum)
Patient seen via tele health by this provider; chart reviewed and consulted with Dr. Lucianne Muss on 05/21/21. On evaluation Molly CALAMARI is lying down in bed with her eyes closed. When asked to sit up and participate in the assessment she continues to remain lying down in a prone position with her eyes closed. Patient answers questions pertaining to orientation and is noted to be oriented to person, place and time. When asked if she knows why she was brought to the emergency department she states, "I fell on the floor at a convince store." When asked to elaborate, patient does not engage. Patient stopped responding to assessment questions. Provider ended assessment due to patient's lack of participation.  Rosanne Sack, RN., made aware.  Labs ordered by this provider:  CBC w/diff repeat  Lithium level   Patient re-started on home medications while in ED:  cloZAPine  100 mg Oral q AM   [START ON 05/22/2021] cloZAPine  200 mg Oral QHS   folic acid  1 mg Oral q morning   lithium carbonate  450 mg Oral q AM   lithium carbonate  600 mg Oral QHS   melatonin  6 mg Oral QHS   paliperidone  12 mg Oral q AM   Disposition: Patient continues to be recommended for inpatient psychiatric treatment. Psychiatry CSW to seek placement.

## 2021-05-21 NOTE — ED Notes (Signed)
Patient is refusing ekg. States "no" when I asked to do it. Patient then asked if she could put the stickers on herself. This nt told her yes but she refused to lay on her back or respond any further.

## 2021-05-21 NOTE — ED Provider Notes (Signed)
Emergency Medicine Observation Re-evaluation Note  Molly [name] is a 36 y.o. female, seen on rounds today.  Pt initially presented to the ED for complaints of Psychiatric Evaluation Currently, the patient is awake, but not interactive.  Nurses report no major issues in the night.  Physical Exam  BP (!) 139/122    Pulse (!) 130    Temp 99.1 F (37.3 C) (Oral)    Resp 18    Ht 5\' 8"  (1.727 m)    Wt 125 kg    SpO2 100%    BMI 41.90 kg/m  Physical Exam General: awake Cardiac: rrr Lungs: cta b Psych: awake, but not answering questions  ED Course / MDM  EKG:EKG Interpretation  Date/Time:  Wednesday May 20 2021 11:33:47 EST Ventricular Rate:  130 PR Interval:  131 QRS Duration: 68 QT Interval:  317 QTC Calculation: 467 R Axis:   57 Text Interpretation: Sinus tachycardia Borderline T abnormalities, diffuse leads Abnormal ECG Confirmed by 03-11-1990 [phone]) on 05/20/2021 12:42:40 PM  I have reviewed the labs performed to date as well as medications administered while in observation.  Recent changes in the last 24 hours include recommendation for inpatient treatment.  Plan  Current plan is for waiting for inpatient treatment.  Molly [name] is not under involuntary commitment.     Molly Fresh, MD 05/21/21 [phone]

## 2021-05-21 NOTE — BH Assessment (Addendum)
Comprehensive Clinical Assessment (CCA) Note  05/21/2021 ILYN SHORTT [id number] Disposition: Clinician discussed patient care with Molly Romp, FNP.  He recommended patient for inpatient care.  Clinician informed RN Molly [name] of disposition recommendation via secure messaging.  Pt is drowsy acting when being assessed.  She takes the opportunity to pick nose and has minimal eye contact.  Pt is not oriented to time, date or situation.  Pt lays back down and covers her head with sheet and gives minimal, hard to understand, answers.  Patient appears to be responding to internal stimuli.  She has been off medication for an unknown period of time.  Patient cannot answer questions clearly or directly at this time.    Pt was at [facility] last in 10/2018.  Pt may have an ACTT team but she could not name who it was.     Chief Complaint:  Chief Complaint  Patient presents with   Psychiatric Evaluation   Visit Diagnosis: Schizophrenia    CCA Screening, Triage and Referral (STR)  Patient Reported Information How did you hear about [place]? Other (Comment) (Pt was brought to [facility] by EMS.)  What Is the Reason for Your Visit/Call Today? Pt said she was brought in to [facility] because she fell down in a store.  Pt said she has not been on her medication in awhile.  Pt hears voices but does not know what they are telling her.  She sees things but cannot tell what she sees.  Pt denies any SI or HI.  Pt has a counselor named [name].  Pt has guardianship through Molly [name].  Pt says she has her own place to live. Pt turns away from clincian and lays down on stretcher, covering her head with blanket.  Pt answers questions with one word responses.  Patient is a ward of [place].  Her DSS guardian is Molly [name] [phone], cell [phone].  How Long Has This Been Causing You Problems? > than 6 months  What Do You Feel Would Help You the Most Today? Treatment for Depression or other mood  problem   Have You Recently Had Any Thoughts About Hurting Yourself? No  Are You Planning to Commit Suicide/Harm Yourself At This time? No   Have you Recently Had Thoughts About [name]? No  Are You Planning to Harm Someone at This Time? No  Explanation: No data recorded  Have You Used Any Alcohol or Drugs in the Past 24 Hours? No  How Long Ago Did You Use Drugs or Alcohol? No data recorded What Did You Use and How Much? No data recorded  Do You Currently Have a Therapist/Psychiatrist? Yes  Name of Therapist/Psychiatrist: Pt had mentioned having an ACTT team but could not name it.   Have You Been Recently Discharged From Any Office Practice or Programs? No  Explanation of Discharge From Practice/Program: No data recorded    CCA Screening Triage Referral Assessment Type of Contact: Tele-Assessment  Telemedicine Service Delivery:   Is this Initial or Reassessment? Initial Assessment  Date Telepsych consult ordered in [facility]:  05/20/21  Time Telepsych consult ordered in [facility]:  1321  Location of Assessment: [name] ED  Provider Location: [name]   Collateral Involvement: No data recorded  Does Patient Have a [place]? No data recorded Name and Contact of Legal Guardian: No data recorded If Minor and Not Living with Parent(s), Who has Custody? No data recorded Is CPS involved or ever been  involved? No data recorded Is [name] involved or ever been involved? No data recorded  Patient Determined To Be At Risk for Harm To Self or Others Based on Review of Patient Reported Information or Presenting Complaint? No  Method: No data recorded Availability of Means: No data recorded Intent: No data recorded Notification Required: No data recorded Additional Information for Danger to Others Potential: No data recorded Additional Comments for Danger to Others Potential: No data recorded Are There Guns or Other Weapons in Your Home?  No data recorded Types of Guns/Weapons: No data recorded Are These Weapons Safely Secured?                            No data recorded Who Could Verify You Are Able To Have These Secured: No data recorded Do You Have any Outstanding Charges, Pending Court Dates, Parole/Probation? No data recorded Contacted To Inform of Risk of Harm To Self or Others: No data recorded   Does Patient Present under Involuntary Commitment? No  IVC Papers Initial File Date: No data recorded  [place] of Residence: Molly [name]   Patient Currently Receiving the Following Services: ACTT Designer, fashion/clothing Treatment) (Pt may be receiving ACTT services.)   Determination of Need: Urgent (48 hours)   Options For Referral: Inpatient Hospitalization     CCA Biopsychosocial Patient Reported Schizophrenia/Schizoaffective Diagnosis in Past: Yes   Strengths: No data recorded  Mental Health Symptoms Depression:   Difficulty Concentrating; Irritability; Hopelessness; Worthlessness; Sleep (too much or little); Tearfulness   Duration of Depressive symptoms:  Duration of Depressive Symptoms: Greater than two weeks   Mania:  No data recorded  Anxiety:    Difficulty concentrating; Worrying; Tension; Sleep   Psychosis:   Hallucinations; Grossly disorganized or catatonic behavior   Duration of Psychotic symptoms:  Duration of Psychotic Symptoms: Greater than six months   Trauma:   Avoids reminders of event   Obsessions:   None   Compulsions:   None   Inattention:   Does not seem to listen; Disorganized   Hyperactivity/Impulsivity:   None   Oppositional/Defiant Behaviors:   None   Emotional Irregularity:   None   Other Mood/Personality Symptoms:  No data recorded   Mental Status Exam Appearance and self-care  Stature:   Tall   Weight:   Overweight   Clothing:   Casual   Grooming:   Neglected   Cosmetic use:   None   Posture/gait:   Slumped   Motor activity:   Not  Remarkable   Sensorium  Attention:   Inattentive   Concentration:   Anxiety interferes; Scattered   Orientation:   -- (Not oriented to date or time)   Recall/memory:   Defective in Short-term   Affect and Mood  Affect:   Flat   Mood:   Depressed   Relating  Eye contact:   Avoided   Facial expression:   Depressed   Attitude toward examiner:   Passive   Thought and Language  Speech flow:  Garbled; Slurred; Soft   Thought content:   Delusions   Preoccupation:   Obsessions   Hallucinations:  No data recorded  Organization:  No data recorded  [employer] of Knowledge:   Average   Intelligence:   Average   Abstraction:  No data recorded  Judgement:   Poor; Impaired   Reality Testing:   Distorted   Insight:   None/zero insight; Unaware   Decision Making:  Impulsive   Social Functioning  Social Maturity:   Impulsive   Social Judgement:   Heedless   Stress  Stressors:   Other (Comment) (Not on meds lately.)   Coping Ability:   Overwhelmed; Exhausted; Deficient supports   Skill Deficits:   Decision making; Interpersonal; Self-control; Self-care   Supports:   Friends/Service system     Religion:    Leisure/Recreation:    Exercise/Diet: Exercise/Diet Have You Gained or Lost A Significant Amount of Weight in the Past Six Months?: No Do You Have Any Trouble Sleeping?:  (UTA)   CCA Employment/Education Employment/Work Situation: Employment / Work Situation Employment Situation: On disability Why is Patient on Disability: UTA How Long has Patient Been on Disability: UTA Patient's Job has Been Impacted by Current Illness: No Has Patient ever Been in the [employer]?: No  Education: Education Is Patient Currently Attending School?: No Did You Nutritional therapist?: No   CCA Family/Childhood History Family and Relationship History: Family history Marital status: Single Does patient have children?: Yes How many  children?: 3 How is patient's relationship with their children?: 3 boys. "old enough."   Childhood History:  Childhood History By whom was/is the patient raised?: Adoptive parents  Child/Adolescent Assessment:     CCA Substance Use Alcohol/Drug Use: Alcohol / Drug Use Pain Medications: Unknown Prescriptions: Unknown Over the Counter: Unknown History of alcohol / drug use?: No history of alcohol / drug abuse                         ASAM's:  Six Dimensions of Multidimensional Assessment  Dimension 1:  Acute Intoxication and/or Withdrawal Potential:      Dimension 2:  Biomedical Conditions and Complications:      Dimension 3:  Emotional, Behavioral, or Cognitive Conditions and Complications:     Dimension 4:  Readiness to Change:     Dimension 5:  Relapse, Continued use, or Continued Problem Potential:     Dimension 6:  Recovery/Living Environment:     ASAM Severity Score:    ASAM Recommended Level of Treatment:     Substance use Disorder (SUD)    Recommendations for Services/Supports/Treatments:    Discharge Disposition:    DSM5 Diagnoses: Patient Active Problem List   Diagnosis Date Noted   Hypersomnolence 10/30/2019   Schizophrenia ([place]) 10/17/2018   Post-operative state 10/07/2017   Disorganized schizophrenia ([place])    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[place])    Borderline intellectual functioning    Elevated WBC count 04/07/2014   Noncompliance with medication regimen 04/07/2014   Schizoaffective disorder-chronic with exacerbation ([place]) 02/06/2014   Aggressive behavior 08/16/2013   Papular rash, generalized 09/01/2012   Amenorrhea 04/25/2012   ASC-cannot exclude HGSIL on Pap 02/15/2012   Screening for malignant neoplasm of the cervix 02/01/2012   CONDYLOMA ACUMINATA, 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

## 2021-05-21 NOTE — ED Notes (Signed)
Pt compliant with med administration does have c/o sore throat.

## 2021-05-22 ENCOUNTER — Emergency Department (HOSPITAL_COMMUNITY): Payer: 59

## 2021-05-22 DIAGNOSIS — F259 Schizoaffective disorder, unspecified: Secondary | ICD-10-CM | POA: Diagnosis not present

## 2021-05-22 MED ORDER — PROPRANOLOL HCL 20 MG PO TABS: 20.0000 mg | ORAL_TABLET | Freq: Two times a day (BID) | ORAL | Status: DC

## 2021-05-22 MED ORDER — METOPROLOL TARTRATE 25 MG PO TABS
12.5000 mg | ORAL_TABLET | Freq: Two times a day (BID) | ORAL | Status: DC
  Administered 2021-05-22 – 2021-05-23 (×3): 12.5 mg via ORAL
  Filled 2021-05-22 (×3): qty 1

## 2021-05-22 MED ORDER — IOHEXOL 350 MG/ML SOLN
100.0000 mL | Freq: Once | INTRAVENOUS | Status: AC | PRN
  Administered 2021-05-22: 100 mL via INTRAVENOUS

## 2021-05-22 NOTE — Progress Notes (Signed)
Patient has been faxed out after being denied by [facility] due to no appropriate beds available. Patient meets [facility] inpatient criteria per Nira Conn, NP. Patient has been faxed out to multiple facilities.    Damita Dunnings, MSW, [name]-A  11:11 AM 05/22/2021

## 2021-05-22 NOTE — Progress Notes (Signed)
As of 7:57am per day shift [facility] AC Malva Limes, RN pt is under review. AS of 9:19pm per night shift [facility] AC Fransico Michael, RN once labs have resulted and pt is medically cleared then placement at [facility] will be reviewed again.   Maryjean Ka, MSW, LCSWA 05/22/2021 12:05 AM

## 2021-05-22 NOTE — ED Provider Notes (Signed)
Care assumed from Dr. Rush Landmark, patient is cleared for psychiatric treatment and pending placement, but getting CT angiogram because of tachycardia and elevated D-dimer.  CT angiogram shows no evidence of pulmonary embolism.  I have independently viewed the images and agree with radiologist interpretation.  Patient is considered medically cleared for psychiatric management.  Results for orders placed or performed during the hospital encounter of 05/20/21  Resp Panel by RT-PCR (Flu A&B, Covid) Nasopharyngeal Swab   Specimen: Nasopharyngeal Swab; Nasopharyngeal(NP) swabs in vial transport medium  Result Value Ref Range   SARS Coronavirus 2 by RT PCR NEGATIVE NEGATIVE   Influenza A by PCR NEGATIVE NEGATIVE   Influenza B by PCR NEGATIVE NEGATIVE  Comprehensive metabolic panel  Result Value Ref Range   Sodium 136 135 - 145 mmol/L   Potassium 3.4 (L) 3.5 - 5.1 mmol/L   Chloride 96 (L) 98 - 111 mmol/L   CO2 23 22 - 32 mmol/L   Glucose, Bld 91 70 - 99 mg/dL   BUN 17 6 - 20 mg/dL   Creatinine, Ser 8.75 (H) 0.44 - 1.00 mg/dL   Calcium 79.7 (H) 8.9 - 10.3 mg/dL   Total Protein 8.8 (H) 6.5 - 8.1 g/dL   Albumin 4.0 3.5 - 5.0 g/dL   AST 16 15 - 41 U/L   ALT 15 0 - 44 U/L   Alkaline Phosphatase 107 38 - 126 U/L   Total Bilirubin 0.8 0.3 - 1.2 mg/dL   GFR, Estimated 55 (L) >60 mL/min   Anion gap 17 (H) 5 - 15  CBC with Differential  Result Value Ref Range   WBC 17.5 (H) 4.0 - 10.5 K/uL   RBC 5.47 (H) 3.87 - 5.11 MIL/uL   Hemoglobin 15.1 (H) 12.0 - 15.0 g/dL   HCT 28.2 (H) 06.0 - 15.6 %   MCV 85.0 80.0 - 100.0 fL   MCH 27.6 26.0 - 34.0 pg   MCHC 32.5 30.0 - 36.0 g/dL   RDW 15.3 (H) 79.4 - 32.7 %   Platelets 467 (H) 150 - 400 K/uL   nRBC 0.0 0.0 - 0.2 %   Neutrophils Relative % 72 %   Neutro Abs 12.7 (H) 1.7 - 7.7 K/uL   Lymphocytes Relative 17 %   Lymphs Abs 3.0 0.7 - 4.0 K/uL   Monocytes Relative 9 %   Monocytes Absolute 1.5 (H) 0.1 - 1.0 K/uL   Eosinophils Relative 1 %   Eosinophils  Absolute 0.1 0.0 - 0.5 K/uL   Basophils Relative 0 %   Basophils Absolute 0.1 0.0 - 0.1 K/uL   Immature Granulocytes 1 %   Abs Immature Granulocytes 0.10 (H) 0.00 - 0.07 K/uL  Ethanol  Result Value Ref Range   Alcohol, Ethyl (B) <10 <10 mg/dL  Rapid urine drug screen (hospital performed)  Result Value Ref Range   Opiates NONE DETECTED NONE DETECTED   Cocaine NONE DETECTED NONE DETECTED   Benzodiazepines NONE DETECTED NONE DETECTED   Amphetamines NONE DETECTED NONE DETECTED   Tetrahydrocannabinol NONE DETECTED NONE DETECTED   Barbiturates NONE DETECTED NONE DETECTED  Valproic acid level  Result Value Ref Range   Valproic Acid Lvl <10 (L) 50.0 - 100.0 ug/mL  Lithium level  Result Value Ref Range   Lithium Lvl 0.43 (L) 0.60 - 1.20 mmol/L  CBC with Differential/Platelet  Result Value Ref Range   WBC 14.5 (H) 4.0 - 10.5 K/uL   RBC 4.83 3.87 - 5.11 MIL/uL   Hemoglobin 13.8 12.0 - 15.0 g/dL  HCT 40.7 36.0 - 46.0 %   MCV 84.3 80.0 - 100.0 fL   MCH 28.6 26.0 - 34.0 pg   MCHC 33.9 30.0 - 36.0 g/dL   RDW 15.6 (H) 11.5 - 15.5 %   Platelets 415 (H) 150 - 400 K/uL   nRBC 0.0 0.0 - 0.2 %   Neutrophils Relative % 70 %   Neutro Abs 10.1 (H) 1.7 - 7.7 K/uL   Lymphocytes Relative 20 %   Lymphs Abs 2.9 0.7 - 4.0 K/uL   Monocytes Relative 8 %   Monocytes Absolute 1.2 (H) 0.1 - 1.0 K/uL   Eosinophils Relative 1 %   Eosinophils Absolute 0.1 0.0 - 0.5 K/uL   Basophils Relative 0 %   Basophils Absolute 0.1 0.0 - 0.1 K/uL   Immature Granulocytes 1 %   Abs Immature Granulocytes 0.08 (H) 0.00 - 0.07 K/uL  D-dimer, quantitative  Result Value Ref Range   D-Dimer, Quant 3.91 (H) 0.00 - 0.50 ug/mL-FEU  I-Stat beta hCG blood, ED  Result Value Ref Range   I-stat hCG, quantitative <5.0 <5 mIU/mL   Comment 3          Troponin I (High Sensitivity)  Result Value Ref Range   Troponin I (High Sensitivity) 7 <18 ng/L   CT Angio Chest PE W and/or Wo Contrast  Result Date: 05/22/2021 CLINICAL DATA:   Pulmonary embolism (PE) suspected, positive D-dimer EXAM: CT ANGIOGRAPHY CHEST WITH CONTRAST TECHNIQUE: Multidetector CT imaging of the chest was performed using the standard protocol during bolus administration of intravenous contrast. Multiplanar CT image reconstructions and MIPs were obtained to evaluate the vascular anatomy. RADIATION DOSE REDUCTION: This exam was performed according to the departmental dose-optimization program which includes automated exposure control, adjustment of the mA and/or kV according to patient size and/or use of iterative reconstruction technique. CONTRAST:  170mL OMNIPAQUE IOHEXOL 350 MG/ML SOLN COMPARISON:  None. FINDINGS: Cardiovascular: Differential opacification of the aorta. Fair opacification of the pulmonary. No evidence of central or segmental pulmonary embolism. Limited evaluation of the subsegmental level due to timing of contrast. Normal heart size. No significant pericardial effusion. The thoracic aorta is normal in caliber. No atherosclerotic plaque of the thoracic aorta. No coronary artery calcifications. Mediastinum/Nodes: Asymmetrically slightly prominent but nonenlarged right axial lymph nodes with normal oval fatty hilar morphology. No definite axillary lymphadenopathy. No enlarged mediastinal or hilar lymph nodes. Thyroid gland and trachea demonstrate no significant findings. Nonspecific patulous distal esophagus with tiny hiatal hernia not excluded. Lungs/Pleura: No focal consolidation. No pulmonary nodule. No pulmonary mass. No pleural effusion. No pneumothorax. Upper Abdomen: No acute abnormality. Musculoskeletal: No chest wall abnormality. No suspicious lytic or blastic osseous lesions. No acute displaced fracture. Review of the MIP images confirms the above findings. IMPRESSION: 1. No central or segmental pulmonary embolus. Limited evaluation distally due to timing of contrast. 2. No acute intrathoracic abnormality. 3. Nonspecific patulous distal esophagus  with tiny hiatal hernia not excluded. Electronically Signed   By: Iven Finn M.D.   On: [id number]      Delora Fuel, MD [id number] [phone]

## 2021-05-22 NOTE — Progress Notes (Signed)
A secure chat sent to Carollee Herter, RN., asking if patient is able to participate in an assessment at this time.

## 2021-05-22 NOTE — ED Notes (Addendum)
Pt was accepted to [facility]. She will be ready for admitting 05/23/2021 09:00  Report can be called at [phone] or [phone]  Estill Cotta MD is accepting provider.

## 2021-05-22 NOTE — ED Notes (Signed)
Provided pt with two orange juices.

## 2021-05-22 NOTE — ED Notes (Signed)
Offered pt lunch. Pt declined. 

## 2021-05-22 NOTE — Progress Notes (Signed)
[facility] [name] Progress Note   05/22/2021    12:07 PM  Molly [name]   [id number]   Type of Contact and Topic:  Psychiatric Bed Placement   Pt accepted to [facility]     Patient meets inpatient criteria per Nira Conn, NP   The attending provider will be Estill Cotta, MD   Call report to [phone] or [phone]  Jess Barters, RN @ [facility] ED notified.     Pt scheduled  to arrive at [facility] after 0900.   Damita Dunnings, MSW, [name]-A  12:08 PM 05/22/2021

## 2021-05-22 NOTE — ED Notes (Signed)
Molly [name]  Work Cell [phone]

## 2021-05-22 NOTE — ED Provider Notes (Addendum)
Emergency Medicine Observation Re-evaluation Note  Molly [name] is a 36 y.o. female, seen on rounds today.  Pt initially presented to the ED for complaints of Psychiatric Evaluation Currently, the patient is sleeping.  Physical Exam  BP 126/87 (BP Location: Right Arm)    Pulse (!) 136    Temp 99.1 F (37.3 C) (Oral)    Resp 18    Ht 1.727 m (5\' 8" )    Wt 125 kg    SpO2 100%    BMI 41.90 kg/m  Physical Exam General: resting in bed Cardiac: tachycardic Lungs: breathing easily Psych: calm  ED Course / MDM  EKG:EKG Interpretation  Date/Time:  Wednesday May 20 2021 11:33:47 EST Ventricular Rate:  130 PR Interval:  131 QRS Duration: 68 QT Interval:  317 QTC Calculation: 467 R Axis:   57 Text Interpretation: Sinus tachycardia Borderline T abnormalities, diffuse leads Abnormal ECG Confirmed by 03-[birth year]-1990 [phone]) on 05/20/2021 12:42:40 PM  I have reviewed the labs performed to date as well as medications administered while in observation.  Recent changes in the last 24 hours include evaluation of chest pain.  Negative cardiac workup.  Negative pe.  Persistently tachycardic though.  Add on tsh, start low dose betablocker.  Plan  Current plan is for inpt treatment.  Will start beta blocker for the tachycardia.  Check tsh although not indications for thyroid storm, need for medical admission.  Discussed with pharmacy.  Will start with low dose lopressor.  Inderal will interact with clozapine potentially.  Molly [name] is not under involuntary commitment.     [name], MD 05/22/21 05/24/21    [id number], MD 05/22/21 [phone]

## 2021-05-22 NOTE — ED Notes (Signed)
RN attempted to wake up. Pt stated, "stop and rolled back under the covers."

## 2021-05-22 NOTE — ED Notes (Signed)
Pt breakfast at the bedside. Pt didn't want food at the time.

## 2021-05-23 DIAGNOSIS — F259 Schizoaffective disorder, unspecified: Secondary | ICD-10-CM | POA: Diagnosis not present

## 2021-05-23 NOTE — Progress Notes (Signed)
Per Juel Burrow, RN, pt has been accepted to [facility]. Accepting provider is Dr. Jonelle Sports. Patient can arrive 05/23/2021 after 9:00am. Number for report is [phone] or [phone].   Glennie Isle, MSW, [name]-A Phone: [phone] Disposition/TOC

## 2021-05-23 NOTE — ED Notes (Addendum)
Safe transport gave ETA of 2 hours from now to pick up patient.   Safe transport will call when on the way.

## 2021-05-23 NOTE — ED Notes (Signed)
Report given to [place], Charity fundraiser at [facility] at this time.

## 2021-05-23 NOTE — ED Provider Notes (Signed)
Emergency Medicine Observation Re-evaluation Note  Molly [name] is a 36 y.o. female, seen on rounds today.  Pt initially presented to the ED for complaints of Psychiatric Evaluation Currently, the patient is resting quietly in bed.  Physical Exam  BP 120/88    Pulse (!) 115    Temp 98.6 F (37 C)    Resp 14    Ht 5\' 8"  (1.727 m)    Wt 125 kg    SpO2 99%    BMI 41.90 kg/m  Physical Exam General: Nontoxic. Cardiac: Tachycardic Lungs: No respiratory distress Psych: No apparent internal responsiveness  ED Course / MDM  EKG:EKG Interpretation  Date/Time:  Wednesday May 20 2021 11:33:47 EST Ventricular Rate:  130 PR Interval:  131 QRS Duration: 68 QT Interval:  317 QTC Calculation: 467 R Axis:   57 Text Interpretation: Sinus tachycardia Borderline T abnormalities, diffuse leads Abnormal ECG Confirmed by 03-11-1990 [phone]) on 05/20/2021 12:42:40 PM  I have reviewed the labs performed to date as well as medications administered while in observation.  Recent changes in the last 24 hours include she has been persistently tachycardic.  TSH was ordered but not drawn yet.  I have asked the nurse to draw that.  Patient is being transferred today, the TSH result can be followed up on at the psychiatric hospital, [facility].  Plan  Current plan is for transfer.  Patient is stable, tolerating medications with appropriate response.  Tachycardia improved since started on Lopressor.  Molly [name] is not under involuntary commitment.     [name], MD 05/23/21 [phone]

## 2021-05-23 NOTE — ED Notes (Signed)
Attempted to call report to [facility] at this time. Nurse not available

## 2021-05-26 LAB — CLOZAPINE (CLOZARIL)
Clozapine Lvl: 457 ng/mL (ref 350–650)
NorClozapine: 303 ng/mL
Total(Cloz+Norcloz): 760 ng/mL

## 2021-06-22 ENCOUNTER — Encounter (HOSPITAL_COMMUNITY): Payer: Self-pay

## 2021-06-22 ENCOUNTER — Other Ambulatory Visit: Payer: Self-pay

## 2021-06-22 ENCOUNTER — Emergency Department (HOSPITAL_COMMUNITY)
Admission: EM | Admit: 2021-06-22 | Discharge: 2021-06-22 | Disposition: A | Discharge status: Home / Self Care | Payer: 59 | Attending: Emergency Medicine | Admitting: Emergency Medicine

## 2021-06-22 DIAGNOSIS — R209 Unspecified disturbances of skin sensation: Secondary | ICD-10-CM | POA: Insufficient documentation

## 2021-06-22 DIAGNOSIS — R110 Nausea: Secondary | ICD-10-CM | POA: Diagnosis present

## 2021-06-22 DIAGNOSIS — T50995A Adverse effect of other drugs, medicaments and biological substances, initial encounter: Secondary | ICD-10-CM | POA: Insufficient documentation

## 2021-06-22 DIAGNOSIS — R410 Disorientation, unspecified: Secondary | ICD-10-CM | POA: Diagnosis not present

## 2021-06-22 DIAGNOSIS — T50905A Adverse effect of unspecified drugs, medicaments and biological substances, initial encounter: Secondary | ICD-10-CM

## 2021-06-22 NOTE — Progress Notes (Signed)
TOC CSW received consult , however pt eft AMA before TOC depart can speak with her. TOC sign Off. Valentina Shaggy.Shedrick Sarli, MSW, LCSWA [facility]   Transitions of Care Clinical Social Worker I Direct Dial: [phone]   Fax: [phone] [email or web address].

## 2021-06-22 NOTE — ED Provider Notes (Signed)
[facility]-EMERGENCY DEPT Provider Note   CSN: [id number] Arrival date & time: 06/22/21  1140     History  Chief Complaint  Patient presents with   Nausea   Numbness    Molly [name] is a 36 y.o. female Is a 36 year old female with past medical history of schizophrenia, schizoaffective disorder, intellectual disability who presents with report that she was being seen by a Child psychotherapist, was handed the wrong medication instead of her melatonin, she felt some nausea and numbness initially but has none of the symptoms remaining at this time.  Discussed I do not have concerns if patient is feeling well, however while attempting to discharge patient she reports that she needs help with transportation, and food resources at home, request to speak to a Child psychotherapist.    HPI     Home Medications Prior to Admission medications   Medication Sig Start Date End Date Taking? Authorizing Provider  albuterol (PROVENTIL HFA;VENTOLIN HFA) 108 (90 Base) MCG/ACT inhaler Inhale 1 puff into the lungs every 6 (six) hours as needed for wheezing or shortness of breath. Patient not taking: Reported on 05/20/2021 03/20/18   Eustace Moore, MD  cloZAPine (CLOZARIL) 100 MG tablet Take 3 tablets (300 mg total) by mouth 2 (two) times daily. Patient taking differently: Take 100 mg by mouth every morning. 05/29/16   Charm Rings, NP  clozapine (CLOZARIL) 200 MG tablet Take 200 mg by mouth at bedtime. 04/29/21   [provider]  divalproex (DEPAKOTE ER) 500 MG 24 hr tablet Take 1,500 mg by mouth daily. Patient not taking: Reported on 05/20/2021    [provider]  folic acid (FOLVITE) 1 MG tablet Take 1 mg by mouth every morning. 04/29/21   [provider]  lithium carbonate (ESKALITH) 450 MG CR tablet Take 450 mg by mouth in the morning. 04/29/21   [provider]  lithium carbonate (LITHOBID) 300 MG CR tablet Take 600 mg by mouth at bedtime. 04/29/21    [provider]  melatonin 3 MG TABS tablet Take 6 mg by mouth at bedtime.    [provider]  paliperidone (INVEGA) 6 MG 24 hr tablet Take 12 mg by mouth in the morning. 04/29/21   [provider]      Allergies    Abilify [aripiprazole]    Review of Systems   Review of Systems  All other systems reviewed and are negative.  Physical Exam Updated Vital Signs BP (!) 134/97 (BP Location: Right Arm)    Pulse (!) 127    Temp 98.3 F (36.8 C) (Oral)    Resp 18    Ht 5\' 8"  (1.727 m)    SpO2 100%    BMI 41.90 kg/m  Physical Exam Vitals and nursing note reviewed.  Constitutional:      General: She is not in acute distress.    Appearance: Normal appearance.     Comments: Somewhat disheveled appearance but in no acute distress  HENT:     Head: Normocephalic and atraumatic.  Eyes:     General:        Right eye: No discharge.        Left eye: No discharge.  Cardiovascular:     Rate and Rhythm: Normal rate and regular rhythm.  Pulmonary:     Effort: Pulmonary effort is normal. No respiratory distress.  Musculoskeletal:        General: No deformity.  Skin:    General: Skin is  warm and dry.  Neurological:     Mental Status: She is alert. She is disoriented.     Comments: Denies SI/HI/AVH  Psychiatric:        Mood and Affect: Mood normal.        Behavior: Behavior normal.    ED Results / Procedures / Treatments   Labs (all labs ordered are listed, but only abnormal results are displayed) Labs Reviewed - No data to display  EKG None  Radiology No results found.  Procedures Procedures    Medications Ordered in ED Medications - No data to display  ED Course/ Medical Decision Making/ A&P                           Medical Decision Making  Establish care with this patient who reported some side effects after being given the wrong medication, she could not tell me what medication she was supposed to receive or what medication she received in  place of this, but she reports that she feels well at this time.  Attempted to discharge the patient, but she reports that she needs help with transportation, and food resources.  Discussed we will consult social work.  Prior to social work consult patient requested to leave without seeing social work.  She had denied any SI/HI when I spoke to patient.  She was not under IVC order, believe she was stable to leave of her own accord.  She was provided bus pass by nursing staff prior to elopement. Final Clinical Impression(s) / ED Diagnoses Final diagnoses:  Adverse effect of drug, initial encounter    Rx / DC Orders ED Discharge Orders     None         [place] 06/22/21 1301    Cheryll Cockayne, MD 06/28/21 [phone]

## 2021-06-22 NOTE — ED Notes (Signed)
Pt approached the nursing station asking if she could leave.  Pt not IVC informed she could do as she pleased.  Pt stated she wanted to leave and asked for a bus pass.  Bus pass provided. Pt ambulatory out of department.

## 2021-06-22 NOTE — ED Triage Notes (Signed)
Per EMS- Patient reports that she was handed the wrong medication from the social worker. Patient c/o nausea and numbness, but doesn't know what it was.

## 2021-08-19 ENCOUNTER — Encounter (HOSPITAL_COMMUNITY): Payer: Self-pay | Admitting: Registered Nurse

## 2021-08-19 ENCOUNTER — Ambulatory Visit (HOSPITAL_COMMUNITY)
Admission: EM | Admit: 2021-08-19 | Discharge: 2021-08-19 | Disposition: A | Discharge status: Home / Self Care | Payer: 59 | Attending: Registered Nurse | Admitting: Registered Nurse

## 2021-08-19 DIAGNOSIS — R4183 Borderline intellectual functioning: Secondary | ICD-10-CM | POA: Insufficient documentation

## 2021-08-19 DIAGNOSIS — F259 Schizoaffective disorder, unspecified: Secondary | ICD-10-CM | POA: Diagnosis present

## 2021-08-19 DIAGNOSIS — Z046 Encounter for general psychiatric examination, requested by authority: Secondary | ICD-10-CM | POA: Diagnosis not present

## 2021-08-19 DIAGNOSIS — F250 Schizoaffective disorder, bipolar type: Secondary | ICD-10-CM | POA: Insufficient documentation

## 2021-08-19 DIAGNOSIS — R4689 Other symptoms and signs involving appearance and behavior: Secondary | ICD-10-CM

## 2021-08-19 NOTE — ED Notes (Signed)
Patient discharged by NP, given community resources. Denies SI, HI and AVH at time of discharge. ? ?

## 2021-08-19 NOTE — ED Provider Notes (Signed)
Behavioral Health Urgent Care Medical Screening Exam ? ?Patient Name: Molly [name] ?MRN: [id number] ?Date of Evaluation: 08/19/21 ?Chief Complaint:   ?Diagnosis:  ?Final diagnoses:  ?Schizoaffective disorder, bipolar type (HCC)  ?Involuntary commitment  ?Borderline intellectual functioning  ?Aggressive behavior  ? ? ?History of Present illness: Molly [name] is a 36 y.o. female patient presented to [facility] via  [facility] deputy under [employer]  petitioned by Orion Modest social worker per IVC:   ?Molly [name], 36 y.o., female patient seen face to face by this provider, consulted with Dr. Earlene Plater; and chart reviewed on 08/19/21.  On evaluation Molly [name] reports she was sent here by her Child psychotherapist.  Patient states that social worker "called the police and they brought me here."  Patient denies suicidal/self-harm/homicidal ideation, psychosis, and paranoia.  Patient asked about putting the trash bag on for clothing and states that she did do that but took it off.  States that her brother has worn one before and that he makes dresses out of the trash bag.  She states she is eating and sleeping without difficulty.  She reports she has an [facility] with [facility] and they come to her home once a week.  Patient states she last saw ACTT yesterday.   ?During evaluation Molly [name] is sitting up in chair in no acute distress.  She is alert/oriented x 3; calm/cooperative; but becomes agitated and starts yelling when she thinks she is going to be held here against her will.  He mood is irritable.  She is speaking in a clear tone at moderate volume, and normal pace; with good eye contact.  Her thought process is coherent and relevant; There is no indication that she is currently responding to internal/external stimuli or experiencing delusional thought content; and she has denies  suicidal/self-harm/homicidal ideation, psychosis, and paranoia.  Patient has remained calm throughout  assessment and has answered questions appropriately.   ? ?Collateral Information: ? ?Spoke with DSS social worker Molly [name] who reports that patient does have ACTT team and she was seen yesterday.  Reports that patient has refused to take her Clozaril and has gotten aggressive.  States she was recently kicked out of her apartment and was put in a motel room but was walking around in a clear plastic bag as clothing and was put out of motel.  "I probably shouldn't say this but this is more behavioral and it's like a revolving door; just repetitive with her.  I understand that she can't be held if there are no grounds to hold her on."  States that Herold Harms is her legal guardian and she will get in touch with her to see if any other arrangements for housing have been made for her and give this Clinical research associate a call back.  States more likely patient will need a bus pass.   ? ?Spoke with Guardian: Molly [name] who states that she has been having trouble finding place for patient to stay since she was kicked out of her apartment.  States while in the motel she kept going to the front desk with questions and when she went down in the trash bag they said she had to go.  States that the room was trashed and junked up and that patient may not even get money back.  States she was trying to keep hr out of the shelter but that may be where she ends up.  States that patient is able to leave  with a bus ticket to go to shelter.   ? ? ? ?Psychiatric Specialty Exam ? ?Presentation  ?General Appearance:Appropriate for Environment ? ?Eye Contact:Fair ? ?Speech:Clear and Coherent; Slow ? ?Speech Volume:Increased ? ?Handedness:Right ? ? ?Mood and Affect  ?Mood:Irritable ? ?Affect:Congruent ? ? ?Thought Process  ?Thought Processes:Coherent ? ?Descriptions of Associations:Circumstantial ? ?Orientation:Full (Time, Place and Person) ? ?Thought Content:Rumination ? Diagnosis of Schizophrenia or Schizoaffective disorder in past: Yes ?  Duration of Psychotic Symptoms: Greater than six months ? Hallucinations:None ? ?Ideas of Reference:None ? ?Suicidal Thoughts:No ? ?Homicidal Thoughts:No ? ? ?Sensorium  ?Memory:Immediate Fair; Recent Fair ? ?Judgment:Fair ? ?Insight:Fair ? ? ?Executive Functions  ?Concentration:Fair ? ?Attention Span:Fair ? ?Recall:Fair ? ?Fund of Knowledge:Fair ? ?Language:Fair ? ? ?Psychomotor Activity  ?Psychomotor Activity:Normal ? ? ?Assets  ?Assets:Communication Skills; Financial Resources/Insurance; Housing ? ? ?Sleep  ?Sleep:Good ? ?Number of hours: No data recorded ? ?Nutritional Assessment (For OBS and FBC admissions only) ?Has the patient had a weight loss or gain of 10 pounds or more in the last 3 months?: No ?Has the patient had a decrease in food intake/or appetite?: No ?Does the patient have dental problems?: No ?Does the patient have eating habits or behaviors that may be indicators of an eating disorder including binging or inducing vomiting?: No ?Has the patient recently lost weight without trying?: 0 ?Has the patient been eating poorly because of a decreased appetite?: 0 ?Malnutrition Screening Tool Score: 0 ? ? ? ?Physical Exam: ?Physical Exam ?Vitals and nursing note reviewed. Exam conducted with a chaperone present.  ?Constitutional:   ?   General: She is not in acute distress. ?   Appearance: Normal appearance. She is not ill-appearing.  ?Cardiovascular:  ?   Rate and Rhythm: Normal rate.  ?Pulmonary:  ?   Effort: Pulmonary effort is normal.  ?Skin: ?   General: Skin is warm and dry.  ?Neurological:  ?   Mental Status: She is alert and oriented to person, place, and time.  ?Psychiatric:     ?   Attention and Perception: Attention and perception normal. She does not perceive auditory or visual hallucinations.     ?   Mood and Affect: Affect is angry.     ?   Speech: Speech is delayed.  ? ?Review of Systems  ?Constitutional: Negative.   ?HENT: Negative.    ?Eyes: Negative.   ?Respiratory: Negative.     ?Cardiovascular: Negative.   ?Gastrointestinal: Negative.   ?Genitourinary: Negative.   ?Musculoskeletal: Negative.   ?Skin: Negative.   ?Neurological: Negative.   ?Endo/Heme/Allergies: Negative.   ?Psychiatric/Behavioral:  Negative for depression, hallucinations, substance abuse and suicidal ideas. The patient is not nervous/anxious and does not have insomnia.   ?     States she was sent here by her Child psychotherapistsocial worker and "I don't need to be here.  She should have been woman enough to tell me to my face instead of calling the police.   I don't need to be here."   ?Blood pressure (!) 128/92, pulse (!) 124, temperature 98.6 ?F (37 ?C), temperature source Oral, resp. rate 19, SpO2 100 %, unknown if currently breastfeeding. There is no height or weight on file to calculate BMI. ? ?Musculoskeletal: ?Strength & Muscle Tone: within normal limits ?Gait & Station: normal ?Patient leans: N/A ? ? ?[facility] for Follow up and Recommendations: ?Based on my evaluation the patient does not appear to have an emergency medical condition and can be discharged with resources and  follow up care in outpatient services for Medication Management and Follow up with [facility] ACTT ? ?Shelter resources added to discharge instructions ? ? ?Maygan Koeller, NP ?08/19/2021, 4:38 PM ? ?

## 2021-08-22 ENCOUNTER — Emergency Department (HOSPITAL_COMMUNITY)
Admission: EM | Admit: 2021-08-22 | Discharge: 2021-08-28 | Disposition: A | Discharge status: Other Acute Inpatient Hospital | Payer: 59 | Attending: Emergency Medicine | Admitting: Emergency Medicine

## 2021-08-22 ENCOUNTER — Encounter (HOSPITAL_COMMUNITY): Payer: Self-pay

## 2021-08-22 DIAGNOSIS — Z20822 Contact with and (suspected) exposure to covid-19: Secondary | ICD-10-CM | POA: Diagnosis not present

## 2021-08-22 DIAGNOSIS — Z046 Encounter for general psychiatric examination, requested by authority: Secondary | ICD-10-CM | POA: Diagnosis present

## 2021-08-22 DIAGNOSIS — F259 Schizoaffective disorder, unspecified: Secondary | ICD-10-CM | POA: Diagnosis not present

## 2021-08-22 DIAGNOSIS — Y900 Blood alcohol level of less than 20 mg/100 ml: Secondary | ICD-10-CM | POA: Insufficient documentation

## 2021-08-22 DIAGNOSIS — F29 Unspecified psychosis not due to a substance or known physiological condition: Secondary | ICD-10-CM | POA: Insufficient documentation

## 2021-08-22 DIAGNOSIS — Z79899 Other long term (current) drug therapy: Secondary | ICD-10-CM | POA: Insufficient documentation

## 2021-08-22 LAB — CBC WITH DIFFERENTIAL/PLATELET
Abs Immature Granulocytes: 0.07 10*3/uL (ref 0.00–0.07)
Basophils Absolute: 0 10*3/uL (ref 0.0–0.1)
Basophils Relative: 0 %
Eosinophils Absolute: 0.2 10*3/uL (ref 0.0–0.5)
Eosinophils Relative: 1 %
HCT: 35.9 % — ABNORMAL LOW (ref 36.0–46.0)
Hemoglobin: 11.6 g/dL — ABNORMAL LOW (ref 12.0–15.0)
Immature Granulocytes: 1 %
Lymphocytes Relative: 21 %
Lymphs Abs: 3 10*3/uL (ref 0.7–4.0)
MCH: 28.9 pg (ref 26.0–34.0)
MCHC: 32.3 g/dL (ref 30.0–36.0)
MCV: 89.3 fL (ref 80.0–100.0)
Monocytes Absolute: 1.1 10*3/uL — ABNORMAL HIGH (ref 0.1–1.0)
Monocytes Relative: 8 %
Neutro Abs: 9.6 10*3/uL — ABNORMAL HIGH (ref 1.7–7.7)
Neutrophils Relative %: 69 %
Platelets: 373 10*3/uL (ref 150–400)
RBC: 4.02 MIL/uL (ref 3.87–5.11)
RDW: 15.7 % — ABNORMAL HIGH (ref 11.5–15.5)
WBC: 14 10*3/uL — ABNORMAL HIGH (ref 4.0–10.5)
nRBC: 0 % (ref 0.0–0.2)

## 2021-08-22 LAB — COMPREHENSIVE METABOLIC PANEL
ALT: 16 U/L (ref 0–44)
AST: 40 U/L (ref 15–41)
Albumin: 3.7 g/dL (ref 3.5–5.0)
Alkaline Phosphatase: 111 U/L (ref 38–126)
Anion gap: 6 (ref 5–15)
BUN: 9 mg/dL (ref 6–20)
CO2: 26 mmol/L (ref 22–32)
Calcium: 8.8 mg/dL — ABNORMAL LOW (ref 8.9–10.3)
Chloride: 106 mmol/L (ref 98–111)
Creatinine, Ser: 0.72 mg/dL (ref 0.44–1.00)
GFR, Estimated: >60 mL/min (ref >60)
Glucose, Bld: 100 mg/dL — ABNORMAL HIGH (ref 70–99)
Potassium: 3.3 mmol/L — ABNORMAL LOW (ref 3.5–5.1)
Sodium: 138 mmol/L (ref 135–145)
Total Bilirubin: 0.8 mg/dL (ref 0.3–1.2)
Total Protein: 7.4 g/dL (ref 6.5–8.1)

## 2021-08-22 LAB — RESP PANEL BY RT-PCR (FLU A&B, COVID) ARPGX2
Influenza A by PCR: NEGATIVE
Influenza B by PCR: NEGATIVE
SARS Coronavirus 2 by RT PCR: NEGATIVE

## 2021-08-22 LAB — ACETAMINOPHEN LEVEL: Acetaminophen (Tylenol), Serum: <10 ug/mL — ABNORMAL LOW (ref 10–30)

## 2021-08-22 LAB — ETHANOL: Alcohol, Ethyl (B): <10 mg/dL (ref <10)

## 2021-08-22 LAB — SALICYLATE LEVEL: Salicylate Lvl: <7 mg/dL — ABNORMAL LOW (ref 7.0–30.0)

## 2021-08-22 LAB — I-STAT BETA HCG BLOOD, ED (MC, WL, AP ONLY): I-stat hCG, quantitative: <5 m[IU]/mL (ref <5)

## 2021-08-22 MED ORDER — RISPERIDONE 0.5 MG PO TBDP
2.0000 mg | ORAL_TABLET | Freq: Three times a day (TID) | ORAL | Status: DC | PRN
  Administered 2021-08-22 – 2021-08-26 (×7): 2 mg via ORAL
  Filled 2021-08-22 (×8): qty 4

## 2021-08-22 MED ORDER — LORAZEPAM 1 MG PO TABS
1.0000 mg | ORAL_TABLET | ORAL | Status: AC | PRN
  Administered 2021-08-22: 1 mg via ORAL
  Filled 2021-08-22: qty 1

## 2021-08-22 MED ORDER — ZIPRASIDONE MESYLATE 20 MG IM SOLR
20.0000 mg | Freq: Once | INTRAMUSCULAR | Status: AC
  Administered 2021-08-22: 20 mg via INTRAMUSCULAR
  Filled 2021-08-22: qty 20

## 2021-08-22 MED ORDER — ZIPRASIDONE MESYLATE 20 MG IM SOLR
20.0000 mg | INTRAMUSCULAR | Status: AC | PRN
  Administered 2021-08-22: 20 mg via INTRAMUSCULAR
  Filled 2021-08-22: qty 20

## 2021-08-22 MED ORDER — STERILE WATER FOR INJECTION IJ SOLN
INTRAMUSCULAR | Status: AC
  Administered 2021-08-22: 10 mL
  Filled 2021-08-22: qty 10

## 2021-08-22 MED ORDER — ALUM & MAG HYDROXIDE-SIMETH 200-200-20 MG/5ML PO SUSP: 30.0000 mL | Freq: Four times a day (QID) | ORAL | Status: DC | PRN

## 2021-08-22 MED ORDER — LORAZEPAM 2 MG/ML IJ SOLN
2.0000 mg | Freq: Once | INTRAMUSCULAR | Status: DC
  Filled 2021-08-22 (×2): qty 1

## 2021-08-22 MED ORDER — DIPHENHYDRAMINE HCL 50 MG/ML IJ SOLN
50.0000 mg | Freq: Once | INTRAMUSCULAR | Status: AC
  Administered 2021-08-22: 50 mg via INTRAMUSCULAR
  Filled 2021-08-22: qty 1

## 2021-08-22 MED ORDER — STERILE WATER FOR INJECTION IJ SOLN
INTRAMUSCULAR | Status: AC
  Filled 2021-08-22: qty 10

## 2021-08-22 MED ORDER — ACETAMINOPHEN 325 MG PO TABS
650.0000 mg | ORAL_TABLET | ORAL | Status: DC | PRN
  Administered 2021-08-27: 650 mg via ORAL
  Filled 2021-08-22: qty 2

## 2021-08-22 MED ORDER — ONDANSETRON HCL 4 MG PO TABS: 4.0000 mg | ORAL_TABLET | Freq: Three times a day (TID) | ORAL | Status: DC | PRN

## 2021-08-22 MED ORDER — LORAZEPAM 2 MG/ML IJ SOLN
2.0000 mg | Freq: Once | INTRAMUSCULAR | Status: AC
  Administered 2021-08-22: 2 mg via INTRAMUSCULAR

## 2021-08-22 NOTE — ED Notes (Signed)
Pt dressed out into burgundy scrubs 

## 2021-08-22 NOTE — ED Notes (Signed)
Patient becoming increasingly agitated. Patient refusing vitals. Patient swinging with hands at staff members. Patient refusing to change into burgundy scrubs. PA is aware. ?

## 2021-08-22 NOTE — ED Notes (Addendum)
Patient still agitated at this time. Patient crying inconsolably asking for more medication. Patient keeps asking asking for her paperwork and yelling at security. Patient keeps getting out of bed and is restless. Pt's ED provider aware. ?

## 2021-08-22 NOTE — ED Notes (Signed)
Patient is delusional at this time, yelling at nurse and security that we gave her AIDS. ?

## 2021-08-22 NOTE — ED Notes (Signed)
Patient continues to attempt to remove self from restraints.  Has removed one wrist restraints at this time ?

## 2021-08-22 NOTE — ED Notes (Signed)
Pt. Pacing back and forth to the bathroom, pt. Redirected back to her room. ?

## 2021-08-22 NOTE — ED Notes (Signed)
Unable to do ekg will try again later ?

## 2021-08-22 NOTE — Consult Note (Signed)
Psychiatry cannot evaluate patient at this time as she remains disorganized and requiring care and extra medications.  Will see patient when she is calm and able to participate in an interview.  ? ?

## 2021-08-22 NOTE — ED Notes (Addendum)
Security and staff assisted patient to change into scrubs. Patient uncooperative, attempting to kick and hit staff. Patient belongings removed from room and cabinets locked. Patient provided with meal tray. ?

## 2021-08-22 NOTE — ED Notes (Signed)
Patient continues to remain uncooperative.  Crying loudly, throwing food, will not follow commands.  Patient has been redirected multiple times.   ?

## 2021-08-22 NOTE — ED Provider Notes (Signed)
?[facility]-EMERGENCY DEPT ?Provider Note ? ? ?CSN: [id number] ?Arrival date & time: 08/22/21  1035 ? ?  ? ?History ? ?Chief Complaint  ?Patient presents with  ? Psychiatric Evaluation  ? ? ?Molly [name] is a 36 y.o. female. ? ?36 year old female [place] dep[place].  History provided primarily by the police who state they were called to the [place] as patient was approaching other people and saying that she was being followed by someone. Patient was acting bizarre on BP arrival, paranoid- would not talk to the officers outside of their car, asked to be taken to the hospital. Once inside the police vehicle, reported she was being followed. On arrival in the ER, patient is writing on paper, asked for her finger to be pricked to have her blood checked for "HIV and everything possible." Patient swatting at staff while attempting to obtain vitals, then began throwing things at staff. IVC requested for concern for safety of patient and others.  ? ? ?  ? ?Home Medications ?Prior to Admission medications   ?Medication Sig Start Date End Date Taking? Authorizing Provider  ?albuterol (PROVENTIL HFA;VENTOLIN HFA) 108 (90 Base) MCG/ACT inhaler Inhale 1 puff into the lungs every 6 (six) hours as needed for wheezing or shortness of breath. ?Patient not taking: Reported on 05/20/2021 03/20/18   Eustace MooreNelson, Yvonne Sue, MD  ?cloZAPine (CLOZARIL) 100 MG tablet Take 3 tablets (300 mg total) by mouth 2 (two) times daily. ?Patient taking differently: Take 100 mg by mouth every morning. 05/29/16   Charm RingsLord, Jamison Y, NP  ?clozapine (CLOZARIL) 200 MG tablet Take 200 mg by mouth at bedtime. 04/29/21   [provider]  ?divalproex (DEPAKOTE ER) 500 MG 24 hr tablet Take 1,500 mg by mouth daily. ?Patient not taking: Reported on 05/20/2021    [provider]  ?folic acid (FOLVITE) 1 MG tablet Take 1 mg by mouth every morning. 04/29/21   [provider]  ?lithium carbonate (ESKALITH) 450  MG CR tablet Take 450 mg by mouth in the morning. 04/29/21   [provider]  ?lithium carbonate (LITHOBID) 300 MG CR tablet Take 600 mg by mouth at bedtime. 04/29/21   [provider]  ?melatonin 3 MG TABS tablet Take 6 mg by mouth at bedtime.    [provider]  ?paliperidone (INVEGA) 6 MG 24 hr tablet Take 12 mg by mouth in the morning. 04/29/21   [provider]  ?   ? ?Allergies    ?Abilify [aripiprazole]   ? ?Review of Systems   ?Review of Systems  ?Unable to perform ROS: Psychiatric disorder  ? ?Physical Exam ?Updated Vital Signs ?BP (!) 162/117 (BP Location: Left Arm)   Pulse (!) 108   Temp 97.9 ?F (36.6 ?C)   Resp 16   LMP  (LMP Unknown)   SpO2 100%  ?Physical Exam ?Vitals and nursing note reviewed.  ?Constitutional:   ?   General: She is not in acute distress. ?   Appearance: She is well-developed. She is not diaphoretic.  ?HENT:  ?   Head: Normocephalic and atraumatic.  ?Pulmonary:  ?   Effort: Pulmonary effort is normal.  ?Neurological:  ?   Mental Status: She is alert.  ?   Comments: Moves extremities equally, speech clear  ?Psychiatric:     ?   Behavior: Behavior is agitated and aggressive.     ?   Thought Content: Thought content is paranoid.  ? ? ?ED Results /  Procedures / Treatments   ?Labs ?(all labs ordered are listed, but only abnormal results are displayed) ?Labs Reviewed  ?COMPREHENSIVE METABOLIC PANEL - Abnormal; Notable for the following components:  ?    Result Value  ? Potassium 3.3 (*)   ? Glucose, Bld 100 (*)   ? Calcium 8.8 (*)   ? All other components within normal limits  ?CBC WITH DIFFERENTIAL/PLATELET - Abnormal; Notable for the following components:  ? WBC 14.0 (*)   ? Hemoglobin 11.6 (*)   ? HCT 35.9 (*)   ? RDW 15.7 (*)   ? Neutro Abs 9.6 (*)   ? Monocytes Absolute 1.1 (*)   ? All other components within normal limits  ?ACETAMINOPHEN LEVEL - Abnormal; Notable for the following components:  ? Acetaminophen (Tylenol), Serum <10 (*)   ? All  other components within normal limits  ?SALICYLATE LEVEL - Abnormal; Notable for the following components:  ? Salicylate Lvl <7.0 (*)   ? All other components within normal limits  ?RESP PANEL BY RT-PCR (FLU A&B, COVID) ARPGX2  ?ETHANOL  ?RAPID URINE DRUG SCREEN, HOSP PERFORMED  ?LITHIUM LEVEL  ?I-STAT BETA HCG BLOOD, ED ([facility], [facility], [facility] ONLY)  ? ? ?EKG ?None ? ?Radiology ?No results found. ? ?Procedures ?Marland KitchenCritical Care ?Performed by: Jeannie Fend, PA-C ?Authorized by: Jeannie Fend, PA-C  ? ?Critical care provider statement:  ?  Critical care time (minutes):  30 ?  Critical care was time spent personally by me on the following activities:  Development of treatment plan with patient or surrogate, discussions with consultants, evaluation of patient's response to treatment, examination of patient, ordering and review of laboratory studies, ordering and review of radiographic studies, ordering and performing treatments and interventions, pulse oximetry, re-evaluation of patient's condition and review of old charts  ? ? ?Medications Ordered in ED ?Medications  ?risperiDONE (RISPERDAL M-TABS) disintegrating tablet 2 mg (2 mg Oral Given 08/22/21 1239)  ?  And  ?LORazepam (ATIVAN) tablet 1 mg (1 mg Oral Given 08/22/21 1239)  ?  And  ?ziprasidone (GEODON) injection 20 mg (20 mg Intramuscular Given 08/22/21 1132)  ?acetaminophen (TYLENOL) tablet 650 mg (has no administration in time range)  ?ondansetron (ZOFRAN) tablet 4 mg (has no administration in time range)  ?alum & mag hydroxide-simeth (MAALOX/MYLANTA) 200-200-20 MG/5ML suspension 30 mL (has no administration in time range)  ?ziprasidone (GEODON) injection 20 mg (has no administration in time range)  ?sterile water (preservative free) injection (has no administration in time range)  ?sterile water (preservative free) injection (  Given 08/22/21 1131)  ? ? ?ED Course/ Medical Decision Making/ A&P ?Clinical Course as of 08/22/21 1754  ?Sat Aug 22, 2021  ?1531 Patient is  medically cleared for behavioral health evaluation and disposition. [LM]  ?  ?Clinical Course User Index ?[LM] Jeannie Fend, PA-C  ? ?                        ?Medical Decision Making ?Amount and/or Complexity of Data Reviewed ?Labs: ordered. ? ?Risk ?OTC drugs. ?Prescription drug management. ? ? ?36 year old female with past medical history of schizophrenia, depression, diabetes (reportedly diet controlled), hypertension brought in by police [place] people that she was being followed.  Patient paranoid with bizarre behavior for the place, arrives in the ER, agreed to a fingerstick for an HIV test.  Advised patient we could draw blood to expand for more blood testing, she is  agreeable with this however she is swatting at staff and then began throwing things at the staff.  Patient was placed under IVC for safety of self and others.  She was given Geodon and p.o. Ativan.  Patient was able to have her labs obtained, no significant findings on CBC, CMP.  Aspirin and Tylenol levels negative, alcohol negative, UDS needs to be collected as of yet.  Unfortunately, patient woke up in the department, peed in the corner of the room, all over her bed and is soaked in urine.  Patient refuses to have her wet clothes removed, continues to try to walk out of the room.  Additional medication orders provided.  She is awaiting behavioral health evaluation. ? ? ? ? ? ? ? ?Final Clinical Impression(s) / ED Diagnoses ?Final diagnoses:  ?Psychosis, unspecified psychosis type (HCC)  ? ? ?Rx / DC Orders ?ED Discharge Orders   ? ? None  ? ?  ? ? ?  ?Jeannie Fend, PA-C ?08/22/21 1754 ? ?  ?Linwood Dibbles, MD ?08/26/21 1510 ? ?

## 2021-08-22 NOTE — ED Notes (Signed)
3 white belonging bags with soiled clothes located under nurses desk 16-22 ?

## 2021-08-22 NOTE — ED Notes (Signed)
Patient now awake and agitated, urinating on the floor, and trying to leave room. ?

## 2021-08-22 NOTE — ED Notes (Signed)
Patient currently resting quietly with eyes closed ?

## 2021-08-22 NOTE — ED Notes (Signed)
Sitter at bedside.

## 2021-08-22 NOTE — ED Triage Notes (Signed)
Pt BIB police. Pt is a poor historian, says she wants to write down why she is here, will not give this RN any further information. With police, pt initially checked in for hallucinations and AMS.  ?

## 2021-08-22 NOTE — ED Notes (Addendum)
Spoke with patient's case worker, [name] [phone]), who reports patient has been acting bizarre and no longer has apartment. States at time of discharge, to call case worker and patient can be discharged to shelter. ?

## 2021-08-23 MED ORDER — HALOPERIDOL LACTATE 5 MG/ML IJ SOLN
5.0000 mg | Freq: Once | INTRAMUSCULAR | Status: AC
  Administered 2021-08-23: 5 mg via INTRAVENOUS
  Filled 2021-08-23: qty 1

## 2021-08-23 MED ORDER — HALOPERIDOL LACTATE 5 MG/ML IJ SOLN
5.0000 mg | Freq: Once | INTRAMUSCULAR | Status: AC
  Administered 2021-08-23: 5 mg via INTRAMUSCULAR
  Filled 2021-08-23: qty 1

## 2021-08-23 MED ORDER — HALOPERIDOL LACTATE 5 MG/ML IJ SOLN
5.0000 mg | Freq: Once | INTRAMUSCULAR | Status: AC | PRN
  Administered 2021-08-23: 5 mg via INTRAVENOUS
  Filled 2021-08-23: qty 1

## 2021-08-23 MED ORDER — HALOPERIDOL LACTATE 5 MG/ML IJ SOLN
5.0000 mg | INTRAMUSCULAR | Status: AC | PRN
  Administered 2021-08-23 (×3): 5 mg via INTRAVENOUS
  Filled 2021-08-23 (×3): qty 1

## 2021-08-23 NOTE — ED Notes (Signed)
Security called to beside to assist w/ placing pt in restraints.  Security and multiple ed staff members present in room to place pt in restraints and medicate pt per [facility]. ?

## 2021-08-23 NOTE — ED Notes (Signed)
ED tech and sitter attempted to put pt on bedpan d/t pt stating she had to "pee".  Pt refused bedpan and refused to be touched.  ED staff tried to offer bedpan multiple times but pt refused.   ?

## 2021-08-23 NOTE — ED Notes (Signed)
Pt up wandering in hallways twice this morning since 7am.  Pt redirected back into room.  Security present at bedside 2nd time.  Sitter present outside room.  MD Zackowski notified and informed.  ?

## 2021-08-23 NOTE — ED Provider Notes (Signed)
Who patient awaiting psychiatric evaluation they have been able to evaluate her because she has not been cooperative.  Appears she was combative yesterday looks like she was wandering the halls during the night.  We will place her in restraints and redosed with Haldol last had some Haldol at 2:30 in the morning.  Seem to help a little bit.  But nursing really feels restraints are required as well because she is not redirectable.  And is concerned that she will get violent again. ? ? ?  ?Vanetta Mulders, MD ?08/23/21 0732 ? ?

## 2021-08-23 NOTE — ED Provider Notes (Signed)
BP (!) 154/105   Pulse (!) 101   Temp 98.5 ?F (36.9 ?C) (Oral)   Resp 16   LMP  (LMP Unknown)   SpO2 100%  ?I was made aware of patient's behavior.  She came extremely combative, uncooperative trying to attack staff. ?She has been given medications for sedation. ?Patient is now resting comfortably. ?She is awaiting behavioral health assessment ?  ?Zadie Rhine, MD ?08/23/21 0147 ? ?

## 2021-08-23 NOTE — ED Provider Notes (Signed)
Patient still very combative with spurts of rage that his restraints are holding her.  We will go ahead and try some as needed Haldol 5 mg IV every 15x3 to see if we can get this under control. ?  ?Fredia Sorrow, MD ?08/23/21 0930 ? ?

## 2021-08-23 NOTE — ED Notes (Signed)
RN attempted to have a conversation with pt. RN asked pt questions and pt did not respond or acknowledge RN.  ?

## 2021-08-23 NOTE — ED Notes (Signed)
Patient awake.  Walking in and out of room.  Does not responded with sitter speaks to her or when she is directed to return to room.  After instructed by multiple staff members to return to room, patient did return to room.  Remains uncooperative.  ?

## 2021-08-23 NOTE — Consult Note (Signed)
Attempt to evaluate patient this morning failed as she remains agitated, disorganized and confused.  She is on 4 point restraint and not answering questions.  Patient is disheveled, unkempt.  We will continue to check in and once she is able to engage we will evaluate her.  ? ?

## 2021-08-23 NOTE — ED Notes (Signed)
Pt throwing herself back into the bed from sitting position to laying position repeatedly.  ED RN, sitter and ed tech have requested pt stop, instructed pt to stop multiple times.  Pt continues to throw herself back repeatedly regardless of instruction.  Pt yelling and screaming while moving threatening staff. ?

## 2021-08-23 NOTE — ED Provider Notes (Signed)
Patient now awake and alert wandering the halls.  Haldol has been ordered ?  ?Zadie Rhine, MD ?08/23/21 [phone] ? ?

## 2021-08-23 NOTE — ED Notes (Signed)
Restrains were removed by staff. Pt contracted for safety and verbalized understanding.  ?

## 2021-08-23 NOTE — ED Notes (Signed)
Patient awake and walking in hallway.  Will go from bathroom, to room, and down hall to other nursing station.  Patient hard to redirect ?

## 2021-08-23 NOTE — ED Notes (Signed)
Pt yelling and screaming at sitter.  Pt pulling and attempting to get out of restraints.  Pt continues to slam/throw herself back onto stretcher from sitting position. ?

## 2021-08-24 LAB — RAPID URINE DRUG SCREEN, HOSP PERFORMED
Amphetamines: NOT DETECTED
Barbiturates: NOT DETECTED
Benzodiazepines: NOT DETECTED
Cocaine: NOT DETECTED
Opiates: NOT DETECTED
Tetrahydrocannabinol: NOT DETECTED

## 2021-08-24 MED ORDER — CLONAZEPAM 1 MG PO TABS: 1.0000 mg | ORAL_TABLET | Freq: Two times a day (BID) | ORAL | Status: DC

## 2021-08-24 MED ORDER — NICOTINE POLACRILEX 2 MG MT GUM: 4.0000 mg | CHEWING_GUM | OROMUCOSAL | Status: DC | PRN

## 2021-08-24 MED ORDER — NICOTINE POLACRILEX 2 MG MT GUM
2.0000 mg | CHEWING_GUM | OROMUCOSAL | Status: DC | PRN
  Administered 2021-08-24 – 2021-08-27 (×5): 2 mg via ORAL
  Filled 2021-08-24 (×7): qty 1

## 2021-08-24 MED ORDER — HYDROXYZINE HCL 25 MG PO TABS
50.0000 mg | ORAL_TABLET | Freq: Three times a day (TID) | ORAL | Status: DC | PRN
  Administered 2021-08-25 – 2021-08-27 (×3): 50 mg via ORAL
  Filled 2021-08-24 (×3): qty 2

## 2021-08-24 MED ORDER — TRAZODONE HCL 50 MG PO TABS
50.0000 mg | ORAL_TABLET | Freq: Every day | ORAL | Status: DC
  Administered 2021-08-24 – 2021-08-27 (×4): 50 mg via ORAL
  Filled 2021-08-24 (×4): qty 1

## 2021-08-24 MED ORDER — PALIPERIDONE ER 6 MG PO TB24
6.0000 mg | ORAL_TABLET | Freq: Every day | ORAL | Status: DC
  Administered 2021-08-25 – 2021-08-28 (×4): 6 mg via ORAL
  Filled 2021-08-24 (×5): qty 1

## 2021-08-24 MED ORDER — CLONAZEPAM 0.5 MG PO TABS
0.5000 mg | ORAL_TABLET | Freq: Two times a day (BID) | ORAL | Status: DC
  Administered 2021-08-24 – 2021-08-25 (×2): 0.5 mg via ORAL
  Filled 2021-08-24 (×2): qty 1

## 2021-08-24 NOTE — Consult Note (Signed)
Attempt to evaluate patient this morning failed as she would not talk or engage in meaningful conversation.  Patient left provider in the room twice and walked out to the Nursing station.  She got her list of medications from a Nurse and came back to her room a second time.  She then started reading out loud the names and dosages of her medication to an unseen person.  When asked by this provider who she was communicating with she laughed and would not say a word.  She read the list out three times and stopped.  She then replied to a question where she is staying by stating she is homeless and lost her apartment one month ago.  She started smiling inappropriately again.  She laye down in bed and covered self from head to toe.  Patient remains Psychotic and meets criteria for inpatient Psychiatric hospitalization.   We will seek bed placement.  Patient does not know when she took medications last and who is her outpatient Psychiatrist. ?Plan:  Start Medications in the ER while seeking bed placement. ?Time spent 20 minutes. ? ?

## 2021-08-24 NOTE — ED Notes (Signed)
Pt transferred from room 19 to 30 on ED stretcher. In room 30, pt stood up, said she had to use the restroom, attempted to make it to the restroom but urinated on herself en route to restroom. Pt returned to the bed. Pt 4 belonging bags placed in TCU locker 30.  ?

## 2021-08-24 NOTE — ED Notes (Signed)
Pt woke up asking for food and drink, dinner tray warmed up and given to pt. ?

## 2021-08-24 NOTE — ED Notes (Signed)
Pt was asleep from 1:15 PM to the present.  ?

## 2021-08-24 NOTE — ED Provider Notes (Signed)
Emergency Medicine Observation Re-evaluation Note ? ?Molly [name] is a 36 y.o. female, seen on rounds today.  Pt initially presented to the ED for complaints of Psychiatric Evaluation ?Currently, the patient is resting at this time. ? ?Physical Exam  ?BP (!) 143/95 (BP Location: Right Arm)   Pulse 90   Temp 98.2 ?F (36.8 ?C) (Oral)   Resp 20   LMP  (LMP Unknown)   SpO2 100%  ?Physical Exam ?General: calm ?Cardiac: rrr ?Lungs: equal cr b/l ?Psych: calm ? ?ED Course / MDM  ?EKG:EKG Interpretation ? ?Date/Time:  Saturday August 22 2021 13:32:08 EDT ?Ventricular Rate:  104 ?PR Interval:  147 ?QRS Duration: 75 ?QT Interval:  395 ?QTC Calculation: 520 ?R Axis:   75 ?Text Interpretation: Sinus tachycardia Prolonged QT interval Confirmed by Zadie Rhine ([id number]) on 08/23/2021 12:19:59 AM ? ?I have reviewed the labs performed to date as well as medications administered while in observation.  Recent changes in the last 24 hours include patient required multiple doses of sedating medications, antipsychotic medications yesterday.  Combative behavior.  Aggressive behavior required restraints.  Refusing psychiatric assessment.  Difficult to redirect. ? ?Plan  ?Current plan is for placement. ?Molly [name] is under involuntary commitment. ?  ? ?  ?Sloan Leiter, DO ?08/24/21 [phone] ? ?

## 2021-08-24 NOTE — ED Notes (Signed)
Pt bed was soak of urine bed stripped and pt is currently in the shower. ?

## 2021-08-24 NOTE — ED Notes (Addendum)
Patient's home morning medications (Clozapine 100 mg x 1 tablet, Folic Acid 1 mg x 1 tablet, Lithium Carbonate ER 450 mg x 1 tablet, and Paliperidone ER 6 mg x 2 tablets) given by ACT team member without prior knowledge from pt's ED nurse or ED physician. ?

## 2021-08-24 NOTE — ED Notes (Signed)
Patient is a ward of [facility]. Called pt Case worker [name] cell [phone].  ? ?The pts ACT Team is [facility] ([facility]). Contact Lars Masson [phone] or Ruthann Cancer [phone]. ? ?Ms. Woolard communicated the following. She thinks the pts dx is schizoaffective disorder but didn't have pt chart with her. The past two years have been ?really rough? for the pt. Pt was in psychiatric hospitals 5-6 times over the past 2 years. On multiple occasions, pt left [facility] and they found her in hospitals in [place]. Ms. Carley Hammed does not think pt took her medications after discharge from hospitals. If pt did take medications, she did not take them as prescribed. Additionally, pt was evicted from her home and has been acting bizarre. ?She was in a hotel for 1 day, dressed in a trash bag, sitting with her legs open and intimidating staff. She was banned from the hotel. Pt is very sexual and gets attached to men really quickly. We try not to have her with female social workers.?  ?

## 2021-08-24 NOTE — ED Notes (Signed)
Pt is currently resting. Will collect vital signs at a later time. ?

## 2021-08-25 DIAGNOSIS — F29 Unspecified psychosis not due to a substance or known physiological condition: Secondary | ICD-10-CM

## 2021-08-25 MED ORDER — CLONAZEPAM 1 MG PO TABS
1.0000 mg | ORAL_TABLET | Freq: Two times a day (BID) | ORAL | Status: DC
  Administered 2021-08-25 – 2021-08-28 (×6): 1 mg via ORAL
  Filled 2021-08-25 (×6): qty 1

## 2021-08-25 MED ORDER — CLONAZEPAM 0.5 MG PO TABS
0.5000 mg | ORAL_TABLET | Freq: Once | ORAL | Status: AC
  Administered 2021-08-25: 0.5 mg via ORAL
  Filled 2021-08-25: qty 1

## 2021-08-25 MED ORDER — HALOPERIDOL 5 MG PO TABS
5.0000 mg | ORAL_TABLET | Freq: Once | ORAL | Status: DC
  Filled 2021-08-25: qty 1

## 2021-08-25 MED ORDER — DIPHENHYDRAMINE HCL 25 MG PO CAPS
50.0000 mg | ORAL_CAPSULE | Freq: Once | ORAL | Status: AC
  Administered 2021-08-25: 50 mg via ORAL
  Filled 2021-08-25: qty 2

## 2021-08-25 NOTE — Consult Note (Addendum)
[facility] Face-to-Face Psychiatry Consult  ? ?Reason for Consult:  Psychiatric evaluation ?Referring Physician:  ER Physician ?Patient Identification: Molly [name] ?MRN:  [id number] ?Principal Diagnosis: Schizoaffective disorder-chronic with exacerbation ([place]) ?Diagnosis:  Principal Problem: ?  Schizoaffective disorder-chronic with exacerbation ([place]) ? ? ?Total Time spent with patient: 30 minutes ? ?Subjective:   ?Molly [name] is a 36 y.o. female patient admitted with Schizoaffective disorder, Bipolar type, Borderline intelectual functioning and aggressive Behavior was brought to the ER two days ago under IVC  taken out on arrival to the ER for Bizarre behavior and Paranoia. ? ?HPI:  Patient was seen in her room this morning half naked.  She is pacing and walking in an out of her room.  She is much calmer today compared to two days ago when she was on restraint off and on and was receiving medications.  She remains confused, disorganized and unable to sit for meaningful conversation. She is able to name her ACT Team [facility] and her legal guardian.  She also named few of her medications but admitted she has not been taking them. ?Collateral information from [facility]SI staff yesterday is as follows- ?Patient is a ward of [facility]. Called pt Case worker [name] cell [phone].  ? ?The pts ACT Team is [facility] ([facility]). Contact Lars Masson [phone] or Ruthann Cancer [phone]. ? ?Ms. Woolard communicated the following. She thinks the pts dx is schizoaffective disorder but didn't have pt chart with her. The past two years have been ?really rough? for the pt. Pt was in psychiatric hospitals 5-6 times over the past 2 years. On multiple occasions, pt left [facility] and they found her in hospitals in [place]. Ms. Carley Hammed does not think pt took her medications after discharge from hospitals. If pt did take medications, she did not take them as prescribed. Additionally, pt was evicted from her  home and has been acting bizarre. ?She was in a hotel for 1 day, dressed in a trash bag, sitting with her legs open and intimidating staff. She was banned from the hotel. Pt is very sexual and gets attached to men really quickly. We try not to have her with female social workers.?   ?  ?  ?  ?Based on the fact that she is not compliant with medications and the protocol that is needed for Clozaril and Lithium and this patient not able to perform needed lab work for these Medications Clozaril and Lithium will not be resumed here in the ER.  We have started Invega , Klonopin and Trazodone at this time.  She meets criteria for Bed placement as well.  Patient meets criteria for inpatient hospitalization and we will be seeking bed placement. ? ? ? ?Addendum:  Provider called [name] at [facility] and Ms Carley Hammed  letting them know patient cannot have her medications daily from [facility] in the hospital.  Also provider informed them that patient is not a candidate for Lithium and Clozaril based on her hx of non compliance and constantly moving around.  She has not done lab work associated with Clozaril since her last Hospitalization and she moved to [place].  Until last week they did not know she moved to [place].  For Lithium she has not had Lithium level in a while.  Both [facility] staff informed Provider that they are not happy that somebody from [facility] started her on Clozaril and Lithium knowing her hx.  We all agreed on Invega orally for few days and then offer  injection.  They also stated that their doctor has been wanting to get her back on Injection Invega. ? ?Past Psychiatric History: Schizoaffective disorder, Schizophrenia, Bipolar disorder, Borderline intelectual functioning and aggressive  Behavior,  Multiple  inpatient Psych hospitalization.Multiple ER visits and she has [facility] Act team although she moves around and they cannot find her. ? ?Risk to Self:   ?Risk to Others:   ?Prior Inpatient Therapy:   ?Prior Outpatient  Therapy:   ? ?Past Medical History:  ?Past Medical History:  ?Diagnosis Date  ? Abnormal Pap smear   ? ASC-cannot exclude HGSIL on Pap 02/15/2012  ? ASC-US on 02/03/2012 pap (associated Trichomonas infection). No reflex HPV testing performed on specimen.  Patient informed that she will need repeat Pap in one year.      ? Asthma   ? ATTENTION DEFICIT, W/O HYPERACTIVITY, History of 06/30/2006  ? Qualifier: History of  By: McDiarmid MD, Sherren Mocha    ? CERVICITIS, GONOCOCCAL, History of 01/09/2007  ? Qualifier: History of  By: McDiarmid MD, Sherren Mocha    ? 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 Qualifier: History of  By: McDiarmid MD, Sherren Mocha    ? Depression   ? Diabetes mellitus   ? diet controlled  ? Eczema   ? Hypertension   ? Overactive bladder   ? Schizophrenia ([place])   ? SCHIZOPHRENIA, CATATONIC, HISTORY OF 12/13/2006  ? Annotation: Diagnoses by  Dr. Henrene Dodge (Psych) At [facility] in  [place], [place]. Qualifier: Hospitalized for  By: McDiarmid MD, Sherren Mocha    ? 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 Qualifier: Diagnosis of  By: McDiarmid MD, Sherren Mocha    ? TOBACCO USER 02/08/2009  ? Qualifier: Diagnosis of  By: Samara Snide    ?  ?Past Surgical History:  ?Procedure Laterality Date  ? INCISION AND DRAINAGE    ? pilanodal cyst  ? TOOTH EXTRACTION N/A 10/07/2017  ? Procedure: EXTRACTION TEETH NUMBERS ONE, SEVENTEEN, NINETEEN AND THIRTY TWO;  Surgeon: Diona Browner, DDS;  Location: [facility];  Service: Oral Surgery;  Laterality: N/A;  ? ?Family History:  ?Family History  ?Adopted: Yes  ?Problem Relation Age of Onset  ? Bipolar disorder Sister   ? Alcohol abuse Brother   ? Cancer Father   ? Diabetes Mother   ? ?Family Psychiatric  History: unknown ?Social History:  ?Social History  ? ?Substance and Sexual Activity  ?Alcohol Use No  ? Comment: occ  ?   ?Social History  ? ?Substance and Sexual Activity  ?Drug Use No  ?  ?Social History  ? ?Socioeconomic History  ? Marital status: Single  ?  Spouse name: Not on file  ? Number of  children: Not on file  ? Years of education: Not on file  ? Highest education level: Not on file  ?Occupational History  ? Not on file  ?Tobacco Use  ? Smoking status: Every Day  ?  Packs/day: 2.00  ?  Years: 10.00  ?  Pack years: 20.00  ?  Types: Cigarettes  ? Smokeless tobacco: Never  ?Vaping Use  ? Vaping Use: Never used  ?Substance and Sexual Activity  ? Alcohol use: No  ?  Comment: occ  ? Drug use: No  ? Sexual activity: Yes  ?  Birth control/protection: Injection  ?Other Topics Concern  ? Not on file  ?Social History Narrative  ? Adopted  ? Has Guardian  ? Living in [place] home with [name]  ? Transportation: Bus  ? ?  Social Determinants of Health  ? ?Financial Resource Strain: Not on file  ?Food Insecurity: Not on file  ?Transportation Needs: Not on file  ?Physical Activity: Not on file  ?Stress: Not on file  ?Social Connections: Not on file  ? ?Additional Social History: ?  ? ?Allergies:   ?Allergies  ?Allergen Reactions  ? Abilify [Aripiprazole] Other (See Comments)  ?  Thinks it's nasty- does not want it.  Injection is ok.    ? ? ?Labs:  ?Results for orders placed or performed during the hospital encounter of 08/22/21 (from the past 48 hour(s))  ?Urine rapid drug screen (hosp performed)     Status: None  ? Collection Time: 08/24/21  3:25 AM  ?Result Value Ref Range  ? Opiates NONE DETECTED NONE DETECTED  ? Cocaine NONE DETECTED NONE DETECTED  ? Benzodiazepines NONE DETECTED NONE DETECTED  ? Amphetamines NONE DETECTED NONE DETECTED  ? Tetrahydrocannabinol NONE DETECTED NONE DETECTED  ? Barbiturates NONE DETECTED NONE DETECTED  ?  Comment: (NOTE) ?DRUG SCREEN FOR MEDICAL PURPOSES ?ONLY.  IF CONFIRMATION IS NEEDED ?FOR ANY PURPOSE, NOTIFY LAB ?WITHIN 5 DAYS. ? ?LOWEST DETECTABLE LIMITS ?FOR URINE DRUG SCREEN ?Drug Class                     Cutoff (ng/mL) ?Amphetamine and metabolites    1000 ?Barbiturate and metabolites    200 ?Benzodiazepine                 200 ?Tricyclics and metabolites      300 ?Opiates and metabolites        300 ?Cocaine and metabolites        300 ?THC                            50 ?Performed at [facility] [place] [address] ?[place], [place] [address] ?  ? ? ?[place]

## 2021-08-25 NOTE — ED Notes (Signed)
Patient's [facility]SI doctor going to call patient tomorrow ?

## 2021-08-25 NOTE — ED Notes (Signed)
Patient uncooperative with care.   Redirectable at this time ? ?

## 2021-08-25 NOTE — ED Notes (Signed)
Patient agitated about medication. Patient wants the medication patient is on.  ? ?

## 2021-08-25 NOTE — ED Notes (Signed)
Patient was calm, cooperative, redirectable and slept the majority of the shift.  ?

## 2021-08-25 NOTE — ED Notes (Signed)
Patient has been alert and restless all shift.  Coming out of room.  Coloring all over the disposable dinner trays.  Threw garbage in toilet.  Needs redirection. Medication compliant, except Haldol, patient states allergic to Haldol   ?

## 2021-08-25 NOTE — ED Notes (Signed)
Pt given breakfast tray

## 2021-08-25 NOTE — Progress Notes (Signed)
Inpatient Behavioral Health Placement  ? ?Pt meets inpatient criteria per Cleatrice Burke, NP. There are no available beds at [facility] per [facility].   Referral was sent to the following facilities;  ? ?Destination ?Service Provider Address Phone Fax  ?[facility]  [address] [place] [address] [phone]  ?[facility]  [address], [place] [address] [phone]  ?[place]  [address], [place] [address] [id number]  ?[address] [place] [address] [phone]  ?[place] [address] [place] [address] [phone]  ?[facility]  [address], [place] [address] [phone]  ?[facility]  [address], [place] [address] [phone]  ?[facility]  [address] [place] [address] [phone]  ?[address] [place] [address] [phone]  ?[facility]  [address] [place] [address] [phone]  ?[facility]  [address], [place] [address] [phone]  ?[facility]  [address] [place] [address] [phone]  ?[place] [address], [place] [address] [phone]  ?[facility]  [address] [place] [address] [phone]  ?[place]  [place], [place] [address] [phone]  ?[facility] [address] [place] [address] [phone]  ?[facility]      ? ? ? ?Situation ongoing,  CSW will follow up. ? ? ?Benjaman Kindler, MSW, LCSWA ?08/25/2021  @ 11:51 PM ? ?

## 2021-08-25 NOTE — Progress Notes (Signed)
Patient has been denied by [facility] due to no appropriate beds available. Patient meets BH inpatient criteria per Dahlia Byes, NP. Patient has been faxed out to the following facilities:  ? ? ?[facility]  [address] [place] [address] [phone]  ?[facility]  [address] [place] [address] [phone]  ?[facility]  [address], [place] [address] [phone]  ?[facility]  [address] [place] [address] [phone]  ?[facility]  [address] [place] [address] [phone]  ?[facility]  [address] [place], [place] [address] [phone]  ?[facility]  [address] [place], [place] [address] [phone]  ?[facility]  [address] [place] [address] [phone]  ?[facility]  [address] [place] [address] [phone]  ?[facility]  [address] [place] [address] [phone]  ?[facility]  [address] [place] [address] [phone]  ?[facility]  [address] [place] [address] [phone]  ? ?Damita Dunnings, MSW, [name]-A  ?11:14 AM 08/25/2021   ?

## 2021-08-25 NOTE — ED Notes (Signed)
Patient not responding or answering questions ? ?

## 2021-08-25 NOTE — ED Notes (Signed)
Patient sleeping at this time. We will get VSs when she wakes up.  ?

## 2021-08-26 MED ORDER — LORAZEPAM 1 MG PO TABS
2.0000 mg | ORAL_TABLET | Freq: Once | ORAL | Status: AC
  Administered 2021-08-26: 2 mg via ORAL
  Filled 2021-08-26: qty 2

## 2021-08-26 NOTE — ED Notes (Signed)
Patient has not been violent. But patient has slammed doors, put trash in the toilet, tear up linen and make clothing out of it. Patient is on her period and refused a shower. She also refused to put bloody linen in the laundry. Pt. Stated she wants to "take them home". Patient danced around her room in her underwear. Pt. Now sleeping.  ?

## 2021-08-26 NOTE — ED Notes (Signed)
Patient has refused vital signs all night.  ?

## 2021-08-26 NOTE — Progress Notes (Signed)
Patient has been denied by [facility] due to no appropriate beds available. Patient meets [place] inpatient criteria per Charmaine Downs, NP. Patient has been faxed out to the following facilities:  ? ? ?[facility]  [address] [place] [address] [phone]  ?[place]  [address] [place] [address] [phone]  ?[address] [place] [address] [phone]  ?[place] [address], [place] [address] [phone]  ?[facility]  [place], [place] [address] [phone]  ?[facility]  [address], [place] [address] [phone]  ?[facility]  [address] [place] [address] [phone]  ?[address] [place] [address] [phone]  ?[facility]  [address] [place] [address] [phone]  ?[facility]  [address] [place] [address] [phone]  ?[facility]  [address] [place] [address] [phone]  ?[place] [address] [place] [address] [phone]  ?[facility]  [address] [place] [address] [phone]  ?[place]  [place], [place] [address] [phone]  ?[facility] [place] [address] [phone]  ?[facility]  [address] [place] [address] [phone]  ? ?Mariea Clonts, MSW, [name]-A  ?10:10 AM 08/26/2021   ?

## 2021-08-26 NOTE — Progress Notes (Signed)
CSW received consult in regard to pt's concerns with switching her Legal guardian to someone she knows. Pt will need to speak with her LG about concerns her. Pt is a ward of [facility] TOC sign off.  ? ?Valentina Shaggy.Miklos Bidinger, MSW, LCSWA ?[facility]  Transitions of Care ?Clinical Social Worker I ?Direct Dial: [phone]  Fax: [phone] ?[email or web address]  ?

## 2021-08-26 NOTE — ED Notes (Signed)
Patient very anxious, agitated, constantly coming out of room. Notified provider, [place]. New order for 2mg  PO ativan ordered and given. ?

## 2021-08-26 NOTE — ED Notes (Signed)
Pt refuse her vital signs  

## 2021-08-26 NOTE — ED Provider Notes (Signed)
Emergency Medicine Observation Re-evaluation Note ? ?Molly [name] is a 36 y.o. female, seen on rounds today.  Pt initially presented to the ED for complaints of Psychiatric Evaluation ?Currently, the patient is resting.  She is asking about potentially getting a new guardian. Will let the SW know. ? ?Physical Exam  ?BP (!) 136/99 (BP Location: Left Arm)   Pulse 98   Temp 98 ?F (36.7 ?C) (Oral)   Resp 20   LMP  (LMP Unknown)   SpO2 100%  ?Physical Exam ?General: No acute distress ?Lungs: Normal effort ?Psych: not agitated ? ?ED Course / MDM  ?EKG:EKG Interpretation ? ?Date/Time:  Saturday August 22 2021 13:32:08 EDT ?Ventricular Rate:  104 ?PR Interval:  147 ?QRS Duration: 75 ?QT Interval:  395 ?QTC Calculation: 520 ?R Axis:   75 ?Text Interpretation: Sinus tachycardia Prolonged QT interval Confirmed by Zadie Rhine ([id number]) on 08/23/2021 12:19:59 AM ? ?I have reviewed the labs performed to date as well as medications administered while in observation.  Recent changes in the last 24 hours include haldol, benadryl, klonopin. ? ?Plan  ?Current plan is for inpatient psychiatric treatment. ?ZADIE DEEMER is under involuntary commitment. ?  ? ?  ?Pricilla Loveless, MD ?08/26/21 1038 ? ?

## 2021-08-26 NOTE — ED Notes (Signed)
Staff was able to get patient to take a shower. Staff cleaned room while patient was in shower.  ?

## 2021-08-27 MED ORDER — ZIPRASIDONE MESYLATE 20 MG IM SOLR
10.0000 mg | Freq: Once | INTRAMUSCULAR | Status: DC
  Administered 2021-08-27: 10 mg via INTRAMUSCULAR
  Filled 2021-08-27: qty 20

## 2021-08-27 MED ORDER — STERILE WATER FOR INJECTION IJ SOLN
INTRAMUSCULAR | Status: AC
Start: 2021-08-27 — End: 2021-08-27
  Administered 2021-08-27: 1.2 mL
  Filled 2021-08-27: qty 10

## 2021-08-27 MED ORDER — LORAZEPAM 2 MG/ML IJ SOLN: 2.0000 mg | Freq: Once | INTRAMUSCULAR | Status: DC

## 2021-08-27 NOTE — Progress Notes (Signed)
[facility] [name] Progress Note ?  ?08/27/2021    2:11 PM ? ?Molly [name]  ? ?[id number]  ? ?Type of Contact and Topic:  Psychiatric Bed Placement  ? ?Pt accepted to [facility]    ? ?Patient meets inpatient criteria per Dahlia Byes, NP ? ?The attending provider will be Landry Mellow, MD ? ?Call report to [phone] or [phone] ? ?Vivi Ferns, RN @ [facility] ED notified.    ? ?Pt scheduled  to [facility] at [facility] 0800.  ? ? ?Damita Dunnings, MSW, [name]-A  ?2:14 PM 08/27/2021   ?  ? ?  ?  ? ? ? ? ?  ?

## 2021-08-27 NOTE — ED Notes (Signed)
Patient refused vital signs at this time 

## 2021-08-27 NOTE — ED Notes (Signed)
Pt continues to get up and wonder around despite multiple efforts to redirect. Pt sometimes blatantly ignores redirection. Pt occasionally has meltdowns coupled with manic behavior. ?

## 2021-08-27 NOTE — ED Notes (Addendum)
Patient got up and urinated in the floor and on herself. I insisted that she had to take off all her dirty garments and take a shower. At first she resisted and then I asked security to stand by and she complied with request. She showered with her black panties on. She did not completely rinse herself off in the shower. We tried to educate her that she will itch and her skin will break down if she does not completely rinse herself. She didn't rinse herself but proceeded to dry her soapy body instead. She left the wet panties on that she washed in the shower. She then put clean mesh panties over the black wet panties and over her breasts.  ? ?While she was in the shower, the TCU team cleaned her room along with EVS. She had papers and soiled maxi-pads in a bag in her room. There was also old food and food trays stored in her room. The team cleaned and threw away everything she had stored in the room.   ? ?After the shower, she went to her room and asked about what we had done with all her stuff. I stated that there was soiled pads with her papers and food in a bag. She said that every month when she bleeds that she has miscarried a baby so she keeps the pads. I tried to educate her that this was not true. She then stated that we are not supposed to bleed every month. I again tried to educate her that periods once a month are normal and they are not miscarriages.  ? ?Patient then went back to room.  ? ?We also had to take the paper towels out of her room d/t her stopping up the toilet by trying to flush them down the toilet.  ?

## 2021-08-27 NOTE — ED Provider Notes (Signed)
Emergency Medicine Observation Re-evaluation Note ? ?Molly [name] is a 36 y.o. female, seen on rounds today.  Pt initially presented to the ED for complaints of Psychiatric Evaluation ?Currently, the patient is in room, awake. ? ?Physical Exam  ?BP (!) 130/94 (BP Location: Right Arm)   Pulse 94   Temp 97.7 ?F (36.5 ?C) (Oral)   Resp 18   LMP  (LMP Unknown)   SpO2 100%  ?Physical Exam ?Skin: ?   General: Skin is warm.  ?Neurological:  ?   General: No focal deficit present.  ?   Mental Status: She is alert.  ? ? ? ?ED Course / MDM  ?EKG:EKG Interpretation ? ?Date/Time:  Saturday August 22 2021 13:32:08 EDT ?Ventricular Rate:  104 ?PR Interval:  147 ?QRS Duration: 75 ?QT Interval:  395 ?QTC Calculation: 520 ?R Axis:   75 ?Text Interpretation: Sinus tachycardia Prolonged QT interval Confirmed by Zadie Rhine ([id number]) on 08/23/2021 12:19:59 AM ? ?I have reviewed the labs performed to date as well as medications administered while in observation.  Recent changes in the last 24 hours include nothing. ? ?Plan  ?Current plan is for psych placemnt. ?Molly [name] is under involuntary commitment. ?  ? ?  ?Virgina Norfolk, DO ?08/27/21 7371 ? ?

## 2021-08-27 NOTE — ED Notes (Signed)
Patient went back to sleep after shower and slept for remainder of shift.  ?

## 2021-08-27 NOTE — ED Provider Notes (Signed)
Patient with some increased aggression.  Patient was given 2 of IM Ativan.  Per chart review her EKG shows slightly prolonged QTc.  After patient was given IM Ativan new EKG was obtained.  EKG shows sinus rhythm.  QTc is 480.  EKG did not crossover into the system. ?  Molly Norfolk, DO ?08/27/21 1352 ? ?

## 2021-08-27 NOTE — Progress Notes (Signed)
08/27/2021  1440  Called [facility] [phone] to arrange transportation for patient to go to [facility] for tomorrow 08/28/2021 after 3pm. Patient is on Sheriff's list. ?

## 2021-08-27 NOTE — Progress Notes (Signed)
Patient has been denied by [facility] due to no appropriate beds available. Patient meets [facility] inpatient criteria per Charmaine Downs, NP. Patient has been faxed out to the following facilities:  ? ?[place]  [address] [place] [address] [phone]  ?[facility]  [address], [place] [address] [phone]  ?[place]  [address] [place] [address] [id number]  ?[place] [place] [address] [phone]  ?[place] [address] [place] [address] [phone]  ?[facility]  [place], [place] [address] [phone]  ?[facility]  [place], [place] [address] [phone]  ?[facility]  [address] [place] [address] [phone]  ?[place] [address] [place] [address] [phone]  ?[facility]  [address], [place] [address] [phone]  ?[facility]  [address] [place] [address] [phone]  ?[facility]  [address] [place] [address] [phone]  ?[place] [address] [place] [address] [phone]  ?[facility]  [address] [place] [address] [phone]  ?[place]  , [place] [address] [phone]  ?[facility] [place] [address] [phone]  ?[facility]  [address] [place] [address] [phone]  ? ?Mariea Clonts, MSW, [name]-A  ?1:46 PM  08/27/2021   ?

## 2021-08-27 NOTE — Progress Notes (Signed)
08/27/2021  0930  Patient knocked medicine cup out of nurse hand and refused meds. ?

## 2021-08-27 NOTE — ED Notes (Signed)
Pt. Repeatedly keeps coming out her room slamming her door. Pt. Was asked to stop slamming her door. Pt then became verbally aggressive to the writer. Also will not stay in room since beginning up the shift. Has been out the room more than she has been in the room. Pt. Asked this Clinical research associate for water, was given two cups of water. Pt spilled both cups on the floor. I attempted to get the water up off the floor. Pt told me to get out her room. Towels was provided an Pt. Cleaned up the water. ?

## 2021-08-27 NOTE — ED Notes (Signed)
Patient refused VS at this time.

## 2021-08-27 NOTE — ED Notes (Signed)
Pt has continued to walk around the unit despite the constant redirection from sitter, Tree surgeon. Pt started walking towards the alcohol wipes, stating that she needed to wipe her floor down. Writer attempted to redirect pt to room, but pt began walking to another alcohol wipes. Another sitter told pt to go back to her room, pt became verbally aggressive stating "walk over there you b*tch", while raising her voice towards the sitter in a threatening matter. Pt is back in her room, but has been constantly walking out despite all the redirection.  ?

## 2021-08-28 NOTE — BH Assessment (Signed)
BHH Assessment Progress Note ?  ?At 13:57 this Clinical research associate called pt's [facility] DSS guardian, [name], notifying her that pt has been transferred to [facility]. ? ?Doylene Canning, MA ?Behavioral Health Coordinator ?[phone]  ?

## 2021-08-28 NOTE — ED Notes (Signed)
Pt asleep. Will check VS when she is awake ?

## 2021-08-28 NOTE — ED Notes (Signed)
Patient non compliant and restless. Keep asking for soda's and food. And every time the sitter and RN say no Pt getting verbally aggressive and slam the door. ?

## 2021-08-28 NOTE — ED Notes (Signed)
Patient DC off unit to facility per provider. Patient alert, cooperative, calm, no s/s of distress. DC information and belongings given to sheriff for transport. Patient ambulatory off unit, escorted and transported by sheriff.  ?

## 2021-08-28 NOTE — ED Provider Notes (Signed)
Emergency Medicine Observation Re-evaluation Note ? ?Molly [name] is a 36 y.o. female, seen on rounds today.  Pt initially presented to the ED for complaints of Psychiatric Evaluation ?Currently, the patient is upset about lack of clarity of test results. She wants to know if she has HIV or gonorrhea/chlamydia.  I discussed that these were not tested this admission and she became very upset. ? ?Physical Exam  ?BP (!) 142/93 (BP Location: Left Arm)   Pulse 94   Temp 97.8 ?F (36.6 ?C) (Axillary)   Resp 16   LMP  (LMP Unknown)   SpO2 100%  ?Physical Exam ?General: Agitated ?Cardiac: not assessed ?Lungs: normal work of breathing ?Psych: agitated ? ?ED Course / MDM  ?EKG:EKG Interpretation ? ?Date/Time:  Saturday August 22 2021 13:32:08 EDT ?Ventricular Rate:  104 ?PR Interval:  147 ?QRS Duration: 75 ?QT Interval:  395 ?QTC Calculation: 520 ?R Axis:   75 ?Text Interpretation: Sinus tachycardia Prolonged QT interval Confirmed by Ripley Fraise [phone]) on 08/23/2021 12:19:59 AM ? ?I have reviewed the labs performed to date as well as medications administered while in observation.  Recent changes in the last 24 hours include ativan being given yesterday. ? ?Plan  ?Current plan is for inpatient admission to [facility], due to go this morning.  I will add on HIV and GC/chlamydia testing per her request.  Also noted her potassium to be mildly low so we will order potassium level, especially in the setting of her getting antipsychotics.Marland Kitchen ?Molly [name] is under involuntary commitment. ?  ? ?  ?Sherwood Gambler, MD ?08/28/21 [phone] ? ?

## 2021-09-17 ENCOUNTER — Ambulatory Visit (HOSPITAL_COMMUNITY)
Admission: EM | Admit: 2021-09-17 | Discharge: 2021-09-17 | Disposition: A | Discharge status: Home / Self Care | Payer: 59 | Attending: Behavioral Health | Admitting: Behavioral Health

## 2021-09-17 DIAGNOSIS — F200 Paranoid schizophrenia: Secondary | ICD-10-CM | POA: Insufficient documentation

## 2021-09-17 DIAGNOSIS — Z91148 Patient's other noncompliance with medication regimen for other reason: Secondary | ICD-10-CM | POA: Insufficient documentation

## 2021-09-17 DIAGNOSIS — Z5900 Homelessness unspecified: Secondary | ICD-10-CM | POA: Diagnosis not present

## 2021-09-17 NOTE — ED Triage Notes (Signed)
Pt presents to [facility] voluntarily escorted by [employer]. Pt was picked up from a halfway house due to bizarre behavior. Per [employer] pt was walking around asking for help. Per [employer] the pt has a caseworker that was contacted, who states the pt has been off of her medication. Pt states that she is diagnosed with schizophrenia and has been off of her medication. Pt cannot recall the last time she took her medication. Pt is quiet, soft speech, and looking around in confusion during triage process. Pt denies SI/HI and AVH.

## 2021-09-17 NOTE — Discharge Instructions (Addendum)
Discharge recommendations:  Patient is to take medications as prescribed.  [facility] will follow up with you today at the [facility]   Please follow up with your primary care provider for all medical related needs.   Medications: The parent/guardian is to contact a medical professional and/or outpatient provider to address any new side effects that develop. Parent/guardian should update outpatient providers of any new medications and/or medication changes.   Atypical antipsychotics: If you are prescribed an atypical antipsychotic, it is recommended that your height, weight, BMI, blood pressure, fasting lipid panel, and fasting blood sugar be monitored by your outpatient providers.  Safety:  The patient should abstain from use of illicit substances/drugs and abuse of any medications. If symptoms worsen or do not continue to improve or if the patient becomes actively suicidal or homicidal then it is recommended that the patient return to the closest hospital emergency department, the [facility], or call 911 for further evaluation and treatment. National Suicide Prevention Lifeline 1-800-SUICIDE or (347)242-9005.  About 988 988 offers 24/7 access to trained crisis counselors who can help people experiencing mental health-related distress. People can call or text 988 or chat 988lifeline.org for themselves or if they are worried about a loved one who may need crisis support.

## 2021-09-17 NOTE — Progress Notes (Signed)
Molly [name] received her AVS, questions answered and she retrieved her personal belongings. She was given a bus pass per legal guardian. She was discharged without incident.

## 2021-09-17 NOTE — ED Provider Notes (Signed)
Behavioral Health Urgent Care Medical Screening Exam  Patient Name: Molly [name] MRN: [id number] Date of Evaluation: 09/17/21 Diagnosis:  Final diagnoses:  Noncompliance with medication regimen  Paranoid schizophrenia (HCC)    History of Present illness: DREAM HARMAN is a 36 y.o. female patient who presents to the [facility] voluntarily accompanied by law enforcement with a chief complaint of patient was picked up from a halfway house due to bizarre behaviors. Patient was walking around asking for help. Patient's caseworker advised that the patient has been off her medications and has a diagnosis of schizophrenia.  Patient seen and evaluated face-to-face by his provider, chart reviewed and case discussed with Dr. Bronwen Betters. On evaluation, patient is alert and oriented to person place and time. Her thought process is circumstantial and relevant. Her speech is clear and coherent at a decreased time. She has minimum eye contact. She appears malodorous and disheveled. There is no objective evidence that the patient is currently responding to internal or external stimuli or experiencing delusional or paranoid thought content. She is calm and cooperative throughout the assessment.  Patient states that she was brought in by police because she is hungry. She states that the police brought her in for evaluation for medications. She states that she does not need anything done. She states that she is currently homeless.   Patient states that she has been off her medications for the past 3 days. She is unable to recall the name of the medications that she is currently prescribed. Per chart review, the patient follows up with psychotherapeutic ACT team for medication management. Per chart review, patient is prescribed Clozapine, lithium, and Invega.   Patient denies suicidal ideations. She denies homicidal ideations. She denies auditory or visual hallucinations. She denies paranoia thought content. She reports  sleeping 3 to 4 hours per night.  She denies drinking alcohol or using illicit drugs. She states that she is uncertain as to when she last followed up with the [facility] ACT team.  I spoke to the receptionist at the ACT team who states that the patient is scheduled for a follow-up visit today. She states that Dr. Cherylann Ratel and Alease Medina with visit the patient today.   I spoke to the patient's legal guardian Ms. Woolard with DSS to inform her that the patient was brought in by law enforcement for an evaluation. She states that she was notified that the patient was upset and acting bizarre. She states that the patient has been noncompliant with her medications and has refused placement. She states that the patient is currently homeless because she lost her place. She states that she will follow up with the ACT team to see if they will visit the patient at the [facility] today. She states that the patient can be given a bus pass to go to the [facility] as she is unable to pick the patient up from the facility today.  Psychiatric Specialty Exam  Presentation  General Appearance:Disheveled  Eye Contact:Minimal  Speech:Clear and Coherent  Speech Volume:Decreased  Handedness:Right   Mood and Affect  Mood:Euthymic  Affect:Congruent   Thought Process  Thought Processes:Coherent; Linear  Descriptions of Associations:Circumstantial  Orientation:Full (Time, Place and Person)  Thought Content:Logical  Diagnosis of Schizophrenia or Schizoaffective disorder in past: Yes  Duration of Psychotic Symptoms: Greater than six months  Hallucinations:None  Ideas of Reference:None  Suicidal Thoughts:No  Homicidal Thoughts:No   Sensorium  Memory:Immediate Fair  Judgment:Intact  Insight:Present   Executive Functions  Concentration:Fair  Attention Span:Fair  Recall:Fair  Fund of Knowledge:Fair  Language:Fair   Psychomotor Activity  Psychomotor Activity:Normal   Assets  Assets:Communication Skills;  Social Support; Leisure Time; Physical Health   Sleep  Sleep:Fair  Number of hours: 4 (homeless)   No data recorded  Physical Exam: Physical Exam HENT:     Head: Normocephalic.     Nose: Nose normal.  Eyes:     Conjunctiva/sclera: Conjunctivae normal.  Cardiovascular:     Rate and Rhythm: Normal rate.  Pulmonary:     Effort: Pulmonary effort is normal.  Musculoskeletal:        General: Normal range of motion.  Neurological:     Mental Status: She is alert and oriented to person, place, and time.   Review of Systems  Constitutional: Negative.   HENT: Negative.    Eyes: Negative.   Respiratory: Negative.    Cardiovascular: Negative.   Gastrointestinal: Negative.   Genitourinary: Negative.   Skin: Negative.   Neurological: Negative.   Endo/Heme/Allergies: Negative.   Blood pressure (!) 121/91, pulse 98, temperature 97.9 F (36.6 C), temperature source Oral, resp. rate 18, SpO2 100 %, unknown if currently breastfeeding. There is no height or weight on file to calculate BMI.  Musculoskeletal: Strength & Muscle Tone: within normal limits Gait & Station: normal Patient leans: N/A   [facility] MSE Discharge Disposition for Follow up and Recommendations: Based on my evaluation the patient does not appear to have an emergency medical condition and can be discharged with resources and follow up care in outpatient services for Medication Management  Discharge recommendations:  Patient is to take medications as prescribed.  [facility]; will follow up with you today at the [facility].   Please follow up with your primary care provider for all medical related needs.   Medications: The parent/guardian is to contact a medical professional and/or outpatient provider to address any new side effects that develop. Parent/guardian should update outpatient providers of any new medications and/or medication changes.   Atypical antipsychotics: If you are prescribed an  atypical antipsychotic, it is recommended that your height, weight, BMI, blood pressure, fasting lipid panel, and fasting blood sugar be monitored by your outpatient providers.  Safety:  The patient should abstain from use of illicit substances/drugs and abuse of any medications. If symptoms worsen or do not continue to improve or if the patient becomes actively suicidal or homicidal then it is recommended that the patient return to the closest hospital emergency department, the [facility], or call 911 for further evaluation and treatment. National Suicide Prevention Lifeline 1-800-SUICIDE or (364)663-5964.  About 988 988 offers 24/7 access to trained crisis counselors who can help people experiencing mental health-related distress. People can call or text 988 or chat 988lifeline.org for themselves or if they are worried about a loved one who may need crisis support.    Evva Din L, NP 09/17/2021, 10:50 AM

## 2021-09-21 ENCOUNTER — Emergency Department (HOSPITAL_COMMUNITY)
Admission: EM | Admit: 2021-09-21 | Discharge: 2021-09-29 | Disposition: A | Discharge status: Other Acute Inpatient Hospital | Payer: 59 | Attending: Student | Admitting: Student

## 2021-09-21 ENCOUNTER — Encounter (HOSPITAL_COMMUNITY): Payer: Self-pay | Admitting: Emergency Medicine

## 2021-09-21 DIAGNOSIS — Z79899 Other long term (current) drug therapy: Secondary | ICD-10-CM | POA: Insufficient documentation

## 2021-09-21 DIAGNOSIS — M546 Pain in thoracic spine: Secondary | ICD-10-CM | POA: Diagnosis not present

## 2021-09-21 DIAGNOSIS — F259 Schizoaffective disorder, unspecified: Secondary | ICD-10-CM | POA: Diagnosis not present

## 2021-09-21 DIAGNOSIS — I10 Essential (primary) hypertension: Secondary | ICD-10-CM | POA: Insufficient documentation

## 2021-09-21 DIAGNOSIS — F17210 Nicotine dependence, cigarettes, uncomplicated: Secondary | ICD-10-CM | POA: Diagnosis not present

## 2021-09-21 DIAGNOSIS — D72829 Elevated white blood cell count, unspecified: Secondary | ICD-10-CM | POA: Diagnosis not present

## 2021-09-21 DIAGNOSIS — F209 Schizophrenia, unspecified: Secondary | ICD-10-CM

## 2021-09-21 DIAGNOSIS — E119 Type 2 diabetes mellitus without complications: Secondary | ICD-10-CM | POA: Diagnosis not present

## 2021-09-21 LAB — CBC WITH DIFFERENTIAL/PLATELET
Abs Immature Granulocytes: 0.05 10*3/uL (ref 0.00–0.07)
Basophils Absolute: 0.1 10*3/uL (ref 0.0–0.1)
Basophils Relative: 1 %
Eosinophils Absolute: 0.3 10*3/uL (ref 0.0–0.5)
Eosinophils Relative: 3 %
HCT: 35 % — ABNORMAL LOW (ref 36.0–46.0)
Hemoglobin: 11.4 g/dL — ABNORMAL LOW (ref 12.0–15.0)
Immature Granulocytes: 0 %
Lymphocytes Relative: 32 %
Lymphs Abs: 4 10*3/uL (ref 0.7–4.0)
MCH: 28.8 pg (ref 26.0–34.0)
MCHC: 32.6 g/dL (ref 30.0–36.0)
MCV: 88.4 fL (ref 80.0–100.0)
Monocytes Absolute: 1.1 10*3/uL — ABNORMAL HIGH (ref 0.1–1.0)
Monocytes Relative: 9 %
Neutro Abs: 6.9 10*3/uL (ref 1.7–7.7)
Neutrophils Relative %: 55 %
Platelets: 516 10*3/uL — ABNORMAL HIGH (ref 150–400)
RBC: 3.96 MIL/uL (ref 3.87–5.11)
RDW: 15.5 % (ref 11.5–15.5)
WBC: 12.5 10*3/uL — ABNORMAL HIGH (ref 4.0–10.5)
nRBC: 0 % (ref 0.0–0.2)

## 2021-09-21 LAB — COMPREHENSIVE METABOLIC PANEL
ALT: 13 U/L (ref 0–44)
AST: 20 U/L (ref 15–41)
Albumin: 3.5 g/dL (ref 3.5–5.0)
Alkaline Phosphatase: 98 U/L (ref 38–126)
Anion gap: 7 (ref 5–15)
BUN: 7 mg/dL (ref 6–20)
CO2: 24 mmol/L (ref 22–32)
Calcium: 8.9 mg/dL (ref 8.9–10.3)
Chloride: 102 mmol/L (ref 98–111)
Creatinine, Ser: 0.69 mg/dL (ref 0.44–1.00)
GFR, Estimated: >60 mL/min (ref >60)
Glucose, Bld: 105 mg/dL — ABNORMAL HIGH (ref 70–99)
Potassium: 3.6 mmol/L (ref 3.5–5.1)
Sodium: 133 mmol/L — ABNORMAL LOW (ref 135–145)
Total Bilirubin: 0.2 mg/dL — ABNORMAL LOW (ref 0.3–1.2)
Total Protein: 6.9 g/dL (ref 6.5–8.1)

## 2021-09-21 LAB — I-STAT BETA HCG BLOOD, ED (MC, WL, AP ONLY): I-stat hCG, quantitative: <5 m[IU]/mL (ref <5)

## 2021-09-21 LAB — ETHANOL: Alcohol, Ethyl (B): <10 mg/dL (ref <10)

## 2021-09-21 LAB — SALICYLATE LEVEL: Salicylate Lvl: <7 mg/dL — ABNORMAL LOW (ref 7.0–30.0)

## 2021-09-21 LAB — ACETAMINOPHEN LEVEL: Acetaminophen (Tylenol), Serum: <10 ug/mL — ABNORMAL LOW (ref 10–30)

## 2021-09-21 LAB — LITHIUM LEVEL: Lithium Lvl: <0.06 mmol/L — ABNORMAL LOW (ref 0.60–1.20)

## 2021-09-21 MED ORDER — ACETAMINOPHEN 500 MG PO TABS
1000.0000 mg | ORAL_TABLET | Freq: Once | ORAL | Status: AC
  Administered 2021-09-21: 1000 mg via ORAL
  Filled 2021-09-21: qty 2

## 2021-09-21 MED ORDER — LITHIUM CARBONATE ER 450 MG PO TBCR
450.0000 mg | EXTENDED_RELEASE_TABLET | Freq: Every morning | ORAL | Status: DC
  Administered 2021-09-27 – 2021-09-28 (×2): 450 mg via ORAL
  Filled 2021-09-21 (×4): qty 1

## 2021-09-21 MED ORDER — PALIPERIDONE ER 6 MG PO TB24
12.0000 mg | ORAL_TABLET | Freq: Every morning | ORAL | Status: DC
Start: 2021-09-22 — End: 2021-09-29
  Administered 2021-09-27 – 2021-09-28 (×2): 12 mg via ORAL
  Filled 2021-09-21 (×4): qty 2

## 2021-09-21 MED ORDER — LITHIUM CARBONATE ER 300 MG PO TBCR
600.0000 mg | EXTENDED_RELEASE_TABLET | Freq: Every day | ORAL | Status: DC
Start: 2021-09-21 — End: 2021-09-29
  Administered 2021-09-22 – 2021-09-28 (×6): 600 mg via ORAL
  Filled 2021-09-21 (×9): qty 2

## 2021-09-21 MED ORDER — LORAZEPAM 1 MG PO TABS
1.0000 mg | ORAL_TABLET | Freq: Once | ORAL | Status: AC
  Administered 2021-09-21: 1 mg via ORAL
  Filled 2021-09-21: qty 1

## 2021-09-21 NOTE — ED Triage Notes (Addendum)
Pt transported to ED by [facility], pt ask individual to call 911 for back pain, EMS reports pt nonverbal during transport, someone on scene knew pts caseworker and was able to contact her and let them know she was coming to the ED for evaluation.  During triage pt is nonverbal, disheveled, strong body odor. Pt noted to be wearing multiple undergarments and a formal gown.

## 2021-09-21 NOTE — ED Provider Triage Note (Signed)
Emergency Medicine Provider Triage Evaluation Note  Molly [name] , a 36 y.o. female  was evaluated in triage.  Pt complained to EMS of back pain.  Patient states that I need to "calm down." Does not tell me any more information.  Review of Systems  Positive: Back pain? Negative:   Physical Exam  BP 120/83   Pulse (!) 104   Temp 98.7 F (37.1 C) (Oral)   Resp 16   LMP  (LMP Unknown)   SpO2 98%  Gen:   Awake, no distress   Resp:  Normal effort  MSK:   Moves extremities without difficulty  Other:  Patient staring at me, does not give more information. Does not answer if she is suicidal  Medical Decision Making  Medically screening exam initiated at 6:17 PM.  Appropriate orders placed.  Molly [name] was informed that the remainder of the evaluation will be completed by another provider, this initial triage assessment does not replace that evaluation, and the importance of remaining in the ED until their evaluation is complete.  Will order medical clearance labs   Dietrich Pates, PA-C 09/21/21 1818

## 2021-09-21 NOTE — ED Provider Notes (Signed)
[facility] EMERGENCY DEPARTMENT Provider Note  CSN: [id number] Arrival date & time: 09/21/21 1759  Chief Complaint(s) Mental Health Problem  HPI Molly [name] is a 36 y.o. female patient seen in the emergency department for evaluation of back pain and being off of her psychiatric medications.  Patient endorsing midthoracic back pain but will not provide any additional history to me.  She does deny trauma, dysuria or any vaginal bleeding or discharge.  She states that she does not have any of her medication.  Additional history unable to be obtained due to suspected underlying psychiatric disorder.   Past Medical History Past Medical History:  Diagnosis Date   Abnormal Pap smear    ASC-cannot exclude HGSIL on Pap 02/15/2012   ASC-US on 02/03/2012 pap (associated Trichomonas infection). No reflex HPV testing performed on specimen.  Patient informed that she will need repeat Pap in one year.       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 Dennie Bible (Psych) At [facility] in  [place], [place]. 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   Qualifier: Diagnosis of  By: McDiarmid MD, Benjaman Pott USER 02/08/2009   Qualifier: Diagnosis of  By: Knox Royalty     Patient Active Problem List   Diagnosis Date Noted   Involuntary commitment 08/19/2021   Hypersomnolence 10/30/2019   Schizophrenia (HCC) 10/17/2018   Post-operative state  10/07/2017   Disorganized schizophrenia (HCC)    Psychoses (HCC)    Borderline intellectual functioning    Elevated WBC count 04/07/2014   Noncompliance with medication regimen 04/07/2014   Schizoaffective disorder-chronic with exacerbation (HCC) 02/06/2014   Aggressive behavior 08/16/2013   Papular rash, generalized 09/01/2012   Amenorrhea 04/25/2012   ASC-cannot exclude HGSIL on Pap 02/15/2012   Screening for malignant neoplasm of the cervix 02/01/2012   CONDYLOMA ACUMINATA, 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 Authorizing Provider  cloZAPine (CLOZARIL) 100 MG tablet Take 3 tablets (300 mg total) by mouth 2 (two) times daily. Patient taking differently: Take 100 mg by mouth every morning. 05/29/16   Charm Rings, NP  clozapine (CLOZARIL) 200 MG tablet Take 200 mg by mouth at bedtime. 04/29/21   [provider]  folic acid (FOLVITE) 1 MG tablet Take 1 mg by mouth every morning. 04/29/21   [provider]  lithium carbonate (ESKALITH) 450 MG CR tablet Take 450 mg by mouth in the morning. 04/29/21   [provider]  lithium carbonate (LITHOBID) 300 MG CR tablet Take 600 mg by mouth at bedtime. 04/29/21   [provider]  paliperidone (INVEGA) 6 MG 24 hr tablet Take 12 mg by mouth in the morning. 04/29/21   [provider]                                                                                                                                    Past Surgical History Past Surgical History:  Procedure Laterality Date   INCISION AND DRAINAGE     pilanodal cyst   TOOTH EXTRACTION N/A 10/07/2017   Procedure: EXTRACTION TEETH NUMBERS ONE,  SEVENTEEN, NINETEEN AND THIRTY TWO;  Surgeon: Ocie DoyneJensen, Scott, DDS;  Location: [facility] OR;  Service: Oral Surgery;  Laterality: N/A;   Family History Family History  Adopted: Yes  Problem Relation Age of Onset   Bipolar disorder Sister    Alcohol abuse Brother    Cancer Father    Diabetes Mother     Social History Social History   Tobacco Use   Smoking status: Every Day    Packs/day: 2.00    Years: 10.00    Pack years: 20.00    Types: Cigarettes   Smokeless tobacco: Never  Vaping Use   Vaping Use: Never used  Substance Use Topics   Alcohol use: No    Comment: occ   Drug use: No   Allergies Abilify [aripiprazole]  Review of Systems Review of Systems  Unable to perform ROS: Psychiatric disorder   Physical Exam Vital Signs  I have reviewed the triage vital signs BP 120/83   Pulse (!) 104   Temp 98.7 F (37.1 C) (Oral)   Resp 16   LMP  (LMP Unknown)   SpO2 98%   Physical Exam Vitals and nursing note reviewed.  Constitutional:      General: She is not in acute distress.    Appearance: She is well-developed.  HENT:     Head: Normocephalic and atraumatic.  Eyes:     Conjunctiva/sclera: Conjunctivae normal.  Cardiovascular:     Rate and Rhythm: Normal rate and regular rhythm.     Heart sounds: No murmur heard. Pulmonary:     Effort: Pulmonary effort is normal. No respiratory distress.     Breath sounds: Normal breath sounds.  Abdominal:     Palpations: Abdomen is soft.     Tenderness: There is no abdominal tenderness.  Musculoskeletal:        General: No swelling.     Cervical back: Neck supple.  Skin:    General: Skin is warm and dry.     Capillary Refill: Capillary refill takes less than 2 seconds.  Neurological:     Mental Status: She is alert.  Psychiatric:        Mood and Affect: Mood normal.    ED Results and Treatments Labs (all labs ordered are listed, but only abnormal results are displayed) Labs Reviewed  COMPREHENSIVE METABOLIC PANEL -  Abnormal; Notable for the following components:  Result Value   Sodium 133 (*)    Glucose, Bld 105 (*)    Total Bilirubin 0.2 (*)    All other components within normal limits  CBC WITH DIFFERENTIAL/PLATELET - Abnormal; Notable for the following components:   WBC 12.5 (*)    Hemoglobin 11.4 (*)    HCT 35.0 (*)    Platelets 516 (*)    Monocytes Absolute 1.1 (*)    All other components within normal limits  ACETAMINOPHEN LEVEL - Abnormal; Notable for the following components:   Acetaminophen (Tylenol), Serum <10 (*)    All other components within normal limits  SALICYLATE LEVEL - Abnormal; Notable for the following components:   Salicylate Lvl <7.0 (*)    All other components within normal limits  LITHIUM LEVEL - Abnormal; Notable for the following components:   Lithium Lvl <0.06 (*)    All other components within normal limits  ETHANOL  RAPID URINE DRUG SCREEN, HOSP PERFORMED  I-STAT BETA HCG BLOOD, ED ([facility], [facility], [facility] ONLY)                                                                                                                          Radiology No results found.  Pertinent labs & imaging results that were available during my care of the patient were reviewed by me and considered in my medical decision making (see MDM for details).  Medications Ordered in ED Medications - No data to display                                                                                                                                   Procedures Procedures  (including critical care time)  Medical Decision Making / ED Course   This patient presents to the ED for concern of back pain, medication noncompliance, this involves an extensive number of treatment options, and is a complaint that carries with it a high risk of complications and morbidity.  The differential diagnosis includes medication noncompliance, psychosis, lack of outpatient resources, musculoskeletal back pain,  trauma  MDM: Patient seen emergency room for evaluation of multiple complaints as described above.  Laboratory evaluation with a mild leukocytosis to 12.5 which is nonspecific but is otherwise unremarkable.  Lithium level is undetectable consistent with patient not taking her medications.  Patient is overall medically cleared and safe for psychiatric evaluation.  Disposition pending TTS.   Additional  history obtained:  -External records from outside source obtained and reviewed including: Chart review including previous notes, labs, imaging, consultation notes   Lab Tests: -I ordered, reviewed, and interpreted labs.   The pertinent results include:   Labs Reviewed  COMPREHENSIVE METABOLIC PANEL - Abnormal; Notable for the following components:      Result Value   Sodium 133 (*)    Glucose, Bld 105 (*)    Total Bilirubin 0.2 (*)    All other components within normal limits  CBC WITH DIFFERENTIAL/PLATELET - Abnormal; Notable for the following components:   WBC 12.5 (*)    Hemoglobin 11.4 (*)    HCT 35.0 (*)    Platelets 516 (*)    Monocytes Absolute 1.1 (*)    All other components within normal limits  ACETAMINOPHEN LEVEL - Abnormal; Notable for the following components:   Acetaminophen (Tylenol), Serum <10 (*)    All other components within normal limits  SALICYLATE LEVEL - Abnormal; Notable for the following components:   Salicylate Lvl <7.0 (*)    All other components within normal limits  LITHIUM LEVEL - Abnormal; Notable for the following components:   Lithium Lvl <0.06 (*)    All other components within normal limits  ETHANOL  RAPID URINE DRUG SCREEN, HOSP PERFORMED  I-STAT BETA HCG BLOOD, ED ([facility], [facility], [facility] ONLY)     Medicines ordered and prescription drug management: No orders of the defined types were placed in this encounter.   -I have reviewed the patients home medicines and have made adjustments as needed  Critical interventions none  Consultations Obtained: I  requested consultation with the TTS,  and discussed lab and imaging findings as well as pertinent plan - they recommend: recs pending    Cardiac Monitoring: The patient was maintained on a cardiac monitor.  I personally viewed and interpreted the cardiac monitored which showed an underlying rhythm of: NSR  Social Determinants of Health:  Factors impacting patients care include: Medication noncompliance   Reevaluation: After the interventions noted above, I reevaluated the patient and found that they have :stayed the same  Co morbidities that complicate the patient evaluation  Past Medical History:  Diagnosis Date   Abnormal Pap smear    ASC-cannot exclude HGSIL on Pap 02/15/2012   ASC-US on 02/03/2012 pap (associated Trichomonas infection). No reflex HPV testing performed on specimen.  Patient informed that she will need repeat Pap in one year.       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 Dennie Bible (Psych) At [facility] in  [place], [place]. 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

## 2021-09-22 LAB — RAPID URINE DRUG SCREEN, HOSP PERFORMED
Amphetamines: NOT DETECTED
Barbiturates: NOT DETECTED
Benzodiazepines: NOT DETECTED
Cocaine: NOT DETECTED
Opiates: NOT DETECTED
Tetrahydrocannabinol: NOT DETECTED

## 2021-09-22 MED ORDER — STERILE WATER FOR INJECTION IJ SOLN
INTRAMUSCULAR | Status: AC
  Administered 2021-09-22: 1 mL
  Filled 2021-09-22: qty 10

## 2021-09-22 MED ORDER — ZIPRASIDONE MESYLATE 20 MG IM SOLR
20.0000 mg | Freq: Once | INTRAMUSCULAR | Status: AC
  Administered 2021-09-22: 20 mg via INTRAMUSCULAR
  Filled 2021-09-22: qty 20

## 2021-09-22 MED ORDER — ZIPRASIDONE MESYLATE 20 MG IM SOLR: 10.0000 mg | Freq: Once | INTRAMUSCULAR | Status: DC

## 2021-09-22 NOTE — ED Notes (Signed)
IVC paperwork done and handed to nurse 

## 2021-09-22 NOTE — Progress Notes (Signed)
Patient has been denied by [facility] due to no appropriate beds available. Patient meets [place] inpatient criteria per Darrol Angel, NP. Patient has been faxed out to the following facilities:   [facility]  [address] [place] [address] [phone] [place]  [place], [place] [address] [phone] [place]  [address], [place] [address] [phone]  [place]  [address], [place] [address] [phone] [place]  [address] [place] [address] [phone]  [facility]  [address], [place] [address] [phone] [facility]  [place], [place] [address] [phone]  [facility]  [address] [place], [place] [address] [phone]  [facility]  [phone] [place] [address] [phone] [facility]  [address] [place] [address] [phone] [facility]  [address], [place] [address] [phone]  [facility]  [address] [place] [address] [place]  [facility]  [address] [place] [address] [phone] [place], MSW, [name]-A  12:10 PM 09/22/2021

## 2021-09-22 NOTE — ED Provider Notes (Signed)
Nursing staff requested that I come evaluate the patient and ordered medication because she was screaming, having conversations with herself and slamming the door and purple.  When I arrived patient was screaming at the top of her lung and repeatedly slamming the door.  Geodon ordered.   Darliss Ridgel 09/22/21 1419    Carmin Muskrat, MD 09/22/21 [phone]

## 2021-09-22 NOTE — ED Notes (Signed)
TTS at bedside. 

## 2021-09-22 NOTE — ED Notes (Signed)
Patient is resting comfortably. 

## 2021-09-22 NOTE — ED Notes (Signed)
Pt. Is current taken a shower and was given a snack and drink

## 2021-09-22 NOTE — ED Notes (Addendum)
Patient observed wiping the room down with wipes and pulling supplies out of the cabinets. Patient observed pushing buttons on the posey alarm system. Patient provided burgundy scrubs and dressed in them.

## 2021-09-22 NOTE — Consult Note (Signed)
Telepsych Consultation   Reason for Consult:  psych consult  Referring Physician:  Dietrich Pates, PA-C Location of Patient: [facility] Location of Provider: [facility]  Patient Identification: Molly [name] MRN:  [id number] Principal Diagnosis: Schizoaffective disorder [facility]) Diagnosis:  Principal Problem:   Schizoaffective disorder (HCC)   Total Time spent with patient: 20 minutes  Subjective: Molly [name] is a 36 y.o. female patient admitted to [facility] via [facility], pt ask individual to call 911 for back pain, EMS reports pt nonverbal during transport, someone on scene knew pts caseworker and was able to contact her and let them know she was coming to the ED for evaluation. During triage pt is nonverbal, disheveled, strong body odor. Pt noted to be wearing multiple undergarments and a formal gown."    HPI:  Patient seen via tele health by this provider; chart reviewed and consulted with Dr. Lucianne Muss on 09/22/21. On evaluation Molly [name] is lying down in bed facing the camera with minimal eye contact. She is alert and oriented x3. Patient was able to answer orientation questions but immediately stopped responding to questions. Patient appears to have thought blocking AEB delayed and slow responses to orientation questions and staring. Patient noted to have a blank stare and was observed to be staring at the wall during the assessment. This provider attempted to engage the patient throughout the assessment however, patient would not respond.  I spoke to the patient's legal guardian Ms. Woodlard [phone] who states that the patient was seen at a DSS colleagues housing development last night. She states that she saw the patient a couple weeks ago to take her clothing and to discuss placement. She states that the patient was engaging at that time. She states that she does not believe that the patient has been compliant with taking her medications. She states that the patient had a similar episode about two  months ago and was sent to [facility].   Per nursing, patient does not answer questions. Patient observed wiping the room down with wipes and pulling supplies out of the cabinets. Patient observed pushing buttons on the posey alarm system.  Patient is known to the behavioral health services. I personally evaluated the patient on 09/17/21 and the patient was noted to have engaged in the assessment and answered questions appropriately. At this time, patient appears to have decompensated and somewhat psychotic. Will recommend inpatient psych treatment.   Past Psychiatric History: history of schizoaffective disorder.   Risk to Self:  UTA Risk to Others:  UTA Prior Inpatient Therapy:  yes Prior Outpatient Therapy: [facility]  ACT Team.   Past Medical History:  Past Medical History:  Diagnosis Date   Abnormal Pap smear    ASC-cannot exclude HGSIL on Pap 02/15/2012   ASC-US on 02/03/2012 pap (associated Trichomonas infection). No reflex HPV testing performed on specimen.  Patient informed that she will need repeat Pap in one year.       Asthma    ATTENTION DEFICIT, W/O HYPERACTIVITY, History of 06/30/2006   Qualifier: History of  By: McDiarmid MD, Jae Dire, GONOCOCCAL, History of 01/09/2007   Qualifier: History of  By: McDiarmid MD, Charlann Boxer ACUMINATA, HISTORY OF 05/12/2009   Qualifier: History of  By: McDiarmid MD, Todd     Depression    Diabetes mellitus    diet controlled   Eczema    Hypertension    Overactive bladder    Schizophrenia (HCC)  SCHIZOPHRENIA, CATATONIC, HISTORY OF 12/13/2006   Annotation: Diagnoses by  Dr. Dennie Bibleichard Larsen (Psych) At [facility]  [place], [place]. 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   Qualifier: Diagnosis of  By: McDiarmid MD, Benjaman Pottodd     TOBACCO USER 02/08/2009   Qualifier: Diagnosis of  By: Knox Royaltydell, Erin      Past Surgical History:  Procedure Laterality Date    INCISION AND DRAINAGE     pilanodal cyst   TOOTH EXTRACTION N/A 10/07/2017   Procedure: EXTRACTION TEETH NUMBERS ONE, SEVENTEEN, NINETEEN AND THIRTY TWO;  Surgeon: Ocie DoyneJensen, Scott, DDS;  Location: [facility];  Service: Oral Surgery;  Laterality: N/A;   Family History:  Family History  Adopted: Yes  Problem Relation Age of Onset   Bipolar disorder Sister    Alcohol abuse Brother    Cancer Father    Diabetes Mother    Family Psychiatric  History: UTA Social History:  Social History   Substance and Sexual Activity  Alcohol Use No   Comment: occ     Social History   Substance and Sexual Activity  Drug Use No    Social History   Socioeconomic History   Marital status: Single    Spouse name: Not on file   Number of children: Not on file   Years of education: Not on file   Highest education level: Not on file  Occupational History   Not on file  Tobacco Use   Smoking status: Every Day    Packs/day: 2.00    Years: 10.00    Pack years: 20.00    Types: Cigarettes   Smokeless tobacco: Never  Vaping Use   Vaping Use: Never used  Substance and Sexual Activity   Alcohol use: No    Comment: occ   Drug use: No   Sexual activity: Yes    Birth control/protection: Injection  Other Topics Concern   Not on file  Social History Narrative   Adopted   Has Guardian   [place] [name]   Transportation: Bus   Social Determinants of Health   Financial Resource Strain: Not on file  Food Insecurity: Not on file  Transportation Needs: Not on file  Physical Activity: Not on file  Stress: Not on file  Social Connections: Not on file   Additional Social History:    Allergies:   Allergies  Allergen Reactions   Abilify [Aripiprazole] Other (See Comments)    Thinks it's nasty- does not want it.  Injection is ok.      Labs:  Results for orders placed or performed during the hospital encounter of 09/21/21 (from the past 48 hour(s))  Comprehensive metabolic  panel     Status: Abnormal   Collection Time: 09/21/21  6:39 PM  Result Value Ref Range   Sodium 133 (L) 135 - 145 mmol/L   Potassium 3.6 3.5 - 5.1 mmol/L   Chloride 102 98 - 111 mmol/L   CO2 24 22 - 32 mmol/L   Glucose, Bld 105 (H) 70 - 99 mg/dL    Comment: Glucose reference range applies only to samples taken after fasting for at least 8 hours.   BUN 7 6 - 20 mg/dL   Creatinine, Ser 0.980.69 0.44 - 1.00 mg/dL   Calcium 8.9 8.9 - 11.910.3 mg/dL   Total Protein 6.9 6.5 - 8.1 g/dL   Albumin 3.5 3.5 - 5.0 g/dL   AST  20 15 - 41 U/L   ALT 13 0 - 44 U/L   Alkaline Phosphatase 98 38 - 126 U/L   Total Bilirubin 0.2 (L) 0.3 - 1.2 mg/dL   GFR, Estimated >06 >30 mL/min    Comment: (NOTE) Calculated using the CKD-EPI Creatinine Equation (2021)    Anion gap 7 5 - 15    Comment: Performed at [facility], [address] [place], [place] [address]  Ethanol     Status: None   Collection Time: 09/21/21  6:39 PM  Result Value Ref Range   Alcohol, Ethyl (B) <10 <10 mg/dL    Comment: (NOTE) Lowest detectable limit for serum alcohol is 10 mg/dL.  For medical purposes only. Performed at [facility], [address], [place], [place] [address]   CBC with Diff     Status: Abnormal   Collection Time: 09/21/21  6:39 PM  Result Value Ref Range   WBC 12.5 (H) 4.0 - 10.5 K/uL   RBC 3.96 3.87 - 5.11 MIL/uL   Hemoglobin 11.4 (L) 12.0 - 15.0 g/dL   HCT 57.3 (L) 22.0 - 25.4 %   MCV 88.4 80.0 - 100.0 fL   MCH 28.8 26.0 - 34.0 pg   MCHC 32.6 30.0 - 36.0 g/dL   RDW 27.0 62.3 - 76.2 %   Platelets 516 (H) 150 - 400 K/uL   nRBC 0.0 0.0 - 0.2 %   Neutrophils Relative % 55 %   Neutro Abs 6.9 1.7 - 7.7 K/uL   Lymphocytes Relative 32 %   Lymphs Abs 4.0 0.7 - 4.0 K/uL   Monocytes Relative 9 %   Monocytes Absolute 1.1 (H) 0.1 - 1.0 K/uL   Eosinophils Relative 3 %   Eosinophils Absolute 0.3 0.0 - 0.5 K/uL   Basophils Relative 1 %   Basophils Absolute 0.1 0.0 - 0.1 K/uL   Immature Granulocytes 0 %    Abs Immature Granulocytes 0.05 0.00 - 0.07 K/uL    Comment: Performed at [facility], [address], [place], [place] [address]  Acetaminophen level     Status: Abnormal   Collection Time: 09/21/21  6:39 PM  Result Value Ref Range   Acetaminophen (Tylenol), Serum <10 (L) 10 - 30 ug/mL    Comment: (NOTE) Therapeutic concentrations vary significantly. A range of 10-30 ug/mL  may be an effective concentration for many patients. However, some  are best treated at concentrations outside of this range. Acetaminophen concentrations >150 ug/mL at 4 hours after ingestion  and >50 ug/mL at 12 hours after ingestion are often associated with  toxic reactions.  Performed at [facility], [address], [place], [place] [address]   Salicylate level     Status: Abnormal   Collection Time: 09/21/21  6:39 PM  Result Value Ref Range   Salicylate Lvl <7.0 (L) 7.0 - 30.0 mg/dL    Comment: Performed at [facility], [address], [place], [place] [address]  Lithium level     Status: Abnormal   Collection Time: 09/21/21  6:39 PM  Result Value Ref Range   Lithium Lvl <0.06 (L) 0.60 - 1.20 mmol/L    Comment: Performed at [facility], [address], [place], [place] [address]  I-Stat beta hCG blood, ED     Status: None   Collection Time: 09/21/21  6:57 PM  Result Value Ref Range   I-stat hCG, quantitative <5.0 <5 mIU/mL   Comment 3  Comment:   GEST. AGE      CONC.  (mIU/mL)   <=1 WEEK        5 - 50     2 WEEKS       50 - 500     3 WEEKS       100 - 10,000     4 WEEKS     1,000 - 30,000        FEMALE AND NON-PREGNANT FEMALE:     LESS THAN 5 mIU/mL   Urine rapid drug screen (hosp performed)     Status: None   Collection Time: 09/22/21  4:41 AM  Result Value Ref Range   Opiates NONE DETECTED NONE DETECTED   Cocaine NONE DETECTED NONE DETECTED   Benzodiazepines NONE DETECTED NONE DETECTED   Amphetamines NONE DETECTED NONE DETECTED   Tetrahydrocannabinol  NONE DETECTED NONE DETECTED   Barbiturates NONE DETECTED NONE DETECTED    Comment: (NOTE) DRUG SCREEN FOR MEDICAL PURPOSES ONLY.  IF CONFIRMATION IS NEEDED FOR ANY PURPOSE, NOTIFY LAB WITHIN 5 DAYS.  LOWEST DETECTABLE LIMITS FOR URINE DRUG SCREEN Drug Class                     Cutoff (ng/mL) Amphetamine and metabolites    1000 Barbiturate and metabolites    200 Benzodiazepine                 200 Tricyclics and metabolites     300 Opiates and metabolites        300 Cocaine and metabolites        300 THC                            50 Performed at [facility], [address] [place], [place] [address]     Medications:  Current Facility-Administered Medications  Medication Dose Route Frequency Provider Last Rate Last Admin   lithium carbonate (ESKALITH) CR tablet 450 mg  450 mg Oral q AM Kommor, Madison, MD       lithium carbonate (LITHOBID) CR tablet 600 mg  600 mg Oral QHS Kommor, Madison, MD       paliperidone (INVEGA) 24 hr tablet 12 mg  12 mg Oral q AM Kommor, Madison, MD       Current Outpatient Medications  Medication Sig Dispense Refill   cloZAPine (CLOZARIL) 100 MG tablet Take 3 tablets (300 mg total) by mouth 2 (two) times daily. (Patient taking differently: Take 100 mg by mouth every morning.) 60 tablet 0   clozapine (CLOZARIL) 200 MG tablet Take 200 mg by mouth at bedtime.     folic acid (FOLVITE) 1 MG tablet Take 1 mg by mouth every morning.     lithium carbonate (ESKALITH) 450 MG CR tablet Take 450 mg by mouth in the morning.     lithium carbonate (LITHOBID) 300 MG CR tablet Take 600 mg by mouth at bedtime.     paliperidone (INVEGA) 6 MG 24 hr tablet Take 12 mg by mouth in the morning.      Psychiatric Specialty Exam:  Presentation  General Appearance: Appropriate for Environment  Eye Contact:Minimal  Speech:Slow  Speech Volume:Decreased  Handedness:Right   Mood and Affect  Mood:-- (UTA)  Affect:Flat   Thought Process  Thought  Processes:Other (comment) (UTA)  Descriptions of Associations:Circumstantial  Orientation:Full (Time, Place and Person)  Thought Content:-- (UTA)  History of Schizophrenia/Schizoaffective disorder:Yes  Duration of  Psychotic Symptoms:Greater than six months  Hallucinations:Hallucinations: Other (comment) (UTA)  Ideas of Reference:None  Suicidal Thoughts:Suicidal Thoughts: -- (UTA)  Homicidal Thoughts:Homicidal Thoughts: -- (UTA)   Sensorium  Memory:Immediate Fair  Judgment:UTA  Insight:UTA  Executive Functions  Concentration:  Attention Span:  Recall:  Fund of Knowledge:  Language:  Psychomotor Activity  Psychomotor Activity:  Assets  Assets:UTA  Sleep  Sleep:UTA  Physical Exam: Physical Exam Cardiovascular:     Rate and Rhythm: Normal rate.  Pulmonary:     Effort: Pulmonary effort is normal.  Neurological:     Mental Status: She is alert.   Review of Systems  Unable to perform ROS: Psychiatric disorder  Blood pressure (!) 122/99, pulse 65, temperature 98.7 F (37.1 C), temperature source Oral, resp. rate 19, SpO2 100 %, unknown if currently breastfeeding. There is no height or weight on file to calculate BMI.  Treatment Plan Summary: Daily contact with patient to assess and evaluate symptoms and progress in treatment, Medication management, and Plan Inpatient psychiatric treatment. Psychiatry CSW to assist with placement. Patient faxed out for inpt placement.  Current medication regimen.   lithium carbonate  450 mg Oral q AM   lithium carbonate  600 mg Oral QHS   paliperidone  12 mg Oral q AM  Would recommend restarting home mediation Clozapine once verified.   Labs reviewed: QTC on 08/22/21 show prolonged QTC 520. EKG repeat ordered. Lithium level 0.06.   Disposition: Recommend psychiatric Inpatient admission when medically cleared.  This service was provided via telemedicine using a 2-way, interactive audio and video technology.  Names of  all persons participating in this telemedicine service and their role in this encounter. Name: Biagio Borg  Role: Patient   Name: Liborio Nixon  Role: NP  Name:  Role:   Name:  Role:     Layla Barter, NP 09/22/2021 1:55 PM

## 2021-09-22 NOTE — ED Notes (Signed)
Patient seen lying in bed with head covered by blanket. Patient is moving foot in rhythmic motion indicating that patient is awake, however patient does not answer questions with verbal response or gestures.

## 2021-09-22 NOTE — ED Notes (Signed)
Sitter at bedside.

## 2021-09-22 NOTE — ED Notes (Signed)
Patient refused vital signs, would not answer any questions.

## 2021-09-22 NOTE — ED Provider Notes (Signed)
Emergency Medicine Observation Re-evaluation Note  Molly [name] is a 36 y.o. female, seen on rounds today.  Pt initially presented to the ED for complaints of Mental Health Problem Currently, the patient is agitated, aggressive, violent.  Physical Exam  BP (!) 122/99   Pulse 65   Temp 98.7 F (37.1 C) (Oral)   Resp 19   LMP  (LMP Unknown)   SpO2 100%  Physical Exam General: Labile temperament, noncooperative Cardiac: Regular rate and rhythm Lungs: No increased work of breathing with verbal aggressiveness Psych: Delusional, paranoid, not cooperative, labile, threatening  ED Course / MDM  EKG:   I have reviewed the labs performed to date as well as medications administered while in observation.  Recent changes in the last 24 hours include change in behavior to threatening demeanor with labile temperament and violence.  Plan  Current plan is for behavioral health evaluation, patient is now under involuntary commitment.   Gerhard Munch, MD 09/22/21 1425

## 2021-09-22 NOTE — BH Assessment (Addendum)
Comprehensive Clinical Assessment (CCA) Screening, Triage and Referral Note  09/22/2021 Molly [name] [id number] Disposition: Clinician discussed patient care with Molly [name].  He recommended observing patient overnight then having CSW assist with contacting guardian and ACTT provider.  Clinician informed RN [name] of recommended disposition via secure messaging.    Pt does not give any eye contact.  Pt may not be oriented to date.  She knew she was at the hospital.  Pt denies hearing voices.  Pt is currently homeless. It is unknown if she is sleeping or eating well considering her homelessness.    Patient is receiving ACTT services from [facility], their crisis number is [phone].   Chief Complaint:  Chief Complaint  Patient presents with   Mental Health Problem   Visit Diagnosis: Schizophrenia  Patient Reported Information How did you hear about [place]? Other (Comment) (Pt had asked someone to call EMS for her back pain.)  What Is the Reason for Your Visit/Call Today? Per note from RN [name], "Pt transported to ED by [facility], pt ask individual to call 911 for back pain, EMS reports pt nonverbal during transport, someone on scene knew pts caseworker and was able to contact her and let them know she was coming to the ED for evaluation.   During triage pt is nonverbal, disheveled, strong body odor. Pt noted to be wearing multiple undergarments and a formal gown."  This clinician was not able to get much information from patient.  Pt says "yes" when asked if she is taking her medications although her depakote level is very low, indicating non-compliance with medications.  Pt denies SI.  When asked further questions patient does not respond.  Pt did pay a little attention when asked if Molly [name] was her guardian but she did not clarify if Molly [name] was her current guardian.  Pt is a ward of [facility] [phone].  Pt was seen at [facility] on 09/17/21.  At that time patient's ACT team ([facility])  was contacted.  The guardian said that she would contact [facility] to see if they can meet patient at [facility] that day.  Pt was given a bus pass to get to [facility].  Patient's guardian Molly [name] was aslo contacted.  She said that patient is homeless and has been non-compliant with medications lately.  [name] at [facility] was notified of patient's location tonight.  How Long Has This Been Causing You Problems? <Week  What Do You Feel Would Help You the Most Today? Treatment for Depression or other mood problem   Have You Recently Had Any Thoughts About Hurting Yourself? No  Are You Planning to Commit Suicide/Harm Yourself At This time? No   Have you Recently Had Thoughts About Hurting Someone Molly [name]? No  Are You Planning to Harm Someone at This Time? No  Explanation: No data recorded  Have You Used Any Alcohol or Drugs in the Past 24 Hours? No  How Long Ago Did You Use Drugs or Alcohol? No data recorded What Did You Use and How Much? No data recorded  Do You Currently Have a Therapist/Psychiatrist? Yes  Name of Therapist/Psychiatrist: [facility] ACT[facility] [phone]   Have You Been Recently Discharged From Any Office Practice or Programs? No  Explanation of Discharge From Practice/Program: No data recorded   CCA Screening Triage Referral Assessment Type of Contact: Tele-Assessment  Telemedicine Service Delivery:   Is this Initial or Reassessment? Initial Assessment  Date Telepsych consult ordered in [facility]:  09/21/21  Time  Telepsych consult ordered in [facility]:  1904  Location of Assessment: [facility] ED  Provider Location: [facility] Assessment Services   Collateral Involvement: [facility] ACTT [phone]   Does Patient Have a Court Appointed Legal Guardian? No data recorded Name and Contact of Legal Guardian: No data recorded If Minor and Not Living with Parent(s), Who has Custody? No data recorded Is CPS involved or ever been involved? No data recorded Is [name] involved or ever been involved? No data  recorded  Patient Determined To Be At Risk for Harm To Self or Others Based on Review of Patient Reported Information or Presenting Complaint? No  Method: No data recorded Availability of Means: No data recorded Intent: No data recorded Notification Required: No data recorded Additional Information for Danger to Others Potential: No data recorded Additional Comments for Danger to Others Potential: No data recorded Are There Guns or Other Weapons in Your Home? No data recorded Types of Guns/Weapons: No data recorded Are These Weapons Safely Secured?                            No data recorded Who Could Verify You Are Able To Have These Secured: No data recorded Do You Have any Outstanding Charges, Pending Court Dates, Parole/Probation? No data recorded Contacted To Inform of Risk of Harm To Self or Others: No data recorded  Does Patient Present under Involuntary Commitment? No  IVC Papers Initial File Date: No data recorded  [place] of Residence: [place] (Homeless in [place])   Patient Currently Receiving the Following Services: ACTT Psychologist, educational) ([facility] ACTT team)   Determination of Need: Routine (7 days)   Options For Referral: Other: Comment (Observation of patient then CSW to assist with contacting ACTT provider.)   Discharge Disposition:     [place]and[place] Lodge, LCAS

## 2021-09-22 NOTE — ED Notes (Signed)
Patient slamming the door to the room and in the room throwing things. Geodon ordered by physician. Security at bedside to assist with medicating patient.

## 2021-09-22 NOTE — ED Notes (Signed)
Patient refused to move to new room in ED. Security called to assist in patient transfer. Patient walked to new area with security and RN, patient became physically aggressive to bed and objects in room. ED Provider made aware.

## 2021-09-23 MED ORDER — ZIPRASIDONE MESYLATE 20 MG IM SOLR
20.0000 mg | Freq: Once | INTRAMUSCULAR | Status: AC
  Administered 2021-09-23: 20 mg via INTRAMUSCULAR
  Filled 2021-09-23: qty 20

## 2021-09-23 MED ORDER — ZIPRASIDONE MESYLATE 20 MG IM SOLR
20.0000 mg | Freq: Once | INTRAMUSCULAR | Status: AC
Start: 2021-09-23 — End: 2021-09-23
  Administered 2021-09-23: 20 mg via INTRAMUSCULAR
  Filled 2021-09-23: qty 20

## 2021-09-23 MED ORDER — STERILE WATER FOR INJECTION IJ SOLN
INTRAMUSCULAR | Status: AC
  Administered 2021-09-23: 1.2 mL
  Filled 2021-09-23: qty 10

## 2021-09-23 NOTE — ED Notes (Addendum)
Upon arrival to the unit patient is already escalated and yelling and crying in room; RN went to check on patient and found food all over the walls and and unknown yellow substance covering the entire floor; It was reported that patient had thrown tray in room; RN and Sitter advised patient that room needed to be cleaned; pt yelling it does not; Security called and staff in unit assisted RN in cleaning food particles off the walls and floors; Patient bed lien changed; pt walked out to desk and grabbed glucose meter and when asked to put it down patient threw it at the RN; Patient given already ordered Geodon; Patient came out again and grabbed the glucose meter and RN had to take it out of her hand. RN has removed all items from the desk and placed on  back wall for safety of unit. Patient ran off unit and had to be brought back in by [facility]

## 2021-09-23 NOTE — ED Notes (Signed)
Pt lying on floor of shower with under ware on. Pt refused to stand and remove under ware.

## 2021-09-23 NOTE — ED Notes (Signed)
Pt pulled Code leaver in room multiple times.

## 2021-09-23 NOTE — ED Notes (Signed)
Pt voids on her room floor even though Pt is ambulatory and A/O . Pt has a blanket that is saturated with urine wrapped around her waist. Pt drags this wet blanket around room .

## 2021-09-23 NOTE — ED Notes (Signed)
Have not obtained pt's vital signs at this time. Pt is asleep. Will attempt to get vital signs when pt wakes up. RN Beckie Busing is aware.

## 2021-09-23 NOTE — ED Provider Notes (Signed)
Emergency Medicine Observation Re-evaluation Note  Molly [name] is a 36 y.o. female, seen on rounds today.  Pt initially presented to the ED for complaints of behavioral health evaluation. Pt under ivc, awaiting placement. Period agitated behavior last night, currently calm/resting.   Physical Exam  BP (!) 140/96 (BP Location: Left Arm)   Pulse (!) 54   Temp 98 F (36.7 C) (Oral)   Resp 20   LMP  (LMP Unknown)   SpO2 98%  Physical Exam General: calm, resting.  Cardiac: regular rate Lungs: breathing comfortably Psych: resting comfortably, calm.   ED Course / MDM   I have reviewed the labs performed to date as well as medications administered while in observation.  Recent changes in the last 24 hours include ED obs, med management, and BH reassessment.   Plan   Molly [name] is under involuntary commitment.   Will ask BH team to reassess, continue med management, and update disposition plan for patient.      Cathren Laine, MD 09/23/21 1003

## 2021-09-23 NOTE — ED Notes (Signed)
Pt refuses vitals  

## 2021-09-23 NOTE — ED Notes (Signed)
Patient still awake roaming the unit attempting to pull something off of the computer; Pt provided snack at this time-Monique,RN

## 2021-09-23 NOTE — ED Notes (Signed)
Pt dropped a drink from dinner tray and then stood up and tossed rest of tray on floor. Pt then bent over to pick up tray and shoved bed side tray into wall.

## 2021-09-23 NOTE — ED Notes (Signed)
Pt asked for a snack and soda, this tech and Monique, RN advised that she couldn't have a snack or soda at this time. This tech gave her a cup of water, she's sitting on the side of the bed at this time.

## 2021-09-23 NOTE — ED Notes (Signed)
Patient came out of room and stared at staff; Patient would not respond when asked what she needed. After about 5 minutes patient threw herself against the door and slide to the ground; Patient walked out of unit with [employer] behind her; pt escorted back to purple and pushed the door open on RN who responded to the unit; Patient in the room yelling and slamming the door; EDP notified for emergency med; Charge RN has responded to unit and security and [employer] remain in unit for safety-Monique,RN

## 2021-09-23 NOTE — ED Notes (Signed)
Pt in bed now crying loudly.

## 2021-09-23 NOTE — ED Notes (Signed)
Pt at Staff desk making a TC . Pt started hitting Phone . Pt then picked up telephone and slammed back on desk.

## 2021-09-23 NOTE — ED Notes (Signed)
TTS done 

## 2021-09-23 NOTE — ED Notes (Signed)
Pt in her room slamming the drawers and cabinet doors. She steps out of her room every so often, when asked if there's anything we can do for her, she goes back to her room and slams her door.

## 2021-09-23 NOTE — ED Notes (Signed)
PT refuses vitals.

## 2021-09-23 NOTE — ED Notes (Signed)
With assistance of Security Staff bottle cap ring removed from her fingers. Pt had multiple tight hair wraps around her forearm leaving marks on skin. Pt also had red hair band and black cap on. NT Izora Gala assisted with encouraging Pt to remove these items.

## 2021-09-23 NOTE — ED Notes (Signed)
Pt in her room lying on her back screaming and crying

## 2021-09-23 NOTE — ED Notes (Signed)
Pt is in bed.

## 2021-09-24 MED ORDER — LORAZEPAM 2 MG/ML IJ SOLN
2.0000 mg | Freq: Once | INTRAMUSCULAR | Status: AC
  Administered 2021-09-24: 2 mg via INTRAMUSCULAR
  Filled 2021-09-24: qty 1

## 2021-09-24 MED ORDER — ZIPRASIDONE MESYLATE 20 MG IM SOLR
20.0000 mg | INTRAMUSCULAR | Status: AC | PRN
  Administered 2021-09-24: 20 mg via INTRAMUSCULAR
  Filled 2021-09-24: qty 20

## 2021-09-24 MED ORDER — DIPHENHYDRAMINE HCL 50 MG/ML IJ SOLN
50.0000 mg | Freq: Once | INTRAMUSCULAR | Status: AC
  Administered 2021-09-24: 50 mg via INTRAMUSCULAR

## 2021-09-24 MED ORDER — STERILE WATER FOR INJECTION IJ SOLN
INTRAMUSCULAR | Status: AC
  Administered 2021-09-24: 1.2 mL
  Filled 2021-09-24: qty 10

## 2021-09-24 MED ORDER — RISPERIDONE 1 MG PO TBDP
2.0000 mg | ORAL_TABLET | Freq: Three times a day (TID) | ORAL | Status: DC | PRN
  Administered 2021-09-24 – 2021-09-29 (×5): 2 mg via ORAL
  Filled 2021-09-24 (×7): qty 2

## 2021-09-24 MED ORDER — HALOPERIDOL LACTATE 5 MG/ML IJ SOLN
2.0000 mg | Freq: Once | INTRAMUSCULAR | Status: AC
  Administered 2021-09-24: 2 mg via INTRAMUSCULAR
  Filled 2021-09-24: qty 1

## 2021-09-24 MED ORDER — ZIPRASIDONE MESYLATE 20 MG IM SOLR
20.0000 mg | Freq: Once | INTRAMUSCULAR | Status: AC
  Administered 2021-09-24: 20 mg via INTRAMUSCULAR
  Filled 2021-09-24: qty 20

## 2021-09-24 MED ORDER — STERILE WATER FOR INJECTION IJ SOLN
INTRAMUSCULAR | Status: AC
  Filled 2021-09-24: qty 10

## 2021-09-24 MED ORDER — LORAZEPAM 1 MG PO TABS
1.0000 mg | ORAL_TABLET | ORAL | Status: AC | PRN
  Administered 2021-09-24: 1 mg via ORAL
  Filled 2021-09-24: qty 1

## 2021-09-24 MED ORDER — DIPHENHYDRAMINE HCL 50 MG/ML IJ SOLN
50.0000 mg | Freq: Once | INTRAMUSCULAR | Status: DC
  Filled 2021-09-24: qty 1

## 2021-09-24 NOTE — Progress Notes (Signed)
Patient has been denied by [facility] due to staffing issues on the unit. Patient meets BH inpatient criteria per Liborio Nixon, NP. Patient has been faxed out to the following facilities:    [facility]  [address], [place] [address] [phone]  [facility]  [address] [place] [address] [phone]  [facility]  [address] [place] [address] [phone]  [facility]  [address], [place] [address] [phone]  [facility]  [address] [place] [address] [phone]  [facility]  [address], [place] [address] [phone]  [facility]  [address] [place], [place] [address] [phone]  [facility]  [address] [place], [place] [address] [phone]  [facility]  [phone] [place] [place] [address] [phone]  [facility]  [address] [place] [address] [phone]  [facility]  [address] [place] [address] [phone]  [facility]  [address] [place] [address] [phone]  [facility]  [address] [place] [address] [phone]   Damita Dunnings, MSW, [name]-A  9:24 PM 09/24/2021

## 2021-09-24 NOTE — ED Notes (Signed)
Patient continues to get up from bed and slam her door shut. Sitter at bedside verbalized to patient that she needs to keep her door open at this time. Patient crying on and off in room. Provider made aware. This RN requesting PRN medication at this time.

## 2021-09-24 NOTE — ED Notes (Signed)
This RN with multiple security staff attempting to verbally deescalate patient with no success. Patient continues to scream at staff and be verbally aggressive. This RN to administer ordered IM medications with assistance from security at this time.

## 2021-09-24 NOTE — ED Provider Notes (Signed)
Emergency Medicine Observation Re-evaluation Note  OVETA IDRIS is a 36 y.o. female, seen on rounds today.  Pt initially presented to the ED for complaints of Mental Health Problem Currently, the patient is under IVC and awaiting placement.  Physical Exam  BP (!) 141/83 (BP Location: Left Arm)   Pulse 64   Temp 98.4 F (36.9 C) (Oral)   Resp 19   LMP  (LMP Unknown)   SpO2 96%  Physical Exam General: Moderate agitation and wandering requiring additional sedation medication. Cardiac: well perfused Lungs: Even, unlabored respirations Psych: Moderate agitation this am.   ED Course / MDM  EKG:   I have reviewed the labs performed to date as well as medications administered while in observation.  Recent changes in the last 24 hours include sedation medication required this AM.  Plan  Current plan is for additional sedation medication and TTS placement. FRANCESCA STROME is under involuntary commitment.      Maia Plan, MD 09/27/21 1241

## 2021-09-24 NOTE — ED Notes (Signed)
Patient asking for snack and drink. This RN verbalized to sitter that patient may have a snack and drink at this time.

## 2021-09-24 NOTE — ED Notes (Signed)
Patient continues to stand at nurses station although there has been multiple requests for patient to go back into her room. Patient refusing to go into her room at this time and continues to purposefully cough at staff and on nurses station counter. Multiple security at bedside at this time to redirect patient back into her room.

## 2021-09-24 NOTE — ED Notes (Signed)
This RN witnessed patient pulling cord off of BP cuff and placing it in her underwear. This RN asked patient to remove cord from her underwear and give it to this RN as it was a safety hazard. Patient refused and stated that there was nothing in her underwear. Security and sitter also at bedside and after this RN and sitter lifted up patient's shirt, cord was removed from the top of patient's underwear. Patient in room at this time and continues to pace around room and slam her door.

## 2021-09-24 NOTE — ED Notes (Signed)
Patient in room banging her body against the door and pulling  the code button; pt demanding med to help her sleep; EDP notified and med ordered; Consulting civil engineer and security to unit for [employer]

## 2021-09-24 NOTE — ED Notes (Signed)
Patient out of room standing at nurses station asking for chap stick. This RN verbalized to patient that she does not have any chapstick. Patient continues to stand at nurses station and stare at this RN. Security escorting patient back to room at this time.

## 2021-09-24 NOTE — ED Notes (Signed)
Patient continues to refuse ordered EKG. Will attempt again later.

## 2021-09-24 NOTE — ED Notes (Signed)
Patient pulling code button although it has been verbalized to her multiple times to not touch any of these buttons. Patient then tried to leave room and attempting to push security out of the way. Security standing in door way directing patient back into her bed.

## 2021-09-24 NOTE — ED Notes (Signed)
Patient up in shower at this time but left her clothes on. This RN and sitter in patient's room to change patient's linens because patient urinated in bed. Patient then walked out of shower with clothes on and stood at the door way asking what we were doing in her room. This RN and sitter verbalized to patient that she needed to go and dry off with a towel because there were puddles of water everywhere. Patient then walked back to shower but wrapped a brief and mesh underwear around her waist and continued to shower. Clean scrubs provided to patient but patient put on clean scrubs and refused to dry herself off.

## 2021-09-24 NOTE — ED Notes (Signed)
Patient laying on bed, eyes closed, respirations even and unlabored.

## 2021-09-24 NOTE — ED Notes (Signed)
Patient standing in door way asking for a snack although a snack has already been given to her. Patient then continuously asks for coffee although this RN has verbalized to patient multiple times that we don't have any coffee available. Patient continues to stand in door way and ask for snacks over and over again.

## 2021-09-24 NOTE — ED Notes (Addendum)
While administering IM medications patient attempting to hit and kick staff. Patient bit security and attempted to spit at staff. Patient continues to yell and scream at the top of her lungs. Patient also had paper clip wrapped around her finger that was removed by this RN with assistance from security. All objects including lotion, spray, and toothbrush removed from patient's room at this time. Patient continues to stand in doorway yelling at security stating "Get the fuck out of my way I need to close my fucking door." This RN and security verbalizing to patient that the door will not be closed.

## 2021-09-24 NOTE — ED Notes (Signed)
Pt refuses all PO meds. Geodon given at this time

## 2021-09-24 NOTE — ED Notes (Signed)
Patient came to nurses station and went back in room and slammed door. Patient then began screaming loudly at the top of her lungs and slammed door again. Patient currently sitting up against door and will not open door. Security attempting to open door. Provider made aware will order meds a this time.

## 2021-09-24 NOTE — ED Notes (Signed)
Pt to the nurse's station to ask for "my medications".  Pt instructed to return to her room so that the RN may provide her with the ordered medications.  Pt not responding to RN at this time.  Security standing.

## 2021-09-24 NOTE — ED Notes (Signed)
Patient sitting on bed eating dinner tray that was provided to her at this time.

## 2021-09-25 MED ORDER — STERILE WATER FOR INJECTION IJ SOLN
INTRAMUSCULAR | Status: AC
  Administered 2021-09-25: 10 mL
  Filled 2021-09-25: qty 10

## 2021-09-25 MED ORDER — ZIPRASIDONE MESYLATE 20 MG IM SOLR
INTRAMUSCULAR | Status: AC
  Administered 2021-09-25: 20 mg via INTRAMUSCULAR
  Filled 2021-09-25: qty 20

## 2021-09-25 NOTE — ED Notes (Signed)
Pt awake now, opened door to her room and is wandering around room. Exited her room and quickly walked into bathroom without addressing staff.

## 2021-09-25 NOTE — ED Notes (Signed)
Patient has come out of her room multiple times, she has taken a blue bag and security had to get it back from her, then she attempted to remove some items from the trash. She refused to return to her room and started kicking security.

## 2021-09-25 NOTE — ED Notes (Addendum)
Patient ambulates to the restroom then goes back to her room and retrieves her cup. Patient then goes to the restroom again with cup in hand and comes out of restroom with a cup of urine and states it is her "soda." Patient refuses to give this RN the cup. Security assists and retrieves the cup from the patient. This RN gives patient a cup of diet sprite.

## 2021-09-25 NOTE — ED Notes (Signed)
Pt unlocked her bed and attempted to move bed for a third time in a row. Staff re-entered room with security, repositioned bed, and locked bed. This RN informed pt that her bed is provided for her comfort, and if she continues to ignore staff and unlock bed, move bed, and/or attempt to pull cords out of the bed, then her hospital bed will be removed and she will be provided with stretcher pad as replacement for safety measures. Pt acknowledged this Therapist, sports.

## 2021-09-25 NOTE — ED Notes (Signed)
Pt witnessed by sitter to be peeing on the side of the bed. When sitter asked what patient was doing, patient ignored sitter. Patient was handed a towel to clean herself and her mess up. Patient responded by throwing herself on the bed and putting the towel on herself. Patient continues to be in her room sitting on her bed.

## 2021-09-25 NOTE — ED Notes (Signed)
Patient given crayons and paper, quietly sitting in her doorway coloring at this time.

## 2021-09-25 NOTE — ED Notes (Signed)
Pt returned self to her room from the bathroom. Once in her room, pt stood in her doorway and asked "Where is a marker? Is there a marker?" This RN responded that there are no markets available for pt to use. To this, pt asked "Are there any crayons?" This RN informed pt that there are no crayons at this time, either. Pt did not reply and went back into her room.

## 2021-09-25 NOTE — ED Notes (Signed)
Patient observed to be agitated. She tossed several items off her bedside table and pushed the table in an aggressive manner. Patient then walked into her room and slammed the door. Patient currently sobbing loudly in her room.

## 2021-09-25 NOTE — ED Notes (Signed)
Pt seen attempting to shake and/or move her bed by the handles on the back of the bed and pacing around her room. Pt then unlocked her bed, advanced it away from the wall, and began attempting to pull cords out of the bed itself. Staff and security entered pt's room, instructed pt to stop pulling on cords, and moved bed back against the wall. Bed in locked position by staff. After staff exited room, pt closed her door and the blinds on the door window. Blinds reopened

## 2021-09-25 NOTE — Progress Notes (Signed)
At 6:17am this CSW spoke with Intake about this pt and was advised that pt is currently under review.  At 10:49am CSW called [facility] for an update on review and this CSW was advised that Intake would call this CSW back.  Benjaman Kindler, MSW, LCSWA 09/25/2021 11:02 AM

## 2021-09-25 NOTE — ED Notes (Signed)
Patient provided with a soda and snack as she inquired about meal times. Pt continues to be calm and cooperative, sitting outside her room coloring.

## 2021-09-25 NOTE — ED Notes (Signed)
Patient attempted to walk out of purple zone and enter triage. When patient realized that she needed a badge to enter this portion of triage, patient knocked on the door. At this same time, a sitter bringing back a drink from triage, opened the door, letting this pt into triage. Triage staff, security, [employer], and this RN were able to escort patient back to her room.

## 2021-09-25 NOTE — Progress Notes (Signed)
Pt was accepted [facility] Monday 09/28/21 after 8:00am  Pt meets inpatient criteria per Liborio Nixon, NP  Attending Physician will be Dr. Jola Babinski  Report can be called to: [phone]  Pt can arrive Monday after 8:00am   Nursing staff [facility] [place], RN  Kelton Pillar, LCSWA 09/25/2021 @ 2:09 PM H

## 2021-09-25 NOTE — ED Notes (Signed)
Automated voice on pt's bed indicated to staff that pt unlocked the brake on her bed and began moving it again. Staff re-entered pt's room with security, placed back back against wall, and re-locked bed. This RN and Ellard Artis, nurse extern, re-iterated to pt that the bed is for her comfort while she is here and is not to be moved or unlocked by pt. Pt did not verbally respond to staff, but stared at staff in response.

## 2021-09-25 NOTE — ED Notes (Signed)
Pt came out of room and walked straight to sitters WOW. She grabbed the pt label stickers from the printer on the [place] and would not give them back to the sitter upon request. When the sitter tried to grab stickers from the patient, the patient tossed her meal tray towards the sitter. Patient then tried to walk out the double doors of purple zone, but was apprehended by security. Patient did not respond to security and staff's commands to go back into her room until patient was escorted back to her room by security. Patient then proceeded to slam her door and then kick and yank at the hospital bed's power cord. It was at this moment that the patient also pulled the code blue button and proceeded to walk back out of her room and tried once more to leave purple zone. This RN then proceeded to grab pt's PRN Geodon. While this RN was in the medroom, security was able to get patient back into her room, where Geodon IM was given.

## 2021-09-25 NOTE — ED Notes (Signed)
Patient up in her room pacing around, patient has removed all of her clothing although multiple requests have been made to put her clothes back on. Patient currently wearing brief around her abdomen and mesh underwear as a bra.

## 2021-09-26 LAB — CBG MONITORING, ED: Glucose-Capillary: 102 mg/dL — ABNORMAL HIGH (ref 70–99)

## 2021-09-26 MED ORDER — ZIPRASIDONE MESYLATE 20 MG IM SOLR
20.0000 mg | Freq: Once | INTRAMUSCULAR | Status: AC
  Administered 2021-09-26: 20 mg via INTRAMUSCULAR
  Filled 2021-09-26: qty 20

## 2021-09-26 MED ORDER — STERILE WATER FOR INJECTION IJ SOLN
INTRAMUSCULAR | Status: AC
  Filled 2021-09-26: qty 10

## 2021-09-26 MED ORDER — BENZONATATE 100 MG PO CAPS
100.0000 mg | ORAL_CAPSULE | Freq: Once | ORAL | Status: DC
  Filled 2021-09-26: qty 1

## 2021-09-26 NOTE — ED Provider Notes (Signed)
Emergency Medicine Observation Re-evaluation Note  Molly [name] is a 36 y.o. female, seen on rounds today.  Pt initially presented to the ED for complaints of Mental Health Problem Currently, the patient is resting in their room.  Physical Exam  BP (!) 152/97 (BP Location: Left Arm)   Pulse 81   Temp 98.8 F (37.1 C) (Oral)   Resp 18   LMP  (LMP Unknown)   SpO2 100%  Physical Exam General: resting comfortably, NAD Lungs: normal WOB Psych: currently calm and resting  ED Course / MDM  EKG:EKG Interpretation  Date/Time:  Thursday Sep 24 2021 23:16:15 EDT Ventricular Rate:  76 PR Interval:  150 QRS Duration: 66 QT Interval:  382 QTC Calculation: 429 R Axis:   8 Text Interpretation: Normal sinus rhythm with sinus arrhythmia Minimal voltage criteria for LVH, may be normal variant ( R in aVL ) Cannot rule out Anterior infarct , age undetermined Abnormal ECG When compared with ECG of 22-Aug-2021 13:32, QT has shortened Left ventricular hypertrophy is now present Confirmed by Dione Booze ([id number]) on 09/25/2021 1:26:51 AM  I have reviewed the labs performed to date as well as medications administered while in observation.  Recent changes in the last 24 hours include none.  Plan  Current plan is for transfer to [facility] Monday 09/28/21. SHAILAH GIBBINS is under involuntary commitment.      Rozelle Logan, DO 09/26/21 1139

## 2021-09-26 NOTE — ED Notes (Signed)
Patient at the desk, trying to get her to agree to take something to help her calm down. Patient asked for something to help her sleep, but then stated she wouldn't take anything.

## 2021-09-26 NOTE — ED Notes (Signed)
Pt asked for a toothbrush and toothpaste as well as some deodorant. This RN gave her what she asked for. Patient currently in bathroom brushing her teeth.

## 2021-09-26 NOTE — ED Notes (Signed)
Patient wearing multiple non-clothing items that are too tight including a linen bag and pillow case. Patient refusing to take off items. Patient placed in hold by security for safety while items were cut off. Multiple plastic items found in patient's bra as well as cloth found wrapped around patient's neck. All items were removed/cut to ensure patient safety. Patient medicated with Geodon at that time due to increased agitation and aggression.

## 2021-09-26 NOTE — ED Notes (Signed)
Head board removed from bed due to patient trying to take it off. Security at bedside.

## 2021-09-26 NOTE — ED Notes (Signed)
Patient pulled code blue button. This RN came into room and educated patient on not pulling the code alarm. Patient acknowledged teaching and education and stated understanding.

## 2021-09-26 NOTE — ED Notes (Signed)
Patient continuously demanding for soap from the sitter and this RN. When staff asks for pt to go back into her room, patient continues to stand and stare at staff. Patient then proceeded to walk into shower room, fully clothed, and turned on the shower. Patient currently in shower room and did not ask for assistance from staff.

## 2021-09-27 MED ORDER — DIPHENHYDRAMINE HCL 25 MG PO CAPS
50.0000 mg | ORAL_CAPSULE | Freq: Once | ORAL | Status: AC
  Administered 2021-09-27: 50 mg via ORAL
  Filled 2021-09-27: qty 2

## 2021-09-27 MED ORDER — HYDROXYZINE HCL 50 MG PO TABS
50.0000 mg | ORAL_TABLET | Freq: Three times a day (TID) | ORAL | Status: DC | PRN
Start: 2021-09-27 — End: 2021-09-29
  Administered 2021-09-27 – 2021-09-29 (×3): 50 mg via ORAL
  Filled 2021-09-27 (×3): qty 1

## 2021-09-27 MED ORDER — ZIPRASIDONE MESYLATE 20 MG IM SOLR
20.0000 mg | Freq: Once | INTRAMUSCULAR | Status: DC
  Filled 2021-09-27 (×2): qty 20

## 2021-09-27 MED ORDER — STERILE WATER FOR INJECTION IJ SOLN
INTRAMUSCULAR | Status: AC
  Filled 2021-09-27: qty 10

## 2021-09-27 NOTE — ED Notes (Signed)
Patient threw her bedside tray with her dinner on it. Bedside table removed from room.

## 2021-09-27 NOTE — ED Notes (Signed)
Patient given warm blankets per request.

## 2021-09-27 NOTE — ED Notes (Signed)
Patient appears to be sleeping, with her head covered by her blanket.  Respirations noted to be even and unlabored.

## 2021-09-27 NOTE — ED Notes (Signed)
Patient given toothbrush and toothpaste per request, currently brushing teeth in the bathroom with the door open.

## 2021-09-27 NOTE — ED Notes (Signed)
Patient asking to make a phone call. Informed patient it was too late for making phone calls, that she would have an opportunity to make calls in the morning. Patient then asking to be discharged. Informed patient that she was being held for placement and was not going to be discharged. Patient then stated," I'm leaving", and attempted to walk off the unit. Security followed and stopped patient, she was escorted back to her room.

## 2021-09-27 NOTE — ED Notes (Addendum)
Patient initially agreed to let sitter obtain vital signs.  Patient then pushed sitter's hand away and refused.  Boundaries set by sitter.  Patient then slammed her door shut, and then slung herself against her door and onto her bed, screaming. Security at bedside.

## 2021-09-27 NOTE — ED Notes (Signed)
Breakfast order placed ?

## 2021-09-27 NOTE — ED Provider Notes (Signed)
Emergency Medicine Observation Re-evaluation Note  Molly [name] is a 36 y.o. female, seen on rounds today.  Pt initially presented to the ED for complaints of Mental Health Problem Currently, the patient is sitting in their room.  Physical Exam  BP (!) 148/79 (BP Location: Right Arm)   Pulse 100   Temp 98.2 F (36.8 C) (Oral)   Resp 20   LMP  (LMP Unknown)   SpO2 100%  Physical Exam General: resting comfortably, NAD Lungs: normal WOB Psych: currently calm and resting  ED Course / MDM  EKG:EKG Interpretation  Date/Time:  Thursday Sep 24 2021 23:16:15 EDT Ventricular Rate:  76 PR Interval:  150 QRS Duration: 66 QT Interval:  382 QTC Calculation: 429 R Axis:   8 Text Interpretation: Normal sinus rhythm with sinus arrhythmia Minimal voltage criteria for LVH, may be normal variant ( R in aVL ) Cannot rule out Anterior infarct , age undetermined Abnormal ECG When compared with ECG of 22-Aug-2021 13:32, QT has shortened Left ventricular hypertrophy is now present Confirmed by Delora Fuel ([id number]) on 09/25/2021 1:26:51 AM  I have reviewed the labs performed to date as well as medications administered while in observation.  Recent changes in the last 24 hours include none.  Plan  Current plan is for transfer to [facility]. Molly [name] is under involuntary commitment.      Lorelle Gibbs, DO 09/27/21 1132

## 2021-09-27 NOTE — ED Notes (Signed)
Patient noted to be showering in underwear and bra.  Patient assured that she had clean underwear waiting when she got out of the shower, that she didn't have to continue wearing the dirty ones.  Patient calm and agreeable.

## 2021-09-27 NOTE — ED Notes (Signed)
Patient given crayon and paper to color with.

## 2021-09-27 NOTE — ED Notes (Signed)
Patient now quiet, sitting on edge of bed.

## 2021-09-27 NOTE — ED Notes (Signed)
Pt at desk asking [employer] officer if she can be transferred to another facility or go to jail. This nurse explained to pt that the goal was to transfer her to another facility, there were no beds at this time. Sitting outside room at present. Slightly agitated, arms crossed across chest, staring at staff.

## 2021-09-27 NOTE — ED Notes (Signed)
Patient back in room, lights off.

## 2021-09-27 NOTE — ED Notes (Signed)
Patient showered and once again washed with the undergarments own. New scrubs are soaking wet from the wetness of the undergarments. Patient refusing to clean up after her self in the shower.

## 2021-09-27 NOTE — ED Notes (Addendum)
Patient now refusing to remove wet underwear and wet socks, stating "they're clean, I washed them."

## 2021-09-27 NOTE — ED Notes (Addendum)
Patient now at the nurses station drinking apple juice, requesting a snack. Patient given apple sauce.

## 2021-09-27 NOTE — ED Notes (Addendum)
As I was preparing Geodon injection patient states she doesn't want a shot, asking for the "pink pill I took yesterday". Patient agrees to take Risperdal, informed patient that she would also need to go quietly sit in her room and follow directions. Patient agreeable to request.

## 2021-09-27 NOTE — ED Notes (Signed)
Patient currently in shower.   

## 2021-09-27 NOTE — ED Notes (Signed)
Patient back in room, in NAD.

## 2021-09-27 NOTE — ED Notes (Signed)
C/o itching all over.  

## 2021-09-27 NOTE — ED Notes (Addendum)
Patient at nurses station requesting ice cream and a drink.  Patient informed we do not have ice cream, but was given a soda.  Patient also given clean change of scrubs and bed linens by sitter because patient is wanting to take a shower.

## 2021-09-27 NOTE — ED Notes (Signed)
Patient hard to redirect. Keeps coming out of the room. Have to have security to come to the zone to get her to return to her room. She is asking to clean room, informed I can't give her chemicals to clean with. Patient supplied with a soft towel and water.

## 2021-09-27 NOTE — ED Notes (Signed)
Patient currently changing her bed linens with the assistance of the sitter.

## 2021-09-28 NOTE — ED Notes (Signed)
Breakfast order placed ?

## 2021-09-28 NOTE — ED Notes (Signed)
Patient asked to go to room due to making other patient feel uncomfortable by staring in his room; pt asked for food and was offered 2nd snack bag for the night; pt slammed door and said a few foul words to staff and refused snack bag; pt states she has been waiting for dinner tray and was advised that cafeteria is now closed and she will get breakfast in the morning-Monique,RN

## 2021-09-28 NOTE — ED Notes (Signed)
Patient pushing buttons on in room and slamming doors; RN administered PO anxiety meds with night meds; Patient advised of impending transfer in the morning and need for patient not to have to get IM meds; Pt voiced understanding; Patient allowed  sitter to obtain vitals and snack provided to patient at this time-Monique,RN

## 2021-09-28 NOTE — Progress Notes (Signed)
CSW learned that pt is unable to be transport today because of unavailable Chief Operating Officer. Pt will need to be transported Tuesday 09-29-21. Charge Nurse Asher Muir updated. CSW learned information per nursing note, Celine Ahr, RN.   Maryjean Ka, MSW, LCSWA 09/28/2021 1:45 PM

## 2021-09-28 NOTE — ED Notes (Signed)
Pt walks to desk and requests coffee, this RN advised she cannot have coffee due to the time but offers water, pt declines, walks into her room and slams the door

## 2021-09-28 NOTE — ED Notes (Signed)
Pt given chapstick

## 2021-09-28 NOTE — ED Notes (Signed)
Unable to transport Pt today because of unavailable Chief Operating Officer. Pt will need to be transported Tuesday 09-29-21. Charge Nurse Asher Muir updated.

## 2021-09-28 NOTE — ED Notes (Signed)
Pt walking around in hallway near nurses station, asking for a broom and a mop and food

## 2021-09-28 NOTE — ED Notes (Signed)
Cabinets not locked, RN aware. Pt slamming cabinet doors. Cabinet lock popped off. Pt put it back in the hole. RN aware.

## 2021-09-29 ENCOUNTER — Telehealth: Payer: Self-pay

## 2021-09-29 LAB — CBG MONITORING, ED: Glucose-Capillary: 108 mg/dL — ABNORMAL HIGH (ref 70–99)

## 2021-09-29 NOTE — ED Notes (Addendum)
Pt walking around in the hall. Removed the paper off the wall sign. Pt cooperative with staff at this time. Consumed 100% of breakfast.

## 2021-09-29 NOTE — ED Notes (Signed)
Sheriff called to advised of impending transfer need to [facility] this morning; V/M left-Monique,RN

## 2021-09-29 NOTE — ED Provider Notes (Signed)
Emergency Medicine Observation Re-evaluation Note  Molly [name] is a 36 y.o. female, seen on rounds today.  Pt initially presented to the ED for complaints of Mental Health Problem Currently, the patient is being transferred to [facility].  Physical Exam  BP 115/83   Pulse 98   Temp 98.5 F (36.9 C)   Resp 18   SpO2 100%  Physical Exam General: walking around, no acute distress Cardiac: not assessed Lungs: normal effort Psych: not agitated  ED Course / MDM  EKG:EKG Interpretation  Date/Time:  Thursday Sep 24 2021 23:16:15 EDT Ventricular Rate:  76 PR Interval:  150 QRS Duration: 66 QT Interval:  382 QTC Calculation: 429 R Axis:   8 Text Interpretation: Normal sinus rhythm with sinus arrhythmia Minimal voltage criteria for LVH, may be normal variant ( R in aVL ) Cannot rule out Anterior infarct , age undetermined Abnormal ECG When compared with ECG of 22-Aug-2021 13:32, QT has shortened Left ventricular hypertrophy is now present Confirmed by Dione Booze ([id number]) on 09/25/2021 1:26:51 AM  I have reviewed the labs performed to date as well as medications administered while in observation.  No recent changes in the last 24 hours include.  Plan  Current plan is for transfer to [facility]. Molly [name] is under involuntary commitment.      Pricilla Loveless, MD 09/29/21 [phone]

## 2021-09-29 NOTE — Telephone Encounter (Signed)
RNCM received an inbound call from [facility] legal guardian questioning location of patient. This RNCM advised patient was transferred to [facility] today. No additional TOC needs at this time.

## 2021-09-29 NOTE — ED Notes (Signed)
Breakfast order placed ?

## 2021-09-29 NOTE — ED Notes (Signed)
Report given to Markus Daft RN at [facility].

## 2021-09-29 NOTE — ED Notes (Addendum)
Patient up and attempted to take a shower; pt came out of shower with  same under garments on wet; Staff attempted to get patient to change under garments and put on clean scrubs; pt started pacing unit and begin putting trash in the laundry bag; when asked not to do that patient got up set and started yelling at staff; pt turned over the bedside table and slammed papers off the desk; Security and other staff to the unit for safety; Patient agreed to take oral medication for agitation; Patient exited the unit as another staff member was coming in and was escorted back to room via [employer] and security-Monique,RN

## 2021-10-14 ENCOUNTER — Telehealth: Payer: Self-pay | Admitting: *Deleted

## 2021-10-14 NOTE — Telephone Encounter (Signed)
Guardian, [name], SW called regarding locating pt.

## 2021-10-19 ENCOUNTER — Emergency Department (HOSPITAL_COMMUNITY)
Admission: EM | Admit: 2021-10-19 | Discharge: 2021-10-20 | Disposition: A | Discharge status: Home / Self Care | Payer: 59 | Attending: Emergency Medicine | Admitting: Emergency Medicine

## 2021-10-19 ENCOUNTER — Encounter (HOSPITAL_COMMUNITY): Payer: Self-pay | Admitting: Emergency Medicine

## 2021-10-19 DIAGNOSIS — E119 Type 2 diabetes mellitus without complications: Secondary | ICD-10-CM | POA: Diagnosis not present

## 2021-10-19 DIAGNOSIS — Z79899 Other long term (current) drug therapy: Secondary | ICD-10-CM | POA: Insufficient documentation

## 2021-10-19 DIAGNOSIS — Z760 Encounter for issue of repeat prescription: Secondary | ICD-10-CM | POA: Insufficient documentation

## 2021-10-19 DIAGNOSIS — F259 Schizoaffective disorder, unspecified: Secondary | ICD-10-CM | POA: Insufficient documentation

## 2021-10-19 NOTE — ED Triage Notes (Addendum)
Pt reports that she needs a refill of her medication for diabetes and schizophrenia. States that she would like her name changed back to her legal name, but she is unsure what that name is.   Strong body odor noted. Spoke with case worker who is now aware that she is here. No concerns at this time.She requests that with discharge she is instructed to contact her ACT team ([facility]). Also asked that we not give her personal number to patient for privacy reasons.

## 2021-10-20 ENCOUNTER — Ambulatory Visit (HOSPITAL_COMMUNITY): Payer: 59 | Admitting: Licensed Clinical Social Worker

## 2021-10-20 NOTE — ED Notes (Addendum)
Pt required security/[employer] escort out of emergency department due to pt was refusing to leave exam room following discharge.

## 2021-10-20 NOTE — ED Provider Notes (Signed)
[facility]-EMERGENCY DEPT Provider Note   CSN: [id number] Arrival date & time: 10/19/21  1833     History  Chief Complaint  Patient presents with   Medication Refill    Molly [name] is a 36 y.o. female.  The history is provided by the patient and medical records.  Medication Refill  36 y.o. F with hx mediation non-compliance, schizoaffective disorder, psychoses, DM, presenting to the ED for medication refill.  She cannot tell me what medication need to be refilled, when the last time she had them, who usually prescribes them, or what pharmacy she picks them up from.  Her social worker was contacted on arrival, will help ensure she gets follow-up with ACT team.  Home Medications Prior to Admission medications   Medication Sig Start Date End Date Taking? Authorizing Provider  cephALEXin (KEFLEX) 500 MG capsule Take 500 mg by mouth 4 (four) times daily. 09/04/21   [provider]  folic acid (FOLVITE) 1 MG tablet Take 1 mg by mouth every morning. 04/29/21   [provider]  hydrOXYzine (VISTARIL) 50 MG capsule Take 100 mg by mouth every 8 (eight) hours as needed for anxiety.    [provider]  lithium carbonate (ESKALITH) 450 MG CR tablet Take 450 mg by mouth in the morning. Patient not taking: Reported on 09/23/2021 04/29/21   [provider]  lithium carbonate (LITHOBID) 300 MG CR tablet Take 600 mg by mouth at bedtime. Patient not taking: Reported on 09/23/2021 04/29/21   [provider]  nystatin (MYCOSTATIN) 100000 UNIT/ML suspension Take 400,000 mLs by mouth in the morning and at bedtime. 09/04/21   [provider]  OLANZapine (ZYPREXA) 5 MG tablet Take 5 mg by mouth at bedtime.    [provider]  paliperidone (INVEGA SUSTENNA) 156 MG/ML SUSY injection Inject 156 mg into the muscle every 30 (thirty) days.    [provider]  paliperidone (INVEGA) 6 MG 24 hr tablet Take 12 mg by mouth in  the morning. Patient not taking: Reported on 09/23/2021 04/29/21   [provider]  paliperidone (INVEGA) 9 MG 24 hr tablet Take 9 mg by mouth in the morning.    [provider]  traZODone (DESYREL) 50 MG tablet Take 50 mg by mouth at bedtime.    [provider]      Allergies    Abilify [aripiprazole]    Review of Systems   Review of Systems  Psychiatric/Behavioral:         Med refill  All other systems reviewed and are negative.   Physical Exam Updated Vital Signs BP (!) 142/86 (BP Location: Left Arm)   Pulse 96   Temp 98.2 F (36.8 C) (Oral)   Resp 16   Ht 5\' 8"  (1.727 m)   Wt 124.7 kg   SpO2 100%   BMI 41.81 kg/m   Physical Exam Vitals and nursing note reviewed.  Constitutional:      Appearance: She is well-developed.     Comments: Sleeping, awoken for exam, NAD  HENT:     Head: Normocephalic and atraumatic.  Eyes:     Conjunctiva/sclera: Conjunctivae normal.     Pupils: Pupils are equal, round, and reactive to light.  Cardiovascular:     Rate and Rhythm: Normal rate and regular rhythm.     Heart sounds: Normal heart sounds.  Pulmonary:     Effort: Pulmonary effort is normal. No respiratory distress.     Breath sounds: Normal breath  sounds. No rhonchi.  Abdominal:     General: Bowel sounds are normal.     Palpations: Abdomen is soft.     Tenderness: There is no guarding.     Hernia: No hernia is present.  Musculoskeletal:        General: Normal range of motion.     Cervical back: Normal range of motion.  Skin:    General: Skin is warm and dry.  Neurological:     Mental Status: She is alert and oriented to person, place, and time.  Psychiatric:     Comments: Does not appear to be responding to internal stimuli on exam     ED Results / Procedures / Treatments   Labs (all labs ordered are listed, but only abnormal results are displayed) Labs Reviewed - No data to display  EKG None  Radiology No results  found.  Procedures Procedures    Medications Ordered in ED Medications - No data to display  ED Course/ Medical Decision Making/ A&P                           Medical Decision Making  36 year old female presenting to the ED requesting medication refill.  Unfortunately, she is not able to tell me which medicine she needs, the last time she had them, who usually prescribes them, or the pharmacy where she fills them at.  She has no additional complaints.  She does not appear to be responding to internal stimuli.  Her social worker was notified about her presence here in the ED, no acute concerns but requested discharge and have her follow-up with her ACT team.  She will try to assist to ensure follow-up.  Patient appears stable for discharge.  Final Clinical Impression(s) / ED Diagnoses Final diagnoses:  Medication refill    Rx / DC Orders ED Discharge Orders     None         Garlon Hatchet, PA-C 10/20/21 0543    Shon Baton, MD 10/20/21 [phone]

## 2021-10-20 NOTE — Discharge Instructions (Signed)
Please follow-up with your ACT team to have your medications refilled.

## 2021-10-20 NOTE — ED Notes (Signed)
Pt refused d/c vital signs 

## 2021-10-20 NOTE — ED Notes (Signed)
Pt instructed to follow up with [facility] as instructed by her case worker/ legal guardian.

## 2021-10-23 ENCOUNTER — Encounter (HOSPITAL_COMMUNITY): Payer: Self-pay

## 2021-10-23 ENCOUNTER — Other Ambulatory Visit: Payer: Self-pay

## 2021-10-23 ENCOUNTER — Emergency Department (HOSPITAL_COMMUNITY)
Admission: EM | Admit: 2021-10-23 | Discharge: 2021-10-23 | Disposition: A | Discharge status: Home / Self Care | Payer: 59 | Source: Home / Self Care | Attending: Emergency Medicine | Admitting: Emergency Medicine

## 2021-10-23 DIAGNOSIS — R4182 Altered mental status, unspecified: Secondary | ICD-10-CM | POA: Insufficient documentation

## 2021-10-23 DIAGNOSIS — S82221A Displaced transverse fracture of shaft of right tibia, initial encounter for closed fracture: Secondary | ICD-10-CM | POA: Diagnosis not present

## 2021-10-23 MED ORDER — PALIPERIDONE PALMITATE ER 156 MG/ML IM SUSY: 156.0000 mg | PREFILLED_SYRINGE | Freq: Once | INTRAMUSCULAR | Status: DC

## 2021-10-23 NOTE — ED Triage Notes (Signed)
Patient brought in via ems from in convenience store. Per ems, patient once stated that she was out of her meds. Recently d/c from [facility]. Patient non verbal and not participating in triage

## 2021-10-25 ENCOUNTER — Emergency Department (HOSPITAL_COMMUNITY): Payer: 59

## 2021-10-25 ENCOUNTER — Inpatient Hospital Stay (HOSPITAL_COMMUNITY)
Admission: EM | Admit: 2021-10-25 | Discharge: 2021-12-03 | DRG: 493 | Disposition: A | Discharge status: Other Acute Inpatient Hospital | Payer: 59 | Attending: Internal Medicine | Admitting: Internal Medicine

## 2021-10-25 DIAGNOSIS — M25361 Other instability, right knee: Secondary | ICD-10-CM | POA: Diagnosis present

## 2021-10-25 DIAGNOSIS — G2401 Drug induced subacute dyskinesia: Secondary | ICD-10-CM | POA: Diagnosis present

## 2021-10-25 DIAGNOSIS — K5903 Drug induced constipation: Secondary | ICD-10-CM | POA: Diagnosis not present

## 2021-10-25 DIAGNOSIS — S82201A Unspecified fracture of shaft of right tibia, initial encounter for closed fracture: Secondary | ICD-10-CM | POA: Diagnosis not present

## 2021-10-25 DIAGNOSIS — F200 Paranoid schizophrenia: Secondary | ICD-10-CM | POA: Diagnosis present

## 2021-10-25 DIAGNOSIS — F201 Disorganized schizophrenia: Secondary | ICD-10-CM | POA: Diagnosis present

## 2021-10-25 DIAGNOSIS — D649 Anemia, unspecified: Secondary | ICD-10-CM

## 2021-10-25 DIAGNOSIS — N3944 Nocturnal enuresis: Secondary | ICD-10-CM | POA: Diagnosis present

## 2021-10-25 DIAGNOSIS — Z23 Encounter for immunization: Secondary | ICD-10-CM

## 2021-10-25 DIAGNOSIS — M545 Low back pain, unspecified: Secondary | ICD-10-CM | POA: Diagnosis present

## 2021-10-25 DIAGNOSIS — N3281 Overactive bladder: Secondary | ICD-10-CM | POA: Diagnosis present

## 2021-10-25 DIAGNOSIS — S82401A Unspecified fracture of shaft of right fibula, initial encounter for closed fracture: Secondary | ICD-10-CM | POA: Diagnosis not present

## 2021-10-25 DIAGNOSIS — J45909 Unspecified asthma, uncomplicated: Secondary | ICD-10-CM | POA: Diagnosis present

## 2021-10-25 DIAGNOSIS — Z5320 Procedure and treatment not carried out because of patient's decision for unspecified reasons: Secondary | ICD-10-CM | POA: Diagnosis not present

## 2021-10-25 DIAGNOSIS — F988 Other specified behavioral and emotional disorders with onset usually occurring in childhood and adolescence: Secondary | ICD-10-CM | POA: Diagnosis present

## 2021-10-25 DIAGNOSIS — Z818 Family history of other mental and behavioral disorders: Secondary | ICD-10-CM

## 2021-10-25 DIAGNOSIS — R4182 Altered mental status, unspecified: Secondary | ICD-10-CM

## 2021-10-25 DIAGNOSIS — Z833 Family history of diabetes mellitus: Secondary | ICD-10-CM

## 2021-10-25 DIAGNOSIS — S82431A Displaced oblique fracture of shaft of right fibula, initial encounter for closed fracture: Secondary | ICD-10-CM | POA: Diagnosis present

## 2021-10-25 DIAGNOSIS — E119 Type 2 diabetes mellitus without complications: Secondary | ICD-10-CM | POA: Diagnosis present

## 2021-10-25 DIAGNOSIS — S82221A Displaced transverse fracture of shaft of right tibia, initial encounter for closed fracture: Secondary | ICD-10-CM | POA: Diagnosis present

## 2021-10-25 DIAGNOSIS — T424X5A Adverse effect of benzodiazepines, initial encounter: Secondary | ICD-10-CM | POA: Diagnosis not present

## 2021-10-25 DIAGNOSIS — F259 Schizoaffective disorder, unspecified: Secondary | ICD-10-CM | POA: Diagnosis not present

## 2021-10-25 DIAGNOSIS — K219 Gastro-esophageal reflux disease without esophagitis: Secondary | ICD-10-CM | POA: Diagnosis present

## 2021-10-25 DIAGNOSIS — Z6841 Body Mass Index (BMI) 40.0 and over, adult: Secondary | ICD-10-CM | POA: Diagnosis not present

## 2021-10-25 DIAGNOSIS — Z811 Family history of alcohol abuse and dependence: Secondary | ICD-10-CM

## 2021-10-25 DIAGNOSIS — E8889 Other specified metabolic disorders: Secondary | ICD-10-CM | POA: Diagnosis present

## 2021-10-25 DIAGNOSIS — F23 Brief psychotic disorder: Secondary | ICD-10-CM | POA: Diagnosis not present

## 2021-10-25 DIAGNOSIS — D62 Acute posthemorrhagic anemia: Secondary | ICD-10-CM | POA: Diagnosis not present

## 2021-10-25 DIAGNOSIS — E559 Vitamin D deficiency, unspecified: Secondary | ICD-10-CM | POA: Diagnosis present

## 2021-10-25 DIAGNOSIS — I10 Essential (primary) hypertension: Secondary | ICD-10-CM | POA: Diagnosis present

## 2021-10-25 DIAGNOSIS — Z5900 Homelessness unspecified: Secondary | ICD-10-CM

## 2021-10-25 DIAGNOSIS — Y92410 Unspecified street and highway as the place of occurrence of the external cause: Secondary | ICD-10-CM | POA: Diagnosis not present

## 2021-10-25 DIAGNOSIS — Z79899 Other long term (current) drug therapy: Secondary | ICD-10-CM

## 2021-10-25 DIAGNOSIS — Z91148 Patient's other noncompliance with medication regimen for other reason: Secondary | ICD-10-CM

## 2021-10-25 DIAGNOSIS — Z888 Allergy status to other drugs, medicaments and biological substances status: Secondary | ICD-10-CM

## 2021-10-25 DIAGNOSIS — F17210 Nicotine dependence, cigarettes, uncomplicated: Secondary | ICD-10-CM | POA: Diagnosis present

## 2021-10-25 DIAGNOSIS — D75839 Thrombocytosis, unspecified: Secondary | ICD-10-CM | POA: Diagnosis present

## 2021-10-25 DIAGNOSIS — G8929 Other chronic pain: Secondary | ICD-10-CM | POA: Diagnosis present

## 2021-10-25 LAB — COMPREHENSIVE METABOLIC PANEL
ALT: 23 U/L (ref 0–44)
AST: 55 U/L — ABNORMAL HIGH (ref 15–41)
Albumin: 3.8 g/dL (ref 3.5–5.0)
Alkaline Phosphatase: 96 U/L (ref 38–126)
Anion gap: 13 (ref 5–15)
BUN: 19 mg/dL (ref 6–20)
CO2: 19 mmol/L — ABNORMAL LOW (ref 22–32)
Calcium: 9.2 mg/dL (ref 8.9–10.3)
Chloride: 107 mmol/L (ref 98–111)
Creatinine, Ser: 0.92 mg/dL (ref 0.44–1.00)
GFR, Estimated: >60 mL/min (ref >60)
Glucose, Bld: 122 mg/dL — ABNORMAL HIGH (ref 70–99)
Potassium: 3.8 mmol/L (ref 3.5–5.1)
Sodium: 139 mmol/L (ref 135–145)
Total Bilirubin: 0.9 mg/dL (ref 0.3–1.2)
Total Protein: 7 g/dL (ref 6.5–8.1)

## 2021-10-25 LAB — CBC
HCT: 35.4 % — ABNORMAL LOW (ref 36.0–46.0)
Hemoglobin: 11.3 g/dL — ABNORMAL LOW (ref 12.0–15.0)
MCH: 27.5 pg (ref 26.0–34.0)
MCHC: 31.9 g/dL (ref 30.0–36.0)
MCV: 86.1 fL (ref 80.0–100.0)
Platelets: 406 10*3/uL — ABNORMAL HIGH (ref 150–400)
RBC: 4.11 MIL/uL (ref 3.87–5.11)
RDW: 15.9 % — ABNORMAL HIGH (ref 11.5–15.5)
WBC: 14 10*3/uL — ABNORMAL HIGH (ref 4.0–10.5)
nRBC: 0 % (ref 0.0–0.2)

## 2021-10-25 LAB — RAPID URINE DRUG SCREEN, HOSP PERFORMED
Amphetamines: NOT DETECTED
Barbiturates: NOT DETECTED
Benzodiazepines: NOT DETECTED
Cocaine: NOT DETECTED
Opiates: POSITIVE — AB
Tetrahydrocannabinol: NOT DETECTED

## 2021-10-25 LAB — PROTIME-INR
INR: 1.1 (ref 0.8–1.2)
Prothrombin Time: 14.5 seconds (ref 11.4–15.2)

## 2021-10-25 LAB — I-STAT BETA HCG BLOOD, ED (MC, WL, AP ONLY): I-stat hCG, quantitative: <5 m[IU]/mL (ref <5)

## 2021-10-25 LAB — HIV ANTIBODY (ROUTINE TESTING W REFLEX): HIV Screen 4th Generation wRfx: NONREACTIVE

## 2021-10-25 LAB — SAMPLE TO BLOOD BANK

## 2021-10-25 LAB — ETHANOL: Alcohol, Ethyl (B): <10 mg/dL (ref <10)

## 2021-10-25 MED ORDER — IOHEXOL 300 MG/ML  SOLN
100.0000 mL | Freq: Once | INTRAMUSCULAR | Status: AC | PRN
  Administered 2021-10-25: 100 mL via INTRAVENOUS

## 2021-10-25 MED ORDER — ACETAMINOPHEN 650 MG RE SUPP
650.0000 mg | Freq: Three times a day (TID) | RECTAL | Status: DC
  Filled 2021-10-25 (×5): qty 1

## 2021-10-25 MED ORDER — OXYCODONE HCL 5 MG PO TABS
5.0000 mg | ORAL_TABLET | Freq: Four times a day (QID) | ORAL | Status: DC
  Administered 2021-10-25 – 2021-10-26 (×3): 5 mg via ORAL
  Filled 2021-10-25 (×3): qty 1

## 2021-10-25 MED ORDER — ACETAMINOPHEN 500 MG PO TABS
1000.0000 mg | ORAL_TABLET | Freq: Three times a day (TID) | ORAL | Status: DC
  Administered 2021-10-25 – 2021-12-03 (×89): 1000 mg via ORAL
  Filled 2021-10-25 (×95): qty 2

## 2021-10-25 MED ORDER — HEPARIN SODIUM (PORCINE) 5000 UNIT/ML IJ SOLN
5000.0000 [IU] | Freq: Three times a day (TID) | INTRAMUSCULAR | Status: DC
  Administered 2021-10-25 (×2): 5000 [IU] via SUBCUTANEOUS
  Filled 2021-10-25 (×3): qty 1

## 2021-10-25 MED ORDER — HYDROCODONE-ACETAMINOPHEN 5-325 MG PO TABS: 1.0000 | ORAL_TABLET | ORAL | Status: DC | PRN

## 2021-10-25 MED ORDER — POLYETHYLENE GLYCOL 3350 17 G PO PACK
17.0000 g | PACK | Freq: Every day | ORAL | Status: DC | PRN
  Administered 2021-11-04 – 2021-11-05 (×2): 17 g via ORAL
  Filled 2021-10-25 (×2): qty 1

## 2021-10-25 MED ORDER — ACETAMINOPHEN 650 MG RE SUPP: 650.0000 mg | Freq: Four times a day (QID) | RECTAL | Status: DC | PRN

## 2021-10-25 MED ORDER — ACETAMINOPHEN 325 MG PO TABS: 650.0000 mg | ORAL_TABLET | Freq: Four times a day (QID) | ORAL | Status: DC | PRN

## 2021-10-25 MED ORDER — TETANUS-DIPHTH-ACELL PERTUSSIS 5-2.5-18.5 LF-MCG/0.5 IM SUSY
0.5000 mL | PREFILLED_SYRINGE | Freq: Once | INTRAMUSCULAR | Status: AC
Start: 2021-10-25 — End: 2021-10-25
  Administered 2021-10-25: 0.5 mL via INTRAMUSCULAR
  Filled 2021-10-25: qty 0.5

## 2021-10-25 MED ORDER — ENOXAPARIN SODIUM 60 MG/0.6ML IJ SOSY
60.0000 mg | PREFILLED_SYRINGE | INTRAMUSCULAR | Status: DC
  Filled 2021-10-25: qty 0.6

## 2021-10-25 MED ORDER — MORPHINE SULFATE (PF) 4 MG/ML IV SOLN
4.0000 mg | Freq: Once | INTRAVENOUS | Status: AC
  Administered 2021-10-25: 4 mg via INTRAVENOUS
  Filled 2021-10-25: qty 1

## 2021-10-25 MED ORDER — HYDROMORPHONE HCL 1 MG/ML IJ SOLN
0.5000 mg | INTRAMUSCULAR | Status: DC | PRN
  Administered 2021-10-26 – 2021-10-28 (×9): 1 mg via INTRAVENOUS
  Filled 2021-10-25 (×10): qty 1

## 2021-10-25 NOTE — ED Notes (Signed)
Pt still in EMS splint with pulses present. Pt found to be covered in urine, bed changed and purwick placed on pt.

## 2021-10-25 NOTE — ED Notes (Signed)
Pt seems to be actively hallucinating, gesturing to objects that are not there and speaking animatedly in nonsensical phrases with no obvious external stimuli. She is currently compliant and does not seem aggressive. Pt took her medications without difficulty and laid back down on the stretcher to rest.

## 2021-10-25 NOTE — ED Notes (Signed)
Pt refused heparin injection. RN provided education regarding the risk of developing a blood clot but still declined. Pt has dried blood on her face and neck; RN offered a wet washcloth to help her clean the blood off her face but pt refused this as well.
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
## 2021-10-25 NOTE — Progress Notes (Addendum)
Pt. Arrived to unit, refused to answer questions, but will make contact. Pt. Refuse to be moved from stretcher to bed. Doc notified

## 2021-10-25 NOTE — Progress Notes (Addendum)
Orthopedic Tech Progress Note Patient Details:  Molly [name] [birth year] [id number]  Webril, ace wrap, and knee immobilizer applied in best obtainable fashion to RLE with assistance from [employer], [place]. Emphasized importance to pt about wearing the brace at all times to keep her leg straight as we want to avoid any bending and to avoid bearing weight.   Ortho Devices Type of Ortho Device: Knee Immobilizer, Cotton web roll, Ace wrap Ortho Device/Splint Location: RLE Ortho Device/Splint Interventions: Ordered, Application   Post Interventions Patient Tolerated: Poor Instructions Provided: Adjustment of device, Care of device  Emmanuel Ercole Carmine Savoy 10/25/2021, 9:19 AM

## 2021-10-26 ENCOUNTER — Other Ambulatory Visit: Payer: Self-pay

## 2021-10-26 ENCOUNTER — Inpatient Hospital Stay (HOSPITAL_COMMUNITY): Payer: 59

## 2021-10-26 ENCOUNTER — Inpatient Hospital Stay (HOSPITAL_COMMUNITY): Payer: 59 | Admitting: Anesthesiology

## 2021-10-26 ENCOUNTER — Encounter (HOSPITAL_COMMUNITY)
Admission: EM | Disposition: A | Discharge status: Other Acute Inpatient Hospital | Payer: Self-pay | Source: Home / Self Care | Attending: Internal Medicine

## 2021-10-26 DIAGNOSIS — S82401A Unspecified fracture of shaft of right fibula, initial encounter for closed fracture: Secondary | ICD-10-CM

## 2021-10-26 DIAGNOSIS — M25361 Other instability, right knee: Secondary | ICD-10-CM

## 2021-10-26 DIAGNOSIS — S82201A Unspecified fracture of shaft of right tibia, initial encounter for closed fracture: Secondary | ICD-10-CM

## 2021-10-26 DIAGNOSIS — F201 Disorganized schizophrenia: Secondary | ICD-10-CM | POA: Diagnosis not present

## 2021-10-26 DIAGNOSIS — F23 Brief psychotic disorder: Secondary | ICD-10-CM

## 2021-10-26 DIAGNOSIS — I10 Essential (primary) hypertension: Secondary | ICD-10-CM | POA: Diagnosis not present

## 2021-10-26 HISTORY — PX: TIBIA IM NAIL INSERTION: SHX2516

## 2021-10-26 LAB — BASIC METABOLIC PANEL
Anion gap: 11 (ref 5–15)
BUN: 6 mg/dL (ref 6–20)
CO2: 20 mmol/L — ABNORMAL LOW (ref 22–32)
Calcium: 8.5 mg/dL — ABNORMAL LOW (ref 8.9–10.3)
Chloride: 106 mmol/L (ref 98–111)
Creatinine, Ser: 0.72 mg/dL (ref 0.44–1.00)
GFR, Estimated: >60 mL/min (ref >60)
Glucose, Bld: 116 mg/dL — ABNORMAL HIGH (ref 70–99)
Potassium: 3.4 mmol/L — ABNORMAL LOW (ref 3.5–5.1)
Sodium: 137 mmol/L (ref 135–145)

## 2021-10-26 LAB — CBC
HCT: 32.2 % — ABNORMAL LOW (ref 36.0–46.0)
Hemoglobin: 10.6 g/dL — ABNORMAL LOW (ref 12.0–15.0)
MCH: 27.8 pg (ref 26.0–34.0)
MCHC: 32.9 g/dL (ref 30.0–36.0)
MCV: 84.5 fL (ref 80.0–100.0)
Platelets: 347 10*3/uL (ref 150–400)
RBC: 3.81 MIL/uL — ABNORMAL LOW (ref 3.87–5.11)
RDW: 15.8 % — ABNORMAL HIGH (ref 11.5–15.5)
WBC: 8.8 10*3/uL (ref 4.0–10.5)
nRBC: 0 % (ref 0.0–0.2)

## 2021-10-26 SURGERY — INSERTION, INTRAMEDULLARY ROD, TIBIA
Anesthesia: General | Laterality: Right

## 2021-10-26 MED ORDER — LABETALOL HCL 5 MG/ML IV SOLN: 5.0000 mg | INTRAVENOUS | Status: DC | PRN

## 2021-10-26 MED ORDER — ONDANSETRON HCL 4 MG/2ML IJ SOLN
INTRAMUSCULAR | Status: AC
  Filled 2021-10-26: qty 2

## 2021-10-26 MED ORDER — LORAZEPAM 2 MG/ML IJ SOLN
0.5000 mg | Freq: Once | INTRAMUSCULAR | Status: AC | PRN
  Administered 2021-10-26: 0.5 mg via INTRAVENOUS
  Filled 2021-10-26: qty 1

## 2021-10-26 MED ORDER — MIDAZOLAM HCL 2 MG/2ML IJ SOLN
INTRAMUSCULAR | Status: DC | PRN
  Administered 2021-10-26 (×2): 1 mg via INTRAVENOUS

## 2021-10-26 MED ORDER — LIDOCAINE 2% (20 MG/ML) 5 ML SYRINGE
INTRAMUSCULAR | Status: DC | PRN
  Administered 2021-10-26: 100 mg via INTRAVENOUS

## 2021-10-26 MED ORDER — CEFAZOLIN SODIUM-DEXTROSE 2-4 GM/100ML-% IV SOLN
INTRAVENOUS | Status: AC
  Filled 2021-10-26: qty 100

## 2021-10-26 MED ORDER — ASCORBIC ACID 500 MG PO TABS
500.0000 mg | ORAL_TABLET | Freq: Every day | ORAL | Status: DC
  Administered 2021-10-27 – 2021-12-03 (×38): 500 mg via ORAL
  Filled 2021-10-26 (×38): qty 1

## 2021-10-26 MED ORDER — OLANZAPINE 5 MG PO TABS
5.0000 mg | ORAL_TABLET | Freq: Every day | ORAL | Status: AC
  Administered 2021-10-27: 5 mg via ORAL
  Filled 2021-10-26: qty 1

## 2021-10-26 MED ORDER — OLANZAPINE 10 MG PO TABS
10.0000 mg | ORAL_TABLET | Freq: Two times a day (BID) | ORAL | Status: DC | PRN
  Administered 2021-10-28 – 2021-10-29 (×3): 10 mg via ORAL
  Filled 2021-10-26 (×4): qty 1

## 2021-10-26 MED ORDER — LABETALOL HCL 5 MG/ML IV SOLN
5.0000 mg | INTRAVENOUS | Status: AC | PRN
  Administered 2021-10-26 (×4): 5 mg via INTRAVENOUS

## 2021-10-26 MED ORDER — PROPOFOL 10 MG/ML IV BOLUS
INTRAVENOUS | Status: DC | PRN
  Administered 2021-10-26: 200 mg via INTRAVENOUS

## 2021-10-26 MED ORDER — OLANZAPINE 10 MG IM SOLR: 10.0000 mg | Freq: Once | INTRAMUSCULAR | Status: DC | PRN

## 2021-10-26 MED ORDER — HYDROMORPHONE HCL 1 MG/ML IJ SOLN
INTRAMUSCULAR | Status: AC
  Filled 2021-10-26: qty 0.5

## 2021-10-26 MED ORDER — LABETALOL HCL 5 MG/ML IV SOLN
INTRAVENOUS | Status: AC
  Filled 2021-10-26: qty 4

## 2021-10-26 MED ORDER — PHENYLEPHRINE 80 MCG/ML (10ML) SYRINGE FOR IV PUSH (FOR BLOOD PRESSURE SUPPORT)
PREFILLED_SYRINGE | INTRAVENOUS | Status: DC | PRN
  Administered 2021-10-26 (×2): 80 ug via INTRAVENOUS

## 2021-10-26 MED ORDER — ROCURONIUM BROMIDE 10 MG/ML (PF) SYRINGE
PREFILLED_SYRINGE | INTRAVENOUS | Status: DC | PRN
  Administered 2021-10-26: 80 mg via INTRAVENOUS
  Administered 2021-10-26: 20 mg via INTRAVENOUS

## 2021-10-26 MED ORDER — HYDROMORPHONE HCL 1 MG/ML IJ SOLN: 0.2500 mg | INTRAMUSCULAR | Status: DC | PRN

## 2021-10-26 MED ORDER — ORAL CARE MOUTH RINSE: 15.0000 mL | Freq: Once | OROMUCOSAL | Status: DC

## 2021-10-26 MED ORDER — DEXMEDETOMIDINE (PRECEDEX) IN NS 20 MCG/5ML (4 MCG/ML) IV SYRINGE
PREFILLED_SYRINGE | INTRAVENOUS | Status: DC | PRN
  Administered 2021-10-26: 4 ug via INTRAVENOUS
  Administered 2021-10-26 (×3): 8 ug via INTRAVENOUS

## 2021-10-26 MED ORDER — CHLORHEXIDINE GLUCONATE 0.12 % MT SOLN: 15.0000 mL | Freq: Once | OROMUCOSAL | Status: DC

## 2021-10-26 MED ORDER — OLANZAPINE 5 MG PO TABS
5.0000 mg | ORAL_TABLET | Freq: Every day | ORAL | Status: DC
Start: 2021-10-26 — End: 2021-10-27
  Filled 2021-10-26: qty 1

## 2021-10-26 MED ORDER — OXYCODONE HCL 5 MG PO TABS
10.0000 mg | ORAL_TABLET | Freq: Four times a day (QID) | ORAL | Status: DC
  Administered 2021-10-27 – 2021-10-29 (×7): 10 mg via ORAL
  Filled 2021-10-26 (×11): qty 2

## 2021-10-26 MED ORDER — POTASSIUM CHLORIDE CRYS ER 20 MEQ PO TBCR
20.0000 meq | EXTENDED_RELEASE_TABLET | Freq: Once | ORAL | Status: DC
Start: 2021-10-26 — End: 2021-11-26
  Filled 2021-10-26: qty 1

## 2021-10-26 MED ORDER — CEFAZOLIN SODIUM-DEXTROSE 2-3 GM-%(50ML) IV SOLR
INTRAVENOUS | Status: DC | PRN
  Administered 2021-10-26: 2 g via INTRAVENOUS

## 2021-10-26 MED ORDER — CHLORHEXIDINE GLUCONATE 0.12 % MT SOLN
OROMUCOSAL | Status: AC
  Filled 2021-10-26: qty 15

## 2021-10-26 MED ORDER — OLANZAPINE 10 MG IM SOLR: 10.0000 mg | Freq: Two times a day (BID) | INTRAMUSCULAR | Status: DC | PRN

## 2021-10-26 MED ORDER — MIDAZOLAM HCL 2 MG/2ML IJ SOLN
INTRAMUSCULAR | Status: AC
  Filled 2021-10-26: qty 2

## 2021-10-26 MED ORDER — SUGAMMADEX SODIUM 200 MG/2ML IV SOLN
INTRAVENOUS | Status: DC | PRN
  Administered 2021-10-26: 200 mg via INTRAVENOUS

## 2021-10-26 MED ORDER — PROPOFOL 10 MG/ML IV BOLUS
INTRAVENOUS | Status: AC
  Filled 2021-10-26: qty 20

## 2021-10-26 MED ORDER — DEXAMETHASONE SODIUM PHOSPHATE 10 MG/ML IJ SOLN
INTRAMUSCULAR | Status: DC | PRN
  Administered 2021-10-26 (×2): 8 mg via INTRAVENOUS

## 2021-10-26 MED ORDER — ENOXAPARIN SODIUM 40 MG/0.4ML IJ SOSY: 40.0000 mg | PREFILLED_SYRINGE | INTRAMUSCULAR | Status: DC

## 2021-10-26 MED ORDER — LIDOCAINE 2% (20 MG/ML) 5 ML SYRINGE
INTRAMUSCULAR | Status: AC
  Filled 2021-10-26: qty 5

## 2021-10-26 MED ORDER — ROCURONIUM BROMIDE 10 MG/ML (PF) SYRINGE
PREFILLED_SYRINGE | INTRAVENOUS | Status: AC
Start: 2021-10-26 — End: ?
  Filled 2021-10-26: qty 10

## 2021-10-26 MED ORDER — PHENYLEPHRINE 80 MCG/ML (10ML) SYRINGE FOR IV PUSH (FOR BLOOD PRESSURE SUPPORT)
PREFILLED_SYRINGE | INTRAVENOUS | Status: AC
Start: 2021-10-26 — End: ?
  Filled 2021-10-26: qty 10

## 2021-10-26 MED ORDER — 0.9 % SODIUM CHLORIDE (POUR BTL) OPTIME
TOPICAL | Status: DC | PRN
  Administered 2021-10-26: 1000 mL

## 2021-10-26 MED ORDER — FENTANYL CITRATE (PF) 250 MCG/5ML IJ SOLN
INTRAMUSCULAR | Status: AC
  Filled 2021-10-26: qty 5

## 2021-10-26 MED ORDER — CEFAZOLIN SODIUM-DEXTROSE 2-4 GM/100ML-% IV SOLN
2.0000 g | Freq: Three times a day (TID) | INTRAVENOUS | Status: AC
  Administered 2021-10-26 – 2021-10-27 (×3): 2 g via INTRAVENOUS
  Filled 2021-10-26 (×3): qty 100

## 2021-10-26 MED ORDER — DEXAMETHASONE SODIUM PHOSPHATE 10 MG/ML IJ SOLN
INTRAMUSCULAR | Status: AC
  Filled 2021-10-26: qty 1

## 2021-10-26 MED ORDER — ALBUMIN HUMAN 5 % IV SOLN: INTRAVENOUS | Status: DC | PRN

## 2021-10-26 MED ORDER — DEXMEDETOMIDINE HCL IN NACL 80 MCG/20ML IV SOLN
INTRAVENOUS | Status: AC
  Filled 2021-10-26: qty 20

## 2021-10-26 MED ORDER — FENTANYL CITRATE (PF) 250 MCG/5ML IJ SOLN
INTRAMUSCULAR | Status: DC | PRN
Start: 2021-10-26 — End: 2021-10-26
  Administered 2021-10-26: 50 ug via INTRAVENOUS
  Administered 2021-10-26: 100 ug via INTRAVENOUS
  Administered 2021-10-26: 50 ug via INTRAVENOUS
  Administered 2021-10-26: 150 ug via INTRAVENOUS

## 2021-10-26 MED ORDER — LACTATED RINGERS IV SOLN
INTRAVENOUS | Status: DC
Start: 2021-10-26 — End: 2021-10-26

## 2021-10-26 MED ORDER — ONDANSETRON HCL 4 MG/2ML IJ SOLN
INTRAMUSCULAR | Status: DC | PRN
  Administered 2021-10-26: 4 mg via INTRAVENOUS

## 2021-10-26 MED ORDER — HEPARIN SODIUM (PORCINE) 5000 UNIT/ML IJ SOLN: 5000.0000 [IU] | Freq: Three times a day (TID) | INTRAMUSCULAR | Status: DC

## 2021-10-26 MED ORDER — HYDROMORPHONE HCL 1 MG/ML IJ SOLN
INTRAMUSCULAR | Status: DC | PRN
  Administered 2021-10-26: .5 mg via INTRAVENOUS

## 2021-10-26 SURGICAL SUPPLY — 80 items
BAG COUNTER SPONGE SURGICOUNT (BAG) ×3 IMPLANT
BAG SPNG CNTER NS LX DISP (BAG) ×1
BIT DRILL FLEXIBLE LONG 12 (BIT) ×1 IMPLANT
BIT DRILL LONG 4.2 (BIT) ×1 IMPLANT
BIT DRILL QC 2.5X135 (BIT) ×1 IMPLANT
BIT DRILL SHORT 4.2 (BIT) IMPLANT
BLADE SURG 10 STRL SS (BLADE) ×3 IMPLANT
BNDG CMPR MED 10X6 ELC LF (GAUZE/BANDAGES/DRESSINGS) ×1
BNDG ELASTIC 4X5.8 VLCR STR LF (GAUZE/BANDAGES/DRESSINGS) ×3 IMPLANT
BNDG ELASTIC 6X10 VLCR STRL LF (GAUZE/BANDAGES/DRESSINGS) ×1 IMPLANT
BNDG ELASTIC 6X5.8 VLCR STR LF (GAUZE/BANDAGES/DRESSINGS) ×3 IMPLANT
BRUSH SCRUB EZ PLAIN DRY (MISCELLANEOUS) ×6 IMPLANT
COVER SURGICAL LIGHT HANDLE (MISCELLANEOUS) ×5 IMPLANT
DRAPE C-ARM 42X72 X-RAY (DRAPES) ×3 IMPLANT
DRAPE C-ARMOR (DRAPES) ×3 IMPLANT
DRAPE HALF SHEET 40X57 (DRAPES) IMPLANT
DRAPE IMP U-DRAPE 54X76 (DRAPES) ×1 IMPLANT
DRAPE INCISE IOBAN 66X45 STRL (DRAPES) IMPLANT
DRAPE U-SHAPE 47X51 STRL (DRAPES) ×3 IMPLANT
DRESSING MEPILEX FLEX 4X4 (GAUZE/BANDAGES/DRESSINGS) IMPLANT
DRILL BIT SHORT 4.2 (BIT) ×2
DRSG ADAPTIC 3X8 NADH LF (GAUZE/BANDAGES/DRESSINGS) ×3 IMPLANT
DRSG MEPILEX BORDER 4X8 (GAUZE/BANDAGES/DRESSINGS) ×1 IMPLANT
DRSG MEPILEX FLEX 4X4 (GAUZE/BANDAGES/DRESSINGS) ×2
DRSG MEPITEL 4X7.2 (GAUZE/BANDAGES/DRESSINGS) ×3 IMPLANT
DRSG PAD ABDOMINAL 8X10 ST (GAUZE/BANDAGES/DRESSINGS) ×6 IMPLANT
ELECT REM PT RETURN 9FT ADLT (ELECTROSURGICAL) ×2
ELECTRODE REM PT RTRN 9FT ADLT (ELECTROSURGICAL) ×2 IMPLANT
GAUZE SPONGE 4X4 12PLY STRL (GAUZE/BANDAGES/DRESSINGS) ×3 IMPLANT
GLOVE BIO SURGEON STRL SZ7.5 (GLOVE) ×3 IMPLANT
GLOVE BIO SURGEON STRL SZ8 (GLOVE) ×3 IMPLANT
GLOVE BIO SURGEON STRL SZ8.5 (GLOVE) ×3 IMPLANT
GLOVE BIOGEL PI IND STRL 7.5 (GLOVE) ×2 IMPLANT
GLOVE BIOGEL PI IND STRL 8 (GLOVE) ×2 IMPLANT
GLOVE BIOGEL PI INDICATOR 7.5 (GLOVE) ×1
GLOVE BIOGEL PI INDICATOR 8 (GLOVE) ×1
GLOVE SURG ORTHO LTX SZ7.5 (GLOVE) ×6 IMPLANT
GOWN STRL REUS W/ TWL LRG LVL3 (GOWN DISPOSABLE) ×4 IMPLANT
GOWN STRL REUS W/ TWL XL LVL3 (GOWN DISPOSABLE) ×2 IMPLANT
GOWN STRL REUS W/TWL LRG LVL3 (GOWN DISPOSABLE) ×4
GOWN STRL REUS W/TWL XL LVL3 (GOWN DISPOSABLE) ×2
GUIDEWIRE 3.2X400 (WIRE) ×2 IMPLANT
IMMOBILIZER KNEE 22 UNIV (SOFTGOODS) ×1 IMPLANT
KIT BASIN OR (CUSTOM PROCEDURE TRAY) ×3 IMPLANT
KIT TURNOVER KIT B (KITS) ×3 IMPLANT
NAIL TIB TFNA 9X375 (Nail) ×1 IMPLANT
PACK ORTHO EXTREMITY (CUSTOM PROCEDURE TRAY) ×3 IMPLANT
PAD ABD 8X10 STRL (GAUZE/BANDAGES/DRESSINGS) ×1 IMPLANT
PAD ARMBOARD 7.5X6 YLW CONV (MISCELLANEOUS) ×6 IMPLANT
PAD CAST 3X4 CTTN HI CHSV (CAST SUPPLIES) IMPLANT
PAD CAST 4YDX4 CTTN HI CHSV (CAST SUPPLIES) ×2 IMPLANT
PADDING CAST COTTON 3X4 STRL (CAST SUPPLIES) ×2
PADDING CAST COTTON 4X4 STRL (CAST SUPPLIES) ×2
PADDING CAST COTTON 6X4 STRL (CAST SUPPLIES) ×3 IMPLANT
REAMER ROD 3.8 BALL TIP 3X950 (ORTHOPEDIC DISPOSABLE SUPPLIES) ×1 IMPLANT
SCREW LOCK 60X5X IM NL (Screw) IMPLANT
SCREW LOCK CORT ST 3.5X38 (Screw) ×1 IMPLANT
SCREW LOCK IM 46X5X TI NIOBIUM (Screw) IMPLANT
SCREW LOCK IM NAIL 5.0X34 (Screw) ×1 IMPLANT
SCREW LOCK IM NAIL 5X28 (Screw) ×1 IMPLANT
SCREW LOCK IM NAIL 5X46 (Screw) ×2 IMPLANT
SCREW LOCK IM NAIL 5X56 (Screw) ×1 IMPLANT
SCREW LOCK IM NAIL 5X60 (Screw) ×2 IMPLANT
SCREW LOCK IM NAIL 5X62 (Screw) ×1 IMPLANT
SCREW LOCK IM NAIL 5X70 (Screw) ×1 IMPLANT
SCREW LOCK IM NL 5X64 (Screw) ×1 IMPLANT
SCREW LOCK IM TI 5X30 (Screw) ×1 IMPLANT
SPONGE DRAIN TRACH 4X4 STRL 2S (GAUZE/BANDAGES/DRESSINGS) ×1 IMPLANT
STAPLER VISISTAT 35W (STAPLE) ×3 IMPLANT
SUT ETHILON 2 0 FS 18 (SUTURE) ×5 IMPLANT
SUT ETHILON 3 0 FSLX (SUTURE) ×1 IMPLANT
SUT VIC AB 0 CT1 27 (SUTURE)
SUT VIC AB 0 CT1 27XBRD ANBCTR (SUTURE) IMPLANT
SUT VIC AB 1 CT1 36 (SUTURE) ×1 IMPLANT
SUT VIC AB 2-0 CT1 27 (SUTURE) ×2
SUT VIC AB 2-0 CT1 TAPERPNT 27 (SUTURE) ×2 IMPLANT
TOWEL GREEN STERILE (TOWEL DISPOSABLE) ×6 IMPLANT
TOWEL GREEN STERILE FF (TOWEL DISPOSABLE) ×3 IMPLANT
TRAY FOLEY SLVR 16FR LF STAT (SET/KITS/TRAYS/PACK) ×1 IMPLANT
YANKAUER SUCT BULB TIP NO VENT (SUCTIONS) ×1 IMPLANT

## 2021-10-26 NOTE — Progress Notes (Signed)
Orthopedic Tech Progress Note Patient Details:  Molly [name] [birth year] [id number] Called in order to [employer] for ROM knee brace  Patient ID: Molly [name], female   DOB: Oct 01, 1985, 36 y.o.   MRN: [id number]  [name] 10/26/2021, 7:01 PM

## 2021-10-26 NOTE — Progress Notes (Signed)
Patient refused scheduled oral meds. Refusing nursing assessments after multiple attempts. Will try again when patient arrived back to the unit after surgical procedure.

## 2021-10-26 NOTE — Anesthesia Preprocedure Evaluation (Addendum)
Anesthesia Evaluation  Patient identified by MRN, date of birth, ID band Patient confused    Reviewed: Allergy & Precautions, NPO status , Patient's Chart, lab work & pertinent test results  History of Anesthesia Complications Negative for: history of anesthetic complications  Airway Mallampati: III  TM Distance: >3 FB Neck ROM: Full    Dental no notable dental hx. (+) Dental Advisory Given   Pulmonary asthma , Current Smoker and Patient abstained from smoking.,    Pulmonary exam normal        Cardiovascular hypertension, Normal cardiovascular exam     Neuro/Psych PSYCHIATRIC DISORDERS Depression Schizophrenia negative neurological ROS     GI/Hepatic negative GI ROS, (+)     substance abuse  alcohol use,   Endo/Other  diabetesMorbid obesity  Renal/GU negative Renal ROS    OAB     Musculoskeletal negative musculoskeletal ROS (+)   Abdominal   Peds  (+) ADHD Hematology  (+) Blood dyscrasia, anemia ,   Anesthesia Other Findings   Reproductive/Obstetrics                            Anesthesia Physical Anesthesia Plan  ASA: 3  Anesthesia Plan: General   Post-op Pain Management: Celebrex PO (pre-op)* and Tylenol PO (pre-op)*   Induction: Intravenous  PONV Risk Score and Plan: 3 and Ondansetron, Dexamethasone, Midazolam and Scopolamine patch - Pre-op  Airway Management Planned: Oral ETT  Additional Equipment:   Intra-op Plan:   Post-operative Plan: Extubation in OR  Informed Consent: I have reviewed the patients History and Physical, chart, labs and discussed the procedure including the risks, benefits and alternatives for the proposed anesthesia with the patient or authorized representative who has indicated his/her understanding and acceptance.     Dental advisory given  Plan Discussed with: Anesthesiologist and CRNA  Anesthesia Plan Comments:         Anesthesia  Quick Evaluation

## 2021-10-26 NOTE — Progress Notes (Signed)
Patient swinging and screaming  at nurses during patient care. Will continue to monitor.
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
## 2021-10-26 NOTE — Plan of Care (Signed)
  Problem: Education: Goal: Knowledge of General Education information will improve Description: Including pain rating scale, medication(s)/side effects and non-pharmacologic comfort measures Outcome: Progressing   Problem: Activity: Goal: Risk for activity intolerance will decrease Outcome: Progressing   Problem: Pain Managment: Goal: General experience of comfort will improve Outcome: Progressing   Problem: Skin Integrity: Goal: Risk for impaired skin integrity will decrease Outcome: Progressing   Problem: Health Behavior/Discharge Planning: Goal: Ability to manage health-related needs will improve Outcome: Not Progressing   Problem: Nutrition: Goal: Adequate nutrition will be maintained Outcome: Not Progressing   Problem: Coping: Goal: Level of anxiety will decrease Outcome: Not Progressing   Problem: Safety: Goal: Ability to remain free from injury will improve Outcome: Not Progressing
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
## 2021-10-27 ENCOUNTER — Other Ambulatory Visit: Payer: Self-pay

## 2021-10-27 ENCOUNTER — Encounter (HOSPITAL_COMMUNITY): Payer: Self-pay | Admitting: Internal Medicine

## 2021-10-27 ENCOUNTER — Inpatient Hospital Stay (HOSPITAL_COMMUNITY): Payer: 59

## 2021-10-27 DIAGNOSIS — S82201A Unspecified fracture of shaft of right tibia, initial encounter for closed fracture: Secondary | ICD-10-CM | POA: Diagnosis not present

## 2021-10-27 DIAGNOSIS — F201 Disorganized schizophrenia: Secondary | ICD-10-CM | POA: Diagnosis not present

## 2021-10-27 DIAGNOSIS — S82401A Unspecified fracture of shaft of right fibula, initial encounter for closed fracture: Secondary | ICD-10-CM | POA: Diagnosis not present

## 2021-10-27 DIAGNOSIS — F23 Brief psychotic disorder: Secondary | ICD-10-CM | POA: Diagnosis not present

## 2021-10-27 LAB — BASIC METABOLIC PANEL
Anion gap: 9 (ref 5–15)
BUN: <5 mg/dL — ABNORMAL LOW (ref 6–20)
CO2: 23 mmol/L (ref 22–32)
Calcium: 8.7 mg/dL — ABNORMAL LOW (ref 8.9–10.3)
Chloride: 103 mmol/L (ref 98–111)
Creatinine, Ser: 0.6 mg/dL (ref 0.44–1.00)
GFR, Estimated: >60 mL/min (ref >60)
Glucose, Bld: 113 mg/dL — ABNORMAL HIGH (ref 70–99)
Potassium: 3.5 mmol/L (ref 3.5–5.1)
Sodium: 135 mmol/L (ref 135–145)

## 2021-10-27 LAB — CBC
HCT: 30.2 % — ABNORMAL LOW (ref 36.0–46.0)
Hemoglobin: 10.2 g/dL — ABNORMAL LOW (ref 12.0–15.0)
MCH: 28.2 pg (ref 26.0–34.0)
MCHC: 33.8 g/dL (ref 30.0–36.0)
MCV: 83.4 fL (ref 80.0–100.0)
Platelets: 355 10*3/uL (ref 150–400)
RBC: 3.62 MIL/uL — ABNORMAL LOW (ref 3.87–5.11)
RDW: 15.3 % (ref 11.5–15.5)
WBC: 14 10*3/uL — ABNORMAL HIGH (ref 4.0–10.5)
nRBC: 0 % (ref 0.0–0.2)

## 2021-10-27 LAB — VITAMIN D 25 HYDROXY (VIT D DEFICIENCY, FRACTURES): Vit D, 25-Hydroxy: 26.78 ng/mL — ABNORMAL LOW (ref 30–100)

## 2021-10-27 MED ORDER — OLANZAPINE 5 MG PO TABS
5.0000 mg | ORAL_TABLET | Freq: Every day | ORAL | Status: AC
  Administered 2021-10-27: 5 mg via ORAL
  Filled 2021-10-27: qty 1

## 2021-10-27 MED ORDER — LACTATED RINGERS IV SOLN: INTRAVENOUS | Status: DC

## 2021-10-27 MED ORDER — ENOXAPARIN SODIUM 40 MG/0.4ML IJ SOSY
40.0000 mg | PREFILLED_SYRINGE | INTRAMUSCULAR | Status: DC
  Filled 2021-10-27: qty 0.4

## 2021-10-27 MED ORDER — RIVAROXABAN 10 MG PO TABS
10.0000 mg | ORAL_TABLET | Freq: Every day | ORAL | Status: AC
  Administered 2021-10-27 – 2021-11-25 (×28): 10 mg via ORAL
  Filled 2021-10-27 (×29): qty 1

## 2021-10-27 MED ORDER — OLANZAPINE 10 MG IM SOLR
5.0000 mg | Freq: Every day | INTRAMUSCULAR | Status: AC
  Filled 2021-10-27: qty 10

## 2021-10-27 NOTE — Progress Notes (Signed)
OT Cancellation Note  Patient Details Name: [name] MRN: [id number] DOB: 01-Oct-1985   Cancelled Treatment:    Reason Eval/Treat Not Completed: Patient declined, no reason specified;Other (comment) Pt minimally answering questions and would not effectively engage in OT eval. Will follow up tomorrow for OT eval.  Lorre Munroe 10/27/2021, 1:54 PM
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
## 2021-10-28 DIAGNOSIS — S82401A Unspecified fracture of shaft of right fibula, initial encounter for closed fracture: Secondary | ICD-10-CM | POA: Diagnosis not present

## 2021-10-28 DIAGNOSIS — F201 Disorganized schizophrenia: Secondary | ICD-10-CM | POA: Diagnosis not present

## 2021-10-28 DIAGNOSIS — F23 Brief psychotic disorder: Secondary | ICD-10-CM | POA: Diagnosis not present

## 2021-10-28 DIAGNOSIS — S82201A Unspecified fracture of shaft of right tibia, initial encounter for closed fracture: Secondary | ICD-10-CM | POA: Diagnosis not present

## 2021-10-28 LAB — BASIC METABOLIC PANEL
Anion gap: 12 (ref 5–15)
BUN: <5 mg/dL — ABNORMAL LOW (ref 6–20)
CO2: 24 mmol/L (ref 22–32)
Calcium: 8.5 mg/dL — ABNORMAL LOW (ref 8.9–10.3)
Chloride: 101 mmol/L (ref 98–111)
Creatinine, Ser: 0.51 mg/dL (ref 0.44–1.00)
GFR, Estimated: >60 mL/min (ref >60)
Glucose, Bld: 113 mg/dL — ABNORMAL HIGH (ref 70–99)
Potassium: 3.6 mmol/L (ref 3.5–5.1)
Sodium: 137 mmol/L (ref 135–145)

## 2021-10-28 LAB — CBC
HCT: 29.1 % — ABNORMAL LOW (ref 36.0–46.0)
Hemoglobin: 9.4 g/dL — ABNORMAL LOW (ref 12.0–15.0)
MCH: 27.7 pg (ref 26.0–34.0)
MCHC: 32.3 g/dL (ref 30.0–36.0)
MCV: 85.8 fL (ref 80.0–100.0)
Platelets: 352 10*3/uL (ref 150–400)
RBC: 3.39 MIL/uL — ABNORMAL LOW (ref 3.87–5.11)
RDW: 15.7 % — ABNORMAL HIGH (ref 11.5–15.5)
WBC: 11.5 10*3/uL — ABNORMAL HIGH (ref 4.0–10.5)
nRBC: 0 % (ref 0.0–0.2)

## 2021-10-28 MED ORDER — OLANZAPINE 10 MG PO TABS
10.0000 mg | ORAL_TABLET | Freq: Every day | ORAL | Status: AC
  Administered 2021-10-28: 10 mg via ORAL
  Filled 2021-10-28: qty 1

## 2021-10-28 MED ORDER — DIVALPROEX SODIUM 250 MG PO DR TAB
500.0000 mg | DELAYED_RELEASE_TABLET | Freq: Two times a day (BID) | ORAL | Status: DC
  Administered 2021-10-28 – 2021-11-01 (×8): 500 mg via ORAL
  Filled 2021-10-28 (×8): qty 2
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
## 2021-10-28 NOTE — Care Management Important Message (Signed)
Important Message  Patient Details  Name: VENA BASSINGER MRN: [id number] Date of Birth: 1985/06/04   Medicare Important Message Given:  Yes     Dorena Bodo 10/28/2021, 2:47 PM
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
## 2021-10-28 NOTE — Progress Notes (Signed)
Pt is agitated this morning, complaining about right leg pain. Oxycodone and Zyprexa are given. Dr August Saucer is notified.
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
## 2021-10-28 NOTE — TOC Progression Note (Addendum)
Transition of Care [facility]) - Progression Note    Patient Details  Name: Molly [name] MRN: [id number] Date of Birth: 1985-12-25  Transition of [facility]) CM/SW Contact  Carley Hammed, [place] Phone Number: 10/28/2021, 1:07 PM  Clinical Narrative:     Psych requested CSW contact insurance company to determine facilities that are in network. CSW was advised of [address] and [facility], Psych team updated. Referrals were faxed out to [facility] and [facility]. No response yet. TOC will be available for further needs.   [facility] has declined.    Expected Discharge Plan and Services                                                 Social Determinants of Health (SDOH) Interventions    Readmission Risk Interventions     No data to display

## 2021-10-29 DIAGNOSIS — S82201A Unspecified fracture of shaft of right tibia, initial encounter for closed fracture: Secondary | ICD-10-CM | POA: Diagnosis not present

## 2021-10-29 DIAGNOSIS — F201 Disorganized schizophrenia: Secondary | ICD-10-CM | POA: Diagnosis not present

## 2021-10-29 DIAGNOSIS — F23 Brief psychotic disorder: Secondary | ICD-10-CM | POA: Diagnosis not present

## 2021-10-29 DIAGNOSIS — S82401A Unspecified fracture of shaft of right fibula, initial encounter for closed fracture: Secondary | ICD-10-CM | POA: Diagnosis not present

## 2021-10-29 LAB — CBC
HCT: 28 % — ABNORMAL LOW (ref 36.0–46.0)
Hemoglobin: 9.6 g/dL — ABNORMAL LOW (ref 12.0–15.0)
MCH: 28.8 pg (ref 26.0–34.0)
MCHC: 34.3 g/dL (ref 30.0–36.0)
MCV: 84.1 fL (ref 80.0–100.0)
Platelets: 376 10*3/uL (ref 150–400)
RBC: 3.33 MIL/uL — ABNORMAL LOW (ref 3.87–5.11)
RDW: 15.4 % (ref 11.5–15.5)
WBC: 10.6 10*3/uL — ABNORMAL HIGH (ref 4.0–10.5)
nRBC: 0 % (ref 0.0–0.2)

## 2021-10-29 LAB — BASIC METABOLIC PANEL
Anion gap: 7 (ref 5–15)
BUN: <5 mg/dL — ABNORMAL LOW (ref 6–20)
CO2: 28 mmol/L (ref 22–32)
Calcium: 8.5 mg/dL — ABNORMAL LOW (ref 8.9–10.3)
Chloride: 104 mmol/L (ref 98–111)
Creatinine, Ser: 0.54 mg/dL (ref 0.44–1.00)
GFR, Estimated: >60 mL/min (ref >60)
Glucose, Bld: 99 mg/dL (ref 70–99)
Potassium: 3.7 mmol/L (ref 3.5–5.1)
Sodium: 139 mmol/L (ref 135–145)

## 2021-10-29 MED ORDER — HYDROMORPHONE HCL 1 MG/ML IJ SOLN
0.5000 mg | INTRAMUSCULAR | Status: DC | PRN
  Filled 2021-10-29 (×2): qty 1

## 2021-10-29 MED ORDER — LORAZEPAM 2 MG/ML IJ SOLN
2.0000 mg | Freq: Once | INTRAMUSCULAR | Status: DC | PRN
Start: 2021-10-29 — End: 2021-10-29

## 2021-10-29 MED ORDER — OLANZAPINE 10 MG IM SOLR
10.0000 mg | Freq: Every day | INTRAMUSCULAR | Status: AC
Start: 2021-10-29 — End: 2021-10-29
  Filled 2021-10-29: qty 10

## 2021-10-29 MED ORDER — LORAZEPAM 2 MG/ML IJ SOLN: 2.0000 mg | Freq: Three times a day (TID) | INTRAMUSCULAR | Status: DC | PRN

## 2021-10-29 MED ORDER — DIPHENHYDRAMINE HCL 50 MG/ML IJ SOLN
50.0000 mg | Freq: Once | INTRAMUSCULAR | Status: DC | PRN
Start: 2021-10-29 — End: 2021-11-23

## 2021-10-29 MED ORDER — OLANZAPINE 10 MG PO TABS
20.0000 mg | ORAL_TABLET | Freq: Every day | ORAL | Status: DC
  Administered 2021-10-29 – 2021-11-05 (×8): 20 mg via ORAL
  Filled 2021-10-29 (×8): qty 2

## 2021-10-29 MED ORDER — OLANZAPINE 10 MG IM SOLR
10.0000 mg | Freq: Every day | INTRAMUSCULAR | Status: DC
Start: 2021-10-29 — End: 2021-11-06
  Filled 2021-10-29 (×8): qty 10

## 2021-10-29 MED ORDER — OLANZAPINE 10 MG PO TABS
10.0000 mg | ORAL_TABLET | Freq: Every day | ORAL | Status: AC
  Administered 2021-10-29: 10 mg via ORAL
  Filled 2021-10-29: qty 1

## 2021-10-29 MED ORDER — ONDANSETRON 4 MG PO TBDP
4.0000 mg | ORAL_TABLET | Freq: Two times a day (BID) | ORAL | Status: DC | PRN
  Administered 2021-11-17 – 2021-12-03 (×4): 4 mg via ORAL
  Filled 2021-10-29 (×5): qty 1

## 2021-10-29 MED ORDER — LORAZEPAM 1 MG PO TABS
2.0000 mg | ORAL_TABLET | Freq: Three times a day (TID) | ORAL | Status: DC | PRN
  Administered 2021-10-29 – 2021-10-30 (×3): 2 mg via ORAL
  Filled 2021-10-29 (×3): qty 2

## 2021-10-29 MED ORDER — OXYCODONE HCL 5 MG PO TABS
10.0000 mg | ORAL_TABLET | Freq: Four times a day (QID) | ORAL | Status: DC | PRN
  Administered 2021-10-29 – 2021-11-17 (×32): 10 mg via ORAL
  Filled 2021-10-29 (×34): qty 2

## 2021-10-29 MED ORDER — LORAZEPAM 1 MG PO TABS: 2.0000 mg | ORAL_TABLET | Freq: Three times a day (TID) | ORAL | Status: DC | PRN
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
## 2021-10-30 ENCOUNTER — Encounter (HOSPITAL_COMMUNITY): Payer: Self-pay | Admitting: Orthopedic Surgery

## 2021-10-30 DIAGNOSIS — F201 Disorganized schizophrenia: Secondary | ICD-10-CM | POA: Diagnosis not present

## 2021-10-30 DIAGNOSIS — S82401A Unspecified fracture of shaft of right fibula, initial encounter for closed fracture: Secondary | ICD-10-CM | POA: Diagnosis not present

## 2021-10-30 DIAGNOSIS — S82201A Unspecified fracture of shaft of right tibia, initial encounter for closed fracture: Secondary | ICD-10-CM | POA: Diagnosis not present

## 2021-10-30 DIAGNOSIS — F23 Brief psychotic disorder: Secondary | ICD-10-CM | POA: Diagnosis not present

## 2021-10-30 LAB — BASIC METABOLIC PANEL
Anion gap: 9 (ref 5–15)
BUN: 5 mg/dL — ABNORMAL LOW (ref 6–20)
CO2: 27 mmol/L (ref 22–32)
Calcium: 8.9 mg/dL (ref 8.9–10.3)
Chloride: 104 mmol/L (ref 98–111)
Creatinine, Ser: 0.63 mg/dL (ref 0.44–1.00)
GFR, Estimated: >60 mL/min (ref >60)
Glucose, Bld: 110 mg/dL — ABNORMAL HIGH (ref 70–99)
Potassium: 3.8 mmol/L (ref 3.5–5.1)
Sodium: 140 mmol/L (ref 135–145)

## 2021-10-30 LAB — CBC
HCT: 29 % — ABNORMAL LOW (ref 36.0–46.0)
Hemoglobin: 9.4 g/dL — ABNORMAL LOW (ref 12.0–15.0)
MCH: 27.3 pg (ref 26.0–34.0)
MCHC: 32.4 g/dL (ref 30.0–36.0)
MCV: 84.3 fL (ref 80.0–100.0)
Platelets: 419 10*3/uL — ABNORMAL HIGH (ref 150–400)
RBC: 3.44 MIL/uL — ABNORMAL LOW (ref 3.87–5.11)
RDW: 15.4 % (ref 11.5–15.5)
WBC: 10.7 10*3/uL — ABNORMAL HIGH (ref 4.0–10.5)
nRBC: 0 % (ref 0.0–0.2)

## 2021-10-30 MED ORDER — OLANZAPINE 10 MG PO TABS
10.0000 mg | ORAL_TABLET | Freq: Every day | ORAL | Status: DC
  Administered 2021-10-30 – 2021-11-06 (×8): 10 mg via ORAL
  Filled 2021-10-30 (×8): qty 1

## 2021-10-30 MED ORDER — LORAZEPAM 2 MG/ML IJ SOLN: 2.0000 mg | Freq: Three times a day (TID) | INTRAMUSCULAR | Status: AC | PRN

## 2021-10-30 MED ORDER — OLANZAPINE 10 MG IM SOLR
10.0000 mg | Freq: Every day | INTRAMUSCULAR | Status: DC
Start: 2021-10-30 — End: 2021-11-06
  Filled 2021-10-30 (×8): qty 10

## 2021-10-30 MED ORDER — OLANZAPINE 10 MG PO TABS: 10.0000 mg | ORAL_TABLET | Freq: Every day | ORAL | Status: DC

## 2021-10-30 MED ORDER — OLANZAPINE 10 MG IM SOLR: 10.0000 mg | Freq: Every day | INTRAMUSCULAR | Status: DC

## 2021-10-30 MED ORDER — LORAZEPAM 1 MG PO TABS
2.0000 mg | ORAL_TABLET | Freq: Once | ORAL | Status: AC | PRN
  Administered 2021-10-31: 2 mg via ORAL
  Filled 2021-10-30: qty 2
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
## 2021-10-30 NOTE — Progress Notes (Signed)
PT Cancellation Note  Patient Details Name: Molly [name] MRN: [id number] DOB: March 03, 1986   Cancelled Treatment:    Reason Eval/Treat Not Completed: Patient declined, no reason specified;Pain limiting ability to participate Patient declining mobility as she just started her "menstrual" and does not want to move or stand to change the pad underneath her. Provided patient with fresh feminine pad but patient stated "it doesn't want me". Will re-attempt as time and schedule allows.   Zabria Liss A. Dan Humphreys PT, DPT Acute Rehabilitation Services Office [phone]    Viviann Spare 10/30/2021, 3:49 PM

## 2021-10-30 NOTE — Plan of Care (Signed)
  Problem: Clinical Measurements: Goal: Respiratory complications will improve Outcome: Progressing   Problem: Pain Managment: Goal: General experience of comfort will improve Outcome: Progressing   Problem: Safety: Goal: Ability to remain free from injury will improve Outcome: Progressing   Problem: Skin Integrity: Goal: Risk for impaired skin integrity will decrease Outcome: Progressing   
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
## 2021-10-31 DIAGNOSIS — F201 Disorganized schizophrenia: Secondary | ICD-10-CM | POA: Diagnosis not present

## 2021-10-31 DIAGNOSIS — S82401A Unspecified fracture of shaft of right fibula, initial encounter for closed fracture: Secondary | ICD-10-CM | POA: Diagnosis not present

## 2021-10-31 DIAGNOSIS — F23 Brief psychotic disorder: Secondary | ICD-10-CM | POA: Diagnosis not present

## 2021-10-31 DIAGNOSIS — S82201A Unspecified fracture of shaft of right tibia, initial encounter for closed fracture: Secondary | ICD-10-CM | POA: Diagnosis not present

## 2021-10-31 LAB — BASIC METABOLIC PANEL
Anion gap: 8 (ref 5–15)
BUN: 6 mg/dL (ref 6–20)
CO2: 27 mmol/L (ref 22–32)
Calcium: 8.7 mg/dL — ABNORMAL LOW (ref 8.9–10.3)
Chloride: 103 mmol/L (ref 98–111)
Creatinine, Ser: 0.5 mg/dL (ref 0.44–1.00)
GFR, Estimated: >60 mL/min (ref >60)
Glucose, Bld: 115 mg/dL — ABNORMAL HIGH (ref 70–99)
Potassium: 3.9 mmol/L (ref 3.5–5.1)
Sodium: 138 mmol/L (ref 135–145)

## 2021-10-31 LAB — LIPID PANEL
Cholesterol: 145 mg/dL (ref 0–200)
HDL: 33 mg/dL — ABNORMAL LOW (ref >40)
LDL Cholesterol: 95 mg/dL (ref 0–99)
Total CHOL/HDL Ratio: 4.4 RATIO
Triglycerides: 83 mg/dL (ref <150)
VLDL: 17 mg/dL (ref 0–40)

## 2021-10-31 LAB — CBC
HCT: 27.6 % — ABNORMAL LOW (ref 36.0–46.0)
Hemoglobin: 9.3 g/dL — ABNORMAL LOW (ref 12.0–15.0)
MCH: 28.4 pg (ref 26.0–34.0)
MCHC: 33.7 g/dL (ref 30.0–36.0)
MCV: 84.4 fL (ref 80.0–100.0)
Platelets: 412 10*3/uL — ABNORMAL HIGH (ref 150–400)
RBC: 3.27 MIL/uL — ABNORMAL LOW (ref 3.87–5.11)
RDW: 15.2 % (ref 11.5–15.5)
WBC: 12 10*3/uL — ABNORMAL HIGH (ref 4.0–10.5)
nRBC: 0 % (ref 0.0–0.2)

## 2021-10-31 LAB — HEMOGLOBIN A1C
Hgb A1c MFr Bld: 5.4 % (ref 4.8–5.6)
Mean Plasma Glucose: 108.28 mg/dL

## 2021-10-31 NOTE — Progress Notes (Signed)
OT Cancellation Note  Patient Details Name: Molly [name] MRN: [id number] DOB: Sep 23, 1985   Cancelled Treatment:    Reason Eval/Treat Not Completed: Fatigue/lethargy limiting ability to participate;Other (comment). Pt sleeping heavily upon OT arrival, sitter present. OT will follow up later today as time allows  Galen Manila 10/31/2021, 9:59 AM

## 2021-10-31 NOTE — Plan of Care (Signed)
  ress Problem: Elimination: Goal: Will not experience complications related to bowel motility Outcome: Not Progressing   Problem: Pain Managment: Goal: General experience of comfort will improve Outcome: Progressing   Problem: Safety: Goal: Ability to remain free from injury will improve Outcome: Progressing
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
## 2021-11-01 DIAGNOSIS — S82201A Unspecified fracture of shaft of right tibia, initial encounter for closed fracture: Secondary | ICD-10-CM | POA: Diagnosis not present

## 2021-11-01 DIAGNOSIS — F23 Brief psychotic disorder: Secondary | ICD-10-CM | POA: Diagnosis not present

## 2021-11-01 DIAGNOSIS — F201 Disorganized schizophrenia: Secondary | ICD-10-CM | POA: Diagnosis not present

## 2021-11-01 DIAGNOSIS — S82401A Unspecified fracture of shaft of right fibula, initial encounter for closed fracture: Secondary | ICD-10-CM | POA: Diagnosis not present

## 2021-11-01 LAB — VALPROIC ACID LEVEL: Valproic Acid Lvl: 38 ug/mL — ABNORMAL LOW (ref 50.0–100.0)

## 2021-11-01 MED ORDER — LORAZEPAM 2 MG/ML IJ SOLN: 0.5000 mg | Freq: Once | INTRAMUSCULAR | Status: DC | PRN

## 2021-11-01 MED ORDER — DIVALPROEX SODIUM 250 MG PO DR TAB
500.0000 mg | DELAYED_RELEASE_TABLET | Freq: Every morning | ORAL | Status: DC
  Administered 2021-11-02 – 2021-11-03 (×2): 500 mg via ORAL
  Filled 2021-11-01 (×2): qty 2

## 2021-11-01 MED ORDER — DIVALPROEX SODIUM 250 MG PO DR TAB
1000.0000 mg | DELAYED_RELEASE_TABLET | Freq: Every day | ORAL | Status: DC
  Administered 2021-11-01 – 2021-11-03 (×3): 1000 mg via ORAL
  Filled 2021-11-01 (×3): qty 4
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
## 2021-11-01 NOTE — Progress Notes (Addendum)
Spoke with pt. About the strips on purse and stated I needed to take them off. She said that she would let me if they were removable and she showed me that the are not, she refused to give me the bag will attempt again attending and physic Doc notified

## 2021-11-01 NOTE — Progress Notes (Signed)
Pt refused dressing change on multiple attempts. Pt encouraged . Will continue to monitor.

## 2021-11-01 NOTE — TOC Progression Note (Addendum)
Transition of Care [facility]) - Progression Note    Patient Details  Name: Molly [name] MRN: [id number] Date of Birth: 06/03/1985  [facility] CM/SW Contact  Donnalee Curry, LCSWA Phone Number: 11/01/2021, 1:45 PM  Clinical Narrative:     SW informed team wants to renew IVC. Forms signed and faxed to weekend magistrate :[phone]. Awaiting response.  Update 444pm SW received custody order. SW called [facility] Non-emergent requested pt be served order. 3 copies made and given to [place] for [employer].      Expected Discharge Plan and Services                                                 Social Determinants of Health (SDOH) Interventions    Readmission Risk Interventions     No data to display

## 2021-11-01 NOTE — Progress Notes (Signed)
Pt. Allowed me to apply mepilex to upper LLE refused Lower part also refused leg brace.

## 2021-11-01 NOTE — Plan of Care (Signed)

## 2021-11-02 ENCOUNTER — Encounter (HOSPITAL_COMMUNITY)
Admission: EM | Disposition: A | Discharge status: Other Acute Inpatient Hospital | Payer: Self-pay | Source: Home / Self Care | Attending: Internal Medicine

## 2021-11-02 ENCOUNTER — Encounter (HOSPITAL_COMMUNITY): Payer: Self-pay | Admitting: Certified Registered"

## 2021-11-02 DIAGNOSIS — F201 Disorganized schizophrenia: Secondary | ICD-10-CM

## 2021-11-02 DIAGNOSIS — S82201A Unspecified fracture of shaft of right tibia, initial encounter for closed fracture: Secondary | ICD-10-CM | POA: Diagnosis not present

## 2021-11-02 DIAGNOSIS — S82401A Unspecified fracture of shaft of right fibula, initial encounter for closed fracture: Secondary | ICD-10-CM | POA: Diagnosis not present

## 2021-11-02 SURGERY — MRI WITH ANESTHESIA
Anesthesia: General

## 2021-11-02 MED ORDER — HALOPERIDOL 1 MG PO TABS
2.0000 mg | ORAL_TABLET | Freq: Two times a day (BID) | ORAL | Status: DC
  Filled 2021-11-02 (×2): qty 2
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
## 2021-11-02 NOTE — Progress Notes (Signed)
Dr. Hart Rochester with anesthesia made aware of the pt refusing to have the MRI procedure. Wiil call the ordering physician.

## 2021-11-02 NOTE — Progress Notes (Signed)
Made NPO at this time for MRI w/ anesthesia

## 2021-11-02 NOTE — Progress Notes (Signed)
Upon pt arrival to Short Stay, the pt is refusing to get undressed for the MRI procedure. She is also refusing to get IV access as well. The pt states, "I don't need a MRI, I'm fine."

## 2021-11-02 NOTE — Consult Note (Signed)
Attempted to assess patient x2 on this day.  Patient is scheduled for MRI of knee.  Will attempt to reassess patient tomorrow.  Chart review shows patient continues to present with psychosis, disorganized thoughts, increase in agitation.  Patient continues to present with ongoing decompensation of schizoaffective disorder, requiring need for inpatient.   Patient will likely need second antipsychotic, with the goal to transition to long-acting injectable.  Will need to reassess prior to making adjustments. -Continue current medications, valproic acid level of 38 recently adjusted dose yesterday. -She needs inpatient for acute hospital medication management, crisis stabilization, and decompensating schizophrenia.   -Psychiatry will continue to follow at this time.
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
## 2021-11-02 NOTE — Progress Notes (Signed)
Dr. Carola Frost made aware that the pt is refusing to have the MRI procedure. The pt will be transported back to the floor.

## 2021-11-02 NOTE — Progress Notes (Signed)
PT Cancellation Note  Patient Details Name: Molly [name] MRN: [id number] DOB: July 26, 1985   Cancelled Treatment:    Reason Eval/Treat Not Completed: Patient at procedure or test/unavailable. Pt's RN requesting to hold PT at this time as pt is being transported for an MRI soon. PT will continue to follow-up with pt acutely as available and appropriate.    Alessandra Bevels Zayley Arras 11/02/2021, 8:49 AM

## 2021-11-02 NOTE — Progress Notes (Signed)
OT Cancellation Note  Patient Details Name: SHILO PHILIPSON MRN: [id number] DOB: 04-Sep-1985   Cancelled Treatment:    Reason Eval/Treat Not Completed: Patient at procedure or test/ unavailable (Patient out of room for MRI)  Alfonse Flavors, OTA Acute Rehabilitation Services  Office [phone]  Dewain Penning 11/02/2021, 10:19 AM
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
## 2021-11-02 NOTE — Plan of Care (Signed)

## 2021-11-02 NOTE — Progress Notes (Signed)
Telephone consent received from the legal guardian Ms. [name] [phone]. Please call Ms. Woolard with any matters for the pt, and to obtain legal consent for procedures.

## 2021-11-03 DIAGNOSIS — S82401A Unspecified fracture of shaft of right fibula, initial encounter for closed fracture: Secondary | ICD-10-CM | POA: Diagnosis not present

## 2021-11-03 DIAGNOSIS — F23 Brief psychotic disorder: Secondary | ICD-10-CM | POA: Diagnosis not present

## 2021-11-03 DIAGNOSIS — S82201A Unspecified fracture of shaft of right tibia, initial encounter for closed fracture: Secondary | ICD-10-CM | POA: Diagnosis not present

## 2021-11-03 DIAGNOSIS — F201 Disorganized schizophrenia: Secondary | ICD-10-CM | POA: Diagnosis not present

## 2021-11-03 LAB — CBC
HCT: 28.4 % — ABNORMAL LOW (ref 36.0–46.0)
Hemoglobin: 9.3 g/dL — ABNORMAL LOW (ref 12.0–15.0)
MCH: 28 pg (ref 26.0–34.0)
MCHC: 32.7 g/dL (ref 30.0–36.0)
MCV: 85.5 fL (ref 80.0–100.0)
Platelets: 514 10*3/uL — ABNORMAL HIGH (ref 150–400)
RBC: 3.32 MIL/uL — ABNORMAL LOW (ref 3.87–5.11)
RDW: 15.4 % (ref 11.5–15.5)
WBC: 11.7 10*3/uL — ABNORMAL HIGH (ref 4.0–10.5)
nRBC: 0 % (ref 0.0–0.2)
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
## 2021-11-03 NOTE — Plan of Care (Signed)
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
## 2021-11-04 DIAGNOSIS — S82401A Unspecified fracture of shaft of right fibula, initial encounter for closed fracture: Secondary | ICD-10-CM | POA: Diagnosis not present

## 2021-11-04 DIAGNOSIS — F201 Disorganized schizophrenia: Secondary | ICD-10-CM | POA: Diagnosis not present

## 2021-11-04 DIAGNOSIS — S82201A Unspecified fracture of shaft of right tibia, initial encounter for closed fracture: Secondary | ICD-10-CM | POA: Diagnosis not present

## 2021-11-04 LAB — VALPROIC ACID LEVEL: Valproic Acid Lvl: 53 ug/mL (ref 50.0–100.0)

## 2021-11-04 MED ORDER — DIVALPROEX SODIUM 250 MG PO DR TAB
1000.0000 mg | DELAYED_RELEASE_TABLET | Freq: Two times a day (BID) | ORAL | Status: DC
  Administered 2021-11-04 – 2021-11-09 (×11): 1000 mg via ORAL
  Filled 2021-11-04 (×11): qty 4
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
## 2021-11-05 DIAGNOSIS — F201 Disorganized schizophrenia: Secondary | ICD-10-CM | POA: Diagnosis not present

## 2021-11-05 DIAGNOSIS — S82401A Unspecified fracture of shaft of right fibula, initial encounter for closed fracture: Secondary | ICD-10-CM | POA: Diagnosis not present

## 2021-11-05 DIAGNOSIS — S82201A Unspecified fracture of shaft of right tibia, initial encounter for closed fracture: Secondary | ICD-10-CM | POA: Diagnosis not present

## 2021-11-05 LAB — CBC
HCT: 28.4 % — ABNORMAL LOW (ref 36.0–46.0)
Hemoglobin: 9.3 g/dL — ABNORMAL LOW (ref 12.0–15.0)
MCH: 27.7 pg (ref 26.0–34.0)
MCHC: 32.7 g/dL (ref 30.0–36.0)
MCV: 84.5 fL (ref 80.0–100.0)
Platelets: 543 10*3/uL — ABNORMAL HIGH (ref 150–400)
RBC: 3.36 MIL/uL — ABNORMAL LOW (ref 3.87–5.11)
RDW: 15.5 % (ref 11.5–15.5)
WBC: 10.6 10*3/uL — ABNORMAL HIGH (ref 4.0–10.5)
nRBC: 0.2 % (ref 0.0–0.2)
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
## 2021-11-05 NOTE — TOC Progression Note (Addendum)
Transition of Care [facility]) - Progression Note    Patient Details  Name: [name] MRN: [id number] Date of Birth: Aug 20, 1985  [facility] of [facility] CM/SW Contact  Lorri Frederick, [name] Phone Number: 11/05/2021, 4:07 PM  Clinical Narrative:   CSW spoke with [facility].  Pt would need to be independent with ADLs to be admitted to their facility, cannot accept this pt.  Per Length of stay meeting  recommendation, CSW messaged with Nanine Means NP regarding potential admit to [facility].  No beds available for admission there.     Expected Discharge Plan: Psychiatric Hospital Barriers to Discharge: Continued Medical Work up  Expected Discharge Plan and Services Expected Discharge Plan: Psychiatric Hospital In-house Referral: Clinical Social Work     Living arrangements for the past 2 months: Homeless                                       Social Determinants of Health (SDOH) Interventions    Readmission Risk Interventions     No data to display
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
## 2021-11-06 DIAGNOSIS — S82201A Unspecified fracture of shaft of right tibia, initial encounter for closed fracture: Secondary | ICD-10-CM | POA: Diagnosis not present

## 2021-11-06 DIAGNOSIS — F201 Disorganized schizophrenia: Secondary | ICD-10-CM | POA: Diagnosis not present

## 2021-11-06 DIAGNOSIS — S82401A Unspecified fracture of shaft of right fibula, initial encounter for closed fracture: Secondary | ICD-10-CM | POA: Diagnosis not present

## 2021-11-06 MED ORDER — OLANZAPINE 10 MG PO TABS
20.0000 mg | ORAL_TABLET | Freq: Every day | ORAL | Status: DC
  Administered 2021-11-06 – 2021-11-07 (×2): 20 mg via ORAL
  Filled 2021-11-06 (×3): qty 2

## 2021-11-06 MED ORDER — PALIPERIDONE ER 3 MG PO TB24
3.0000 mg | ORAL_TABLET | Freq: Every day | ORAL | Status: DC
  Administered 2021-11-06 – 2021-11-08 (×3): 3 mg via ORAL
  Filled 2021-11-06 (×3): qty 1
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
## 2021-11-06 NOTE — TOC Progression Note (Signed)
Transition of Care [facility]) - Progression Note    Patient Details  Name: KAFI DOTTER MRN: [id number] Date of Birth: November 07, 1985  [facility] CM/SW Contact  Lorri Frederick, [name] Phone Number: 11/06/2021, 12:39 PM  Clinical Narrative:  CSW made referral to [facility], completed phone referral and then faxed regional referral form and referral documents.  Fax confirmation received.       Expected Discharge Plan: Psychiatric Hospital Barriers to Discharge: Continued Medical Work up  Expected Discharge Plan and Services Expected Discharge Plan: Psychiatric Hospital In-house Referral: Clinical Social Work     Living arrangements for the past 2 months: Homeless                                       Social Determinants of Health (SDOH) Interventions    Readmission Risk Interventions     No data to display

## 2021-11-07 DIAGNOSIS — S82401A Unspecified fracture of shaft of right fibula, initial encounter for closed fracture: Secondary | ICD-10-CM | POA: Diagnosis not present

## 2021-11-07 DIAGNOSIS — F201 Disorganized schizophrenia: Secondary | ICD-10-CM | POA: Diagnosis not present

## 2021-11-07 DIAGNOSIS — S82201A Unspecified fracture of shaft of right tibia, initial encounter for closed fracture: Secondary | ICD-10-CM | POA: Diagnosis not present

## 2021-11-07 NOTE — Plan of Care (Signed)
°  Problem: Clinical Measurements: °Goal: Will remain free from infection °Outcome: Progressing °  °Problem: Nutrition: °Goal: Adequate nutrition will be maintained °Outcome: Progressing °  °Problem: Coping: °Goal: Level of anxiety will decrease °Outcome: Progressing °  °
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
## 2021-11-07 NOTE — TOC Progression Note (Signed)
Transition of Care [facility]) - Progression Note    Patient Details  Name: Molly [name] MRN: [id number] Date of Birth: 10-14-1985  Transition of Care [facility]) CM/SW Contact  Lorri Frederick, [name] Phone Number: 11/07/2021, 10:05 AM  Clinical Narrative:   CSW refaxed referral, spoke with Teena Dunk in [facility] admission and he confirmed receipt.  Will not be reviewed by MD until Monday.    Expected Discharge Plan: Psychiatric Hospital Barriers to Discharge: Continued Medical Work up  Expected Discharge Plan and Services Expected Discharge Plan: Psychiatric Hospital In-house Referral: Clinical Social Work     Living arrangements for the past 2 months: Homeless                                       Social Determinants of Health (SDOH) Interventions    Readmission Risk Interventions     No data to display

## 2021-11-07 NOTE — TOC Progression Note (Signed)
Transition of Care [facility]) - Progression Note    Patient Details  Name: Molly [name] MRN: [id number] Date of Birth: 1985/08/06  Transition of Care [facility]) CM/SW Contact  Mearl Latin, [name] Phone Number: 11/07/2021, 9:34 AM  Clinical Narrative:    CSW called [facility] to check to make sure they do not need any other clinicals. Intake stated they have received verbal referral but have not received any paperwork on her. CSW will fax again.    Expected Discharge Plan: Psychiatric Hospital Barriers to Discharge: Continued Medical Work up  Expected Discharge Plan and Services Expected Discharge Plan: Psychiatric Hospital In-house Referral: Clinical Social Work     Living arrangements for the past 2 months: Homeless                                       Social Determinants of Health (SDOH) Interventions    Readmission Risk Interventions     No data to display

## 2021-11-07 NOTE — Plan of Care (Signed)
  Problem: Pain Managment: Goal: General experience of comfort will improve Outcome: Progressing   Problem: Safety: Goal: Ability to remain free from injury will improve Outcome: Progressing   Problem: Skin Integrity: Goal: Risk for impaired skin integrity will decrease Outcome: Progressing   

## 2021-11-08 DIAGNOSIS — S82201A Unspecified fracture of shaft of right tibia, initial encounter for closed fracture: Secondary | ICD-10-CM | POA: Diagnosis not present

## 2021-11-08 DIAGNOSIS — S82401A Unspecified fracture of shaft of right fibula, initial encounter for closed fracture: Secondary | ICD-10-CM | POA: Diagnosis not present

## 2021-11-08 DIAGNOSIS — F201 Disorganized schizophrenia: Secondary | ICD-10-CM | POA: Diagnosis not present

## 2021-11-08 LAB — VALPROIC ACID LEVEL: Valproic Acid Lvl: 52 ug/mL (ref 50.0–100.0)

## 2021-11-08 LAB — CBC
HCT: 29.2 % — ABNORMAL LOW (ref 36.0–46.0)
Hemoglobin: 9.4 g/dL — ABNORMAL LOW (ref 12.0–15.0)
MCH: 27.7 pg (ref 26.0–34.0)
MCHC: 32.2 g/dL (ref 30.0–36.0)
MCV: 86.1 fL (ref 80.0–100.0)
Platelets: 568 10*3/uL — ABNORMAL HIGH (ref 150–400)
RBC: 3.39 MIL/uL — ABNORMAL LOW (ref 3.87–5.11)
RDW: 16 % — ABNORMAL HIGH (ref 11.5–15.5)
WBC: 11 10*3/uL — ABNORMAL HIGH (ref 4.0–10.5)
nRBC: 0.2 % (ref 0.0–0.2)

## 2021-11-08 MED ORDER — PALIPERIDONE ER 6 MG PO TB24
6.0000 mg | ORAL_TABLET | Freq: Every day | ORAL | Status: DC
  Administered 2021-11-09 – 2021-11-10 (×2): 6 mg via ORAL
  Filled 2021-11-08 (×2): qty 1

## 2021-11-08 MED ORDER — OLANZAPINE 10 MG PO TABS
20.0000 mg | ORAL_TABLET | Freq: Every day | ORAL | Status: DC
  Administered 2021-11-08 – 2021-11-09 (×2): 20 mg via ORAL
  Filled 2021-11-08 (×3): qty 2
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
## 2021-11-08 NOTE — Progress Notes (Signed)
Patient ambulated without brace to shower using rolling front wheel walker.  Patient refused assistance by sitter and RN.  Patient states her dressing is okay and she doesn't need to have it changed.  Encouraged patient to use brace and allow for dressing to be changed post-shower.   MD evaluated at bedside and notified.
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
## 2021-11-08 NOTE — Progress Notes (Signed)
Patient allowed RN to give all medications, obtain VS x 1 and change mepilex dressings on right lower extremity today.  Patient continues to be noncompliant with NWB status for RLE.  Patient refuses assistance with ambulation to and from restroom/ BSC.  Patient intermittently yells and argues with self.  No PRNs available.  No IV access.  Sitter at bedside.
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
## 2021-11-08 NOTE — Progress Notes (Addendum)
Attempted to administer evening medications while the sitter attempted to get vitals on patient. Patient refused medications and vital signs, and raised her voice loudly and instructed [place] to leave her alone and get out of her room. Will continue to monitor.

## 2021-11-08 NOTE — Progress Notes (Signed)
Patient refusing to allow RN to assess patient wound underneath dressing.  Patient sitting up  on side of bed, refuses brace for Right leg and screaming.  Non- redirectable and not answering RN questions.  Patient taking breaks from screaming and talking/ cursing about people not in the room and about her daughter being taken from her.  Patient seemingly not oriented to place.

## 2021-11-09 DIAGNOSIS — F201 Disorganized schizophrenia: Secondary | ICD-10-CM | POA: Diagnosis not present

## 2021-11-09 DIAGNOSIS — S82201A Unspecified fracture of shaft of right tibia, initial encounter for closed fracture: Secondary | ICD-10-CM | POA: Diagnosis not present

## 2021-11-09 DIAGNOSIS — S82401A Unspecified fracture of shaft of right fibula, initial encounter for closed fracture: Secondary | ICD-10-CM | POA: Diagnosis not present

## 2021-11-09 LAB — COMPREHENSIVE METABOLIC PANEL
ALT: 10 U/L (ref 0–44)
AST: 17 U/L (ref 15–41)
Albumin: 3 g/dL — ABNORMAL LOW (ref 3.5–5.0)
Alkaline Phosphatase: 150 U/L — ABNORMAL HIGH (ref 38–126)
Anion gap: 11 (ref 5–15)
BUN: 7 mg/dL (ref 6–20)
CO2: 23 mmol/L (ref 22–32)
Calcium: 8.8 mg/dL — ABNORMAL LOW (ref 8.9–10.3)
Chloride: 105 mmol/L (ref 98–111)
Creatinine, Ser: 0.6 mg/dL (ref 0.44–1.00)
GFR, Estimated: >60 mL/min (ref >60)
Glucose, Bld: 110 mg/dL — ABNORMAL HIGH (ref 70–99)
Potassium: 4.1 mmol/L (ref 3.5–5.1)
Sodium: 139 mmol/L (ref 135–145)
Total Bilirubin: 0.4 mg/dL (ref 0.3–1.2)
Total Protein: 7 g/dL (ref 6.5–8.1)

## 2021-11-09 MED ORDER — OLANZAPINE 10 MG IM SOLR
5.0000 mg | Freq: Two times a day (BID) | INTRAMUSCULAR | Status: DC | PRN
Start: 2021-11-09 — End: 2021-11-12
  Administered 2021-11-11: 5 mg via INTRAMUSCULAR
  Filled 2021-11-09 (×2): qty 10

## 2021-11-09 MED ORDER — OLANZAPINE 5 MG PO TBDP
5.0000 mg | ORAL_TABLET | Freq: Two times a day (BID) | ORAL | Status: DC | PRN
  Administered 2021-11-10 – 2021-11-12 (×3): 5 mg via ORAL
  Filled 2021-11-09 (×4): qty 1

## 2021-11-09 MED ORDER — DIVALPROEX SODIUM 250 MG PO DR TAB
1250.0000 mg | DELAYED_RELEASE_TABLET | Freq: Two times a day (BID) | ORAL | Status: DC
  Administered 2021-11-09 – 2021-11-12 (×6): 1250 mg via ORAL
  Filled 2021-11-09 (×6): qty 5

## 2021-11-09 NOTE — Plan of Care (Addendum)
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
## 2021-11-10 ENCOUNTER — Inpatient Hospital Stay (HOSPITAL_COMMUNITY): Payer: 59

## 2021-11-10 DIAGNOSIS — F201 Disorganized schizophrenia: Secondary | ICD-10-CM | POA: Diagnosis not present

## 2021-11-10 DIAGNOSIS — S82201A Unspecified fracture of shaft of right tibia, initial encounter for closed fracture: Secondary | ICD-10-CM | POA: Diagnosis not present

## 2021-11-10 DIAGNOSIS — S82401A Unspecified fracture of shaft of right fibula, initial encounter for closed fracture: Secondary | ICD-10-CM | POA: Diagnosis not present

## 2021-11-10 MED ORDER — OLANZAPINE 10 MG PO TABS
10.0000 mg | ORAL_TABLET | Freq: Every day | ORAL | Status: DC
  Administered 2021-11-10 – 2021-11-22 (×13): 10 mg via ORAL
  Filled 2021-11-10 (×15): qty 1

## 2021-11-10 MED ORDER — POLYETHYLENE GLYCOL 3350 17 G PO PACK: 17.0000 g | PACK | Freq: Every day | ORAL | Status: DC | PRN

## 2021-11-10 MED ORDER — OLANZAPINE 10 MG PO TABS
20.0000 mg | ORAL_TABLET | Freq: Every day | ORAL | Status: DC
  Administered 2021-11-10 – 2021-11-22 (×13): 20 mg via ORAL
  Filled 2021-11-10 (×14): qty 2

## 2021-11-10 MED ORDER — DOCUSATE SODIUM 100 MG PO CAPS
100.0000 mg | ORAL_CAPSULE | Freq: Every day | ORAL | Status: DC
Start: 2021-11-10 — End: 2021-12-03
  Administered 2021-11-10 – 2021-12-03 (×23): 100 mg via ORAL
  Filled 2021-11-10 (×24): qty 1
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
## 2021-11-10 NOTE — Progress Notes (Signed)
**Note [name]-Identified via Obfuscation** OT Cancellation Note  Patient Details Name: Molly JACUINDE MRN: [id number] DOB: 03-Oct-1985   Cancelled Treatment:    Reason Eval/Treat Not Completed: Fatigue/lethargy limiting ability to participate Pt sleeping soundly on entry, did not awaken to voice. Will follow up again for OT session as schedule permits.  Lorre Munroe 11/10/2021, 12:10 PM
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
## 2021-11-10 NOTE — Progress Notes (Signed)
At shift change patient became boisterous and made the statement "Im gonna kill you all." About 5 minutes later, patient ambulated into the hallway using her walker stating that she needed a "pregnancy test" and to "talk to my doctor." Staff was able to deescalate the situation after 20 minutes and patient was escorted back to her room and agreeable to take her morning medications and eat breakfast at that time.
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
## 2021-11-11 DIAGNOSIS — S82401A Unspecified fracture of shaft of right fibula, initial encounter for closed fracture: Secondary | ICD-10-CM | POA: Diagnosis not present

## 2021-11-11 DIAGNOSIS — F201 Disorganized schizophrenia: Secondary | ICD-10-CM | POA: Diagnosis not present

## 2021-11-11 DIAGNOSIS — S82201A Unspecified fracture of shaft of right tibia, initial encounter for closed fracture: Secondary | ICD-10-CM | POA: Diagnosis not present

## 2021-11-11 LAB — CBC WITH DIFFERENTIAL/PLATELET
Abs Immature Granulocytes: 0.1 10*3/uL — ABNORMAL HIGH (ref 0.00–0.07)
Basophils Absolute: 0 10*3/uL (ref 0.0–0.1)
Basophils Relative: 0 %
Eosinophils Absolute: 0.1 10*3/uL (ref 0.0–0.5)
Eosinophils Relative: 1 %
HCT: 29.9 % — ABNORMAL LOW (ref 36.0–46.0)
Hemoglobin: 9.6 g/dL — ABNORMAL LOW (ref 12.0–15.0)
Immature Granulocytes: 1 %
Lymphocytes Relative: 25 %
Lymphs Abs: 2.6 10*3/uL (ref 0.7–4.0)
MCH: 27.6 pg (ref 26.0–34.0)
MCHC: 32.1 g/dL (ref 30.0–36.0)
MCV: 85.9 fL (ref 80.0–100.0)
Monocytes Absolute: 0.9 10*3/uL (ref 0.1–1.0)
Monocytes Relative: 9 %
Neutro Abs: 6.5 10*3/uL (ref 1.7–7.7)
Neutrophils Relative %: 64 %
Platelets: 581 10*3/uL — ABNORMAL HIGH (ref 150–400)
RBC: 3.48 MIL/uL — ABNORMAL LOW (ref 3.87–5.11)
RDW: 16.3 % — ABNORMAL HIGH (ref 11.5–15.5)
WBC: 10.3 10*3/uL (ref 4.0–10.5)
nRBC: 0.2 % (ref 0.0–0.2)

## 2021-11-11 LAB — COMPREHENSIVE METABOLIC PANEL
ALT: 8 U/L (ref 0–44)
AST: 20 U/L (ref 15–41)
Albumin: 2.9 g/dL — ABNORMAL LOW (ref 3.5–5.0)
Alkaline Phosphatase: 182 U/L — ABNORMAL HIGH (ref 38–126)
Anion gap: 11 (ref 5–15)
BUN: 8 mg/dL (ref 6–20)
CO2: 27 mmol/L (ref 22–32)
Calcium: 9 mg/dL (ref 8.9–10.3)
Chloride: 104 mmol/L (ref 98–111)
Creatinine, Ser: 0.63 mg/dL (ref 0.44–1.00)
GFR, Estimated: >60 mL/min (ref >60)
Glucose, Bld: 130 mg/dL — ABNORMAL HIGH (ref 70–99)
Potassium: 4 mmol/L (ref 3.5–5.1)
Sodium: 142 mmol/L (ref 135–145)
Total Bilirubin: 0.3 mg/dL (ref 0.3–1.2)
Total Protein: 6.4 g/dL — ABNORMAL LOW (ref 6.5–8.1)

## 2021-11-11 MED ORDER — STERILE WATER FOR INJECTION IJ SOLN
INTRAMUSCULAR | Status: AC
  Filled 2021-11-11: qty 10

## 2021-11-11 NOTE — Progress Notes (Signed)
PT Cancellation Note  Patient Details Name: Molly [name] MRN: [id number] DOB: Sep 06, 1985   Cancelled Treatment:    Reason Eval/Treat Not Completed: Other (comment) Pt agitated and yelling loudly in hallway; also not maintaining nonweightbearing status. Multiple staff members present to assist pt back into room safely. Will follow up.  Lillia Pauls, PT, DPT Acute Rehabilitation Services Office [phone]    Norval Morton 11/11/2021, 10:42 AM
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
## 2021-11-11 NOTE — Progress Notes (Signed)
Patient has been sleeping.  Sitter present in room.    At 2330, patient woke up in a very agitated mood.  Yelling and screaming and making all kinds of demands.   Patient directed this at pregnant sitter. "I don't want her in here!  She is pregnant!!  You are all Luficer's children!!"  "I don't want her here, she touched my walker!!"  Patient started to feed her pretend baby.  Patient does not want to interact with pregnant sitter.  Time: 0230 Patient continued to be extremely agitated by having a pregnant sitter.  Changed sitter to non- pregnant female.  Regarding order for EKG Patient is not allowing staff to "touch" her at this time.  Doing EKG is not feasible right now.

## 2021-11-11 NOTE — Progress Notes (Signed)
Pt escaped her room and refused to go back.  Pt yelled  " I am ready to leave right now, let me go."  She was also walking on her Rt leg that has orders for her to be non- weightbearing.  Pt was assisted back to her room by staff and calmed down.

## 2021-11-11 NOTE — Progress Notes (Signed)
Pt became upset after visit from Behavioral health team.  Zyprexa IM given.  See EMR.

## 2021-11-11 NOTE — Progress Notes (Signed)
EKG team paged times 2.

## 2021-11-11 NOTE — TOC Progression Note (Addendum)
Transition of Care [facility]) - Progression Note    Patient Details  Name: Molly [name] MRN: [id number] Date of Birth: 04/25/1986  [facility] CM/SW Contact  Lorri Frederick, [name] Phone Number: 11/11/2021, 10:46 AM  Clinical Narrative:   Updated psych notes faxed to [facility] along with update MD progress note.  1330: CSW spoke with Maurine Minister at [facility] Admissions.  They did receive the information.  Pt is on the priority list for admission, but, per Maurine Minister, "there are 50 people on that list."  Expected Discharge Plan: Psychiatric Hospital Barriers to Discharge: Continued Medical Work up  Expected Discharge Plan and Services Expected Discharge Plan: Psychiatric Hospital In-house Referral: Clinical Social Work     Living arrangements for the past 2 months: Homeless                                       Social Determinants of Health (SDOH) Interventions    Readmission Risk Interventions     No data to display

## 2021-11-11 NOTE — Plan of Care (Signed)
  Problem: Health Behavior/Discharge Planning: Goal: Ability to manage health-related needs will improve Outcome: Progressing   Problem: Clinical Measurements: Goal: Ability to maintain clinical measurements within normal limits will improve Outcome: Progressing Goal: Will remain free from infection Outcome: Progressing Goal: Diagnostic test results will improve Outcome: Progressing Goal: Respiratory complications will improve Outcome: Progressing Goal: Cardiovascular complication will be avoided Outcome: Progressing   Problem: Activity: Goal: Risk for activity intolerance will decrease Outcome: Progressing   Problem: Nutrition: Goal: Adequate nutrition will be maintained Outcome: Progressing   Problem: Elimination: Goal: Will not experience complications related to bowel motility Outcome: Progressing Goal: Will not experience complications related to urinary retention Outcome: Progressing   Problem: Pain Managment: Goal: General experience of comfort will improve Outcome: Progressing   Problem: Skin Integrity: Goal: Risk for impaired skin integrity will decrease Outcome: Progressing   

## 2021-11-11 NOTE — Progress Notes (Signed)
Patient is having another outburst. Yelling and screaming that she is dirty and her vagina is hurting.  She is getting up OOB against instructions.  She is yelling out "Stop"! To sitter who is only trying to keep her safe.  Patient continued yelling at the top of her lungs "Get out!! Lucifer, you fucking devil!!".  Patient is rambling on and on in the bathroom things that makes no sense.
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
## 2021-11-12 DIAGNOSIS — F201 Disorganized schizophrenia: Secondary | ICD-10-CM | POA: Diagnosis not present

## 2021-11-12 MED ORDER — OLANZAPINE 5 MG PO TBDP
10.0000 mg | ORAL_TABLET | Freq: Two times a day (BID) | ORAL | Status: DC | PRN
Start: 2021-11-12 — End: 2021-11-14
  Administered 2021-11-13: 10 mg via ORAL
  Filled 2021-11-12: qty 2

## 2021-11-12 MED ORDER — CLONAZEPAM 0.5 MG PO TABS
0.5000 mg | ORAL_TABLET | Freq: Two times a day (BID) | ORAL | Status: DC
  Administered 2021-11-13 – 2021-12-03 (×41): 0.5 mg via ORAL
  Filled 2021-11-12 (×42): qty 1

## 2021-11-12 MED ORDER — DIVALPROEX SODIUM 125 MG PO CSDR
1250.0000 mg | DELAYED_RELEASE_CAPSULE | Freq: Two times a day (BID) | ORAL | Status: DC
  Administered 2021-11-12 – 2021-12-02 (×39): 1250 mg via ORAL
  Filled 2021-11-12 (×41): qty 10

## 2021-11-12 MED ORDER — OLANZAPINE 10 MG IM SOLR
10.0000 mg | Freq: Two times a day (BID) | INTRAMUSCULAR | Status: DC | PRN
  Administered 2021-11-12 – 2021-11-14 (×2): 10 mg via INTRAMUSCULAR
  Filled 2021-11-12 (×2): qty 10

## 2021-11-12 MED ORDER — STERILE WATER FOR INJECTION IJ SOLN
INTRAMUSCULAR | Status: AC
  Filled 2021-11-12: qty 10

## 2021-11-12 MED ORDER — OLANZAPINE 10 MG IM SOLR: 5.0000 mg | Freq: Two times a day (BID) | INTRAMUSCULAR | Status: DC | PRN

## 2021-11-12 MED ORDER — OLANZAPINE 5 MG PO TBDP: 10.0000 mg | ORAL_TABLET | Freq: Two times a day (BID) | ORAL | Status: DC | PRN

## 2021-11-12 MED ORDER — CLOZAPINE 25 MG PO TABS
12.5000 mg | ORAL_TABLET | Freq: Two times a day (BID) | ORAL | Status: DC
  Administered 2021-11-12 – 2021-11-13 (×3): 12.5 mg via ORAL
  Filled 2021-11-12 (×7): qty 1
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
## 2021-11-12 NOTE — Progress Notes (Signed)
Nurse wasn't able complete a full assessment pt refused to be touched. Pt did that meds with no issues and c/o of no pain at the time. Bed set to lowest position and call bell in reach will cont to monitor.

## 2021-11-12 NOTE — Progress Notes (Signed)
PHARMACIST - PHYSICIAN ORDER COMMUNICATION  Molly [name] is a 36 y.o. year old female with a history of schizoaffective disorder. Continuing this medication order as an inpatient requires that monitoring parameters per REMS requirements must be met.  Clozapine REMS Dispense Authorization was obtained, and will dispense inpatient.  RDA code [id number].  Verified Clozapine dose: Patient has a history of being on, but not recently. This is a new order from Psych  Last [place] value and date reported on the Clozapine REMS website: 6695, 11/12/2021 [place] monitoring frequency: weekly Next ANC reporting is due on (date) 11/19/2021.  Wynell Balloon 11/12/2021, 10:15 AM
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
## 2021-11-12 NOTE — Progress Notes (Signed)
Pt has been awake since 2200 on 07/12. She is very agitated and yelling at staff. She has refused for me to help assist with anything including getting up from the bed and walking. She has been talking to her self and seeing things that are not there.

## 2021-11-12 NOTE — Progress Notes (Signed)
PT Cancellation Note  Patient Details Name: Molly [name] MRN: [id number] DOB: 1986/04/02   Cancelled Treatment:    Reason Eval/Treat Not Completed: Other (comment) (Pt screaming and agitated on arrival.  Pt appeared to be having halluciinations.  Nurse aware that pt too upset to work with PT.)   [name] 11/12/2021, 1:03 PM Billijo Dilling M,PT Acute Rehab Services [phone]

## 2021-11-12 NOTE — Consult Note (Signed)
Brief Psychiatry Consult Note  Was called by nurse during pt outburst in afternoon asking what to do; pt heard yelling, screaming, very agitated - increased PRN olanzapine to 10 mg. Asked nurse to continue to try to get EKG as we have not had one for almost 2 weeks. NP to see in person later today.   Molly [name]

## 2021-11-12 NOTE — Plan of Care (Signed)
  Problem: Activity: Goal: Risk for activity intolerance will decrease Outcome: Progressing   Problem: Nutrition: Goal: Adequate nutrition will be maintained Outcome: Progressing   Problem: Coping: Goal: Level of anxiety will decrease Outcome: Progressing   Problem: Elimination: Goal: Will not experience complications related to bowel motility Outcome: Progressing Goal: Will not experience complications related to urinary retention Outcome: Progressing   Problem: Elimination: Goal: Will not experience complications related to urinary retention Outcome: Progressing

## 2021-11-12 NOTE — Progress Notes (Signed)
Pt refused evening meds.

## 2021-11-12 NOTE — Progress Notes (Deleted)
Pt has been awake since 2200 on 07/13. She is very agitated and yelling at staff. She has refused for me to help assist with anything including getting up from the bed and walking. She has been talking to her self and seeing things that are not there.

## 2021-11-13 DIAGNOSIS — F201 Disorganized schizophrenia: Secondary | ICD-10-CM | POA: Diagnosis not present

## 2021-11-13 LAB — COMPREHENSIVE METABOLIC PANEL
ALT: 7 U/L (ref 0–44)
AST: 13 U/L — ABNORMAL LOW (ref 15–41)
Albumin: 2.9 g/dL — ABNORMAL LOW (ref 3.5–5.0)
Alkaline Phosphatase: 176 U/L — ABNORMAL HIGH (ref 38–126)
Anion gap: 10 (ref 5–15)
BUN: 7 mg/dL (ref 6–20)
CO2: 23 mmol/L (ref 22–32)
Calcium: 8.6 mg/dL — ABNORMAL LOW (ref 8.9–10.3)
Chloride: 105 mmol/L (ref 98–111)
Creatinine, Ser: 0.65 mg/dL (ref 0.44–1.00)
GFR, Estimated: >60 mL/min (ref >60)
Glucose, Bld: 125 mg/dL — ABNORMAL HIGH (ref 70–99)
Potassium: 3.7 mmol/L (ref 3.5–5.1)
Sodium: 138 mmol/L (ref 135–145)
Total Bilirubin: 0.2 mg/dL — ABNORMAL LOW (ref 0.3–1.2)
Total Protein: 6.6 g/dL (ref 6.5–8.1)

## 2021-11-13 LAB — CBC WITH DIFFERENTIAL/PLATELET
Abs Immature Granulocytes: 0.08 10*3/uL — ABNORMAL HIGH (ref 0.00–0.07)
Basophils Absolute: 0.1 10*3/uL (ref 0.0–0.1)
Basophils Relative: 1 %
Eosinophils Absolute: 0.1 10*3/uL (ref 0.0–0.5)
Eosinophils Relative: 1 %
HCT: 30.9 % — ABNORMAL LOW (ref 36.0–46.0)
Hemoglobin: 9.8 g/dL — ABNORMAL LOW (ref 12.0–15.0)
Immature Granulocytes: 1 %
Lymphocytes Relative: 33 %
Lymphs Abs: 3.8 10*3/uL (ref 0.7–4.0)
MCH: 27.4 pg (ref 26.0–34.0)
MCHC: 31.7 g/dL (ref 30.0–36.0)
MCV: 86.3 fL (ref 80.0–100.0)
Monocytes Absolute: 1 10*3/uL (ref 0.1–1.0)
Monocytes Relative: 9 %
Neutro Abs: 6.5 10*3/uL (ref 1.7–7.7)
Neutrophils Relative %: 55 %
Platelets: 595 10*3/uL — ABNORMAL HIGH (ref 150–400)
RBC: 3.58 MIL/uL — ABNORMAL LOW (ref 3.87–5.11)
RDW: 16.4 % — ABNORMAL HIGH (ref 11.5–15.5)
WBC: 11.5 10*3/uL — ABNORMAL HIGH (ref 4.0–10.5)
nRBC: 0.2 % (ref 0.0–0.2)

## 2021-11-13 LAB — VALPROIC ACID LEVEL: Valproic Acid Lvl: 93 ug/mL (ref 50.0–100.0)

## 2021-11-13 LAB — RPR: RPR Ser Ql: NONREACTIVE

## 2021-11-13 LAB — TROPONIN I (HIGH SENSITIVITY): Troponin I (High Sensitivity): 5 ng/L (ref <18)

## 2021-11-13 MED ORDER — CLOZAPINE 25 MG PO TABS
12.5000 mg | ORAL_TABLET | Freq: Two times a day (BID) | ORAL | Status: AC
  Administered 2021-11-13 – 2021-11-14 (×2): 12.5 mg via ORAL
  Filled 2021-11-13 (×2): qty 1

## 2021-11-13 MED ORDER — CLOZAPINE 25 MG PO TABS
25.0000 mg | ORAL_TABLET | Freq: Two times a day (BID) | ORAL | Status: DC
Start: 2021-11-14 — End: 2021-11-17
  Administered 2021-11-14 – 2021-11-16 (×5): 25 mg via ORAL
  Filled 2021-11-13 (×6): qty 1

## 2021-11-13 MED ORDER — POLYETHYLENE GLYCOL 3350 17 G PO PACK
17.0000 g | PACK | Freq: Every day | ORAL | Status: DC
  Administered 2021-11-13 – 2021-12-02 (×7): 17 g via ORAL
  Filled 2021-11-13 (×16): qty 1

## 2021-11-13 NOTE — Progress Notes (Signed)
Pt has been refusing V/S. Pt also refused Suture removal stated "I want it to be done tomorrow by an Lawanna Kobus not the devil."

## 2021-11-13 NOTE — Progress Notes (Signed)
Pt was very agitated and upset she refused help Psychology new meds were order and IM was v/o see MAR. Pt was then moved from Room 08 to 32. Bed put a lowest and call bell in reach will cont to monitor.

## 2021-11-13 NOTE — Progress Notes (Signed)
Patient is resting at this time. Informed by nurse to let patient sleep. Will try to obtain vital signs at a later time. Sitter present in room.

## 2021-11-13 NOTE — Progress Notes (Signed)
Patient had very agitated night Screaming profanity and asking for food multiple times. Extra Zyprexa 20 mg was given but with little results. She went to sleep at around 0600. Ilean Skill LPN
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
## 2021-11-13 NOTE — Consult Note (Signed)
  Attempted to assess patient x 2, unsuccessful due to patient sleeping.  Patient did awake to take her morning medications, and has been asleep primarily most of today.  Discussion with nurse reports patient is having a much better day today, compared to yesterday.  She states patient is more redirectable, and has not been as disruptive.  She reports patient remains psychotic, however follows commands and gets back in the bed when told.   No additional medication adjustments will be made.  Will continue to titrate Clozaril over the weekend.  Will place order for Clozaril 25 mg p.o. twice daily to start tomorrow afternoon.  Patient will need CBC, troponin, and EKG next week. -Depakote level obtained today appears to be therapeutic, will continue at current dose at this time. -Patient remains on [facility] priority wait list. -Psychiatry will continue to follow at this time.  Patient to be seen daily, by consult service.
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
## 2021-11-14 MED ORDER — OLANZAPINE 5 MG PO TBDP
10.0000 mg | ORAL_TABLET | Freq: Two times a day (BID) | ORAL | Status: DC | PRN
  Administered 2021-11-15 – 2021-11-27 (×3): 10 mg via ORAL
  Filled 2021-11-14 (×5): qty 2

## 2021-11-14 MED ORDER — OLANZAPINE 10 MG IM SOLR
10.0000 mg | Freq: Two times a day (BID) | INTRAMUSCULAR | Status: DC | PRN
  Administered 2021-11-18: 10 mg via INTRAMUSCULAR
  Filled 2021-11-14 (×4): qty 10
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
## 2021-11-14 NOTE — Progress Notes (Signed)
Patient ID: Molly [name], female   DOB: Oct 29, 1985, 36 y.o.   MRN: [id number]  Patient is loud and combative this morning. Patient screaming at sitter and chasing her out into the hallway. Security called. IM Zyprexa administered. MD paged and at bedside. States she cannot have any further Zyprexa IM until night shift. No additional medications ordered. Patients on floor requesting we close her door. Explained we cannot due to safety reasons. Charge nurse aware and no further suggestions given for patient safety. AC Lydia called. No further suggestions for patient safety. Will continue to monitor.  Molly Collum, RN
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
## 2021-11-15 DIAGNOSIS — F201 Disorganized schizophrenia: Secondary | ICD-10-CM | POA: Diagnosis not present

## 2021-11-15 NOTE — Progress Notes (Addendum)
This nurse attempted to give this patient her evening medications and the patient became loud and combative. Patient started screaming at this nurse, the sitter and the charge nurse. Patient also attempting to force her way through the door and started throwing items at the charge nurse. Security was called.   After security arrived, patient agreed to take the rest of her medications. Patient refused to have an assessment done.  Rocky Morel, DO, was made aware of the situation. No new orders placed at this time. Will continue to monitor patient.
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
## 2021-11-15 NOTE — Progress Notes (Signed)
Patient refused to have dressing change done.

## 2021-11-15 NOTE — TOC Progression Note (Signed)
Transition of Care [facility]) - Progression Note    Patient Details  Name: MATTALYN ANDEREGG MRN: [id number] Date of Birth: Oct 16, 1985  [facility] CM/SW Contact  Callia Swim [place], [place] Phone Number: 11/15/2021, 2:52 PM  Clinical Narrative:    Signed custody order received, [facility] contacted to arrange law enforcement to serve patient.  Sheray Grist, [name] Transition of Care     Expected Discharge Plan: Psychiatric Hospital Barriers to Discharge: Continued Medical Work up  Expected Discharge Plan and Services Expected Discharge Plan: Psychiatric Hospital In-house Referral: Clinical Social Work     Living arrangements for the past 2 months: Homeless                                       Social Determinants of Health (SDOH) Interventions    Readmission Risk Interventions     No data to display
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
## 2021-11-15 NOTE — Plan of Care (Signed)

## 2021-11-16 DIAGNOSIS — F201 Disorganized schizophrenia: Secondary | ICD-10-CM | POA: Diagnosis not present

## 2021-11-16 NOTE — Plan of Care (Signed)

## 2021-11-16 NOTE — Plan of Care (Signed)
  Problem: Health Behavior/Discharge Planning: Goal: Ability to manage health-related needs will improve Outcome: Progressing   Problem: Clinical Measurements: Goal: Ability to maintain clinical measurements within normal limits will improve Outcome: Progressing Goal: Will remain free from infection Outcome: Progressing   
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
## 2021-11-16 NOTE — Consult Note (Signed)
  Patient seen and attempted to assess. Patient was observed to be sleeping, easy to arouse however difficult to remain awake. Spoke with sitter who reports patient has been doing "ok" today and sleeping primarily most of the day. SHe has not had any disruptive behaviors, agitation, or aggression. Patient did receive prn medication yesterday, and became combative with nursing staff this weekend.   Psychiatry will continue to follow at this time.  -Will increase Clozaril 37.5mg  po BID.  -Will continue olanzapine 30mg  po daily at this time, in an effort to prevent severe decompensation with cross titration. Will adjust medication after Clozaril is at 50mg  po BID.  -Patient remains at risk for cardiac events due to multiple antipsychotics, recommend telemetry monitoring when patient tolerates and agrees. Last EKG obtained on 07/13 -QTc 473.  -Continue 1:1 safety sitter.  -Continue [facility] referral, recommend daily contact at this time as she remains on the priority list.

## 2021-11-16 NOTE — TOC Progression Note (Signed)
Transition of Care [facility]) - Progression Note    Patient Details  Name: Molly [name] MRN: [id number] Date of Birth: [birth year]  [facility] CM/SW Contact  Lorri Frederick, [name] Phone Number: 11/16/2021, 1:06 PM  Clinical Narrative:   CSW received phone call from [facility] asking if pt still needs placement, CSW confirmed that she does.  [facility] unable to provide update/time frame, only report pt remains on the wait list.    Expected Discharge Plan: Psychiatric Hospital Barriers to Discharge: Continued Medical Work up  Expected Discharge Plan and Services Expected Discharge Plan: Psychiatric Hospital In-house Referral: Clinical Social Work     Living arrangements for the past 2 months: Homeless                                       Social Determinants of Health (SDOH) Interventions    Readmission Risk Interventions     No data to display

## 2021-11-17 MED ORDER — ONDANSETRON HCL 4 MG/2ML IJ SOLN: 4.0000 mg | Freq: Once | INTRAMUSCULAR | Status: DC

## 2021-11-17 MED ORDER — CLOZAPINE 25 MG PO TABS
37.5000 mg | ORAL_TABLET | Freq: Two times a day (BID) | ORAL | Status: DC
  Administered 2021-11-17 – 2021-11-20 (×7): 37.5 mg via ORAL
  Filled 2021-11-17 (×8): qty 2

## 2021-11-17 NOTE — Progress Notes (Signed)
PT Cancellation Note  Patient Details Name: Molly [name] MRN: [id number] DOB: December 15, 1985   Cancelled Treatment:    Reason Eval/Treat Not Completed: Patient declined, no reason specified. Will try again in afternoon per pt's request.   Tana Coast 11/17/2021, 11:15 AM
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
## 2021-11-18 DIAGNOSIS — F201 Disorganized schizophrenia: Secondary | ICD-10-CM | POA: Diagnosis not present

## 2021-11-18 MED ORDER — LIP MEDEX EX OINT
TOPICAL_OINTMENT | CUTANEOUS | Status: DC | PRN
  Filled 2021-11-18 (×2): qty 7

## 2021-11-18 MED ORDER — LUBIPROSTONE 8 MCG PO CAPS
8.0000 ug | ORAL_CAPSULE | Freq: Two times a day (BID) | ORAL | Status: DC
  Administered 2021-11-18 – 2021-12-03 (×29): 8 ug via ORAL
  Filled 2021-11-18 (×30): qty 1

## 2021-11-18 MED ORDER — LUBIPROSTONE 24 MCG PO CAPS: 24.0000 ug | ORAL_CAPSULE | Freq: Two times a day (BID) | ORAL | Status: DC

## 2021-11-18 NOTE — Progress Notes (Signed)
OT Cancellation Note  Patient Details Name: CHAUNICE OBIE MRN: [id number] DOB: Dec 10, 1985   Cancelled Treatment:    Reason Eval/Treat Not Completed: Fatigue/lethargy limiting ability to participate Pt in deep sleep on entry. Will follow up pending improvements in lethargy, likely tomorrow.  Lorre Munroe 11/18/2021, 2:07 PM
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
## 2021-11-18 NOTE — Plan of Care (Signed)

## 2021-11-18 NOTE — Progress Notes (Signed)
PT Cancellation Note  Patient Details Name: Molly [name] MRN: [id number] DOB: Mar 07, 1986   Cancelled Treatment:    Reason Eval/Treat Not Completed: Other (comment) (Pt would not respond to PT or Rehab tech.  Discussed with nurse who states that pt had medications and may be too lethargic. Will return as able.)   [name] 11/18/2021, 12:04 PM [name] M,PT Acute Rehab Services [phone]

## 2021-11-18 NOTE — Consult Note (Signed)
  Psychiatry continues to follow this patient on a daily basis.  Patient currently on 2 antipsychotics due to severe decompensation of schizophrenia and because patient has not been able to achieve symptom control on the antipsychotic monotherapy, she is currently receiving separate antipscyhotic medications Clozaril and Olanzapine. It will benefit patient to continue this combination therapy as recommended. However, as symptoms continue to improve, patient may be titrated to monotherapy (Clozaril ) at the discretion and proper judgement of this inpatient provider.     Patient does appear to be responding to dual therapy as evidenced by reduction in as needed medication, outbursts, delusions, auditory hallucinations, and psychosis. Marland Kitchen   Psychiatry will continue to follow at this time.  -Will continue Clozaril 37.5mg  po BID. Will obtain weekly labs tomorrow at 5 AM. -Will continue olanzapine 30mg  po daily at this time, in an effort to prevent severe decompensation with cross titration. Will adjust medication after Clozaril is at 50mg  po BID.  -Patient remains at risk for cardiac events due to multiple antipsychotics, recommend telemetry monitoring when patient tolerates and agrees. Last EKG obtained on 07/13 -QTc 473.  Will order EKG for tomorrow. -Encourage nursing to document bowel movements, as patient is at risk for constipation. Patient has been declining Miralax, will start Amitiza po BID at this time. Patient at very high risk for developing  Clozaril induced constipation/ileus.  -Continue 1:1 safety sitter.  -Continue [facility] referral, recommend daily contact at this time as she remains on the priority list.

## 2021-11-19 DIAGNOSIS — F201 Disorganized schizophrenia: Secondary | ICD-10-CM | POA: Diagnosis not present

## 2021-11-19 NOTE — TOC Progression Note (Signed)
Transition of Care [facility]) - Progression Note    Patient Details  Name: Molly [name] MRN: [id number] Date of Birth: [birth year]  Transition of [facility]) CM/SW Contact  Lorri Frederick, [name] Phone Number: 11/19/2021, 2:07 PM  Clinical Narrative:   Phone call from [facility] confirming pt still needs bed, CSW confirmed. No update on time frame.     Expected Discharge Plan: Psychiatric Hospital Barriers to Discharge: Continued Medical Work up  Expected Discharge Plan and Services Expected Discharge Plan: Psychiatric Hospital In-house Referral: Clinical Social Work     Living arrangements for the past 2 months: Homeless                                       Social Determinants of Health (SDOH) Interventions    Readmission Risk Interventions     No data to display
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
## 2021-11-19 NOTE — Progress Notes (Signed)
PT Cancellation Note  Patient Details Name: Molly [name] MRN: [id number] DOB: 02-13-1986   Cancelled Treatment:    Reason Eval/Treat Not Completed: Patient declined, no reason specified;Fatigue/lethargy limiting ability to participate. Pt unable to keep her eyes open and not willing to participate in OOB mobility. Attempted bed exercises but pt with poor effort.   Tana Coast 11/19/2021, 11:18 AM

## 2021-11-19 NOTE — Progress Notes (Addendum)
Patient has refused her vitals and refusal of divalproex. Stated that it is too many pills to take.   Patient agreed to take medication.

## 2021-11-19 NOTE — Progress Notes (Signed)
R leg dressings changed.

## 2021-11-19 NOTE — Progress Notes (Signed)
Pt stated willing to take all meds but depakote. Pt took all meds but depakote. MD paged.

## 2021-11-19 NOTE — Plan of Care (Signed)

## 2021-11-19 NOTE — Progress Notes (Signed)
Attempted to give medicine to pt. Pt refused. Attempted to change dressings on leg. Pt refused.
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
## 2021-11-20 MED ORDER — CLOZAPINE 25 MG PO TABS
50.0000 mg | ORAL_TABLET | Freq: Two times a day (BID) | ORAL | Status: DC
  Administered 2021-11-20 – 2021-11-29 (×18): 50 mg via ORAL
  Filled 2021-11-20 (×18): qty 2

## 2021-11-20 NOTE — Plan of Care (Signed)
  Problem: Education: Goal: Knowledge of General Education information will improve Description: Including pain rating scale, medication(s)/side effects and non-pharmacologic comfort measures Outcome: Progressing   Problem: Health Behavior/Discharge Planning: Goal: Ability to manage health-related needs will improve Outcome: Progressing   Problem: Clinical Measurements: Goal: Will remain free from infection Outcome: Progressing   
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
## 2021-11-20 NOTE — Progress Notes (Signed)
OT Cancellation Note  Patient Details Name: Molly [name] MRN: [id number] DOB: [birth year]   Cancelled Treatment:    Reason Eval/Treat Not Completed: Fatigue/lethargy limiting ability to participate Pt sleeping on entry, awakens to voice but would quickly fall back asleep. Unable to participate this afternoon. Will follow up as schedule permits.  Lorre Munroe 11/20/2021, 1:50 PM
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
## 2021-11-21 DIAGNOSIS — F201 Disorganized schizophrenia: Secondary | ICD-10-CM | POA: Diagnosis not present

## 2021-11-21 LAB — CBC WITH DIFFERENTIAL/PLATELET
Abs Immature Granulocytes: 0.11 10*3/uL — ABNORMAL HIGH (ref 0.00–0.07)
Basophils Absolute: 0.1 10*3/uL (ref 0.0–0.1)
Basophils Relative: 1 %
Eosinophils Absolute: 0.2 10*3/uL (ref 0.0–0.5)
Eosinophils Relative: 2 %
HCT: 32 % — ABNORMAL LOW (ref 36.0–46.0)
Hemoglobin: 10.1 g/dL — ABNORMAL LOW (ref 12.0–15.0)
Immature Granulocytes: 1 %
Lymphocytes Relative: 31 %
Lymphs Abs: 3.3 10*3/uL (ref 0.7–4.0)
MCH: 27.3 pg (ref 26.0–34.0)
MCHC: 31.6 g/dL (ref 30.0–36.0)
MCV: 86.5 fL (ref 80.0–100.0)
Monocytes Absolute: 1.3 10*3/uL — ABNORMAL HIGH (ref 0.1–1.0)
Monocytes Relative: 12 %
Neutro Abs: 5.9 10*3/uL (ref 1.7–7.7)
Neutrophils Relative %: 53 %
Platelets: 436 10*3/uL — ABNORMAL HIGH (ref 150–400)
RBC: 3.7 MIL/uL — ABNORMAL LOW (ref 3.87–5.11)
RDW: 16.4 % — ABNORMAL HIGH (ref 11.5–15.5)
WBC: 10.8 10*3/uL — ABNORMAL HIGH (ref 4.0–10.5)
nRBC: 0 % (ref 0.0–0.2)

## 2021-11-21 LAB — COMPREHENSIVE METABOLIC PANEL
ALT: 7 U/L (ref 0–44)
AST: 16 U/L (ref 15–41)
Albumin: 2.7 g/dL — ABNORMAL LOW (ref 3.5–5.0)
Alkaline Phosphatase: 153 U/L — ABNORMAL HIGH (ref 38–126)
Anion gap: 7 (ref 5–15)
BUN: 8 mg/dL (ref 6–20)
CO2: 27 mmol/L (ref 22–32)
Calcium: 8.4 mg/dL — ABNORMAL LOW (ref 8.9–10.3)
Chloride: 102 mmol/L (ref 98–111)
Creatinine, Ser: 0.59 mg/dL (ref 0.44–1.00)
GFR, Estimated: >60 mL/min (ref >60)
Glucose, Bld: 102 mg/dL — ABNORMAL HIGH (ref 70–99)
Potassium: 4.1 mmol/L (ref 3.5–5.1)
Sodium: 136 mmol/L (ref 135–145)
Total Bilirubin: 0.3 mg/dL (ref 0.3–1.2)
Total Protein: 6.8 g/dL (ref 6.5–8.1)

## 2021-11-21 NOTE — Consult Note (Signed)
[facility] Face-to-Face Psychiatry Consult   Reason for Consult: psychosis, disorganized thought process Referring Physician:  Tyson Alias, MD Patient Identification: Molly [name] MRN:  [id number] Principal Diagnosis: Disorganized schizophrenia [facility]) Diagnosis:  Principal Problem:   Disorganized schizophrenia (HCC) Active Problems:   Tibia/fibula fracture, right, closed, initial encounter   Total Time spent with patient: 30 minutes  Subjective: '' disorganized thoughts process.''  Objective: Patient seen and evaluated this morning, her chart was reviewed and treatment remains unchanged, as patient still meet criteria for psychiatric admission to [facility]. She is alert, awake but partially cooperative/compliant with treatment. She needs prompting to comply with treatment as she does not fully understand the extent of her mental and medical issues. She has become less aggressive, agitated but her thought process and behavior remains disorganized. She smile, talks to herself inappropriately and often act as if she is responding to internal stimuli. Today, she denies SI/HI but her behavior could be unpredictable.  Lab reviewed CBC with diff due to patient being on Clozapine: WBC-10.8,  ANC-5.9  Past Psychiatric History: as above  Risk to Self:  denies Risk to Others:  denies Prior Inpatient Therapy:  multiple in the past Prior Outpatient Therapy:  non -fully compliant  Past Medical History:  Past Medical History:  Diagnosis Date   Abnormal Pap smear    ASC-cannot exclude HGSIL on Pap 02/15/2012   ASC-US on 02/03/2012 pap (associated Trichomonas infection). No reflex HPV testing performed on specimen.  Patient informed that she will need repeat Pap in one year.       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 Dennie Bible (Psych) At [facility] in  [place], [place]. 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   Qualifier: Diagnosis of  By: McDiarmid MD, Benjaman Pott USER 02/08/2009   Qualifier: Diagnosis of  By: Knox Royalty      Past Surgical History:  Procedure Laterality Date   INCISION AND DRAINAGE     pilanodal cyst   TIBIA IM NAIL INSERTION Right 10/26/2021   Procedure: INTRAMEDULLARY (IM) NAIL TIBIAL;  Surgeon: Myrene Galas, MD;  Location: [facility] OR;  Service: Orthopedics;  Laterality: Right;   TOOTH EXTRACTION N/A 10/07/2017   Procedure: EXTRACTION TEETH NUMBERS ONE, SEVENTEEN, NINETEEN AND THIRTY TWO;  Surgeon: Ocie Doyne, DDS;  Location: [facility] OR;  Service: Oral Surgery;  Laterality: N/A;   Family History:  Family History  Adopted: Yes  Problem Relation Age of Onset   Bipolar disorder Sister    Alcohol abuse Brother    Cancer Father    Diabetes Mother    Family Psychiatric  History:  Social History:  Social History   Substance and Sexual Activity  Alcohol Use No   Comment: occ     Social History   Substance and Sexual Activity  Drug Use No  Social History   Socioeconomic History   Marital status: Single    Spouse name: Not on file   Number of children: Not on file   Years of education: Not on file   Highest education level: Not on file  Occupational History   Not on file  Tobacco Use   Smoking status: Every Day    Packs/day: 2.00    Years: 10.00    Total pack years: 20.00    Types: Cigarettes   Smokeless tobacco: Never  Vaping Use   Vaping Use: Never used  Substance and Sexual Activity    Alcohol use: No    Comment: occ   Drug use: No   Sexual activity: Yes    Birth control/protection: Injection  Other Topics Concern   Not on file  Social History Narrative   Adopted   Has Guardian   Living in [place] home with Bradly Chris   Transportation: Bus   Social Determinants of Health   Financial Resource Strain: Not on file  Food Insecurity: Not on file  Transportation Needs: Not on file  Physical Activity: Not on file  Stress: Not on file  Social Connections: Not on file   Additional Social History:    Allergies:   Allergies  Allergen Reactions   Abilify [Aripiprazole] Other (See Comments)    Thinks it's nasty- does not want it.  Injection is ok.      Labs:  Results for orders placed or performed during the hospital encounter of 10/25/21 (from the past 48 hour(s))  CBC with Differential/Platelet     Status: Abnormal   Collection Time: 11/21/21  6:17 AM  Result Value Ref Range   WBC 10.8 (H) 4.0 - 10.5 K/uL   RBC 3.70 (L) 3.87 - 5.11 MIL/uL   Hemoglobin 10.1 (L) 12.0 - 15.0 g/dL   HCT 49.7 (L) 02.6 - 37.8 %   MCV 86.5 80.0 - 100.0 fL   MCH 27.3 26.0 - 34.0 pg   MCHC 31.6 30.0 - 36.0 g/dL   RDW 58.8 (H) 50.2 - 77.4 %   Platelets 436 (H) 150 - 400 K/uL   nRBC 0.0 0.0 - 0.2 %   Neutrophils Relative % 53 %   Neutro Abs 5.9 1.7 - 7.7 K/uL   Lymphocytes Relative 31 %   Lymphs Abs 3.3 0.7 - 4.0 K/uL   Monocytes Relative 12 %   Monocytes Absolute 1.3 (H) 0.1 - 1.0 K/uL   Eosinophils Relative 2 %   Eosinophils Absolute 0.2 0.0 - 0.5 K/uL   Basophils Relative 1 %   Basophils Absolute 0.1 0.0 - 0.1 K/uL   Immature Granulocytes 1 %   Abs Immature Granulocytes 0.11 (H) 0.00 - 0.07 K/uL    Comment: Performed at [name], [address], [place], [place] [address]  Comprehensive metabolic panel     Status: Abnormal   Collection Time: 11/21/21  6:17 AM  Result Value Ref Range   Sodium 136 135 - 145 mmol/L   Potassium 4.1 3.5 - 5.1 mmol/L   Chloride  102 98 - 111 mmol/L   CO2 27 22 - 32 mmol/L   Glucose, Bld 102 (H) 70 - 99 mg/dL    Comment: Glucose reference range applies only to samples taken after fasting for at least 8 hours.   BUN 8 6 - 20 mg/dL   Creatinine, Ser 6.76 0.44 - 1.00 mg/dL   Calcium 8.4 (L) 8.9 - 10.3 mg/dL   Total Protein 6.8  6.5 - 8.1 g/dL   Albumin 2.7 (L) 3.5 - 5.0 g/dL   AST 16 15 - 41 U/L   ALT 7 0 - 44 U/L   Alkaline Phosphatase 153 (H) 38 - 126 U/L   Total Bilirubin 0.3 0.3 - 1.2 mg/dL   GFR, Estimated >16 >10 mL/min    Comment: (NOTE) Calculated using the CKD-EPI Creatinine Equation (2021)    Anion gap 7 5 - 15    Comment: Performed at [facility], [address] [place], [place] [address]    Current Facility-Administered Medications  Medication Dose Route Frequency Provider Last Rate Last Admin   acetaminophen (TYLENOL) tablet 1,000 mg  1,000 mg Oral Q8H Montez Morita, PA-C   1,000 mg at 11/21/21 0630   Or   acetaminophen (TYLENOL) suppository 650 mg  650 mg Rectal Q8H Montez Morita, PA-C       ascorbic acid (VITAMIN C) tablet 500 mg  500 mg Oral Daily Montez Morita, PA-C   500 mg at 11/20/21 4098   clonazePAM (KLONOPIN) tablet 0.5 mg  0.5 mg Oral BID Maryagnes Amos, FNP   0.5 mg at 11/20/21 2106   cloZAPine (CLOZARIL) tablet 50 mg  50 mg Oral BID Oneta Rack, NP   50 mg at 11/20/21 2106   diphenhydrAMINE (BENADRYL) injection 50 mg  50 mg Intravenous Once PRN Champ Mungo, DO       divalproex (DEPAKOTE SPRINKLE) capsule 1,250 mg  1,250 mg Oral Q12H Debe Coder B, MD   1,250 mg at 11/20/21 2107   docusate sodium (COLACE) capsule 100 mg  100 mg Oral Daily Maryagnes Amos, FNP   100 mg at 11/20/21 1191   lactated ringers infusion   Intravenous Continuous Champ Mungo, DO   Stopped at 10/28/21 1530   lip balm (CARMEX) ointment   Topical PRN Tyson Alias, MD       lubiprostone (AMITIZA) capsule 8 mcg  8 mcg Oral BID WC Maryagnes Amos, FNP   8 mcg at 11/20/21 1653    OLANZapine zydis (ZYPREXA) disintegrating tablet 10 mg  10 mg Oral BID PRN Thedore Mins, MD   10 mg at 11/15/21 1613   And   OLANZapine (ZYPREXA) injection 10 mg  10 mg Intramuscular BID PRN Thedore Mins, MD   10 mg at 11/18/21 1900   OLANZapine (ZYPREXA) tablet 10 mg  10 mg Oral Daily Maryagnes Amos, FNP   10 mg at 11/20/21 0849   And   OLANZapine (ZYPREXA) tablet 20 mg  20 mg Oral QHS Maryagnes Amos, FNP   20 mg at 11/20/21 2105   ondansetron (ZOFRAN) injection 4 mg  4 mg Intravenous Once Adron Bene, MD       ondansetron (ZOFRAN-ODT) disintegrating tablet 4 mg  4 mg Oral BID PRN Champ Mungo, DO   4 mg at 11/18/21 2140   polyethylene glycol (MIRALAX / GLYCOLAX) packet 17 g  17 g Oral Daily PRN Starkes-Perry, Juel Burrow, FNP       polyethylene glycol (MIRALAX / GLYCOLAX) packet 17 g  17 g Oral Daily Cinderella, Margaret A   17 g at 11/13/21 1300   potassium chloride SA (KLOR-CON M) CR tablet 20 mEq  20 mEq Oral Once Montez Morita, PA-C       rivaroxaban Carlena Hurl) tablet 10 mg  10 mg Oral Q supper Montez Morita, PA-C   10 mg at 11/20/21 1653    Musculoskeletal:not tested    Psychiatric Specialty  Exam:  Presentation  General Appearance: Disheveled  Eye Contact:Fleeting  Speech:Blocked; Clear and Coherent; Pressured  Speech Volume:Increased  Handedness:Right   Mood and Affect  Mood:Anxious; Euphoric; Irritable  Affect:Blunt; Non-Congruent; Inappropriate; Full Range   Thought Process  Thought Processes:Irrevelant; Coherent  Descriptions of Associations:Tangential  Orientation:Full (Time, Place and Person)  Thought Content:Delusions; Paranoid Ideation; Perseveration  History of Schizophrenia/Schizoaffective disorder:Yes  Duration of Psychotic Symptoms:Greater than six months  Hallucinations: talking to herself Ideas of Reference:Delusions; Paranoia; Percusatory  Suicidal Thoughts:No data recorded Homicidal Thoughts:No data  recorded  Sensorium  Memory:Immediate Poor; Recent Poor; Remote Poor  Judgment:Impaired  Insight:Lacking   Executive Functions  Concentration:Poor  Attention Span:Poor  Recall:Poor  [employer] of Knowledge:Poor  Language:Poor   Psychomotor Activity  Psychomotor Activity:increased  Assets  Assets:Resilience; Social Support; Financial Resources/Insurance   Sleep  Sleep:fair  Physical Exam: Physical Exam Vitals reviewed.  Neurological:     Mental Status: She is alert.  Psychiatric:        Mood and Affect: Mood normal.        Behavior: Behavior normal.    Review of Systems  Constitutional: Negative.   Skin: Negative.   Neurological:  Negative for dizziness.  Psychiatric/Behavioral:  Positive for depression and hallucinations. Negative for suicidal ideas. The patient is nervous/anxious.   All other systems reviewed and are negative.  Blood pressure (!) 130/95, pulse 100, temperature 97.7 F (36.5 C), temperature source Oral, resp. rate 17, height 5\' 8"  (1.727 m), weight 124 kg, SpO2 99 %, unknown if currently breastfeeding. Body mass index is 41.57 kg/m.  Treatment Plan Summary: Daily contact with patient to assess and evaluate symptoms and progress in treatment and Medication management  Plan/Recommendations: -Continue  Clozapine 50 mg twice daily- WBC-10.8, ANC-5.9 (11/21/2021) -Cross tapering olanzapine with  Clozapine -Continue to monitor EKG for QTc prolongation- orders placed- pending results -Valproic acid level 93 on 7/14 -Continue 1:1 safety sitter -CSW is seeking [facility] referral for priority admission  Disposition: Recommend psychiatric Inpatient admission when medically cleared.  8/14, MD 11/21/2021 11:55 AM

## 2021-11-21 NOTE — Progress Notes (Signed)
Patient slept on and off throughout the day. Patient refusing vitals, bath and linen change. RN at bedside to give medications at scheduled times, patient either sleeping and did not want to be bother or refused at the moment. Around 1400 patient called out for medications. All AM and noon meds given. Patient returned back to sleep. Drsg c/d/I. Sitter at bedside.  Sitter informed to continue to offer bath and linen change anytime patient is awake. Meals ordered for patient.
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
## 2021-11-21 NOTE — Progress Notes (Addendum)
During dressing change, this RN noted that pt's wounds are not healing properly. Abrasion on pt right knee that has stiches in place looks swollen, has small amount of drainage and odor. Another above knee abrasion ( left, medial), also has drainage, odor and swelling. Dressing in place, but pt frequently rubbing areas with wounds. Discussed with Burnice Logan, MD, VS stable, no acute distress. MD will notify day team.

## 2021-11-22 MED ORDER — ORAL CARE MOUTH RINSE: 15.0000 mL | OROMUCOSAL | Status: DC | PRN

## 2021-11-22 NOTE — TOC Progression Note (Signed)
Transition of Care [facility]) - Progression Note    Patient Details  Name: Molly [name] MRN: [id number] Date of Birth: 12-15-1985  Transition of Care [facility]) CM/SW Contact  Donnalee Curry, LCSWA Phone Number: 11/22/2021, 3:22 PM  Clinical Narrative:     IVC renewed, commitment orders received. [employer] called to serve. Copies x3 left at nursing station.  Expected Discharge Plan: Psychiatric Hospital Barriers to Discharge: Continued Medical Work up  Expected Discharge Plan and Services Expected Discharge Plan: Psychiatric Hospital In-house Referral: Clinical Social Work     Living arrangements for the past 2 months: Homeless                                       Social Determinants of Health (SDOH) Interventions    Readmission Risk Interventions     No data to display

## 2021-11-22 NOTE — Progress Notes (Signed)
Pt refused VS this morning, she refused bath, and her morning dose of tylenol.

## 2021-11-22 NOTE — Plan of Care (Signed)
  Problem: Clinical Measurements: Goal: Respiratory complications will improve Outcome: Progressing Goal: Cardiovascular complication will be avoided Outcome: Progressing   Problem: Nutrition: Goal: Adequate nutrition will be maintained Outcome: Progressing   Problem: Elimination: Goal: Will not experience complications related to bowel motility Outcome: Progressing Goal: Will not experience complications related to urinary retention Outcome: Progressing   Problem: Pain Managment: Goal: General experience of comfort will improve Outcome: Progressing   Problem: Safety: Goal: Ability to remain free from injury will improve Outcome: Progressing   
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
## 2021-11-22 NOTE — Consult Note (Signed)
[facility] Face-to-Face Psychiatry Consult   Reason for Consult: psychosis, disorganized thought process Referring Physician:  Tyson Alias, MD Patient Identification: Molly [name] MRN:  [id number] Principal Diagnosis: Disorganized schizophrenia [facility]) Diagnosis:  Principal Problem:   Disorganized schizophrenia (HCC) Active Problems:   Tibia/fibula fracture, right, closed, initial encounter   Total Time spent with patient: 30 minutes  Complaint: '' defiant, oppositional.''  Subjective/Objective: Patient seen face to face in her hospital room. She is partially cooperative with assessment. Chart review revealed that patient has been refusing vitals, bath and linen change. Also, patient reportedly stay awake at night and sleeps most of the day. She  continues to require prompting to comply with treatment as she does not fully understand the extent of her mental and medical issues. However, patient has become less irritable, aggressive, agitated, impulsive  but her thought process and behavior remains disorganized. She denies SI/HI but her behavior could be unpredictable. As a result, patient still meet criteria for psychiatric admission to [facility].  Lab reviewed CBC with diff due to patient being on Clozapine: WBC-10.8,  ANC-5.9  Past Psychiatric History: as above  Risk to Self:  denies Risk to Others:  denies Prior Inpatient Therapy:  multiple in the past Prior Outpatient Therapy:  non -fully compliant  Past Medical History:  Past Medical History:  Diagnosis Date   Abnormal Pap smear    ASC-cannot exclude HGSIL on Pap 02/15/2012   ASC-US on 02/03/2012 pap (associated Trichomonas infection). No reflex HPV testing performed on specimen.  Patient informed that she will need repeat Pap in one year.       Asthma    ATTENTION DEFICIT, W/O HYPERACTIVITY, History of 06/30/2006   Qualifier: History of  By: McDiarmid MD, Jae Dire, GONOCOCCAL, History of 01/09/2007   Qualifier: History of   By: McDiarmid MD, Charlann Boxer ACUMINATA, HISTORY OF 05/12/2009   Qualifier: History of  By: McDiarmid MD, Todd     Depression    Diabetes mellitus    diet controlled   Eczema    Hypertension    Overactive bladder    Schizophrenia (HCC)    SCHIZOPHRENIA, CATATONIC, HISTORY OF 12/13/2006   Annotation: Diagnoses by  Dr. Dennie Bible (Psych) At [facility] in  [place], [place]. 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   Qualifier: Diagnosis of  By: McDiarmid MD, Benjaman Pott USER 02/08/2009   Qualifier: Diagnosis of  By: Knox Royalty      Past Surgical History:  Procedure Laterality Date   INCISION AND DRAINAGE     pilanodal cyst   TIBIA IM NAIL INSERTION Right 10/26/2021   Procedure: INTRAMEDULLARY (IM) NAIL TIBIAL;  Surgeon: Myrene Galas, MD;  Location: [facility] OR;  Service: Orthopedics;  Laterality: Right;   TOOTH EXTRACTION N/A 10/07/2017   Procedure: EXTRACTION TEETH NUMBERS ONE, SEVENTEEN, NINETEEN AND THIRTY TWO;  Surgeon: Ocie Doyne, DDS;  Location: [facility] OR;  Service: Oral Surgery;  Laterality: N/A;   Family History:  Family History  Adopted: Yes  Problem Relation Age of Onset   Bipolar disorder Sister    Alcohol abuse Brother    Cancer Father    Diabetes Mother    Family Psychiatric  History:  Social History:  Social History   Substance and Sexual Activity  Alcohol Use No   Comment: occ     Social History   Substance and  Sexual Activity  Drug Use No    Social History   Socioeconomic History   Marital status: Single    Spouse name: Not on file   Number of children: Not on file   Years of education: Not on file   Highest education level: Not on file  Occupational History   Not on file  Tobacco Use   Smoking status: Every Day    Packs/day: 2.00    Years: 10.00    Total pack years: 20.00    Types: Cigarettes   Smokeless tobacco: Never  Vaping Use   Vaping Use: Never used  Substance  and Sexual Activity   Alcohol use: No    Comment: occ   Drug use: No   Sexual activity: Yes    Birth control/protection: Injection  Other Topics Concern   Not on file  Social History Narrative   Adopted   Has Guardian   Living in [place] home with [name]   Transportation: Bus   Social Determinants of Health   Financial Resource Strain: Not on file  Food Insecurity: Not on file  Transportation Needs: Not on file  Physical Activity: Not on file  Stress: Not on file  Social Connections: Not on file   Additional Social History:    Allergies:   Allergies  Allergen Reactions   Abilify [Aripiprazole] Other (See Comments)    Thinks it's nasty- does not want it.  Injection is ok.      Labs:  Results for orders placed or performed during the hospital encounter of 10/25/21 (from the past 48 hour(s))  CBC with Differential/Platelet     Status: Abnormal   Collection Time: 11/21/21  6:17 AM  Result Value Ref Range   WBC 10.8 (H) 4.0 - 10.5 K/uL   RBC 3.70 (L) 3.87 - 5.11 MIL/uL   Hemoglobin 10.1 (L) 12.0 - 15.0 g/dL   HCT 32.0 (L) 36.0 - 46.0 %   MCV 86.5 80.0 - 100.0 fL   MCH 27.3 26.0 - 34.0 pg   MCHC 31.6 30.0 - 36.0 g/dL   RDW 16.4 (H) 11.5 - 15.5 %   Platelets 436 (H) 150 - 400 K/uL   nRBC 0.0 0.0 - 0.2 %   Neutrophils Relative % 53 %   Neutro Abs 5.9 1.7 - 7.7 K/uL   Lymphocytes Relative 31 %   Lymphs Abs 3.3 0.7 - 4.0 K/uL   Monocytes Relative 12 %   Monocytes Absolute 1.3 (H) 0.1 - 1.0 K/uL   Eosinophils Relative 2 %   Eosinophils Absolute 0.2 0.0 - 0.5 K/uL   Basophils Relative 1 %   Basophils Absolute 0.1 0.0 - 0.1 K/uL   Immature Granulocytes 1 %   Abs Immature Granulocytes 0.11 (H) 0.00 - 0.07 K/uL    Comment: Performed at [facility], [address] [place], [place] [address]  Comprehensive metabolic panel     Status: Abnormal   Collection Time: 11/21/21  6:17 AM  Result Value Ref Range   Sodium 136 135 - 145 mmol/L   Potassium 4.1 3.5 -  5.1 mmol/L   Chloride 102 98 - 111 mmol/L   CO2 27 22 - 32 mmol/L   Glucose, Bld 102 (H) 70 - 99 mg/dL    Comment: Glucose reference range applies only to samples taken after fasting for at least 8 hours.   BUN 8 6 - 20 mg/dL   Creatinine, Ser 0.59 0.44 - 1.00 mg/dL   Calcium 8.4 (L)  8.9 - 10.3 mg/dL   Total Protein 6.8 6.5 - 8.1 g/dL   Albumin 2.7 (L) 3.5 - 5.0 g/dL   AST 16 15 - 41 U/L   ALT 7 0 - 44 U/L   Alkaline Phosphatase 153 (H) 38 - 126 U/L   Total Bilirubin 0.3 0.3 - 1.2 mg/dL   GFR, Estimated >57 >32 mL/min    Comment: (NOTE) Calculated using the CKD-EPI Creatinine Equation (2021)    Anion gap 7 5 - 15    Comment: Performed at [facility], [address], [place], [place] [address]    Current Facility-Administered Medications  Medication Dose Route Frequency Provider Last Rate Last Admin   acetaminophen (TYLENOL) tablet 1,000 mg  1,000 mg Oral Q8H Montez Morita, PA-C   1,000 mg at 11/22/21 0930   Or   acetaminophen (TYLENOL) suppository 650 mg  650 mg Rectal Q8H Montez Morita, PA-C       ascorbic acid (VITAMIN C) tablet 500 mg  500 mg Oral Daily Montez Morita, PA-C   500 mg at 11/22/21 0930   clonazePAM (KLONOPIN) tablet 0.5 mg  0.5 mg Oral BID Maryagnes Amos, FNP   0.5 mg at 11/22/21 0931   cloZAPine (CLOZARIL) tablet 50 mg  50 mg Oral BID Oneta Rack, NP   50 mg at 11/22/21 0931   diphenhydrAMINE (BENADRYL) injection 50 mg  50 mg Intravenous Once PRN Champ Mungo, DO       divalproex (DEPAKOTE SPRINKLE) capsule 1,250 mg  1,250 mg Oral Q12H Debe Coder B, MD   1,250 mg at 11/22/21 0932   docusate sodium (COLACE) capsule 100 mg  100 mg Oral Daily Maryagnes Amos, FNP   100 mg at 11/22/21 0930   lip balm (CARMEX) ointment   Topical PRN Tyson Alias, MD       lubiprostone (AMITIZA) capsule 8 mcg  8 mcg Oral BID WC Maryagnes Amos, FNP   8 mcg at 11/22/21 0931   OLANZapine zydis (ZYPREXA) disintegrating tablet 10 mg  10 mg Oral BID PRN  Thedore Mins, MD   10 mg at 11/15/21 1613   And   OLANZapine (ZYPREXA) injection 10 mg  10 mg Intramuscular BID PRN Thedore Mins, MD   10 mg at 11/18/21 1900   OLANZapine (ZYPREXA) tablet 10 mg  10 mg Oral Daily Maryagnes Amos, FNP   10 mg at 11/22/21 0930   And   OLANZapine (ZYPREXA) tablet 20 mg  20 mg Oral QHS Maryagnes Amos, FNP   20 mg at 11/21/21 2120   ondansetron (ZOFRAN) injection 4 mg  4 mg Intravenous Once Adron Bene, MD       ondansetron (ZOFRAN-ODT) disintegrating tablet 4 mg  4 mg Oral BID PRN Champ Mungo, DO   4 mg at 11/18/21 2140   polyethylene glycol (MIRALAX / GLYCOLAX) packet 17 g  17 g Oral Daily PRN Starkes-Perry, Juel Burrow, FNP       polyethylene glycol (MIRALAX / GLYCOLAX) packet 17 g  17 g Oral Daily Cinderella, Margaret A   17 g at 11/22/21 0931   potassium chloride SA (KLOR-CON M) CR tablet 20 mEq  20 mEq Oral Once Montez Morita, PA-C       rivaroxaban Carlena Hurl) tablet 10 mg  10 mg Oral Q supper Montez Morita, PA-C   10 mg at 11/21/21 2121    Musculoskeletal:not tested    Psychiatric Specialty Exam:  Presentation  General Appearance: Disheveled  Eye  Contact:Fleeting  Speech:Blocked; Clear and Coherent; Pressured  Speech Volume:Increased  Handedness:Right   Mood and Affect  Mood:Anxious; Euphoric; Irritable  Affect:Blunt; Non-Congruent; Inappropriate; Full Range   Thought Process  Thought Processes:Irrevelant; Coherent  Descriptions of Associations:Tangential  Orientation:Full (Time, Place and Person)  Thought Content:Delusions; Paranoid Ideation; Perseveration  History of Schizophrenia/Schizoaffective disorder:Yes  Duration of Psychotic Symptoms:Greater than six months  Hallucinations: talking to herself Ideas of Reference:Delusions; Paranoia; Percusatory  Suicidal Thoughts:No data recorded Homicidal Thoughts:No data recorded  Sensorium  Memory:Immediate Poor; Recent Poor; Remote  Poor  Judgment:Impaired  Insight:Lacking   Executive Functions  Concentration:Poor  Attention Span:Poor  Recall:Poor  [employer] of Knowledge:Poor  Language:Poor   Psychomotor Activity  Psychomotor Activity:increased  Assets  Assets:Resilience; Social Support; Financial Resources/Insurance   Sleep  Sleep:fair  Physical Exam: Physical Exam Vitals reviewed.  Neurological:     Mental Status: She is alert.  Psychiatric:        Mood and Affect: Mood normal.        Behavior: Behavior normal.    Review of Systems  Constitutional: Negative.   Skin: Negative.   Neurological:  Negative for dizziness.  Psychiatric/Behavioral:  Positive for depression and hallucinations. Negative for suicidal ideas. The patient is nervous/anxious.   All other systems reviewed and are negative.  Blood pressure 129/80, pulse (!) 109, temperature 98.8 F (37.1 C), temperature source Oral, resp. rate 18, height 5\' 8"  (1.727 m), weight 124 kg, SpO2 100 %, unknown if currently breastfeeding. Body mass index is 41.57 kg/m.  Treatment Plan Summary: Daily contact with patient to assess and evaluate symptoms and progress in treatment and Medication management  Plan/Recommendations: -Continue  Clozapine 50 mg twice daily- WBC-10.8, ANC-5.9 (11/21/2021) -Need CBC with diff weekly-pt on Clozapine, next lab is 11/28/21 -Cross tapering olanzapine with  Clozapine -Continue to monitor EKG for QTc prolongation- orders placed- pending results -Valproic acid level 93 on 7/14 -Continue 1:1 safety sitter -CSW is seeking [facility] referral for priority admission  Disposition: Recommend psychiatric Inpatient admission when medically cleared.  Corena Pilgrim, MD 11/22/2021 12:55 PM

## 2021-11-23 DIAGNOSIS — F201 Disorganized schizophrenia: Secondary | ICD-10-CM | POA: Diagnosis not present

## 2021-11-23 MED ORDER — DIPHENHYDRAMINE HCL 25 MG PO CAPS: 50.0000 mg | ORAL_CAPSULE | Freq: Once | ORAL | Status: DC | PRN

## 2021-11-23 MED ORDER — OLANZAPINE 10 MG PO TABS
5.0000 mg | ORAL_TABLET | Freq: Every day | ORAL | Status: DC
  Administered 2021-11-24 – 2021-12-03 (×10): 5 mg via ORAL
  Filled 2021-11-23 (×10): qty 1

## 2021-11-23 MED ORDER — OLANZAPINE 10 MG PO TABS
15.0000 mg | ORAL_TABLET | Freq: Every day | ORAL | Status: DC
  Administered 2021-11-23 – 2021-12-02 (×10): 15 mg via ORAL
  Filled 2021-11-23 (×10): qty 2

## 2021-11-23 NOTE — TOC Progression Note (Signed)
Transition of Care [facility]) - Progression Note    Patient Details  Name: Molly [name] MRN: [id number] Date of Birth: 08-03-1985  [facility] CM/SW Contact  Lorri Frederick, [name] Phone Number: 11/23/2021, 2:51 PM  Clinical Narrative:   CSW received call from [place], [facility].  CSW confirmed pt still needs bed.  She will remain on the wait list.      Expected Discharge Plan: Psychiatric Hospital Barriers to Discharge: Continued Medical Work up  Expected Discharge Plan and Services Expected Discharge Plan: Psychiatric Hospital In-house Referral: Clinical Social Work     Living arrangements for the past 2 months: Homeless                                       Social Determinants of Health (SDOH) Interventions    Readmission Risk Interventions     No data to display

## 2021-11-23 NOTE — Consult Note (Signed)
Psychiatry consult service continues to follow this patient.  It is noted patient received as needed medication yesterday, after becoming agitated with nursing staff over a cigarette.   Psychiatry will continue to follow at this time.  -Will continue Clozaril 50mg  po BID. Target goal has been reached for Clozaril, will now begin dose reduction of olanzapine to prevent severe decompensation with cross titration.  Will order additional labs for this coming Thursday as we continue to slowly adjust and titrate medications to target schizophrenia. -Will decrease olanzapine 5mg  po a.m. and 15 mg p.o. nightly.  -Patient remains at risk for cardiac events due to multiple antipsychotics, recommend telemetry monitoring when patient tolerates and agrees. Last EKG obtained on 07/21 -QTc 485.  -Will continue medications of Amitiza and Colace, to prevent Clozaril induced constipation. -Continue 1:1 safety sitter.  -Continue [facility] referral, recommend daily contact at this time as she remains on the priority list.
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
## 2021-11-23 NOTE — Plan of Care (Signed)
Asked for a cigarette several times throughout shift. Requested some nicotine gum. Offered a nicotine patch. Patient refused. Educated on benefits of smoking cessation. Demonstrated periods of increased agitation throughout shift. See MAR for PRN medications administered. No other acute events overnight.    Problem: Education: Goal: Knowledge of General Education information will improve Description: Including pain rating scale, medication(s)/side effects and non-pharmacologic comfort measures Outcome: Progressing   Problem: Health Behavior/Discharge Planning: Goal: Ability to manage health-related needs will improve Outcome: Progressing   Problem: Clinical Measurements: Goal: Ability to maintain clinical measurements within normal limits will improve Outcome: Progressing Goal: Respiratory complications will improve Outcome: Progressing Goal: Cardiovascular complication will be avoided Outcome: Progressing   Problem: Activity: Goal: Risk for activity intolerance will decrease Outcome: Progressing   Problem: Nutrition: Goal: Adequate nutrition will be maintained Outcome: Progressing   Problem: Coping: Goal: Level of anxiety will decrease Outcome: Progressing   Problem: Elimination: Goal: Will not experience complications related to bowel motility Outcome: Progressing Goal: Will not experience complications related to urinary retention Outcome: Progressing   Problem: Pain Managment: Goal: General experience of comfort will improve Outcome: Progressing   Problem: Safety: Goal: Ability to remain free from injury will improve Outcome: Progressing   Problem: Skin Integrity: Goal: Risk for impaired skin integrity will decrease Outcome: Progressing
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
## 2021-11-23 NOTE — Plan of Care (Signed)

## 2021-11-24 DIAGNOSIS — F201 Disorganized schizophrenia: Secondary | ICD-10-CM | POA: Diagnosis not present

## 2021-11-24 NOTE — Progress Notes (Signed)
OT Cancellation Note  Patient Details Name: Molly [name] MRN: [id number] DOB: 01/26/1986   Cancelled Treatment:    Reason Eval/Treat Not Completed: Fatigue/lethargy limiting ability to participate;Other (comment) pt initially agreeable to session but after OTA exits room to gather supplies pt then reports she doesn't want to get OOB. Will continue efforts for therapy as time allows.  Lenor Derrick., COTA/L Acute Rehabilitation Services [phone]   Molly [name] 11/24/2021, 3:04 PM

## 2021-11-24 NOTE — Plan of Care (Signed)
No acute events overnight.    Problem: Education: Goal: Knowledge of General Education information will improve Description: Including pain rating scale, medication(s)/side effects and non-pharmacologic comfort measures Outcome: Progressing   Problem: Health Behavior/Discharge Planning: Goal: Ability to manage health-related needs will improve Outcome: Progressing   Problem: Clinical Measurements: Goal: Ability to maintain clinical measurements within normal limits will improve Outcome: Progressing Goal: Respiratory complications will improve Outcome: Progressing Goal: Cardiovascular complication will be avoided Outcome: Progressing   Problem: Activity: Goal: Risk for activity intolerance will decrease Outcome: Progressing   Problem: Nutrition: Goal: Adequate nutrition will be maintained Outcome: Progressing   Problem: Coping: Goal: Level of anxiety will decrease Outcome: Progressing   Problem: Elimination: Goal: Will not experience complications related to bowel motility Outcome: Progressing Goal: Will not experience complications related to urinary retention Outcome: Progressing   Problem: Pain Managment: Goal: General experience of comfort will improve Outcome: Progressing   Problem: Safety: Goal: Ability to remain free from injury will improve Outcome: Progressing   Problem: Skin Integrity: Goal: Risk for impaired skin integrity will decrease Outcome: Progressing   

## 2021-11-24 NOTE — Progress Notes (Signed)
PT Cancellation Note  Patient Details Name: Molly [name] MRN: [id number] DOB: [birth year]   Cancelled Treatment:    Reason Eval/Treat Not Completed: Patient declined, no reason specified; patient sleeping and declined mobility, stated up earlier to shower.  Sitter confirmed pt had vomited and needed to be cleaned so assisted her to the shower.  Reports knee did not buckle walking to shower.  PT will continue attempts.    Elray Mcgregor 11/24/2021, 5:02 PM Sheran Lawless, PT Acute Rehabilitation Services [phone] 11/24/2021
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
## 2021-11-24 NOTE — Consult Note (Signed)
  Psychiatry attempted to reassess and evaluate patient.  Patient was observed to be sleeping, difficult to arouse.  She did awaken briefly to answer a few questions.  She denies any new or acute concerns.  She denies any constipation.  She denies any muscle stiffness restlessness, agitation, tightness of jaw, and or dryness of the mouth. -Psychiatry will continue to follow.

## 2021-11-25 DIAGNOSIS — F201 Disorganized schizophrenia: Secondary | ICD-10-CM | POA: Diagnosis not present

## 2021-11-25 NOTE — Progress Notes (Signed)
OT Cancellation Note  Patient Details Name: Molly [name] MRN: [id number] DOB: 10-14-1985   Cancelled Treatment:    Reason Eval/Treat Not Completed: Fatigue/lethargy limiting ability to participate;Other (comment) pt asleep upon arrival, provided max stimulation to try awakening pt with no success, offered chap stick( she requested this yesterday), lunch tray and ADLs with pt finally stating " leave me alone." Will continue efforts.   Lenor Derrick., COTA/L Acute Rehabilitation Services [phone]   Barron Schmid 11/25/2021, 2:23 PM
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
## 2021-11-25 NOTE — Plan of Care (Signed)

## 2021-11-25 NOTE — Progress Notes (Signed)
PT Cancellation Note  Patient Details Name: Molly [name] MRN: [id number] DOB: [birth year]   Cancelled Treatment:    Reason Eval/Treat Not Completed: Other (comment) Pt sleeping heavily upon arrival; did not awaken with name call.  Lillia Pauls, PT, DPT Acute Rehabilitation Services Office [phone]    Norval Morton 11/25/2021, 12:04 PM

## 2021-11-26 ENCOUNTER — Inpatient Hospital Stay (HOSPITAL_COMMUNITY): Payer: 59

## 2021-11-26 DIAGNOSIS — S82401A Unspecified fracture of shaft of right fibula, initial encounter for closed fracture: Secondary | ICD-10-CM | POA: Diagnosis not present

## 2021-11-26 DIAGNOSIS — S82201A Unspecified fracture of shaft of right tibia, initial encounter for closed fracture: Secondary | ICD-10-CM | POA: Diagnosis not present

## 2021-11-26 DIAGNOSIS — F201 Disorganized schizophrenia: Secondary | ICD-10-CM | POA: Diagnosis not present

## 2021-11-26 NOTE — TOC Progression Note (Addendum)
Transition of Care [facility]) - Progression Note    Patient Details  Name: Molly [name] MRN: [id number] Date of Birth: December 11, 1985  Transition of [facility] CM/SW Contact  Lorri Frederick, [name] Phone Number: 11/26/2021, 2:35 PM  Clinical Narrative:   CSW received phone call from [facility].  CSW confirmed pt still needs bed there and remains on wait list.  Limited update given by [facility], only said that there "has not been much movement" on the wait list and pt is not near the top of the list.   1450: CSW spoke with pt legal guardian, Molly [name], updated her on current situation.  She has been to the floor to see pt several times.  Discussed that pt still does not appear close to being admitted to [facility], psych team here continues to follow and work on meds.  Discussed potential plan if pt does improve enough to consider DC.  Per Molly [name], pt ACT team continues to be [facility]SI and they are available to resume care when needed.  They do not have an apartment yet but do have a possible option once pt is psych stable.  Molly [name] will continue to stay in touch moving forward.     Expected Discharge Plan: Psychiatric Hospital Barriers to Discharge: Continued Medical Work up  Expected Discharge Plan and Services Expected Discharge Plan: Psychiatric Hospital In-house Referral: Clinical Social Work     Living arrangements for the past 2 months: Homeless                                       Social Determinants of Health (SDOH) Interventions    Readmission Risk Interventions     No data to display

## 2021-11-26 NOTE — Progress Notes (Signed)
OT Cancellation Note  Patient Details Name: Molly [name] MRN: [id number] DOB: [birth year]   Cancelled Treatment:    Reason Eval/Treat Not Completed: Patient's level of consciousness. Pt sleeping and unable to participate in OT session. Will re-attempt tomorrow to assess goals and progress in therapy.   Limmie Patricia, OTR/L,CBIS  Supplemental OT - [facility] and [facility]  11/26/2021, 4:03 PM

## 2021-11-26 NOTE — Progress Notes (Signed)
Subjective:   Hospital day 32.  Interval events: No PRNs administered overnight.  Molly [name] is sleeping soundly. She wakes to her name being called. She states she feels "better." She endorses sleepiness.  Objective:  Vital signs: Blood pressure (!) 133/92, pulse (!) 108, temperature 98.6 F (37 C), temperature source Oral, resp. rate 18, height 5\' 8"  (1.727 m), weight 124 kg, SpO2 100 %, unknown if currently breastfeeding.  Physical exam: Constitutional: Female sleeping in bed. Pulmonary: No increased work of breathing noted. Neuro: Alert to verbal. Psych: Disheveled. Soiled clothing and bedsheets.  No intake or output data in the 24 hours ending 11/26/21 0618  Pertinent Labs:    Latest Ref Rng & Units 11/21/2021    6:17 AM 11/13/2021    5:30 AM 11/11/2021    6:29 PM  CBC  WBC 4.0 - 10.5 K/uL 10.8  11.5  10.3   Hemoglobin 12.0 - 15.0 g/dL 01/12/2022  9.8  9.6   Hematocrit 36.0 - 46.0 % 32.0  30.9  29.9   Platelets 150 - 400 K/uL 436  595  581        Latest Ref Rng & Units 11/21/2021    6:17 AM 11/13/2021    5:30 AM 11/11/2021    6:29 PM  CMP  Glucose 70 - 99 mg/dL 01/12/2022  096  045   BUN 6 - 20 mg/dL 8  7  8    Creatinine 0.44 - 1.00 mg/dL 409   8.11   Sodium 135 - 145 mmol/L 136  138  142   Potassium 3.5 - 5.1 mmol/L 4.1  3.7  4.0   Chloride 98 - 111 mmol/L 102  105  104   CO2 22 - 32 mmol/L 27  23  27    Calcium 8.9 - 10.3 mg/dL 8.4  8.6  9.0   Total Protein 6.5 - 8.1 g/dL 6.8  6.6  6.4   Total Bilirubin 0.3 - 1.2 mg/dL 0.3  0.2  0.3   Alkaline Phos 38 - 126 U/L 153  176  182   AST 15 - 41 U/L 16  13  20    ALT 0 - 44 U/L 7  7  8     Imaging: Tibia/fibula, right portable x-ray: No malalignment.  Evidence of interval healing since last study.  EKG: QTc 463.  Assessment/Plan:   Principal Problem:   Disorganized schizophrenia (HCC) Active Problems:   Tibia/fibula fracture, right, closed, initial encounter   Patient Summary: Molly [name] is  a 36 y.o. with a PMH of schizoaffective disorder, who presented with right tibia-fibula fracture and was admitted for ORIF of fracture. Her hospital course has been complicated by psychosis refractory to antipsychotics.  Refractory psychosis, improving Disorganized schizophrenia Now stable for several days.  At this point, Molly [name]'s leg is not a barrier to her completing her ADLs.  Rather, my impression is that her psychiatric condition is the primary factor that prevents her from completing her ADLs independently. She waxes and wanes, some days working well with therapy and demonstrating the ability to shower, dress, and groom herself. Other days, she seems to regress and will remain in a soiled bed. Hopeful for transfer to an inpatient psychiatric facility. - Continue clozapine 50 mg twice daily - Continue Zyprexa 15 mg p.o. nightly - Continue Zyprexa 5 mg p.o. daily - Continue Depakote 1250 mg p.o. twice daily - Continue clonazepam 1.5 mg  p.o. twice daily - Continue Zyprexa 10 mg ODT twice daily as needed for agitation and psychosis - Continue Zyprexa 10 mg IM twice daily as needed for agitation and psychosis (second line) - Continue diphenhydramine 50 mg p.o. as needed for agitation refractory to Zyprexa  Fracture of right tibia and fibula - Ortho follow-up in outpatient setting - Acetaminophen 1000 mg p.o. every 8 hours for pain - Continue PT OT  Diet: Normal IVF: None,None VTE: Rivaroxaban Code: Full PT/OT recs: walker.  Dispo: Anticipated discharge to  inpatient psychiatric facility  pending availability.   Marrianne Mood MD 11/26/2021, 6:18 AM  Pager: [phone] After 5pm on weekdays and 1pm on weekends: On Call pager: [phone]

## 2021-11-26 NOTE — Plan of Care (Signed)
  Problem: Education: Goal: Knowledge of General Education information will improve Description: Including pain rating scale, medication(s)/side effects and non-pharmacologic comfort measures Outcome: Not Progressing   Problem: Health Behavior/Discharge Planning: Goal: Ability to manage health-related needs will improve Outcome: Not Progressing   Problem: Clinical Measurements: Goal: Ability to maintain clinical measurements within normal limits will improve Outcome: Not Progressing Goal: Respiratory complications will improve Outcome: Not Progressing Goal: Cardiovascular complication will be avoided Outcome: Not Progressing   Problem: Activity: Goal: Risk for activity intolerance will decrease Outcome: Not Progressing

## 2021-11-26 NOTE — Progress Notes (Signed)
[facility]   Follow up xrays of R tibia look excellent Continue WBAT R leg, would continue to used hinged brace for another 2 weeks then she may dc  No other restrictions Continue with therapies as pt is able and willing   Mearl Latin, PA-C [phone] (C) 11/26/2021, 11:08 AM  [facility] [address] [place] [address] [phone] (F)      Patient ID: Molly [name], female   DOB: 1985/09/06, 36 y.o.   MRN: [id number]

## 2021-11-26 NOTE — Progress Notes (Signed)
PT Cancellation Note  Patient Details Name: Molly [name] MRN: [id number] DOB: 07/19/1985   Cancelled Treatment:    Reason Eval/Treat Not Completed: Patient declined, no reason specified. Consulted with RN and NT prior to entering room. Per RN/NT, pt was agitated earlier this morning, refusing care then throwing towels on floor and screaming (specifically when R leg was moved). Upon entering, pt in sidelying with eyes closed but moving LUE off bedrail. Offered OOB mobility, however pt did not speak and made no effort to engage or make eye contact with therapist. Did not continue to pursue treatment to avoid further agitating pt. Will attempt therapy at another time.   Marlana Salvage Imagene Gurney PT, DPT  11/26/2021, 11:37 AM

## 2021-11-26 NOTE — Progress Notes (Signed)
Patient refused her noon time Meds and refused dressing change.

## 2021-11-26 NOTE — Progress Notes (Signed)
Pt refused dressing change.

## 2021-11-26 NOTE — Consult Note (Signed)
  Psychiatry consult service continues to follow this patient.   Patient seen and attempted to assess, per usual patient is asleep, somnolent will arouse easily, however falls asleep before answering any additional questions.  On this occasion patient is able to answer " good" to how are you feeling today?  She answers yes to sleep and appetite.  She also answers yes to working with physical therapy, and applying weight to her leg.  All additional questions went unanswered as patient went back to sleep.  Psychiatry will continue to follow at this time.  -Will continue Clozaril 50mg  po BID. Target goal has been reached for Clozaril, will now begin dose reduction of olanzapine to prevent severe decompensation with cross titration.  Will order additional labs for this coming Friday as we continue to slowly adjust and titrate medications to target schizophrenia. -Will continue olanzapine 5mg  po a.m. and 15 mg p.o. nightly.  Plan to adjust medication tomorrow, pending labs. -Patient remains at risk for cardiac events due to multiple antipsychotics, will obtain repeat EKG when patient tolerates and agrees. Last EKG obtained on 07/21 -QTc 485.  -Will continue medications of Amitiza and Colace, to prevent Clozaril induced constipation. -Continue 1:1 safety sitter.  -Continue [facility] referral, recommend daily contact at this time as she remains on the priority list.

## 2021-11-26 NOTE — Progress Notes (Signed)
Patient refused EKG 9:40AM

## 2021-11-27 DIAGNOSIS — S82401A Unspecified fracture of shaft of right fibula, initial encounter for closed fracture: Secondary | ICD-10-CM | POA: Diagnosis not present

## 2021-11-27 DIAGNOSIS — F201 Disorganized schizophrenia: Secondary | ICD-10-CM | POA: Diagnosis not present

## 2021-11-27 DIAGNOSIS — S82201A Unspecified fracture of shaft of right tibia, initial encounter for closed fracture: Secondary | ICD-10-CM | POA: Diagnosis not present

## 2021-11-27 LAB — CBC WITH DIFFERENTIAL/PLATELET
Abs Immature Granulocytes: 0.08 10*3/uL — ABNORMAL HIGH (ref 0.00–0.07)
Basophils Absolute: 0.1 10*3/uL (ref 0.0–0.1)
Basophils Relative: 1 %
Eosinophils Absolute: 0.3 10*3/uL (ref 0.0–0.5)
Eosinophils Relative: 3 %
HCT: 31.3 % — ABNORMAL LOW (ref 36.0–46.0)
Hemoglobin: 10.1 g/dL — ABNORMAL LOW (ref 12.0–15.0)
Immature Granulocytes: 1 %
Lymphocytes Relative: 31 %
Lymphs Abs: 3.1 10*3/uL (ref 0.7–4.0)
MCH: 27.6 pg (ref 26.0–34.0)
MCHC: 32.3 g/dL (ref 30.0–36.0)
MCV: 85.5 fL (ref 80.0–100.0)
Monocytes Absolute: 1.4 10*3/uL — ABNORMAL HIGH (ref 0.1–1.0)
Monocytes Relative: 14 %
Neutro Abs: 5.1 10*3/uL (ref 1.7–7.7)
Neutrophils Relative %: 50 %
Platelets: 311 10*3/uL (ref 150–400)
RBC: 3.66 MIL/uL — ABNORMAL LOW (ref 3.87–5.11)
RDW: 16.3 % — ABNORMAL HIGH (ref 11.5–15.5)
WBC: 10 10*3/uL (ref 4.0–10.5)
nRBC: 0 % (ref 0.0–0.2)

## 2021-11-27 LAB — COMPREHENSIVE METABOLIC PANEL
ALT: 8 U/L (ref 0–44)
AST: 14 U/L — ABNORMAL LOW (ref 15–41)
Albumin: 2.9 g/dL — ABNORMAL LOW (ref 3.5–5.0)
Alkaline Phosphatase: 135 U/L — ABNORMAL HIGH (ref 38–126)
Anion gap: 8 (ref 5–15)
BUN: 8 mg/dL (ref 6–20)
CO2: 25 mmol/L (ref 22–32)
Calcium: 8.7 mg/dL — ABNORMAL LOW (ref 8.9–10.3)
Chloride: 105 mmol/L (ref 98–111)
Creatinine, Ser: 0.65 mg/dL (ref 0.44–1.00)
GFR, Estimated: >60 mL/min (ref >60)
Glucose, Bld: 98 mg/dL (ref 70–99)
Potassium: 4 mmol/L (ref 3.5–5.1)
Sodium: 138 mmol/L (ref 135–145)
Total Bilirubin: 0.2 mg/dL — ABNORMAL LOW (ref 0.3–1.2)
Total Protein: 6.7 g/dL (ref 6.5–8.1)

## 2021-11-27 LAB — TROPONIN I (HIGH SENSITIVITY): Troponin I (High Sensitivity): 4 ng/L (ref <18)

## 2021-11-27 LAB — CK: Total CK: 33 U/L — ABNORMAL LOW (ref 38–234)

## 2021-11-27 NOTE — Consult Note (Signed)
  Patient had initially refused labs obtained to adjust clozaril dosage, those labs are now pending.  Will continue with no additional changes made at this time. Patient required prn over night last night due to agitation.   Psychiatry will continue to follow at this time.   -Will continue Clozaril 50mg  po BID. Target goal has been reached for Clozaril, will now begin dose reduction of olanzapine to prevent severe decompensation with cross titration.  Labs ordered are pending at time of this note, will adjust tomorrow.   -Will continue olanzapine 5mg  po a.m. and 15 mg p.o. nightly.  Plan to adjust medication tomorrow, labs pending at this time.  -Patient remains at risk for cardiac events due to multiple antipsychotics. Last EKG obtained on 07/28 -QTc 463.  -Will continue medications of Amitiza and Colace, to prevent Clozaril induced constipation. -Continue 1:1 safety sitter.  -Continue [facility] referral, recommend daily contact at this time as she remains on the priority list.

## 2021-11-27 NOTE — Progress Notes (Signed)
PT Cancellation Note  Patient Details Name: ESMAY AMSPACHER MRN: [id number] DOB: [birth year]   Cancelled Treatment:    Reason Eval/Treat Not Completed: Patient declined, no reason specified. Pt sitting upright at EOB eating graham crackers and ice cream. She declined participation at this time. PT will continue to f/u with pt acutely as available and appropriate.    Alessandra Bevels Lucah Petta 11/27/2021, 1:18 PM

## 2021-11-27 NOTE — Plan of Care (Signed)

## 2021-11-27 NOTE — Progress Notes (Signed)
Pt wet and refusing to get out of bed to clean herself.

## 2021-11-27 NOTE — Progress Notes (Signed)
OT Cancellation Note  Patient Details Name: Molly [name] MRN: [id number] DOB: 1985/12/16   Cancelled Treatment:    Reason Eval/Treat Not Completed: Fatigue/lethargy limiting ability to participate. Pt sleeping upon therapy arrival. With Nurse Tech assist, pt did verbalize acknowledgement of therapist. Was unable to wake up fully to participate in OT treatment session. OT completed caregiver interview with sitter to assess patient's current functional performance during ADL tasks.  Staff reports that patient is able to complete LB dressing, grooming, toilet transfer, and bathing tasks herself if she is mentally willing. Activity participation changes frequently. Recently, patient has been sleeping during the day which has limited her level of participation with therapy staff. Goals have been updated based on patient's progress with ADL tasks; see care plan.   Limmie Patricia, OTR/L,CBIS  Supplemental OT - [facility]  11/27/2021, 4:58 PM

## 2021-11-27 NOTE — Plan of Care (Signed)
  Problem: Acute Rehab OT Goals (only OT should resolve) Goal: Pt. Will Perform Grooming Outcome: Completed/Met Goal: Pt. Will Perform Lower Body Dressing Outcome: Completed/Met Goal: Pt. Will Transfer To Toilet Outcome: Completed/Met Goal: Pt. Will Perform Toileting-Clothing Manipulation Outcome: Completed/Met   Problem: Acute Rehab OT Goals (only OT should resolve) Goal: OT Additional ADL Goal #1 Flowsheets (Taken 11/27/2021 1706) Additional ADL Goal #1: Patient will demonstrate improved activity participation and task attention, in therapy session, while participating in ADL task for 15 minutes, 3 out of 5 sessions. Goal: OT Additional ADL Goal #2 Flowsheets (Taken 11/27/2021 1706) Additional ADL Goal #2: Patient will select and participate in a productive leisure activity in order to increase motivation to participate in self care tasks with minimal prompts to engage. Goal: OT Additional ADL Goal #3 Flowsheets (Taken 11/27/2021 1706) Additional ADL Goal #3: Patient will be educated and participate in developing appropriate sleep hygiene routine in order to allow her to sleep at appropriate evening times and be alert during the day time to participate in all therapy sessions.

## 2021-11-27 NOTE — Progress Notes (Signed)
Pt became highly agitated and screaming during the night after linen changed. Pt given prn zyprexa. Noted to be effective at this time.

## 2021-11-27 NOTE — Progress Notes (Signed)
Subjective:   Hospital day 84.  Interval events: Agitation requiring Zyprexa ODT.  Reports feeling okay this morning.  Wants to know when she can leave the hospital.  Does not have concerns for safety or management of her schizophrenia upon discharge.  No new complaints.  Objective:  Vital signs: Blood pressure (!) 131/103, pulse 98, temperature 98 F (36.7 C), temperature source Oral, resp. rate 20, height 5\' 8"  (1.727 m), weight 124 kg, SpO2 97 %, unknown if currently breastfeeding.  Physical exam: Constitutional: Female sitting upright in bed.  In no apparent acute distress. Pulmonary: No increased work of breathing noted. Neuro: Alert and oriented. Psych: Pleasant.  Appropriate mood and affect.  Poor insight.   Intake/Output Summary (Last 24 hours) at 11/27/2021 1323 Last data filed at 11/27/2021 1215 Gross per 24 hour  Intake 480 ml  Output --  Net 480 ml    Pertinent Labs:    Latest Ref Rng & Units 11/27/2021   10:44 AM 11/21/2021    6:17 AM 11/13/2021    5:30 AM  CBC  WBC 4.0 - 10.5 K/uL 10.0  10.8  11.5   Hemoglobin 12.0 - 15.0 g/dL 11/15/2021  38.2  9.8   Hematocrit 36.0 - 46.0 % 31.3  32.0  30.9   Platelets 150 - 400 K/uL 311  436  595        Latest Ref Rng & Units 11/27/2021   10:44 AM 11/21/2021    6:17 AM 11/13/2021    5:30 AM  CMP  Glucose 70 - 99 mg/dL 98  11/15/2021  397   BUN 6 - 20 mg/dL 8  8  7    Creatinine 0.44 - 1.00 mg/dL 673   4.19   Sodium 135 - 145 mmol/L 138  136  138   Potassium 3.5 - 5.1 mmol/L 4.0  4.1  3.7   Chloride 98 - 111 mmol/L 105  102  105   CO2 22 - 32 mmol/L 25  27  23    Calcium 8.9 - 10.3 mg/dL 8.7  8.4  8.6   Total Protein 6.5 - 8.1 g/dL 6.7  6.8  6.6   Total Bilirubin 0.3 - 1.2 mg/dL 0.2  0.3  0.2   Alkaline Phos 38 - 126 U/L 135  153  176   AST 15 - 41 U/L 14  16  13    ALT 0 - 44 U/L 8  7  7     Neutrophils, troponin, CK, CMP without abnormalities attributable to antipsychotics  Assessment/Plan:    Principal Problem:   Disorganized schizophrenia (HCC) Active Problems:   Tibia/fibula fracture, right, closed, initial encounter   Patient Summary: Molly [name] is a 36 y.o. with a PMH of schizoaffective disorder, who presented with right tibia-fibula fracture and was admitted for ORIF of fracture. Her hospital course has been complicated by psychosis refractory to antipsychotics.  Refractory psychosis, improving Disorganized schizophrenia Waxing and waning.  Conversation today suggests poor insight into her condition.  She has no concern for leaving the hospital and returning to the streets without support or follow-up.  Hopeful for transfer to an inpatient psychiatric facility.  Labs normal today, anticipate psychiatry will continue increasing clozapine. - Continue clozapine 50 mg twice daily - Continue Zyprexa 15 mg p.o. nightly - Continue Zyprexa 5 mg p.o. daily - Continue Depakote 1250 mg p.o. twice daily - Continue clonazepam 1.5 mg p.o.  twice daily - Continue Zyprexa 10 mg ODT twice daily as needed for agitation and psychosis - Continue Zyprexa 10 mg IM twice daily as needed for agitation and psychosis (second line) - Continue diphenhydramine 50 mg p.o. as needed for agitation refractory to Zyprexa  Fracture of right tibia and fibula - Ortho follow-up in outpatient setting - Acetaminophen 1000 mg p.o. every 8 hours for pain - Continue PT OT   Diet: Normal IVF: None,None VTE: Rivaroxaban Code: Full PT/OT recs: walker.   Dispo: Anticipated discharge to  inpatient psychiatric facility  pending availability.   Marrianne Mood MD 11/27/2021, 1:23 PM  Pager: [phone] After 5pm on weekdays and 1pm on weekends: On Call pager: [phone]

## 2021-11-28 DIAGNOSIS — S82401A Unspecified fracture of shaft of right fibula, initial encounter for closed fracture: Secondary | ICD-10-CM | POA: Diagnosis not present

## 2021-11-28 DIAGNOSIS — F201 Disorganized schizophrenia: Secondary | ICD-10-CM | POA: Diagnosis not present

## 2021-11-28 DIAGNOSIS — S82201A Unspecified fracture of shaft of right tibia, initial encounter for closed fracture: Secondary | ICD-10-CM | POA: Diagnosis not present

## 2021-11-28 MED ORDER — NICOTINE POLACRILEX 2 MG MT GUM
2.0000 mg | CHEWING_GUM | OROMUCOSAL | Status: DC | PRN
  Administered 2021-11-28 – 2021-12-01 (×6): 2 mg via ORAL
  Filled 2021-11-28 (×7): qty 1

## 2021-11-28 NOTE — Progress Notes (Addendum)
Subjective:   Hospital day 34.  Interval events: Noted to have declined physical therapy and was too sleepy for Occupational Therapy.  Declines to change out of soiled clothes and clean herself.  Sleeping.  Will reevaluate in the afternoon.  Afternoon: Still sleeping. Arouses to voice. No new complaints.  Objective:  Vital signs: Blood pressure (!) 113/94, pulse (!) 108, temperature 97.6 F (36.4 C), temperature source Axillary, resp. rate 17, height 5\' 8"  (1.727 m), weight 124 kg, SpO2 100 %, unknown if currently breastfeeding.  Physical exam: Constitutional: Sleeping female. Pulmonary: Normal work of breathing. Lungs clear. Skin: Warm and dry. Neuro: Alert to verbal. Answers questions appropriately.   Intake/Output Summary (Last 24 hours) at 11/28/2021 1015 Last data filed at 11/27/2021 1901 Gross per 24 hour  Intake 960 ml  Output --  Net 960 ml    Pertinent Labs:    Latest Ref Rng & Units 11/27/2021   10:44 AM 11/21/2021    6:17 AM 11/13/2021    5:30 AM  CBC  WBC 4.0 - 10.5 K/uL 10.0  10.8  11.5   Hemoglobin 12.0 - 15.0 g/dL 11/15/2021  56.4  9.8   Hematocrit 36.0 - 46.0 % 31.3  32.0  30.9   Platelets 150 - 400 K/uL 311  436  595        Latest Ref Rng & Units 11/27/2021   10:44 AM 11/21/2021    6:17 AM 11/13/2021    5:30 AM  CMP  Glucose 70 - 99 mg/dL 98  11/15/2021  951   BUN 6 - 20 mg/dL 8  8  7    Creatinine 0.44 - 1.00 mg/dL 884   1.66   Sodium 135 - 145 mmol/L 138  136  138   Potassium 3.5 - 5.1 mmol/L 4.0  4.1  3.7   Chloride 98 - 111 mmol/L 105  102  105   CO2 22 - 32 mmol/L 25  27  23    Calcium 8.9 - 10.3 mg/dL 8.7  8.4  8.6   Total Protein 6.5 - 8.1 g/dL 6.7  6.8  6.6   Total Bilirubin 0.3 - 1.2 mg/dL 0.2  0.3  0.2   Alkaline Phos 38 - 126 U/L 135  153  176   AST 15 - 41 U/L 14  16  13    ALT 0 - 44 U/L 8  7  7     High-sensitivity troponin: 4 CK: 33 Absolute neutrophils: 5.1  EKG: QTc 463  Assessment/Plan:   Principal  Problem:   Disorganized schizophrenia (HCC) Active Problems:   Tibia/fibula fracture, right, closed, initial encounter   Patient Summary: Molly [name] is a 36 y.o. with a PMH of schizoaffective disorder, who was admitted for surgical repair of right tibia/fibula fracture, whose hospital course has been complicated by psychosis refractory to antipsychotics.  Refractory psychosis, improving Disorganized schizophrenia Waxing and waning.  Anticipate clozapine increase today. - Continue clozapine 50 mg twice daily - Continue Zyprexa 15 mg p.o. nightly - Continue Zyprexa 5 mg p.o. daily - Continue Depakote 1250 mg p.o. twice daily - Continue clonazepam 1.5 mg p.o. twice daily - Continue Zyprexa 10 mg ODT twice daily as needed for agitation and psychosis - Continue Zyprexa 10 mg IM twice daily as needed for agitation and psychosis (second line) - Continue diphenhydramine 50 mg p.o. as needed for agitation refractory to Zyprexa  Fracture of  right tibia and fibula - Ortho follow-up in outpatient setting - Acetaminophen 1000 mg p.o. every 8 hours for pain - Continue PT OT   Diet: Normal IVF: None,None VTE: Rivaroxaban Code: Full PT/OT recs: walker.   Dispo: Anticipated discharge to  inpatient psychiatric facility  pending availability.   Marrianne Mood MD 11/28/2021, 10:15 AM  Pager: [phone] After 5pm on weekdays and 1pm on weekends: On Call pager: [phone]

## 2021-11-28 NOTE — Plan of Care (Signed)

## 2021-11-29 DIAGNOSIS — K219 Gastro-esophageal reflux disease without esophagitis: Secondary | ICD-10-CM

## 2021-11-29 DIAGNOSIS — S82201A Unspecified fracture of shaft of right tibia, initial encounter for closed fracture: Secondary | ICD-10-CM | POA: Diagnosis not present

## 2021-11-29 DIAGNOSIS — S82401A Unspecified fracture of shaft of right fibula, initial encounter for closed fracture: Secondary | ICD-10-CM | POA: Diagnosis not present

## 2021-11-29 DIAGNOSIS — F201 Disorganized schizophrenia: Secondary | ICD-10-CM | POA: Diagnosis not present

## 2021-11-29 MED ORDER — CLOZAPINE 25 MG PO TABS
75.0000 mg | ORAL_TABLET | Freq: Every day | ORAL | Status: DC
  Administered 2021-11-30 – 2021-12-02 (×4): 75 mg via ORAL
  Filled 2021-11-29 (×4): qty 3

## 2021-11-29 MED ORDER — CLOZAPINE 25 MG PO TABS
50.0000 mg | ORAL_TABLET | Freq: Every day | ORAL | Status: DC
  Administered 2021-11-30 – 2021-12-03 (×4): 50 mg via ORAL
  Filled 2021-11-29 (×4): qty 2

## 2021-11-29 MED ORDER — PANTOPRAZOLE SODIUM 20 MG PO TBEC
20.0000 mg | DELAYED_RELEASE_TABLET | Freq: Every day | ORAL | Status: DC
  Administered 2021-11-29 – 2021-12-03 (×5): 20 mg via ORAL
  Filled 2021-11-29 (×5): qty 1

## 2021-11-29 MED ORDER — PANTOPRAZOLE SODIUM 40 MG PO TBEC
40.0000 mg | DELAYED_RELEASE_TABLET | Freq: Every day | ORAL | Status: DC
Start: 2021-11-29 — End: 2021-11-29

## 2021-11-29 NOTE — Progress Notes (Signed)
Subjective:   Hospital day 36.  Interval events: No unscheduled olanzapine overnight.  No new complaints.  Questions about discharge planning.  Wants to go to an inpatient facility where smoking is allowed.  Objective:  Vital signs: Blood pressure 128/79, pulse (!) 108, temperature 98.6 F (37 C), resp. rate 18, height 5\' 8"  (1.727 m), weight 124 kg, SpO2 100 %, unknown if currently breastfeeding.  Physical exam: Constitutional: Female sitting upright in bed.  No apparent distress. Pulmonary: No increased work of breathing noted. Skin: Warm and dry. Extremities: Healing wounds at sites of prior surgical sutures on right lower extremity. Neuro: Alert and oriented. Psych: Pleasant.  Appropriate mood and affect.   Intake/Output Summary (Last 24 hours) at 11/29/2021 0647 Last data filed at 11/29/2021 0200 Gross per 24 hour  Intake 480 ml  Output --  Net 480 ml    Pertinent Labs:    Latest Ref Rng & Units 11/27/2021   10:44 AM 11/21/2021    6:17 AM 11/13/2021    5:30 AM  CBC  WBC 4.0 - 10.5 K/uL 10.0  10.8  11.5   Hemoglobin 12.0 - 15.0 g/dL 11/15/2021  96.2  9.8   Hematocrit 36.0 - 46.0 % 31.3  32.0  30.9   Platelets 150 - 400 K/uL 311  436  595        Latest Ref Rng & Units 11/27/2021   10:44 AM 11/21/2021    6:17 AM 11/13/2021    5:30 AM  CMP  Glucose 70 - 99 mg/dL 98  11/15/2021  841   BUN 6 - 20 mg/dL 8  8  7    Creatinine 0.44 - 1.00 mg/dL 324   4.01   Sodium 135 - 145 mmol/L 138  136  138   Potassium 3.5 - 5.1 mmol/L 4.0  4.1  3.7   Chloride 98 - 111 mmol/L 105  102  105   CO2 22 - 32 mmol/L 25  27  23    Calcium 8.9 - 10.3 mg/dL 8.7  8.4  8.6   Total Protein 6.5 - 8.1 g/dL 6.7  6.8  6.6   Total Bilirubin 0.3 - 1.2 mg/dL 0.2  0.3  0.2   Alkaline Phos 38 - 126 U/L 135  153  176   AST 15 - 41 U/L 14  16  13    ALT 0 - 44 U/L 8  7  7      Assessment/Plan:   Principal Problem:   Disorganized schizophrenia (HCC) Active Problems:   Tibia/fibula  fracture, right, closed, initial encounter   Patient Summary: Molly [name] is a 36 y.o. with a PMH of schizoaffective disorder, who was admitted for surgical repair of right tibia/fibula fracture, whose hospital course has been complicated by psychosis refractory to antipsychotics.  Schizoaffective disorder Psychosis refractory to antipsychotics Continues to respond to internal stimuli.  Psychiatry following closely.  and [facility] referral.  Continue clozapine adverse effect monitoring. - Increase clozapine from 50 mg twice daily to 50 mg every morning and 75 mg nightly. - Continue Zyprexa 5 mg every morning and 15 mg nightly - Continue Amitiza and Colace to prevent clozapine induced constipation - Weekly CBC with differential - Weekly EKG for QT interval monitoring  Fracture of right tibia and fibula - Ortho follow-up in outpatient setting - Acetaminophen 1000 mg p.o. every 8 hours for pain - Continue PT/OT  Gastro esophageal reflux Reports from nursing suggest the patient has been somewhat nauseous over the last several days. - Trial Protonix 20 mg daily  Diet: Normal IVF: None,None VTE: Rivaroxaban Code: Full PT/OT recs: walker.   Dispo: Anticipated discharge to  inpatient psychiatric facility  pending availability.   Marrianne Mood MD 11/29/2021, 6:47 AM  Pager: [phone] After 5pm on weekdays and 1pm on weekends: On Call pager: [phone]

## 2021-11-29 NOTE — Progress Notes (Signed)
Pt told Clinical research associate that she "was given a dead girls name" when asked what her name was pt stated that she "does not have a name" and that she is "jane doePublic relations account executive asked pt what she means by a dead girls name and pt said that "Jesenya died in the 2nd grade by a tree. She committed suicide when she was in her second year by the monkey bars on a tree"   Pt sitting calm and cooperative on the bed rocking back and forth. RN aware. Will continue to monitor.
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
## 2021-11-30 DIAGNOSIS — F201 Disorganized schizophrenia: Secondary | ICD-10-CM | POA: Diagnosis not present

## 2021-11-30 DIAGNOSIS — S82201A Unspecified fracture of shaft of right tibia, initial encounter for closed fracture: Secondary | ICD-10-CM | POA: Diagnosis not present

## 2021-11-30 DIAGNOSIS — K219 Gastro-esophageal reflux disease without esophagitis: Secondary | ICD-10-CM | POA: Diagnosis not present

## 2021-11-30 DIAGNOSIS — S82401A Unspecified fracture of shaft of right fibula, initial encounter for closed fracture: Secondary | ICD-10-CM | POA: Diagnosis not present

## 2021-11-30 MED ORDER — RIVAROXABAN 10 MG PO TABS
10.0000 mg | ORAL_TABLET | Freq: Every day | ORAL | Status: DC
  Administered 2021-11-30 – 2021-12-03 (×4): 10 mg via ORAL
  Filled 2021-11-30 (×4): qty 1

## 2021-11-30 NOTE — TOC Progression Note (Signed)
Transition of Care (TOC) - Progression Note  Late Entry- CSW assisted with IVC paperwork being revised and resubmitted to Magistrate's office. Officer to serve pt with ned IVC custody order. IVC Paperwork faxed to [employer] and original on pt's chart.  Papers are good for 7 days.  Patient Details  Name: Molly [name] MRN: [id number] Date of Birth: [birth year]  Transition of Care [facility]) CM/SW Contact  Elijah Birk Joesph Fill[place], [place] Phone Number: 11/30/2021, 8:45 AM  Clinical Narrative:       Expected Discharge Plan: Psychiatric Hospital Barriers to Discharge: Continued Medical Work up  Expected Discharge Plan and Services Expected Discharge Plan: Psychiatric Hospital In-house Referral: Clinical Social Work     Living arrangements for the past 2 months: Homeless                                       Social Determinants of Health (SDOH) Interventions    Readmission Risk Interventions     No data to display

## 2021-11-30 NOTE — TOC Progression Note (Addendum)
Transition of Care [facility]) - Progression Note    Patient Details  Name: Molly [name] MRN: [id number] Date of Birth: [birth year]  Transition of Care [facility]) CM/SW Contact  Eduard Roux, [place] Phone Number: 11/30/2021, 1:49 PM  Clinical Narrative:     [facility]- patient remains on the waiting list.  TOC will continue to follow and assist with discharge planning.   Expected Discharge Plan: Psychiatric Hospital Barriers to Discharge: Continued Medical Work up  Expected Discharge Plan and Services Expected Discharge Plan: Psychiatric Hospital In-house Referral: Clinical Social Work     Living arrangements for the past 2 months: Homeless                                       Social Determinants of Health (SDOH) Interventions    Readmission Risk Interventions     No data to display
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
## 2021-11-30 NOTE — Plan of Care (Signed)

## 2021-11-30 NOTE — Progress Notes (Signed)
OT Cancellation Note  Patient Details Name: MASEY SCHEIBER MRN: [id number] DOB: 15-Nov-1985   Cancelled Treatment:    Reason Eval/Treat Not Completed: Patient declined, no reason specified When attempting to initiate OT session, pt reports "leave me alone" then proceeded to ignore therapist. Will follow up at a later time.  Lorre Munroe 11/30/2021, 11:53 AM

## 2021-11-30 NOTE — Progress Notes (Addendum)
HD#36 Subjective:   Summary: [name] was sleeping but arousable. No new complaints. She is waiting for [facility] to accept her.   Overnight Events: none    Objective:  Vital signs in last 24 hours: Vitals:   11/28/21 0809 11/28/21 2140 11/29/21 0541 11/29/21 2340  BP: (!) 113/94 92/73 128/79 (!) 148/84  Pulse:  100 (!) 108 (!) 102  Resp: 17 18 18 16   Temp: 97.6 F (36.4 C) 99.1 F (37.3 C) 98.6 F (37 C) 98.4 F (36.9 C)  TempSrc: Axillary Oral    SpO2: 100% 100% 100% 98%  Weight:      Height:        Physical Exam:  Constitutional: female sleeping in bed, in no acute distress HENT: normocephalic atraumatic Neurological: alert & oriented Skin: warm and dry Psych: Pleasant. Appropriate mood. Cooperative behavior.   Filed Weights   10/25/21 0646 10/26/21 1140  Weight: 124 kg 124 kg    No intake or output data in the 24 hours ending 11/30/21 1302 Net IO Since Admission: 26,177 mL [11/30/21 1302]  Pertinent Labs:    Latest Ref Rng & Units 11/27/2021   10:44 AM 11/21/2021    6:17 AM 11/13/2021    5:30 AM  CBC  WBC 4.0 - 10.5 K/uL 10.0  10.8  11.5   Hemoglobin 12.0 - 15.0 g/dL 11/15/2021  12.4  9.8   Hematocrit 36.0 - 46.0 % 31.3  32.0  30.9   Platelets 150 - 400 K/uL 311  436  595        Latest Ref Rng & Units 11/27/2021   10:44 AM 11/21/2021    6:17 AM 11/13/2021    5:30 AM  CMP  Glucose 70 - 99 mg/dL 98  11/15/2021  998   BUN 6 - 20 mg/dL 8  8  7    Creatinine 0.44 - 1.00 mg/dL 338   2.50   Sodium 135 - 145 mmol/L 138  136  138   Potassium 3.5 - 5.1 mmol/L 4.0  4.1  3.7   Chloride 98 - 111 mmol/L 105  102  105   CO2 22 - 32 mmol/L 25  27  23    Calcium 8.9 - 10.3 mg/dL 8.7  8.4  8.6   Total Protein 6.5 - 8.1 g/dL 6.7  6.8  6.6   Total Bilirubin 0.3 - 1.2 mg/dL 0.2  0.3  0.2   Alkaline Phos 38 - 126 U/L 135  153  176   AST 15 - 41 U/L 14  16  13    ALT 0 - 44 U/L 8  7  7      Imaging: No results found.  Assessment/Plan:   Principal Problem:   Disorganized  schizophrenia (HCC) Active Problems:   Tibia/fibula fracture, right, closed, initial encounter   Patient Summary: Molly [name] is a 36 y.o. with a pertinent PMH of disorganized schizophrenia who was admitted for surgical repair of the right tibia/fibula fracture, hospital course complicated by disorganized schizophrenia refractory to antipsychotics.    Schizoaffective disorder Psychosis refractory to antipsychotics She is followed closely by psychiatry and they are titrating her medications. Continues to respond to internal stimuli. . On wait-list for [facility]. Will monitor for clozapine adverse effects.  -continue clozapine 50 mg in morning and 75 mg nightly -continue zyprexa 5 mg every morning and 15 mg nightly -continue amitiza and colace for drug-induced constipation -weekly CBC with diff -weekly EKG for QT interval monitoring  Fracture of  right tibia and fibula Continue pain management.  Will need outpatient Ortho follow-up. -Acetaminophen 1000 mg every 8 hours for pain -Continue PT/OT   Gastro esophageal reflux Nursing reports nausea over past several days. Patient denies nausea or vomiting today. Trial of Protonix 20 mg daily.  -continue protonix   TOC -currently IVC  Diet: Normal IVF: None,None VTE:  Rivaroxaban Code: Full PT/OT recs: walker. TOC recs: continue IVC   Dispo: Anticipated discharge to  Inpatient Psychiatric Facility pending availability.   Rana Snare, DO Internal Medicine Resident PGY-1 Please contact the on call pager after 5 pm and on weekends at [phone].

## 2021-12-01 DIAGNOSIS — S82201A Unspecified fracture of shaft of right tibia, initial encounter for closed fracture: Secondary | ICD-10-CM | POA: Diagnosis not present

## 2021-12-01 DIAGNOSIS — K219 Gastro-esophageal reflux disease without esophagitis: Secondary | ICD-10-CM | POA: Diagnosis not present

## 2021-12-01 DIAGNOSIS — F201 Disorganized schizophrenia: Secondary | ICD-10-CM | POA: Diagnosis not present

## 2021-12-01 DIAGNOSIS — S82401A Unspecified fracture of shaft of right fibula, initial encounter for closed fracture: Secondary | ICD-10-CM | POA: Diagnosis not present

## 2021-12-01 NOTE — Progress Notes (Signed)
HD#37 Subjective:   Summary: Ms. Spivey reports no new complaints. She states her right leg "is asleep". Able to walk with PT yesterday. She has been scratching at the surgical sites on her right leg and ankle. States that the wound dressing bothers her.    Overnight Events: none    Objective:  Vital signs in last 24 hours: Vitals:   11/29/21 0541 11/29/21 2340 11/30/21 2344 11/30/21 2355  BP: 128/79 (!) 148/84 111/73   Pulse: (!) 108 (!) 102 (!) 112 99  Resp: 18 16 18    Temp: 98.6 F (37 C) 98.4 F (36.9 C) 98.6 F (37 C)   TempSrc:   Oral   SpO2: 100% 98% 100%   Weight:      Height:        Physical Exam:  Constitutional: alert, sitting up in bed, in no acute distress HENT: normocephalic atraumatic MSK: normal bulk and tone Neurological: alert & oriented x 3 Extremities: healing wounds at surgical sites on right LE, open wound near medial malleolus Psych: anxious mood, cooperative behavior  Filed Weights   10/25/21 0646 10/26/21 1140  Weight: 124 kg 124 kg    No intake or output data in the 24 hours ending 12/01/21 0644 Net IO Since Admission: 26,177 mL [12/01/21 0644]  Pertinent Labs:    Latest Ref Rng & Units 11/27/2021   10:44 AM 11/21/2021    6:17 AM 11/13/2021    5:30 AM  CBC  WBC 4.0 - 10.5 K/uL 10.0  10.8  11.5   Hemoglobin 12.0 - 15.0 g/dL 11/15/2021  24.0  9.8   Hematocrit 36.0 - 46.0 % 31.3  32.0  30.9   Platelets 150 - 400 K/uL 311  436  595        Latest Ref Rng & Units 11/27/2021   10:44 AM 11/21/2021    6:17 AM 11/13/2021    5:30 AM  CMP  Glucose 70 - 99 mg/dL 98  11/15/2021  532   BUN 6 - 20 mg/dL 8  8  7    Creatinine 0.44 - 1.00 mg/dL 992   4.26   Sodium 135 - 145 mmol/L 138  136  138   Potassium 3.5 - 5.1 mmol/L 4.0  4.1  3.7   Chloride 98 - 111 mmol/L 105  102  105   CO2 22 - 32 mmol/L 25  27  23    Calcium 8.9 - 10.3 mg/dL 8.7  8.4  8.6   Total Protein 6.5 - 8.1 g/dL 6.7  6.8  6.6   Total Bilirubin 0.3 - 1.2 mg/dL 0.2  0.3  0.2    Alkaline Phos 38 - 126 U/L 135  153  176   AST 15 - 41 U/L 14  16  13    ALT 0 - 44 U/L 8  7  7      Imaging: No results found.  Assessment/Plan:   Principal Problem:   Disorganized schizophrenia (HCC) Active Problems:   Tibia/fibula fracture, right, closed, initial encounter   Patient Summary: Molly [name] is a 36 y.o. with a pertinent PMH of disorganized schizophrenia who was admitted for surgical repair of the right tibia/fibula fracture, hospital course complicated by psychosis refractory to antipsychotics.   Schizoaffective disorder Psychosis refractory to antipsychotics She is followed closely by psychiatry and they are titrating her medications. . On wait list for [facility]. Will monitor for clozapine adverse effects. -Continue clozapine 50 mg in the morning and 75 mg nightly -  Continue Zyprexa 5 mg in the morning and 15 mg nightly -continue amitiza and colace for drug-induced constipation -weekly CBC with diff -weekly EKG for QT interval monitoring  Fracture of right tibia and fibula Continue pain management. Will need outpatient Ortho follow-up. She is scratching at the wound sites of prior surgical sutures. Strongly advised patient to stop picking the wounds and continue wound care.  -continue wound dressing and management -Acetaminophen 1000 mg every 8 hours for pain -Continue PT/OT  Gastro esophageal reflux Nursing reports nausea over the past several days.  Patient denies nausea or vomiting.  On Protonix 20 mg daily. -Continue Protonix  TOC Currently IVC. Still on waiting list for [facility].   Diet: Normal IVF: None,None VTE:  Rivaroxaban Code: Full PT/OT recs: rolling walker.   Dispo: Anticipated discharge to  Inpatient psychiatric facility  pending availability.   Rana Snare, DO Internal Medicine Resident PGY-1 Please contact the on call pager after 5 pm and on weekends at [phone].
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
## 2021-12-01 NOTE — Plan of Care (Signed)
No acute events overnight.    Problem: Education: Goal: Knowledge of General Education information will improve Description: Including pain rating scale, medication(s)/side effects and non-pharmacologic comfort measures Outcome: Progressing   Problem: Health Behavior/Discharge Planning: Goal: Ability to manage health-related needs will improve Outcome: Progressing   Problem: Clinical Measurements: Goal: Ability to maintain clinical measurements within normal limits will improve Outcome: Progressing Goal: Respiratory complications will improve Outcome: Progressing Goal: Cardiovascular complication will be avoided Outcome: Progressing   Problem: Activity: Goal: Risk for activity intolerance will decrease Outcome: Progressing   Problem: Nutrition: Goal: Adequate nutrition will be maintained Outcome: Progressing   Problem: Coping: Goal: Level of anxiety will decrease Outcome: Progressing   Problem: Elimination: Goal: Will not experience complications related to bowel motility Outcome: Progressing Goal: Will not experience complications related to urinary retention Outcome: Progressing   Problem: Pain Managment: Goal: General experience of comfort will improve Outcome: Progressing   Problem: Safety: Goal: Ability to remain free from injury will improve Outcome: Progressing   Problem: Skin Integrity: Goal: Risk for impaired skin integrity will decrease Outcome: Progressing
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
## 2021-12-02 DIAGNOSIS — S82401A Unspecified fracture of shaft of right fibula, initial encounter for closed fracture: Secondary | ICD-10-CM | POA: Diagnosis not present

## 2021-12-02 DIAGNOSIS — S82201A Unspecified fracture of shaft of right tibia, initial encounter for closed fracture: Secondary | ICD-10-CM | POA: Diagnosis not present

## 2021-12-02 DIAGNOSIS — F259 Schizoaffective disorder, unspecified: Secondary | ICD-10-CM | POA: Diagnosis not present

## 2021-12-02 DIAGNOSIS — K219 Gastro-esophageal reflux disease without esophagitis: Secondary | ICD-10-CM | POA: Diagnosis not present

## 2021-12-02 LAB — CBC WITH DIFFERENTIAL/PLATELET
Abs Immature Granulocytes: 0.09 10*3/uL — ABNORMAL HIGH (ref 0.00–0.07)
Basophils Absolute: 0.1 10*3/uL (ref 0.0–0.1)
Basophils Relative: 1 %
Eosinophils Absolute: 0.2 10*3/uL (ref 0.0–0.5)
Eosinophils Relative: 2 %
HCT: 31.4 % — ABNORMAL LOW (ref 36.0–46.0)
Hemoglobin: 10 g/dL — ABNORMAL LOW (ref 12.0–15.0)
Immature Granulocytes: 1 %
Lymphocytes Relative: 33 %
Lymphs Abs: 3.6 10*3/uL (ref 0.7–4.0)
MCH: 27.2 pg (ref 26.0–34.0)
MCHC: 31.8 g/dL (ref 30.0–36.0)
MCV: 85.3 fL (ref 80.0–100.0)
Monocytes Absolute: 1.5 10*3/uL — ABNORMAL HIGH (ref 0.1–1.0)
Monocytes Relative: 14 %
Neutro Abs: 5.6 10*3/uL (ref 1.7–7.7)
Neutrophils Relative %: 49 %
Platelets: 318 10*3/uL (ref 150–400)
RBC: 3.68 MIL/uL — ABNORMAL LOW (ref 3.87–5.11)
RDW: 16.3 % — ABNORMAL HIGH (ref 11.5–15.5)
WBC: 11.1 10*3/uL — ABNORMAL HIGH (ref 4.0–10.5)
nRBC: 0 % (ref 0.0–0.2)

## 2021-12-02 MED ORDER — DIVALPROEX SODIUM 250 MG PO DR TAB
1250.0000 mg | DELAYED_RELEASE_TABLET | Freq: Two times a day (BID) | ORAL | Status: DC
Start: 2021-12-02 — End: 2021-12-03
  Administered 2021-12-02 – 2021-12-03 (×2): 1250 mg via ORAL
  Filled 2021-12-02 (×3): qty 5

## 2021-12-02 NOTE — TOC Progression Note (Signed)
Transition of Care [facility]) - Progression Note    Patient Details  Name: Molly [name] MRN: [id number] Date of Birth: 06/06/1985  Transition of Care [facility]) CM/SW Contact  Baldemar Lenis, [place] Phone Number: 12/02/2021, 10:28 AM  Clinical Narrative:   CS[facility] to ensure patient remains on the waitlist. CSW confirmed that patient is on waitlist. No bed available at this time. CSW to follow.    Expected Discharge Plan: Psychiatric Hospital Barriers to Discharge: Continued Medical Work up  Expected Discharge Plan and Services Expected Discharge Plan: Psychiatric Hospital In-house Referral: Clinical Social Work     Living arrangements for the past 2 months: Homeless                                       Social Determinants of Health (SDOH) Interventions    Readmission Risk Interventions     No data to display
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
## 2021-12-02 NOTE — Significant Event (Signed)
Pt was accepted to [facility] 12/03/21 and can arrive after 9am. She is medically stable. Improving with PT. She is ambulating well with a rolling walker.

## 2021-12-02 NOTE — Progress Notes (Signed)
Pt was accepted to [facility] 12/03/21; Bed Assignment [facility]    Pt meets inpatient criteria per Maryagnes Amos, FNP  Attending Physician will be Dr. Jola Babinski   Report can be called to: - [phone] or [phone]  Pt can arrive after tomorrow after 9:00am   Care Team notified: Juel Burrow. Rosario Adie, FNP, Lovell Sheehan, RN, Blenda Nicely, [name]  [place], LCSWA 12/02/2021 @ 11:58 AM

## 2021-12-02 NOTE — Progress Notes (Addendum)
HD#38 Subjective:   Summary: Ms. Bostrom reports no new complaints. She is still bothered by the wound dressings on her right leg.  Denies tenderness or swelling at wound sites. No dressings were present.  Overnight Events: none    Objective:  Vital signs in last 24 hours: Vitals:   11/30/21 2344 11/30/21 2355 12/01/21 2016 12/02/21 0812  BP: 111/73  (!) 120/104 (!) 160/135  Pulse: (!) 112 99 96 (!) 108  Resp: 18  20   Temp: 98.6 F (37 C)  98.5 F (36.9 C) 98.5 F (36.9 C)  TempSrc: Oral  Oral   SpO2: 100%  98% 98%  Weight:      Height:        Physical Exam:  Constitutional: patient was sleeping but arousable, in no acute distress HENT: normocephalic atraumatic MSK: normal bulk and tone Neurological: alert & oriented x 3 Extremities: healing wounds at surgical sites on right LE, wound near medial malleolus is healing with no drainage Psych: Pleasant, cooperative behavior  Filed Weights   10/25/21 0646 10/26/21 1140  Weight: 124 kg 124 kg     Intake/Output Summary (Last 24 hours) at 12/02/2021 1107 Last data filed at 12/01/2021 1320 Gross per 24 hour  Intake 40 ml  Output --  Net 40 ml   Net IO Since Admission: 26,337 mL [12/02/21 1107]  Pertinent Labs:    Latest Ref Rng & Units 11/27/2021   10:44 AM 11/21/2021    6:17 AM 11/13/2021    5:30 AM  CBC  WBC 4.0 - 10.5 K/uL 10.0  10.8  11.5   Hemoglobin 12.0 - 15.0 g/dL 52.8  41.3  9.8   Hematocrit 36.0 - 46.0 % 31.3  32.0  30.9   Platelets 150 - 400 K/uL 311  436  595        Latest Ref Rng & Units 11/27/2021   10:44 AM 11/21/2021    6:17 AM 11/13/2021    5:30 AM  CMP  Glucose 70 - 99 mg/dL 98  244  010   BUN 6 - 20 mg/dL 8  8  7    Creatinine 0.44 - 1.00 mg/dL  2.72  5.36   Sodium 135 - 145 mmol/L 138  136  138   Potassium 3.5 - 5.1 mmol/L 4.0  4.1  3.7   Chloride 98 - 111 mmol/L 105  102  105   CO2 22 - 32 mmol/L 25  27  23    Calcium 8.9 - 10.3 mg/dL 8.7  8.4  8.6   Total Protein 6.5 - 8.1 g/dL 6.7   6.8  6.6   Total Bilirubin 0.3 - 1.2 mg/dL 0.2  0.3  0.2   Alkaline Phos 38 - 126 U/L 135  153  176   AST 15 - 41 U/L 14  16  13    ALT 0 - 44 U/L 8  7  7      Imaging: No results found.  Assessment/Plan:   Principal Problem:   Disorganized schizophrenia (HCC) Active Problems:   Tibia/fibula fracture, right, closed, initial encounter   Patient Summary: Molly [name] is a 36 y.o. with a pertinent PMH of disorganized schizophrenia who was admitted for surgical repair of the right tibia/fibula fracture, hospital course complicated by psychosis refractory to antipsychotics.   Schizoaffective disorder Psychosis refractory to antipsychotics She is followed closely by psychiatry and they are titrating her medications. . On wait list for [facility]. Will monitor for clozapine adverse effects  given recent dose increase.  -Continue clozapine 50 mg in the morning and 75 mg nightly -Continue Zyprexa 5 mg in the morning and 15 mg nightly -Continue Depakote 1250 mg BID -continue amitiza and colace for drug-induced constipation -Clozaril level with weekly CBC on 8/4 -weekly CBC with diff (scheduled for 8/4) -weekly EKG for QT interval monitoring (scheduled for 8/4)  Fracture of right tibia and fibula Continue pain management. Will need outpatient Ortho follow-up. She is scratching at the wound sites of prior surgical sutures. Strongly advised patient to stop picking the wounds and continue wound care.  Wound sites today were improving, no tenderness or drainage noted. -continue wound dressing and management -Acetaminophen 1000 mg every 8 hours for pain -Continue PT/OT  Gastro esophageal reflux Nursing reports nausea over the past several days.  Patient denies nausea or vomiting.  On Protonix 20 mg daily. -Continue Protonix  TOC Currently IVC. Still on waiting list for [facility].   Diet: Normal IVF: None,None VTE:  Rivaroxaban Code: Full PT/OT recs: rolling  walker.   Dispo: Anticipated discharge to  Inpatient psychiatric facility  pending availability.   Rana Snare, DO Internal Medicine Resident PGY-1 Please contact the on call pager after 5 pm and on weekends at [phone].
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
## 2021-12-02 NOTE — TOC Transition Note (Signed)
Transition of Care [facility]) - CM/SW Discharge Note   Patient Details  Name: Molly [name] MRN: [id number] Date of Birth: 1985-08-06  Transition of [facility]) CM/SW Contact:  Baldemar Lenis, [name] Phone Number: 12/02/2021, 2:07 PM   Clinical Narrative:   CSW asked by psych to look into [facility] [place] [facility] [place] or [place] now that patient has improved. CSW sent referrals, and [facility] has offered the patient a bed for tomorrow. Patient will need RW, ordered through [employer]. CSW contacted [facility] for transportation, indicated that patient needed transport after 9 am. Transportation unable to give a definitive time on when they will arrive, but will call the nurses station upon arrival. Patient will need IVC paperwork and RW to transport with her.  Nurse to call report prior to transport to [phone] or [phone].    Final next level of care: Psychiatric Hospital Barriers to Discharge: Barriers Resolved   Patient Goals and CMS Choice        Discharge Placement                Patient to be transferred to facility by: [facility] Name of family member notified: Self Patient and family notified of of transfer: 12/02/21  Discharge Plan and Services In-house Referral: Clinical Social Work                                   Social Determinants of Health (SDOH) Interventions     Readmission Risk Interventions     No data to display

## 2021-12-03 ENCOUNTER — Other Ambulatory Visit (HOSPITAL_COMMUNITY): Payer: Self-pay

## 2021-12-03 DIAGNOSIS — S82401A Unspecified fracture of shaft of right fibula, initial encounter for closed fracture: Secondary | ICD-10-CM | POA: Diagnosis not present

## 2021-12-03 DIAGNOSIS — S82201A Unspecified fracture of shaft of right tibia, initial encounter for closed fracture: Secondary | ICD-10-CM | POA: Diagnosis not present

## 2021-12-03 DIAGNOSIS — K219 Gastro-esophageal reflux disease without esophagitis: Secondary | ICD-10-CM | POA: Diagnosis not present

## 2021-12-03 DIAGNOSIS — F201 Disorganized schizophrenia: Secondary | ICD-10-CM | POA: Diagnosis not present

## 2021-12-03 LAB — CLOZAPINE (CLOZARIL)
Clozapine Lvl: 143 ng/mL — ABNORMAL LOW (ref 350–600)
NorClozapine: 39 ng/mL
Total(Cloz+Norcloz): 182 ng/mL

## 2021-12-03 LAB — GLUCOSE, CAPILLARY: Glucose-Capillary: 135 mg/dL — ABNORMAL HIGH (ref 70–99)

## 2021-12-03 MED ORDER — CLOZAPINE 50 MG PO TABS
50.0000 mg | ORAL_TABLET | Freq: Every day | ORAL | 0 refills | Status: DC
  Filled 2021-12-03: qty 30, 30d supply, fill #0

## 2021-12-03 MED ORDER — ONDANSETRON 4 MG PO TBDP
4.0000 mg | ORAL_TABLET | Freq: Two times a day (BID) | ORAL | 0 refills | Status: DC | PRN
  Filled 2021-12-03: qty 20, 10d supply, fill #0

## 2021-12-03 MED ORDER — OLANZAPINE 10 MG PO TABS
10.0000 mg | ORAL_TABLET | Freq: Two times a day (BID) | ORAL | 0 refills | Status: DC | PRN
  Filled 2021-12-03: qty 30, 15d supply, fill #0

## 2021-12-03 MED ORDER — ASCORBIC ACID 500 MG PO TABS
500.0000 mg | ORAL_TABLET | Freq: Every day | ORAL | 0 refills | Status: DC
  Filled 2021-12-03: qty 30, 30d supply, fill #0

## 2021-12-03 MED ORDER — DIPHENHYDRAMINE HCL 25 MG PO TABS
50.0000 mg | ORAL_TABLET | Freq: Once | ORAL | 0 refills | Status: DC | PRN
  Filled 2021-12-03: qty 60, 30d supply, fill #0

## 2021-12-03 MED ORDER — DOCUSATE SODIUM 100 MG PO CAPS
100.0000 mg | ORAL_CAPSULE | Freq: Every day | ORAL | 0 refills | Status: DC
  Filled 2021-12-03: qty 10, 10d supply, fill #0

## 2021-12-03 MED ORDER — CLOZAPINE 25 MG PO TABS
75.0000 mg | ORAL_TABLET | Freq: Every day | ORAL | 0 refills | Status: DC
  Filled 2021-12-03: qty 30, 10d supply, fill #0

## 2021-12-03 MED ORDER — POLYETHYLENE GLYCOL 3350 17 G PO PACK
17.0000 g | PACK | Freq: Every day | ORAL | 0 refills | Status: DC
  Filled 2021-12-03: qty 14, 14d supply, fill #0

## 2021-12-03 MED ORDER — CLONAZEPAM 0.5 MG PO TABS: 0.5000 mg | ORAL_TABLET | Freq: Two times a day (BID) | ORAL | 0 refills | Status: DC

## 2021-12-03 MED ORDER — OLANZAPINE 15 MG PO TABS
15.0000 mg | ORAL_TABLET | Freq: Every day | ORAL | 0 refills | Status: DC
  Filled 2021-12-03: qty 30, 30d supply, fill #0

## 2021-12-03 MED ORDER — NICOTINE POLACRILEX 2 MG MT GUM
2.0000 mg | CHEWING_GUM | OROMUCOSAL | 0 refills | Status: DC | PRN
  Filled 2021-12-03: qty 100, fill #0

## 2021-12-03 MED ORDER — OLANZAPINE 10 MG IM SOLR
10.0000 mg | Freq: Two times a day (BID) | INTRAMUSCULAR | 0 refills | Status: DC | PRN
Start: 2021-12-03 — End: 2021-12-03
  Filled 2021-12-03: qty 1, 1d supply, fill #0

## 2021-12-03 MED ORDER — PANTOPRAZOLE SODIUM 20 MG PO TBEC
20.0000 mg | DELAYED_RELEASE_TABLET | Freq: Every day | ORAL | 0 refills | Status: DC
  Filled 2021-12-03: qty 30, 30d supply, fill #0

## 2021-12-03 MED ORDER — POLYETHYLENE GLYCOL 3350 17 GM/SCOOP PO POWD
17.0000 g | Freq: Every day | ORAL | 0 refills | Status: DC | PRN
  Filled 2021-12-03: qty 238, 14d supply, fill #0

## 2021-12-03 MED ORDER — OLANZAPINE 5 MG PO TABS
5.0000 mg | ORAL_TABLET | Freq: Every day | ORAL | 0 refills | Status: DC
  Filled 2021-12-03: qty 30, 30d supply, fill #0

## 2021-12-03 MED ORDER — NALOXEGOL OXALATE 12.5 MG PO TABS
ORAL_TABLET | Freq: Two times a day (BID) | ORAL | 0 refills | Status: DC
  Filled 2021-12-03: qty 30, 30d supply, fill #0

## 2021-12-03 MED ORDER — DIVALPROEX SODIUM 250 MG PO DR TAB
1250.0000 mg | DELAYED_RELEASE_TABLET | Freq: Two times a day (BID) | ORAL | 0 refills | Status: DC
  Filled 2021-12-03: qty 30, 3d supply, fill #0
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
## 2021-12-03 NOTE — Care Management Important Message (Signed)
Important Message  Patient Details  Name: Molly [name] MRN: [id number] Date of Birth: 08-23-1985   Medicare Important Message Given:  Other (see comment)     Sherilyn Banker 12/03/2021, 12:15 PM

## 2021-12-03 NOTE — Discharge Instructions (Addendum)
You were hospitalized for right tibia and fibula fracture and schizophrenia. Thank you for allowing [place] to be part of your care.    Please note these changes made to your medications:   Please START taking: Clozaril 50mg  in morning and 75mg  at night, Zyprexa 5mg  at morning and 15mg  at night and Depakote 1250 mg twice daily.  Continue Amitiza and MiraLAX for constipation.   Please call our clinic if you have any questions or concerns, we may be able to help and keep you from a long and expensive emergency room wait. Our clinic and after hours phone number is [phone], the best time to call is Monday through Friday 9 am to 4 pm but there is always someone available 24/7 if you have an emergency. If you need medication refills please notify your pharmacy one week in advance and they will send a request.

## 2021-12-03 NOTE — Progress Notes (Signed)
8/3 Im Letter mailed to pt's address on file. Initial IM Letter given to patient.

## 2021-12-03 NOTE — Plan of Care (Addendum)
Pt report given to [facility], no further questions. Pt walker taken with patient. Purse was not located prior to discharge. Pt not in acute distress.  Problem: Education: Goal: Knowledge of General Education information will improve Description: Including pain rating scale, medication(s)/side effects and non-pharmacologic comfort measures Outcome: Adequate for Discharge   Problem: Health Behavior/Discharge Planning: Goal: Ability to manage health-related needs will improve Outcome: Adequate for Discharge   Problem: Clinical Measurements: Goal: Ability to maintain clinical measurements within normal limits will improve Outcome: Adequate for Discharge Goal: Respiratory complications will improve Outcome: Adequate for Discharge Goal: Cardiovascular complication will be avoided Outcome: Adequate for Discharge   Problem: Activity: Goal: Risk for activity intolerance will decrease Outcome: Adequate for Discharge   Problem: Nutrition: Goal: Adequate nutrition will be maintained Outcome: Adequate for Discharge   Problem: Coping: Goal: Level of anxiety will decrease Outcome: Adequate for Discharge   Problem: Elimination: Goal: Will not experience complications related to bowel motility Outcome: Adequate for Discharge Goal: Will not experience complications related to urinary retention Outcome: Adequate for Discharge   Problem: Pain Managment: Goal: General experience of comfort will improve Outcome: Adequate for Discharge   Problem: Safety: Goal: Ability to remain free from injury will improve Outcome: Adequate for Discharge   Problem: Skin Integrity: Goal: Risk for impaired skin integrity will decrease Outcome: Adequate for Discharge

## 2021-12-16 ENCOUNTER — Emergency Department (HOSPITAL_COMMUNITY)
Admission: EM | Admit: 2021-12-16 | Discharge: 2021-12-16 | Disposition: A | Discharge status: Home / Self Care | Payer: 59 | Attending: Emergency Medicine | Admitting: Emergency Medicine

## 2021-12-16 DIAGNOSIS — M25561 Pain in right knee: Secondary | ICD-10-CM | POA: Insufficient documentation

## 2021-12-16 DIAGNOSIS — Z760 Encounter for issue of repeat prescription: Secondary | ICD-10-CM | POA: Insufficient documentation

## 2021-12-16 MED ORDER — ACETAMINOPHEN 325 MG PO TABS
650.0000 mg | ORAL_TABLET | Freq: Once | ORAL | Status: AC
Start: 2021-12-16 — End: 2021-12-16
  Administered 2021-12-16: 650 mg via ORAL
  Filled 2021-12-16: qty 2

## 2021-12-16 MED ORDER — OLANZAPINE 5 MG PO TABS
15.0000 mg | ORAL_TABLET | Freq: Every day | ORAL | Status: DC
  Administered 2021-12-16: 15 mg via ORAL
  Filled 2021-12-16: qty 1

## 2021-12-16 NOTE — Discharge Instructions (Signed)
Please continue to follow up in the outpatient setting for your psychiatric medications and needs.

## 2021-12-16 NOTE — ED Provider Notes (Signed)
[facility] EMERGENCY DEPARTMENT Provider Note   CSN: [id number] Arrival date & time: 12/16/21  0117     History  Chief Complaint  Patient presents with   Leg Pain    Molly [name] is a 36 y.o. female who presents via EMS with request for a place to sleep and shower as well as a dose of her psychiatric medicine.  States she has not had her medications for 3 days denies any SI or HI at this time.  Denies any acute complaint.  Does endorse right knee pain after recent surgery 1 month ago.  I personally reviewed medical records.  Initially met with that she has history of schizoaffective disorder, poor compliance with medications.  It appears she is post to be on Zyprexa daily. HPI     Home Medications Prior to Admission medications   Medication Sig Start Date End Date Taking? Authorizing Provider  ascorbic acid (VITAMIN C) 500 MG tablet Take 1 tablet (500 mg total) by mouth daily. 12/03/21   Angelique Blonder, DO  clonazePAM (KLONOPIN) 0.5 MG tablet Take 1 tablet (0.5 mg total) by mouth 2 (two) times daily. 12/03/21   Angelique Blonder, DO  cloZAPine (CLOZARIL) 25 MG tablet Take 3 tablets (75 mg total) by mouth at bedtime. 12/03/21   Angelique Blonder, DO  clozapine (CLOZARIL) 50 MG tablet Take 1 tablet (50 mg total) by mouth daily. 12/03/21   Angelique Blonder, DO  diphenhydrAMINE (BENADRYL) 25 MG tablet Take 2 tablets (50 mg total) by mouth once as needed for up to 1 dose (Agitation refractory to Zyprexa). 12/03/21   Angelique Blonder, DO  divalproex (DEPAKOTE) 250 MG DR tablet Take 5 tablets (1,250 mg total) by mouth every 12 (twelve) hours. 12/03/21   Angelique Blonder, DO  docusate sodium (COLACE) 100 MG capsule Take 1 capsule (100 mg total) by mouth daily. 12/03/21   Angelique Blonder, DO  naloxegol oxalate (MOVANTIK) 12.5 MG TABS tablet Take 1 tablet  by mouth 2 (two) times daily with a meal. 12/03/21   Angelique Blonder, DO  nicotine polacrilex (NICORETTE) 2 MG gum Take 1 each (2 mg total) by  mouth as needed for smoking cessation. 12/03/21   Angelique Blonder, DO  OLANZapine (ZYPREXA) 15 MG tablet Take 1 tablet (15 mg total) by mouth at bedtime. 12/03/21   Angelique Blonder, DO  OLANZapine (ZYPREXA) 5 MG tablet Take 1 tablet (5 mg total) by mouth daily. 12/03/21   Angelique Blonder, DO  OLANZapine (ZYPREXA) 10 MG tablet Take 1 tablet (10 mg total) by mouth 2 (two) times daily as needed (agitation and psychosis). 12/03/21   Angelique Blonder, DO  ondansetron (ZOFRAN-ODT) 4 MG disintegrating tablet Take 1 tablet (4 mg total) by mouth 2 (two) times daily as needed for nausea or vomiting. 12/03/21   Angelique Blonder, DO  pantoprazole (PROTONIX) 20 MG tablet Take 1 tablet (20 mg total) by mouth daily. 12/03/21   Angelique Blonder, DO  polyethylene glycol powder (GLYCOLAX/MIRALAX) 17 GM/SCOOP powder Take 17 g by mouth daily as needed for moderate constipation or severe constipation. 12/03/21   Angelique Blonder, DO  polyethylene glycol (MIRALAX / GLYCOLAX) 17 g packet Take 17 g by mouth daily. 12/03/21   Angelique Blonder, DO      Allergies    Abilify [aripiprazole]    Review of Systems   Review of Systems  Constitutional:        Medication dosage requested.     Physical Exam Updated Vital Signs BP 131/75 (BP  Location: Right Arm)   Pulse 98   Temp 98.1 F (36.7 C)   Resp 19   SpO2 100% Comment: Simultaneous filing. User may not have seen previous data. Physical Exam Vitals and nursing note reviewed.  Constitutional:      Appearance: She is obese. She is not ill-appearing or toxic-appearing.  HENT:     Head: Normocephalic and atraumatic.     Mouth/Throat:     Mouth: Mucous membranes are moist.     Pharynx: No oropharyngeal exudate or posterior oropharyngeal erythema.  Eyes:     General:        Right eye: No discharge.        Left eye: No discharge.     Extraocular Movements: Extraocular movements intact.     Conjunctiva/sclera: Conjunctivae normal.     Pupils: Pupils are equal, round, and reactive to light.   Cardiovascular:     Rate and Rhythm: Normal rate and regular rhythm.     Pulses: Normal pulses.     Heart sounds: Normal heart sounds. No murmur heard. Pulmonary:     Effort: Pulmonary effort is normal. No respiratory distress.     Breath sounds: Normal breath sounds. No wheezing or rales.  Abdominal:     General: There is no distension.     Palpations: Abdomen is soft.     Tenderness: There is no abdominal tenderness.  Musculoskeletal:        General: No deformity.     Cervical back: Neck supple.     Right lower leg: No edema.     Left lower leg: No edema.       Legs:  Skin:    General: Skin is warm and dry.     Capillary Refill: Capillary refill takes less than 2 seconds.  Neurological:     General: No focal deficit present.     Mental Status: She is alert and oriented to person, place, and time. Mental status is at baseline.  Psychiatric:        Mood and Affect: Mood normal.     ED Results / Procedures / Treatments   Labs (all labs ordered are listed, but only abnormal results are displayed) Labs Reviewed - No data to display  EKG None  Radiology No results found.  Procedures Procedures    Medications Ordered in ED Medications  OLANZapine (ZYPREXA) tablet 15 mg (15 mg Oral Given 12/16/21 0304)  acetaminophen (TYLENOL) tablet 650 mg (650 mg Oral Given 12/16/21 0305)    ED Course/ Medical Decision Making/ A&P Clinical Course as of 12/16/21 0501  Wed Dec 16, 2021  0256 Patient's legal guardian successfully notified and are agreeable to her discharge from our department tonight.  [RS]    Clinical Course User Index [RS] Lucus Lambertson, Gypsy Balsam, PA-C                           Medical Decision Making 36 year old female presents with request for dosage of her psychiatric medication in place to sleep and shower.  Vital signs are normal intake.  Cardiopulmonary exam is normal, abdominal exam is benign.  Patient is neurovascular intact in extremities.  Well-healing  scars in the right anterior knee with mild tenderness palpation without effusion, cellulitic changes, or deformity.  Patient denies SI or HI, does not appear responding to internal stimuli at this time.  Dose of Zyprexa administered in ED.  No report of near this time.  Risk OTC drugs.  Patient administered dose of psychiatric meds, states she has them ready to pick up from the pharmacy.  Legal guardian made aware of her presence in the ED, agree with plan to discharge at this time.  [place] voiced understanding of her medical evaluation and treatment plan. Each of their questions answered to their expressed satisfaction.  Return precautions were given.  Patient is well-appearing, stable, and was discharged in good condition..  This chart was dictated using voice recognition software, Dragon. Despite the best efforts of this provider to proofread and correct errors, errors may still occur which can change documentation meaning.  Final Clinical Impression(s) / ED Diagnoses Final diagnoses:  Encounter for medication refill    Rx / DC Orders ED Discharge Orders     None         Aura Dials 12/16/21 0501    Palumbo, April, MD 12/16/21 0502

## 2021-12-16 NOTE — ED Triage Notes (Signed)
Pt homeless, brought in by [facility] for bilateral leg pain. During triage, pt reports she just wants a bed, shower, and her psych meds. States she has been without meds x 3 days. Denies any SI/HI at this time.

## 2021-12-16 NOTE — ED Notes (Signed)
Patient verbalizes understanding of discharge instructions. Opportunity for questioning and answers were provided. Armband removed by staff, pt discharged from ED. Wheeled out to lobby, legal guardian notified and consented for discharge

## 2022-01-18 ENCOUNTER — Emergency Department (HOSPITAL_COMMUNITY)
Admission: EM | Admit: 2022-01-18 | Discharge: 2022-01-29 | Disposition: A | Discharge status: Home / Self Care | Payer: 59 | Attending: Emergency Medicine | Admitting: Emergency Medicine

## 2022-01-18 ENCOUNTER — Other Ambulatory Visit: Payer: Self-pay

## 2022-01-18 ENCOUNTER — Encounter (HOSPITAL_COMMUNITY): Payer: Self-pay

## 2022-01-18 DIAGNOSIS — R451 Restlessness and agitation: Secondary | ICD-10-CM | POA: Diagnosis not present

## 2022-01-18 DIAGNOSIS — I10 Essential (primary) hypertension: Secondary | ICD-10-CM | POA: Insufficient documentation

## 2022-01-18 DIAGNOSIS — G4700 Insomnia, unspecified: Secondary | ICD-10-CM | POA: Insufficient documentation

## 2022-01-18 DIAGNOSIS — F201 Disorganized schizophrenia: Secondary | ICD-10-CM | POA: Diagnosis present

## 2022-01-18 DIAGNOSIS — Z20822 Contact with and (suspected) exposure to covid-19: Secondary | ICD-10-CM | POA: Insufficient documentation

## 2022-01-18 DIAGNOSIS — F29 Unspecified psychosis not due to a substance or known physiological condition: Secondary | ICD-10-CM | POA: Diagnosis not present

## 2022-01-18 DIAGNOSIS — E119 Type 2 diabetes mellitus without complications: Secondary | ICD-10-CM | POA: Diagnosis not present

## 2022-01-18 DIAGNOSIS — F259 Schizoaffective disorder, unspecified: Secondary | ICD-10-CM | POA: Diagnosis present

## 2022-01-18 DIAGNOSIS — F17210 Nicotine dependence, cigarettes, uncomplicated: Secondary | ICD-10-CM | POA: Insufficient documentation

## 2022-01-18 DIAGNOSIS — R440 Auditory hallucinations: Secondary | ICD-10-CM | POA: Diagnosis not present

## 2022-01-18 DIAGNOSIS — R456 Violent behavior: Secondary | ICD-10-CM | POA: Diagnosis not present

## 2022-01-18 DIAGNOSIS — F419 Anxiety disorder, unspecified: Secondary | ICD-10-CM | POA: Insufficient documentation

## 2022-01-18 LAB — BASIC METABOLIC PANEL
Anion gap: 6 (ref 5–15)
BUN: 16 mg/dL (ref 6–20)
CO2: 23 mmol/L (ref 22–32)
Calcium: 8.9 mg/dL (ref 8.9–10.3)
Chloride: 111 mmol/L (ref 98–111)
Creatinine, Ser: 0.65 mg/dL (ref 0.44–1.00)
GFR, Estimated: >60 mL/min (ref >60)
Glucose, Bld: 102 mg/dL — ABNORMAL HIGH (ref 70–99)
Potassium: 3.9 mmol/L (ref 3.5–5.1)
Sodium: 140 mmol/L (ref 135–145)

## 2022-01-18 LAB — CBC WITH DIFFERENTIAL/PLATELET
Abs Immature Granulocytes: 0.05 10*3/uL (ref 0.00–0.07)
Basophils Absolute: 0.1 10*3/uL (ref 0.0–0.1)
Basophils Relative: 1 %
Eosinophils Absolute: 0.1 10*3/uL (ref 0.0–0.5)
Eosinophils Relative: 1 %
HCT: 36.1 % (ref 36.0–46.0)
Hemoglobin: 11.2 g/dL — ABNORMAL LOW (ref 12.0–15.0)
Immature Granulocytes: 0 %
Lymphocytes Relative: 24 %
Lymphs Abs: 3.1 10*3/uL (ref 0.7–4.0)
MCH: 27.1 pg (ref 26.0–34.0)
MCHC: 31 g/dL (ref 30.0–36.0)
MCV: 87.2 fL (ref 80.0–100.0)
Monocytes Absolute: 1.5 10*3/uL — ABNORMAL HIGH (ref 0.1–1.0)
Monocytes Relative: 12 %
Neutro Abs: 8.2 10*3/uL — ABNORMAL HIGH (ref 1.7–7.7)
Neutrophils Relative %: 62 %
Platelets: 325 10*3/uL (ref 150–400)
RBC: 4.14 MIL/uL (ref 3.87–5.11)
RDW: 16.8 % — ABNORMAL HIGH (ref 11.5–15.5)
WBC: 13 10*3/uL — ABNORMAL HIGH (ref 4.0–10.5)
nRBC: 0 % (ref 0.0–0.2)

## 2022-01-18 LAB — I-STAT BETA HCG BLOOD, ED (MC, WL, AP ONLY): I-stat hCG, quantitative: <5 m[IU]/mL (ref <5)

## 2022-01-18 LAB — ETHANOL: Alcohol, Ethyl (B): <10 mg/dL (ref <10)

## 2022-01-18 LAB — RESP PANEL BY RT-PCR (FLU A&B, COVID) ARPGX2
Influenza A by PCR: NEGATIVE
Influenza B by PCR: NEGATIVE
SARS Coronavirus 2 by RT PCR: NEGATIVE

## 2022-01-18 LAB — VALPROIC ACID LEVEL: Valproic Acid Lvl: <10 ug/mL — ABNORMAL LOW (ref 50.0–100.0)

## 2022-01-18 MED ORDER — LORAZEPAM 2 MG/ML IJ SOLN
2.0000 mg | Freq: Once | INTRAMUSCULAR | Status: AC
  Administered 2022-01-18: 2 mg via INTRAMUSCULAR
  Filled 2022-01-18: qty 1

## 2022-01-18 MED ORDER — STERILE WATER FOR INJECTION IJ SOLN
INTRAMUSCULAR | Status: AC
  Filled 2022-01-18: qty 10

## 2022-01-18 MED ORDER — ZIPRASIDONE MESYLATE 20 MG IM SOLR
20.0000 mg | Freq: Two times a day (BID) | INTRAMUSCULAR | Status: DC | PRN
  Administered 2022-01-18 – 2022-01-20 (×4): 20 mg via INTRAMUSCULAR
  Filled 2022-01-18 (×5): qty 20

## 2022-01-18 MED ORDER — ZIPRASIDONE MESYLATE 20 MG IM SOLR
20.0000 mg | Freq: Once | INTRAMUSCULAR | Status: AC
  Administered 2022-01-18: 20 mg via INTRAMUSCULAR
  Filled 2022-01-18: qty 20

## 2022-01-18 MED ORDER — DIVALPROEX SODIUM 500 MG PO DR TAB
500.0000 mg | DELAYED_RELEASE_TABLET | Freq: Two times a day (BID) | ORAL | Status: DC
  Administered 2022-01-18 – 2022-01-23 (×11): 500 mg via ORAL
  Filled 2022-01-18 (×12): qty 1

## 2022-01-18 MED ORDER — NICOTINE 21 MG/24HR TD PT24
21.0000 mg | MEDICATED_PATCH | Freq: Every day | TRANSDERMAL | Status: DC
  Administered 2022-01-19 – 2022-01-29 (×10): 21 mg via TRANSDERMAL
  Filled 2022-01-18 (×12): qty 1

## 2022-01-18 MED ORDER — HALOPERIDOL LACTATE 5 MG/ML IJ SOLN
10.0000 mg | Freq: Once | INTRAMUSCULAR | Status: AC
  Administered 2022-01-18: 10 mg via INTRAMUSCULAR
  Filled 2022-01-18: qty 2

## 2022-01-18 MED ORDER — OLANZAPINE 10 MG PO TBDP: 10.0000 mg | ORAL_TABLET | Freq: Every day | ORAL | Status: DC

## 2022-01-18 MED ORDER — OLANZAPINE 10 MG PO TBDP
10.0000 mg | ORAL_TABLET | Freq: Two times a day (BID) | ORAL | Status: DC
  Administered 2022-01-18 – 2022-01-29 (×22): 10 mg via ORAL
  Filled 2022-01-18 (×22): qty 1

## 2022-01-18 NOTE — ED Notes (Signed)
Pt walking through the halls looking into other patient's room, picking up belongings from the nurses station, asked patient to not mess with staff belongings and go back to room, medication given.

## 2022-01-18 NOTE — ED Notes (Signed)
Pt sleeping. 

## 2022-01-18 NOTE — ED Notes (Signed)
Pt in gown. 1 pt bag in cabinet designated to [place].

## 2022-01-18 NOTE — ED Notes (Signed)
Pt ivc'd 9/18 expires on 9/25

## 2022-01-18 NOTE — ED Notes (Signed)
Pt is refusing scrubs at this time. Pt stated she is a woman, a muslim woman and she will not get in scrubs. Pt stated she is bleeding from her vagina to get the F** away from her.

## 2022-01-18 NOTE — ED Notes (Signed)
Pt is refusing to change into a gown at this time.

## 2022-01-18 NOTE — ED Provider Notes (Signed)
[facility]-EMERGENCY DEPT Provider Note: Molly Dell, MD, FACEP  CSN: [id number] MRN: [id number] ARRIVAL: 01/18/22 at 0401 ROOM: WA24/WA24   CHIEF COMPLAINT  Insomnia  Level 5 caveat: Schizophrenic HISTORY OF PRESENT ILLNESS  01/18/22 4:12 AM Molly [name] is a 36 y.o. female schizophrenic who has not been taking her medications.  Consequently she has been anxious and had insomnia.  She cannot tell me what medications she takes or needs refills of.  She is having difficulty carrying on a conversation.  She gives inappropriate answers and often dozes off between questions.  She acknowledges auditory hallucinations but denies suicidal ideation or homicidal ideation.   Past Medical History:  Diagnosis Date   Abnormal Pap smear    ASC-cannot exclude HGSIL on Pap 02/15/2012   ASC-US on 02/03/2012 pap (associated Trichomonas infection). No reflex HPV testing performed on specimen.  Patient informed that she will need repeat Pap in one year.       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 Dennie Bible (Psych) At [facility] in  [place], [place]. 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   Qualifier: Diagnosis of  By: McDiarmid MD, Benjaman Pott USER 02/08/2009   Qualifier: Diagnosis of  By: Knox Royalty      Past Surgical History:  Procedure Laterality Date   INCISION AND DRAINAGE     pilanodal cyst   TIBIA IM NAIL INSERTION Right 10/26/2021    Procedure: INTRAMEDULLARY (IM) NAIL TIBIAL;  Surgeon: Myrene Galas, MD;  Location: [facility] OR;  Service: Orthopedics;  Laterality: Right;   TOOTH EXTRACTION N/A 10/07/2017   Procedure: EXTRACTION TEETH NUMBERS ONE, SEVENTEEN, NINETEEN AND THIRTY TWO;  Surgeon: Ocie Doyne, DDS;  Location: [facility] OR;  Service: Oral Surgery;  Laterality: N/A;    Family History  Adopted: Yes  Problem Relation Age of Onset   Bipolar disorder Sister    Alcohol abuse Brother    Cancer Father    Diabetes Mother     Social History   Tobacco Use   Smoking status: Every Day    Packs/day: 2.00    Years: 10.00    Total pack years: 20.00    Types: Cigarettes   Smokeless tobacco: Never  Vaping Use   Vaping Use: Never used  Substance Use Topics   Alcohol use: No    Comment: occ   Drug use: No    Prior to Admission medications   Medication Sig Start Date End Date Taking? Authorizing Provider  ascorbic acid (VITAMIN C) 500 MG tablet Take 1 tablet (500 mg total) by mouth daily. 12/03/21   Rana Snare, DO  clonazePAM (KLONOPIN) 0.5 MG tablet Take 1 tablet (0.5 mg total) by mouth 2 (two) times daily. 12/03/21   Rana Snare, DO  cloZAPine (CLOZARIL) 25 MG tablet Take 3 tablets (75 mg total) by mouth at  bedtime. 12/03/21   Rana Snare, DO  clozapine (CLOZARIL) 50 MG tablet Take 1 tablet (50 mg total) by mouth daily. 12/03/21   Rana Snare, DO  diphenhydrAMINE (BENADRYL) 25 MG tablet Take 2 tablets (50 mg total) by mouth once as needed for up to 1 dose (Agitation refractory to Zyprexa). 12/03/21   Rana Snare, DO  divalproex (DEPAKOTE) 250 MG DR tablet Take 5 tablets (1,250 mg total) by mouth every 12 (twelve) hours. 12/03/21   Rana Snare, DO  docusate sodium (COLACE) 100 MG capsule Take 1 capsule (100 mg total) by mouth daily. 12/03/21   Rana Snare, DO  naloxegol oxalate (MOVANTIK) 12.5 MG TABS tablet Take 1 tablet  by mouth 2 (two) times daily with a meal. 12/03/21   Rana Snare, DO  nicotine polacrilex  (NICORETTE) 2 MG gum Take 1 each (2 mg total) by mouth as needed for smoking cessation. 12/03/21   Rana Snare, DO  OLANZapine (ZYPREXA) 15 MG tablet Take 1 tablet (15 mg total) by mouth at bedtime. 12/03/21   Rana Snare, DO  OLANZapine (ZYPREXA) 5 MG tablet Take 1 tablet (5 mg total) by mouth daily. 12/03/21   Rana Snare, DO  OLANZapine (ZYPREXA) 10 MG tablet Take 1 tablet (10 mg total) by mouth 2 (two) times daily as needed (agitation and psychosis). 12/03/21   Rana Snare, DO  ondansetron (ZOFRAN-ODT) 4 MG disintegrating tablet Take 1 tablet (4 mg total) by mouth 2 (two) times daily as needed for nausea or vomiting. 12/03/21   Rana Snare, DO  pantoprazole (PROTONIX) 20 MG tablet Take 1 tablet (20 mg total) by mouth daily. 12/03/21   Rana Snare, DO  polyethylene glycol powder (GLYCOLAX/MIRALAX) 17 GM/SCOOP powder Take 17 g by mouth daily as needed for moderate constipation or severe constipation. 12/03/21   Rana Snare, DO  polyethylene glycol (MIRALAX / GLYCOLAX) 17 g packet Take 17 g by mouth daily. 12/03/21   Rana Snare, DO    Allergies Abilify [aripiprazole]   REVIEW OF SYSTEMS  Level 5 caveat   PHYSICAL EXAMINATION  Initial Vital Signs Blood pressure (!) 149/92, pulse 98, temperature 98.1 F (36.7 C), temperature source Oral, resp. rate 19, SpO2 97 %, unknown if currently breastfeeding.  Examination General: Well-developed, well-nourished female in no acute distress; appearance consistent with age of record HENT: normocephalic; atraumatic Eyes: pupils equal, round and reactive to light; extraocular muscles grossly intact Neck: supple Heart: regular rate and rhythm Lungs: clear to auscultation bilaterally Abdomen: soft; nondistended; nontender; bowel sounds present Extremities: No deformity; full range of motion; pulses normal Neurologic: Awake, alert but sometimes dozes off; motor function intact in all extremities and symmetric; no facial droop Skin: Warm and  dry Psychiatric: Inappropriate answers to questions; no SI; no HI   RESULTS  Summary of this visit's results, reviewed and interpreted by myself:   EKG Interpretation  Date/Time:    Ventricular Rate:    PR Interval:    QRS Duration:   QT Interval:    QTC Calculation:   R Axis:     Text Interpretation:         Laboratory Studies: Results for orders placed or performed during the hospital encounter of 01/18/22 (from the past 24 hour(s))  CBC with Differential     Status: Abnormal   Collection Time: 01/18/22  4:21 AM  Result Value Ref Range   WBC 13.0 (H) 4.0 - 10.5 K/uL   RBC 4.14 3.87 - 5.11 MIL/uL   Hemoglobin 11.2 (L) 12.0 -  15.0 g/dL   HCT 16.136.1 09.636.0 - 04.546.0 %   MCV 87.2 80.0 - 100.0 fL   MCH 27.1 26.0 - 34.0 pg   MCHC 31.0 30.0 - 36.0 g/dL   RDW 40.916.8 (H) 81.111.5 - 91.415.5 %   Platelets 325 150 - 400 K/uL   nRBC 0.0 0.0 - 0.2 %   Neutrophils Relative % 62 %   Neutro Abs 8.2 (H) 1.7 - 7.7 K/uL   Lymphocytes Relative 24 %   Lymphs Abs 3.1 0.7 - 4.0 K/uL   Monocytes Relative 12 %   Monocytes Absolute 1.5 (H) 0.1 - 1.0 K/uL   Eosinophils Relative 1 %   Eosinophils Absolute 0.1 0.0 - 0.5 K/uL   Basophils Relative 1 %   Basophils Absolute 0.1 0.0 - 0.1 K/uL   Immature Granulocytes 0 %   Abs Immature Granulocytes 0.05 0.00 - 0.07 K/uL  Basic metabolic panel     Status: Abnormal   Collection Time: 01/18/22  4:21 AM  Result Value Ref Range   Sodium 140 135 - 145 mmol/L   Potassium 3.9 3.5 - 5.1 mmol/L   Chloride 111 98 - 111 mmol/L   CO2 23 22 - 32 mmol/L   Glucose, Bld 102 (H) 70 - 99 mg/dL   BUN 16 6 - 20 mg/dL   Creatinine, Ser 7.820.65 0.44 - 1.00 mg/dL   Calcium 8.9 8.9 - 95.610.3 mg/dL   GFR, Estimated >21>60 >30>60 mL/min   Anion gap 6 5 - 15  Ethanol     Status: None   Collection Time: 01/18/22  4:21 AM  Result Value Ref Range   Alcohol, Ethyl (B) <10 <10 mg/dL  Valproic acid level     Status: Abnormal   Collection Time: 01/18/22  4:21 AM  Result Value Ref Range    Valproic Acid Lvl <10 (L) 50.0 - 100.0 ug/mL  Resp Panel by RT-PCR (Flu A&B, Covid) Anterior Nasal Swab     Status: None   Collection Time: 01/18/22  4:25 AM   Specimen: Anterior Nasal Swab  Result Value Ref Range   SARS Coronavirus 2 by RT PCR NEGATIVE NEGATIVE   Influenza A by PCR NEGATIVE NEGATIVE   Influenza B by PCR NEGATIVE NEGATIVE  I-Stat Beta hCG blood, ED ([facility], [facility], [facility] only)     Status: None   Collection Time: 01/18/22  5:38 AM  Result Value Ref Range   I-stat hCG, quantitative <5.0 <5 mIU/mL   Comment 3           Imaging Studies: No results found.  ED COURSE and MDM  Nursing notes, initial and subsequent vitals signs, including pulse oximetry, reviewed and interpreted by myself.  Vitals:   01/18/22 0410 01/18/22 0411  BP: (!) 149/92   Pulse: 98   Resp: 19   Temp: 98.1 F (36.7 C)   TempSrc: Oral   SpO2: 100% 97%   Medications  nicotine (NICODERM CQ - dosed in mg/24 hours) patch 21 mg (has no administration in time range)  ziprasidone (GEODON) injection 20 mg (20 mg Intramuscular Given 01/18/22 0558)  sterile water (preservative free) injection (  Given 01/18/22 0559)    4:27 AM We will have the pharmacy technician review the patient's medications as it is unclear what she is supposed to be taking.  There are multiple duplicates in her home medication list.  We will have TTS assess the patient as she is not currently in a coherent state of mind although does not appear  an active danger to herself or others at this time.  5:46 AM Patient acting agitated and potentially violent.  Geodon 20 mg IM ordered.  Will fill out IVC papers as she is now clearly a threat to staff.  6:07 AM IVC papers completed.  6:25 AM Sleeping peacefully after Geodon and placement of soft restraints.  PROCEDURES  Procedures CRITICAL CARE Performed by: Karen Chafe Reena Borromeo Total critical care time: 30 minutes Critical care time was exclusive of separately billable procedures and treating other  patients. Critical care was necessary to treat or prevent imminent or life-threatening deterioration. Critical care was time spent personally by me on the following activities: development of treatment plan with patient and/or surrogate as well as nursing, discussions with consultants, evaluation of patient's response to treatment, examination of patient, obtaining history from patient or surrogate, ordering and performing treatments and interventions, ordering and review of laboratory studies, ordering and review of radiographic studies, pulse oximetry and re-evaluation of patient's condition.   ED DIAGNOSES     ICD-10-CM   1. Violent behavior  R45.6     2. Anxiety with agitation  F41.9    R45.1          Chao Blazejewski, Jenny Reichmann, MD 01/18/22 [phone]

## 2022-01-18 NOTE — ED Provider Notes (Signed)
Emergency Medicine Observation Re-evaluation Note  Molly [name] is a 36 y.o. female, seen on rounds today.  Pt initially presented to the ED for complaints of Insomnia  The patient was evaluated by psychiatry and deemed to meet inpatient criteria for admission.She is status post Geodon at 0600 this AM.  Plan  Current plan is for patient psychiatric admission.  Patient is currently under IVC.    Regan Lemming, MD 01/18/22 1147

## 2022-01-18 NOTE — BH Assessment (Signed)
Clinician messaged Annita Brod. Fevrier, RN: "Hey. It's Trey with TTS. Is the pt able to engage in the assessment, if so the pt will need to be placed in a private room. Is the pt under IVC? Also is the pt medically cleared?"    Clinician awaiting response.    Vertell Novak, [place], [facility], [facility] Triage Specialist [phone]

## 2022-01-18 NOTE — Consult Note (Signed)
Patient was pacing in the hallways, unable to be verbally redirected back to her room. I attempted to speak with her while she was walking down the hallways. She was mumbling to herself, looking around the walls and at the ceiling, she had a frightened look on her face. I attempted to talk to her multiple times but she continued to [facility] and would not respond to me and continued to walk away from her room. She received Geodon 20 mg IM at 0558 today and that showed little relief to her symptoms according to RN staff. Will order 10 mg Haldol and 2 mg Ativan IM once. ED RN staff notified.   After Chart review it appears patient was previously prescribed Clozaril, Depakote, and Zyprexa and has been off her medications for at least 1 month. Will go ahead and recommend IP treatment and notify [name]. Will attempt to reassess her again today and contact collateral if possible.

## 2022-01-18 NOTE — ED Notes (Signed)
Pt ambulatory to nurses station where she went to 9-12 nurse's area, picked up the nurses half-full energy drink and proceeded to drink the last of it. Pt instructed not to touch other peoples belongings and/or food/drink. Pt then picked up the nurses sunglasses and put them on. Sunglasses retrieved from pt. Pt redirected to room. Pt's primary RN notified.

## 2022-01-18 NOTE — ED Notes (Signed)
Pt instructed to provide urine sample. Hat was placed in the toilet. Pt seen exiting bathroom and dirty hat had been placed on the cabinet in the bathroom. Unable to obtain sample at this time.

## 2022-01-18 NOTE — ED Notes (Signed)
Pt had labs drawn. Before leaving pt room, call light was introduced. Pt requested the door be closed and light off. Pt soon after was heard screaming " I'm suffocating" upon entering pt room, pt became agitated and restless screaming "get the fuck out and leave me alone". Pt verbalizing threats. Pt fighting with staff. Medication intervention was used. MD order (see MAR).

## 2022-01-18 NOTE — ED Notes (Signed)
Pt came out of room to nurse station asking to talk to doctor. Sat in the recliner at the hall bed. Directed back to room by security

## 2022-01-18 NOTE — BH Assessment (Signed)
Per Annita Brod Fevrier, RN: "pt is not IVC and we are getting labs now. she does respond but goes in and out." Clinician asked RN to notify TTS once the pt is fully able engage in assessment.    Vertell Novak, [place], [facility], [facility] Triage Specialist [phone]

## 2022-01-18 NOTE — ED Triage Notes (Addendum)
Pt BIB [facility] from home. Pt reports not taking their meds. Pt c/o HI. Pt not combative.

## 2022-01-18 NOTE — ED Notes (Signed)
Pt coming out of room to nurse's station and picking up equipment. Directed back to room. Pt returned to room and heard screaming in the room.

## 2022-01-18 NOTE — ED Triage Notes (Signed)
Hallucinations. No homicidal ideation

## 2022-01-18 NOTE — Consult Note (Cosign Needed Addendum)
[facility] ED ASSESSMENT   Reason for Consult:  Psychosis Referring Physician:  Dr. 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 female patient brought in by EMS with history of schizophrenia, noncompliant with medications for unknown amount of time, unable to engage in coherent conversation, not sleeping well, and unknown current substance use.  She is currently homeless and has a legal guardian through [name].  She is closely followed by an act team.  Subjective:   Per my note from this morning around 1130 "Patient was pacing in the hallways, unable to be verbally redirected back to her room. I attempted to speak with her while she was walking down the hallways. She was mumbling to herself, looking around the walls and at the ceiling, she had a frightened look on her face. I attempted to talk to her multiple times but she continued to [facility] and would not respond to me and continued to walk away from her room. She received Geodon 20 mg IM at 0558 today and that showed little relief to her symptoms according to RN staff. Will order 10 mg Haldol and 2 mg Ativan IM once. ED RN staff notified.   After Chart review it appears patient was previously prescribed Clozaril, Depakote, and Zyprexa and has been off her medications for at least 1 month. Will go ahead and recommend IP treatment and notify [name]. Will attempt to reassess her again today and contact collateral if possible."  I attempted to see this patient again around 1600.  She was asleep in her room however ED staff reports she has been intermittently sleeping and continuing to wake up discharge focused, uncooperative, and mostly incoherent.  I was able to wake  the patient up and she was able to minimally engage in conversation with me.  She often fell asleep during our assessment and I would have to wake her up and repeat my questions.  Ultimately she tells me she is experiencing auditory hallucinations but will not elaborate on what she is hearing.  She denies suicidal or homicidal ideations.  I attempted to ask her about her legal guardian, ACT team, and how long she has been off her medications but she only mumbled and was not able to give me direct answers.  It was reported patient has not been sleeping well so I decided to terminate assessment that way she could continue to sleep.  I did get in touch with her legal guardian through [name], Molly [name], at [phone].  She tells me she took over guardianship in 2017.  She does have a long history of schizophrenia and has had multiple inpatient psychiatric admissions.  Patient recently was switched to [name]. Molly [name] tells me they find the patient once a week and give her a weeks worth of medications so she is unsure if she is actually taking them throughout the week or not. Molly [name] describes her baseline is pleasant, clear, able to engage in conversation, and more reasonable and logical.  She she tells me she recently went to [name] in early August and she feels like she was discharged way too early.  She tells me Molly [name] was calling her making delusional and paranoid statements such as requesting Molly [name] to  use her magical powers to unlock the doors and release her from the hospital during her stay at [name].  [name] released her after 5 days of inpatient treatment. Molly [name] tells me patient does have a history of medication noncompliance and was previously stabilized on Depakote, Zyprexa, and Clozaril.  However she tells me the ACT team was in the works of discontinuing her Clozaril and weaning her off of it due to her noncompliance with blood work and the suspected noncompliance with  medication.  She is unsure if patient is using any illicit substances.  She tells me patient is homeless, and she refuses to go to any shelters or any living situations they propose.  Example they recently had gotten her a low income housing apartment in [name] but patient refused to go to the apartment and continued to be homeless.  Molly [name] mentions she had a stay at [name] for a few months and when she was released was the clearest she had seen her since taking over custody.  He is hoping [facility] can [facility].  She tells me Molly [name] has no family that she is aware of.  She is agreeable with me restarting Zyprexa and Depakote at this time.  Patient has had significant decompensation due to medication noncompliance.  Patient continues to meet criteria for IVC and inpatient psychiatric treatment.  Past Psychiatric History:  History significant for schizophrenia versus schizoaffective, homelessness, and noncompliance with medications.  Risk to Self or Others: Is the patient at risk to self? Yes Has the patient been a risk to self in the past 6 months? No Has the patient been a risk to self within the distant past? No Is the patient a risk to others? Yes Has the patient been a risk to others in the past 6 months? No Has the patient been a risk to others within the distant past? No  [place] Scale:  Flowsheet Row ED from 01/18/2022 in [name] to Hosp-Admission (Discharged) from 10/25/2021 in [name] from 10/23/2021 in [name]  C-SSRS RISK CATEGORY No Risk No Risk No Risk       Past Medical History:  Past Medical History:  Diagnosis Date   Abnormal Pap smear    ASC-cannot exclude HGSIL on Pap 02/15/2012   ASC-US on 02/03/2012 pap (associated Trichomonas infection). No reflex HPV testing performed on specimen.  Patient informed that  she will need repeat Pap in one year.       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 Dennie Bible (Psych) At [facility] in  [place], [place]. 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   Qualifier: Diagnosis of  By: McDiarmid MD, Benjaman Pott USER 02/08/2009   Qualifier: Diagnosis of  By: Ilean China  Junie Panning      Past Surgical History:  Procedure Laterality Date   INCISION AND DRAINAGE     pilanodal cyst   TIBIA IM NAIL INSERTION Right 10/26/2021   Procedure: INTRAMEDULLARY (IM) NAIL TIBIAL;  Surgeon: Altamese Sawmill, MD;  Location: [facility];  Service: Orthopedics;  Laterality: Right;   TOOTH EXTRACTION N/A 10/07/2017   Procedure: EXTRACTION TEETH NUMBERS ONE, SEVENTEEN, NINETEEN AND THIRTY TWO;  Surgeon: Diona Browner, DDS;  Location: [facility];  Service: Oral Surgery;  Laterality: N/A;   Family History:  Family History  Adopted: Yes  Problem Relation Age of Onset   Bipolar disorder Sister    Alcohol abuse Brother    Cancer Father    Diabetes Mother    Social History:  Social History   Substance and Sexual Activity  Alcohol Use No   Comment: occ     Social History   Substance and Sexual Activity  Drug Use No    Social History   Socioeconomic History   Marital status: Single    Spouse name: Not on file   Number of children: Not on file   Years of education: Not on file   Highest education level: Not on file  Occupational History   Not on file   Tobacco Use   Smoking status: Every Day    Packs/day: 2.00    Years: 10.00    Total pack years: 20.00    Types: Cigarettes   Smokeless tobacco: Never  Vaping Use   Vaping Use: Never used  Substance and Sexual Activity   Alcohol use: No    Comment: occ   Drug use: No   Sexual activity: Yes    Birth control/protection: Injection  Other Topics Concern   Not on file  Social History Narrative   Adopted   Has Guardian   Living in [place] home with [name]   Transportation: Bus   Social Determinants of Health   Financial Resource Strain: Not on file  Food Insecurity: Not on file  Transportation Needs: Not on file  Physical Activity: Not on file  Stress: Not on file  Social Connections: Not on file    Allergies:   Allergies  Allergen Reactions   Abilify [Aripiprazole] Other (See Comments)    Thinks it's nasty- does not want it.  Injection is ok.      Labs:  Results for orders placed or performed during the hospital encounter of 01/18/22 (from the past 48 hour(s))  CBC with Differential     Status: Abnormal   Collection Time: 01/18/22  4:21 AM  Result Value Ref Range   WBC 13.0 (H) 4.0 - 10.5 K/uL   RBC 4.14 3.87 - 5.11 MIL/uL   Hemoglobin 11.2 (L) 12.0 - 15.0 g/dL   HCT 36.1 36.0 - 46.0 %   MCV 87.2 80.0 - 100.0 fL   MCH 27.1 26.0 - 34.0 pg   MCHC 31.0 30.0 - 36.0 g/dL   RDW 16.8 (H) 11.5 - 15.5 %   Platelets 325 150 - 400 K/uL   nRBC 0.0 0.0 - 0.2 %   Neutrophils Relative % 62 %   Neutro Abs 8.2 (H) 1.7 - 7.7 K/uL   Lymphocytes Relative 24 %   Lymphs Abs 3.1 0.7 - 4.0 K/uL   Monocytes Relative 12 %   Monocytes Absolute 1.5 (H) 0.1 - 1.0 K/uL   Eosinophils Relative 1 %   Eosinophils Absolute 0.1 0.0 - 0.5 K/uL   Basophils Relative 1 %  Basophils Absolute 0.1 0.0 - 0.1 K/uL   Immature Granulocytes 0 %   Abs Immature Granulocytes 0.05 0.00 - 0.07 K/uL    Comment: Performed at [facility], [address] [place], [place] [address]   Basic metabolic panel     Status: Abnormal   Collection Time: 01/18/22  4:21 AM  Result Value Ref Range   Sodium 140 135 - 145 mmol/L   Potassium 3.9 3.5 - 5.1 mmol/L   Chloride 111 98 - 111 mmol/L   CO2 23 22 - 32 mmol/L   Glucose, Bld 102 (H) 70 - 99 mg/dL    Comment: Glucose reference range applies only to samples taken after fasting for at least 8 hours.   BUN 16 6 - 20 mg/dL   Creatinine, Ser 6.040.65 0.44 - 1.00 mg/dL   Calcium 8.9 8.9 - 54.010.3 mg/dL   GFR, Estimated >98>60 >11>60 mL/min    Comment: (NOTE) Calculated using the CKD-EPI Creatinine Equation (2021)    Anion gap 6 5 - 15    Comment: Performed at [facility], [address] [place], [place] [address]  Ethanol     Status: None   Collection Time: 01/18/22  4:21 AM  Result Value Ref Range   Alcohol, Ethyl (B) <10 <10 mg/dL    Comment: (NOTE) Lowest detectable limit for serum alcohol is 10 mg/dL.  For medical purposes only. Performed at [facility], [address] [place], [place] [address]   Valproic acid level     Status: Abnormal   Collection Time: 01/18/22  4:21 AM  Result Value Ref Range   Valproic Acid Lvl <10 (L) 50.0 - 100.0 ug/mL    Comment: RESULT CONFIRMED BY MANUAL DILUTION Performed at [facility], [address] [place], [place] [address]   Resp Panel by RT-PCR (Flu A&B, Covid) Anterior Nasal Swab     Status: None   Collection Time: 01/18/22  4:25 AM   Specimen: Anterior Nasal Swab  Result Value Ref Range   SARS Coronavirus 2 by RT PCR NEGATIVE NEGATIVE    Comment: (NOTE) SARS-CoV-2 target nucleic acids are NOT DETECTED.  The SARS-CoV-2 RNA is generally detectable in upper respiratory specimens during the acute phase of infection. The lowest concentration of SARS-CoV-2 viral copies this assay can detect is 138 copies/mL. A negative result does not preclude SARS-Cov-2 infection and should not be used as the sole basis for treatment or other  patient management decisions. A negative result may occur with  improper specimen collection/handling, submission of specimen other than nasopharyngeal swab, presence of viral mutation(s) within the areas targeted by this assay, and inadequate number of viral copies(<138 copies/mL). A negative result must be combined with clinical observations, patient history, and epidemiological information. The expected result is Negative.  Fact Sheet for Patients:  BloggerCourse.comhttps://www.fda.gov/media/152166/download  Fact Sheet for Healthcare Providers:  SeriousBroker.ithttps://www.fda.gov/media/152162/download  This test is no t yet approved or cleared by the [place] FDA and  has been authorized for detection and/or diagnosis of SARS-CoV-2 by FDA under an Emergency Use Authorization (EUA). This EUA will remain  in effect (meaning this test can be used) for the duration of the COVID-19 declaration under Section 564(b)(1) of the Act, 21 U.S.C.section 360bbb-3(b)(1), unless the authorization is terminated  or revoked sooner.       Influenza A by PCR NEGATIVE NEGATIVE   Influenza B by PCR NEGATIVE NEGATIVE    Comment: (NOTE) The Xpert Xpress SARS-CoV-2/FLU/RSV plus assay  is intended as an aid in the diagnosis of influenza from Nasopharyngeal swab specimens and should not be used as a sole basis for treatment. Nasal washings and aspirates are unacceptable for Xpert Xpress SARS-CoV-2/FLU/RSV testing.  Fact Sheet for Patients: BloggerCourse.com  Fact Sheet for Healthcare Providers: SeriousBroker.it  This test is not yet approved or cleared by the [place] FDA and has been authorized for detection and/or diagnosis of SARS-CoV-2 by FDA under an Emergency Use Authorization (EUA). This EUA will remain in effect (meaning this test can be used) for the duration of the COVID-19 declaration under Section 564(b)(1) of the Act, 21 U.S.C. section 360bbb-3(b)(1), unless  the authorization is terminated or revoked.  Performed at [facility], [address] [place], [place] [address]   I-Stat Beta hCG blood, ED ([facility], [facility], [facility] only)     Status: None   Collection Time: 01/18/22  5:38 AM  Result Value Ref Range   I-stat hCG, quantitative <5.0 <5 mIU/mL   Comment 3            Comment:   GEST. AGE      CONC.  (mIU/mL)   <=1 WEEK        5 - 50     2 WEEKS       50 - 500     3 WEEKS       100 - 10,000     4 WEEKS     1,000 - 30,000        FEMALE AND NON-PREGNANT FEMALE:     LESS THAN 5 mIU/mL     Current Facility-Administered Medications  Medication Dose Route Frequency Provider Last Rate Last Admin   divalproex (DEPAKOTE) DR tablet 500 mg  500 mg Oral BID Eligha Bridegroom, NP       nicotine (NICODERM CQ - dosed in mg/24 hours) patch 21 mg  21 mg Transdermal Daily Molpus, John, MD       OLANZapine zydis (ZYPREXA) disintegrating tablet 10 mg  10 mg Oral BID Eligha Bridegroom, NP       Current Outpatient Medications  Medication Sig Dispense Refill   cloZAPine (CLOZARIL) 100 MG tablet Take 100 mg by mouth at bedtime. Taking with 50 mg tablet = 150 mg at bedtime     clozapine (CLOZARIL) 50 MG tablet Take 50 mg by mouth at bedtime. Taking with the 100 mg = 150 mg at bedtime     divalproex (DEPAKOTE ER) 500 MG 24 hr tablet Take 1,000 mg by mouth 2 (two) times daily.     OLANZapine (ZYPREXA) 20 MG tablet Take 20 mg by mouth at bedtime.     ascorbic acid (VITAMIN C) 500 MG tablet Take 1 tablet (500 mg total) by mouth daily. (Patient not taking: Reported on 01/18/2022) 30 tablet 0   clonazePAM (KLONOPIN) 0.5 MG tablet Take 1 tablet (0.5 mg total) by mouth 2 (two) times daily. 30 tablet 0   cloZAPine (CLOZARIL) 25 MG tablet Take 3 tablets (75 mg total) by mouth at bedtime. (Patient not taking: Reported on 01/18/2022) 30 tablet 0   clozapine (CLOZARIL) 50 MG tablet Take 1 tablet (50 mg total) by mouth daily. (Patient not taking: Reported on 01/18/2022)  30 tablet 0   diphenhydrAMINE (BENADRYL) 25 MG tablet Take 2 tablets (50 mg total) by mouth once as needed for up to 1 dose (Agitation refractory to Zyprexa). (Patient not taking: Reported on 01/18/2022) 60 tablet 0   divalproex (DEPAKOTE) 250 MG DR  tablet Take 5 tablets (1,250 mg total) by mouth every 12 (twelve) hours. (Patient not taking: Reported on 01/18/2022) 30 tablet 0   docusate sodium (COLACE) 100 MG capsule Take 1 capsule (100 mg total) by mouth daily. (Patient not taking: Reported on 01/18/2022) 10 capsule 0   naloxegol oxalate (MOVANTIK) 12.5 MG TABS tablet Take 1 tablet  by mouth 2 (two) times daily with a meal. (Patient not taking: Reported on 01/18/2022) 30 tablet 0   nicotine polacrilex (NICORETTE) 2 MG gum Take 1 each (2 mg total) by mouth as needed for smoking cessation. (Patient not taking: Reported on 01/18/2022) 100 tablet 0   OLANZapine (ZYPREXA) 15 MG tablet Take 1 tablet (15 mg total) by mouth at bedtime. (Patient not taking: Reported on 01/18/2022) 30 tablet 0   OLANZapine (ZYPREXA) 5 MG tablet Take 1 tablet (5 mg total) by mouth daily. (Patient not taking: Reported on 01/18/2022) 30 tablet 0   OLANZapine (ZYPREXA) 10 MG tablet Take 1 tablet (10 mg total) by mouth 2 (two) times daily as needed (agitation and psychosis). (Patient not taking: Reported on 01/18/2022) 30 tablet 0   ondansetron (ZOFRAN-ODT) 4 MG disintegrating tablet Take 1 tablet (4 mg total) by mouth 2 (two) times daily as needed for nausea or vomiting. (Patient not taking: Reported on 01/18/2022) 20 tablet 0   paliperidone (INVEGA SUSTENNA) 156 MG/ML SUSY injection Inject 156 mg into the muscle once.     pantoprazole (PROTONIX) 20 MG tablet Take 1 tablet (20 mg total) by mouth daily. (Patient not taking: Reported on 01/18/2022) 30 tablet 0   polyethylene glycol powder (GLYCOLAX/MIRALAX) 17 GM/SCOOP powder Take 17 g by mouth daily as needed for moderate constipation or severe constipation. (Patient not taking: Reported on  01/18/2022) 238 g 0   polyethylene glycol (MIRALAX / GLYCOLAX) 17 g packet Take 17 g by mouth daily. (Patient not taking: Reported on 01/18/2022) 14 each 0    Psychiatric Specialty Exam: Presentation  General Appearance: Disheveled  Eye Contact:Minimal  Speech:Garbled  Speech Volume:Normal  Handedness:Right   Mood and Affect  Mood:Labile  Affect:Labile   Thought Process  Thought Processes:Disorganized  Descriptions of Associations:Loose  Orientation:Full (Time, Place and Person)  Thought Content:Delusions; Paranoid Ideation; Illogical  History of Schizophrenia/Schizoaffective disorder:Yes  Duration of Psychotic Symptoms:Greater than six months  Hallucinations:Hallucinations: Auditory Description of Auditory Hallucinations: Would not specify  Ideas of Reference:Paranoia  Suicidal Thoughts:Suicidal Thoughts: No  Homicidal Thoughts:Homicidal Thoughts: No   Sensorium  Memory:Immediate Poor; Recent Poor  Judgment:Impaired  Insight:Lacking   Executive Functions  Concentration:Poor  Attention Span:Poor  Recall:Poor  Fund of Knowledge:Poor  Language:Fair   Psychomotor Activity  Psychomotor Activity:Psychomotor Activity: Restlessness   Assets  Assets:Physical Health; Resilience; Social Support    Sleep  Sleep:Sleep: Poor   Physical Exam: Physical Exam Neurological:     Mental Status: She is alert. She is disoriented.  Psychiatric:        Attention and Perception: She perceives auditory hallucinations.        Mood and Affect: Affect is labile.        Behavior: Behavior is agitated.        Thought Content: Thought content is paranoid and delusional.    Review of Systems  Psychiatric/Behavioral:  Positive for hallucinations.    Blood pressure (!) 149/92, pulse 98, temperature 98.1 F (36.7 C), temperature source Oral, resp. rate 19, SpO2 97 %, unknown if currently breastfeeding. There is no height or weight on file to calculate  BMI.  Medical  Decision Making: Patient case reviewed and discussed with Dr. Viviano Simas.  She continues to meet criteria for inpatient psychiatric treatment and IVC.  There is no current availability at [facility], CSW notified and patient has been faxed out.  EDP, RN, and [name] notified of disposition.  Problem 1: psychosis - Depakote 500 mg BID -Zyprexa 10mg  BID  Will not consider restarting Clozaril at this time due to history of noncompliance with blood work and medications. LG reported ACT team was in the process of discontinuing this medication. ED to continue monitoring blood work as it is unsure of last clozaril dose.   Disposition: Recommend psychiatric Inpatient admission when medically cleared.  , NP 01/18/2022 4:14 PM

## 2022-01-18 NOTE — ED Notes (Signed)
Pt continuously coming out of room, walking toward exit. Screaming in the room. Pt making threats towards staff. Psych NP at bedside. IM injections ordered.

## 2022-01-18 NOTE — ED Notes (Signed)
Pt walked up to nurse's station. Restraints still around ankles. Gait steady. Pt tearful but redirectable back to bed. Offered food and pt accepted breakfast sandwich and coffee. Asking to talk to social worker and for home medications. Informed that pharmacy needs to complete med rec first. Pt verbalized understanding.

## 2022-01-19 DIAGNOSIS — F201 Disorganized schizophrenia: Secondary | ICD-10-CM | POA: Diagnosis not present

## 2022-01-19 MED ORDER — STERILE WATER FOR INJECTION IJ SOLN
INTRAMUSCULAR | Status: AC
  Administered 2022-01-19: 1.2 mL via INTRAMUSCULAR
  Filled 2022-01-19: qty 10

## 2022-01-19 MED ORDER — HYDROXYZINE HCL 25 MG PO TABS
25.0000 mg | ORAL_TABLET | Freq: Once | ORAL | Status: AC
  Administered 2022-01-19: 25 mg via ORAL
  Filled 2022-01-19: qty 1

## 2022-01-19 MED ORDER — LORAZEPAM 2 MG/ML IJ SOLN
2.0000 mg | Freq: Once | INTRAMUSCULAR | Status: AC
  Administered 2022-01-19: 2 mg via INTRAMUSCULAR
  Filled 2022-01-19: qty 1

## 2022-01-19 MED ORDER — HALOPERIDOL LACTATE 5 MG/ML IJ SOLN
10.0000 mg | Freq: Once | INTRAMUSCULAR | Status: AC
  Administered 2022-01-19: 10 mg via INTRAMUSCULAR
  Filled 2022-01-19: qty 2

## 2022-01-19 NOTE — Progress Notes (Signed)
Inpatient Behavioral Health Placement  Meets inpatient criteria per Vesta Mixer, NP.  There are no available beds at [facility] per Lynnda Shields, RN, [facility]  Referral was sent to the following facilities;   Destination Service Provider Address Phone Fax  [facility]  [place], [place] [address] [place]  [facility]  [address] [place] [address] [phone]  [facility]  [place], [place] [address] [phone]  [facility]  [address] [place] [address] [place]  [facility]  [address] [place] [address] [phone]  [place] [address] [place] [address] [phone]  [facility]  [address], [place] [address] [phone] [place]  [address] [place] [address] [phone] [place]  [address] [place] [address] [phone] [facility]  [address] [place] [address] [phone] [place]  [address], [place] [address] [phone] [facility]  [address], [place] [address] [phone] [facility]  [address], [place] [address] [phone] [facility]  [address], [place] [address] [phone]  [facility]  [address] [place] [address] [phone]  [facility]  [address] [place] [address] [phone]    Situation ongoing,  CSW will follow  up.   Benjaman Kindler, MSW, LCSWA 01/19/2022  @ 3:17 AM

## 2022-01-19 NOTE — Progress Notes (Signed)
Inpatient Behavioral Health Placement   Meets inpatient criteria per Sheran Fava, FNP. There are no available beds at [facility] per Lynnda Shields, RN, [facility].  Referral was sent to the following facilities;      Destination Service Provider Address Phone Fax  [facility]  [place], [place] [address] [place]  [facility]  [address] [place] [address] [phone]  [facility]  [place], [place] [address] [phone]  [facility]  [place] [place] [address] [place]  [facility]  [address] [place] [address] [phone]  [place] [address], [place] [address] [phone]  [facility]  [address] [place] [address] [phone] [place]  [address] [place] [address] [phone] [place]  [address], [place] [address] [phone] [facility]  [address], [facility] [employer] [facility] [place] [address] [phone] [place]  [address], [place] [address] [phone] [facility]  [address] [place] [address] [phone] [facility]  [address] [place] [address] [phone] [facility]  [address] [place] [address] [phone]  [facility]  [address] [place] [address] [phone]  [facility]  [address] [place] [address] [phone]   Situation ongoing,  CSW will follow  up.   Benjaman Kindler, MSW, LCSWA 01/19/2022  @ 7:25 PM

## 2022-01-19 NOTE — ED Notes (Addendum)
Pt uncooperative. Noted pt grabbed hospital phone from nursing station to make a phone call w/o permission.

## 2022-01-19 NOTE — ED Notes (Signed)
Pt OOB to desk x3 asking for something to help her sleep. This Probation officer and RN advised pt that we had sent a message to the provider but were awaiting orders. On the 3rd request, pt became increasingly agitated, started cursing at staff and continually yells at staff that she is "ready to get the f*ck up out of here."  Security called to standby pt's room due to her increasing aggression towards staff.

## 2022-01-19 NOTE — ED Notes (Addendum)
Pt has opened multiple supplies in the room, tore drawer front off, rude to staff, slamming door in staffs face, wandering in hallway. She is fully aware of her behavior as she tells you what she is doing and has done to the room. She is a concern for staff safety due to walking up and slamming the door without concern for others safety.
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
## 2022-01-19 NOTE — ED Notes (Signed)
Pt refused VS. Pt took off BP cuff while attempting to obtain VS. Medic aware

## 2022-01-19 NOTE — NC FL2 (Deleted)
F

## 2022-01-19 NOTE — ED Provider Notes (Signed)
Patient has become more agitated, yelling at staff, tearing drawers out of the cabinets. She had been given vistaril to help with sleep a short time ago but she vomited that up by gagging herself. I attempted to de-escalate with verbal reassurance and by answering her questions regarding her dispo plan but she continued to be agitated, uncooperative and argumentative. Additional Haldol/Ativan ordered for patient and staff safety.    Truddie Hidden, MD 01/19/22 [phone]
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
## 2022-01-19 NOTE — ED Notes (Signed)
Pt asleep att. Respirations even and unlabored. Will continue to monitor

## 2022-01-19 NOTE — ED Notes (Signed)
Patient came out of room and was screaming stating she wanted some coffee and sandwiches, when this nurse told her she can't have anything else to eat then she started walking up to this nurse and yelling and security stepped in and helped patient back to room. Patient started throwing things around the room and broke one of the drawer doors off, medical provider made aware.

## 2022-01-19 NOTE — ED Notes (Signed)
Per sitter, pt has grabbed phone from nursing station multiple times w/o permission. Pt difficult to redirect. Has not been cooperative all day.

## 2022-01-19 NOTE — ED Notes (Signed)
Pt has been moved to [employer]. She is verbally acting out at present time. Ronalee Belts is speaking with her, regarding behavior.

## 2022-01-19 NOTE — ED Notes (Signed)
Pt soiled linens 2x, sitter provided clean sheets. Noted per sitter, pt has urinated while ambulating to the bathroom and on the bathroom floor. Pt A&O 4

## 2022-01-19 NOTE — ED Notes (Signed)
Pt will not stay in area of bed. She comes up to the desk and takes items. She is not redirectable. Due to increased agitation, Geodon given, pt continues to try to come away from bed. Pt placed in 4 pt restraints. Now yelling and screaming, removing wrist restraints. Restraints have been re secured. Continue to monitor pt.

## 2022-01-19 NOTE — Progress Notes (Addendum)
On 01/18/22 at [place] started process for [facility] referral and verbal was completed for referral. This CSW will assist and follow up with [place].  Benjaman Kindler, MSW, LCSWA 01/19/2022 3:20 AM

## 2022-01-19 NOTE — Progress Notes (Signed)
CSW spoke with [facility], [place] and confirmed verbal with [facility]ographics along confirming updated documentation that was faxed. CSW will follow up. At this time [facility] referral remains under review.    Benjaman Kindler, MSW, [facility] 01/19/2022 9:37 PM

## 2022-01-19 NOTE — ED Provider Notes (Signed)
Emergency Medicine Observation Re-evaluation Note  Molly [name] is a 36 y.o. female, seen on rounds today.  Pt initially presented to the ED for complaints of Insomnia Currently, the patient is alert, active in her room, no longer agitated.  Physical Exam  BP (!) 131/100 (BP Location: Right Arm)   Pulse 96   Temp 98.7 F (37.1 C) (Oral)   Resp 20   SpO2 98%  Physical Exam General: NAD Cardiac: Well-perfused Lungs: Even and unlabored Psych: Previous agitation has resolved, patient alert and active, no longer yelling at staff, status post Haldol and Ativan.  ED Course / MDM  EKG:   I have reviewed the labs performed to date as well as medications administered while in observation.  Recent changes in the last 24 hours include patient has had multiple episodes of agitation overnight, requiring as needed Geodon in addition to her regularly scheduled medications.  She was additionally administered Haldol and Ativan this morning due to persistent episodes of agitation not responding to verbal de-escalation.  This morning, she is more calm and has required no further as needed medications.  Plan  Current plan is for inpatient psychiatric admission.    Regan Lemming, MD 01/19/22 [phone]

## 2022-01-20 DIAGNOSIS — F201 Disorganized schizophrenia: Secondary | ICD-10-CM | POA: Diagnosis not present

## 2022-01-20 LAB — URINALYSIS, ROUTINE W REFLEX MICROSCOPIC
Bilirubin Urine: NEGATIVE
Glucose, UA: NEGATIVE mg/dL
Hgb urine dipstick: NEGATIVE
Ketones, ur: NEGATIVE mg/dL
Leukocytes,Ua: NEGATIVE
Nitrite: NEGATIVE
Protein, ur: NEGATIVE mg/dL
Specific Gravity, Urine: 1.005 (ref 1.005–1.030)
pH: 8 (ref 5.0–8.0)

## 2022-01-20 LAB — RAPID URINE DRUG SCREEN, HOSP PERFORMED
Amphetamines: NOT DETECTED
Barbiturates: NOT DETECTED
Benzodiazepines: NOT DETECTED
Cocaine: NOT DETECTED
Opiates: NOT DETECTED
Tetrahydrocannabinol: NOT DETECTED

## 2022-01-20 MED ORDER — STERILE WATER FOR INJECTION IJ SOLN
INTRAMUSCULAR | Status: AC
  Administered 2022-01-20: 1.2 mL
  Filled 2022-01-20: qty 10

## 2022-01-20 MED ORDER — OLANZAPINE 10 MG PO TBDP
10.0000 mg | ORAL_TABLET | Freq: Three times a day (TID) | ORAL | Status: DC | PRN
  Administered 2022-01-20 – 2022-01-22 (×5): 10 mg via ORAL
  Filled 2022-01-20 (×7): qty 1

## 2022-01-20 MED ORDER — LORAZEPAM 1 MG PO TABS
1.0000 mg | ORAL_TABLET | ORAL | Status: AC | PRN
  Administered 2022-01-20: 1 mg via ORAL
  Filled 2022-01-20: qty 1

## 2022-01-20 MED ORDER — ZIPRASIDONE MESYLATE 20 MG IM SOLR: 20.0000 mg | INTRAMUSCULAR | Status: DC | PRN

## 2022-01-20 MED ORDER — MELATONIN 3 MG PO TABS
3.0000 mg | ORAL_TABLET | Freq: Every day | ORAL | Status: DC
  Administered 2022-01-20 – 2022-01-28 (×10): 3 mg via ORAL
  Filled 2022-01-20 (×10): qty 1

## 2022-01-20 NOTE — ED Notes (Signed)
Pt screaming and yelling at staff stating we are trying to starve her to death. Pt instructed to return to room, that dinner trays would be delivered shortly. Pt took her call light and started banging it against the bed. Pt unable to be redirected at this time.

## 2022-01-20 NOTE — Progress Notes (Signed)
CSW contacted [facility] and spoke with the Intake RN regarding the status of the patient's referral. Per Stanton Kidney, the patient has been waitlist for admissions.    Mariea Clonts, MSW, [name]-A  8:11 PM 01/20/2022

## 2022-01-20 NOTE — ED Notes (Signed)
Patient was ripping the shirt to make hair pieces. I asked her to stop destroying hospital property. I offered her 2 pairs of mesh panties. She asked for 2 more pairs. I brought her 2 more pair and informed her that's all she could have for her hair.

## 2022-01-20 NOTE — ED Notes (Signed)
Breakfast tray to the bedside.

## 2022-01-20 NOTE — ED Provider Notes (Signed)
Emergency Medicine Observation Re-evaluation Note  Molly [name] is a 36 y.o. female, seen on rounds today.  Pt initially presented to the ED for complaints of Insomnia Currently, the patient is alert, active in her room, no longer agitated.  Physical Exam  BP (!) 158/101 (BP Location: Left Arm)   Pulse 80   Temp (!) 97.4 F (36.3 C) (Oral)   Resp 20   SpO2 100%  Physical Exam General: NAD Cardiac: Well-perfused Lungs: Even and unlabored Psych: Previous agitation has resolved, patient alert and active, no longer yelling at staff, status post Haldol and Ativan.  ED Course / MDM  EKG:   I have reviewed the labs performed to date as well as medications administered while in observation.  Recent changes in the last 24 hours include none  Plan  Current plan is for inpatient psychiatric admission.       Deno Etienne, DO 01/20/22 [phone]

## 2022-01-20 NOTE — ED Notes (Signed)
When patient first came to room from [place], she walked and took a shower independently with no problem. At 3 am, we asked her to get up and change her wet bed and scrubs and clean up. She stated she needed assistance to the bathroom with a walker. She acted like it hurt her to ambulate. She c/o her right arm hurting. We did place a walker in the room but uncertain if she really needs it.

## 2022-01-20 NOTE — ED Notes (Signed)
Pt in room screaming and yelling ran and slammed her door repeatedly. Pt deescalated by sitter and sat on bed. Pt tearful stating "he took my burritos and my tacos. Those were my enchiladas. I want them back".

## 2022-01-20 NOTE — ED Notes (Signed)
Pt screaming and yelling at internal stimuli in her room.

## 2022-01-20 NOTE — ED Notes (Signed)
Pt continues to have aggressive outbursts. PRN geodon to be administered.

## 2022-01-20 NOTE — ED Notes (Signed)
Pt awake, asking for her morning medications.

## 2022-01-20 NOTE — ED Notes (Signed)
This RN went in to give pt her geodon injection when pt became irate yelling "Bitch, Fuck you Bitch". Pt then attempted to throw the call light at this RN. [employer] and security to the bedside to assist with safe administration of IM medication.

## 2022-01-20 NOTE — ED Notes (Signed)
At beginning of shift, we informed her that she could have her tray, sandwiches, cheese, and cracker with one cup of coffee and one ginger ale. We informed her that this was all she could have until breakfast. When she woke up at 3am, she asked [place] multiple times for food. We kept stating that all she could have was water until breakfast. She stated she was going to let Medicaid know that we were starving her. I walked out of room and stated that this was the policy in [place].

## 2022-01-20 NOTE — ED Notes (Signed)
Pt in room watching TV. Pt has been cooperative so far this morning.

## 2022-01-20 NOTE — ED Notes (Signed)
Patient escalated after the patient in room 29 escalated. She asked for more medication but she didn't want a shot. Administered PRN zyprexa. Will continue to monitor.

## 2022-01-20 NOTE — ED Notes (Addendum)
Patient has urinated on self multiple times. Still trying to collect UDS. Notified Dr. Dayna Barker. New order for urinalysis. Will continue to monitor.

## 2022-01-21 DIAGNOSIS — F201 Disorganized schizophrenia: Secondary | ICD-10-CM | POA: Diagnosis not present

## 2022-01-21 LAB — WET PREP, GENITAL
Clue Cells Wet Prep HPF POC: NONE SEEN
Sperm: NONE SEEN
Trich, Wet Prep: NONE SEEN
WBC, Wet Prep HPF POC: <10 (ref <10)
Yeast Wet Prep HPF POC: NONE SEEN

## 2022-01-21 MED ORDER — LORAZEPAM 1 MG PO TABS
1.0000 mg | ORAL_TABLET | Freq: Three times a day (TID) | ORAL | Status: DC | PRN
  Administered 2022-01-21 – 2022-01-28 (×14): 1 mg via ORAL
  Filled 2022-01-21 (×14): qty 1

## 2022-01-21 MED ORDER — HALOPERIDOL 5 MG PO TABS
5.0000 mg | ORAL_TABLET | Freq: Four times a day (QID) | ORAL | Status: DC | PRN
  Administered 2022-01-21 – 2022-01-23 (×5): 5 mg via ORAL
  Filled 2022-01-21 (×5): qty 1

## 2022-01-21 MED ORDER — ACETAMINOPHEN 500 MG PO TABS
1000.0000 mg | ORAL_TABLET | Freq: Once | ORAL | Status: AC
  Administered 2022-01-21: 1000 mg via ORAL
  Filled 2022-01-21 (×2): qty 2

## 2022-01-21 NOTE — ED Notes (Signed)
Heard patient ripping up something else and found it was the sheet. She was making a dress with belt with the sheet. I took out the other items that I thought she could tear up. I also asked her to please clean up her bathroom. She said no multiple times. She finally said she would clean up if we left the room. She finally cleaned her bathroom.

## 2022-01-21 NOTE — ED Notes (Signed)
Patient requested oxygen. I checked her O2 sats which were 100%. Explained to her the danger of over oxygenation.

## 2022-01-21 NOTE — ED Provider Notes (Signed)
Emergency Medicine Observation Re-evaluation Note  Molly [name] is a 36 y.o. female, seen on rounds today.  Pt initially presented to the ED for complaints of Insomnia Currently, the patient is sleeping comfortably.  Physical Exam  BP (!) 158/137 (BP Location: Left Arm) Comment: Patient refused to stop moving while BP was taken.  Pulse 99   Temp 98.1 F (36.7 C) (Oral)   Resp 18   SpO2 98%  Physical Exam General: NAD Cardiac: regular rate Lungs: No respiratory distress Psych: Asleep  ED Course / MDM  EKG:   I have reviewed the labs performed to date as well as medications administered while in observation.  Recent changes in the last 24 hours include none.  Plan  Current plan is for inpatient psychiatric placement.    Cristie Hem, MD 01/21/22 0730

## 2022-01-21 NOTE — ED Notes (Signed)
Pt has been manic for this shift, singing loudly in the shower, showering multiple times while wearing clothes she made out torn up sheets.

## 2022-01-21 NOTE — ED Notes (Addendum)
Patient has very strong foul smell at peri area even after 5 showers taken today. Urine has been tested and is negative. Notified Dr. Roderic Palau with this concern. New order for wet prep and chlamydia. Sent to lab. Patient asked for paperwork to complete to get lab results to give to boyfriend.

## 2022-01-21 NOTE — ED Notes (Signed)
Pt asking to use phone and advised no calls until 10am.

## 2022-01-21 NOTE — ED Notes (Signed)
Patient requesting reading glasses. Will pass on to dayshift RN to see if we can get some for her.

## 2022-01-21 NOTE — ED Notes (Signed)
Patient requesting more medication for sleep. Too early to give PRN PO ativan. Notified Dr. Roderic Palau. Will continue to monitor.

## 2022-01-21 NOTE — Progress Notes (Signed)
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 female patient brought in by EMS with history of schizophrenia, noncompliant with medications for unknown amount of time, unable to engage in coherent conversation, not sleeping well, and unknown current substance use.  She is currently homeless and has a legal guardian through [name]. She is closely followed by an act team.  During the visit today, while in the midst of taking a shower, she insisted that this provider remain present, stating that she was almost finished. This Psychiatric Nurse Practitioner was accompanied by the student psych NP.  Following her shower, she came from the bathroom and sat on the bed wearing what appeared to be a bathing suit, wet and covered in soap suds. Per nursing staff the patient made the bathing suit using towels and sheets. Despite her unusual behavior and appearance, she claimed to feel fine. Molly [name] continues to deny experiencing suicidal ideations, homicidal ideations, or auditory and visual hallucinations. She indicated that she has been able to tolerate her oral medications and has been taking them as prescribed. She did mention experiencing irritability but stated that this is primarily triggered by her inability to smoke a cigarette. Notably, a nicotine patch was observed on her right upper arm. Inquiring about her discharge, she requests that her case worker, [name], be contacted.  Patient has continued to display aggressive behavior, requiring frequent use of medications for agitation.  Past Psychiatric History: History significant for  schizophrenia versus schizoaffective, homelessness, and noncompliance with medications.  [place] Scale:  Flowsheet Row ED from 01/18/2022 in [facility] to Hosp-Admission (Discharged) from 10/25/2021 in [facility] ED from 10/23/2021 in [facility] EMERGENCY DEPARTMENT  C-SSRS RISK CATEGORY No Risk No Risk No Risk       Past Medical History:  Past Medical History:  Diagnosis Date   Abnormal Pap smear    ASC-cannot exclude HGSIL on Pap 02/15/2012   ASC-US on 02/03/2012 pap (associated Trichomonas infection). No reflex HPV testing performed on specimen.  Patient informed that she will need repeat Pap in one year.       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 Dennie Bible (Psych) At [facility] in  [place], [place]. 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   Qualifier: Diagnosis of  By: McDiarmid MD, Benjaman Pott USER 02/08/2009   Qualifier: Diagnosis of  By: Knox Royalty      Past Surgical History:  Procedure Laterality Date  INCISION AND DRAINAGE     pilanodal cyst   TIBIA IM NAIL INSERTION Right 10/26/2021   Procedure: INTRAMEDULLARY (IM) NAIL TIBIAL;  Surgeon: Myrene Galas, MD;  Location: [facility] OR;  Service: Orthopedics;  Laterality: Right;   TOOTH EXTRACTION N/A 10/07/2017   Procedure: EXTRACTION TEETH NUMBERS ONE, SEVENTEEN, NINETEEN AND THIRTY TWO;   Surgeon: Ocie Doyne, DDS;  Location: [facility] OR;  Service: Oral Surgery;  Laterality: N/A;   Family History:  Family History  Adopted: Yes  Problem Relation Age of Onset   Bipolar disorder Sister    Alcohol abuse Brother    Cancer Father    Diabetes Mother     Social History:  Social History   Substance and Sexual Activity  Alcohol Use No   Comment: occ     Social History   Substance and Sexual Activity  Drug Use No    Social History   Socioeconomic History   Marital status: Single    Spouse name: Not on file   Number of children: Not on file   Years of education: Not on file   Highest education level: Not on file  Occupational History   Not on file  Tobacco Use   Smoking status: Every Day    Packs/day: 2.00    Years: 10.00    Total pack years: 20.00    Types: Cigarettes   Smokeless tobacco: Never  Vaping Use   Vaping Use: Never used  Substance and Sexual Activity   Alcohol use: No    Comment: occ   Drug use: No   Sexual activity: Yes    Birth control/protection: Injection  Other Topics Concern   Not on file  Social History Narrative   Adopted   Has Guardian   Living in [place] home with Bradly Chris   Transportation: Bus   Social Determinants of Health   Financial Resource Strain: Not on file  Food Insecurity: Not on file  Transportation Needs: Not on file  Physical Activity: Not on file  Stress: Not on file  Social Connections: Not on file    Sleep: Poor  Appetite:  Good  Current Medications: Current Facility-Administered Medications  Medication Dose Route Frequency Provider Last Rate Last Admin   acetaminophen (TYLENOL) tablet 1,000 mg  1,000 mg Oral Once Lonell Grandchild, MD       divalproex (DEPAKOTE) DR tablet 500 mg  500 mg Oral BID Eligha Bridegroom, NP   500 mg at 01/21/22 0921   haloperidol (HALDOL) tablet 5 mg  5 mg Oral Q6H PRN Dione Booze, MD   5 mg at 01/21/22 0115   LORazepam (ATIVAN) tablet 1 mg  1 mg Oral TID PRN Dione Booze, MD   1 mg at 01/21/22 1637   melatonin tablet 3 mg  3 mg Oral QHS Mesner, Bianco Cange, MD   3 mg at 01/20/22 2109   nicotine (NICODERM CQ - dosed in mg/24 hours) patch 21 mg  21 mg Transdermal Daily Molpus, John, MD   21 mg at 01/21/22 0923   OLANZapine zydis (ZYPREXA) disintegrating tablet 10 mg  10 mg Oral BID Eligha Bridegroom, NP   10 mg at 01/21/22 0921   OLANZapine zydis (ZYPREXA) disintegrating tablet 10 mg  10 mg Oral Q8H PRN Gloris Manchester, MD   10 mg at 01/21/22 1500   ziprasidone (GEODON) injection 20 mg  20 mg Intramuscular BID PRN Eligha Bridegroom, NP   20 mg at 01/20/22 1818   Current Outpatient Medications  Medication Sig  Dispense Refill   cloZAPine (CLOZARIL) 100 MG tablet Take 100 mg by mouth at bedtime. Taking with 50 mg tablet = 150 mg at bedtime     clozapine (CLOZARIL) 50 MG tablet Take 50 mg by mouth at bedtime. Taking with the 100 mg = 150 mg at bedtime     divalproex (DEPAKOTE ER) 500 MG 24 hr tablet Take 1,000 mg by mouth 2 (two) times daily.     OLANZapine (ZYPREXA) 20 MG tablet Take 20 mg by mouth at bedtime.     ascorbic acid (VITAMIN C) 500 MG tablet Take 1 tablet (500 mg total) by mouth daily. (Patient not taking: Reported on 01/18/2022) 30 tablet 0   clonazePAM (KLONOPIN) 0.5 MG tablet Take 1 tablet (0.5 mg total) by mouth 2 (two) times daily. 30 tablet 0   cloZAPine (CLOZARIL) 25 MG tablet Take 3 tablets (75 mg total) by mouth at bedtime. (Patient not taking: Reported on 01/18/2022) 30 tablet 0   clozapine (CLOZARIL) 50 MG tablet Take 1 tablet (50 mg total) by mouth daily. (Patient not taking: Reported on 01/18/2022) 30 tablet 0   diphenhydrAMINE (BENADRYL) 25 MG tablet Take 2 tablets (50 mg total) by mouth once as needed for up to 1 dose (Agitation refractory to Zyprexa). (Patient not taking: Reported on 01/18/2022) 60 tablet 0   divalproex (DEPAKOTE) 250 MG DR tablet Take 5 tablets (1,250 mg total) by mouth every 12 (twelve) hours. (Patient not taking: Reported on  01/18/2022) 30 tablet 0   docusate sodium (COLACE) 100 MG capsule Take 1 capsule (100 mg total) by mouth daily. (Patient not taking: Reported on 01/18/2022) 10 capsule 0   naloxegol oxalate (MOVANTIK) 12.5 MG TABS tablet Take 1 tablet  by mouth 2 (two) times daily with a meal. (Patient not taking: Reported on 01/18/2022) 30 tablet 0   nicotine polacrilex (NICORETTE) 2 MG gum Take 1 each (2 mg total) by mouth as needed for smoking cessation. (Patient not taking: Reported on 01/18/2022) 100 tablet 0   OLANZapine (ZYPREXA) 15 MG tablet Take 1 tablet (15 mg total) by mouth at bedtime. (Patient not taking: Reported on 01/18/2022) 30 tablet 0   OLANZapine (ZYPREXA) 5 MG tablet Take 1 tablet (5 mg total) by mouth daily. (Patient not taking: Reported on 01/18/2022) 30 tablet 0   OLANZapine (ZYPREXA) 10 MG tablet Take 1 tablet (10 mg total) by mouth 2 (two) times daily as needed (agitation and psychosis). (Patient not taking: Reported on 01/18/2022) 30 tablet 0   ondansetron (ZOFRAN-ODT) 4 MG disintegrating tablet Take 1 tablet (4 mg total) by mouth 2 (two) times daily as needed for nausea or vomiting. (Patient not taking: Reported on 01/18/2022) 20 tablet 0   paliperidone (INVEGA SUSTENNA) 156 MG/ML SUSY injection Inject 156 mg into the muscle once.     pantoprazole (PROTONIX) 20 MG tablet Take 1 tablet (20 mg total) by mouth daily. (Patient not taking: Reported on 01/18/2022) 30 tablet 0   polyethylene glycol powder (GLYCOLAX/MIRALAX) 17 GM/SCOOP powder Take 17 g by mouth daily as needed for moderate constipation or severe constipation. (Patient not taking: Reported on 01/18/2022) 238 g 0   polyethylene glycol (MIRALAX / GLYCOLAX) 17 g packet Take 17 g by mouth daily. (Patient not taking: Reported on 01/18/2022) 14 each 0    Lab Results:  Results for orders placed or performed during the hospital encounter of 01/18/22 (from the past 48 hour(s))  Rapid urine drug screen (hospital performed)     Status: None  Collection Time: 01/20/22  3:28 AM  Result Value Ref Range   Opiates NONE DETECTED NONE DETECTED   Cocaine NONE DETECTED NONE DETECTED   Benzodiazepines NONE DETECTED NONE DETECTED   Amphetamines NONE DETECTED NONE DETECTED   Tetrahydrocannabinol NONE DETECTED NONE DETECTED   Barbiturates NONE DETECTED NONE DETECTED    Comment: (NOTE) DRUG SCREEN FOR MEDICAL PURPOSES ONLY.  IF CONFIRMATION IS NEEDED FOR ANY PURPOSE, NOTIFY LAB WITHIN 5 DAYS.  LOWEST DETECTABLE LIMITS FOR URINE DRUG SCREEN Drug Class                     Cutoff (ng/mL) Amphetamine and metabolites    1000 Barbiturate and metabolites    200 Benzodiazepine                 993 Tricyclics and metabolites     300 Opiates and metabolites        300 Cocaine and metabolites        300 THC                            50 Performed at [facility], [place] [address], [place], [place] [address]   Urinalysis, Routine w reflex microscopic     Status: Abnormal   Collection Time: 01/20/22  3:28 AM  Result Value Ref Range   Color, Urine COLORLESS (A) YELLOW   APPearance CLEAR CLEAR   Specific Gravity, Urine 1.005 1.005 - 1.030   pH 8.0 5.0 - 8.0   Glucose, UA NEGATIVE NEGATIVE mg/dL   Hgb urine dipstick NEGATIVE NEGATIVE   Bilirubin Urine NEGATIVE NEGATIVE   Ketones, ur NEGATIVE NEGATIVE mg/dL   Protein, ur NEGATIVE NEGATIVE mg/dL   Nitrite NEGATIVE NEGATIVE   Leukocytes,Ua NEGATIVE NEGATIVE    Comment: Performed at [place] [address] [place], [place] [address]    Blood Alcohol level:  Lab Results  Component Value Date   ETH <10 01/18/2022   ETH <10 10/25/2021    Physical Findings:  CIWA:    COWS:     Musculoskeletal: Strength & Muscle Tone: within normal limits Gait & Station: normal Patient leans: N/A  Psychiatric Specialty Exam:  Presentation  General Appearance: Disheveled  Eye Contact:Good  Speech:Clear and Coherent; Normal Rate  Speech  Volume:Normal  Handedness:Right   Mood and Affect  Mood:Euphoric  Affect:Flat   Thought Process  Thought Processes:Coherent; Irrevelant  Descriptions of Associations:Loose  Orientation:Full (Time, Place and Person)  Thought Content:Illogical  History of Schizophrenia/Schizoaffective disorder:Yes  Duration of Psychotic Symptoms:Greater than six months  Hallucinations:Hallucinations: None  Ideas of Reference:None  Suicidal Thoughts:Suicidal Thoughts: No  Homicidal Thoughts:Homicidal Thoughts: No   Sensorium  Memory:Immediate Poor; Recent Fair; Remote Fair  Judgment:Impaired  Insight:Poor   Executive Functions  Concentration:Fair  Attention Span:Poor  Cecil-Bishop of Knowledge:Poor  Language:Fair   Psychomotor Activity  Psychomotor Activity:Psychomotor Activity: Restlessness   Assets  Assets:Financial Resources/Insurance; Physical Health; Social Support   Sleep  Sleep:Sleep: Poor    Physical Exam: Physical Exam Constitutional:      General: She is not in acute distress.    Appearance: She is not ill-appearing, toxic-appearing or diaphoretic.  HENT:     Right Ear: External ear normal.     Left Ear: External ear normal.  Eyes:     General:        Right eye: No discharge.        Left eye:  No discharge.  Cardiovascular:     Rate and Rhythm: Normal rate.  Pulmonary:     Effort: Pulmonary effort is normal. No respiratory distress.  Musculoskeletal:        General: Normal range of motion.     Cervical back: Normal range of motion.  Neurological:     Mental Status: She is alert and oriented to person, place, and time.  Psychiatric:        Mood and Affect: Mood is anxious and depressed.        Behavior: Behavior is cooperative.        Thought Content: Thought content is not paranoid. Thought content does not include homicidal or suicidal ideation.    Review of Systems  Respiratory:  Negative for cough and shortness of breath.    Cardiovascular:  Negative for chest pain.  Gastrointestinal:  Negative for diarrhea, nausea and vomiting.  Psychiatric/Behavioral:  Negative for depression, hallucinations, memory loss and suicidal ideas. The patient has insomnia. The patient is not nervous/anxious.   All other systems reviewed and are negative.  Blood pressure 122/79, pulse 95, temperature (!) 97.5 F (36.4 C), temperature source Oral, resp. rate 20, SpO2 99 %, unknown if currently breastfeeding. There is no height or weight on file to calculate BMI.   Medical Decision Making: Molly [name] is a 36 y.o. female patient brought in by EMS with history of schizophrenia, noncompliant with medications for unknown amount of time, unable to engage in coherent conversation, not sleeping well, and unknown current substance use.  She is currently homeless and has a legal guardian through [name]. She is closely followed by an act team.  - Depakote 500 mg BID -Zyprexa 10mg  BID -Continue agitation protocol.    Will not consider restarting Clozaril at this time due to history of noncompliance with blood work and medications. LG reported ACT team was in the process of discontinuing this medication. ED to continue monitoring blood work as it is unsure of last clozaril dose.    Disposition: Recommend psychiatric Inpatient admission when medically cleared.  Problem 1: Schizoaffective Disorder   Jackelyn PolingJason A Lekita Kerekes, NP 01/21/2022, 8:19 PM

## 2022-01-21 NOTE — ED Notes (Signed)
Pt singing loudly in the shower, asked to sing at a lower volume.

## 2022-01-21 NOTE — ED Notes (Addendum)
Patient still escalating after giving PRN zyprexa. She slammed her door 3 times. I asked her not to damage hospital property. She called me "the devil". Too early to give IM geodon. She stated to the NT that she couldn't sleep and wanted something for sleep. Notified Dr. Roxanne Mins. New PRN PO haldol and ativan ordered. Will administer and continue to monitor.

## 2022-01-21 NOTE — ED Notes (Signed)
NT brought new linens and scrubs for patient because she had urinated on her sheets. Patient refused to move. Will continue to monitor.

## 2022-01-21 NOTE — ED Notes (Signed)
Heard something ripping in the patient's room. Went to see the patient and she was tearing up a dirty linen bag up. I asked her to give it to me and to please stop tearing up hospital property. She looked at me and told me she would buy it. I asked her again to give it to me. The NT came to the door. Patient finally gave it to me. I had to throw the linen bag away b/c she had shredded the linen bag. Patient had been given the linen bag to put her dirty towels in it.

## 2022-01-21 NOTE — ED Notes (Signed)
Patient threw coffee in hall, wall and on the floor. She also threw her breakfast tray on the floor.Patient yelled and shut her door hard over and over. Patient then cleaned up room after redirecting.

## 2022-01-22 DIAGNOSIS — F201 Disorganized schizophrenia: Secondary | ICD-10-CM | POA: Diagnosis not present

## 2022-01-22 LAB — GC/CHLAMYDIA PROBE AMP (~~LOC~~) NOT AT ARMC
Chlamydia: NEGATIVE
Comment: NEGATIVE
Comment: NORMAL
Neisseria Gonorrhea: NEGATIVE

## 2022-01-22 MED ORDER — HYDROCERIN EX CREA
TOPICAL_CREAM | Freq: Every day | CUTANEOUS | Status: DC | PRN
  Filled 2022-01-22: qty 113

## 2022-01-22 NOTE — BH Assessment (Addendum)
01/22/2022 @ 8:06AM confirmed with "Gae Bon" @ [place] that patient is still on the wait list. Clinician will follow up with the [facility] after the am bed meeting to check [facility] bed availability for this patient.    Patient also re-faxed out to alternative hospitals for consideration of bed placement.    Destination Service Provider Address Phone Fax  [facility]  [place], [place] [address] [place]  [facility]  [address] [place] [address] [phone]  [facility]  [place], [place] [address] [phone]  [facility]  [address] [place] [address] [place]  [facility]  [address], [place] [address] [phone]  [place] [address], [place] [address] [phone]  [facility]  [address] [place] [address] [phone] [place]  [address], [place] [address] [phone] [place]  [address], [place] [address] [phone] [facility]  [address] [place] [address] [phone] [place]  [address], [place] [address] [phone] [facility]  [address] [place] [address] [phone] [facility]  [address] [place] [address] [phone] [facility]  [address] [place] [address] [phone]  [facility]  [address] [place] [address] [place]  [facility]  [place] [place]  [address] [phone]  [place]  [address] [place] [address] [phone]  [facility]  [address] [place] [address] [phone]  [facility]  [address], [place] [address] [facility]  [address] [place], [place] [address] [phone]

## 2022-01-22 NOTE — ED Notes (Signed)
NT provided pt a snack per pt request. Pt cursed NT. Pt repeatedly insisted on coffee. Pt stated she can curse at staff and we were trying to starve her. Verbally deescalated pt and provided coffee.

## 2022-01-22 NOTE — ED Notes (Signed)
Patient reports increased agitation and anxiety.  Continues to request medication.  Patient provided with PRN and made aware that night time medications could be given at 2130

## 2022-01-22 NOTE — ED Notes (Signed)
Pt a

## 2022-01-22 NOTE — ED Notes (Signed)
Patient screaming from room that she "needs my medicine."  Patient medicated per order.  Patient continues to demand medication.  Encouraged to give medication time to work and we could reassess shortly.

## 2022-01-22 NOTE — Progress Notes (Signed)
As of 01/21/22 Pt remains on the [facility] waitlist. Pt continues to meet inpatient criteria per provider Lindon Romp, NP. Pt has been faxed to out of network providers. There are no available beds at [facility] per [facility] Lavell Luster, RN.  Destination Service Provider Address Phone Fax  [facility]  [place], [place] [address] [place]  [facility]  [address] [place] [address] [phone]  [facility]  [place], [place] [address] [phone]  [facility]  [place] [place] [address] [place]  [facility]  [address], [place] [address] [phone]  [place] [address] [place] [address] [phone]  [facility]  [address] [place] [address] [place] [address] [phone] [place]  [address], [place] [address] [phone] [place]  [address] [place] [address] [phone] [facility]  [address] [place] [address] [phone] [place]  [address], [place] [address] [phone] [facility]  [address], [place] [address] [phone] [facility]  [address], [place] [address] [phone] [facility]  [address] [place] [address] [phone]  [facility]  [address] [place] [address] [place]  [facility]  [place]., [place] [address] [phone]  [place]  [address] [place] [address] [phone]  [facility]  [address] [place] [address] [phone]  [facility]  [address] [place] [address] [phone] [facility]  [place], [place] [address] [phone] [place], MSW, LCSWA 01/22/2022 2:07 AM

## 2022-01-22 NOTE — ED Notes (Signed)
Pt uncooperative and agitated from 7:00 AM to present. Pt frequently yelled out, cursed staff, and reluctantly cooperated with staff.

## 2022-01-22 NOTE — ED Notes (Signed)
Patient provided with warm blankets for comfort.  Old blankets removed

## 2022-01-22 NOTE — Progress Notes (Signed)
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 female patient brought in by EMS with history of schizophrenia, noncompliant with medications for unknown amount of time, unable to engage in coherent conversation, not sleeping well, and unknown current substance use.  She is currently homeless and has a legal guardian through [name]. She is closely followed by an act team.  HPI:  During the visit today, the patient took the initiative to call this provider to her bedside to convey specific concerns. She reported experiencing malnutrition, expressing that no one has been feeding her, and emphasizing her perceived state of malnourishment. Furthermore, the patient inquired about the status of her admission to [facility], expressing satisfaction with the care she has received there in the past. She stated, "they'll take me, they always take me." The patient reported compliance with her prescribed medications and denied experiencing irritability or outbursts lately. She also explicitly denied having auditory and visual hallucinations, as well as any thoughts of suicide or homicide.   Observed patient walking out of her room today with an outfit that she made from the hospital sheets. She is screaming at staff demanding to the know results of her wet prep and chlamydia testing that was completed yesterday. (Results negative)  Patient has continued to display aggressive behavior, requiring frequent use of medications for agitation.  Past Psychiatric History: History significant for schizophrenia versus schizoaffective, homelessness, and  noncompliance with medications.  [place] Scale:  Flowsheet Row ED from 01/18/2022 in [facility] ED to Hosp-Admission (Discharged) from 10/25/2021 in [facility] ED from 10/23/2021 in [facility] EMERGENCY DEPARTMENT  C-SSRS RISK CATEGORY No Risk No Risk No Risk       Past Medical History:  Past Medical History:  Diagnosis Date   Abnormal Pap smear    ASC-cannot exclude HGSIL on Pap 02/15/2012   ASC-US on 02/03/2012 pap (associated Trichomonas infection). No reflex HPV testing performed on specimen.  Patient informed that she will need repeat Pap in one year.       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 Dennie Bible (Psych) At [facility] in  [place], [place]. 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   Qualifier: Diagnosis of  By: McDiarmid MD, Benjaman Pott USER 02/08/2009   Qualifier: Diagnosis of  By: Knox Royalty      Past Surgical History:  Procedure Laterality Date   INCISION AND DRAINAGE     pilanodal cyst  TIBIA IM NAIL INSERTION Right 10/26/2021   Procedure: INTRAMEDULLARY (IM) NAIL TIBIAL;  Surgeon: Myrene Galas, MD;  Location: [facility] OR;  Service: Orthopedics;  Laterality: Right;   TOOTH EXTRACTION N/A 10/07/2017   Procedure: EXTRACTION TEETH NUMBERS ONE, SEVENTEEN, NINETEEN AND THIRTY TWO;  Surgeon: Ocie Doyne, DDS;  Location: [facility] OR;  Service: Oral  Surgery;  Laterality: N/A;   Family History:  Family History  Adopted: Yes  Problem Relation Age of Onset   Bipolar disorder Sister    Alcohol abuse Brother    Cancer Father    Diabetes Mother     Social History:  Social History   Substance and Sexual Activity  Alcohol Use No   Comment: occ     Social History   Substance and Sexual Activity  Drug Use No    Social History   Socioeconomic History   Marital status: Single    Spouse name: Not on file   Number of children: Not on file   Years of education: Not on file   Highest education level: Not on file  Occupational History   Not on file  Tobacco Use   Smoking status: Every Day    Packs/day: 2.00    Years: 10.00    Total pack years: 20.00    Types: Cigarettes   Smokeless tobacco: Never  Vaping Use   Vaping Use: Never used  Substance and Sexual Activity   Alcohol use: No    Comment: occ   Drug use: No   Sexual activity: Yes    Birth control/protection: Injection  Other Topics Concern   Not on file  Social History Narrative   Adopted   Has Guardian   Living in [place] home with Bradly Chris   Transportation: Bus   Social Determinants of Health   Financial Resource Strain: Not on file  Food Insecurity: Not on file  Transportation Needs: Not on file  Physical Activity: Not on file  Stress: Not on file  Social Connections: Not on file    Sleep: Poor  Appetite:  Good  Current Medications: Current Facility-Administered Medications  Medication Dose Route Frequency Provider Last Rate Last Admin   divalproex (DEPAKOTE) DR tablet 500 mg  500 mg Oral BID Eligha Bridegroom, NP   500 mg at 01/22/22 0828   haloperidol (HALDOL) tablet 5 mg  5 mg Oral Q6H PRN Dione Booze, MD   5 mg at 01/22/22 1144   LORazepam (ATIVAN) tablet 1 mg  1 mg Oral TID PRN Dione Booze, MD   1 mg at 01/21/22 2143   melatonin tablet 3 mg  3 mg Oral QHS Mesner, Aryah Doering, MD   3 mg at 01/21/22 2102   nicotine (NICODERM CQ - dosed in  mg/24 hours) patch 21 mg  21 mg Transdermal Daily Molpus, John, MD   21 mg at 01/22/22 0827   OLANZapine zydis (ZYPREXA) disintegrating tablet 10 mg  10 mg Oral BID Eligha Bridegroom, NP   10 mg at 01/22/22 0827   OLANZapine zydis (ZYPREXA) disintegrating tablet 10 mg  10 mg Oral Q8H PRN Gloris Manchester, MD   10 mg at 01/22/22 0919   ziprasidone (GEODON) injection 20 mg  20 mg Intramuscular BID PRN Eligha Bridegroom, NP   20 mg at 01/20/22 1818   Current Outpatient Medications  Medication Sig Dispense Refill   cloZAPine (CLOZARIL) 100 MG tablet Take 100 mg by mouth at bedtime. Taking with 50 mg tablet = 150 mg at bedtime     clozapine (  CLOZARIL) 50 MG tablet Take 50 mg by mouth at bedtime. Taking with the 100 mg = 150 mg at bedtime     divalproex (DEPAKOTE ER) 500 MG 24 hr tablet Take 1,000 mg by mouth 2 (two) times daily.     OLANZapine (ZYPREXA) 20 MG tablet Take 20 mg by mouth at bedtime.     ascorbic acid (VITAMIN C) 500 MG tablet Take 1 tablet (500 mg total) by mouth daily. (Patient not taking: Reported on 01/18/2022) 30 tablet 0   clonazePAM (KLONOPIN) 0.5 MG tablet Take 1 tablet (0.5 mg total) by mouth 2 (two) times daily. 30 tablet 0   cloZAPine (CLOZARIL) 25 MG tablet Take 3 tablets (75 mg total) by mouth at bedtime. (Patient not taking: Reported on 01/18/2022) 30 tablet 0   clozapine (CLOZARIL) 50 MG tablet Take 1 tablet (50 mg total) by mouth daily. (Patient not taking: Reported on 01/18/2022) 30 tablet 0   diphenhydrAMINE (BENADRYL) 25 MG tablet Take 2 tablets (50 mg total) by mouth once as needed for up to 1 dose (Agitation refractory to Zyprexa). (Patient not taking: Reported on 01/18/2022) 60 tablet 0   divalproex (DEPAKOTE) 250 MG DR tablet Take 5 tablets (1,250 mg total) by mouth every 12 (twelve) hours. (Patient not taking: Reported on 01/18/2022) 30 tablet 0   docusate sodium (COLACE) 100 MG capsule Take 1 capsule (100 mg total) by mouth daily. (Patient not taking: Reported on 01/18/2022) 10  capsule 0   naloxegol oxalate (MOVANTIK) 12.5 MG TABS tablet Take 1 tablet  by mouth 2 (two) times daily with a meal. (Patient not taking: Reported on 01/18/2022) 30 tablet 0   nicotine polacrilex (NICORETTE) 2 MG gum Take 1 each (2 mg total) by mouth as needed for smoking cessation. (Patient not taking: Reported on 01/18/2022) 100 tablet 0   OLANZapine (ZYPREXA) 15 MG tablet Take 1 tablet (15 mg total) by mouth at bedtime. (Patient not taking: Reported on 01/18/2022) 30 tablet 0   OLANZapine (ZYPREXA) 5 MG tablet Take 1 tablet (5 mg total) by mouth daily. (Patient not taking: Reported on 01/18/2022) 30 tablet 0   OLANZapine (ZYPREXA) 10 MG tablet Take 1 tablet (10 mg total) by mouth 2 (two) times daily as needed (agitation and psychosis). (Patient not taking: Reported on 01/18/2022) 30 tablet 0   ondansetron (ZOFRAN-ODT) 4 MG disintegrating tablet Take 1 tablet (4 mg total) by mouth 2 (two) times daily as needed for nausea or vomiting. (Patient not taking: Reported on 01/18/2022) 20 tablet 0   paliperidone (INVEGA SUSTENNA) 156 MG/ML SUSY injection Inject 156 mg into the muscle once.     pantoprazole (PROTONIX) 20 MG tablet Take 1 tablet (20 mg total) by mouth daily. (Patient not taking: Reported on 01/18/2022) 30 tablet 0   polyethylene glycol powder (GLYCOLAX/MIRALAX) 17 GM/SCOOP powder Take 17 g by mouth daily as needed for moderate constipation or severe constipation. (Patient not taking: Reported on 01/18/2022) 238 g 0   polyethylene glycol (MIRALAX / GLYCOLAX) 17 g packet Take 17 g by mouth daily. (Patient not taking: Reported on 01/18/2022) 14 each 0    Lab Results:  Results for orders placed or performed during the hospital encounter of 01/18/22 (from the past 48 hour(s))  GC/Chlamydia probe amp ([place]) not at [facility]     Status: None   Collection Time: 01/21/22  7:59 PM  Result Value Ref Range   Neisseria Gonorrhea Negative    Chlamydia Negative    Comment Normal Reference Ranger  Chlamydia -  Negative    Comment      Normal Reference Range Neisseria Gonorrhea - Negative  Wet prep, genital     Status: None   Collection Time: 01/21/22  8:16 PM   Specimen: Vaginal  Result Value Ref Range   Yeast Wet Prep HPF POC NONE SEEN NONE SEEN   Trich, Wet Prep NONE SEEN NONE SEEN   Clue Cells Wet Prep HPF POC NONE SEEN NONE SEEN   WBC, Wet Prep HPF POC <10 <10   Sperm NONE SEEN     Comment: Performed at [facility], [place] [address] [place], [place] [address]    Blood Alcohol level:  Lab Results  Component Value Date   ETH <10 01/18/2022   ETH <10 10/25/2021    Physical Findings:  CIWA:    COWS:     Musculoskeletal: Strength & Muscle Tone: within normal limits Gait & Station: normal Patient leans: N/A  Psychiatric Specialty Exam:  Presentation  General Appearance: Disheveled  Eye Contact:Good  Speech:Clear and Coherent; Normal Rate  Speech Volume:Normal  Handedness:Right   Mood and Affect  Mood:Euphoric  Affect:Flat   Thought Process  Thought Processes:Coherent; Irrevelant  Descriptions of Associations:Loose  Orientation:Full (Time, Place and Person)  Thought Content:Illogical  History of Schizophrenia/Schizoaffective disorder:Yes  Duration of Psychotic Symptoms:Greater than six months  Hallucinations:Hallucinations: None  Ideas of Reference:None  Suicidal Thoughts:Suicidal Thoughts: No  Homicidal Thoughts:Homicidal Thoughts: No   Sensorium  Memory:Immediate Poor; Recent Fair; Remote Fair  Judgment:Impaired  Insight:Poor   Executive Functions  Concentration:Fair  Attention Span:Poor  [place] of Knowledge:Poor  Language:Fair   Psychomotor Activity  Psychomotor Activity:Psychomotor Activity: Restlessness   Assets  Assets:Financial Resources/Insurance; Physical Health; Social Support   Sleep  Sleep:Sleep: Poor    Physical Exam: Physical Exam Constitutional:      General: She is not in  acute distress.    Appearance: She is not ill-appearing, toxic-appearing or diaphoretic.  HENT:     Right Ear: External ear normal.     Left Ear: External ear normal.  Eyes:     General:        Right eye: No discharge.        Left eye: No discharge.  Cardiovascular:     Rate and Rhythm: Normal rate.  Pulmonary:     Effort: Pulmonary effort is normal. No respiratory distress.  Musculoskeletal:        General: Normal range of motion.     Cervical back: Normal range of motion.  Neurological:     Mental Status: She is alert and oriented to person, place, and time.  Psychiatric:        Mood and Affect: Mood is anxious and depressed.        Behavior: Behavior is cooperative.        Thought Content: Thought content is not paranoid. Thought content does not include homicidal or suicidal ideation.    Review of Systems  Respiratory:  Negative for cough and shortness of breath.   Cardiovascular:  Negative for chest pain.  Gastrointestinal:  Negative for diarrhea, nausea and vomiting.  Psychiatric/Behavioral:  Negative for depression, hallucinations, memory loss and suicidal ideas. The patient has insomnia. The patient is not nervous/anxious.   All other systems reviewed and are negative.  Blood pressure (!) 144/89, pulse 92, temperature 98.1 F (36.7 C), temperature source Oral, resp. rate 17, SpO2 99 %, unknown if currently breastfeeding. There is no height or weight on file to  calculate BMI.   Medical Decision Making: Levester FreshJessica S Vecchiarelli is a 36 y.o. female patient brought in by EMS with history of schizophrenia, noncompliant with medications for unknown amount of time, unable to engage in coherent conversation, not sleeping well, and unknown current substance use.  She is currently homeless and has a legal guardian through [name]. She is closely followed by an act team.  - Depakote 500 mg BID -Zyprexa 10mg  BID -Continue agitation protocol.    Will not consider restarting Clozaril at this  time due to history of noncompliance with blood work and medications. LG reported ACT team was in the process of discontinuing this medication. ED to continue monitoring blood work as it is unsure of last clozaril dose.    Disposition: Recommend psychiatric Inpatient admission when medically cleared.  Problem 1: Schizoaffective Disorder   Jackelyn PolingJason A Stanford Strauch, NP 01/22/2022, 6:25 PM

## 2022-01-22 NOTE — ED Notes (Signed)
Patient has been sleeping. Will get VSs when she wakes up.

## 2022-01-22 NOTE — ED Provider Notes (Signed)
Emergency Medicine Observation Re-evaluation Note  Molly [name] is a 36 y.o. female, seen on rounds today.  Pt initially presented to the ED for complaints of Insomnia Currently, the patient is sleeping comfortably.  Physical Exam  BP (!) 144/89 (BP Location: Right Arm)   Pulse 92   Temp 98.1 F (36.7 C) (Oral)   Resp 17   SpO2 99%  Physical Exam General: NAD Cardiac: regular rate Lungs: No respiratory distress Psych: Asleep  ED Course / MDM  EKG:EKG Interpretation  Date/Time:  Wednesday January 20 2022 20:28:02 EDT Ventricular Rate:  97 PR Interval:  150 QRS Duration: 72 QT Interval:  358 QTC Calculation: 454 R Axis:   53 Text Interpretation: Normal sinus rhythm Normal ECG When compared with ECG of 02-Dec-2021 15:34, PREVIOUS ECG IS PRESENT Confirmed by Davonna Belling [id number]) on 01/21/2022 5:41:23 PM  I have reviewed the labs performed to date as well as medications administered while in observation.  Recent changes in the last 24 hours include none.  Plan  Current plan is for inpatient psychiatric placement.      Deno Etienne, DO 01/22/22 1656

## 2022-01-23 DIAGNOSIS — F201 Disorganized schizophrenia: Secondary | ICD-10-CM | POA: Diagnosis not present

## 2022-01-23 LAB — VALPROIC ACID LEVEL: Valproic Acid Lvl: 26 ug/mL — ABNORMAL LOW (ref 50.0–100.0)

## 2022-01-23 MED ORDER — HALOPERIDOL 5 MG PO TABS
5.0000 mg | ORAL_TABLET | Freq: Three times a day (TID) | ORAL | Status: DC | PRN
  Administered 2022-01-23: 5 mg via ORAL
  Filled 2022-01-23: qty 1

## 2022-01-23 MED ORDER — HALOPERIDOL LACTATE 5 MG/ML IJ SOLN: 5.0000 mg | Freq: Three times a day (TID) | INTRAMUSCULAR | Status: DC | PRN

## 2022-01-23 MED ORDER — DICLOFENAC SODIUM 1 % EX GEL
4.0000 g | Freq: Three times a day (TID) | CUTANEOUS | Status: DC | PRN
  Administered 2022-01-23 – 2022-01-28 (×8): 4 g via TOPICAL
  Filled 2022-01-23: qty 100

## 2022-01-23 MED ORDER — HALOPERIDOL 5 MG PO TABS
5.0000 mg | ORAL_TABLET | Freq: Four times a day (QID) | ORAL | Status: DC | PRN
  Administered 2022-01-23 – 2022-01-26 (×6): 5 mg via ORAL
  Filled 2022-01-23 (×6): qty 1

## 2022-01-23 MED ORDER — HALOPERIDOL LACTATE 5 MG/ML IJ SOLN
5.0000 mg | Freq: Four times a day (QID) | INTRAMUSCULAR | Status: DC | PRN
  Administered 2022-01-27: 5 mg via INTRAMUSCULAR
  Filled 2022-01-23: qty 1

## 2022-01-23 MED ORDER — OLANZAPINE 10 MG PO TBDP
10.0000 mg | ORAL_TABLET | Freq: Two times a day (BID) | ORAL | Status: DC | PRN
  Administered 2022-01-25 – 2022-01-27 (×3): 10 mg via ORAL
  Filled 2022-01-23 (×2): qty 1

## 2022-01-23 MED ORDER — BENZTROPINE MESYLATE 0.5 MG PO TABS
0.5000 mg | ORAL_TABLET | Freq: Two times a day (BID) | ORAL | Status: DC
  Administered 2022-01-23 – 2022-01-29 (×12): 0.5 mg via ORAL
  Filled 2022-01-23 (×12): qty 1

## 2022-01-23 NOTE — ED Notes (Signed)
Nurse Tech attempted to obtain vital signs at this time.  Patient refused.  Informed patient of importance of vitals and patient continues to refuse.  Will attempt again shortly once patient is awake.

## 2022-01-23 NOTE — ED Notes (Signed)
Pt came to the door of her room and said, "Is Molly [name] gay?" Pt came out of her room and attempted to walk behind the nurses' station. [employer] and I repeatedly redirected pt to her room. Pt yelled, "Do you have AIDS? I smell AIDS." Reluctantly, pt returned to the room and sat on the bed.

## 2022-01-23 NOTE — ED Notes (Signed)
Pt yelled out loudly, "I want my nicotine patch." Advised pt she received a patch at 9:11 AM today, and the next patch can be administered tomorrow at 9:11 AM.

## 2022-01-23 NOTE — ED Notes (Signed)
Pt turned on the shower and 30 minutes later was not in the shower. When I asked her to turn off the shower, she refused and said she will take a shower when we bring her soap. Five bottles of soap were in the shower. Provided pt 4 bottles of soap per her request. Pt refused vitals.

## 2022-01-23 NOTE — ED Provider Notes (Signed)
Emergency Medicine Observation Re-evaluation Note  Molly [name] is a 36 y.o. female with schizoaffective disorder, seen on rounds today.  Pt initially presented to the ED for complaints of Insomnia, anxiety, auditory hallucinations and aggressive behavior.   Physical Exam  BP (!) 144/89 (BP Location: Right Arm)   Pulse 92   Temp 98.1 F (36.7 C) (Oral)   Resp 17   SpO2 99%  Physical Exam General: Resting comfortably on stretcher.  Alert and conversant Lungs: No respiratory distress Psych: Calm  ED Course / MDM  EKG:EKG Interpretation  Date/Time:  Wednesday January 20 2022 20:28:02 EDT Ventricular Rate:  97 PR Interval:  150 QRS Duration: 72 QT Interval:  358 QTC Calculation: 454 R Axis:   53 Text Interpretation: Normal sinus rhythm Normal ECG When compared with ECG of 02-Dec-2021 15:34, PREVIOUS ECG IS PRESENT Confirmed by Davonna Belling [phone]) on 01/21/2022 5:41:23 PM  I have reviewed the labs performed to date as well as medications administered while in observation.  Recent changes in the last 24 hours include:  - Seen yesterday by TTS who feels patient should remain admitted 2/2 aggressive and bizarre behavior. They are seeking placement at [facility].  -Per RN slept well with as needed medications  Plan  Current plan is for psych placement.   Fransico Meadow, MD 01/23/22 [phone]

## 2022-01-23 NOTE — ED Notes (Signed)
Patient requesting "something to drink."  Patient made aware that she may only have water at this time due to it being in the middle of the night.  When provided with water, patient threw cup at staff and another into trash can.  Patient made aware that she would not be able to have any additional cups at this time.

## 2022-01-23 NOTE — ED Notes (Signed)
Patient observed resting on bed in room with eyes closed.  Respirations even and unlabored.  Extra cups and trash removed from patient's room.

## 2022-01-23 NOTE — ED Notes (Signed)
Pt refusing vital signs
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
## 2022-01-23 NOTE — ED Notes (Signed)
Patient remains asleep at this time.  Respirations even and unlabored.  Will continue to monitor
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
## 2022-01-24 DIAGNOSIS — F201 Disorganized schizophrenia: Secondary | ICD-10-CM | POA: Diagnosis not present

## 2022-01-24 MED ORDER — DIVALPROEX SODIUM 500 MG PO DR TAB
1000.0000 mg | DELAYED_RELEASE_TABLET | Freq: Two times a day (BID) | ORAL | Status: DC
  Administered 2022-01-24 – 2022-01-27 (×7): 1000 mg via ORAL
  Filled 2022-01-24 (×6): qty 2

## 2022-01-24 MED ORDER — LISINOPRIL 10 MG PO TABS
5.0000 mg | ORAL_TABLET | Freq: Every day | ORAL | Status: DC
  Administered 2022-01-24 – 2022-01-29 (×6): 5 mg via ORAL
  Filled 2022-01-24 (×7): qty 1

## 2022-01-24 MED ORDER — POLYETHYLENE GLYCOL 3350 17 G PO PACK
17.0000 g | PACK | Freq: Every day | ORAL | Status: DC
  Administered 2022-01-24 – 2022-01-29 (×6): 17 g via ORAL
  Filled 2022-01-24 (×6): qty 1

## 2022-01-24 NOTE — ED Notes (Signed)
Pt slept until 11:20 AM today. Upon awaking, pt denied SI, HI, and AVH. Yet, pt appeared to respond to internal stimuli throughout the day. Pt yelled while showering and multiple times when her door was shut. However, pt remained mostly calm and cooperative from the time she awoke to the present. Today, pt agitation and aggression were significantly decreased compared to the previous two days. Pt wore a scrub top and scrub bottom for half the day. Pt was medication compliant. Pt refused vitals starting early morning on 9/23 until 2:15 PM today. Currently, pt is tearing the hospital sheets into strips and making a head wrap, skirt, belt, bracelets, and hair ties.

## 2022-01-24 NOTE — ED Notes (Signed)
Pt has refused vitals on several occasions throughout shift.
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
## 2022-01-24 NOTE — ED Notes (Signed)
Pt woke up and requested the cream for her knees. Topical was administered as ordered.

## 2022-01-24 NOTE — ED Provider Notes (Signed)
Emergency Medicine Observation Re-evaluation Note  Molly [name] is a 36 y.o. female, seen on rounds today.  Pt initially presented to the ED for complaints of Insomnia Currently, the patient is resting comfortably.  Physical Exam  BP (!) 144/89 (BP Location: Right Arm)   Pulse 92   Temp 98.1 F (36.7 C) (Oral)   Resp 17   SpO2 99%  Physical Exam General: resting comfortably Cardiac: regular rate Lungs: breathing comfortably Psych: not agitated and calm  ED Course / MDM  EKG:EKG Interpretation  Date/Time:  Wednesday January 20 2022 20:28:02 EDT Ventricular Rate:  97 PR Interval:  150 QRS Duration: 72 QT Interval:  358 QTC Calculation: 454 R Axis:   53 Text Interpretation: Normal sinus rhythm Normal ECG When compared with ECG of 02-Dec-2021 15:34, PREVIOUS ECG IS PRESENT Confirmed by Davonna Belling [phone]) on 01/21/2022 5:41:23 PM  I have reviewed the labs performed to date as well as medications administered while in observation.  Recent changes in the last 24 hours include Seen yesterday by TTS who feels patient should remain admitted 2/2 aggressive and bizarre behavior. They are seeking placement at [place] is for placement.    Elgie Congo, MD 01/24/22 [phone]

## 2022-01-24 NOTE — ED Notes (Signed)
Patient refused to have her vitals taken tech sitter ask pt x 3 she refused again RN notify
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
## 2022-01-24 NOTE — ED Notes (Signed)
Pt observed to have made several items of "clothing" out of hospital items such as sheets, scrubs and toilet paper (a dress, head accessories, anklet and various other items). Explained to pt that we cannot keep supplying her with items that she is ripping and destroying. This RN does not feel that pt understands that this behavior is not acceptable. Pt believes these are her items of clothing. Pt also mentioned that she heard her sons voice and that she wanted to check on his condition as he must be "out there in the ER". Reassured pt her son was not here and that it was possibly somebody else she heard that sounded like him.

## 2022-01-24 NOTE — Progress Notes (Signed)
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 female patient brought in by EMS with history of schizophrenia, noncompliant with medications for unknown amount of time, unable to engage in coherent conversation, not sleeping well, and unknown current substance use.  She is currently homeless and has a legal guardian through [place]. She is closely followed by an act team.  HPI:  Patient requested to speak with me as I passed by her room today.  She states that last night she was hallucinating that there was a shadow in her room and also hallucinating that "some white girl was trying to smother me."  She states that she saw her sister Mammie Russian in her room last night.  She states that she and her sister both name Morene Crocker, but her sister's name is Mammie Russian.  She denies current auditory and visual hallucinations.  She does not appear to be responding to internal stimuli during this assessment.  Patient goes on to tell this provider that she has written the last speech for Edmon Crape.  She states that this letter has been printed in the news and observer.  She then states that she and Edmon Crape used to write each other when she was a little girl.  She states that at the time they were writing each other he was in the cadets.  She then talks about being friends with various wrappers and musicians.  She states that she would like to use her social security check to bale out a friend, who she considers a brother, out of jail.  She asked if I can take care of that for her.  Informed her that I am not able to assist her with  that request.  She denies suicidal ideations.  She denies homicidal ideations.   Past Psychiatric History: History significant for schizophrenia versus schizoaffective, homelessness, and noncompliance with medications.  [place] Scale:  [place] ED from 01/18/2022 in [place] DEPT ED to Hosp-Admission (Discharged) from 10/25/2021 in [place] ED from 10/23/2021 in [place] No Risk No Risk No Risk       Past Medical History:  Past Medical History:  Diagnosis Date   Abnormal Pap smear    ASC-cannot exclude HGSIL on Pap 02/15/2012   ASC-US on 02/03/2012 pap (associated Trichomonas infection). No reflex HPV testing performed on specimen.  Patient informed that she will need repeat Pap in one year.       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 Henrene Dodge (Psych) At [facility] in  [place], [place]. 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   Qualifier: Diagnosis of  By: McDiarmid MD, Jolyn Nap USER 02/08/2009   Qualifier: Diagnosis of  By: Samara Snide      Past Surgical History:  Procedure Laterality Date   INCISION AND DRAINAGE     pilanodal cyst   TIBIA IM NAIL INSERTION Right 10/26/2021   Procedure: INTRAMEDULLARY (IM) NAIL TIBIAL;  Surgeon: Altamese Martins[place], MD;  Location: [place];  Service: Orthopedics;   Laterality: Right;   TOOTH EXTRACTION N/A 10/07/2017   Procedure: EXTRACTION TEETH NUMBERS ONE, SEVENTEEN, NINETEEN AND THIRTY TWO;  Surgeon: Diona Browner, DDS;  Location: [place];  Service: Oral Surgery;  Laterality: N/A;   Family History:  Family History  Adopted: Yes  Problem Relation Age of Onset   Bipolar disorder Sister    Alcohol abuse Brother    Cancer Father    Diabetes Mother     Social History:  Social History   Substance and Sexual Activity  Alcohol Use No   Comment: occ     Social History   Substance and Sexual Activity  Drug Use No    Social History   Socioeconomic History   Marital status: Single    Spouse name: Not on file   Number of children: Not on file   Years of education: Not on file   Highest education level: Not on file  Occupational History   Not on file  Tobacco Use   Smoking status: Every Day    Packs/day: 2.00    Years: 10.00    Total pack years: 20.00    Types: Cigarettes   Smokeless tobacco: Never  Vaping Use   Vaping Use: Never used  Substance and Sexual Activity   Alcohol use: No    Comment: occ   Drug use: No   Sexual activity: Yes    Birth control/protection: Injection  Other Topics Concern   Not on file  Social History Narrative   Adopted   Has Guardian   Living in [place] home with [name]   Transportation: Bus   Social Determinants of Health   Financial Resource Strain: Not on file  Food Insecurity: Not on file  Transportation Needs: Not on file  Physical Activity: Not on file  Stress: Not on file  Social Connections: Not on file    Sleep: Poor  Appetite:  Good  Current Medications: Current Facility-Administered Medications  Medication Dose Route Frequency Provider Last Rate Last Admin   benztropine (COGENTIN) tablet 0.5 mg  0.5 mg Oral BID Lindon Romp A, NP   0.5 mg at 01/24/22 1125   diclofenac Sodium (VOLTAREN) 1 % topical gel 4 g  4 g Topical TID PRN Fransico Meadow, MD   4 g at 01/24/22 0501    divalproex (DEPAKOTE) DR tablet 1,000 mg  1,000 mg Oral BID Lindon Romp A, NP   1,000 mg at 01/24/22 1125   haloperidol (HALDOL) tablet 5 mg  5 mg Oral Q6H PRN Lindon Romp A, NP   5 mg at 01/24/22 1125   Or   haloperidol lactate (HALDOL) injection 5 mg  5 mg Intramuscular Q6H PRN Rozetta Nunnery, NP       hydrocerin (EUCERIN) cream   Topical Daily PRN Deno Etienne, DO       LORazepam (ATIVAN) tablet 1 mg  1 mg Oral TID PRN Delora Fuel, MD   1 mg at 01/23/22 2147   melatonin tablet 3 mg  3 mg Oral QHS Mesner, Tod Abrahamsen, MD   3 mg at 01/23/22 2147   nicotine (NICODERM CQ - dosed in mg/24 hours) patch 21 mg  21 mg Transdermal Daily Molpus, John, MD   21 mg at 01/23/22 0911   OLANZapine zydis (ZYPREXA) disintegrating tablet 10 mg  10 mg Oral BID Vesta Mixer, NP   10 mg at 01/24/22 1125   OLANZapine zydis (ZYPREXA) disintegrating tablet 10 mg  10 mg Oral Q12H PRN Rozetta Nunnery, NP       Current Outpatient Medications  Medication Sig Dispense Refill   cloZAPine (CLOZARIL) 100 MG tablet Take 100 mg by mouth at bedtime. Taking with 50 mg tablet = 150 mg at bedtime     clozapine (CLOZARIL) 50 MG tablet Take 50 mg by mouth at bedtime. Taking with the 100 mg = 150 mg at bedtime     divalproex (DEPAKOTE ER) 500 MG 24 hr tablet Take 1,000 mg by mouth 2 (two) times daily.     OLANZapine (ZYPREXA) 20 MG tablet Take 20 mg by mouth at bedtime.     ascorbic acid (VITAMIN C) 500 MG tablet Take 1 tablet (500 mg total) by mouth daily. (Patient not taking: Reported on 01/18/2022) 30 tablet 0   clonazePAM (KLONOPIN) 0.5 MG tablet Take 1 tablet (0.5 mg total) by mouth 2 (two) times daily. 30 tablet 0   cloZAPine (CLOZARIL) 25 MG tablet Take 3 tablets (75 mg total) by mouth at bedtime. (Patient not taking: Reported on 01/18/2022) 30 tablet 0   clozapine (CLOZARIL) 50 MG tablet Take 1 tablet (50 mg total) by mouth daily. (Patient not taking: Reported on 01/18/2022) 30 tablet 0   diphenhydrAMINE (BENADRYL) 25 MG tablet  Take 2 tablets (50 mg total) by mouth once as needed for up to 1 dose (Agitation refractory to Zyprexa). (Patient not taking: Reported on 01/18/2022) 60 tablet 0   divalproex (DEPAKOTE) 250 MG DR tablet Take 5 tablets (1,250 mg total) by mouth every 12 (twelve) hours. (Patient not taking: Reported on 01/18/2022) 30 tablet 0   docusate sodium (COLACE) 100 MG capsule Take 1 capsule (100 mg total) by mouth daily. (Patient not taking: Reported on 01/18/2022) 10 capsule 0   naloxegol oxalate (MOVANTIK) 12.5 MG TABS tablet Take 1 tablet  by mouth 2 (two) times daily with a meal. (Patient not taking: Reported on 01/18/2022) 30 tablet 0   nicotine polacrilex (NICORETTE) 2 MG gum Take 1 each (2 mg total) by mouth as needed for smoking cessation. (Patient not taking: Reported on 01/18/2022) 100 tablet 0   OLANZapine (ZYPREXA) 15 MG tablet Take 1 tablet (15 mg total) by mouth at bedtime. (Patient not taking: Reported on 01/18/2022) 30 tablet 0   OLANZapine (ZYPREXA) 5 MG tablet Take 1 tablet (5 mg total) by mouth daily. (Patient not taking: Reported on 01/18/2022) 30 tablet 0   OLANZapine (ZYPREXA) 10 MG tablet Take 1 tablet (10 mg total) by mouth 2 (two) times daily as needed (agitation and psychosis). (Patient not taking: Reported on 01/18/2022) 30 tablet 0   ondansetron (ZOFRAN-ODT) 4 MG disintegrating tablet Take 1 tablet (4 mg total) by mouth 2 (two) times daily as needed for nausea or vomiting. (Patient not taking: Reported on 01/18/2022) 20 tablet 0   paliperidone (INVEGA SUSTENNA) 156 MG/ML SUSY injection Inject 156 mg into the muscle once.  pantoprazole (PROTONIX) 20 MG tablet Take 1 tablet (20 mg total) by mouth daily. (Patient not taking: Reported on 01/18/2022) 30 tablet 0   polyethylene glycol powder (GLYCOLAX/MIRALAX) 17 GM/SCOOP powder Take 17 g by mouth daily as needed for moderate constipation or severe constipation. (Patient not taking: Reported on 01/18/2022) 238 g 0   polyethylene glycol (MIRALAX /  GLYCOLAX) 17 g packet Take 17 g by mouth daily. (Patient not taking: Reported on 01/18/2022) 14 each 0    Lab Results:  Results for orders placed or performed during the hospital encounter of 01/18/22 (from the past 48 hour(s))  Valproic acid level     Status: Abnormal   Collection Time: 01/23/22 11:40 AM  Result Value Ref Range   Valproic Acid Lvl 26 (L) 50.0 - 100.0 ug/mL    Comment: Performed at [facility], [place] [address] [place], [place] [address]    Blood Alcohol level:  Lab Results  Component Value Date   [facility] <10 01/18/2022   ETH <10 10/25/2021    Physical Findings:  CIWA:    COWS:     Musculoskeletal: Strength & Muscle Tone: within normal limits Gait & Station: normal Patient leans: N/A  Psychiatric Specialty Exam:  Presentation  General Appearance: Disheveled  Eye Contact:Good  Speech:Clear and Coherent; Normal Rate  Speech Volume:Increased  Handedness:Right   Mood and Affect  Mood:Irritable; Labile  Affect:Congruent   Thought Process  Thought Processes:Coherent; Irrevelant; Disorganized  Descriptions of Associations:Loose  Orientation:Full (Time, Place and Person)  Thought Content:Illogical; Delusions  History of Schizophrenia/Schizoaffective disorder:Yes  Duration of Psychotic Symptoms:Greater than six months  Hallucinations:Hallucinations: None Description of Auditory Hallucinations: Patient denies  Ideas of Reference:Delusions; Paranoia  Suicidal Thoughts:Suicidal Thoughts: No  Homicidal Thoughts:Homicidal Thoughts: No   Sensorium  Memory:Immediate Fair; Recent Fair; Remote Fair  Judgment:Impaired  Insight:Poor   Executive Functions  Concentration:Poor  Attention Span:Poor  [place]   Psychomotor Activity  Psychomotor Activity:Psychomotor Activity: Normal   Assets  Assets:Financial Resources/Insurance; Social Support   Sleep  Sleep:Sleep:  Poor    Physical Exam: Physical Exam Constitutional:      General: She is not in acute distress.    Appearance: She is not ill-appearing, toxic-appearing or diaphoretic.  HENT:     Right Ear: External ear normal.     Left Ear: External ear normal.  Eyes:     General:        Right eye: No discharge.        Left eye: No discharge.  Cardiovascular:     Rate and Rhythm: Normal rate.  Pulmonary:     Effort: Pulmonary effort is normal. No respiratory distress.  Musculoskeletal:        General: Normal range of motion.     Cervical back: Normal range of motion.  Neurological:     Mental Status: She is alert and oriented to person, place, and time.  Psychiatric:        Mood and Affect: Mood is anxious and depressed.        Behavior: Behavior is cooperative.        Thought Content: Thought content is not paranoid. Thought content does not include homicidal or suicidal ideation.    Review of Systems  Respiratory:  Negative for cough and shortness of breath.   Cardiovascular:  Negative for chest pain.  Gastrointestinal:  Negative for diarrhea, nausea and vomiting.  Psychiatric/Behavioral:  Negative for depression, hallucinations, memory loss and suicidal ideas. The  patient has insomnia. The patient is not nervous/anxious.   All other systems reviewed and are negative.  Blood pressure (!) 144/89, pulse 92, temperature 98.1 F (36.7 C), temperature source Oral, resp. rate 17, SpO2 99 %, unknown if currently breastfeeding. There is no height or weight on file to calculate BMI.   Medical Decision Making: Molly [name] is a 36 y.o. female patient brought in by EMS with history of schizophrenia, noncompliant with medications for unknown amount of time, unable to engage in coherent conversation, not sleeping well, and unknown current substance use.  She is currently homeless and has a legal guardian through [place]. She is closely followed by an act team.  VPA level 26. Patient has  historically been on higher doses of depakote and has tolerated well.   Increase Depakote to 1000 mg BID for mood stability Continue Zyprexa 10mg  BID for schizoaffective disorder Continue benztropine 0.5 mg BID for EPS prophylaxis  Agitation medications: Haldol 5 mg oral or IM every 6 hours prn   Olanzapine zydis every 12 hours prn  Recommend not using additional antipsychotics for agitation  Disposition: Recommend psychiatric Inpatient admission when medically cleared. CSW to continue seeking placement at [facility].   Problem 1: Schizoaffective Disorder   Rozetta Nunnery, NP 01/24/2022, 1:50 PM

## 2022-01-24 NOTE — Progress Notes (Signed)
Per Lindon Romp, NP, patient meets criteria for inpatient treatment. There are no available beds at [facility] today. CSW faxed referrals to the following facilities for review:  [facility], [place] [address] [place] [address] [phone] --  [place], [place] [address] [phone] --  [facility]  Pending - Request Sent N/A [address] [place] [address] [phone] --  [facility]  Pending - Request Sent N/A [address] [place] [address] [phone] --  [facility]  Pending - Request Sent N/A [place] [address] [place] [address] [phone] --  [facility]  Pending - Request Sent N/A [address] [place] [address] [phone] --  [place] N/A [address], [place] [address] [phone] --  [place]  Pending - Request Sent N/A [address] [place] [address] [phone] --  [facility]  Pending - Request Sent N/A [place], [place] [address] [phone] --  [facility]  Pending - Request Sent N/A [place] [place] [address] [phone] --  [facility]  Pending - Request Sent N/A [address], [place] [address] [phone] --  [place] N/A [address] [place] [address] [phone] --  [facility]  Pending -  Request Sent N/A [address] [name] [address] [phone] --  [facility]  Pending - Request Sent N/A [place], [place] [address] [phone] --  [facility]  Pending - Request Sent N/A [place] [place] [address] [phone] --  [facility]  Pending - Request Sent N/A [address] [place] [address] [phone] --  [place] [address] [place] [address] [phone] --  [facility]  Pending - Request Sent N/A [address] [place] [address] [phone] --  [place] [address] [place] [address] [phone] --  [place]  Pending - No Request Sent N/A [address] [place] [address] [phone] --  [facility]  Pending - No Request Sent N/A [address] [place], [place] [address] [phone] --  [facility]  Pending - No Request Sent N/A [address] [place] [address] [phone] [facility]  Pending - No Request Sent N/A [address] [place] [address] [phone] --   TTS will continue to seek bed placement.  Glennie [place], MSW, [facility] [place] Phone: [phone] Disposition/TOC

## 2022-01-24 NOTE — ED Notes (Addendum)
Pt to the door yelling at staff. Pt believes somebody "stole her test results". Advised pt that no one had been in her room or touched anything belonging to her. Pt redirected at this time however continues to yell profanity every few minutes. Pt also noted to be calling staff derogatory names such as "you dumb white black asian bitch" directed at sitter.

## 2022-01-25 DIAGNOSIS — F201 Disorganized schizophrenia: Secondary | ICD-10-CM | POA: Diagnosis not present

## 2022-01-25 MED ORDER — ACETAMINOPHEN 325 MG PO TABS
650.0000 mg | ORAL_TABLET | Freq: Once | ORAL | Status: AC
  Administered 2022-01-25: 650 mg via ORAL
  Filled 2022-01-25: qty 2

## 2022-01-25 NOTE — ED Notes (Signed)
Pt has been out of her room several times asking for various items. Pt has requested juice, soda, peanut butter and cracker, another patients meal tray, and staff members food, drinks and belongings. Advised pt that snacks were done for the night and that the next meal would be breakfast. Pt was offered water and declined. 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

## 2022-01-25 NOTE — ED Provider Notes (Signed)
Emergency Medicine Observation Re-evaluation Note  Molly [name] is a 36 y.o. female, seen on rounds today.  Pt initially presented to the ED for complaints of Insomnia Currently, the patient is sitting on bed.  Physical Exam  BP (!) 149/117 (BP Location: Right Arm)   Pulse (!) 104   Temp 98.1 F (36.7 C) (Oral)   Resp (!) 22   SpO2 100%  Physical Exam General: Awake, alert Cardiac: Extremities well-perfused Lungs: Breathing is unlabored Psych: No agitation, does not appear to be responding to internal stimuli  ED Course / MDM  EKG:EKG Interpretation  Date/Time:  Wednesday January 20 2022 20:28:02 EDT Ventricular Rate:  97 PR Interval:  150 QRS Duration: 72 QT Interval:  358 QTC Calculation: 454 R Axis:   53 Text Interpretation: Normal sinus rhythm Normal ECG When compared with ECG of 02-Dec-2021 15:34, PREVIOUS ECG IS PRESENT Confirmed by Davonna Belling [phone]) on 01/21/2022 5:41:23 PM  I have reviewed the labs performed to date as well as medications administered while in observation.  Recent changes in the last 24 hours include continued aggressive behavior last night.  Plan  Current plan is for inpatient psychiatric admission.    Godfrey Pick, MD 01/25/22 [phone]

## 2022-01-25 NOTE — Progress Notes (Signed)
[facility] MD Progress Note  01/25/2022 12:48 PM Molly [name]  MRN:  [id number]  Subjective:  [name] reported " I am doing alright."   Renewed First examination for involuntary commitment.  She was seen and evaluated face-to-face by this provider.  Calm, cooperative and slightly disorganized.  Reports she is hopeful to follow-up with [facility] inpatient admission and would like to follow-up with social worker regarding housing.  Denying suicidal or homicidal ideations.  Denies auditory visual hallucinations.  As charted reported patient continues to respond to internal stimuli, presents with mood lability,  agitation and aggressiveness.  Recent adjustment with Depakote.  Currently she is prescribed 1000 mg twice daily with the valproic acid level due on 01/27/2022.  Patient to continue Zyprexa 10 mg p.o. twice daily.  CSW to follow-up with [facility] waitlist status.  staff to continue to monitor for safety.  Support ,encouragement and reassurance was provided.  Per admission assessment note: "Molly [name] is a 36 y.o. female patient brought in by EMS with history of schizophrenia, noncompliant with medications for unknown amount of time, unable to engage in coherent conversation, not sleeping well, and unknown current substance use.  She is currently homeless and has a legal guardian through [name]. She is closely followed by an act team."  Principal Problem: Disorganized schizophrenia (HCC) Diagnosis: Principal Problem:   Disorganized schizophrenia (HCC) Active Problems:   Schizoaffective disorder-chronic with exacerbation (HCC)  Total Time spent with patient: 15 minutes  Past Psychiatric History:   Past Medical History:  Past Medical History:  Diagnosis Date   Abnormal Pap smear    ASC-cannot exclude HGSIL on Pap 02/15/2012   ASC-US on 02/03/2012 pap (associated Trichomonas infection). No reflex HPV testing performed on specimen.  Patient informed that she will need repeat Pap in one year.        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 Dennie Bible (Psych) At [facility] in  [place], [place]. 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   Qualifier: Diagnosis of  By: McDiarmid MD, Benjaman Pott USER 02/08/2009   Qualifier: Diagnosis of  By: Knox Royalty      Past Surgical History:  Procedure Laterality Date   INCISION AND DRAINAGE     pilanodal cyst   TIBIA IM NAIL INSERTION Right 10/26/2021   Procedure: INTRAMEDULLARY (IM) NAIL TIBIAL;  Surgeon: Myrene Galas, MD;  Location: [facility] OR;  Service: Orthopedics;  Laterality: Right;   TOOTH EXTRACTION N/A 10/07/2017   Procedure: EXTRACTION TEETH NUMBERS ONE, SEVENTEEN, NINETEEN AND THIRTY TWO;  Surgeon: Ocie Doyne, DDS;  Location: [facility] OR;  Service: Oral Surgery;  Laterality: N/A;   Family History:  Family History  Adopted: Yes  Problem Relation Age of Onset   Bipolar disorder Sister    Alcohol abuse Brother    Cancer Father    Diabetes Mother  Family Psychiatric  History:  Social History:  Social History   Substance and Sexual Activity  Alcohol Use No   Comment: occ     Social History   Substance and Sexual Activity  Drug Use No    Social History   Socioeconomic History   Marital status: Single    Spouse name: Not on file   Number of children: Not on file   Years of education: Not on file   Highest education level: Not on file  Occupational History   Not on file  Tobacco Use    Smoking status: Every Day    Packs/day: 2.00    Years: 10.00    Total pack years: 20.00    Types: Cigarettes   Smokeless tobacco: Never  Vaping Use   Vaping Use: Never used  Substance and Sexual Activity   Alcohol use: No    Comment: occ   Drug use: No   Sexual activity: Yes    Birth control/protection: Injection  Other Topics Concern   Not on file  Social History Narrative   Adopted   Has Guardian   Living in [place] home with [name]   Transportation: Bus   Social Determinants of Health   Financial Resource Strain: Not on file  Food Insecurity: Not on file  Transportation Needs: Not on file  Physical Activity: Not on file  Stress: Not on file  Social Connections: Not on file   Additional Social History:                         Sleep: Negative  Appetite:  Good  Current Medications: Current Facility-Administered Medications  Medication Dose Route Frequency Provider Last Rate Last Admin   benztropine (COGENTIN) tablet 0.5 mg  0.5 mg Oral BID Lindon Romp A, NP   0.5 mg at 01/25/22 1003   diclofenac Sodium (VOLTAREN) 1 % topical gel 4 g  4 g Topical TID PRN Fransico Meadow, MD   4 g at 01/24/22 0501   divalproex (DEPAKOTE) DR tablet 1,000 mg  1,000 mg Oral BID Lindon Romp A, NP   1,000 mg at 01/25/22 1002   haloperidol (HALDOL) tablet 5 mg  5 mg Oral Q6H PRN Lindon Romp A, NP   5 mg at 01/25/22 0346   Or   haloperidol lactate (HALDOL) injection 5 mg  5 mg Intramuscular Q6H PRN Rozetta Nunnery, NP       hydrocerin (EUCERIN) cream   Topical Daily PRN Deno Etienne, DO       lisinopril (ZESTRIL) tablet 5 mg  5 mg Oral Daily Hayden Rasmussen, MD   5 mg at 01/25/22 1003   LORazepam (ATIVAN) tablet 1 mg  1 mg Oral TID PRN Delora Fuel, MD   1 mg at 01/25/22 0346   melatonin tablet 3 mg  3 mg Oral QHS Mesner, Jason, MD   3 mg at 01/24/22 2120   nicotine (NICODERM CQ - dosed in mg/24 hours) patch 21 mg  21 mg Transdermal Daily Molpus, John, MD   21 mg at  01/24/22 1125   OLANZapine zydis (ZYPREXA) disintegrating tablet 10 mg  10 mg Oral BID Vesta Mixer, NP   10 mg at 01/25/22 1003   OLANZapine zydis (ZYPREXA) disintegrating tablet 10 mg  10 mg Oral Q12H PRN Lindon Romp A, NP   10 mg at 01/25/22 1002   polyethylene glycol (MIRALAX / GLYCOLAX) packet 17 g  17 g Oral Daily  Rozetta Nunnery, NP   17 g at 01/25/22 1003   Current Outpatient Medications  Medication Sig Dispense Refill   cloZAPine (CLOZARIL) 100 MG tablet Take 100 mg by mouth at bedtime. Taking with 50 mg tablet = 150 mg at bedtime     clozapine (CLOZARIL) 50 MG tablet Take 50 mg by mouth at bedtime. Taking with the 100 mg = 150 mg at bedtime     divalproex (DEPAKOTE ER) 500 MG 24 hr tablet Take 1,000 mg by mouth 2 (two) times daily.     OLANZapine (ZYPREXA) 20 MG tablet Take 20 mg by mouth at bedtime.     ascorbic acid (VITAMIN C) 500 MG tablet Take 1 tablet (500 mg total) by mouth daily. (Patient not taking: Reported on 01/18/2022) 30 tablet 0   clonazePAM (KLONOPIN) 0.5 MG tablet Take 1 tablet (0.5 mg total) by mouth 2 (two) times daily. 30 tablet 0   cloZAPine (CLOZARIL) 25 MG tablet Take 3 tablets (75 mg total) by mouth at bedtime. (Patient not taking: Reported on 01/18/2022) 30 tablet 0   clozapine (CLOZARIL) 50 MG tablet Take 1 tablet (50 mg total) by mouth daily. (Patient not taking: Reported on 01/18/2022) 30 tablet 0   diphenhydrAMINE (BENADRYL) 25 MG tablet Take 2 tablets (50 mg total) by mouth once as needed for up to 1 dose (Agitation refractory to Zyprexa). (Patient not taking: Reported on 01/18/2022) 60 tablet 0   divalproex (DEPAKOTE) 250 MG DR tablet Take 5 tablets (1,250 mg total) by mouth every 12 (twelve) hours. (Patient not taking: Reported on 01/18/2022) 30 tablet 0   docusate sodium (COLACE) 100 MG capsule Take 1 capsule (100 mg total) by mouth daily. (Patient not taking: Reported on 01/18/2022) 10 capsule 0   naloxegol oxalate (MOVANTIK) 12.5 MG TABS tablet Take 1  tablet  by mouth 2 (two) times daily with a meal. (Patient not taking: Reported on 01/18/2022) 30 tablet 0   nicotine polacrilex (NICORETTE) 2 MG gum Take 1 each (2 mg total) by mouth as needed for smoking cessation. (Patient not taking: Reported on 01/18/2022) 100 tablet 0   OLANZapine (ZYPREXA) 15 MG tablet Take 1 tablet (15 mg total) by mouth at bedtime. (Patient not taking: Reported on 01/18/2022) 30 tablet 0   OLANZapine (ZYPREXA) 5 MG tablet Take 1 tablet (5 mg total) by mouth daily. (Patient not taking: Reported on 01/18/2022) 30 tablet 0   OLANZapine (ZYPREXA) 10 MG tablet Take 1 tablet (10 mg total) by mouth 2 (two) times daily as needed (agitation and psychosis). (Patient not taking: Reported on 01/18/2022) 30 tablet 0   ondansetron (ZOFRAN-ODT) 4 MG disintegrating tablet Take 1 tablet (4 mg total) by mouth 2 (two) times daily as needed for nausea or vomiting. (Patient not taking: Reported on 01/18/2022) 20 tablet 0   paliperidone (INVEGA SUSTENNA) 156 MG/ML SUSY injection Inject 156 mg into the muscle once.     pantoprazole (PROTONIX) 20 MG tablet Take 1 tablet (20 mg total) by mouth daily. (Patient not taking: Reported on 01/18/2022) 30 tablet 0   polyethylene glycol powder (GLYCOLAX/MIRALAX) 17 GM/SCOOP powder Take 17 g by mouth daily as needed for moderate constipation or severe constipation. (Patient not taking: Reported on 01/18/2022) 238 g 0   polyethylene glycol (MIRALAX / GLYCOLAX) 17 g packet Take 17 g by mouth daily. (Patient not taking: Reported on 01/18/2022) 14 each 0    Lab Results: No results found for this or any previous visit (from the past 48  hour(s)).  Blood Alcohol level:  Lab Results  Component Value Date   ETH <10 01/18/2022   ETH <10 99991111    Metabolic Disorder Labs: Lab Results  Component Value Date   HGBA1C 5.4 10/31/2021   MPG 108.28 10/31/2021   MPG 131.24 10/18/2018   Lab Results  Component Value Date   PROLACTIN 43.4 02/08/2014   Lab Results   Component Value Date   CHOL 145 10/31/2021   TRIG 83 10/31/2021   HDL 33 (L) 10/31/2021   CHOLHDL 4.4 10/31/2021   VLDL 17 10/31/2021   LDLCALC 95 10/31/2021   LDLCALC NOT CALCULATED 10/18/2018    Physical Findings: AIMS:  , ,  ,  ,    CIWA:    COWS:     Musculoskeletal: Strength & Muscle Tone: within normal limits Gait & Station: normal Patient leans: N/A  Psychiatric Specialty Exam:  Presentation  General Appearance: Disheveled  Eye Contact:Good  Speech:Clear and Coherent; Normal Rate  Speech Volume:Increased  Handedness:Right   Mood and Affect  Mood:Irritable; Labile  Affect:Congruent   Thought Process  Thought Processes:Coherent; Irrevelant; Disorganized  Descriptions of Associations:Loose  Orientation:Full (Time, Place and Person)  Thought Content:Illogical; Delusions  History of Schizophrenia/Schizoaffective disorder:Yes  Duration of Psychotic Symptoms:Greater than six months  Hallucinations:Hallucinations: None  Ideas of Reference:Delusions; Paranoia  Suicidal Thoughts:Suicidal Thoughts: No  Homicidal Thoughts:Homicidal Thoughts: No   Sensorium  Memory:Immediate Fair; Recent Fair; Remote Fair  Judgment:Impaired  Insight:Poor   Executive Functions  Concentration:Poor  Attention Span:Poor  [place]   Psychomotor Activity  Psychomotor Activity:Psychomotor Activity: Normal   Assets  Assets:Financial Resources/Insurance; Social Support   Sleep  Sleep:Sleep: Poor    Physical Exam: Physical Exam Vitals reviewed.  Constitutional:      Appearance: Normal appearance.  Neurological:     Mental Status: She is alert and oriented to person, place, and time.  Psychiatric:        Mood and Affect: Mood normal.   Review of Systems  Eyes: Negative.   Psychiatric/Behavioral:  Positive for hallucinations. The patient is nervous/anxious.   All other systems reviewed and are  negative.  Blood pressure (!) 136/108, pulse 100, temperature 99.1 F (37.3 C), temperature source Oral, resp. rate 18, SpO2 99 %, unknown if currently breastfeeding. There is no height or weight on file to calculate BMI.   Treatment Plan Summary: Daily contact with patient to assess and evaluate symptoms and progress in treatment and Medication management  Continue Zyprexa 10 mg p.o. twice daily Recent adjustment to Depakote patient is currently prescribed Depakote 1000 mg p.o. twice daily valproic level due 01/27/2022  See chart for agitation protocol -Disposition pending [place] waitlist  Derrill [place], NP 01/25/2022, 12:48 PM

## 2022-01-25 NOTE — Progress Notes (Signed)
[facility] [name] Progress Note   01/25/2022    12:02 PM  Molly [name]   [id number]   Type of Contact and Topic: Bed Placement   CSW received call from [facility]. Patient remains on the wait list, no updated timetable available at this time. No further action.   Signed:  Durenda Hurt, MSW, LCSWA, LCAS 01/25/2022 12:02 PM

## 2022-01-25 NOTE — ED Notes (Addendum)
Pt awake and to the nurses desk with several requests such as toothbrush, toothpaste, mouthwash, other peoples food, socks, snacks, coffee and various other items. Advised pt we would not be able to continue to provide her with items she is destroying. Pt provided a toothbrush, toothpaste and a cup of water at this time and was advised that she would not be getting anything else until breakfast time. Pt will not allow [place] to take her vitals at this time.

## 2022-01-26 DIAGNOSIS — F201 Disorganized schizophrenia: Secondary | ICD-10-CM | POA: Diagnosis not present

## 2022-01-26 MED ORDER — LIP MEDEX EX OINT
TOPICAL_OINTMENT | CUTANEOUS | Status: DC | PRN
  Administered 2022-01-26: 1 via TOPICAL
  Filled 2022-01-26: qty 7

## 2022-01-26 NOTE — ED Notes (Signed)
Patient agitated. Yelling. Verbally abusive to staff. Slamming door.  Redirected and reassured.

## 2022-01-26 NOTE — ED Notes (Signed)
Patient continues to not be redirectable or cooperative.

## 2022-01-26 NOTE — ED Notes (Signed)
Patient yells out.

## 2022-01-26 NOTE — ED Notes (Signed)
Early in the shift patient got very agitated when we were cleaning her room and making her bed while she was in the shower. She cursed at [place] and stated she hoped we were glad to see her naked.

## 2022-01-26 NOTE — ED Notes (Signed)
She did sleep after taking her HS medications along with her PRN medications until after 5am. Then, she stated she was going back to sleep.

## 2022-01-26 NOTE — ED Notes (Signed)
Patient woke up and asked for warm blankets. We tried to get VSs on her but she refused and stated that she was going back to sleep.

## 2022-01-26 NOTE — ED Provider Notes (Signed)
Emergency Medicine Observation Re-evaluation Note  Molly [name] is a 36 y.o. female, seen on rounds today.  Pt initially presented to the ED for complaints of Insomnia Currently, the patient is sleeping.  Physical Exam  BP (!) 128/101 (BP Location: Right Wrist)   Pulse (!) 104   Temp 98.2 F (36.8 C) (Oral)   Resp 16   SpO2 98%  Physical Exam General: Sleeping Cardiac: Extremities well-perfused Lungs: Breathing is unlabored Psych: Deferred  ED Course / MDM  EKG:EKG Interpretation  Date/Time:  Wednesday January 20 2022 20:28:02 EDT Ventricular Rate:  97 PR Interval:  150 QRS Duration: 72 QT Interval:  358 QTC Calculation: 454 R Axis:   53 Text Interpretation: Normal sinus rhythm Normal ECG When compared with ECG of 02-Dec-2021 15:34, PREVIOUS ECG IS PRESENT Confirmed by Davonna Belling [phone]) on 01/21/2022 5:41:23 PM  I have reviewed the labs performed to date as well as medications administered while in observation.  Recent changes in the last 24 hours include continued intermittent agitation.  Plan  Current plan is for inpatient psychiatric admission.    Godfrey Pick, MD 01/26/22 [phone]

## 2022-01-26 NOTE — BH Assessment (Signed)
01/26/2022 @ 22:06 confirmed with "Ulice Dash" @ [place] that patient is still on the wait list. Confirmed with [place] Harte Maudie Mercury, RN) no appropriate bed at [facility].    Patient also re-faxed out to alternative hospitals for consideration of bed placement.     Destination Service Provider Address Phone Fax  [facility]  [place], [place] [address] [place]  [facility]  [address] [place] [address] [phone]  [facility]  [place], [place] [address] [phone]  [facility]  [address] [place] [address] [place]  [facility]  [address] [place] [address] [phone]  [place] [address], [place] [address] [phone]  [facility]  [address] [place] [address] [phone] [place]  [address], [place] [address] [phone] [place]  [address] [place] [address] [phone] [facility]  [address], [place] [address] [phone] [place]  [address], [place] [address] [phone] [facility]  [address], [place] [address] [phone] [facility]  [address], [place] [address] [phone] [facility]  [address], [place] [address] [phone]  [facility]  [address] [place] [address] [place]  [facility]  [address] [place] [address] [phone]  [place]  [address] [place] [address] [phone]  [facility]  [address] [place] [address] [phone]  [facility]  [address] [place] [address] [facility]  [address] [place], [place] [address] [phone]

## 2022-01-26 NOTE — Consult Note (Signed)
Patient came out of her and wanted tall of her medical records at the moment.  Nursing staff informed her that she will get her records from Medical record office.  Patient yelled, banged on the door and needed security officers intervene to keep her calm.  We will continue to monitor patient.  No positive changes noted so far.

## 2022-01-26 NOTE — ED Notes (Signed)
Patient is compliant with her medications. She is very labile during the shift.

## 2022-01-26 NOTE — ED Notes (Addendum)
Patient resting.

## 2022-01-26 NOTE — ED Notes (Signed)
Patient uncooperative with care and redirection. Difficult to redirect.  Patient verbally abusive to staff.  Yelling and cursing at staff.

## 2022-01-27 DIAGNOSIS — F201 Disorganized schizophrenia: Secondary | ICD-10-CM | POA: Diagnosis not present

## 2022-01-27 LAB — VALPROIC ACID LEVEL: Valproic Acid Lvl: 50 ug/mL (ref 50.0–100.0)

## 2022-01-27 MED ORDER — HALOPERIDOL 5 MG PO TABS
5.0000 mg | ORAL_TABLET | Freq: Two times a day (BID) | ORAL | Status: DC
  Administered 2022-01-27 – 2022-01-28 (×3): 5 mg via ORAL
  Filled 2022-01-27 (×3): qty 1

## 2022-01-27 MED ORDER — DIVALPROEX SODIUM 500 MG PO DR TAB
1250.0000 mg | DELAYED_RELEASE_TABLET | Freq: Two times a day (BID) | ORAL | Status: DC
  Administered 2022-01-27 – 2022-01-29 (×4): 1250 mg via ORAL
  Filled 2022-01-27 (×5): qty 1

## 2022-01-27 NOTE — ED Notes (Signed)
Pt in room, sitting on bet listening to music on the TV. Pt currently cooperative.

## 2022-01-27 NOTE — ED Notes (Signed)
Pt refused vitals at this time.

## 2022-01-27 NOTE — Progress Notes (Signed)
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 female patient brought in by EMS with history of schizophrenia, noncompliant with medications for unknown amount of time, unable to engage in coherent conversation, not sleeping well, and unknown current substance use.  She is currently homeless and has a legal guardian through [name]. She is closely followed by an act team.  HPI:  On evaluation today, the patient is sitting on the side of her bed.  She is calm and cooperative during this assessment.  She is disheveled.  Her eye contact is good.  Speech is clear and coherent, normal pace and normal volume.  She reports her mood is euthymic.  Affect is congruent with mood.  Thought process is coherent and disorganized.  Thought content is slightly tangential.  She denies auditory and visual hallucinations.  No indication that she is responding to internal stimuli during this assessment.  No delusions elicited during this assessment.  She denies suicidal ideations.  She denies homicidal ideations.  Patient continues to request transfer to [facility].  We discussed the process of referral to other facilities.  Patient verbalizes understanding.  She states "maybe if I call them and act calmly they will accept me."   Patient continues to have periods of agitation and aggressive behavior requiring frequent use of as needed Haldol and as needed olanzapine.   Past Psychiatric History: History significant for schizophrenia versus schizoaffective, homelessness, and noncompliance with medications.  [place] Scale:  Flowsheet Row ED from 01/18/2022 in [facility] to  Hosp-Admission (Discharged) from 10/25/2021 in [facility] ED from 10/23/2021 in [facility] EMERGENCY DEPARTMENT  C-SSRS RISK CATEGORY No Risk No Risk No Risk       Past Medical History:  Past Medical History:  Diagnosis Date   Abnormal Pap smear    ASC-cannot exclude HGSIL on Pap 02/15/2012   ASC-US on 02/03/2012 pap (associated Trichomonas infection). No reflex HPV testing performed on specimen.  Patient informed that she will need repeat Pap in one year.       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 Dennie Bible (Psych) At [facility] in  [place], [place]. 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   Qualifier: Diagnosis of  By: McDiarmid MD, Benjaman Pott USER 02/08/2009   Qualifier: Diagnosis of  By: Knox Royalty      Past Surgical History:  Procedure Laterality Date   INCISION AND DRAINAGE     pilanodal cyst   TIBIA IM NAIL INSERTION Right 10/26/2021  Procedure: INTRAMEDULLARY (IM) NAIL TIBIAL;  Surgeon: Altamese Whiteville, MD;  Location: [place];  Service: Orthopedics;  Laterality: Right;   TOOTH EXTRACTION N/A 10/07/2017   Procedure: EXTRACTION TEETH NUMBERS ONE, SEVENTEEN, NINETEEN AND THIRTY TWO;  Surgeon: Diona Browner, DDS;  Location: [place];  Service: Oral Surgery;  Laterality: N/A;   Family History:  Family History  Adopted: Yes  Problem Relation Age of Onset   Bipolar disorder Sister     Alcohol abuse Brother    Cancer Father    Diabetes Mother     Social History:  Social History   Substance and Sexual Activity  Alcohol Use No   Comment: occ     Social History   Substance and Sexual Activity  Drug Use No    Social History   Socioeconomic History   Marital status: Single    Spouse name: Not on file   Number of children: Not on file   Years of education: Not on file   Highest education level: Not on file  Occupational History   Not on file  Tobacco Use   Smoking status: Every Day    Packs/day: 2.00    Years: 10.00    Total pack years: 20.00    Types: Cigarettes   Smokeless tobacco: Never  Vaping Use   Vaping Use: Never used  Substance and Sexual Activity   Alcohol use: No    Comment: occ   Drug use: No   Sexual activity: Yes    Birth control/protection: Injection  Other Topics Concern   Not on file  Social History Narrative   Adopted   Has Guardian   Living in [place] with [name]   Transportation: Bus   Social Determinants of Health   Financial Resource Strain: Not on file  Food Insecurity: Not on file  Transportation Needs: Not on file  Physical Activity: Not on file  Stress: Not on file  Social Connections: Not on file    Sleep: Poor  Appetite:  Good  Current Medications: Current Facility-Administered Medications  Medication Dose Route Frequency Provider Last Rate Last Admin   benztropine (COGENTIN) tablet 0.5 mg  0.5 mg Oral BID Lindon Romp A, NP   0.5 mg at 01/27/22 0946   diclofenac Sodium (VOLTAREN) 1 % topical gel 4 g  4 g Topical TID PRN Fransico Meadow, MD   4 g at 01/26/22 2111   divalproex (DEPAKOTE) DR tablet 1,250 mg  1,250 mg Oral BID Lindon Romp A, NP       haloperidol (HALDOL) tablet 5 mg  5 mg Oral Q6H PRN Lindon Romp A, NP   5 mg at 01/26/22 2105   Or   haloperidol lactate (HALDOL) injection 5 mg  5 mg Intramuscular Q6H PRN Lindon Romp A, NP   5 mg at 01/27/22 1323   haloperidol (HALDOL)  tablet 5 mg  5 mg Oral BID Lindon Romp A, NP   5 mg at 01/27/22 1132   hydrocerin (EUCERIN) cream   Topical Daily PRN Deno Etienne, DO       lip balm (CARMEX) ointment   Topical PRN Dorie Rank, MD   1 Application at 08/65/78 2111   lisinopril (ZESTRIL) tablet 5 mg  5 mg Oral Daily Hayden Rasmussen, MD   5 mg at 01/27/22 0947   LORazepam (ATIVAN) tablet 1 mg  1 mg Oral TID PRN Delora Fuel, MD   1 mg at 01/27/22 1155   melatonin tablet 3 mg  3 mg Oral QHS Mesner, Roy Tokarz, MD   3 mg at 01/26/22 2105   nicotine (NICODERM CQ - dosed in mg/24 hours) patch 21 mg  21 mg Transdermal Daily Molpus, John, MD   21 mg at 01/27/22 0947   OLANZapine zydis (ZYPREXA) disintegrating tablet 10 mg  10 mg Oral BID Eligha Bridegroom, NP   10 mg at 01/27/22 0944   OLANZapine zydis (ZYPREXA) disintegrating tablet 10 mg  10 mg Oral Q12H PRN Nira Conn A, NP   10 mg at 01/27/22 0137   polyethylene glycol (MIRALAX / GLYCOLAX) packet 17 g  17 g Oral Daily Nira Conn A, NP   17 g at 01/27/22 3329   Current Outpatient Medications  Medication Sig Dispense Refill   cloZAPine (CLOZARIL) 100 MG tablet Take 100 mg by mouth at bedtime. Taking with 50 mg tablet = 150 mg at bedtime     clozapine (CLOZARIL) 50 MG tablet Take 50 mg by mouth at bedtime. Taking with the 100 mg = 150 mg at bedtime     divalproex (DEPAKOTE ER) 500 MG 24 hr tablet Take 1,000 mg by mouth 2 (two) times daily.     OLANZapine (ZYPREXA) 20 MG tablet Take 20 mg by mouth at bedtime.     ascorbic acid (VITAMIN C) 500 MG tablet Take 1 tablet (500 mg total) by mouth daily. (Patient not taking: Reported on 01/18/2022) 30 tablet 0   clonazePAM (KLONOPIN) 0.5 MG tablet Take 1 tablet (0.5 mg total) by mouth 2 (two) times daily. 30 tablet 0   cloZAPine (CLOZARIL) 25 MG tablet Take 3 tablets (75 mg total) by mouth at bedtime. (Patient not taking: Reported on 01/18/2022) 30 tablet 0   clozapine (CLOZARIL) 50 MG tablet Take 1 tablet (50 mg total) by mouth daily. (Patient not  taking: Reported on 01/18/2022) 30 tablet 0   diphenhydrAMINE (BENADRYL) 25 MG tablet Take 2 tablets (50 mg total) by mouth once as needed for up to 1 dose (Agitation refractory to Zyprexa). (Patient not taking: Reported on 01/18/2022) 60 tablet 0   divalproex (DEPAKOTE) 250 MG DR tablet Take 5 tablets (1,250 mg total) by mouth every 12 (twelve) hours. (Patient not taking: Reported on 01/18/2022) 30 tablet 0   docusate sodium (COLACE) 100 MG capsule Take 1 capsule (100 mg total) by mouth daily. (Patient not taking: Reported on 01/18/2022) 10 capsule 0   naloxegol oxalate (MOVANTIK) 12.5 MG TABS tablet Take 1 tablet  by mouth 2 (two) times daily with a meal. (Patient not taking: Reported on 01/18/2022) 30 tablet 0   nicotine polacrilex (NICORETTE) 2 MG gum Take 1 each (2 mg total) by mouth as needed for smoking cessation. (Patient not taking: Reported on 01/18/2022) 100 tablet 0   OLANZapine (ZYPREXA) 15 MG tablet Take 1 tablet (15 mg total) by mouth at bedtime. (Patient not taking: Reported on 01/18/2022) 30 tablet 0   OLANZapine (ZYPREXA) 5 MG tablet Take 1 tablet (5 mg total) by mouth daily. (Patient not taking: Reported on 01/18/2022) 30 tablet 0   OLANZapine (ZYPREXA) 10 MG tablet Take 1 tablet (10 mg total) by mouth 2 (two) times daily as needed (agitation and psychosis). (Patient not taking: Reported on 01/18/2022) 30 tablet 0   ondansetron (ZOFRAN-ODT) 4 MG disintegrating tablet Take 1 tablet (4 mg total) by mouth 2 (two) times daily as needed for nausea or vomiting. (Patient not taking: Reported on 01/18/2022) 20 tablet 0   paliperidone (INVEGA SUSTENNA) 156 MG/ML SUSY injection Inject 156  mg into the muscle once.     pantoprazole (PROTONIX) 20 MG tablet Take 1 tablet (20 mg total) by mouth daily. (Patient not taking: Reported on 01/18/2022) 30 tablet 0   polyethylene glycol powder (GLYCOLAX/MIRALAX) 17 GM/SCOOP powder Take 17 g by mouth daily as needed for moderate constipation or severe constipation.  (Patient not taking: Reported on 01/18/2022) 238 g 0   polyethylene glycol (MIRALAX / GLYCOLAX) 17 g packet Take 17 g by mouth daily. (Patient not taking: Reported on 01/18/2022) 14 each 0    Lab Results:  Results for orders placed or performed during the hospital encounter of 01/18/22 (from the past 48 hour(s))  Valproic acid level     Status: None   Collection Time: 01/27/22 10:55 AM  Result Value Ref Range   Valproic Acid Lvl 50 50.0 - 100.0 ug/mL    Comment: Performed at [facility], [address] [place], [place] [id number]    Blood Alcohol level:  Lab Results  Component Value Date   [facility] 01/18/20[facility]   ETH <10 10/25/2021    Physical Findings:  CIWA:    COWS:     Musculoskeletal: Strength & Muscle Tone: within normal limits Gait & Station: normal Patient leans: N/A  Psychiatric Specialty Exam:  Presentation  General Appearance: Disheveled  Eye Contact:Good  Speech:Clear and Coherent; Normal Rate  Speech Volume:Normal  Handedness:Right   Mood and Affect  Mood:Euthymic  Affect:Congruent   Thought Process  Thought Processes:Coherent; Irrevelant; Disorganized  Descriptions of Associations:Tangential  Orientation:Full (Time, Place and Person)  Thought Content:Illogical  History of Schizophrenia/Schizoaffective disorder:Yes  Duration of Psychotic Symptoms:Greater than six months  Hallucinations:Hallucinations: None   Ideas of Reference:None  Suicidal Thoughts:Suicidal Thoughts: No   Homicidal Thoughts:Homicidal Thoughts: No    Sensorium  Memory:Immediate Fair; Recent Fair; Remote Fair  Judgment:Impaired  Insight:Poor   Executive Functions  Concentration:Fair  Attention Span:Fair  Recall:Fair  Fund of Knowledge:Fair  Language:Fair   Psychomotor Activity  Psychomotor Activity:Psychomotor Activity: Normal    Assets  Assets:Social Support; Financial Resources/Insurance   Sleep  Sleep:Sleep:  Fair     Physical Exam: Physical Exam Constitutional:      General: She is not in acute distress.    Appearance: She is not ill-appearing, toxic-appearing or diaphoretic.  HENT:     Right Ear: External ear normal.     Left Ear: External ear normal.  Eyes:     General:        Right eye: No discharge.        Left eye: No discharge.  Cardiovascular:     Rate and Rhythm: Normal rate.  Pulmonary:     Effort: Pulmonary effort is normal. No respiratory distress.  Musculoskeletal:        General: Normal range of motion.     Cervical back: Normal range of motion.  Neurological:     Mental Status: She is alert and oriented to person, place, and time.  Psychiatric:        Mood and Affect: Mood is not anxious or depressed.        Behavior: Behavior is cooperative.        Thought Content: Thought content does not include homicidal or suicidal ideation.    Review of Systems  Respiratory:  Negative for cough and shortness of breath.   Cardiovascular:  Negative for chest pain.  Gastrointestinal:  Negative for diarrhea, nausea and vomiting.  Psychiatric/Behavioral:  Negative for depression, hallucinations, memory loss and suicidal ideas.  All other systems reviewed and are negative.  Blood pressure (!) 141/75, pulse 81, temperature 98.7 F (37.1 C), temperature source Oral, resp. rate 18, SpO2 100 %, unknown if currently breastfeeding. There is no height or weight on file to calculate BMI.   Medical Decision Making: Molly [name] is a 36 y.o. female patient brought in by EMS with history of schizophrenia, noncompliant with medications for unknown amount of time, unable to engage in coherent conversation, not sleeping well, and unknown current substance use.  She is currently homeless and has a legal guardian through [name]. She is closely followed by an act team.  VPA level 50 on Depakote 1000 mg BID. Patient has historically required depakote 1250 mg BID. Will increase to 1250 mg BID and  check VPA level in 3 days (01/30/22).   Patient has been consistently receiving haldol 5 mg oral three to four times a day for agitation/aggressive behavior. Will start haldol 5 mg oral BID and continue with haldol 5 mg oral or IM every 6 hours prn. Plan to increase scheduled haldol in 1-2 days.  Increase Depakote to 1250 mg BID for mood stability Start Haldol 5 mg oral BID for schizoaffective disorder Continue Zyprexa 10mg  BID for schizoaffective disorder Continue benztropine 0.5 mg BID for EPS prophylaxis  Agitation medications: Haldol 5 mg oral or IM every 6 hours prn   Olanzapine zydis every 12 hours prn  Recommend not using additional antipsychotics for agitation  Disposition: Recommend psychiatric Inpatient admission when medically cleared. CSW to continue seeking placement at [facility].   Problem 1: Schizoaffective Disorder   [facility], NP 01/27/2022, 6:53 PM

## 2022-01-27 NOTE — ED Provider Notes (Signed)
Emergency Medicine Observation Re-evaluation Note  Molly [name] is a 36 y.o. female, seen on rounds today.  Pt initially presented to the ED for complaints of Insomnia Currently, the patient is sleeping.  Physical Exam  BP (!) 140/92 (BP Location: Right Arm)   Pulse 98   Temp 98.5 F (36.9 C) (Oral)   Resp 18   SpO2 99%  Physical Exam General: No acute distress Cardiac: Regular rate Lungs: No respiratory distress Psych: Asleep  ED Course / MDM  EKG:EKG Interpretation  Date/Time:  Wednesday January 20 2022 20:28:02 EDT Ventricular Rate:  97 PR Interval:  150 QRS Duration: 72 QT Interval:  358 QTC Calculation: 454 R Axis:   53 Text Interpretation: Normal sinus rhythm Normal ECG When compared with ECG of 02-Dec-2021 15:34, PREVIOUS ECG IS PRESENT Confirmed by Davonna Belling [phone]) on 01/21/2022 5:41:23 PM  I have reviewed the labs performed to date as well as medications administered while in observation.  Recent changes in the last 24 hours include continued intermittent agitation.  Plan  Current plan is for inpatient psychiatric hospitalization.    Cristie Hem, MD 01/27/22 [phone]

## 2022-01-27 NOTE — ED Notes (Signed)
Patient is sleeping. Will get VSs when she wakes up.

## 2022-01-27 NOTE — ED Notes (Signed)
Pt with increased agitation and aggression. When pt given lunch tray, she shoved everything into the floor. Pt screaming. IM medications administered.

## 2022-01-27 NOTE — ED Notes (Addendum)
Patient has been intermittent crying out. The NT and I went in there separately to ask what was wrong and she told us to get out of her [place] room!!!  MHT went to see if she would tell him what was going on. She stated no but ask for medication. Administered PRN zyprexa. Will continue to monitor.

## 2022-01-27 NOTE — ED Notes (Signed)
Pt given paper & marker per her request.

## 2022-01-27 NOTE — ED Notes (Signed)
Pt became upset, crying. Refusing to talk about it. Given PRN ativan.

## 2022-01-28 DIAGNOSIS — F201 Disorganized schizophrenia: Secondary | ICD-10-CM | POA: Diagnosis not present

## 2022-01-28 MED ORDER — HALOPERIDOL 5 MG PO TABS
10.0000 mg | ORAL_TABLET | Freq: Two times a day (BID) | ORAL | Status: DC
  Administered 2022-01-28 – 2022-01-29 (×2): 10 mg via ORAL
  Filled 2022-01-28 (×2): qty 2

## 2022-01-28 NOTE — ED Notes (Signed)
Patient alert this shift.  Disorganized. Labile. Patient medication compliant. No suicidal ideation noted. No homicidal ideation noted.  Yelling and uncooperative at times, redirected.

## 2022-01-28 NOTE — Progress Notes (Signed)
This CSW confirmed with Stanton Kidney at [facility] that pt remains on the waitlist. CSW sent additional information for pt to be reviewed for pt to be reviewed for the priority list.   CSW sent referral to out of network providers. Pt meets inpatient criteria per Garrison Columbus, NP. There are no available beds at [facility] per [facility].   Destination Service Provider Address Phone Fax  [facility]  [place], [place] [address] [place]  [facility]  [address] [place] [address] [phone]  [facility]  [place], [place] [address] [phone]  [facility]  [place] [address] [place] [address] [place]  [facility]  [address] [place] [address] [phone]  [place] [address], [place] [address] [phone]  [facility]  [address], [place] [address] [phone] [place]  [address] [place] [address] [phone] [place]  [address], [place] [address] [phone] [facility]  [address] [place] [address] [phone] [place]  [address] [place] [address] [phone] [facility]  [address] [place] [address] [phone] [facility]  [address] [place] [address] [phone] [facility]  [address] [place] [address] [phone]  [facility]  [address] [place] [address] [place]  [facility]  [place] [place] [address] [phone]  [place]  [address] [place] [address] [phone]  [facility]  [address] [place] [address] [phone]  [facility]  [address], [place] [address] [facility]  [address], [place] [address] [phone] [place] [address] [place] [address] [phone]  [facility]  [address] [place], [place] [address] [phone]  [facility]  [address] [place] [address] [facility]  [address], [place] [address] [phone]  [facility]  [address] [place] [address] [phone]  [facility]  [address] [place] [address] [phone] [place], MSW, [facility] 01/28/2022 11:53 PM

## 2022-01-28 NOTE — Consult Note (Signed)
[facility] Consult   Reason for Consult: Psych consult Referring Physician: Dr. Oswald Hillock Patient Identification: Molly [name] MRN:  [id number] Principal Diagnosis: Disorganized schizophrenia [facility]) Diagnosis:  Principal Problem:   Disorganized schizophrenia ([place]) Active Problems:   Schizoaffective disorder-chronic with exacerbation ([place])   Total Time spent with patient: 30 minutes  Subjective:   Molly [name] is a 36 y.o. female patient.  HPI: As per initial intake notes from [place] [facility] ED: Molly [name] is a 36 y.o. female patient brought in by EMS with history of schizophrenia, noncompliant with medications for unknown amount of time, unable to engage in coherent conversation, not sleeping well, and unknown current substance use.  She is currently homeless and has a legal guardian through [place]. She is closely followed by an act team.  Assessment: Patient is seen face-to-face and examined in her room in [place] [facility] ED.  She appears bizarre and disheveled.  Using loose ripped off clothing strands to wrap her hair.  Chart reviewed and findings shared with the treatment team and consults with Dr. Dwyane Dee. Alert and oriented x 3, able to answer a few of the questions ask initially and then covered her head with the blanket.  Screaming at this provider, states, "give me 2 more warm blankets, it is cold in here."  2 warm blanket provided to patient as per request.  Speech clear with normal volume.  Patient then remove the blanket from her head and started talking with this provider.  States, "Who are you and what the you want."  This provider reintroduce myself again and asked patient what brought her to the hospital, reported that she was running the streets and was out of her medication and the police brought her into the hospital.  Then added, "I want you to give me my stuff back, my Social Security, my Medicaid card, and my money."  Made patient aware that I do not have any of  her listed items.  Presents with irritable and worthless mood and blunted affect.  Thought processes disorganized and thought content illogical.  Patient continues on Depakote 1,250 mg p.o. twice daily for mood stabilization, VPA to be obtained on 01/30/2022.  Haldol 5 mg p.o. or IM every 6 hours as needed for agitation, lorazepam tablet 1 mg, 3 times daily as needed for anxiety, Zyprexa 10 mg p.o. twice daily and Zyprexa 10 mg p.o. every 12 hours as needed for psychotic symptoms or mania.  No med adjustment at this time.  Patient denies suicidal ideation, or homicidal ideation. Reporting hearing voices that are saying nothing and seeing specks of light.  Reports her sleep was good last night.  Reports good appetite and drinking enough fluid for hydration.  Reports drinking some beer occasionally and denies drug use. Urine toxicology obtained on 01/20/2022 is negative and BAL on 01/18/2022 is less than 10.  Denies access to firearms or being followed by a therapist/psychiatrist.  Disposition: Patient is not psych cleared.  She continues to require psychiatric inpatient admission when medically cleared. CSW to continue seeking placement at [facility].  [place] [facility] treatment team and [facility] physician aware of patient disposition.   Past Psychiatric History: History significant for schizophrenia versus schizoaffective, homelessness, and noncompliance with medications.  Risk to Self:  Yes Risk to Others: Yes  Prior Inpatient Therapy:  Yes Prior Outpatient Therapy:  Unable to determine  Past Medical History:  Past Medical History:  Diagnosis Date   Abnormal Pap smear  ASC-cannot exclude HGSIL on Pap 02/15/2012   ASC-US on 02/03/2012 pap (associated Trichomonas infection). No reflex HPV testing performed on specimen.  Patient informed that she will need repeat Pap in one year.       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 Dennie Bible (Psych) At [facility] in  [place], [place]. 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   Qualifier: Diagnosis of  By: McDiarmid MD, Benjaman Pott USER 02/08/2009   Qualifier: Diagnosis of  By: Knox Royalty      Past Surgical History:  Procedure Laterality Date   INCISION AND DRAINAGE     pilanodal cyst   TIBIA IM NAIL INSERTION Right 10/26/2021   Procedure: INTRAMEDULLARY (IM) NAIL TIBIAL;  Surgeon: Myrene Galas, MD;  Location: [facility] OR;  Service: Orthopedics;  Laterality: Right;   TOOTH EXTRACTION N/A 10/07/2017   Procedure: EXTRACTION TEETH NUMBERS ONE, SEVENTEEN, NINETEEN AND THIRTY TWO;  Surgeon: Ocie Doyne, DDS;  Location: [facility] OR;  Service: Oral Surgery;  Laterality: N/A;   Family History:  Family History  Adopted: Yes  Problem Relation Age of Onset   Bipolar disorder Sister    Alcohol abuse Brother    Cancer Father    Diabetes Mother    Family Psychiatric  History: Sister diagnosed with bipolar disorder, and brother diagnosed with alcohol abuse.  Social History:  Social History   Substance and Sexual Activity  Alcohol Use No   Comment: occ     Social History   Substance and Sexual Activity  Drug Use No    Social History   Socioeconomic History   Marital status: Single    Spouse name: Not on file   Number of children: Not on file   Years of education: Not on file   Highest education level: Not on file  Occupational History   Not on file  Tobacco Use   Smoking status: Every Day     Packs/day: 2.00    Years: 10.00    Total pack years: 20.00    Types: Cigarettes   Smokeless tobacco: Never  Vaping Use   Vaping Use: Never used  Substance and Sexual Activity   Alcohol use: No    Comment: occ   Drug use: No   Sexual activity: Yes    Birth control/protection: Injection  Other Topics Concern   Not on file  Social History Narrative   Adopted   Has Guardian   Living in [place] home with Bradly Chris   Transportation: Bus   Social Determinants of Health   Financial Resource Strain: Not on file  Food Insecurity: Not on file  Transportation Needs: Not on file  Physical Activity: Not on file  Stress: Not on file  Social Connections: Not on file   Additional Social History:    Allergies:   Allergies  Allergen  Reactions   Abilify [Aripiprazole] Other (See Comments)    Thinks it's nasty- does not want it.  Injection is ok.      Labs:  Results for orders placed or performed during the hospital encounter of 01/18/22 (from the past 48 hour(s))  Valproic acid level     Status: None   Collection Time: 01/27/22 10:55 AM  Result Value Ref Range   Valproic Acid Lvl 50 50.0 - 100.0 ug/mL    Comment: Performed at [facility], [address] [place], [place] [address]    Current Facility-Administered Medications  Medication Dose Route Frequency Provider Last Rate Last Admin   benztropine (COGENTIN) tablet 0.5 mg  0.5 mg Oral BID Nira Conn A, NP   0.5 mg at 01/28/22 1056   diclofenac Sodium (VOLTAREN) 1 % topical gel 4 g  4 g Topical TID PRN Rondel Baton, MD   4 g at 01/28/22 1342   divalproex (DEPAKOTE) DR tablet 1,250 mg  1,250 mg Oral BID Nira Conn A, NP   1,250 mg at 01/28/22 1056   haloperidol (HALDOL) tablet 5 mg  5 mg Oral Q6H PRN Nira Conn A, NP   5 mg at 01/26/22 2105   Or   haloperidol lactate (HALDOL) injection 5 mg  5 mg Intramuscular Q6H PRN Nira Conn A, NP   5 mg at 01/27/22 1323   haloperidol (HALDOL) tablet 5  mg  5 mg Oral BID Nira Conn A, NP   5 mg at 01/28/22 1056   hydrocerin (EUCERIN) cream   Topical Daily PRN Melene Plan, DO       lip balm (CARMEX) ointment   Topical PRN Linwood Dibbles, MD   1 Application at 01/26/22 2111   lisinopril (ZESTRIL) tablet 5 mg  5 mg Oral Daily Terrilee Files, MD   5 mg at 01/27/22 0947   LORazepam (ATIVAN) tablet 1 mg  1 mg Oral TID PRN Dione Booze, MD   1 mg at 01/27/22 2050   melatonin tablet 3 mg  3 mg Oral QHS Mesner, Jason, MD   3 mg at 01/27/22 2049   nicotine (NICODERM CQ - dosed in mg/24 hours) patch 21 mg  21 mg Transdermal Daily Molpus, John, MD   21 mg at 01/28/22 1000   OLANZapine zydis (ZYPREXA) disintegrating tablet 10 mg  10 mg Oral BID Eligha Bridegroom, NP   10 mg at 01/28/22 1056   OLANZapine zydis (ZYPREXA) disintegrating tablet 10 mg  10 mg Oral Q12H PRN Nira Conn A, NP   10 mg at 01/27/22 0137   polyethylene glycol (MIRALAX / GLYCOLAX) packet 17 g  17 g Oral Daily Nira Conn A, NP   17 g at 01/28/22 1057   Current Outpatient Medications  Medication Sig Dispense Refill   cloZAPine (CLOZARIL) 100 MG tablet Take 100 mg by mouth at bedtime. Taking with 50 mg tablet = 150 mg at bedtime     clozapine (CLOZARIL) 50 MG tablet Take 50 mg by mouth at bedtime. Taking with the 100 mg = 150 mg at bedtime     divalproex (DEPAKOTE ER) 500 MG 24 hr tablet Take 1,000 mg by mouth 2 (two) times daily.     OLANZapine (ZYPREXA) 20 MG tablet Take 20 mg by mouth at bedtime.     ascorbic acid (VITAMIN C) 500 MG tablet Take 1 tablet (500 mg total) by mouth daily. (Patient not taking: Reported on 01/18/2022) 30 tablet 0   clonazePAM (KLONOPIN)  0.5 MG tablet Take 1 tablet (0.5 mg total) by mouth 2 (two) times daily. 30 tablet 0   cloZAPine (CLOZARIL) 25 MG tablet Take 3 tablets (75 mg total) by mouth at bedtime. (Patient not taking: Reported on 01/18/2022) 30 tablet 0   clozapine (CLOZARIL) 50 MG tablet Take 1 tablet (50 mg total) by mouth daily. (Patient not taking:  Reported on 01/18/2022) 30 tablet 0   diphenhydrAMINE (BENADRYL) 25 MG tablet Take 2 tablets (50 mg total) by mouth once as needed for up to 1 dose (Agitation refractory to Zyprexa). (Patient not taking: Reported on 01/18/2022) 60 tablet 0   divalproex (DEPAKOTE) 250 MG DR tablet Take 5 tablets (1,250 mg total) by mouth every 12 (twelve) hours. (Patient not taking: Reported on 01/18/2022) 30 tablet 0   docusate sodium (COLACE) 100 MG capsule Take 1 capsule (100 mg total) by mouth daily. (Patient not taking: Reported on 01/18/2022) 10 capsule 0   naloxegol oxalate (MOVANTIK) 12.5 MG TABS tablet Take 1 tablet  by mouth 2 (two) times daily with a meal. (Patient not taking: Reported on 01/18/2022) 30 tablet 0   nicotine polacrilex (NICORETTE) 2 MG gum Take 1 each (2 mg total) by mouth as needed for smoking cessation. (Patient not taking: Reported on 01/18/2022) 100 tablet 0   OLANZapine (ZYPREXA) 15 MG tablet Take 1 tablet (15 mg total) by mouth at bedtime. (Patient not taking: Reported on 01/18/2022) 30 tablet 0   OLANZapine (ZYPREXA) 5 MG tablet Take 1 tablet (5 mg total) by mouth daily. (Patient not taking: Reported on 01/18/2022) 30 tablet 0   OLANZapine (ZYPREXA) 10 MG tablet Take 1 tablet (10 mg total) by mouth 2 (two) times daily as needed (agitation and psychosis). (Patient not taking: Reported on 01/18/2022) 30 tablet 0   ondansetron (ZOFRAN-ODT) 4 MG disintegrating tablet Take 1 tablet (4 mg total) by mouth 2 (two) times daily as needed for nausea or vomiting. (Patient not taking: Reported on 01/18/2022) 20 tablet 0   paliperidone (INVEGA SUSTENNA) 156 MG/ML SUSY injection Inject 156 mg into the muscle once.     pantoprazole (PROTONIX) 20 MG tablet Take 1 tablet (20 mg total) by mouth daily. (Patient not taking: Reported on 01/18/2022) 30 tablet 0   polyethylene glycol powder (GLYCOLAX/MIRALAX) 17 GM/SCOOP powder Take 17 g by mouth daily as needed for moderate constipation or severe constipation. (Patient not  taking: Reported on 01/18/2022) 238 g 0   polyethylene glycol (MIRALAX / GLYCOLAX) 17 g packet Take 17 g by mouth daily. (Patient not taking: Reported on 01/18/2022) 14 each 0    Musculoskeletal: Strength & Muscle Tone: within normal limits Gait & Station: normal Patient leans: N/A  Psychiatric Specialty Exam:  Presentation  General Appearance: Bizarre; Disheveled  Eye Contact:Fair  Speech:Clear and Coherent; Normal Rate  Speech Volume:Normal  Handedness:Right  Mood and Affect  Mood:Irritable; Worthless  Affect:Blunt  Thought Consulting civil engineerrocess  Thought Processes:Coherent; Disorganized  Descriptions of Associations:Tangential  Orientation:Full (Time, Place and Person)  Thought Content:Illogical  History of Schizophrenia/Schizoaffective disorder:Yes  Duration of Psychotic Symptoms:Greater than six months  Hallucinations:Hallucinations: Auditory; Visual Description of Auditory Hallucinations: "Hearing voices saying nothing." Description of Visual Hallucinations: "I am seeing specks of light>"  Ideas of Reference:None  Suicidal Thoughts:Suicidal Thoughts: No  Homicidal Thoughts:Homicidal Thoughts: No  Sensorium  Memory:Immediate Fair; Recent Fair; Remote Fair  Judgment:Impaired  Insight:Poor  Executive Functions  Concentration:Fair  Attention Span:Fair  Recall:Fair  Fund of Knowledge:Poor  Language:Fair  Psychomotor Activity  Psychomotor Activity:Psychomotor Activity: Normal  Assets  Assets:Social Support; Health and safety inspector; Physical Health  Sleep  Sleep:Sleep: Good Number of Hours of Sleep: 7  Physical Exam: Physical Exam Vitals and nursing note reviewed.  Constitutional:      Appearance: She is obese.  HENT:     Head: Normocephalic.     Right Ear: External ear normal.     Left Ear: External ear normal.     Nose: Nose normal.     Mouth/Throat:     Mouth: Mucous membranes are moist.     Pharynx: Oropharynx is clear.  Eyes:      Extraocular Movements: Extraocular movements intact.     Conjunctiva/sclera: Conjunctivae normal.     Pupils: Pupils are equal, round, and reactive to light.  Cardiovascular:     Rate and Rhythm: Normal rate.     Pulses: Normal pulses.     Comments: Blood pressure 122/91, pulse 82.  Nursing staff to recheck vital signs. Pulmonary:     Effort: Pulmonary effort is normal.  Abdominal:     Palpations: Abdomen is soft.  Genitourinary:    Comments: Deferred Musculoskeletal:        General: Normal range of motion.     Cervical back: Normal range of motion.  Skin:    General: Skin is warm.  Neurological:     General: No focal deficit present.     Mental Status: She is alert and oriented to person, place, and time.  Psychiatric:     Comments: Agitated    Review of Systems  Constitutional: Negative.  Negative for chills and fever.  HENT: Negative.  Negative for hearing loss and tinnitus.   Eyes: Negative.  Negative for blurred vision and double vision.  Respiratory: Negative.  Negative for cough, sputum production, shortness of breath and wheezing.   Cardiovascular: Negative.  Negative for chest pain and palpitations.       Blood pressure 122/91, pulse 82.  Nursing staff to recheck vital signs.  Gastrointestinal: Negative.  Negative for abdominal pain, heartburn, nausea and vomiting.  Genitourinary: Negative.  Negative for dysuria, frequency and urgency.  Musculoskeletal: Negative.  Negative for myalgias and neck pain.  Skin: Negative.  Negative for itching and rash.  Neurological: Negative.  Negative for dizziness, tingling, tremors and headaches.  Endo/Heme/Allergies: Negative.  Negative for environmental allergies and polydipsia. Does not bruise/bleed easily.       Abilify [Aripiprazole] Abilify [Aripiprazole]  Other (See Comments) Low Intolerance 01/27/2014    Psychiatric/Behavioral:  Positive for hallucinations.    Blood pressure (!) 122/91, pulse 82, temperature (!) 97.5 F  (36.4 C), temperature source Oral, resp. rate 20, SpO2 100 %, unknown if currently breastfeeding. There is no height or weight on file to calculate BMI.  Treatment Plan Summary: Daily contact with patient to assess and evaluate symptoms and progress in treatment and Medication management  Disposition: Recommend psychiatric Inpatient admission when medically cleared.  Cecilie Lowers, FNP 01/28/2022 1:52 PM

## 2022-01-28 NOTE — ED Notes (Signed)
Patient screaming.  Uncooperative.

## 2022-01-28 NOTE — ED Notes (Signed)
Patient resting comfortably

## 2022-01-28 NOTE — ED Provider Notes (Signed)
Emergency Medicine Observation Re-evaluation Note  SHAYLEN NEPHEW is a 36 y.o. female, seen on rounds today.  Pt initially presented to the ED for complaints of Insomnia Currently, the patient is resting comfortable in no acute distress.  Physical Exam  BP (!) 122/91 (BP Location: Right Arm)   Pulse 82   Temp (!) 97.5 F (36.4 C) (Oral)   Resp 20   SpO2 100%  Physical Exam General: Appears to be resting comfortably in bed, no acute distress. Cardiac: Regular rate, normal heart rate, non-emergent blood pressure for this morning's vitals. Lungs: No increased work of breathing.  Equal chest rise appreciated Psych: Calm, asleep in bed.   ED Course / MDM  EKG:EKG Interpretation  Date/Time:  Wednesday January 20 2022 20:28:02 EDT Ventricular Rate:  97 PR Interval:  150 QRS Duration: 72 QT Interval:  358 QTC Calculation: 413 R Axis:   53 Text Interpretation: Normal sinus rhythm Normal ECG When compared with ECG of 02-Dec-2021 15:34, PREVIOUS ECG IS PRESENT Confirmed by Davonna Belling [phone]) on 01/21/2022 5:41:23 PM  I have reviewed the labs performed to date as well as medications administered while in observation.    Plan  Current plan is for psychiatric hospitalization, no available beds per last Berkey S W note.  We will continue psychiatric hold protocol.Tretha Sciara, MD 01/28/22 [id number]

## 2022-01-29 DIAGNOSIS — F201 Disorganized schizophrenia: Secondary | ICD-10-CM | POA: Diagnosis not present

## 2022-01-29 MED ORDER — DIVALPROEX SODIUM ER 500 MG PO TB24: 1000.0000 mg | ORAL_TABLET | Freq: Two times a day (BID) | ORAL | 0 refills | Status: DC

## 2022-01-29 MED ORDER — NICOTINE 21 MG/24HR TD PT24: 21.0000 mg | MEDICATED_PATCH | Freq: Every day | TRANSDERMAL | 0 refills | Status: DC

## 2022-01-29 MED ORDER — HALOPERIDOL 10 MG PO TABS: 10.0000 mg | ORAL_TABLET | Freq: Two times a day (BID) | ORAL | 0 refills | Status: DC

## 2022-01-29 MED ORDER — BENZTROPINE MESYLATE 0.5 MG PO TABS: 0.5000 mg | ORAL_TABLET | Freq: Two times a day (BID) | ORAL | 0 refills | Status: DC

## 2022-01-29 MED ORDER — DIVALPROEX SODIUM ER 250 MG PO TB24: 250.0000 mg | ORAL_TABLET | Freq: Every day | ORAL | 2 refills | Status: DC

## 2022-01-29 MED ORDER — OLANZAPINE 10 MG PO TBDP: 10.0000 mg | ORAL_TABLET | Freq: Two times a day (BID) | ORAL | 0 refills | Status: DC

## 2022-01-29 MED ORDER — DIVALPROEX SODIUM 250 MG PO DR TAB: 250.0000 mg | DELAYED_RELEASE_TABLET | Freq: Two times a day (BID) | ORAL | 0 refills | Status: DC

## 2022-01-29 MED ORDER — LISINOPRIL 5 MG PO TABS: 5.0000 mg | ORAL_TABLET | Freq: Every day | ORAL | 0 refills | Status: DC

## 2022-01-29 NOTE — Progress Notes (Signed)
Transition of Care [facility]) - Emergency Department Mini Assessment   Patient Details  Name: Molly [name] MRN: [id number] Date of Birth: 09/11/1985  [facility] CM/SW Contact:    Kimber Relic, [name] Phone Number: 01/29/2022, 10:58 AM   Clinical Narrative: Pt was brought in for HI and reportedly not taking medications. Pt has been under psych care and is now cleared from their care, per Lindon Romp, NP. This CSW went to speak with the pt about her d/c plans which includes returning to the [facility]. The pt is agreeable to this plan. This CSW provided Beth, Therapist, sports, with two bus passes to provide to the pt along with her belongings. The pt inquired about receiving all of her paperwork at d/c. This CSW assured her that her paperwork would be given to her at d/c. RN is aware of IVC and will rescind. This CSW has attached shelter resources to the pt's AVS. TOC signing off.    ED Mini Assessment: What brought you to the Emergency Department? : HI - reportedly not taking meds  Barriers to Discharge: No Barriers Identified     Means of departure: Public Transportation       Patient Contact and Communications Key Contact 1: Patient   Spoke with: Patient Contact Date: 01/29/22,   Contact time: 1045             Admission diagnosis:  Insomnia, Anxiety Patient Active Problem List   Diagnosis Date Noted   Tibia/fibula fracture, right, closed, initial encounter 10/25/2021   Involuntary commitment 08/19/2021   Hypersomnolence 10/30/2019   Schizophrenia ([place]) 10/17/2018   Post-operative state 10/07/2017   Disorganized schizophrenia ([place])    Psychoses ([place])    Borderline intellectual functioning    Elevated WBC count 04/07/2014   Noncompliance with medication regimen 04/07/2014   Schizoaffective disorder-chronic with exacerbation ([place]) 02/06/2014   Schizoaffective disorder ([place]) 01/19/2014   Aggressive behavior 08/16/2013   Papular rash, generalized 09/01/2012   Amenorrhea  04/25/2012   ASC-cannot exclude HGSIL on Pap 02/15/2012   Screening for malignant neoplasm of the cervix 02/01/2012   CONDYLOMA ACUMINATA, 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 [place] [address] Phone: [phone] Fax: [phone]  [facility] [address] [place] [address] Phone: [phone] Fax: [phone]

## 2022-01-29 NOTE — ED Notes (Signed)
Peer nurse don't wake pt up for  AM vitals

## 2022-01-29 NOTE — ED Notes (Signed)
Refused Vitals 

## 2022-01-29 NOTE — Discharge Summary (Signed)
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 female patient brought in by EMS with history of schizophrenia, noncompliant with medications for unknown amount of time, unable to engage in coherent conversation, not sleeping well, and unknown current substance use.  She is currently homeless and has a legal guardian through [place]. She is closely followed by an act team.  On evaluation today, the patient is sitting up on the side of her bed.  She is pleasant and cooperative.  Eye contact is good.  She is alert and oriented x 4.  Speech is clear and coherent, normal pace and normal volume.  She reports her mood is euthymic.  Affect is congruent with mood.  Thought process is coherent.  Thought content is logical.  She denies auditory and visual hallucinations.  No indication that she is responding to internal stimuli during this assessment.  No delusions elicited during this assessment.  She denies suicidal ideations.  She denies homicidal ideations.  Patient is requesting discharge home.  States that she will return to the [facility]. Contacted the patient's legal guardian Barbarann Ehlers [phone]) who reports that the patient is to be discharged to the [facility].  States that she will follow-up with the patient at the [facility] later today.  States that she will also notify the patient's ACT team.  During this admission, the patient was restarted on Zyprexa 10 mg twice daily for mood stability and schizoaffective disorder.  Haloperidol was restarted at  5 mg BID and titrated up to 10 mg twice daily.  Depakote DR was restarted at 500 mg twice daily and titrated up to 1250 mg twice daily.  Cogentin 0.5 mg twice daily was added for EPS prophylaxis.  Patient responded well to this medication combination and continuously improved over her admission.  She no longer required as needed medications for agitation or aggressive behavior.  Patient appears to be at her baseline.  Attempted to Contact [facility] to notify of discharge. Left a message with their answering service to return call.   Patient no longer meets criteria for [facility] involuntary commitment.  She is not an imminent risk to herself or others.  IVC has been rescinded.  Initial BH Assessment by Vesta Mixer, NP 01/18/22: Per my note from this morning around 1130 "Patient was pacing in the hallways, unable to be verbally redirected back to her room. I attempted to speak with her while she was walking down the hallways. She was mumbling to herself, looking around the walls and at the ceiling, she had a frightened look on her face. I attempted to talk to her multi:ple times but she continued to [facility] and would not respond to me and continued to walk away from her room. She received Geodon 20 mg IM at 0558 today and that showed little relief to her symptoms according to RN staff. Will order 10 mg Haldol and 2 mg Ativan IM once. ED RN staff notified. After Chart review it appears patient was previously prescribed Clozaril, Depakote, and Zyprexa and has been off her medications for at least 1 month. Will  go ahead and recommend IP treatment and notify [name]. Will attempt to reassess her again today and contact collateral if possible." I attempted to see this patient again around 1600.  She was asleep in her room however ED staff reports she has been intermittently sleeping and continuing to wake up discharge focused, uncooperative, and mostly incoherent.  I was able to wake the patient  up and she was able to minimally engage in conversation with me.  She often fell asleep during our assessment and I would have to wake her up and repeat my questions.  Ultimately she tells me she is experiencing auditory hallucinations but will not elaborate on what she is hearing.  She denies suicidal or homicidal ideations.  I attempted to ask her about her legal guardian, ACT team, and how long she has been off her medications but she only mumbled and was not able to give me direct answers.  It was reported patient has not been sleeping well so I decided to terminate assessment that way she could continue to sleep. I did get in touch with her legal guardian through [name], [name], at [phone].  She tells me she took over guardianship in 2017.  She does have a long history of schizophrenia and has had multiple inpatient psychiatric admissions.  Patient recently was switched to [name]. [name] tells me they find the patient once a week and give her a weeks worth of medications so she is unsure if she is actually taking them throughout the week or not. [name] describes her baseline is pleasant, clear, able to engage in conversation, and more reasonable and logical.  She she tells me she recently went to [facility] in early August and she feels like she was discharged way too early.  She tells me Ylianna was calling her making delusional and paranoid statements such as requesting [name] to use her magical powers to unlock the doors and release her from the hospital during her stay at [facility].  [facility] [place] [facility] released her after 5 days of inpatient treatment. [name] tells me patient does have a history of medication noncompliance and was previously stabilized on Depakote, Zyprexa, and Clozaril.  However she tells me the ACT team was in the works of discontinuing her Clozaril and weaning her off of it due to her noncompliance with blood work and the suspected noncompliance with medication.  She  is unsure if patient is using any illicit substances.  She tells me patient is homeless, and she refuses to go to any shelters or any living situations they propose.  Example they recently had gotten her a low income housing apartment in [place] [facility] but patient refused to go to the apartment and continued to be homeless.  [name] mentions she had a stay at [facility] hospital for a few months and when she was released was the clearest she had seen her since taking over custody.  He is hoping she can return [facility].  She tells me Shammara has no family that she is aware of.  She is agreeable with me restarting Zyprexa and Depakote at this time. Patient has had significant decompensation due to medication noncompliance. Patient continues to meet criteria for IVC and inpatient psychiatric treatment.   Past Psychiatric History: History significant for schizophrenia versus schizoaffective, homelessness, and noncompliance with medications  Past Medical History:  Past Medical History:  Diagnosis Date   Abnormal Pap smear    ASC-cannot exclude HGSIL on Pap 02/15/2012  ASC-US on 02/03/2012 pap (associated Trichomonas infection). No reflex HPV testing performed on specimen.  Patient informed that she will need repeat Pap in one year.       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 Henrene Dodge (Psych) At [facility] in  [place], [place]. 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   Qualifier: Diagnosis of  By: McDiarmid MD, Jolyn Nap USER 02/08/2009   Qualifier: Diagnosis of  By: Samara Snide      Past Surgical History:  Procedure Laterality Date   INCISION AND DRAINAGE     pilanodal cyst   TIBIA IM NAIL INSERTION Right 10/26/2021   Procedure: INTRAMEDULLARY (IM) NAIL TIBIAL;  Surgeon: Altamese Shiloh, MD;  Location: [place];  Service: Orthopedics;  Laterality: Right;   TOOTH EXTRACTION N/A 10/07/2017   Procedure: EXTRACTION TEETH NUMBERS ONE, SEVENTEEN, NINETEEN AND THIRTY TWO;  Surgeon: Diona Browner, DDS;  Location: [place];  Service: Oral Surgery;  Laterality: N/A;   Family History:  Family History  Adopted: Yes  Problem Relation Age of Onset   Bipolar disorder Sister    Alcohol abuse Brother    Cancer Father    Diabetes Mother     Social History:  Social History   Substance and Sexual Activity  Alcohol Use No   Comment: occ     Social History   Substance and Sexual Activity  Drug Use No    Social History   Socioeconomic History   Marital status: Single    Spouse name: Not on file   Number of children: Not on file   Years of education: Not on file   Highest education level: Not on file  Occupational History   Not on file  Tobacco Use   Smoking status: Every Day    Packs/day: 2.00    Years: 10.00    Total pack years: 20.00    Types: Cigarettes   Smokeless tobacco: Never  Vaping Use   Vaping Use: Never used  Substance and Sexual Activity   Alcohol use: No    Comment: occ   Drug use: No   Sexual activity: Yes    Birth control/protection: Injection  Other Topics Concern   Not on file  Social History Narrative   Adopted   Has Guardian   Living in [place] home with [name]   Transportation: Bus   Social Determinants of Health   Financial Resource Strain: Not on file  Food Insecurity: Not on file  Transportation Needs: Not on file  Physical Activity: Not on file  Stress: Not on file   Social Connections: Not on file    Tobacco Cessation:  A prescription for an FDA-approved tobacco cessation medication provided at discharge  Current Medications: Current Facility-Administered Medications  Medication Dose Route Frequency Provider Last Rate Last Admin   benztropine (COGENTIN)  tablet 0.5 mg  0.5 mg Oral BID Lindon Romp A, NP   0.5 mg at 01/29/22 [id number]   diclofenac Sodium (VOLTAREN) 1 % topical gel 4 g  4 g Topical TID PRN Fransico Meadow, MD   4 g at 01/28/22 1342   divalproex (DEPAKOTE) DR tablet 1,250 mg  1,250 mg Oral BID Lindon Romp A, NP   1,250 mg at 01/29/22 [id number]   haloperidol (HALDOL) tablet 10 mg  10 mg Oral BID Lindon Romp A, NP   10 mg at 01/29/22 0909   haloperidol (HALDOL) tablet 5 mg  5 mg Oral Q6H PRN Lindon Romp A, NP   5 mg at 01/26/22 2105   Or   haloperidol lactate (HALDOL) injection 5 mg  5 mg Intramuscular Q6H PRN Lindon Romp A, NP   5 mg at 01/27/22 1323   hydrocerin (EUCERIN) cream   Topical Daily PRN Deno Etienne, DO       lip balm (CARMEX) ointment   Topical PRN Dorie Rank, MD   1 Application at [id number]   lisinopril (ZESTRIL) tablet 5 mg  5 mg Oral Daily Hayden Rasmussen, MD   5 mg at 01/29/22 [id number]   LORazepam (ATIVAN) tablet 1 mg  1 mg Oral TID PRN Delora Fuel, MD   1 mg at 01/28/22 2244   melatonin tablet 3 mg  3 mg Oral QHS Mesner, Eder Macek, MD   3 mg at 01/28/22 2244   nicotine (NICODERM CQ - dosed in mg/24 hours) patch 21 mg  21 mg Transdermal Daily Molpus, John, MD   21 mg at 01/29/22 0900   OLANZapine zydis (ZYPREXA) disintegrating tablet 10 mg  10 mg Oral BID Vesta Mixer, NP   10 mg at 01/29/22 0912   OLANZapine zydis (ZYPREXA) disintegrating tablet 10 mg  10 mg Oral Q12H PRN Lindon Romp A, NP   10 mg at 01/27/22 0137   polyethylene glycol (MIRALAX / GLYCOLAX) packet 17 g  17 g Oral Daily Lindon Romp A, NP   17 g at 01/29/22 [id number]   Current Outpatient Medications  Medication Sig Dispense Refill   cloZAPine (CLOZARIL) 100 MG  tablet Take 100 mg by mouth at bedtime. Taking with 50 mg tablet = 150 mg at bedtime     clozapine (CLOZARIL) 50 MG tablet Take 50 mg by mouth at bedtime. Taking with the 100 mg = 150 mg at bedtime     divalproex (DEPAKOTE ER) 500 MG 24 hr tablet Take 1,000 mg by mouth 2 (two) times daily.     OLANZapine (ZYPREXA) 20 MG tablet Take 20 mg by mouth at bedtime.     ascorbic acid (VITAMIN C) 500 MG tablet Take 1 tablet (500 mg total) by mouth daily. (Patient not taking: Reported on 01/18/2022) 30 tablet 0   clonazePAM (KLONOPIN) 0.5 MG tablet Take 1 tablet (0.5 mg total) by mouth 2 (two) times daily. 30 tablet 0   cloZAPine (CLOZARIL) 25 MG tablet Take 3 tablets (75 mg total) by mouth at bedtime. (Patient not taking: Reported on 01/18/2022) 30 tablet 0   clozapine (CLOZARIL) 50 MG tablet Take 1 tablet (50 mg total) by mouth daily. (Patient not taking: Reported on 01/18/2022) 30 tablet 0   diphenhydrAMINE (BENADRYL) 25 MG tablet Take 2 tablets (50 mg total) by mouth once as needed for up to 1 dose (Agitation refractory to Zyprexa). (Patient not taking: Reported on 01/18/2022) 60 tablet 0   divalproex (DEPAKOTE) 250 MG DR tablet Take 5  tablets (1,250 mg total) by mouth every 12 (twelve) hours. (Patient not taking: Reported on 01/18/2022) 30 tablet 0   docusate sodium (COLACE) 100 MG capsule Take 1 capsule (100 mg total) by mouth daily. (Patient not taking: Reported on 01/18/2022) 10 capsule 0   naloxegol oxalate (MOVANTIK) 12.5 MG TABS tablet Take 1 tablet  by mouth 2 (two) times daily with a meal. (Patient not taking: Reported on 01/18/2022) 30 tablet 0   nicotine polacrilex (NICORETTE) 2 MG gum Take 1 each (2 mg total) by mouth as needed for smoking cessation. (Patient not taking: Reported on 01/18/2022) 100 tablet 0   OLANZapine (ZYPREXA) 15 MG tablet Take 1 tablet (15 mg total) by mouth at bedtime. (Patient not taking: Reported on 01/18/2022) 30 tablet 0   OLANZapine (ZYPREXA) 5 MG tablet Take 1 tablet (5 mg  total) by mouth daily. (Patient not taking: Reported on 01/18/2022) 30 tablet 0   OLANZapine (ZYPREXA) 10 MG tablet Take 1 tablet (10 mg total) by mouth 2 (two) times daily as needed (agitation and psychosis). (Patient not taking: Reported on 01/18/2022) 30 tablet 0   ondansetron (ZOFRAN-ODT) 4 MG disintegrating tablet Take 1 tablet (4 mg total) by mouth 2 (two) times daily as needed for nausea or vomiting. (Patient not taking: Reported on 01/18/2022) 20 tablet 0   paliperidone (INVEGA SUSTENNA) 156 MG/ML SUSY injection Inject 156 mg into the muscle once.     pantoprazole (PROTONIX) 20 MG tablet Take 1 tablet (20 mg total) by mouth daily. (Patient not taking: Reported on 01/18/2022) 30 tablet 0   polyethylene glycol powder (GLYCOLAX/MIRALAX) 17 GM/SCOOP powder Take 17 g by mouth daily as needed for moderate constipation or severe constipation. (Patient not taking: Reported on 01/18/2022) 238 g 0   polyethylene glycol (MIRALAX / GLYCOLAX) 17 g packet Take 17 g by mouth daily. (Patient not taking: Reported on 01/18/2022) 14 each 0   PTA Medications: (Not in a hospital admission)   [place] Scale:  [facility] [place] [facility] from 01/18/2022 in [place] DEPT ED to Hosp-Admission (Discharged) from 10/25/2021 in [place] ED from 10/23/2021 in [place] No Risk No Risk No Risk       Musculoskeletal: Strength & Muscle Tone: within normal limits Gait & Station: normal Patient leans: N/A  Psychiatric Specialty Exam: Presentation  General Appearance:  Casual  Eye Contact: Good  Speech: Clear and Coherent; Normal Rate  Speech Volume: Normal  Handedness: Right   Mood and Affect  Mood: Euthymic  Affect: Congruent   Thought Process  Thought Processes: Coherent; Linear  Descriptions of Associations:Loose  Orientation:Full (Time, Place and Person)  Thought  Content:Logical  History of Schizophrenia/Schizoaffective disorder:Yes  Duration of Psychotic Symptoms:Greater than six months  Hallucinations:Hallucinations: None Description of Auditory Hallucinations: "Hearing voices saying nothing." Description of Visual Hallucinations: "I am seeing specks of light>"  Ideas of Reference:None  Suicidal Thoughts:Suicidal Thoughts: No  Homicidal Thoughts:Homicidal Thoughts: No   Sensorium  Memory: Recent Fair; Remote Fair; Immediate Good  Judgment: Fair  Insight: Fair   Materials engineer: Fair  Attention Span: Fair  Recall: [place] of Knowledge: Fair  Language: Good   Psychomotor Activity  Psychomotor Activity: Psychomotor Activity: Normal   Assets  Assets: Physical Health; Social Support; Catering manager   Sleep  Sleep: Sleep: Good Number of Hours of Sleep: 7    Physical Exam: Physical Exam Vitals and nursing note  reviewed.  Constitutional:      General: She is not in acute distress.    Appearance: She is not ill-appearing, toxic-appearing or diaphoretic.  Eyes:     General:        Right eye: No discharge.        Left eye: No discharge.  Cardiovascular:     Rate and Rhythm: Normal rate.  Pulmonary:     Effort: Pulmonary effort is normal. No respiratory distress.  Musculoskeletal:        General: Normal range of motion.     Cervical back: Normal range of motion.  Neurological:     Mental Status: She is alert and oriented to person, place, and time.    Review of Systems  Respiratory:  Negative for cough and shortness of breath.   Cardiovascular:  Negative for chest pain.  Gastrointestinal:  Negative for diarrhea, nausea and vomiting.  Psychiatric/Behavioral:  Negative for hallucinations and suicidal ideas. The patient is not nervous/anxious.    Blood pressure (!) 143/92, pulse 93, temperature 97.7 F (36.5 C), resp. rate 18, SpO2 100 %, unknown if currently  breastfeeding. There is no height or weight on file to calculate BMI.   Demographic Factors:  Low socioeconomic status  Loss Factors: Financial problems/change in socioeconomic status  Historical Factors: Family history of mental illness or substance abuse and Impulsivity  Risk Reduction Factors:   Religious beliefs about death and Living with another person, especially a relative  Continued Clinical Symptoms:  Schizophrenia: Chronic Previous psychiatric diagnoses  Cognitive Features That Contribute To Risk:  None    Suicide Risk:  Minimal: No identifiable suicidal ideation.  Patients presenting with no risk factors but with morbid ruminations; may be classified as minimal risk based on the severity of the depressive symptoms    Medical Decision Making: LABREA WARSAW is a 36 y.o. female patient brought in by EMS with history of schizophrenia, noncompliant with medications for unknown amount of time, unable to engage in coherent conversation, not sleeping well, and unknown current substance use.  She is currently homeless and has a legal guardian through [place]. She is closely followed by an act team.  At time of discharge, patient denies SI, HI, AVH and is able to contract for safety. She demonstrated no overt evidence of psychosis or mania during today's assessment.Prior to discharge, Tiffany verbalized that she understood the warning signs, triggers, and symptoms of worsening mental health and how to access emergency mental health care if they felt it was needed. Patient was instructed to call 911 or return to the emergency room if they experienced any concerning symptoms after discharge. Patient voiced understanding and agreed to this.  Every intervention has risks and benefits, including hospitalization. When there are no symptoms or risk factors modifiable by an inpatient admission, the potential negative effects of hospitalization (e.g. reinforcing dependency, removing autonomy,  behavior contagion, causing trauma by creating a sense of being trapped) outweigh the benefits. While continual observation may reduce risk of harm in the moment, it does not decrease the likelihood of harm in the long term. Acute stabilization is essential to patient safety, but must be carried out in a way will not violate autonomy, justice, and non-maleficence. At the time of discharged it was determined that this patient had maximized the possible benefits of psychiatric hospitalization.   Prescriptions printed and placed in  a packet along with AVS. Patient's guardian Barbarann Ehlers [phone]) reports that she or the ACT team will pick up the  packet later today. LG is aware that the patient is being discharged and states that the patient should go to the [facility].   Attempted to Contact [facility] to notify of discharge. Left a message with their answering service to return call.    Disposition: No evidence of imminent risk to self or others at present.   Patient does not meet criteria for psychiatric inpatient admission. Supportive therapy provided about ongoing stressors. Discussed crisis plan, support from social network, calling 911, coming to the Emergency Department, and calling Suicide Hotline.   Rozetta Nunnery, NP 01/29/2022, 9:46 AM

## 2022-01-29 NOTE — ED Notes (Signed)
Pt refuse vital signs .

## 2022-01-29 NOTE — Discharge Instructions (Addendum)
Please visit the [facility] to sign up for case management services. Phone: [phone] Address: [address], [place]   Discharge recommendations:  Patient is to take medications as prescribed. Please see information for follow-up appointment with psychiatry and therapy. Please follow up with your primary care provider for all medical related needs.   Therapy: We recommend that patient participate in individual therapy to address mental health concerns.  Medications: The parent/guardian is to contact a medical professional and/or outpatient provider to address any new side effects that develop. Parent/guardian should update outpatient providers of any new medications and/or medication changes.   Atypical antipsychotics: If you are prescribed an atypical antipsychotic, it is recommended that your height, weight, BMI, blood pressure, fasting lipid panel, and fasting blood sugar be monitored by your outpatient providers.  Safety:  The patient should abstain from use of illicit substances/drugs and abuse of any medications. If symptoms worsen or do not continue to improve or if the patient becomes actively suicidal or homicidal then it is recommended that the patient return to the closest hospital emergency department, the [facility], or call 911 for further evaluation and treatment. National Suicide Prevention Lifeline 1-800-SUICIDE or (708)525-0727.  About 988 988 offers 24/7 access to trained crisis counselors who can help people experiencing mental health-related distress. People can call or text 988 or chat 988lifeline.org for themselves or if they are worried about a loved one who may need crisis support.  Crisis Mobile: [facility]:                     7827031082 (for crisis response 24 hours a day) [place]:                                            ([phone]

## 2022-01-29 NOTE — ED Provider Notes (Signed)
Emergency Medicine Observation Re-evaluation Note  Molly [name] is a 36 y.o. female, seen on rounds today.  Pt initially presented to the ED for complaints of Insomnia Currently, the patient is schizophrenic and awaiting for placement at behavioral health.  Physical Exam  BP 130/84 (BP Location: Right Arm)   Pulse (!) 103   Temp (!) 97.5 F (36.4 C) (Oral)   Resp 20   SpO2 100%  Physical Exam Patient resting in no acute distress  ED Course / MDM  EKG:EKG Interpretation  Date/Time:  Wednesday January 20 2022 20:28:02 EDT Ventricular Rate:  97 PR Interval:  150 QRS Duration: 72 QT Interval:  358 QTC Calculation: 454 R Axis:   53 Text Interpretation: Normal sinus rhythm Normal ECG When compared with ECG of 02-Dec-2021 15:34, PREVIOUS ECG IS PRESENT Confirmed by Davonna Belling [phone]) on 01/21/2022 5:41:23 PM  I have reviewed the labs performed to date as well as medications administered while in observation.  Recent changes in the last 24 hours include none.  Plan  Current plan is for admit to behavioral health.    Milton Ferguson, MD 01/29/22 [phone]

## 2022-01-29 NOTE — ED Notes (Signed)
Patient discharged to home per provider. Patient alert, no s/s of distress. Discharge information given to ACT and guardian. Patient ambulatory off unit, escorted by security. Patient given bus pass for transport.

## 2022-02-19 ENCOUNTER — Emergency Department (HOSPITAL_COMMUNITY)
Admission: EM | Admit: 2022-02-19 | Discharge: 2022-02-20 | Discharge status: Against Medical Advice | Payer: 59 | Attending: Emergency Medicine | Admitting: Emergency Medicine

## 2022-02-19 DIAGNOSIS — Z5321 Procedure and treatment not carried out due to patient leaving prior to being seen by health care provider: Secondary | ICD-10-CM | POA: Insufficient documentation

## 2022-02-19 DIAGNOSIS — M25562 Pain in left knee: Secondary | ICD-10-CM | POA: Diagnosis present

## 2022-02-19 DIAGNOSIS — M25561 Pain in right knee: Secondary | ICD-10-CM | POA: Diagnosis not present

## 2022-02-19 NOTE — ED Triage Notes (Signed)
Pt BIB [facility] from [facility] d/t bilateral knee pain. Pt is. Ambulatory.no traumas to the knee.  Pt is also requesting a monthly pregnancy test. A&O X4.   BP 158/90 HR 90  99% RA  CBG 109

## 2022-02-20 NOTE — ED Notes (Signed)
Patient brought into triage to finish evaluation and discharge. Patient decided that she did not want to wait any longer. Ambulatory to front door

## 2022-02-22 ENCOUNTER — Other Ambulatory Visit: Payer: Self-pay

## 2022-02-22 ENCOUNTER — Encounter (HOSPITAL_COMMUNITY): Payer: Self-pay | Admitting: Emergency Medicine

## 2022-02-22 ENCOUNTER — Emergency Department (HOSPITAL_COMMUNITY)
Admission: EM | Admit: 2022-02-22 | Discharge: 2022-02-23 | Discharge status: Against Medical Advice | Payer: 59 | Attending: Emergency Medicine | Admitting: Emergency Medicine

## 2022-02-22 DIAGNOSIS — Z5321 Procedure and treatment not carried out due to patient leaving prior to being seen by health care provider: Secondary | ICD-10-CM | POA: Diagnosis not present

## 2022-02-22 DIAGNOSIS — M25569 Pain in unspecified knee: Secondary | ICD-10-CM | POA: Insufficient documentation

## 2022-02-22 DIAGNOSIS — F419 Anxiety disorder, unspecified: Secondary | ICD-10-CM | POA: Insufficient documentation

## 2022-02-22 MED ORDER — OLANZAPINE 10 MG PO TABS
10.0000 mg | ORAL_TABLET | ORAL | Status: AC
  Administered 2022-02-22: 10 mg via ORAL
  Filled 2022-02-22: qty 1

## 2022-02-22 MED ORDER — HALOPERIDOL 5 MG PO TABS
10.0000 mg | ORAL_TABLET | Freq: Once | ORAL | Status: AC
  Administered 2022-02-22: 10 mg via ORAL
  Filled 2022-02-22: qty 2

## 2022-02-22 NOTE — ED Provider Triage Note (Signed)
Emergency Medicine Provider Triage Evaluation Note  Molly [name] , a 36 y.o. female  was evaluated in triage.  Pt complains of anxiety and states she needs her meds. Last medications taken 2 days ago.  Review of Systems  Positive: Anxiety, knee pain Negative: Fever   Physical Exam  BP (!) 140/100 (BP Location: Left Arm)   Pulse (!) 117   Temp 99.1 F (37.3 C)   Resp 19   SpO2 99%  Gen:   Awake, no distress   Resp:  Normal effort  MSK:   Moves extremities without difficulty  Other:  Bizarre   Medical Decision Making  Medically screening exam initiated at 9:55 PM.  Appropriate orders placed.  HETAL PROANO was informed that the remainder of the evaluation will be completed by another provider, this initial triage assessment does not replace that evaluation, and the importance of remaining in the ED until their evaluation is complete.  Haldol and zyprexa home meds ordered   Tedd Sias, [place] 02/22/22 2156

## 2022-02-22 NOTE — ED Triage Notes (Signed)
Pt c/o bilateral knee pain and requesting a refill on all her medications. Ambulatory without difficulty.

## 2022-02-22 NOTE — ED Notes (Signed)
Pt to the desk, taking masks and looking for pens, pt directed back to her room, pt then rummaging through the drawers, taking out arm bands and wipes. Security present.

## 2022-02-23 NOTE — ED Notes (Signed)
Patient seen taking bleach wipes into the bathroom. [employer] security notified and cone security notified. Patient in bathroom with wipes. Gpd and security outside bathroom stall trying to talk to patient. Patient starts screaming. Patient instructed to sit down once done with bathroom and not ingest wipes nor use them on skin.

## 2022-02-23 NOTE — ED Notes (Signed)
Patient seen leaving ED 

## 2022-02-23 NOTE — ED Notes (Signed)
Patient requesting to speak to a nurse about sti check and pregnancy test. Triage RN notified

## 2022-03-06 ENCOUNTER — Encounter (HOSPITAL_COMMUNITY): Payer: Self-pay | Admitting: *Deleted

## 2022-03-06 ENCOUNTER — Emergency Department (HOSPITAL_COMMUNITY)
Admission: EM | Admit: 2022-03-06 | Discharge: 2022-03-07 | Disposition: A | Discharge status: Home / Self Care | Payer: 59 | Attending: Emergency Medicine | Admitting: Emergency Medicine

## 2022-03-06 ENCOUNTER — Emergency Department (HOSPITAL_COMMUNITY): Payer: 59

## 2022-03-06 ENCOUNTER — Other Ambulatory Visit: Payer: Self-pay

## 2022-03-06 DIAGNOSIS — E119 Type 2 diabetes mellitus without complications: Secondary | ICD-10-CM | POA: Insufficient documentation

## 2022-03-06 DIAGNOSIS — Z1152 Encounter for screening for COVID-19: Secondary | ICD-10-CM | POA: Diagnosis not present

## 2022-03-06 DIAGNOSIS — R059 Cough, unspecified: Secondary | ICD-10-CM | POA: Diagnosis present

## 2022-03-06 DIAGNOSIS — N898 Other specified noninflammatory disorders of vagina: Secondary | ICD-10-CM | POA: Insufficient documentation

## 2022-03-06 DIAGNOSIS — J189 Pneumonia, unspecified organism: Secondary | ICD-10-CM

## 2022-03-06 LAB — CBC WITH DIFFERENTIAL/PLATELET
Abs Immature Granulocytes: 0.16 10*3/uL — ABNORMAL HIGH (ref 0.00–0.07)
Basophils Absolute: 0.1 10*3/uL (ref 0.0–0.1)
Basophils Relative: 0 %
Eosinophils Absolute: 0.2 10*3/uL (ref 0.0–0.5)
Eosinophils Relative: 1 %
HCT: 39.2 % (ref 36.0–46.0)
Hemoglobin: 12.7 g/dL (ref 12.0–15.0)
Immature Granulocytes: 1 %
Lymphocytes Relative: 28 %
Lymphs Abs: 3.9 10*3/uL (ref 0.7–4.0)
MCH: 27.4 pg (ref 26.0–34.0)
MCHC: 32.4 g/dL (ref 30.0–36.0)
MCV: 84.7 fL (ref 80.0–100.0)
Monocytes Absolute: 1.1 10*3/uL — ABNORMAL HIGH (ref 0.1–1.0)
Monocytes Relative: 7 %
Neutro Abs: 8.7 10*3/uL — ABNORMAL HIGH (ref 1.7–7.7)
Neutrophils Relative %: 63 %
Platelets: 396 10*3/uL (ref 150–400)
RBC: 4.63 MIL/uL (ref 3.87–5.11)
RDW: 17.5 % — ABNORMAL HIGH (ref 11.5–15.5)
WBC: 14.1 10*3/uL — ABNORMAL HIGH (ref 4.0–10.5)
nRBC: 0 % (ref 0.0–0.2)

## 2022-03-06 LAB — COMPREHENSIVE METABOLIC PANEL
ALT: 8 U/L (ref 0–44)
AST: 18 U/L (ref 15–41)
Albumin: 3.6 g/dL (ref 3.5–5.0)
Alkaline Phosphatase: 108 U/L (ref 38–126)
Anion gap: 12 (ref 5–15)
BUN: 11 mg/dL (ref 6–20)
CO2: 24 mmol/L (ref 22–32)
Calcium: 9.5 mg/dL (ref 8.9–10.3)
Chloride: 104 mmol/L (ref 98–111)
Creatinine, Ser: 1.13 mg/dL — ABNORMAL HIGH (ref 0.44–1.00)
GFR, Estimated: >60 mL/min (ref >60)
Glucose, Bld: 111 mg/dL — ABNORMAL HIGH (ref 70–99)
Potassium: 4.4 mmol/L (ref 3.5–5.1)
Sodium: 140 mmol/L (ref 135–145)
Total Bilirubin: 0.4 mg/dL (ref 0.3–1.2)
Total Protein: 7.6 g/dL (ref 6.5–8.1)

## 2022-03-06 LAB — I-STAT BETA HCG BLOOD, ED (MC, WL, AP ONLY): I-stat hCG, quantitative: <5 m[IU]/mL (ref <5)

## 2022-03-06 NOTE — ED Triage Notes (Signed)
[place] ptar she has very poor hygiene  asking her questionsshe did not answer  she is talking to herself she is handing money to pay for any meds  she was told to put her money back in her wallet  not answering all questions asked  the pt is homeless and she tells [place] that

## 2022-03-06 NOTE — ED Notes (Signed)
Pt left a can of chefboyardee, a nutrigrain bar, and another granola bar in triage. Nt attempted to give it back to her, but pt refused and stated, " it's for little bowwow." Nt told the Pt that she does not know who that is. Pt responded "yes you do". Food placed in triage in a patient bag.

## 2022-03-06 NOTE — ED Provider Triage Note (Signed)
Emergency Medicine Provider Triage Evaluation Note  Molly [name] , a 36 y.o. female  was evaluated in triage.  Pt complains of multiple complaints.  States that she has had lower abdominal and pelvic pain.  She states that she has been sexually active and is requesting STI testing as well as HIV and syphilis.  Also reports cold symptoms with shivering chills, cough.  States that she feels cold even when it is warm in the sun.  Transported by EMS.  Review of Systems  Positive: Abdominal pain, pelvic pain, chills Negative:   Physical Exam  BP (!) 113/54 (BP Location: Right Arm)   Pulse (!) 128   Temp 98.4 F (36.9 C) (Oral)   Resp 17   SpO2 99%  Gen:   Awake, no distress   Resp:  Normal effort  MSK:   Moves extremities without difficulty  Other:  Oropharynx erythematous  Medical Decision Making  Medically screening exam initiated at 5:49 PM.  Appropriate orders placed.  Molly [name] was informed that the remainder of the evaluation will be completed by another provider, this initial triage assessment does not replace that evaluation, and the importance of remaining in the ED until their evaluation is complete.     Carlisle Cater, PA-C 03/06/22 1750

## 2022-03-06 NOTE — ED Triage Notes (Signed)
The pt did not answer when she was asked when her last period  she did not answer me

## 2022-03-06 NOTE — ED Notes (Signed)
Sent three golds extra

## 2022-03-06 NOTE — ED Notes (Signed)
Pt refused covid test

## 2022-03-07 DIAGNOSIS — J189 Pneumonia, unspecified organism: Secondary | ICD-10-CM | POA: Diagnosis not present

## 2022-03-07 LAB — HIV ANTIBODY (ROUTINE TESTING W REFLEX): HIV Screen 4th Generation wRfx: NONREACTIVE

## 2022-03-07 LAB — RESP PANEL BY RT-PCR (FLU A&B, COVID) ARPGX2
Influenza A by PCR: NEGATIVE
Influenza B by PCR: NEGATIVE
SARS Coronavirus 2 by RT PCR: NEGATIVE

## 2022-03-07 MED ORDER — DOXYCYCLINE HYCLATE 100 MG PO TABS
100.0000 mg | ORAL_TABLET | Freq: Once | ORAL | Status: AC
  Administered 2022-03-07: 100 mg via ORAL
  Filled 2022-03-07: qty 1

## 2022-03-07 MED ORDER — DOXYCYCLINE HYCLATE 100 MG PO CAPS: 100.0000 mg | ORAL_CAPSULE | Freq: Two times a day (BID) | ORAL | 0 refills | Status: DC

## 2022-03-07 MED ORDER — ALBUTEROL SULFATE HFA 108 (90 BASE) MCG/ACT IN AERS
2.0000 | INHALATION_SPRAY | RESPIRATORY_TRACT | Status: DC | PRN
  Administered 2022-03-07: 2 via RESPIRATORY_TRACT
  Filled 2022-03-07: qty 6.7

## 2022-03-07 NOTE — ED Notes (Signed)
Asked pt to provide urine specimen, pt states "I do not have to pee at this time."

## 2022-03-07 NOTE — ED Notes (Signed)
Md at bedside for pelvic exam with this writer at bedside.

## 2022-03-07 NOTE — ED Notes (Signed)
Pt refusing VS at this time.

## 2022-03-07 NOTE — ED Notes (Signed)
Pt provided with meal and beverage.

## 2022-03-07 NOTE — Care Management (Addendum)
Patient presented by Corey Harold, she lists her address as [facility]. Homeless resources and PCP added to AVS. Called contact listed, [facility]. ACT team member will be calling back. [phone] ACT team called back her guardians name is Sallye Lat # [phone]. She received medication form the ACT team every 2-3 days, last known medication was on Thursday or Friday.  [place] and left a confidential message to return my call  58 Ms Sallye Lat called back regarding patient. She has been trying to find her placement, but patient refused last one. She is aware that the patient will be DC. Ms Percell Miller was added on contacts list as LG

## 2022-03-07 NOTE — ED Notes (Addendum)
Pt in hallway arguing with staff. Pt refusing to leave, verbal de escalation took place and pt left.

## 2022-03-07 NOTE — ED Provider Notes (Signed)
[facility] EMERGENCY DEPARTMENT Provider Note   CSN: [id number] Arrival date & time: 03/06/22  1706     History  Chief Complaint  Patient presents with   Abdominal Pain    Molly [name] is a 36 y.o. female.  Patient is a 36 year old female with a history of schizophrenia currently on medications, tobacco use, risky sexual behaviors, diabetes who is presenting today with multiple complaints.  Patient reports that for the last 2 weeks she has had cough, congestion, no energy and feeling feverish.  She reports having an inhaler in the past but does not have one now.  She states she just feels very tired when she tries to walk around.  She is taken some Benadryl and over-the-counter medications but has not been feeling any better.  Patient has pan positive review of systems but although says she has vomiting was noted to be eating and has not vomited in her 17-hour stay in the emergency room.  She denies any recent medication changes.  She also wishes to be checked for STDs that she reports for the last 3 weeks she has had a white discharge.  She is currently sexually active with multiple partners and does not use protection.  Patient was last checked in September for STIs and was negative including syphilis but has not had HIV testing in a while.  The history is provided by the patient.  Abdominal Pain      Home Medications Prior to Admission medications   Medication Sig Start Date End Date Taking? Authorizing Provider  doxycycline (VIBRAMYCIN) 100 MG capsule Take 1 capsule (100 mg total) by mouth 2 (two) times daily. 03/07/22  Yes Gwyneth Sprout, MD  ascorbic acid (VITAMIN C) 500 MG tablet Take 1 tablet (500 mg total) by mouth daily. Patient not taking: Reported on 01/18/2022 12/03/21   Rana Snare, DO  benztropine (COGENTIN) 0.5 MG tablet Take 1 tablet (0.5 mg total) by mouth 2 (two) times daily. 01/29/22   Jackelyn Poling, NP  clonazePAM (KLONOPIN) 0.5 MG tablet  Take 1 tablet (0.5 mg total) by mouth 2 (two) times daily. 12/03/21   Rana Snare, DO  diphenhydrAMINE (BENADRYL) 25 MG tablet Take 2 tablets (50 mg total) by mouth once as needed for up to 1 dose (Agitation refractory to Zyprexa). Patient not taking: Reported on 01/18/2022 12/03/21   Rana Snare, DO  divalproex (DEPAKOTE ER) 500 MG 24 hr tablet Take 2 tablets (1,000 mg total) by mouth 2 (two) times daily. Take along with depakote 250 mg tablet for a total of 1250 mg twice daily 01/29/22   Jackelyn Poling, NP  divalproex (DEPAKOTE) 250 MG DR tablet Take 1 tablet (250 mg total) by mouth 2 (two) times daily. Take along with 2 depakote 500 mg tablets for a total of 1250 mg twice daily 01/29/22   Jackelyn Poling, NP  docusate sodium (COLACE) 100 MG capsule Take 1 capsule (100 mg total) by mouth daily. Patient not taking: Reported on 01/18/2022 12/03/21   Rana Snare, DO  haloperidol (HALDOL) 10 MG tablet Take 1 tablet (10 mg total) by mouth 2 (two) times daily. 01/29/22   Jackelyn Poling, NP  lisinopril (ZESTRIL) 5 MG tablet Take 1 tablet (5 mg total) by mouth daily. 01/30/22   Jackelyn Poling, NP  naloxegol oxalate (MOVANTIK) 12.5 MG TABS tablet Take 1 tablet  by mouth 2 (two) times daily with a meal. Patient not taking: Reported on 01/18/2022 12/03/21   Sherrilee Gilles,  Legrand Como, DO  nicotine (NICODERM CQ - DOSED IN MG/24 HOURS) 21 mg/24hr patch Place 1 patch (21 mg total) onto the skin daily. 01/30/22   Rozetta Nunnery, NP  nicotine polacrilex (NICORETTE) 2 MG gum Take 1 each (2 mg total) by mouth as needed for smoking cessation. Patient not taking: Reported on 01/18/2022 12/03/21   Angelique Blonder, DO  OLANZapine zydis (ZYPREXA) 10 MG disintegrating tablet Take 1 tablet (10 mg total) by mouth 2 (two) times daily. 01/29/22   Rozetta Nunnery, NP  ondansetron (ZOFRAN-ODT) 4 MG disintegrating tablet Take 1 tablet (4 mg total) by mouth 2 (two) times daily as needed for nausea or vomiting. Patient not taking: Reported on 01/18/2022  12/03/21   Angelique Blonder, DO  paliperidone (INVEGA SUSTENNA) 156 MG/ML SUSY injection Inject 156 mg into the muscle once.    [provider]  pantoprazole (PROTONIX) 20 MG tablet Take 1 tablet (20 mg total) by mouth daily. Patient not taking: Reported on 01/18/2022 12/03/21   Angelique Blonder, DO  polyethylene glycol (MIRALAX / GLYCOLAX) 17 g packet Take 17 g by mouth daily. Patient not taking: Reported on 01/18/2022 12/03/21   Angelique Blonder, DO      Allergies    Abilify [aripiprazole]    Review of Systems   Review of Systems  Gastrointestinal:  Positive for abdominal pain.    Physical Exam Updated Vital Signs BP 106/72   Pulse 80   Temp 98.3 F (36.8 C) (Oral)   Resp 18   Ht 5\' 8"  (1.727 m)   Wt 124 kg   SpO2 98%   BMI 41.57 kg/m  Physical Exam Vitals and nursing note reviewed.  Constitutional:      General: She is not in acute distress.    Appearance: She is well-developed.     Comments: Poor hygiene  HENT:     Head: Normocephalic and atraumatic.     Right Ear: Tympanic membrane normal.     Left Ear: Tympanic membrane normal.  Eyes:     Pupils: Pupils are equal, round, and reactive to light.  Cardiovascular:     Rate and Rhythm: Normal rate and regular rhythm.     Heart sounds: Normal heart sounds. No murmur heard.    No friction rub.  Pulmonary:     Effort: Pulmonary effort is normal.     Breath sounds: Examination of the right-lower field reveals decreased breath sounds. Examination of the left-lower field reveals decreased breath sounds. Decreased breath sounds present. No wheezing or rales.  Abdominal:     General: Bowel sounds are normal. There is no distension.     Palpations: Abdomen is soft.     Tenderness: There is no abdominal tenderness. There is no guarding or rebound.  Genitourinary:    Cervix: Normal.     Uterus: Normal.      Adnexa: Right adnexa normal and left adnexa normal.     Comments: Mild thin white discharge Musculoskeletal:         General: No tenderness. Normal range of motion.     Comments: No edema  Skin:    General: Skin is warm and dry.     Findings: No rash.  Neurological:     Mental Status: She is alert and oriented to person, place, and time. Mental status is at baseline.     Cranial Nerves: No cranial nerve deficit.  Psychiatric:        Behavior: Behavior normal.     Comments: Calm and  cooperative     ED Results / Procedures / Treatments   Labs (all labs ordered are listed, but only abnormal results are displayed) Labs Reviewed  CBC WITH DIFFERENTIAL/PLATELET - Abnormal; Notable for the following components:      Result Value   WBC 14.1 (*)    RDW 17.5 (*)    Neutro Abs 8.7 (*)    Monocytes Absolute 1.1 (*)    Abs Immature Granulocytes 0.16 (*)    All other components within normal limits  COMPREHENSIVE METABOLIC PANEL - Abnormal; Notable for the following components:   Glucose, Bld 111 (*)    Creatinine, Ser 1.13 (*)    All other components within normal limits  RESP PANEL BY RT-PCR (FLU A&B, COVID) ARPGX2  WET PREP, GENITAL  URINALYSIS, ROUTINE W REFLEX MICROSCOPIC  HIV ANTIBODY (ROUTINE TESTING W REFLEX)  RPR  I-STAT BETA HCG BLOOD, ED ([facility], [facility], [facility] ONLY)  GC/CHLAMYDIA PROBE AMP ([place]) NOT AT [facility]    EKG None  Radiology DG Chest 1 View  Result Date: 03/06/2022 CLINICAL DATA:  Cough, shortness of breath EXAM: CHEST  1 VIEW COMPARISON:  10/25/2021 FINDINGS: There is poor inspiration. There is crowding of markings in the lower lung fields. There is no pleural effusion or pneumothorax. IMPRESSION: Crowding of markings in the lower lung fields may be due to poor inspiration. Possibility of early interstitial pneumonia is not excluded. Electronically Signed   By: Ernie Avena M.D.   On: 03/06/2022 18:47    Procedures Procedures    Medications Ordered in ED Medications  albuterol (VENTOLIN HFA) 108 (90 Base) MCG/ACT inhaler 2 puff (has no administration in time range)   doxycycline (VIBRA-TABS) tablet 100 mg (has no administration in time range)    ED Course/ Medical Decision Making/ A&P                           Medical Decision Making Amount and/or Complexity of Data Reviewed External Data Reviewed: notes. Labs: ordered. Decision-making details documented in ED Course. Radiology: ordered and independent interpretation performed. Decision-making details documented in ED Course.  Risk Prescription drug management.   Pt with multiple medical problems and comorbidities and presenting today with a complaint that caries a high risk for morbidity and mortality.  Here today with complaint of URI for the last 2 weeks with ongoing cough and reports no energy.  Patient noted to be afebrile here with sats of greater than 95% on room air.  She does have decreased breath sounds in the bases however unclear if that is related to pneumonia versus body habitus.  Concern for bronchitis, viral etiology versus pneumonia given length of patient's symptoms.  Patient is also requesting STI evaluation.  She has no abdominal pain at this time is been noted to eat without difficulty.  She denies any urinary symptoms. I independently interpreted patient's labs today and CBC with a leukocytosis of 14 with a stable hemoglobin, hCG is negative, CMP within normal limits except mild bump in the creatinine of 1.13 from her baseline of less than 1.  Patient reports she has not been drinking quite as much as usual and is most likely the cause of the mild bump in creatinine.  I have independently visualized and interpreted pt's images today.  Chest x-ray with poor inspiratory film.  Possible atelectasis in the base but radiology reports so be an early interstitial pneumonia.  Given patient's symptoms have been present for the last  2 weeks and she reports no energy will treat for possible early pneumonia.  10:50 AM Patient's pelvic exam without concerning findings.  She has been tested multiple  times for STIs and does have high risk but is not having significant findings today.  She was last tested 6 weeks ago and was negative.  We will give doxycycline for possible early pneumonia and that will also cover for chlamydia.  Patient was given an IM dose of Rocephin here.  Encourage safe sex practices.  Patient has a history of mental illness but reports she is taking her medications and at this time denies having any exacerbation of her mental health and does not wish evaluation of this today.         Final Clinical Impression(s) / ED Diagnoses Final diagnoses:  Vaginal discharge  Community acquired pneumonia of right lower lobe of lung    Rx / DC Orders ED Discharge Orders          Ordered    doxycycline (VIBRAMYCIN) 100 MG capsule  2 times daily        03/07/22 1050              Gwyneth Sprout, MD 03/07/22 1051

## 2022-03-07 NOTE — ED Notes (Signed)
Pt provided with soda

## 2022-03-07 NOTE — ED Notes (Signed)
Pt came to desk, pt educated on call bell use. Pt informed that soda will be brought to patient in a few mintutes.

## 2022-03-07 NOTE — Discharge Instructions (Signed)
[facility] Monday - Friday 8am - 3pm          Sat & Sun 8am - 2pm [address] [place], [place] [address]   [phone]     [facility]org [facility] offers among other critical resources: showers, laundry, barbershop, phone bank, mailroom, computer lab, medical clinic, gardens and a bike maintenance area.   AREA SHELTERS  [facility]  (Men & women) [address] [place] [phone]  [facility] (Men/women/families) [address] [place] [phone]   [facility] (Families with children) [address]  [place] [phone]   [facility] (Domestic Violence Shelter) [address]  [place] [phone]   [facility] (Children ages 7-17) [address]  [place] [phone]   [facility]   (Women & children) [address] [place] [phone]   [facility] (Women/substance abuse) [name]   [phone]   [facility] (Men) [address]   [phone]   [facility] (Men) [address]  [place] [phone]  [facility] (Women) [address]  [place] [phone]   [facility] (Single women & women with children) [address]  [place] [phone]  [facility] (Men/women/families) [address]  [place] [phone]    [facility]   (Domestic Violence shelter) [address]  [place] [phone]   [facility] (Men & women) [address]  [place] [phone]  [facility] (Men) [address]  [place] [phone]   [facility] (Men) [address]  [place] [phone]   [facility] (Single women & families) [address]  [place] [phone]  [facility] (Men/women & families) [address]   [place] [phone]    If you are at risk of losing your housing (throughout [name]) call the Housing Hotline at (336)691-9521. You may also contact 2-1-1, a FREE service of the [employer] that provides information about many resources including housing. Dial 211, or visit online at www.nc211.org. [place]:  [facility], contact Steve Adams [phone] (men only)                          [facility] in [place]:  90 day homeless program for women and men;                             contact Rev. Chambers [phone]  [place]:  [facility]:  men/women/children 910-892-5772  [place]:  Shelter in [place], Pastor Kivett [phone]                       [facility] in [place], Santiago Lopez [phone]   [place]:  [facility], 910-572-3749; (women and children)  [place]:  [facility], men/women/children; [phone]                           [facility] in [place], [phone]; substance abuse halfway house for men              [facility]; 4 bedroom house in [place] for homeless women, contact Elaine Owens [phone]               [facility]   in [place], Susan Bellow, [phone] (women and children)               [facility], for abused women and children, 24 hour crisis line, 910-947-3333, Anne Friesen  [facility], halfway house for women, [place], [phone]  [place]:  [facility], [phone]; open Mon-Thurs from Jan14 - March 15 when temp is below 32 degrees                              [facility]; Pastor Jeff Looney, [phone]; cell [phone]; open 24/7  [place]:  [facility] - Rev Taylor - [phone]  [place]:  transitional housing for women and children; Sabrina Hough [phone]  

## 2022-03-08 LAB — RPR: RPR Ser Ql: NONREACTIVE

## 2022-03-11 ENCOUNTER — Ambulatory Visit (HOSPITAL_COMMUNITY)
Admission: EM | Admit: 2022-03-11 | Discharge: 2022-03-11 | Disposition: A | Discharge status: Home / Self Care | Payer: 59 | Attending: Behavioral Health | Admitting: Behavioral Health

## 2022-03-11 DIAGNOSIS — R456 Violent behavior: Secondary | ICD-10-CM | POA: Insufficient documentation

## 2022-03-11 DIAGNOSIS — Z046 Encounter for general psychiatric examination, requested by authority: Secondary | ICD-10-CM

## 2022-03-11 DIAGNOSIS — F200 Paranoid schizophrenia: Secondary | ICD-10-CM | POA: Insufficient documentation

## 2022-03-11 DIAGNOSIS — Z91148 Patient's other noncompliance with medication regimen for other reason: Secondary | ICD-10-CM | POA: Insufficient documentation

## 2022-03-11 NOTE — BH Assessment (Addendum)
Pt presenting to [facility] under IVC petitioned by her guardiand. Pt denies SI, HI, AVH and substance use. Pt is dressed appropritly, her thoughts are linear and appropriate, and she is orineted x5. Pt appears to be at baseline and is very cooperative. Patient does not appear to be responding to internal stimuli and reports that she is complaint with her medications. Pt receives ACTT services from [facility] and she has daily visits from ACTT to assist with medication compliance.

## 2022-03-11 NOTE — Discharge Instructions (Addendum)
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
## 2022-03-24 ENCOUNTER — Ambulatory Visit (HOSPITAL_COMMUNITY)
Admission: EM | Admit: 2022-03-24 | Discharge: 2022-03-24 | Disposition: A | Discharge status: Home / Self Care | Payer: 59 | Attending: Psychiatry | Admitting: Psychiatry

## 2022-03-24 DIAGNOSIS — F209 Schizophrenia, unspecified: Secondary | ICD-10-CM | POA: Insufficient documentation

## 2022-03-24 LAB — COMPREHENSIVE METABOLIC PANEL
ALT: 9 U/L (ref 0–44)
AST: 16 U/L (ref 15–41)
Albumin: 3.4 g/dL — ABNORMAL LOW (ref 3.5–5.0)
Alkaline Phosphatase: 109 U/L (ref 38–126)
Anion gap: 10 (ref 5–15)
BUN: 9 mg/dL (ref 6–20)
CO2: 22 mmol/L (ref 22–32)
Calcium: 8.9 mg/dL (ref 8.9–10.3)
Chloride: 106 mmol/L (ref 98–111)
Creatinine, Ser: 0.71 mg/dL (ref 0.44–1.00)
GFR, Estimated: >60 mL/min (ref >60)
Glucose, Bld: 126 mg/dL — ABNORMAL HIGH (ref 70–99)
Potassium: 3.8 mmol/L (ref 3.5–5.1)
Sodium: 138 mmol/L (ref 135–145)
Total Bilirubin: 0.4 mg/dL (ref 0.3–1.2)
Total Protein: 7.3 g/dL (ref 6.5–8.1)

## 2022-03-24 LAB — HIV ANTIBODY (ROUTINE TESTING W REFLEX): HIV Screen 4th Generation wRfx: NONREACTIVE

## 2022-03-24 LAB — VALPROIC ACID LEVEL: Valproic Acid Lvl: 42 ug/mL — ABNORMAL LOW (ref 50.0–100.0)

## 2022-03-24 NOTE — Discharge Instructions (Addendum)
Follow-up recommendations:  Activity:  Normal, as tolerated Diet:  Per PCP recommendation  Patient is instructed prior to discharge to: Take all medications as prescribed by her mental healthcare provider. Report any adverse effects and/or reactions from the medicines to her outpatient provider promptly. Patient has been instructed & cautioned: To not engage in alcohol and or illegal drug use while on prescription medicines.  In the event of worsening symptoms, patient is instructed to call the crisis hotline at 988, 911 and or go to the nearest ED for appropriate evaluation and treatment of symptoms. To follow-up with her primary care provider for your other medical issues, concerns and or health care needs.  

## 2022-03-24 NOTE — ED Notes (Signed)
Discharge instructions provided and Pt stated understanding. Pt alert, orient and ambulatory prior to d/c from facility. Personal belongings returned from the blue locker. Safe transport called for transportation services. Pt escorted to the sally port. Safety maintained.

## 2022-03-24 NOTE — ED Provider Notes (Signed)
Behavioral Health Urgent Care Medical Screening Exam  Patient Name: Molly [name] MRN: [id number] Date of Evaluation: 03/24/22 Chief Complaint:   Diagnosis:  Final diagnoses:  Schizophrenia, unspecified type (HCC)    History of Present illness: Molly [name] is a 36 y.o. 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  Flowsheet Row ED from 03/24/2022 in [facility] ED from 03/06/2022 in [facility] EMERGENCY DEPARTMENT ED from 02/22/2022 in [facility] EMERGENCY DEPARTMENT  C-SSRS RISK CATEGORY No Risk No Risk No Risk       Psychiatric Specialty Exam  Presentation  General Appearance:Casual  Eye Contact:Fair  Speech:Clear and Coherent; Normal Rate  Speech Volume:Normal  Handedness:Right   Mood and Affect  Mood: Euthymic; Anxious  Affect: Blunt; Appropriate (Blunted affect at times, appropriate overall)   Thought Process  Thought Processes: Coherent; Linear  Descriptions of Associations:Intact  Orientation:Full (Time, Place and Person)  Thought Content:Abstract Reasoning; Paranoid Ideation; Delusions; Logical (Mostly logical but with some delusions and paranoid ideation)  Diagnosis of Schizophrenia or Schizoaffective disorder in past: Yes  Duration of Psychotic Symptoms: Greater than six months  Hallucinations:Auditory; Visual Voices of people yelling and asking for money, drugs, and cars People, unspecified  Ideas of Reference:Delusions  Suicidal Thoughts:No  Homicidal Thoughts:No   Sensorium  Memory: Immediate Fair; Recent Fair  Judgment: Fair  Insight: Fair   Chartered certified accountant: Fair  Attention Span: Fair  Recall: [employer] of Knowledge: Fair  Language: Fair   Psychomotor Activity  Psychomotor Activity: Normal Tardive Dyskinesia No   Assets  Assets: Communication Skills; Desire for Improvement; Housing; Social  Support   Sleep  Sleep: Fair  Number of hours:  7   No data recorded  Physical Exam: Physical Exam ROS Blood pressure (!) 127/97, pulse (!) 107, temperature 98.4 F (36.9 C), temperature source Oral, resp. rate 18, SpO2 97 %, unknown if currently breastfeeding. There is no height or weight on file to calculate BMI.  Musculoskeletal: Strength & Muscle Tone: {desc; muscle tone:32375} Gait & Station: {PE GAIT ED NATL:22525} Patient leans: {Patient Leans:21022755}   [facility] MSE Discharge Disposition for Follow up and Recommendations: {[facility] MSE Recommendations:24277}   Lamar Sprinkles, MD 03/24/2022, 10:57 PM
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
## 2022-03-31 ENCOUNTER — Emergency Department (HOSPITAL_COMMUNITY)
Admission: EM | Admit: 2022-03-31 | Discharge: 2022-03-31 | Disposition: A | Discharge status: Home / Self Care | Payer: 59 | Attending: Emergency Medicine | Admitting: Emergency Medicine

## 2022-03-31 ENCOUNTER — Other Ambulatory Visit: Payer: Self-pay

## 2022-03-31 ENCOUNTER — Emergency Department (HOSPITAL_COMMUNITY): Payer: 59

## 2022-03-31 ENCOUNTER — Encounter (HOSPITAL_COMMUNITY): Payer: Self-pay

## 2022-03-31 DIAGNOSIS — J45909 Unspecified asthma, uncomplicated: Secondary | ICD-10-CM | POA: Diagnosis not present

## 2022-03-31 DIAGNOSIS — M25571 Pain in right ankle and joints of right foot: Secondary | ICD-10-CM

## 2022-03-31 DIAGNOSIS — E119 Type 2 diabetes mellitus without complications: Secondary | ICD-10-CM | POA: Diagnosis not present

## 2022-03-31 DIAGNOSIS — M25561 Pain in right knee: Secondary | ICD-10-CM

## 2022-03-31 DIAGNOSIS — I10 Essential (primary) hypertension: Secondary | ICD-10-CM | POA: Insufficient documentation

## 2022-03-31 MED ORDER — DIPHENHYDRAMINE HCL 25 MG PO CAPS
25.0000 mg | ORAL_CAPSULE | Freq: Once | ORAL | Status: AC
  Administered 2022-03-31: 25 mg via ORAL
  Filled 2022-03-31: qty 1

## 2022-03-31 MED ORDER — ACETAMINOPHEN 500 MG PO TABS
1000.0000 mg | ORAL_TABLET | ORAL | Status: AC
  Administered 2022-03-31: 1000 mg via ORAL
  Filled 2022-03-31: qty 2

## 2022-03-31 NOTE — ED Notes (Signed)
Spoke to pt legal guardian who states pt will make it home on a bus. Bus pass given to pt. Cleared for dc.
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
## 2022-03-31 NOTE — ED Notes (Signed)
Pt is currently at xray and was asked to please bring patient to [address] when they are finished with her xrays.

## 2022-03-31 NOTE — ED Triage Notes (Signed)
Patient is homeless per ems and patient has been having left sided abd pain x 3 days with no nausea vomiting or diarrhea.  Also reports she had a fall and her left ankle ankles but no deformity.  Per ems patient does not speak to women only men.  Hx schizo

## 2022-03-31 NOTE — Discharge Instructions (Signed)
Your xrays do not show any fractures (other than your old fracture). Your hardware is in place from your prior surgery.   Please use Tylenol or ibuprofen for pain.  You may use 600 mg ibuprofen every 6 hours or 1000 mg of Tylenol every 6 hours.  You may choose to alternate between the 2.  This would be most effective.  Not to exceed 4 g of Tylenol within 24 hours.  Not to exceed 3200 mg ibuprofen 24 hours.

## 2022-04-01 ENCOUNTER — Emergency Department (HOSPITAL_COMMUNITY)
Admission: EM | Admit: 2022-04-01 | Discharge: 2022-04-02 | Disposition: A | Discharge status: Home / Self Care | Payer: 59 | Attending: Emergency Medicine | Admitting: Emergency Medicine

## 2022-04-01 ENCOUNTER — Encounter (HOSPITAL_COMMUNITY): Payer: Self-pay

## 2022-04-01 DIAGNOSIS — R000 Tachycardia, unspecified: Secondary | ICD-10-CM | POA: Insufficient documentation

## 2022-04-01 DIAGNOSIS — R058 Other specified cough: Secondary | ICD-10-CM | POA: Diagnosis not present

## 2022-04-01 DIAGNOSIS — Z79899 Other long term (current) drug therapy: Secondary | ICD-10-CM | POA: Insufficient documentation

## 2022-04-01 DIAGNOSIS — E119 Type 2 diabetes mellitus without complications: Secondary | ICD-10-CM | POA: Diagnosis not present

## 2022-04-01 DIAGNOSIS — F209 Schizophrenia, unspecified: Secondary | ICD-10-CM | POA: Diagnosis not present

## 2022-04-01 DIAGNOSIS — Z1152 Encounter for screening for COVID-19: Secondary | ICD-10-CM | POA: Insufficient documentation

## 2022-04-01 DIAGNOSIS — I10 Essential (primary) hypertension: Secondary | ICD-10-CM | POA: Diagnosis not present

## 2022-04-01 DIAGNOSIS — Z202 Contact with and (suspected) exposure to infections with a predominantly sexual mode of transmission: Secondary | ICD-10-CM | POA: Insufficient documentation

## 2022-04-01 DIAGNOSIS — N898 Other specified noninflammatory disorders of vagina: Secondary | ICD-10-CM | POA: Diagnosis present

## 2022-04-01 LAB — I-STAT BETA HCG BLOOD, ED (MC, WL, AP ONLY): I-stat hCG, quantitative: <5 m[IU]/mL (ref <5)

## 2022-04-01 MED ORDER — ONDANSETRON 4 MG PO TBDP
4.0000 mg | ORAL_TABLET | Freq: Once | ORAL | Status: AC
  Administered 2022-04-01: 4 mg via ORAL
  Filled 2022-04-01: qty 1

## 2022-04-01 NOTE — ED Notes (Signed)
When walking pt back to the room from lobby pt stated "where is room 32. I don't need you to walk me back. Goodbye."
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
## 2022-04-02 ENCOUNTER — Emergency Department (HOSPITAL_COMMUNITY): Payer: 59

## 2022-04-02 DIAGNOSIS — F209 Schizophrenia, unspecified: Secondary | ICD-10-CM | POA: Diagnosis not present

## 2022-04-02 LAB — COMPREHENSIVE METABOLIC PANEL
ALT: 11 U/L (ref 0–44)
AST: 14 U/L — ABNORMAL LOW (ref 15–41)
Albumin: 3.4 g/dL — ABNORMAL LOW (ref 3.5–5.0)
Alkaline Phosphatase: 111 U/L (ref 38–126)
Anion gap: 10 (ref 5–15)
BUN: 11 mg/dL (ref 6–20)
CO2: 22 mmol/L (ref 22–32)
Calcium: 9 mg/dL (ref 8.9–10.3)
Chloride: 105 mmol/L (ref 98–111)
Creatinine, Ser: 0.79 mg/dL (ref 0.44–1.00)
GFR, Estimated: >60 mL/min (ref >60)
Glucose, Bld: 109 mg/dL — ABNORMAL HIGH (ref 70–99)
Potassium: 3.8 mmol/L (ref 3.5–5.1)
Sodium: 137 mmol/L (ref 135–145)
Total Bilirubin: 0.3 mg/dL (ref 0.3–1.2)
Total Protein: 7.2 g/dL (ref 6.5–8.1)

## 2022-04-02 LAB — CBC WITH DIFFERENTIAL/PLATELET
Abs Immature Granulocytes: 0.09 10*3/uL — ABNORMAL HIGH (ref 0.00–0.07)
Basophils Absolute: 0.1 10*3/uL (ref 0.0–0.1)
Basophils Relative: 1 %
Eosinophils Absolute: 0.3 10*3/uL (ref 0.0–0.5)
Eosinophils Relative: 2 %
HCT: 37.2 % (ref 36.0–46.0)
Hemoglobin: 12.1 g/dL (ref 12.0–15.0)
Immature Granulocytes: 1 %
Lymphocytes Relative: 31 %
Lymphs Abs: 4.8 10*3/uL — ABNORMAL HIGH (ref 0.7–4.0)
MCH: 28.2 pg (ref 26.0–34.0)
MCHC: 32.5 g/dL (ref 30.0–36.0)
MCV: 86.7 fL (ref 80.0–100.0)
Monocytes Absolute: 1.6 10*3/uL — ABNORMAL HIGH (ref 0.1–1.0)
Monocytes Relative: 11 %
Neutro Abs: 8.5 10*3/uL — ABNORMAL HIGH (ref 1.7–7.7)
Neutrophils Relative %: 54 %
Platelets: 366 10*3/uL (ref 150–400)
RBC: 4.29 MIL/uL (ref 3.87–5.11)
RDW: 17.8 % — ABNORMAL HIGH (ref 11.5–15.5)
WBC: 15.4 10*3/uL — ABNORMAL HIGH (ref 4.0–10.5)
nRBC: 0 % (ref 0.0–0.2)

## 2022-04-02 LAB — SALICYLATE LEVEL: Salicylate Lvl: <7 mg/dL — ABNORMAL LOW (ref 7.0–30.0)

## 2022-04-02 LAB — RESP PANEL BY RT-PCR (FLU A&B, COVID) ARPGX2
Influenza A by PCR: NEGATIVE
Influenza B by PCR: NEGATIVE
SARS Coronavirus 2 by RT PCR: NEGATIVE

## 2022-04-02 LAB — LIPASE, BLOOD: Lipase: 44 U/L (ref 11–51)

## 2022-04-02 LAB — ACETAMINOPHEN LEVEL: Acetaminophen (Tylenol), Serum: <10 ug/mL — ABNORMAL LOW (ref 10–30)

## 2022-04-02 LAB — ETHANOL: Alcohol, Ethyl (B): <10 mg/dL (ref <10)

## 2022-04-02 NOTE — Discharge Instructions (Signed)
Chest results are reassuring.  Please follow-up in the outpatient setting.  Please continue to follow with your ACT team and take your medications as prescribed.
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
## 2022-04-02 NOTE — ED Notes (Signed)
Discharge instructions reviewed with pt. Belongings with pt upon depart. Pt ambulatory to lobby to wait for ride.
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
## 2022-04-05 ENCOUNTER — Encounter (HOSPITAL_COMMUNITY): Payer: Self-pay

## 2022-04-05 ENCOUNTER — Emergency Department (HOSPITAL_COMMUNITY)
Admission: EM | Admit: 2022-04-05 | Discharge: 2022-04-06 | Discharge status: Against Medical Advice | Payer: 59 | Attending: Student | Admitting: Student

## 2022-04-05 DIAGNOSIS — Z760 Encounter for issue of repeat prescription: Secondary | ICD-10-CM | POA: Diagnosis present

## 2022-04-05 DIAGNOSIS — Z5321 Procedure and treatment not carried out due to patient leaving prior to being seen by health care provider: Secondary | ICD-10-CM | POA: Insufficient documentation

## 2022-04-05 NOTE — ED Provider Triage Note (Signed)
Emergency Medicine Provider Triage Evaluation Note  Molly [name] , a 36 y.o. female  was evaluated in triage.  Pt complains of being out of her medication.  She states that she has been out of her medication for 3 days.  She initially cannot tell me what medications she is on but then states its Cogentin, Depakote, Zyprexa, Haldol, paliperidone.  She denies SI/HI.  She states that she does hear screaming.  Review of Systems  Positive: See above Negative:   Physical Exam  BP (!) 135/96   Pulse 90   Temp 98.3 F (36.8 C) (Oral)   Resp 18   Ht 5\' 8"  (1.727 m)   Wt 123.8 kg   SpO2 99%   BMI 41.51 kg/m  Gen:   Awake, no distress   Resp:  Normal effort  MSK:   Moves extremities without difficulty  Other:  Patient stares off during our exam and is slow to respond.  Unclear if she is responding to internal stimuli.  She is somewhat hyperactive and shaking her leg throughout the interview.  Medical Decision Making  Medically screening exam initiated at 9:19 PM.  Appropriate orders placed.  Nuala Chiles was informed that the remainder of the evaluation will be completed by another provider, this initial triage assessment does not replace that evaluation, and the importance of remaining in the ED until their evaluation is complete.     Biagio Borg, PA-C 04/05/22 2124

## 2022-04-05 NOTE — ED Triage Notes (Signed)
Pt reports "I need more medication" Reports has been off her mental health medications x 3 days.
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
## 2022-04-06 ENCOUNTER — Emergency Department (HOSPITAL_COMMUNITY)
Admission: EM | Admit: 2022-04-06 | Discharge: 2022-04-06 | Discharge status: Against Medical Advice | Payer: 59 | Attending: Emergency Medicine | Admitting: Emergency Medicine

## 2022-04-06 ENCOUNTER — Encounter (HOSPITAL_COMMUNITY): Payer: Self-pay

## 2022-04-06 ENCOUNTER — Emergency Department (HOSPITAL_COMMUNITY): Admission: EM | Admit: 2022-04-06 | Discharge: 2022-04-06 | Discharge status: Against Medical Advice | Payer: 59

## 2022-04-06 ENCOUNTER — Other Ambulatory Visit: Payer: Self-pay

## 2022-04-06 DIAGNOSIS — Z5321 Procedure and treatment not carried out due to patient leaving prior to being seen by health care provider: Secondary | ICD-10-CM | POA: Diagnosis not present

## 2022-04-06 DIAGNOSIS — Z760 Encounter for issue of repeat prescription: Secondary | ICD-10-CM | POA: Insufficient documentation
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
## 2022-04-06 NOTE — ED Notes (Signed)
Pt seen exiting the lobby with a pt that has been discharged.

## 2022-04-07 ENCOUNTER — Emergency Department (HOSPITAL_COMMUNITY)
Admission: EM | Admit: 2022-04-07 | Discharge: 2022-04-08 | Disposition: A | Discharge status: Home / Self Care | Payer: 59 | Attending: Emergency Medicine | Admitting: Emergency Medicine

## 2022-04-07 DIAGNOSIS — I10 Essential (primary) hypertension: Secondary | ICD-10-CM | POA: Insufficient documentation

## 2022-04-07 DIAGNOSIS — Z760 Encounter for issue of repeat prescription: Secondary | ICD-10-CM | POA: Insufficient documentation

## 2022-04-07 DIAGNOSIS — F209 Schizophrenia, unspecified: Secondary | ICD-10-CM | POA: Diagnosis present

## 2022-04-07 NOTE — ED Notes (Signed)
Patient refusing blood pressure.  Will not remove large jacket for RN to obtain vitals.  Yelling at staff and becoming aggressive.  Refusing to answer triage questions or why she is coming to ED today.

## 2022-04-08 ENCOUNTER — Encounter (HOSPITAL_COMMUNITY): Payer: Self-pay

## 2022-04-08 ENCOUNTER — Other Ambulatory Visit: Payer: Self-pay

## 2022-04-08 ENCOUNTER — Emergency Department (HOSPITAL_COMMUNITY)
Admission: EM | Admit: 2022-04-08 | Discharge: 2022-04-08 | Discharge status: Against Medical Advice | Payer: 59 | Attending: Emergency Medicine | Admitting: Emergency Medicine

## 2022-04-08 DIAGNOSIS — Z5321 Procedure and treatment not carried out due to patient leaving prior to being seen by health care provider: Secondary | ICD-10-CM | POA: Diagnosis not present

## 2022-04-08 DIAGNOSIS — Z760 Encounter for issue of repeat prescription: Secondary | ICD-10-CM | POA: Insufficient documentation

## 2022-04-08 MED ORDER — OLANZAPINE 10 MG PO TABS: 10.0000 mg | ORAL_TABLET | Freq: Every day | ORAL | 0 refills | Status: DC

## 2022-04-08 MED ORDER — DIVALPROEX SODIUM 500 MG PO DR TAB: 500.0000 mg | DELAYED_RELEASE_TABLET | Freq: Two times a day (BID) | ORAL | 0 refills | Status: DC

## 2022-04-08 NOTE — ED Notes (Signed)
Tech tried to get vitals pt refused

## 2022-04-08 NOTE — Discharge Instructions (Signed)
I have refilled your medications please take as prescribed  Please follow-up with the behavioral health urgent care for further evaluation  Come back to the emergency department if you develop chest pain, shortness of breath, severe abdominal pain, uncontrolled nausea, vomiting, diarrhea.

## 2022-04-08 NOTE — ED Provider Notes (Signed)
[facility]GENC[facility]PT Provider Note   CSN: [id number] Arrival date & time: 04/07/22  2146     History  Chief Complaint  Patient presents with   Medication Refill    Molly [name] is a 36 y.o. female.  HPI   Patient with medical history including schizophrenia, hypertension, presents emerged part complaints of needing medication refill.  Patient states that she is currently out of her medication needs morbid, she has no other complaints, she does not endorse any suicidal Aline August ideations denies any hallucinations or delusions, no endorsing chest pain stomach pains nausea vomiting diarrhea.  She is refusing vital signs at this time.  Reviewed patient's chart has extensive history of psychiatric disorder was most recently seen last month, she was psychiatrically cleared and discharged.  She was seen in October, at that time she was on Depakote as well as Zyprexa,  Home Medications Prior to Admission medications   Medication Sig Start Date End Date Taking? Authorizing Provider  divalproex (DEPAKOTE) 500 MG DR tablet Take 1 tablet (500 mg total) by mouth 2 (two) times daily for 7 days. 04/08/22 04/15/22 Yes Carroll Sage, PA-C  OLANZapine (ZYPREXA) 10 MG tablet Take 1 tablet (10 mg total) by mouth daily for 7 days. 04/08/22 04/15/22 Yes Carroll Sage, PA-C  benztropine (COGENTIN) 1 MG tablet Take 1 mg by mouth 2 (two) times daily. 03/02/22   [provider]  haloperidol (HALDOL) 10 MG tablet Take 1 tablet (10 mg total) by mouth 2 (two) times daily. 01/29/22   Jackelyn Poling, NP  paliperidone (INVEGA SUSTENNA) 156 MG/ML SUSY injection Inject 156 mg into the muscle once.    [provider]      Allergies    Abilify [aripiprazole]    Review of Systems   Review of Systems  Constitutional:  Negative for chills and fever.  Respiratory:  Negative for shortness of breath.   Cardiovascular:  Negative for chest pain.  Gastrointestinal:   Negative for abdominal pain.  Neurological:  Negative for headaches.    Physical Exam Updated Vital Signs There were no vitals taken for this visit. Physical Exam Vitals and nursing note reviewed.  Constitutional:      General: She is not in acute distress.    Appearance: She is not ill-appearing.  HENT:     Head: Normocephalic and atraumatic.     Comments: There is no deformity of the head present no raccoon eyes or Battle sign noted.    Nose: No congestion.  Eyes:     Conjunctiva/sclera: Conjunctivae normal.  Cardiovascular:     Rate and Rhythm: Normal rate and regular rhythm.  Pulmonary:     Effort: Pulmonary effort is normal.  Skin:    General: Skin is warm and dry.  Neurological:     Mental Status: She is alert.     Comments: No facial asymmetry no difficulty with word finding following two-step commands there is no unilateral weakness present.  Gait fully intact.  Psychiatric:        Mood and Affect: Mood normal.     Comments: Patient is alert and oriented x 4, does not endorse any suicidal homicidal ideations, does not appear to be respond to internal stimuli.  No acute psychiatric emergency at this time.     ED Results / Procedures / Treatments   Labs (all labs ordered are listed, but only abnormal results are displayed) Labs Reviewed - No data to display  EKG None  Radiology No  results found.  Procedures Procedures    Medications Ordered in ED Medications - No data to display  ED Course/ Medical Decision Making/ A&P                           Medical Decision Making  This patient presents to the ED for concern of medication refill, this involves an extensive number of treatment options, and is a complaint that carries with it a high risk of complications and morbidity.  The differential diagnosis includes psychiatric emergency, withdrawals,    Additional history obtained:  Additional history obtained from N/A External records from outside source  obtained and reviewed including psychiatric notes   Co morbidities that complicate the patient evaluation  Schizophrenia  Social Determinants of Health:  Homeless    Lab Tests:  I Ordered, and personally interpreted labs.  The pertinent results include: N/A   Imaging Studies ordered:  I ordered imaging studies including N/A I independently visualized and interpreted imaging which showed N/A I agree with the radiologist interpretation   Cardiac Monitoring:  The patient was maintained on a cardiac monitor.  I personally viewed and interpreted the cardiac monitored which showed an underlying rhythm of: N/A   Medicines ordered and prescription drug management:  I ordered medication including N/A I have reviewed the patients home medicines and have made adjustments as needed  Critical Interventions:  N/A   Reevaluation:  With request of medication refill, she had benign physical exam, she is refusing further workup at this time is ready for discharge.   Consultations Obtained:  N/A    Test Considered:  N/A    Rule out Suspicion for psychiatric emergency is low at this time, she denies suicidal homicidal ideations, she does not appear to repeat respond to internal stimuli.  She is alert and oriented.  I doubt withdrawals as she is nontoxic-appearing.  I am unable to obtain vital signs as patient is refusing, during my examination she is nontoxic-appearing, she is ambulate without difficulty.     Dispostion and problem list  After consideration of the diagnostic results and the patients response to treatment, I feel that the patent would benefit from discharge.  1.  Medication refill patient was given a short course of her psychotic medications, and I referred her to behavioral health.  Gave her strict return precautions.            Final Clinical Impression(s) / ED Diagnoses Final diagnoses:  Medication refill    Rx / DC Orders ED  Discharge Orders          Ordered    divalproex (DEPAKOTE) 500 MG DR tablet  2 times daily        04/08/22 0137    OLANZapine (ZYPREXA) 10 MG tablet  Daily        04/08/22 0137              Carroll Sage, PA-C 04/08/22 0139    Gilda Crease, MD 04/08/22 [phone]

## 2022-04-08 NOTE — ED Notes (Addendum)
This RN has reviewed the d/c paperwork with the patient. Pt is ambulatory with independent steady gait. Pt refused discharge vital signs.

## 2022-04-08 NOTE — ED Triage Notes (Signed)
Patient coming to ED for evaluation.  States she wants a medication refill of her medications.  Will not name the medications she needs/wants.  Patient has been seen multiple times for same complaint.

## 2022-04-08 NOTE — ED Notes (Signed)
Pt refused to take jackets off for accurate BP.

## 2022-04-08 NOTE — ED Triage Notes (Signed)
This RN went into room to triage the patient and to obtain vital signs. She has a large jacket on that is too thick for blood pressure cuff. She refused to take it off and began yelling at this RN. I then asked her what brings her to the ER today but she just stared at me and would not answer questions. Another staff member also went into the room to ask her why she is here and she then refused to answer questions. This RN is unsure why this patient is here. Pt is ambulatory with independent steady gait, skin warm and dry and speaking clear complete sentences.

## 2022-04-08 NOTE — ED Notes (Signed)
Patient out to nursing station requesting to "be seen."  Pt informed that she has been seen.  Keeps demanding to "be seen."  Pt encouraged to return to room and that EDP would be made aware that she is demanding to be seen again

## 2022-04-10 ENCOUNTER — Emergency Department (HOSPITAL_COMMUNITY)
Admission: EM | Admit: 2022-04-10 | Discharge: 2022-05-09 | Disposition: A | Discharge status: Other Acute Inpatient Hospital | Payer: 59 | Attending: Emergency Medicine | Admitting: Emergency Medicine

## 2022-04-10 DIAGNOSIS — I10 Essential (primary) hypertension: Secondary | ICD-10-CM | POA: Diagnosis not present

## 2022-04-10 DIAGNOSIS — Z20822 Contact with and (suspected) exposure to covid-19: Secondary | ICD-10-CM | POA: Diagnosis not present

## 2022-04-10 DIAGNOSIS — F250 Schizoaffective disorder, bipolar type: Secondary | ICD-10-CM | POA: Diagnosis not present

## 2022-04-10 DIAGNOSIS — F259 Schizoaffective disorder, unspecified: Secondary | ICD-10-CM | POA: Diagnosis present

## 2022-04-10 DIAGNOSIS — Z5900 Homelessness unspecified: Secondary | ICD-10-CM | POA: Insufficient documentation

## 2022-04-10 DIAGNOSIS — R443 Hallucinations, unspecified: Secondary | ICD-10-CM

## 2022-04-10 DIAGNOSIS — F17210 Nicotine dependence, cigarettes, uncomplicated: Secondary | ICD-10-CM | POA: Insufficient documentation

## 2022-04-10 DIAGNOSIS — E119 Type 2 diabetes mellitus without complications: Secondary | ICD-10-CM | POA: Insufficient documentation

## 2022-04-10 DIAGNOSIS — Z046 Encounter for general psychiatric examination, requested by authority: Secondary | ICD-10-CM

## 2022-04-10 LAB — COMPREHENSIVE METABOLIC PANEL
ALT: 17 U/L (ref 0–44)
AST: 31 U/L (ref 15–41)
Albumin: 3.5 g/dL (ref 3.5–5.0)
Alkaline Phosphatase: 109 U/L (ref 38–126)
Anion gap: 14 (ref 5–15)
BUN: 9 mg/dL (ref 6–20)
CO2: 20 mmol/L — ABNORMAL LOW (ref 22–32)
Calcium: 8.8 mg/dL — ABNORMAL LOW (ref 8.9–10.3)
Chloride: 104 mmol/L (ref 98–111)
Creatinine, Ser: 1.05 mg/dL — ABNORMAL HIGH (ref 0.44–1.00)
GFR, Estimated: >60 mL/min (ref >60)
Glucose, Bld: 157 mg/dL — ABNORMAL HIGH (ref 70–99)
Potassium: 3.3 mmol/L — ABNORMAL LOW (ref 3.5–5.1)
Sodium: 138 mmol/L (ref 135–145)
Total Bilirubin: 0.4 mg/dL (ref 0.3–1.2)
Total Protein: 7.1 g/dL (ref 6.5–8.1)

## 2022-04-10 LAB — CBC
HCT: 36.4 % (ref 36.0–46.0)
Hemoglobin: 12.5 g/dL (ref 12.0–15.0)
MCH: 29.1 pg (ref 26.0–34.0)
MCHC: 34.3 g/dL (ref 30.0–36.0)
MCV: 84.8 fL (ref 80.0–100.0)
Platelets: 389 10*3/uL (ref 150–400)
RBC: 4.29 MIL/uL (ref 3.87–5.11)
RDW: 17.7 % — ABNORMAL HIGH (ref 11.5–15.5)
WBC: 16 10*3/uL — ABNORMAL HIGH (ref 4.0–10.5)
nRBC: 0 % (ref 0.0–0.2)

## 2022-04-10 LAB — I-STAT BETA HCG BLOOD, ED (MC, WL, AP ONLY): I-stat hCG, quantitative: <5 m[IU]/mL (ref <5)

## 2022-04-10 LAB — CK: Total CK: 253 U/L — ABNORMAL HIGH (ref 38–234)

## 2022-04-10 MED ORDER — BENZTROPINE MESYLATE 1 MG PO TABS
1.0000 mg | ORAL_TABLET | Freq: Two times a day (BID) | ORAL | Status: DC
  Administered 2022-04-10 – 2022-05-09 (×59): 1 mg via ORAL
  Filled 2022-04-10 (×61): qty 1

## 2022-04-10 MED ORDER — OLANZAPINE 10 MG PO TABS
10.0000 mg | ORAL_TABLET | Freq: Every day | ORAL | Status: DC
  Administered 2022-04-10 – 2022-04-17 (×8): 10 mg via ORAL
  Filled 2022-04-10 (×9): qty 1

## 2022-04-10 MED ORDER — ACETAMINOPHEN 325 MG PO TABS
650.0000 mg | ORAL_TABLET | Freq: Once | ORAL | Status: AC
  Administered 2022-04-10: 650 mg via ORAL
  Filled 2022-04-10: qty 2

## 2022-04-10 MED ORDER — DIVALPROEX SODIUM 500 MG PO DR TAB
500.0000 mg | DELAYED_RELEASE_TABLET | Freq: Two times a day (BID) | ORAL | Status: DC
  Administered 2022-04-10 – 2022-04-15 (×11): 500 mg via ORAL
  Filled 2022-04-10: qty 2
  Filled 2022-04-10: qty 1
  Filled 2022-04-10: qty 2
  Filled 2022-04-10 (×3): qty 1
  Filled 2022-04-10: qty 2
  Filled 2022-04-10 (×2): qty 1
  Filled 2022-04-10 (×2): qty 2

## 2022-04-10 MED ORDER — LACTATED RINGERS IV BOLUS
1000.0000 mL | Freq: Once | INTRAVENOUS | Status: AC
  Administered 2022-04-10: 1000 mL via INTRAVENOUS

## 2022-04-10 MED ORDER — POTASSIUM CHLORIDE CRYS ER 20 MEQ PO TBCR
40.0000 meq | EXTENDED_RELEASE_TABLET | Freq: Once | ORAL | Status: AC
  Administered 2022-04-10: 40 meq via ORAL
  Filled 2022-04-10: qty 2

## 2022-04-10 NOTE — ED Notes (Signed)
Patient refusing to allow RN to draw labs, first patient refused blood draw, then patient requesting "someone else" to draw labs. Patient verbalized understanding that without blood draw we are unable to help her. Patient remained sleepy throughout conversation.

## 2022-04-10 NOTE — ED Notes (Signed)
Pt refusing to take off coat. BP taken over coat sleeve.

## 2022-04-10 NOTE — ED Notes (Signed)
Patient agreed to let RN draw blood. RN inserted butterfly needle at which point patient grabbed the butterfly needle and removed it from her arm and stated that she did not want to anymore.

## 2022-04-10 NOTE — ED Notes (Signed)
MD placed [place] line in patient forearm, patient requested soap and to shower at that time. Patient approached nurses station and requested dinner tray and soap. Patient provided dinner tray.

## 2022-04-10 NOTE — ED Notes (Signed)
NT attempted to assist patient to shower. Patient remained uncooperative but eventually agreed to shower, only to then change her mind and sit down, refusing to bathe and put on clean clothes. MD made aware.

## 2022-04-10 NOTE — ED Notes (Signed)
Belongings placed in locker 1. No valuables with security.

## 2022-04-10 NOTE — ED Notes (Signed)
Patient uncooperative in triage, refusing to answer questions and refusing to describe why she wanted to come to the emergency department.

## 2022-04-10 NOTE — ED Notes (Signed)
RN spoke with legal guardian. Legal guardian reports that she tried to IVC the patient recently and the patient was discharged. Legal guardian reports that the patient has been refusing medications recently and is currently undomiciled due to her noncompliance and disruptive behavior when she is placed in facilities. Legal guardian reports that she is currently trying to place the patient in a home but is unable to because she is unstable. RN informed ED Provider.

## 2022-04-10 NOTE — ED Provider Triage Note (Signed)
Emergency Medicine Provider Triage Evaluation Note  Molly [name] , a 36 y.o. female  was evaluated in triage.  Pt complains of medication need?  When asked why patient was brought to the ER patient stares and does not answer any questions.  Patient shaking.  History of schizophrenia.  Patient will not answer any of my questions in triage.  Then asked RN for Tylenol because of her "emotions".  Review of Systems  Positive: Behavior problem? Negative:   Physical Exam  BP (!) 138/100 (BP Location: Left Arm)   Pulse (!) 116   Temp 98.4 F (36.9 C)   Resp 16   SpO2 99%  Gen:   Awake, no distress   Resp:  Normal effort  MSK:   Moves extremities without difficulty  Other:    Medical Decision Making  Medically screening exam initiated at 12:09 PM.  Appropriate orders placed.  Molly [name] was informed that the remainder of the evaluation will be completed by another provider, this initial triage assessment does not replace that evaluation, and the importance of remaining in the ED until their evaluation is complete.  Medical clearance labs   Molly [name] 04/10/22 1210

## 2022-04-10 NOTE — ED Notes (Signed)
Patient remains sleepy and continues to refuse blood draw.

## 2022-04-10 NOTE — ED Notes (Signed)
Patient belongings in wheelchair removed and placed in a separate room, patient willingly placed remaining belongings into patient belonging bags at the nurses station. Patient wanded by security at this time.

## 2022-04-10 NOTE — ED Notes (Addendum)
Pt refused EKG multiple times. Education provided about EKG and risks of not obtaining EKG.

## 2022-04-10 NOTE — ED Provider Notes (Signed)
[facility] EMERGENCY DEPARTMENT Provider Note   CSN: [id number] Arrival date & time: 04/10/22  1152     History  Chief Complaint  Patient presents with   Medical Clearance    Molly [name] is a 36 y.o. female.  HPI Patient presents for hallucinations.  Medical history includes DM, eczema, schizophrenia, aggressive behavior, cognitive delay, HTN, asthma, depression, homelessness.  She was seen in the ED 2 days ago requesting medication refill.  She was given prescriptions for short courses of her home Depakote and Zyprexa.  Today she was brought in by EMS from downtown at the patient's request.  She was reportedly wandering streets and has been able to call 911 for her.  She states that she was previously on Invega shots.  She states that her last 1 was early summer.  She feels that her psychiatric medications are not currently working.  She does endorse ongoing hallucinations.  Although she previously reported abdominal pain, she denies any this currently.  She states that she feels soreness in her thighs.  She states that she is currently homeless.     Home Medications Prior to Admission medications   Medication Sig Start Date End Date Taking? Authorizing Provider  benztropine (COGENTIN) 1 MG tablet Take 1 mg by mouth 2 (two) times daily. 03/02/22   [provider]  divalproex (DEPAKOTE) 500 MG DR tablet Take 1 tablet (500 mg total) by mouth 2 (two) times daily for 7 days. 04/08/22 04/15/22  Marcello Fennel, PA-C  haloperidol (HALDOL) 10 MG tablet Take 1 tablet (10 mg total) by mouth 2 (two) times daily. 01/29/22   Rozetta Nunnery, NP  OLANZapine (ZYPREXA) 10 MG tablet Take 1 tablet (10 mg total) by mouth daily for 7 days. 04/08/22 04/15/22  Marcello Fennel, PA-C  paliperidone (INVEGA SUSTENNA) 156 MG/ML SUSY injection Inject 156 mg into the muscle once.    [provider]      Allergies    Abilify [aripiprazole]    Review of Systems    Review of Systems  Musculoskeletal:  Positive for myalgias.  Psychiatric/Behavioral:  Positive for hallucinations. Negative for suicidal ideas. The patient is nervous/anxious.   All other systems reviewed and are negative.   Physical Exam Updated Vital Signs BP 116/77 (BP Location: Left Arm)   Pulse (!) 42   Temp 98.5 F (36.9 C)   Resp 16   SpO2 94%  Physical Exam Vitals and nursing note reviewed.  Constitutional:      General: She is not in acute distress.    Appearance: Normal appearance. She is well-developed. She is not ill-appearing, toxic-appearing or diaphoretic.  HENT:     Head: Normocephalic and atraumatic.     Right Ear: External ear normal.     Left Ear: External ear normal.     Nose: Nose normal.     Mouth/Throat:     Mouth: Mucous membranes are moist.  Eyes:     Extraocular Movements: Extraocular movements intact.     Conjunctiva/sclera: Conjunctivae normal.  Cardiovascular:     Rate and Rhythm: Regular rhythm. Tachycardia present.     Heart sounds: No murmur heard. Pulmonary:     Effort: Pulmonary effort is normal. No respiratory distress.  Abdominal:     General: There is no distension.     Palpations: Abdomen is soft.  Musculoskeletal:        General: No swelling. Normal range of motion.     Cervical back: Normal range of  motion and neck supple.  Skin:    General: Skin is warm and dry.     Capillary Refill: Capillary refill takes less than 2 seconds.     Coloration: Skin is not jaundiced or pale.  Neurological:     General: No focal deficit present.     Mental Status: She is alert and oriented to person, place, and time.  Psychiatric:        Mood and Affect: Mood is anxious. Affect is flat.        Speech: Speech normal.        Behavior: Behavior is uncooperative and withdrawn.     ED Results / Procedures / Treatments   Labs (all labs ordered are listed, but only abnormal results are displayed) Labs Reviewed  COMPREHENSIVE METABOLIC PANEL -  Abnormal; Notable for the following components:      Result Value   Potassium 3.3 (*)    CO2 20 (*)    Glucose, Bld 157 (*)    Creatinine, Ser 1.05 (*)    Calcium 8.8 (*)    All other components within normal limits  CBC - Abnormal; Notable for the following components:   WBC 16.0 (*)    RDW 17.7 (*)    All other components within normal limits  CK - Abnormal; Notable for the following components:   Total CK 253 (*)    All other components within normal limits  ETHANOL  SALICYLATE LEVEL  ACETAMINOPHEN LEVEL  RAPID URINE DRUG SCREEN, HOSP PERFORMED  URINALYSIS, ROUTINE W REFLEX MICROSCOPIC  I-STAT BETA HCG BLOOD, ED ([facility], [facility], [facility] ONLY)    EKG None  Radiology No results found.  Procedures Procedures    Medications Ordered in ED Medications  divalproex (DEPAKOTE) DR tablet 500 mg (500 mg Oral Given 04/10/22 1336)  benztropine (COGENTIN) tablet 1 mg (1 mg Oral Given 04/10/22 1335)  OLANZapine (ZYPREXA) tablet 10 mg (10 mg Oral Given 04/10/22 1336)  lactated ringers bolus 1,000 mL (has no administration in time range)  potassium chloride SA (KLOR-CON M) CR tablet 40 mEq (has no administration in time range)  acetaminophen (TYLENOL) tablet 650 mg (650 mg Oral Given 04/10/22 1214)    ED Course/ Medical Decision Making/ A&P                           Medical Decision Making Amount and/or Complexity of Data Reviewed Labs: ordered.  Risk Prescription drug management.   This patient presents to the ED for concern of hallucinations, this involves an extensive number of treatment options, and is a complaint that carries with it a high risk of complications and morbidity.  The differential diagnosis includes decompensated schizophrenia, medication nonadherence, intoxication, infection, metabolic derangements   Co morbidities that complicate the patient evaluation  DM, eczema, schizophrenia, aggressive behavior, cognitive delay, HTN, asthma, depression,  homelessness   Additional history obtained:  Additional history obtained from N/A External records from outside source obtained and reviewed including EMR   Lab Tests:  I Ordered, and personally interpreted labs.  The pertinent results include: A leukocytosis is present which appears to be consistent with prior lab work.  Creatinine slightly increased above baseline.  She has mild hypokalemia with otherwise normal electrolytes.  CK is mildly elevated.  Cardiac Monitoring: / EKG:  The patient was maintained on a cardiac monitor.  I personally viewed and interpreted the cardiac monitored which showed an underlying rhythm of: Sinus rhythm   Consultations Obtained:  I requested consultation with the TTS,  and discussed lab and imaging findings as well as pertinent plan - they recommend: (Pending)   Problem List / ED Course / Critical interventions / Medication management  Patient presents to the ED for unclear reasons.  Although she previously endorsed abdominal pain, she denies any currently.  She does state that her schizophrenia medications do not seem to be working.  When asked to elaborate further, patient is unable to.  She states that she has been having worsened hallucinations.  She is unable to specify if these are visual, auditory, or tactile.  During ED interview, she does seem to stare off in space and there is concern of response to internal stimuli.  She was seen in the ED 2 days ago and given prescriptions for short courses of her home Depakote and Zyprexa.  Patient is unable to state if she has been taking these or not.  She does endorse bilateral thigh pain.  She is currently homeless.  She is unable to say where she stays.  Medical clearance workup was initiated.  Home doses of Zyprexa, Depakote, and Cogentin were ordered.  Although patient was initially refusing any lab work, I was able to place a ultrasound-guided peripheral IV and draw blood.  Patient is disheveled and seems  to be soiled in urine.  Although she initially refused shower, she was ultimately agreeable to 1.  Initial lab work is notable for mild hypokalemia.  Replacement potassium was ordered.  Creatinine is slightly elevated above baseline.  IV fluids were ordered.  A leukocytosis is present which seems to be a chronic condition for her.  Urine studies are pending collection of sample.  At this time, patient is medically cleared.  TTS consult was ordered.  At this time, patient does not appear to be infected harm to her self or others.  I do have concern for decompensated schizophrenia given her report of hallucinations.  Patient to remain in the ED voluntarily awaiting TTS consult. Ordered medication including Depakote, Zyprexa, Cogentin for ongoing management of schizophrenia; IVF for hydration; potassium chloride for hypokalemia Reevaluation of the patient after these medicines showed that the patient improved I have reviewed the patients home medicines and have made adjustments as needed   Social Determinants of Health:  History of psychiatric illness and homelessness         Final Clinical Impression(s) / ED Diagnoses Final diagnoses:  Hallucinations    Rx / DC Orders ED Discharge Orders     None         Godfrey Pick, MD 04/10/22 2113

## 2022-04-10 NOTE — ED Notes (Signed)
Patient observed to be sleeping. Patient responded to name and cooperated by taking PO meds. Patient reports that she already had blood drawn. Patient observed to have a flat affect and provides short answers.

## 2022-04-10 NOTE — ED Notes (Signed)
Patient agreed to enter room 2 to clean up, patient entered and exited the room several times throughout and continued to shut the door after being told it needed to remain open for safety. Patient yelled at staff multiple times. Patient required coaching to change out of urine soaked gowns and clean herself. Patient refused to change into clean underwear and clean shirt. Patient returned to hall bed and was provided beverages. After returning to hall bed patient observed sobbing hysterically and was unable to provide immediate reason or assistance that could be provided.

## 2022-04-10 NOTE — ED Triage Notes (Signed)
Patient BIB [facility] from [place] at the patient's request. EMS states patient had been wandering the streets asking people to call 911 for her to which EMS had responded multiple times but then the patient refused to go be transported by EMS. This EMS crew states they were hailed by the patient as they were driving by the area and she requested to be evaluated for abdominal pain that started four months ago. Patient also states that she is currently being crucified. Patient is currently eating a piece of chicken and bread from a wet pillow case, patient has been encouraged to stop eating this food.

## 2022-04-10 NOTE — ED Notes (Signed)
Patient requested soap to shower, RN provided towels and clean gowns and walked patient towards the shower at which point the patient said she did not need to shower and returned to her chair. Patient returned to purple zone to shower and asked for more than one soap and lotion, RN informed patient she could provide those and patient again said no and returned to her chair. Patient currently yelling and hitting the bedside table and refusing to shower.

## 2022-04-11 DIAGNOSIS — F259 Schizoaffective disorder, unspecified: Secondary | ICD-10-CM | POA: Diagnosis not present

## 2022-04-11 LAB — RAPID URINE DRUG SCREEN, HOSP PERFORMED
Amphetamines: NOT DETECTED
Barbiturates: NOT DETECTED
Benzodiazepines: NOT DETECTED
Cocaine: NOT DETECTED
Opiates: NOT DETECTED
Tetrahydrocannabinol: NOT DETECTED

## 2022-04-11 LAB — URINALYSIS, ROUTINE W REFLEX MICROSCOPIC
Bacteria, UA: NONE SEEN
Bilirubin Urine: NEGATIVE
Glucose, UA: NEGATIVE mg/dL
Ketones, ur: 5 mg/dL — AB
Leukocytes,Ua: NEGATIVE
Nitrite: NEGATIVE
Protein, ur: 30 mg/dL — AB
Specific Gravity, Urine: 1.027 (ref 1.005–1.030)
pH: 5 (ref 5.0–8.0)

## 2022-04-11 MED ORDER — ZIPRASIDONE MESYLATE 20 MG IM SOLR
20.0000 mg | INTRAMUSCULAR | Status: AC | PRN
  Administered 2022-04-11: 20 mg via INTRAMUSCULAR
  Filled 2022-04-11: qty 20

## 2022-04-11 MED ORDER — LORAZEPAM 2 MG/ML IJ SOLN
1.0000 mg | Freq: Once | INTRAMUSCULAR | Status: AC
  Administered 2022-04-11: 1 mg via INTRAMUSCULAR
  Filled 2022-04-11: qty 1

## 2022-04-11 MED ORDER — ZIPRASIDONE MESYLATE 20 MG IM SOLR
10.0000 mg | Freq: Once | INTRAMUSCULAR | Status: AC
  Administered 2022-04-11: 10 mg via INTRAMUSCULAR
  Filled 2022-04-11: qty 20

## 2022-04-11 MED ORDER — LORAZEPAM 1 MG PO TABS
1.0000 mg | ORAL_TABLET | ORAL | Status: AC | PRN
  Administered 2022-04-11: 1 mg via ORAL
  Filled 2022-04-11: qty 1

## 2022-04-11 MED ORDER — OLANZAPINE 5 MG PO TBDP
5.0000 mg | ORAL_TABLET | Freq: Three times a day (TID) | ORAL | Status: DC | PRN
  Administered 2022-04-11 – 2022-04-12 (×2): 5 mg via ORAL
  Filled 2022-04-11 (×3): qty 1

## 2022-04-11 MED ORDER — STERILE WATER FOR INJECTION IJ SOLN
INTRAMUSCULAR | Status: AC
  Filled 2022-04-11: qty 10

## 2022-04-11 NOTE — ED Notes (Signed)
Pt given feminine hygiene supplies, pads, mesh underwear, wipes, cleanser spray. Sitter present.

## 2022-04-11 NOTE — ED Notes (Signed)
Back up to b/r, steady gait.

## 2022-04-11 NOTE — ED Notes (Signed)
Patient getting aggressive, reaching over nurses station counter grabbing phones and throwing objects. Patient asked to go back to her room. Patient refusing. Security has been notified.

## 2022-04-11 NOTE — Progress Notes (Signed)
CSW spoke with Molly [name] regarding this patient. It was reported that the patient was declined due to currently no appropriate beds available.   Crissie Reese, MSW, Lenice Pressman Phone: [phone] Disposition/TOC

## 2022-04-11 NOTE — Progress Notes (Signed)
Per Cecilio Asper, NP, patient meets criteria for inpatient treatment. There are no available beds at [facility] today. CSW faxed referrals to the following facilities for review:  [facility]  Pending - Request Sent N/A [address], [place] [address] [phone] --  [facility]  Pending - Request Sent N/A [address] [place] [address] [phone] --  [facility]  Pending - Request Sent N/A [address] [place] [address] [phone] --  [facility]  Pending - Request Sent N/A [address] [place] [address] [phone] --  [facility]  Pending - Request Sent N/A [address] [place], [place] [address] [phone] --  [facility]  Pending - Request Sent N/A [address] [place] [address] [phone] --  [facility]  Pending - Request Sent N/A [address], [place] [address] [phone] --  [facility]  Pending - Request Sent N/A [address] [place] [address] [phone] --  [name]  Pending - Request Sent N/A [address] [place] [address] [phone] --  [facility]  Pending - Request Sent N/A [address] [place] [address] [phone] --  [facility]  Pending - Request Sent N/A [address] [place] [address] [phone] --  [facility]  Pending - Request Sent N/A [address], [place] [address] [phone] --  [facility]  Pending - Request Sent N/A [address] [place] [address] [phone] --  [facility]  Pending - Request Sent N/A [address]  [place] [address] [phone] --   TTS will continue to seek bed placement.  Molly [name], MSW, Molly [name] Phone: [phone] Disposition/TOC

## 2022-04-11 NOTE — ED Notes (Signed)
Sitting in h/w recliner, eating.

## 2022-04-11 NOTE — ED Provider Notes (Signed)
Patient became agitated and aggressive towards staff, throwing objects.  She has been evaluated by psychiatry with recommendation for inpatient care.  IVC completed for patient and staff safety.  She was treated with Geodon, lorazepam for her aggression.   Tilden Fossa, MD 04/11/22 [phone]

## 2022-04-11 NOTE — ED Notes (Signed)
Pt becoming increasingly aggressive. Screaming in hallways and going into other patients room. Pt throwing cup of water at RN when walking by. Redirection and verbal deescalation unsuccessful. MD notified - medications ordered and given.

## 2022-04-11 NOTE — ED Notes (Signed)
Patient came up to nurses station and requested to make a phone call. Patient is currently on the phone.

## 2022-04-11 NOTE — ED Notes (Addendum)
Agitation, voices, delusions, hallucinations increasing. Verbalizes, "Officer/security wants me dead, people having sex on the other side of the walls". Sitter present. Sitting in recliner when not wandering intermittently.

## 2022-04-11 NOTE — ED Notes (Signed)
Given BS fan, attempting VS which she has refused thus far.

## 2022-04-11 NOTE — ED Notes (Signed)
Pt removed PAPR willingly upon request

## 2022-04-11 NOTE — ED Notes (Signed)
Discussed patient with [facility].

## 2022-04-11 NOTE — ED Notes (Signed)
Wandering unit, up to nurses station frequently asking for various things..."to look numbers up on the internet and phone use".

## 2022-04-11 NOTE — Progress Notes (Signed)
CSW spoke with Lunette with [facility]. It was reported that the patient is under further review.   Crissie Reese, MSW, Lenice Pressman Phone: [phone] Disposition/TOC

## 2022-04-11 NOTE — ED Notes (Signed)
Pt grabbed PAPR from hallway/desk an placed on head, currently wearing PAPR.

## 2022-04-11 NOTE — ED Notes (Signed)
TTS at bedside. 

## 2022-04-11 NOTE — ED Notes (Signed)
Refusing some things, accepting of others. Given hand gel and chap stick per request.

## 2022-04-11 NOTE — ED Notes (Signed)
Pt received meds, crushed them on table in front of her, licked her finger and wiped up powder with her wet finger. Not ideal, but I believe she got all her medicine.

## 2022-04-11 NOTE — ED Notes (Signed)
Pt is sleeping at this time.

## 2022-04-11 NOTE — ED Provider Notes (Signed)
Emergency Medicine Observation Re-evaluation Note  Molly [name] is a 36 y.o. female, seen on rounds today.  Pt initially presented to the ED for complaints of Medical Clearance Currently, the patient is sitting in chair.  Physical Exam  BP 118/71   Pulse (!) 112   Temp 98.1 F (36.7 C)   Resp 16   SpO2 99%  Physical Exam General: Awake, alert, nondistressed Cardiac: Extremities perfused Lungs: Breathing is unlabored Psych: No current agitation  ED Course / MDM  EKG:   I have reviewed the labs performed to date as well as medications administered while in observation.  Recent changes in the last 24 hours include evaluation by TTS with recommendations for inpatient psychiatric admission.  She did become agitated earlier this morning and required Geodon and Ativan.  Plan  Current plan is for inpatient psychiatric admission.    Gloris Manchester, MD 04/11/22 1055
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
## 2022-04-11 NOTE — ED Notes (Signed)
Pt menstrual cycle had began and pt began bleeding on scrubs and blankets in the hallway. This RN noticed bleeding and had asked if she wanted a pad, This RN along with another RN walked the pt to the restroom so she can change. Fresh blankets were provided for her as well.

## 2022-04-11 NOTE — ED Notes (Signed)
While using restroom to give urine sample, pt smoked cigarette in bathroom. With security assistance, pt undressed and removed hidden belongings and changed into new scrubs.

## 2022-04-11 NOTE — ED Notes (Signed)
Ambulatory to b/r, back to recliner

## 2022-04-11 NOTE — ED Notes (Addendum)
Given chocolate ice cream. Encouraged pt to be kind.

## 2022-04-11 NOTE — ED Notes (Signed)
Pt refused vitals stated she just need a urine cup to pee in.

## 2022-04-11 NOTE — ED Notes (Signed)
Pt walking back to hall recliner, sat down, eating/ preparing breakfast provided on BS table. Alert, NAD, calm, guarded, reserved, polite. Denies questions or needs.

## 2022-04-11 NOTE — ED Notes (Signed)
IVC PAPERWORK COMPLETE

## 2022-04-12 DIAGNOSIS — F259 Schizoaffective disorder, unspecified: Secondary | ICD-10-CM | POA: Diagnosis not present

## 2022-04-12 MED ORDER — STERILE WATER FOR INJECTION IJ SOLN
INTRAMUSCULAR | Status: AC
  Administered 2022-04-12: 1.2 mL
  Filled 2022-04-12: qty 10

## 2022-04-12 MED ORDER — ZIPRASIDONE MESYLATE 20 MG IM SOLR
20.0000 mg | Freq: Once | INTRAMUSCULAR | Status: AC
  Administered 2022-04-12: 20 mg via INTRAMUSCULAR
  Filled 2022-04-12: qty 20

## 2022-04-12 MED ORDER — LORAZEPAM 1 MG PO TABS
1.0000 mg | ORAL_TABLET | Freq: Once | ORAL | Status: AC
  Administered 2022-04-12: 1 mg via ORAL
  Filled 2022-04-12: qty 1

## 2022-04-12 NOTE — ED Notes (Signed)
Pt.s breakfast tray has arrived.   

## 2022-04-12 NOTE — ED Notes (Signed)
Patient woke up asking when she would be transferred to another facility. Explained to patient that she is waiting acceptance from a facility. Patient calm and cooperative, patient cleaned up her area and agreed to take her medications.

## 2022-04-12 NOTE — ED Notes (Signed)
IVC paperwork is now in purple zone, clipped to clipboard for 49.

## 2022-04-12 NOTE — ED Notes (Signed)
Meal tray provided to patient.

## 2022-04-12 NOTE — ED Provider Notes (Signed)
Emergency Medicine Observation Re-evaluation Note  Molly [name] is a 36 y.o. female, seen on rounds today.  Pt initially presented to the ED for complaints of Medical Clearance Currently, the patient is resting comfortably.  Physical Exam  BP (!) 148/96 (BP Location: Left Wrist)   Pulse (!) 102   Temp 98 F (36.7 C)   Resp 18   SpO2 98%  Physical Exam General: NAD   ED Course / MDM  EKG:   I have reviewed the labs performed to date as well as medications administered while in observation.  Recent changes in the last 24 hours include mild agitation resolved by verbal calming.  Plan  Current plan is for inpatient psychiatric placement.    Wynetta Fines, MD 04/12/22 [phone]

## 2022-04-12 NOTE — ED Notes (Signed)
Pt's dinner has arrived, pt sitting up and eating her dinner 

## 2022-04-12 NOTE — Progress Notes (Signed)
Pt meets inpatient behavioral health placement per Hillery Jacks, NP. CSW called [facility] and spoke with Randi at 11:48am and advised that the weekend team did not make a decision on the referral. Randi requested CSW send referral again. CSW sent referral to [facility] along with out of network providers:   Destination  Service Provider Address Phone Fax  [facility]  [address] [place] [address] [phone]  [facility] [address] [facility] [facility]  [address] [place] [address] [phone]  [facility]  [address], [place] [address] [phone]  [facility]  [address] [place] [address] [phone]  [facility]  [address] [place], [place] [address] [phone]  [facility]  [address] [place] [address] [phone]  [facility]  [address] [place] [address] [phone]  [facility]  [address], [place] [address] [phone]  [facility]  [address] [place] [address] [phone]  [facility]  [address], [place] [address] [phone]  [facility]  [address] [place] [address] [phone]  [facility]  [address] [place] [address] [phone]  [facility]  [address] [place] [address] [phone]  [facility]  [address] [place], [place] [address] [phone]  [facility]  [address] [place] [address] [phone]  [facility]  [address] [place], [place]  [place] [address] [phone]  [facility] [place] [address] [phone]  [facility]  [address], [place] [address] [phone]    Maryjean Ka, MSW, LCSWA 04/12/2022 12:50 PM

## 2022-04-12 NOTE — ED Notes (Signed)
IVC'd 04/11/22, exp 04/18/22. IVC documents currently in orange zone.

## 2022-04-12 NOTE — ED Notes (Signed)
ED Provider at bedside. 
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
## 2022-04-12 NOTE — ED Notes (Signed)
Patient continues to ask the staff questions and demand for things; pt at desk asking for the phone when no phone was given patient walked to another unit with security and [employer] following; pt escorted back to unit; EDP notified of patient's escalating behaviors; New orders placed-Monique,RN

## 2022-04-12 NOTE — ED Notes (Signed)
Pt has been back and forth at nurse's station, requesting for her medicine, insurance card, if she'll be leaving, etc. Pt then will start to hysterically cry. Writer has tried to calm pt down, but pt continues to walk up to other staff members about different things. Pt currently asking to talk to a doctor to be discharged.

## 2022-04-12 NOTE — ED Provider Notes (Signed)
Notified that pt is more agitated crying.  Pt has been slamming doors, crying disruptive.  Required geodon yesterday.  Will give geodon now.  Continue to monitor closely.     Linwood Dibbles, MD 04/12/22 2028
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
## 2022-04-12 NOTE — ED Notes (Signed)
Pt is asking for nail polish and chapstick and states that we took her chapstick from her

## 2022-04-12 NOTE — ED Notes (Signed)
Pt transferred into Purple zone room 49. Pt was noncompliant at the beginning, but after 2 RN's, 1 security guard and 1 police officer, pt walked right into purple zone with all of her belongings that she had accumulated from last night. Pt now in her room, writer sitting on bed at the moment, door opened

## 2022-04-12 NOTE — ED Notes (Signed)
Writer made aware by NT on unit, to collect pt's EKG. Pt refused to allow EKG be taken, despite being made aware before, during and after the refusal of her EKG. Pt stated "my heart is fine, I take this medicine that I know needs to have my blood or blood sugar collected. Can you do that instead?". Writer explained to pt, that that may be the case but only and EKG has been ordered. Pt currently sitting on a recliner in front of hallway 1

## 2022-04-12 NOTE — Progress Notes (Addendum)
[facility] MD Progress Note  04/12/2022 4:07 PM Molly [name]  MRN:  [id number]  Subjective:  Molly [name] stated " I do not want to go to another hospital at this point I want  to go home or or to the [facility]."  Molly [name] observed sitting in hall chair.  She is tearful and slightly disorganized but pleasant.  Was reported that patient needs constant redirection.  She is requesting to take her medications including.  States " just sent me to the [facility]."  Denying auditory or visual hallucinations.    Per chart review it is documented that patient has been noncompliant with medication and experiencing hallucinations. UDS -.  Patient was under review at [facility].  CSW to continue to follow-up.  This provider will continue to recommend inpatient admission.   Per admission assessment note:" Pt is a 36 year old single female with a diagnosis of schizophrenia who presents unaccompanied to [facility] ED with acute psychosis. According to MEDICAL RECORD NUMBER"EMS states patient had been wandering the streets asking people to call 911 for her to which EMS had responded multiple times but then the patient refused to go be transported by EMS. This EMS crew states they were hailed by the patient as they were driving by the area and she requested to be evaluated for abdominal pain that started four months ago. Patient also states that she is currently being crucified. Patient is currently eating a piece of chicken and bread from a wet pillow case, patient has been encouraged to stop eating this food."    Principal Problem: <principal problem not specified> Diagnosis: Active Problems:   Schizoaffective disorder (HCC)   Involuntary commitment  Total Time spent with patient: 15 minutes  Past Psychiatric History:   Past Medical History:  Past Medical History:  Diagnosis Date   Abnormal Pap smear    ASC-cannot exclude HGSIL on Pap 02/15/2012   ASC-US on 02/03/2012 pap (associated Trichomonas infection). No  reflex HPV testing performed on specimen.  Patient informed that she will need repeat Pap in one year.       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 Dennie Bible (Psych) At [facility] in  [place], [place]. 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   Qualifier: Diagnosis of  By: McDiarmid MD, Benjaman Pott USER 02/08/2009   Qualifier: Diagnosis of  By: Knox Royalty      Past Surgical History:  Procedure Laterality Date   INCISION AND DRAINAGE     pilanodal cyst   TIBIA IM NAIL INSERTION Right 10/26/2021   Procedure: INTRAMEDULLARY (IM) NAIL TIBIAL;  Surgeon: Myrene Galas, MD;  Location: [facility] OR;  Service: Orthopedics;  Laterality: Right;   TOOTH EXTRACTION N/A 10/07/2017   Procedure: EXTRACTION TEETH NUMBERS ONE, SEVENTEEN, NINETEEN AND THIRTY TWO;  Surgeon: Ocie Doyne, DDS;  Location: [facility]  OR;  Service: Oral Surgery;  Laterality: N/A;   Family History:  Family History  Adopted: Yes  Problem Relation Age of Onset   Bipolar disorder Sister    Alcohol abuse Brother    Cancer Father    Diabetes Mother    Family Psychiatric  History:  Social History:  Social History   Substance and Sexual Activity  Alcohol Use No   Comment: occ     Social History   Substance and Sexual Activity  Drug Use No    Social History   Socioeconomic History   Marital status: Single    Spouse name: Not on file   Number of children: Not on file   Years of  education: Not on file   Highest education level: Not on file  Occupational History   Not on file  Tobacco Use   Smoking status: Every Day    Packs/day: 2.00    Years: 10.00    Total pack years: 20.00    Types: Cigarettes   Smokeless tobacco: Never  Vaping Use   Vaping Use: Never used  Substance and Sexual Activity   Alcohol use: No    Comment: occ   Drug use: No   Sexual activity: Yes    Birth control/protection: Injection  Other Topics Concern   Not on file  Social History Narrative   Adopted   Has Guardian   [place] with [name]   Transportation: Bus   Social Determinants of Health   Financial Resource Strain: Not on file  Food Insecurity: Not on file  Transportation Needs: Not on file  Physical Activity: Not on file  Stress: Not on file  Social Connections: Not on file   Additional Social History:    Pain Medications: See [facility] Prescriptions: See MAR Over the Counter: See MAR History of alcohol / drug use?: No history of alcohol / drug abuse Longest period of sobriety (when/how long): NA                    Sleep: Fair  Appetite:  Fair  Current Medications: Current Facility-Administered Medications  Medication Dose Route Frequency Provider Last Rate Last Admin   benztropine (COGENTIN) tablet 1 mg  1 mg Oral BID Gloris Manchesterixon, Ryan, MD   1 mg at 04/12/22 0949   divalproex (DEPAKOTE) DR tablet 500 mg  500 mg Oral BID Gloris Manchesterixon, Ryan, MD   500 mg at 04/12/22 0948   OLANZapine (ZYPREXA) tablet 10 mg  10 mg Oral Daily Gloris Manchesterixon, Ryan, MD   10 mg at 04/12/22 0949   OLANZapine zydis (ZYPREXA) disintegrating tablet 5 mg  5 mg Oral Q8H PRN Gloris Manchesterixon, Ryan, MD   5 mg at 04/11/22 [id number]   Current Outpatient Medications  Medication Sig Dispense Refill   benztropine (COGENTIN) 1 MG tablet Take 1 mg by mouth 2 (two) times daily.     divalproex (DEPAKOTE) 500 MG DR tablet Take 1 tablet (500 mg total) by mouth 2 (two) times daily for 7 days. 14 tablet 0    haloperidol (HALDOL) 10 MG tablet Take 1 tablet (10 mg total) by mouth 2 (two) times daily. 60 tablet 0   lisinopril (ZESTRIL) 5 MG tablet Take 5 mg by mouth daily.     OLANZapine (ZYPREXA) 10 MG tablet Take 1 tablet (10 mg total) by mouth daily for 7 days. 7 tablet 0   paliperidone (INVEGA SUSTENNA) 156 MG/ML SUSY injection Inject 156 mg into the muscle once.  Lab Results:  Results for orders placed or performed during the hospital encounter of 04/10/22 (from the past 48 hour(s))  Comprehensive metabolic panel     Status: Abnormal   Collection Time: 04/10/22  7:32 PM  Result Value Ref Range   Sodium 138 135 - 145 mmol/L   Potassium 3.3 (L) 3.5 - 5.1 mmol/L   Chloride 104 98 - 111 mmol/L   CO2 20 (L) 22 - 32 mmol/L   Glucose, Bld 157 (H) 70 - 99 mg/dL    Comment: Glucose reference range applies only to samples taken after fasting for at least 8 hours.   BUN 9 6 - 20 mg/dL   Creatinine, Ser 5.10 (H) 0.44 - 1.00 mg/dL   Calcium 8.8 (L) 8.9 - 10.3 mg/dL   Total Protein 7.1 6.5 - 8.1 g/dL   Albumin 3.5 3.5 - 5.0 g/dL   AST 31 15 - 41 U/L   ALT 17 0 - 44 U/L   Alkaline Phosphatase 109 38 - 126 U/L   Total Bilirubin 0.4 0.3 - 1.2 mg/dL   GFR, Estimated >25 >85 mL/min    Comment: (NOTE) Calculated using the CKD-EPI Creatinine Equation (2021)    Anion gap 14 5 - 15    Comment: Performed at [facility], [address], [place], [place] [address]  cbc     Status: Abnormal   Collection Time: 04/10/22  7:32 PM  Result Value Ref Range   WBC 16.0 (H) 4.0 - 10.5 K/uL   RBC 4.29 3.87 - 5.11 MIL/uL   Hemoglobin 12.5 12.0 - 15.0 g/dL   HCT 42.3 53.6 - 14.4 %   MCV 84.8 80.0 - 100.0 fL   MCH 29.1 26.0 - 34.0 pg   MCHC 34.3 30.0 - 36.0 g/dL   RDW 31.5 (H) 40.0 - 86.7 %   Platelets 389 150 - 400 K/uL   nRBC 0.0 0.0 - 0.2 %    Comment: Performed at [facility], [address] [place], [place] [address]  CK     Status: Abnormal   Collection Time: 04/10/22  7:32 PM  Result  Value Ref Range   Total CK 253 (H) 38 - 234 U/L    Comment: Performed at [facility], [address] [place], [place] [address]  I-Stat beta hCG blood, ED     Status: None   Collection Time: 04/10/22  7:38 PM  Result Value Ref Range   I-stat hCG, quantitative <5.0 <5 mIU/mL   Comment 3            Comment:   GEST. AGE      CONC.  (mIU/mL)   <=1 WEEK        5 - 50     2 WEEKS       50 - 500     3 WEEKS       100 - 10,000     4 WEEKS     1,000 - 30,000        FEMALE AND NON-PREGNANT FEMALE:     LESS THAN 5 mIU/mL   Rapid urine drug screen (hospital performed)     Status: None   Collection Time: 04/11/22  2:44 AM  Result Value Ref Range   Opiates NONE DETECTED NONE DETECTED   Cocaine NONE DETECTED NONE DETECTED   Benzodiazepines NONE DETECTED NONE DETECTED   Amphetamines NONE DETECTED NONE DETECTED   Tetrahydrocannabinol NONE DETECTED NONE DETECTED   Barbiturates  NONE DETECTED NONE DETECTED    Comment: (NOTE) DRUG SCREEN FOR MEDICAL PURPOSES ONLY.  IF CONFIRMATION IS NEEDED FOR ANY PURPOSE, NOTIFY LAB WITHIN 5 DAYS.  LOWEST DETECTABLE LIMITS FOR URINE DRUG SCREEN Drug Class                     Cutoff (ng/mL) Amphetamine and metabolites    1000 Barbiturate and metabolites    200 Benzodiazepine                 200 Opiates and metabolites        300 Cocaine and metabolites        300 THC                            50 Performed at [facility], [address], [place], [place] [address]   Urinalysis, Routine w reflex microscopic     Status: Abnormal   Collection Time: 04/11/22  2:44 AM  Result Value Ref Range   Color, Urine AMBER (A) YELLOW    Comment: BIOCHEMICALS MAY BE AFFECTED BY COLOR   APPearance HAZY (A) CLEAR   Specific Gravity, Urine 1.027 1.005 - 1.030   pH 5.0 5.0 - 8.0   Glucose, UA NEGATIVE NEGATIVE mg/dL   Hgb urine dipstick LARGE (A) NEGATIVE   Bilirubin Urine NEGATIVE NEGATIVE   Ketones, ur 5 (A) NEGATIVE mg/dL   Protein, ur 30 (A) NEGATIVE  mg/dL   Nitrite NEGATIVE NEGATIVE   Leukocytes,Ua NEGATIVE NEGATIVE   RBC / HPF 0-5 0 - 5 RBC/hpf   WBC, UA 0-5 0 - 5 WBC/hpf   Bacteria, UA NONE SEEN NONE SEEN   Squamous Epithelial / LPF 0-5 0 - 5   Mucus PRESENT    Hyaline Casts, UA PRESENT     Comment: Performed at [facility], [address] [place], [place] [address]    Blood Alcohol level:  Lab Results  Component Value Date   [facility] <10 04/01/2022   ETH <10 01/18/2022    Metabolic Disorder Labs: Lab Results  Component Value Date   HGBA1C 5.4 10/31/2021   MPG 108.28 10/31/2021   MPG 131.24 10/18/2018   Lab Results  Component Value Date   PROLACTIN 43.4 02/08/2014   Lab Results  Component Value Date   CHOL 145 10/31/2021   TRIG 83 10/31/2021   HDL 33 (L) 10/31/2021   CHOLHDL 4.4 10/31/2021   VLDL 17 10/31/2021   LDLCALC 95 10/31/2021   LDLCALC NOT CALCULATED 10/18/2018    Physical Findings: AIMS:  , ,  ,  ,    CIWA:    COWS:     Musculoskeletal: Strength & Muscle Tone: within normal limits Gait & Station: normal Patient leans: N/A  Psychiatric Specialty Exam:  Presentation  General Appearance:  Casual  Eye Contact: Fair  Speech: Clear and Coherent; Normal Rate  Speech Volume: Normal  Handedness: Right   Mood and Affect  Mood: Euthymic; Anxious  Affect: Blunt; Appropriate (Blunted affect at times, appropriate overall)   Thought Process  Thought Processes: Coherent; Linear  Descriptions of Associations:Intact  Orientation:Full (Time, Place and Person)  Thought Content:Abstract Reasoning; Paranoid Ideation; Delusions; Logical (Mostly logical but with some delusions and paranoid ideation)  History of Schizophrenia/Schizoaffective disorder:Yes  Duration of Psychotic Symptoms:Greater than six months  Hallucinations:No data recorded Ideas of Reference:Delusions  Suicidal Thoughts:No data recorded Homicidal Thoughts:No data recorded  Sensorium  Memory:  Immediate  Fair; Recent Fair  Judgment: Fair  Insight: Fair   Chartered certified accountant: Fair  Attention Span: Fair  Recall: [employer] of Knowledge: Fair  Language: Fair   Psychomotor Activity  Psychomotor Activity:No data recorded  Assets  Assets: Communication Skills; Desire for Improvement; Housing; Social Support   Sleep  Sleep:No data recorded   Physical Exam: Physical Exam Vitals and nursing note reviewed.  Cardiovascular:     Rate and Rhythm: Normal rate.  Psychiatric:        Mood and Affect: Mood normal.        Thought Content: Thought content normal.    Review of Systems  Psychiatric/Behavioral:  Positive for hallucinations. The patient is nervous/anxious.   All other systems reviewed and are negative.  Blood pressure (!) 148/96, pulse (!) 102, temperature 98 F (36.7 C), resp. rate 18, SpO2 98 %, unknown if currently breastfeeding. There is no height or weight on file to calculate BMI.   Treatment Plan Summary: Daily contact with patient to assess and evaluate symptoms and progress in treatment and Medication management  Continue with current treatment plan as listed below except were noted   Continue Zyprexa 10 mg daily Continue Depakote 500 mg p.o. twice daily Continue Cogentin 1 mg p.o. twice daily  See chart for agitation protocol  Continues to meet inpatient admissions  Oneta Rack, NP 04/12/2022, 4:07 PM

## 2022-04-12 NOTE — ED Notes (Signed)
Pt approached desk requesting to leave. Pt states she would like to be discharged, and does not want to leave the [place] area. Process for inpatient placement explained to patient without evidence of learning. Pt encouraged to return to room. Sitter with patient.

## 2022-04-12 NOTE — ED Notes (Signed)
Pt is currently yelling at staff and upset because she doesn't have her chapstick. She is also yelling and asking why is she being committed. She also wants to know when the doctors are coming to see her.

## 2022-04-12 NOTE — ED Notes (Signed)
Patient is starting to pace around nurses station, grabbing objects from counter. Pt is redirectable but pattern continues.

## 2022-04-12 NOTE — ED Notes (Signed)
Pt given 3 crayons and 5 word search/connect the dots worksheets.

## 2022-04-12 NOTE — ED Notes (Signed)
Patient given clean scrubs and toiletry's at her request. Patient also showered. Patient is calm and cooperative.

## 2022-04-12 NOTE — ED Notes (Addendum)
[employer] and security helped to escort patient to new room. Pt informed to take off the blankets that were ripped and wrapped around her as makeshift clothes and panties that were placed on her head. PT complaint with this action. When asked to give staff the three bins of belongings and three make shift bags of belongings pt began to argue with staff. Items were then handed over and placed in locker #10.

## 2022-04-12 NOTE — ED Notes (Signed)
Pt was awaken by RN, pt walked to the bathroom. Writer noticed pt had soiled herself. Writer brought mesh panties, pads and a new set of scrub pants. Pt refused to give writer their old, dirty mesh panties, scrub pants and soiled menstrual pads. She continued to scream and pull her things back towards her. RN and Clinical research associate took all soiled belongings, after several communication attempts, and provided pt with new mesh panties, pants and pads. Pt back in her room.

## 2022-04-12 NOTE — ED Notes (Signed)
Pt became verbally aggressive with this writer when attempting to get her vitals. RN had to explain to pt that she cannot raise her voice, after this pt allowed writer to get her pulse ox but not her temp.

## 2022-04-12 NOTE — ED Notes (Addendum)
Pt is currently crying and slamming doors because she doesn't want to be here. Pt has slammed doors twice and throwing a tantrum. We have informed pt that she has to keep the door open. Pt is upset because she doesn't have chapstick and does not want to be here. Pt told NT to get out of her face.

## 2022-04-12 NOTE — Progress Notes (Signed)
Pt has been denied by [facility] due pt's aggressive behaviors. CSW will assist and follow with placement.  Maryjean Ka, MSW, LCSWA 04/12/2022 1:55 PM

## 2022-04-12 NOTE — ED Notes (Addendum)
Pt is being uncooperative about wanting to get her vitals taken and continuously asking what the tech is doing. She keeps stating that she doesn't want her blood taken since the tech mentioned I'm going to take your blood pressure.

## 2022-04-12 NOTE — ED Notes (Signed)
Pt currently sleeping, will obtain vitals once she wakes up

## 2022-04-12 NOTE — ED Notes (Signed)
Pt visibly overwhelmed, pacing around hallway. Writer asked pt if she would like to lay down on the stretcher, pt requested to make the bed. Writer allowed pt to make the bed, to get her mind off of things. Pt still crying and yhelling when staff members attempt to talk to her

## 2022-04-12 NOTE — ED Notes (Signed)
Pt had thrown up, pt cleaned it up but wanted the Clorox wipes to stay on top of the floor for a while. Pt began yelling at this writer, Clinical research associate attempted to take all of the things out of the room. Another tech helped out. Security at bedside

## 2022-04-12 NOTE — Progress Notes (Signed)
Pt has been denied at [facility]. CSW will continue to seek bed placement for pt.   Maryjean Ka, MSW, LCSWA 04/12/2022 10:30 PM

## 2022-04-12 NOTE — ED Notes (Signed)
Pt currently crying hysterically. Pt came out of her room, requesting for snacks. Writer offered the pt, the available snacks but pt walked back to her room. Slammed the door and began crying loudly. Door opened, pt lying on the bed while crying hysterically.   1851: Pt slammed the door again and crying loudly. Pt threw up

## 2022-04-13 DIAGNOSIS — F250 Schizoaffective disorder, bipolar type: Secondary | ICD-10-CM | POA: Diagnosis not present

## 2022-04-13 DIAGNOSIS — R443 Hallucinations, unspecified: Secondary | ICD-10-CM | POA: Diagnosis not present

## 2022-04-13 DIAGNOSIS — F259 Schizoaffective disorder, unspecified: Secondary | ICD-10-CM | POA: Diagnosis not present

## 2022-04-13 DIAGNOSIS — Z046 Encounter for general psychiatric examination, requested by authority: Secondary | ICD-10-CM | POA: Diagnosis not present

## 2022-04-13 LAB — VALPROIC ACID LEVEL: Valproic Acid Lvl: 47 ug/mL — ABNORMAL LOW (ref 50.0–100.0)

## 2022-04-13 MED ORDER — NICOTINE 7 MG/24HR TD PT24
7.0000 mg | MEDICATED_PATCH | Freq: Every day | TRANSDERMAL | Status: AC
  Administered 2022-04-13 – 2022-04-15 (×2): 7 mg via TRANSDERMAL
  Filled 2022-04-13 (×5): qty 1

## 2022-04-13 MED ORDER — ACETAMINOPHEN 500 MG PO TABS
1000.0000 mg | ORAL_TABLET | Freq: Four times a day (QID) | ORAL | Status: AC | PRN
  Administered 2022-04-13 – 2022-04-15 (×5): 1000 mg via ORAL
  Filled 2022-04-13 (×5): qty 2

## 2022-04-13 NOTE — ED Notes (Signed)
Pt stood behind room door and urinated on herself. Tech/sitter asked Pt if she wanted to get in shower, Pt refused and also refuse to take off urine soiled blankets that are wrapped around her.

## 2022-04-13 NOTE — ED Notes (Signed)
Patient denies pain and is resting comfortably.  

## 2022-04-13 NOTE — BH Assessment (Addendum)
@  2325, Clinician followed up with the referral made to [facility] earlier today. Per Jasmine December, patient accepted to [facility], pending a negative pregnancy results. The accepting provider is Dr. Althea Grimmer. Nurse report # # [phone]. Bed is ready now.   Molly Brook, RN, provided disposition updates.

## 2022-04-13 NOTE — ED Notes (Signed)
PT asking for pain med for her leg pain. This Clinical research associate requested PRN for pain EDP.

## 2022-04-13 NOTE — ED Notes (Signed)
IVC'd 04/11/22, exp. 04/18/22 IVC documents in purple zone

## 2022-04-13 NOTE — ED Notes (Signed)
Pt informed she can have Tylenol at 1500

## 2022-04-13 NOTE — Progress Notes (Signed)
Pt is under review at [facility]. 2nd shift to follow up.   Maryjean Ka, MSW, [facility] 04/13/2022 5:45 PM

## 2022-04-13 NOTE — ED Notes (Signed)
PT agitated refused blood draw

## 2022-04-13 NOTE — ED Notes (Signed)
Message sent for Pt requesting nicotine gum

## 2022-04-13 NOTE — ED Provider Notes (Signed)
Emergency Medicine Observation Re-evaluation Note  Molly [name] is a 36 y.o. female, seen on rounds today.  Pt initially presented to the ED for complaints of Medical Clearance Currently, the patient is resting comfortably.  Physical Exam  BP (!) 141/105 (BP Location: Left Wrist)   Pulse (!) 137   Temp 98 F (36.7 C)   Resp 18   SpO2 99%  Physical Exam General: NAD   ED Course / MDM  EKG:   I have reviewed the labs performed to date as well as medications administered while in observation.  Recent changes in the last 24 hours include patient exhibiting agitation that required Geodon administration.  Plan  Current plan is for inpatient psych placement.    Wynetta Fines, MD 04/13/22 [id number]

## 2022-04-13 NOTE — Progress Notes (Signed)
[facility] Psych ED Progress Note  04/13/2022 4:46 PM Molly [name]  MRN:  [id number]   Subjective:   Patient seen at [name], ED for face-to-face reevaluation.  She is sitting upright in bed and originally is willing to participate in assessment.  She speaks in a loud tone, pressured speech, appears irritable and agitated. She immediately begins telling me she needs to be tested for HIV, needs an Xray of both of her legs to ensure he knees and shins are not broken, wants a brain scan to confirm no brain cancer, and stated she wanted to be moved upstairs to be with her friend Molly [name]. When I asked her to elaborate she stated [name] has a secret psychiatric ward and her friend Molly [name] is there. I tried to clarify if she meant [facility] by [facility] but she was very certain there is one here hidden at [facility] and she is trying to get to her friend Molly [name]. She then starts talking about how she is disabled, her case worker is trying to set up housing, and tells me she doesn't need medicine.  She denies current suicidal or homicidal thoughts.  She denies auditory visual hallucinations.  Pt still appears to be psychotic, paranoid, and delusional.  However she does seem more organized than a few days ago according to documentation. Ordered Valproic Acid level to be drawn tonight prior to PM dose. Pt has been denied by [facility], HH, and OV for aggressive behavior and high acuity. Pt was referred to [facility] for review. CSW to continue to follow up and assist.   Principal Problem: <principal problem not specified> Diagnosis:  Active Problems:   Schizoaffective disorder (HCC)   Involuntary commitment   ED Assessment Time Calculation: Start Time: 1650 Stop Time: 1720 Total Time in Minutes (Assessment Completion): 30    [place] Scale:  Flowsheet Row ED from 04/10/2022 in [facility] EMERGENCY DEPARTMENT ED from 04/08/2022 in [facility] from 04/06/2022 in [facility]  C-SSRS RISK CATEGORY No Risk No Risk No Risk       Past Medical History:  Past Medical History:  Diagnosis Date   Abnormal Pap smear    ASC-cannot exclude HGSIL on Pap 02/15/2012   ASC-US on 02/03/2012 pap (associated Trichomonas infection). No reflex HPV testing performed on specimen.  Patient informed that she will need repeat Pap in one year.       Asthma    ATTENTION DEFICIT, W/O HYPERACTIVITY, History of 06/30/2006   Qualifier: History of  By: McDiarmid MD, Molly [name], GONOCOCCAL, History of 01/09/2007   Qualifier: History of  By: McDiarmid MD, Molly [name] ACUMINATA, HISTORY OF 05/12/2009   Qualifier: History of  By: McDiarmid MD, Molly [name]     Depression    Diabetes mellitus    diet controlled   Eczema    Hypertension    Overactive bladder    Schizophrenia (HCC)    SCHIZOPHRENIA, CATATONIC, HISTORY OF 12/13/2006   Annotation: Diagnoses by  Dr. Dennie [name] (Psych) At [facility] in  [place], [place]. 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   Qualifier: Diagnosis of  By: McDiarmid MD, Molly [name] USER 02/08/2009   Qualifier: Diagnosis of  By: Molly [name]      Past Surgical History:  Procedure Laterality Date   INCISION AND DRAINAGE  pilanodal cyst   TIBIA IM NAIL INSERTION Right 10/26/2021   Procedure: INTRAMEDULLARY (IM) NAIL TIBIAL;  Surgeon: Molly [name] [place], MD;  Location: [place];  Service: Orthopedics;  Laterality: Right;   TOOTH EXTRACTION N/A 10/07/2017   Procedure: EXTRACTION TEETH NUMBERS ONE, SEVENTEEN, NINETEEN AND THIRTY TWO;  Surgeon: Diona Browner, DDS;  Location: [place];  Service: Oral Surgery;  Laterality: N/A;   Family History:  Family History  Adopted: Yes  Problem Relation Age of Onset   Bipolar disorder Sister    Alcohol abuse Brother    Cancer Father    Diabetes Mother    Social History:  Social History   Substance and Sexual  Activity  Alcohol Use No   Comment: occ     Social History   Substance and Sexual Activity  Drug Use No    Social History   Socioeconomic History   Marital status: Single    Spouse name: Not on file   Number of children: Not on file   Years of education: Not on file   Highest education level: Not on file  Occupational History   Not on file  Tobacco Use   Smoking status: Every Day    Packs/day: 2.00    Years: 10.00    Total pack years: 20.00    Types: Cigarettes   Smokeless tobacco: Never  Vaping Use   Vaping Use: Never used  Substance and Sexual Activity   Alcohol use: No    Comment: occ   Drug use: No   Sexual activity: Yes    Birth control/protection: Injection  Other Topics Concern   Not on file  Social History Narrative   Adopted   Has Guardian   Living in [place] home with Molly [name]   Transportation: Bus   Social Determinants of Health   Financial Resource Strain: Not on file  Food Insecurity: Not on file  Transportation Needs: Not on file  Physical Activity: Not on file  Stress: Not on file  Social Connections: Not on file    Sleep: Fair  Appetite:  Good  Current Medications: Current Facility-Administered Medications  Medication Dose Route Frequency Provider Last Rate Last Admin   acetaminophen (TYLENOL) tablet 1,000 mg  1,000 mg Oral Q6H PRN Molly Merino, MD   1,000 mg at 04/13/22 1502   benztropine (COGENTIN) tablet 1 mg  1 mg Oral BID Molly Pick, MD   1 mg at 04/13/22 0850   divalproex (DEPAKOTE) DR tablet 500 mg  500 mg Oral BID Molly Pick, MD   500 mg at 04/13/22 [id number]   nicotine (NICODERM CQ - dosed in mg/24 hr) patch 7 mg  7 mg Transdermal Daily Molly Merino, MD   7 mg at 04/13/22 1541   OLANZapine (ZYPREXA) tablet 10 mg  10 mg Oral Daily Molly Pick, MD   10 mg at 04/13/22 [id number]   Current Outpatient Medications  Medication Sig Dispense Refill   benztropine (COGENTIN) 1 MG tablet Take 1 mg by mouth 2 (two) times daily.      divalproex (DEPAKOTE) 500 MG DR tablet Take 1 tablet (500 mg total) by mouth 2 (two) times daily for 7 days. (Patient taking differently: Take 1,000-1,500 mg by mouth See admin instructions. 1000 mg in the morning, 1500 mg in the evening) 14 tablet 0   haloperidol (HALDOL) 10 MG tablet Take 1 tablet (10 mg total) by mouth 2 (two) times daily. 60 tablet 0   lisinopril (ZESTRIL) 5 MG tablet Take 5  mg by mouth daily.     OLANZapine (ZYPREXA) 10 MG tablet Take 1 tablet (10 mg total) by mouth daily for 7 days. (Patient taking differently: Take 10 mg by mouth 2 (two) times daily.) 7 tablet 0   paliperidone (INVEGA SUSTENNA) 156 MG/ML SUSY injection Inject 156 mg into the muscle once. (Patient not taking: Reported on 04/13/2022)      Lab Results: No results found for this or any previous visit (from the past 48 hour(s)).  Blood Alcohol level:  Lab Results  Component Value Date   [facility] <10 04/01/2022   ETH <10 01/18/2022   Psychiatric Specialty Exam:  Presentation  General Appearance:  Fairly Groomed  Eye Contact: Good  Speech: Clear and Coherent  Speech Volume: Increased  Handedness: Right   Mood and Affect  Mood: Irritable  Affect: Labile   Thought Process  Thought Processes: Goal Directed  Descriptions of Associations:Circumstantial  Orientation:Full (Time, Place and Person)  Thought Content:Illogical; Paranoid Ideation  History of Schizophrenia/Schizoaffective disorder:Yes  Duration of Psychotic Symptoms:Greater than six months  Hallucinations:Hallucinations: None  Ideas of Reference:Delusions; Paranoia  Suicidal Thoughts:Suicidal Thoughts: No  Homicidal Thoughts:Homicidal Thoughts: No   Sensorium  Memory: Immediate Fair; Recent Fair  Judgment: Impaired  Insight: Lacking   Executive Functions  Concentration: Fair  Attention Span: Fair  Recall: [employer] of Knowledge: Fair  Language: Fair   Psychomotor Activity  Psychomotor  Activity: Psychomotor Activity: Normal   Assets  Assets: Physical Health; Resilience   Sleep  Sleep: Sleep: Fair    Physical Exam: Physical Exam Neurological:     Mental Status: She is alert and oriented to person, place, and time.  Psychiatric:        Attention and Perception: Attention normal.        Mood and Affect: Affect is labile and angry.        Speech: Speech is rapid and pressured.        Behavior: Behavior is agitated.        Thought Content: Thought content is paranoid and delusional.        Judgment: Judgment is impulsive.    Review of Systems  Psychiatric/Behavioral:         Irritable, delusional, paranoid  All other systems reviewed and are negative.  Blood pressure (!) 133/96, pulse (!) 137, temperature 98.5 F (36.9 C), temperature source Oral, resp. rate 20, SpO2 98 %, unknown if currently breastfeeding. There is no height or weight on file to calculate BMI.   Medical Decision Making: Pt case reviewed and discussed with Dr. Leverne Humbles. Will continue to recommend inpatient psychiatric treatment. CSW has sent referral to [facility], waiting for review.  - Valproic Acid Level ordered for tonight prior to PM dose  Vesta Mixer, NP 04/13/2022, 4:46 PM

## 2022-04-13 NOTE — ED Notes (Signed)
Pt agitated shouted at staff after shower. Pt threw her wet cloths in hall.

## 2022-04-13 NOTE — Progress Notes (Signed)
Pt was denied by [facility], [facility], and [facility] due to aggressive behavior and high acuity. CSW completed Regional Referral Form for [facility] and sent referral to [facility] for review. CSW will continue to assist and follow with placement.  Cathie Beams, Theresia Majors  04/13/2022 3:27 PM

## 2022-04-14 DIAGNOSIS — F250 Schizoaffective disorder, bipolar type: Secondary | ICD-10-CM

## 2022-04-14 DIAGNOSIS — F259 Schizoaffective disorder, unspecified: Secondary | ICD-10-CM | POA: Diagnosis not present

## 2022-04-14 DIAGNOSIS — R443 Hallucinations, unspecified: Secondary | ICD-10-CM | POA: Insufficient documentation

## 2022-04-14 DIAGNOSIS — Z046 Encounter for general psychiatric examination, requested by authority: Secondary | ICD-10-CM

## 2022-04-14 LAB — RESP PANEL BY RT-PCR (FLU A&B, COVID) ARPGX2
Influenza A by PCR: NEGATIVE
Influenza B by PCR: NEGATIVE
SARS Coronavirus 2 by RT PCR: NEGATIVE

## 2022-04-14 LAB — HCG, QUANTITATIVE, PREGNANCY: hCG, Beta Chain, Quant, S: 1 m[IU]/mL (ref <5)

## 2022-04-14 LAB — PREGNANCY, URINE: Preg Test, Ur: NEGATIVE

## 2022-04-14 NOTE — ED Notes (Signed)
Pt taking a shower 

## 2022-04-14 NOTE — Progress Notes (Addendum)
[facility] Psych ED Progress Note  04/14/2022 2:11 PM Brandi Tomlinson  MRN:  [id number]   Subjective:  "I've been locked outside for two years" Principal Problem: Schizoaffective disorder (HCC) Diagnosis:  Principal Problem:   Schizoaffective disorder (HCC) Active Problems:   Involuntary commitment   Hallucinations   ED Assessment Time Calculation: Start Time: 1200 Stop Time: 1220 Total Time in Minutes (Assessment Completion): 20   Biagio Borg, 36 year old, female, with a history of Schizophrenia, re-evaluated at [facility] face to face, per TTS consult. Patient presented to the [facility] on 04/10/22 requesting psychiatric medication refills, and subsequently returned 04/12/2022 endorsing active hallucinations, responding to internal stimuli and initially appeared disheveled and smelled of urine. Patient was evaluated by TTS provider and inpatient psychiatric treatment was recommended . Patient was restarted Zyprexa, Cogentin, and Depakote. She did have an episode of agitation on 12/10 and 12/11 which resolved with agitation medication. Patient continues to display poor insight related to her mental illness.  Galileah states that this provider, I need you to find me under social worker through DSS because I have not seen the social worker since they [place] outside 2 years ago".  Patient reports that she has a DSS caseworker assigned to her.  On review of chart patient has a DSS legal guardian,  [name] ([employer]) Office: [phone], Cell: [phone].  Patient subsequently asked me if she could take her Depakote 4 times a day when asked why she tells this writer "because I want to change how I take the medicine".  Patient denies SI, HI, AH,and VH.  During evaluation Aryanne Gilleland is sitting on the bed without acute distress.  She is alert, oriented x 4, calm, cooperative and attentive. Her mood is liable (anxious and irritable, euthymic) with a blunted affect. She has normal, non-pressured speech, and behavior  is paranoid (frequently surveying the room).  Objectively, patient continues to to have delusional and disorganized thinking, today her DSS social worker is subject of her delusion and paranoia in thinking he locked her out of her home 2 years years ago. Patient lacks logic in her ability to discuss medication management.  However patient does not appear to be responding to internal stimuli.  Patient continues to pose a threat to herself and others due to disorganized thinking.  Patient is currently under involuntary commitment petition.  Patient is unable to reliably contract for safety.  Patient continues to meet inpatient psychiatric treatment criteria.  Patient is currently under review at [facility].    [place] Scale:  Flowsheet Row ED from 04/10/2022 in [facility] EMERGENCY DEPARTMENT ED from 04/08/2022 in [facility] from 04/06/2022 in [facility]  C-SSRS RISK CATEGORY No Risk No Risk No Risk       Past Medical History:  Past Medical History:  Diagnosis Date   Abnormal Pap smear    ASC-cannot exclude HGSIL on Pap 02/15/2012   ASC-US on 02/03/2012 pap (associated Trichomonas infection). No reflex HPV testing performed on specimen.  Patient informed that she will need repeat Pap in one year.       Asthma    ATTENTION DEFICIT, W/O HYPERACTIVITY, History of 06/30/2006   Qualifier: History of  By: McDiarmid MD, Jae Dire, GONOCOCCAL, History of 01/09/2007   Qualifier: History of  By: McDiarmid MD, Charlann Boxer ACUMINATA, HISTORY OF 05/12/2009   Qualifier: History of  By: McDiarmid MD, Tawanna Cooler  Depression    Diabetes mellitus    diet controlled   Eczema    Hypertension    Overactive bladder    Schizophrenia (HCC)    SCHIZOPHRENIA, CATATONIC, HISTORY OF 12/13/2006   Annotation: Diagnoses by  Dr. Dennie Bible (Psych) At [facility] in  [place], [place]. 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   Qualifier: Diagnosis of  By: McDiarmid MD, Benjaman Pott USER 02/08/2009   Qualifier: Diagnosis of  By: Knox Royalty      Past Surgical History:  Procedure Laterality Date   INCISION AND DRAINAGE     pilanodal cyst   TIBIA IM NAIL INSERTION Right 10/26/2021   Procedure: INTRAMEDULLARY (IM) NAIL TIBIAL;  Surgeon: Myrene Galas, MD;  Location: [facility] OR;  Service: Orthopedics;  Laterality: Right;   TOOTH EXTRACTION N/A 10/07/2017   Procedure: EXTRACTION TEETH NUMBERS ONE, SEVENTEEN, NINETEEN AND THIRTY TWO;  Surgeon: Ocie Doyne, DDS;  Location: [facility] OR;  Service: Oral Surgery;  Laterality: N/A;   Family History:  Family History  Adopted: Yes  Problem Relation Age of Onset   Bipolar disorder Sister    Alcohol abuse Brother    Cancer Father    Diabetes Mother     Social History:  Social History   Substance and Sexual Activity  Alcohol Use No   Comment: occ     Social History   Substance and Sexual Activity  Drug Use No    Social History   Socioeconomic History   Marital status: Single    Spouse name: Not on file   Number of children: Not on file   Years of education: Not on file   Highest education level: Not on file  Occupational History   Not on file  Tobacco Use   Smoking status: Every Day    Packs/day: 2.00    Years: 10.00    Total pack years: 20.00    Types: Cigarettes   Smokeless tobacco: Never  Vaping Use   Vaping Use: Never used  Substance and Sexual Activity   Alcohol use: No    Comment: occ   Drug use: No   Sexual activity: Yes    Birth control/protection: Injection  Other Topics Concern   Not on file  Social History Narrative   Adopted   Has Guardian   Living in [place] home with Bradly Chris   Transportation: Bus   Social Determinants of Health   Financial Resource Strain: Not on file  Food Insecurity: Not on file  Transportation Needs: Not on file  Physical  Activity: Not on file  Stress: Not on file  Social Connections: Not on file    Sleep: Good  Appetite:  Good  Current Medications: Current Facility-Administered Medications  Medication Dose Route Frequency Provider Last Rate Last Admin   acetaminophen (TYLENOL) tablet 1,000 mg  1,000 mg Oral Q6H PRN Wynetta Fines, MD   1,000 mg at 04/13/22 2151   benztropine (COGENTIN) tablet 1 mg  1 mg Oral BID Gloris Manchester, MD   1 mg at 04/14/22 1046   divalproex (DEPAKOTE) DR tablet 500 mg  500 mg Oral BID Gloris Manchester, MD   500 mg at 04/14/22 1046   nicotine (NICODERM CQ - dosed in mg/24 hr) patch 7 mg  7 mg Transdermal Daily Wynetta Fines, MD   7 mg at 04/13/22 1541   OLANZapine (ZYPREXA) tablet 10 mg  10  mg Oral Daily Gloris Manchester, MD   10 mg at 04/14/22 1047   Current Outpatient Medications  Medication Sig Dispense Refill   benztropine (COGENTIN) 1 MG tablet Take 1 mg by mouth 2 (two) times daily.     divalproex (DEPAKOTE) 500 MG DR tablet Take 1 tablet (500 mg total) by mouth 2 (two) times daily for 7 days. (Patient taking differently: Take 1,000-1,500 mg by mouth See admin instructions. 1000 mg in the morning, 1500 mg in the evening) 14 tablet 0   haloperidol (HALDOL) 10 MG tablet Take 1 tablet (10 mg total) by mouth 2 (two) times daily. 60 tablet 0   lisinopril (ZESTRIL) 5 MG tablet Take 5 mg by mouth daily.     OLANZapine (ZYPREXA) 10 MG tablet Take 1 tablet (10 mg total) by mouth daily for 7 days. (Patient taking differently: Take 10 mg by mouth 2 (two) times daily.) 7 tablet 0   paliperidone (INVEGA SUSTENNA) 156 MG/ML SUSY injection Inject 156 mg into the muscle once. (Patient not taking: Reported on 04/13/2022)      Lab Results:  Results for orders placed or performed during the hospital encounter of 04/10/22 (from the past 48 hour(s))  hCG, quantitative, pregnancy     Status: None   Collection Time: 04/13/22  9:45 PM  Result Value Ref Range   hCG, Beta Chain, Quant, S 1 <5 mIU/mL     Comment:          GEST. AGE      CONC.  (mIU/mL)   <=1 WEEK        5 - 50     2 WEEKS       50 - 500     3 WEEKS       100 - 10,000     4 WEEKS     1,000 - 30,000     5 WEEKS     3,500 - 115,000   6-8 WEEKS     12,000 - 270,000    12 WEEKS     15,000 - 220,000        FEMALE AND NON-PREGNANT FEMALE:     LESS THAN 5 mIU/mL Performed at [facility], [address] [place], [place] [address]   Valproic acid level     Status: Abnormal   Collection Time: 04/13/22 10:00 PM  Result Value Ref Range   Valproic Acid Lvl 47 (L) 50.0 - 100.0 ug/mL    Comment: Performed at [facility], [address] [place], [place] [address]  Resp Panel by RT-PCR (Flu A&B, Covid) Anterior Nasal Swab     Status: None   Collection Time: 04/14/22  9:33 AM   Specimen: Anterior Nasal Swab  Result Value Ref Range   SARS Coronavirus 2 by RT PCR NEGATIVE NEGATIVE    Comment: (NOTE) SARS-CoV-2 target nucleic acids are NOT DETECTED.  The SARS-CoV-2 RNA is generally detectable in upper respiratory specimens during the acute phase of infection. The lowest concentration of SARS-CoV-2 viral copies this assay can detect is 138 copies/mL. A negative result does not preclude SARS-Cov-2 infection and should not be used as the sole basis for treatment or other patient management decisions. A negative result may occur with  improper specimen collection/handling, submission of specimen other than nasopharyngeal swab, presence of viral mutation(s) within the areas targeted by this assay, and inadequate number of viral copies(<138 copies/mL). A negative result must be combined with clinical observations, patient history, and epidemiological  information. The expected result is Negative.  Fact Sheet for Patients:  BloggerCourse.comhttps://www.fda.gov/media/152166/download  Fact Sheet for Healthcare Providers:  SeriousBroker.ithttps://www.fda.gov/media/152162/download  This test is no t yet approved or cleared by the [place] FDA and   has been authorized for detection and/or diagnosis of SARS-CoV-2 by FDA under an Emergency Use Authorization (EUA). This EUA will remain  in effect (meaning this test can be used) for the duration of the COVID-19 declaration under Section 564(b)(1) of the Act, 21 U.S.C.section 360bbb-3(b)(1), unless the authorization is terminated  or revoked sooner.       Influenza A by PCR NEGATIVE NEGATIVE   Influenza B by PCR NEGATIVE NEGATIVE    Comment: (NOTE) The Xpert Xpress SARS-CoV-2/FLU/RSV plus assay is intended as an aid in the diagnosis of influenza from Nasopharyngeal swab specimens and should not be used as a sole basis for treatment. Nasal washings and aspirates are unacceptable for Xpert Xpress SARS-CoV-2/FLU/RSV testing.  Fact Sheet for Patients: BloggerCourse.comhttps://www.fda.gov/media/152166/download  Fact Sheet for Healthcare Providers: SeriousBroker.ithttps://www.fda.gov/media/152162/download  This test is not yet approved or cleared by the [place] FDA and has been authorized for detection and/or diagnosis of SARS-CoV-2 by FDA under an Emergency Use Authorization (EUA). This EUA will remain in effect (meaning this test can be used) for the duration of the COVID-19 declaration under Section 564(b)(1) of the Act, 21 U.S.C. section 360bbb-3(b)(1), unless the authorization is terminated or revoked.  Performed at [facility], [address] [place], [place] [address]     Blood Alcohol level:  Lab Results  Component Value Date   [facility] 04/[facility]   ETH <10 01/18/2022    Physical Findings:   Musculoskeletal: Strength & Muscle Tone: within normal limits Gait & Station: normal Patient leans: N/A  Psychiatric Specialty Exam:  Presentation  General Appearance:  Fairly Groomed  Eye Contact: Good  Speech: Clear and Coherent  Speech Volume: Increased  Handedness: Right   Mood and Affect  Mood: Irritable  Affect: Labile   Thought Process  Thought  Processes: Goal Directed  Descriptions of Associations:Circumstantial  Orientation:Full (Time, Place and Person)  Thought Content:Illogical; Paranoid Ideation  History of Schizophrenia/Schizoaffective disorder:Yes  Duration of Psychotic Symptoms:Greater than six months  Hallucinations:Hallucinations: None  Ideas of Reference:Delusions; Paranoia  Suicidal Thoughts:Suicidal Thoughts: No  Homicidal Thoughts:Homicidal Thoughts: No   Sensorium  Memory: Immediate Fair; Recent Fair  Judgment: Impaired  Insight: Lacking   Executive Functions  Concentration: Fair  Attention Span: Fair  Recall: [employer]  Fund of Knowledge: Fair  Language: Fair   Psychomotor Activity  Psychomotor Activity: Psychomotor Activity: Normal   Assets  Assets: Physical Health; Resilience   Sleep  Sleep: Sleep: Fair    Physical Exam: Physical Exam Vitals reviewed.  HENT:     Head: Normocephalic and atraumatic.  Eyes:     Extraocular Movements: Extraocular movements intact.     Pupils: Pupils are equal, round, and reactive to light.  Cardiovascular:     Rate and Rhythm: Normal rate and regular rhythm.  Pulmonary:     Effort: Pulmonary effort is normal.     Breath sounds: Normal breath sounds.  Musculoskeletal:     Cervical back: Normal range of motion.  Skin:    General: Skin is warm.     Capillary Refill: Capillary refill takes less than 2 seconds.  Neurological:     General: No focal deficit present.     Mental Status: She is alert.    Review of Systems  Psychiatric/Behavioral:  Positive  for hallucinations.    Blood pressure 110/84, pulse 94, temperature 97.9 F (36.6 C), temperature source Oral, resp. rate 20, SpO2 95 %, unknown if currently breastfeeding. There is no height or weight on file to calculate BMI.   Medical Decision Making: Patient case review and discussed with Dr. Viviano Simas, patient continues to meet criteria for inpatient psychiatric treatment.  Patient is unable to reliably contract for safety at this time. There is currently no appropriate bed  availability at [facility]. CSW notified patient is under review at [facility].  Joaquin Courts, FNP-C, PMHNP-BC 04/14/2022, 2:11 PM

## 2022-04-14 NOTE — ED Notes (Signed)
Patient denies pain and is resting comfortably.  

## 2022-04-14 NOTE — ED Notes (Signed)
RN introduced self and attempted to perform assessment on patient. Pt initially complied but then refused after she was told that she could not keep her pulse ox probe on her finger. Pt requested to have her door closed.Door is closed with glass blinders open.

## 2022-04-14 NOTE — ED Notes (Signed)
Attempted to call [facility] to give report NO answer at # given for report.

## 2022-04-14 NOTE — ED Notes (Signed)
PT declined at [facility] .

## 2022-04-14 NOTE — Progress Notes (Signed)
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 Morrie [name] and [facility] apologized for confusion with this referral.  CSW updated the care team: Celine Ahr, Joaquin Courts, FNP. Cathie Beams, LCSWA   CSW called [facility] and completed verbal with DeAnna with Intake. DeAnna informed that pt will be reviewed for the waitlist. CSW and Disposition team will assist and follow with placement.   Maryjean Ka, MSW, LCSWA 04/14/2022 4:01 PM

## 2022-04-14 NOTE — ED Notes (Signed)
Pt's room was cleaned in AM. Pt asking for cleaner to come back and clean her room again. . Pt reports trash on her floor. Pt instructed to pick up trash and put trash in trash can.

## 2022-04-14 NOTE — ED Notes (Signed)
Pt. Contact with [employer] made a face to face visit with Patient.

## 2022-04-14 NOTE — ED Notes (Signed)
This Clinical research associate requested Pt to collect a urine sample. Pt reported not now but a cup can be left so I can get a sample.

## 2022-04-14 NOTE — Progress Notes (Signed)
CSW spoke with the Intake RN at [facility] who has requested additional labs for this patient. Intake RN has confirmed that the patient is under review. As of @2341 , CSW sent a listing of available labs via email and has requested the ED RN to get additional labs. AM CSW to follow-up.    , MSW, [name]-A  11:42 PM 04/14/2022

## 2022-04-14 NOTE — Progress Notes (Signed)
CSW spoke with Morrie [name], Charity fundraiser, in intake at [facility] who advised that pt was not fully accepted yesterday due to pending items; pregnancy test.   CSW informed and provided information about specific bed assignment given to Melynda Ripple, Counselor, yesterday during second shift:  @2325 , Clinician followed up with the referral made to [facility] earlier today. Per [facility], patient accepted to [facility], pending a negative pregnancy results. The accepting provider is Dr. [facility]. Nurse report # # [phone]. Bed is ready now.    [phone], RN, provided disposition updates.   CSW is awaiting response from [facility]. CSW will update treatment team.  [facility], Cathie Beams  04/14/2022 2:37 PM

## 2022-04-14 NOTE — ED Notes (Signed)
PT took meds and thru meds on floor

## 2022-04-14 NOTE — ED Provider Notes (Signed)
Emergency Medicine Observation Re-evaluation Note  Letina Luckett is a 36 y.o. female, seen on rounds today.  Pt initially presented to the ED for complaints of bizarre behavior. Currently, the patient is sleeping.  Physical Exam  BP (!) 133/96 (BP Location: Left Arm)   Pulse (!) 137   Temp 98.5 F (36.9 C) (Oral)   Resp 20   SpO2 98%  Physical Exam General: No distress, sleeping Cardiac: Regular rate and rhythm Lungs: No increased work of breathing Psych: Sleeping, calm, no report of agitation this morning   ED Course / MDM  EKG:   I have reviewed the labs performed to date as well as medications administered while in observation.  Recent changes in the last 24 hours include none.  Plan  Current plan is for placement.    Gerhard Munch, MD 04/14/22 [phone]

## 2022-04-14 NOTE — ED Notes (Signed)
Molly [name] ([id number]) from the [facility] Department of Social Services called for update on patient and was informed patient is still waiting on placement.

## 2022-04-14 NOTE — ED Notes (Signed)
Attempted to call report to [facility] at [phone]. This Clinical research associate was informed Pt has been declined . This Clinical research associate was transferred to a voice mail and left  call back # and Name.

## 2022-04-15 DIAGNOSIS — F259 Schizoaffective disorder, unspecified: Secondary | ICD-10-CM | POA: Diagnosis not present

## 2022-04-15 MED ORDER — DIVALPROEX SODIUM 500 MG PO DR TAB
500.0000 mg | DELAYED_RELEASE_TABLET | ORAL | Status: DC
  Administered 2022-04-16 – 2022-04-20 (×5): 500 mg via ORAL
  Filled 2022-04-15 (×5): qty 1

## 2022-04-15 MED ORDER — LORAZEPAM 2 MG/ML IJ SOLN: 2.0000 mg | Freq: Four times a day (QID) | INTRAMUSCULAR | Status: DC | PRN

## 2022-04-15 MED ORDER — LORAZEPAM 1 MG PO TABS
1.0000 mg | ORAL_TABLET | Freq: Four times a day (QID) | ORAL | Status: DC | PRN
  Administered 2022-04-15 – 2022-04-18 (×2): 1 mg via ORAL
  Filled 2022-04-15 (×2): qty 1

## 2022-04-15 MED ORDER — DIVALPROEX SODIUM 500 MG PO DR TAB
1000.0000 mg | DELAYED_RELEASE_TABLET | Freq: Every day | ORAL | Status: DC
  Administered 2022-04-15 – 2022-04-19 (×5): 1000 mg via ORAL
  Filled 2022-04-15 (×5): qty 2

## 2022-04-15 MED ORDER — NICOTINE POLACRILEX 2 MG MT GUM
2.0000 mg | CHEWING_GUM | OROMUCOSAL | Status: DC | PRN
  Administered 2022-04-16 – 2022-05-09 (×47): 2 mg via ORAL
  Filled 2022-04-15 (×45): qty 1

## 2022-04-15 NOTE — ED Notes (Addendum)
Pt requested a coffee, writer told pt it would be her snack for the morning. Pt compliant with this

## 2022-04-15 NOTE — ED Notes (Signed)
Patient asked for med to help her sleep: PRN administered-Monique,RN

## 2022-04-15 NOTE — ED Notes (Signed)
Psych talking to pt.

## 2022-04-15 NOTE — ED Notes (Signed)
Pt crying and moaning loudly.

## 2022-04-15 NOTE — ED Notes (Signed)
Pt refusing blood draw. Pt educated on importance of bloodwork for placement at facility, and pt states, "I don't care. Get the fuck outta my room!." NP updated on pt response.

## 2022-04-15 NOTE — ED Notes (Signed)
Pt's lunch has arrived, pt currently taking a nap. Writer left lunch tray at nurse's station

## 2022-04-15 NOTE — ED Notes (Signed)
After speaking to psychiatry, pt began to hysterically cry. Writer asked pt if she would like to have her door closed due to the pt hollering while crying. Pt yelled out "just leave me alone". After a minute, the pt stood up and asked to use the phone. RN dialed and gave phone to pt

## 2022-04-15 NOTE — ED Notes (Signed)
Pt awoken from her sleep, pt walked to the BR w/o any difficulty. Writer warmed up her breakfast, breakfast given to pt. Writer asked pt if we could take her vitals, pt did not want her vitals taken. RN made aware.

## 2022-04-15 NOTE — ED Notes (Signed)
Pt's door closed due to another pt being slightly loud and disruptive. Pt is currently taking a nap. Pt can be seen by this Clinical research associate through the window.

## 2022-04-15 NOTE — ED Provider Notes (Signed)
Emergency Medicine Observation Re-evaluation Note  Molly [name] is a 36 y.o. female, seen on rounds today.  Pt initially presented to the ED for complaints of Medical Clearance Currently, the patient is sitting in bed. Complaining about hallucinations.  Physical Exam  BP (!) 141/84 (BP Location: Right Arm)   Pulse (!) 109   Temp 99.2 F (37.3 C) (Oral)   Resp 18   SpO2 99%  Physical Exam General: no acute distress Cardiac: RRR Lungs: normal effort Psych: no agitation  ED Course / MDM  EKG:   I have reviewed the labs performed to date as well as medications administered while in observation.  Recent changes in the last 24 hours include nicotine gum ordered this morning, as per her request.  Plan  Current plan is for inpatient psychiatric admission.    Pricilla Loveless, MD 04/15/22 1016

## 2022-04-15 NOTE — ED Notes (Signed)
Pt's breakfast has arrived, pt currently sleeping. Writer left pt's breakfast tray out on the nurse's station.

## 2022-04-15 NOTE — ED Notes (Signed)
Pt's dinner has arrived, pt still taking a nap. Writer left pt's dinner at nurse's station

## 2022-04-15 NOTE — ED Notes (Signed)
Pt is noted to be sleeping at foot of bed. Chest rise and fall noted. Sitter outside room observing patient. No S/S of distress noted.

## 2022-04-15 NOTE — Progress Notes (Signed)
[facility] ED Progress Note  04/15/2022 11:57 AM Molly [name]  MRN:  [id number]   Subjective:   Principal Problem: Schizoaffective disorder (HCC) Diagnosis:  Principal Problem:   Schizoaffective disorder (HCC) Active Problems:   Involuntary commitment   Hallucinations   ED Assessment Time Calculation: Start Time: 1020 Stop Time: 1040 Total Time in Minutes (Assessment Completion): 20  Molly [name] is a 36 year old female, seen for reevaluation, per TTS psychiatric consult.  Patient presented to the ED on 04/10/2022 complaining of hallucinations.  Patient subsequently developed symptoms concerning of acute psychosis with decompensation of schizophrenia.  Patient was noted to be disheveled and responding to internal stimuli during her initial psychiatric exam.  Patient was seen by this writer yesterday, and was noted to be responding to internal stimuli.  Patient was recommended for inpatient psychiatric treatment however given her history of IDD therefore has been denied at several inpatient psychiatric facilities.  Social work is currently working with [facility] to secure placement in their inpatient facility. On review of chart patient has a DSS legal guardian,  [name] ([employer]) Office: [phone], Cell: [phone].    Patient was restarted Zyprexa, Cogentin, and Depakote. She did have an episode of agitation on 12/10 and 12/11 which resolved with agitation medication.  On evaluation today, patient is visibly agitated and labile.  Patient questions this Clinical research associate and begins to cry, "Why are yall not taking care of me and giving me my medicines".  Patient was reminded she is receiving her medications as prescribed and had recently taken her morning medications prior to this writer arriving on the unit.  Patient continues to be disorganized and is continuing as she did yesterday to request a new DSS social worker and is on redirectable when this Clinical research associate explains the role of TTS psychiatry.  Patient  became more agitated with this Clinical research associate and evaluation ended.  On chart review patient had a Depakote level drawn 2 days ago, Depakote level subtherapeutic at 47.  On chart review patient had previously taking Depakote 3 times a day opposed to 2 times a day as currently ordered.  Will adjust medications to Depakote 500 mg a.m. and increase Depakote bedtime to 1000 mg and repeat valproic acid level in 48 hours.   Objectively patient continues to exhibit behaviors and symptoms consistent with decompensation of schizophrenia with delusional thinking and paranoia.  Patient continues to pose a risk for safety and continues to meet inpatient psychiatric treatment criteria.   [place] Scale:  Flowsheet Row ED from 04/10/2022 in [facility] EMERGENCY DEPARTMENT ED from 04/08/2022 in [facility] from 04/06/2022 in [facility]  C-SSRS RISK CATEGORY No Risk No Risk No Risk       Past Medical History:  Past Medical History:  Diagnosis Date   Abnormal Pap smear    ASC-cannot exclude HGSIL on Pap 02/15/2012   ASC-US on 02/03/2012 pap (associated Trichomonas infection). No reflex HPV testing performed on specimen.  Patient informed that she will need repeat Pap in one year.       Asthma    ATTENTION DEFICIT, W/O HYPERACTIVITY, History of 06/30/2006   Qualifier: History of  By: McDiarmid MD, Jae Dire, GONOCOCCAL, History of 01/09/2007   Qualifier: History of  By: McDiarmid MD, Charlann Boxer ACUMINATA, HISTORY OF 05/12/2009   Qualifier: History of  By: McDiarmid MD, Todd     Depression    Diabetes mellitus  diet controlled   Eczema    Hypertension    Overactive bladder    Schizophrenia (HCC)    SCHIZOPHRENIA, CATATONIC, HISTORY OF 12/13/2006   Annotation: Diagnoses by  Dr. Dennie Bible (Psych) At [facility] in  [place], [place]. 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   Qualifier: Diagnosis of  By: McDiarmid MD, Benjaman Pott USER 02/08/2009   Qualifier: Diagnosis of  By: Knox Royalty      Past Surgical History:  Procedure Laterality Date   INCISION AND DRAINAGE     pilanodal cyst   TIBIA IM NAIL INSERTION Right 10/26/2021   Procedure: INTRAMEDULLARY (IM) NAIL TIBIAL;  Surgeon: Myrene Galas, MD;  Location: [facility] OR;  Service: Orthopedics;  Laterality: Right;   TOOTH EXTRACTION N/A 10/07/2017   Procedure: EXTRACTION TEETH NUMBERS ONE, SEVENTEEN, NINETEEN AND THIRTY TWO;  Surgeon: Ocie Doyne, DDS;  Location: [facility] OR;  Service: Oral Surgery;  Laterality: N/A;   Family History:  Family History  Adopted: Yes  Problem Relation Age of Onset   Bipolar disorder Sister    Alcohol abuse Brother    Cancer Father    Diabetes Mother     Social History:  Social History   Substance and Sexual Activity  Alcohol Use No   Comment: occ     Social History   Substance and Sexual Activity  Drug Use No    Social History   Socioeconomic History   Marital status: Single    Spouse name: Not on file   Number of children: Not on file   Years of education: Not on file   Highest education level: Not on file  Occupational History   Not on file  Tobacco Use   Smoking status: Every Day    Packs/day: 2.00    Years: 10.00    Total pack years: 20.00    Types: Cigarettes   Smokeless tobacco: Never  Vaping Use   Vaping Use: Never used  Substance and Sexual Activity   Alcohol use: No    Comment: occ   Drug use: No   Sexual activity: Yes    Birth control/protection: Injection  Other Topics Concern   Not on file  Social History Narrative   Adopted   Has Guardian   Living in [place] home with Bradly Chris   Transportation: Bus   Social Determinants of Health   Financial Resource Strain: Not on file  Food Insecurity: Not on file  Transportation Needs: Not on file  Physical Activity: Not on file  Stress: Not  on file  Social Connections: Not on file    Sleep: Good  Appetite:  Good  Current Medications: Current Facility-Administered Medications  Medication Dose Route Frequency Provider Last Rate Last Admin   acetaminophen (TYLENOL) tablet 1,000 mg  1,000 mg Oral Q6H PRN Wynetta Fines, MD   1,000 mg at 04/15/22 0002   benztropine (COGENTIN) tablet 1 mg  1 mg Oral BID Gloris Manchester, MD   1 mg at 04/15/22 0901   divalproex (DEPAKOTE) DR tablet 1,000 mg  1,000 mg Oral QHS Bing Neighbors, FNP       [START ON 04/16/2022] divalproex (DEPAKOTE) DR tablet 500 mg  500 mg Oral Nanda Quinton, FNP       LORazepam (ATIVAN) tablet 1 mg  1 mg Oral Q6H PRN Bing Neighbors, FNP       Or  LORazepam (ATIVAN) injection 2 mg  2 mg Intramuscular Q6H PRN Bing Neighbors, FNP       nicotine (NICODERM CQ - dosed in mg/24 hr) patch 7 mg  7 mg Transdermal Daily Wynetta Fines, MD   7 mg at 04/15/22 3845   nicotine polacrilex (NICORETTE) gum 2 mg  2 mg Oral PRN Pricilla Loveless, MD       OLANZapine [facility]) tablet 10 mg  10 mg Oral Daily Gloris Manchester, MD   10 mg at 04/15/22 0902   Current Outpatient Medications  Medication Sig Dispense Refill   benztropine (COGENTIN) 1 MG tablet Take 1 mg by mouth 2 (two) times daily.     divalproex (DEPAKOTE) 500 MG DR tablet Take 1 tablet (500 mg total) by mouth 2 (two) times daily for 7 days. (Patient taking differently: Take 1,000-1,500 mg by mouth See admin instructions. 1000 mg in the morning, 1500 mg in the evening) 14 tablet 0   haloperidol (HALDOL) 10 MG tablet Take 1 tablet (10 mg total) by mouth 2 (two) times daily. 60 tablet 0   lisinopril (ZESTRIL) 5 MG tablet Take 5 mg by mouth daily.     OLANZapine (ZYPREXA) 10 MG tablet Take 1 tablet (10 mg total) by mouth daily for 7 days. (Patient taking differently: Take 10 mg by mouth 2 (two) times daily.) 7 tablet 0   paliperidone (INVEGA SUSTENNA) 156 MG/ML SUSY injection Inject 156 mg into the muscle once.  (Patient not taking: Reported on 04/13/2022)      Lab Results:  Results for orders placed or performed during the hospital encounter of 04/10/22 (from the past 48 hour(s))  hCG, quantitative, pregnancy     Status: None   Collection Time: 04/13/22  9:45 PM  Result Value Ref Range   hCG, Beta Chain, Quant, S 1 <5 mIU/mL    Comment:          GEST. AGE      CONC.  (mIU/mL)   <=1 WEEK        5 - 50     2 WEEKS       50 - 500     3 WEEKS       100 - 10,000     4 WEEKS     1,000 - 30,000     5 WEEKS     3,500 - 115,000   6-8 WEEKS     12,000 - 270,000    12 WEEKS     15,000 - 220,000        FEMALE AND NON-PREGNANT FEMALE:     LESS THAN 5 mIU/mL Performed at [facility], [address] [place], [place] [address]   Valproic acid level     Status: Abnormal   Collection Time: 04/13/22 10:00 PM  Result Value Ref Range   Valproic Acid Lvl 47 (L) 50.0 - 100.0 ug/mL    Comment: Performed at [facility], [address] [place], [place] [address]  Resp Panel by RT-PCR (Flu A&B, Covid) Anterior Nasal Swab     Status: None   Collection Time: 04/14/22  9:33 AM   Specimen: Anterior Nasal Swab  Result Value Ref Range   SARS Coronavirus 2 by RT PCR NEGATIVE NEGATIVE    Comment: (NOTE) SARS-CoV-2 target nucleic acids are NOT DETECTED.  The SARS-CoV-2 RNA is generally detectable in upper respiratory specimens during the acute phase of infection. The lowest concentration of SARS-CoV-2 viral copies this assay  can detect is 138 copies/mL. A negative result does not preclude SARS-Cov-2 infection and should not be used as the sole basis for treatment or other patient management decisions. A negative result may occur with  improper specimen collection/handling, submission of specimen other than nasopharyngeal swab, presence of viral mutation(s) within the areas targeted by this assay, and inadequate number of viral copies(<138 copies/mL). A negative result must be combined with clinical  observations, patient history, and epidemiological information. The expected result is Negative.  Fact Sheet for Patients:  BloggerCourse.comhttps://www.fda.gov/media/152166/download  Fact Sheet for Healthcare Providers:  SeriousBroker.ithttps://www.fda.gov/media/152162/download  This test is no t yet approved or cleared by the [place] FDA and  has been authorized for detection and/or diagnosis of SARS-CoV-2 by FDA under an Emergency Use Authorization (EUA). This EUA will remain  in effect (meaning this test can be used) for the duration of the COVID-19 declaration under Section 564(b)(1) of the Act, 21 U.S.C.section 360bbb-3(b)(1), unless the authorization is terminated  or revoked sooner.       Influenza A by PCR NEGATIVE NEGATIVE   Influenza B by PCR NEGATIVE NEGATIVE    Comment: (NOTE) The Xpert Xpress SARS-CoV-2/FLU/RSV plus assay is intended as an aid in the diagnosis of influenza from Nasopharyngeal swab specimens and should not be used as a sole basis for treatment. Nasal washings and aspirates are unacceptable for Xpert Xpress SARS-CoV-2/FLU/RSV testing.  Fact Sheet for Patients: BloggerCourse.comhttps://www.fda.gov/media/152166/download  Fact Sheet for Healthcare Providers: SeriousBroker.ithttps://www.fda.gov/media/152162/download  This test is not yet approved or cleared by the [place] FDA and has been authorized for detection and/or diagnosis of SARS-CoV-2 by FDA under an Emergency Use Authorization (EUA). This EUA will remain in effect (meaning this test can be used) for the duration of the COVID-19 declaration under Section 564(b)(1) of the Act, 21 U.S.C. section 360bbb-3(b)(1), unless the authorization is terminated or revoked.  Performed at [facility], [address] [place], [place] [address]   Pregnancy, urine     Status: None   Collection Time: 04/14/22  1:40 PM  Result Value Ref Range   Preg Test, Ur NEGATIVE NEGATIVE    Comment:        THE SENSITIVITY OF THIS METHODOLOGY IS >20  mIU/mL. Performed at [facility], [address] [place], [place] [address]     Blood Alcohol level:  Lab Results  Component Value Date   [facility]   ETH <10 01/18/2022    Physical Findings:  CIWA:    COWS:     Musculoskeletal: Strength & Muscle Tone: within normal limits Gait & Station: normal Patient leans: N/A  Psychiatric Specialty Exam:  Presentation  General Appearance:  Fairly Groomed  Eye Contact: Fair  Speech: Clear and Coherent  Speech Volume: Increased  Handedness: Right   Mood and Affect  Mood: Labile; Angry; Irritable; Anxious; Dysphoric  Affect: Labile   Thought Process  Thought Processes: Irrevelant  Descriptions of Associations:Circumstantial  Orientation:Full (Time, Place and Person)  Thought Content:Illogical; Paranoid Ideation; Obsessions (Obessive thoughts of needing a new DSS caseworker due to current worker is not performing at patient expectations and has not visited her here in ED)  History of Schizophrenia/Schizoaffective disorder:Yes  Duration of Psychotic Symptoms:Greater than six months  Hallucinations:Hallucinations: None  Ideas of Reference:Paranoia; Delusions  Suicidal Thoughts:Suicidal Thoughts: No  Homicidal Thoughts:Homicidal Thoughts: No   Sensorium  Memory: Immediate Fair; Recent Poor (Patient unable to recall medication and inpatient treatment plan discussed yesterday)  Judgment: Poor  Insight: Poor  Executive Functions  Concentration: Poor  Attention Span: Fair  Recall: [employer] of Knowledge: Fair  Language: Fair   Psychomotor Activity  Psychomotor Activity: Psychomotor Activity: Normal   Assets  Assets: Manufacturing systems engineer; Physical Health; Resilience; Desire for Improvement   Sleep  Sleep: Sleep: Good    Physical Exam: Physical Exam Vitals reviewed.  HENT:     Head: Normocephalic.  Eyes:     Extraocular Movements: Extraocular movements  intact.     Conjunctiva/sclera: Conjunctivae normal.     Pupils: Pupils are equal, round, and reactive to light.  Cardiovascular:     Rate and Rhythm: Normal rate and regular rhythm.  Pulmonary:     Effort: Pulmonary effort is normal.     Breath sounds: Normal breath sounds.  Musculoskeletal:     Cervical back: Normal range of motion.  Neurological:     General: No focal deficit present.     Mental Status: She is alert and oriented to person, place, and time.    Review of Systems  Psychiatric/Behavioral:  Positive for depression. Negative for hallucinations, memory loss, substance abuse and suicidal ideas. The patient is nervous/anxious. The patient does not have insomnia.    Blood pressure (!) 129/95, pulse 97, temperature 98.7 F (37.1 C), temperature source Oral, resp. rate 18, SpO2 100 %, unknown if currently breastfeeding. There is no height or weight on file to calculate BMI.   Medical Decision Making: Patient case review and discussed with Dr. Viviano Simas and patient continues to meet inpatient psychiatric treatment criteria.  Patient remains unable to reliably contract for safety.  Social work will continue to work with [facility] to obtain placement for inpatient psychiatric treatment.  Valproic acid level pending for recollect in 2 days. EDP, RN, [name], notified of disposition.   Joaquin Courts, FNP-C, PMHNP-BC 04/15/2022, 11:57 AM

## 2022-04-15 NOTE — ED Notes (Signed)
Pt to nursing station asking to use phone. This RN dialed phone number given by patient and handed the phone to her. Pt hung up the phone and dialed another number, then took phone into room. This RN overheard pt stating, "I need help. I'm at [facility] in the psychiatric ward. She has held my paychecks for two years. I need a new Child psychotherapist. I feel like I'm being abused by the system." Phone retrieved from patient.

## 2022-04-15 NOTE — ED Notes (Signed)
Pt continues to refuse vital signs. Will attempt at a later time.

## 2022-04-15 NOTE — ED Notes (Signed)
Pt at nursing station using phone.

## 2022-04-15 NOTE — ED Notes (Addendum)
1528: Pt woke up from her nap. Writer noticed that pt had soiled herself, Clinical research associate provided pt with all of her shower supplies. Pt currently finishing her coffee that she had requested with her lunch.   1552: Pt refusing to shower now. Pt lying on their bed, eyes close while snoring. RN notified that pt did not want to shower, will attempt once again when pt is wide awake

## 2022-04-15 NOTE — ED Notes (Signed)
RN requested to know what labs were needed. Pt has had a UDS on 12/10 and CBC/CMP on 12/09. Currently waiting on response for additional test needs.

## 2022-04-15 NOTE — Progress Notes (Signed)
CSW sent updated labs to [facility] as requested.CSW awaiting to hear back from [facility] for follow. CSW will assist and follow with placement.  -CSW also checked the [facility] IDD unit [phone] and there are no available beds at this time.  Maryjean Ka, MSW, LCSWA 04/15/2022 10:59 AM

## 2022-04-16 DIAGNOSIS — F259 Schizoaffective disorder, unspecified: Secondary | ICD-10-CM | POA: Diagnosis not present

## 2022-04-16 NOTE — ED Notes (Signed)
Asked pt if she would comply with blood work today, pt refused unless I gave her my bracelet.

## 2022-04-16 NOTE — Progress Notes (Signed)
CSW and Disposition Team member Cathie Beams, [place] contacted pt's [facility] Shelly Coss [phone] and learned that pt does not have a Care Coordinator but does have a Care Manger/ Utilization reviewer who is Pat Kocher [phone] who shared that she is out of the office today but can review pt's chart Monday and provide this team with an update in regard to pt's IQ. Tiona advised that she is unaware if there is documentation on record about pt's IQ. CSW advised that CSW is needing IQ score to determine if a Diversion Exception Worksheet referral is needed due to pt's dx of IDD.  CSW also spoke with pt's Legal Herbie Baltimore [phone] who shared that she obtained guardianship in 2017 when pt first was discharged from [facility]. Jeralyn Bennett also shared that she and pt's ACT have been trying to get pt back to [facility]. Jeralyn Bennett informed that she was unaware of pt's IQ score.  CSW spoke with [facility] and they advised that in 2017 pt did not have an IDD dx in 2017 therefore unaware of IQ. CSW and [facility] intake DeAnna discussed pt being moved to priority waitlist but needing more information on IQ to determine of Diversion is needed and updated labs to inquire about elevated pervious labs. CSW and Disposition team will assist and follow with placement.   Maryjean Ka, MSW, [facility] 04/16/2022 4:49 PM

## 2022-04-16 NOTE — ED Provider Notes (Signed)
Emergency Medicine Observation Re-evaluation Note  Molly [name] is a 36 y.o. female, seen on rounds today.  Pt initially presented to the ED for complaints of Medical Clearance She has a history of schizophrenia and presented w/ concern for hallucinations. Is IVCd. Currently, the patient is sitting on the bed calmly.  Patient requests that I notify the "doctor with dark hair" who performed her pap smear in the ED that she needs to be examined again. She states it was a gynecologist. When I informed the patient that we do not perform pap smears in the emergency department, she stated that she already had one and that I need to contact that doctor. I told her that I could try to find out who the doctor was and give her their clinic phone number, but patient stated, "It's already too late!" I stated that I am the one here today and inquired if she had any vaginal symptoms she would like addressed, such as discomfort, itching, burning, bleeding. She responded, "I am uncomfortable. Are you burning?" and refused to answer further questions.   Physical Exam  BP (!) 129/95 (BP Location: Left Wrist)   Pulse 97   Temp 98.7 F (37.1 C) (Oral)   Resp 18   SpO2 100%  Physical Exam General: Resting  Cardiac: Well perfused Lungs: No increased WOB Psych: Calm  ED Course / MDM  EKG:   I have reviewed the labs performed to date as well as medications administered while in observation.  Plan  Current plan is for TTS eval, inpatient psychiatric admission.    Loetta Rough, MD 04/16/22 [phone]

## 2022-04-16 NOTE — ED Notes (Signed)
Pt currently sitting quietly on side of bed. No needs or concerns voiced at this time.

## 2022-04-16 NOTE — ED Notes (Signed)
Introduced self to pt at this time. Pt states she would like some nicotine gum. Pt sitting on side of bed. No other needs voiced at this time. No signs of distress noted.

## 2022-04-16 NOTE — ED Notes (Signed)
At this time, this RN approached pt to take vital signs. This RN noticed that the pt had taken a pulse ox cord and wrapped it around her neck. When asked about it, pt states she was wearing it as a necklace to "prevent COVID." Pt was asked several times to remove the cord from her neck for safety purposes. The pt then accused this RN stating "You're a pervert. Stop staring at my breasts. I know you want them to be yours." Pt informed that that type of conversation was inappropriate. Pt was asked to sit back in her room for vital signs. Pt voluntarily removed pulse ox cord from around her neck.

## 2022-04-17 DIAGNOSIS — F259 Schizoaffective disorder, unspecified: Secondary | ICD-10-CM | POA: Diagnosis not present

## 2022-04-17 NOTE — ED Provider Notes (Signed)
Emergency Medicine Observation Re-evaluation Note  Molly [name] is a 36 y.o. female, seen on rounds today.  Pt initially presented to the ED for complaints of Medical Clearance Currently, the patient is resting  Physical Exam  BP 133/84 (BP Location: Left Arm)   Pulse 89   Temp 98.1 F (36.7 C) (Oral)   Resp 18   SpO2 98%  Physical Exam General: NAD Cardiac: Regular HR Lungs: No respiratory distress Psych: Stable  ED Course / MDM  EKG:   I have reviewed the labs performed to date as well as medications administered while in observation.    Plan  Current plan is for inpatient psych placement.  Patient is under IVC    Terald Sleeper, MD 04/17/22 1112

## 2022-04-17 NOTE — ED Notes (Signed)
NP using tts to speak with patient.

## 2022-04-17 NOTE — ED Notes (Signed)
Patient refused vital signs to be checked.

## 2022-04-17 NOTE — ED Notes (Signed)
Pt requested nicotine gum.

## 2022-04-17 NOTE — ED Notes (Signed)
Pt continues to refuse VS and blood draw. 

## 2022-04-17 NOTE — ED Notes (Signed)
This RN assumed care of patient. Pt sleeping in gurney at this time. NAD noted.  

## 2022-04-17 NOTE — ED Notes (Signed)
Pt sleeping in room at this time. Normal rise and fall of chest noted. No signs of distress.

## 2022-04-17 NOTE — ED Notes (Signed)
Pt continues to refuse VS and blood draw.

## 2022-04-17 NOTE — Consult Note (Signed)
Telepsych Consultation   Reason for Consult:  psych consult Referring Physician:  Gloris Manchester, MD Location of Patient: [facility] [phone] Location of Provider: Behavioral Health TTS Department  Patient Identification: Molly [name] MRN:  [id number] Principal Diagnosis: Schizoaffective disorder [facility]) Diagnosis:  Principal Problem:   Schizoaffective disorder (HCC) Active Problems:   Involuntary commitment   Hallucinations   Total Time spent with patient: 20 minutes  Subjective:   Molly [name] is a 36 y.o. female patient admitted with hallucinations.  Patient presents sitting on side of her bed. Irritable. She confrontation and oppositional. Initially refused to state name and birthday to confirm identity. Assessment initially delayed due to patient being upset and crying. When provider asked if anything particular was making her upset she replied, "I was crying to because it's my right to cry and if you're telling me the shut up crying then we have a problem". Provider attempted to complete assessment when patient stated, 'Can I get another nurse practitioner? Someone in person because I don't want to talk to you because I'm not suicidal you are. I don't have homicidal thoughts against anyone who doesn't trespass against me'.  Patient remains defensive and suspicious throughout the assessment. Disorganized and illogical thought processes. Refusing to fully participate. Nurse reports this has been patient's baseline. Per chart review patient has been medication compliant; previously received Gean Birchwood IM 156 mg; last filled 02/15/22. . No adjustments recommended at this time. Continued recommendation of inpatient hospitalization as social work continues to work with [facility] regarding appropriate placement.    HPI:  Molly [name] is a 36 year old patient with an extensive past psychiatric history significant for IDD, schizoaffective disorder, hallucinations, aggressive behavior, psychotic disorder,  psychosis who presented to [facility] from [facility] for psychotic behavior. Patient IVC'd in ED. Per chart review, "Patient BIB [facility] from downtown [place] at the patient's request. EMS states patient had been wandering the streets asking people to call 911 for her to which EMS had responded multiple times but then the patient refused to go be transported by EMS. This EMS crew states they were hailed by the patient as they were driving by the area and she requested to be evaluated for abdominal pain that started four months ago. Patient also states that she is currently being crucified. Patient is currently eating a piece of chicken and bread from a wet pillow case, patient has been encouraged to stop eating this food". Hospital social work team currently working with [facility] to locate IQ testing for patient for placement. Patient does have guardian Berenice Bouton [phone]  Past Psychiatric History: DD, schizoaffective disorder, hallucinations, aggressive behavior, psychotic disorder, psychosis; inpatient hospitalizations including [facility]  Risk to Self:   Risk to Others:   Prior Inpatient Therapy:   Prior Outpatient Therapy:    Past Medical History:  Past Medical History:  Diagnosis Date   Abnormal Pap smear    ASC-cannot exclude HGSIL on Pap 02/15/2012   ASC-US on 02/03/2012 pap (associated Trichomonas infection). No reflex HPV testing performed on specimen.  Patient informed that she will need repeat Pap in one year.       Asthma    ATTENTION DEFICIT, W/O HYPERACTIVITY, History of 06/30/2006   Qualifier: History of  By: McDiarmid MD, Jae Dire, GONOCOCCAL, History of 01/09/2007   Qualifier: History of  By: McDiarmid MD, Charlann Boxer ACUMINATA, HISTORY OF 05/12/2009   Qualifier: History of  By: McDiarmid MD, Tawanna Cooler  Depression    Diabetes mellitus    diet controlled   Eczema    Hypertension    Overactive bladder    Schizophrenia (HCC)     SCHIZOPHRENIA, CATATONIC, HISTORY OF 12/13/2006   Annotation: Diagnoses by  Dr. Dennie Bible (Psych) At [facility] in  [place], [place]. 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   Qualifier: Diagnosis of  By: McDiarmid MD, Benjaman Pott USER 02/08/2009   Qualifier: Diagnosis of  By: Knox Royalty      Past Surgical History:  Procedure Laterality Date   INCISION AND DRAINAGE     pilanodal cyst   TIBIA IM NAIL INSERTION Right 10/26/2021   Procedure: INTRAMEDULLARY (IM) NAIL TIBIAL;  Surgeon: Myrene Galas, MD;  Location: [facility] OR;  Service: Orthopedics;  Laterality: Right;   TOOTH EXTRACTION N/A 10/07/2017   Procedure: EXTRACTION TEETH NUMBERS ONE, SEVENTEEN, NINETEEN AND THIRTY TWO;  Surgeon: Ocie Doyne, DDS;  Location: [facility] OR;  Service: Oral Surgery;  Laterality: N/A;   Family History:  Family History  Adopted: Yes  Problem Relation Age of Onset   Bipolar disorder Sister    Alcohol abuse Brother    Cancer Father    Diabetes Mother    Family Psychiatric  History: not noted Social History:  Social History   Substance and Sexual Activity  Alcohol Use No   Comment: occ     Social History   Substance and Sexual Activity  Drug Use No    Social History   Socioeconomic History   Marital status: Single    Spouse name: Not on file   Number of children: Not on file   Years of education: Not on file   Highest education level: Not on file  Occupational History   Not on file  Tobacco Use   Smoking status: Every Day    Packs/day: 2.00    Years: 10.00    Total pack years: 20.00    Types: Cigarettes   Smokeless tobacco: Never  Vaping Use   Vaping Use: Never used  Substance and Sexual Activity   Alcohol use: No    Comment: occ   Drug use: No   Sexual activity: Yes    Birth control/protection: Injection  Other Topics Concern   Not on file  Social History Narrative   Adopted   Has Guardian   Living in  [place] home with Bradly Chris   Transportation: Bus   Social Determinants of Health   Financial Resource Strain: Not on file  Food Insecurity: Not on file  Transportation Needs: Not on file  Physical Activity: Not on file  Stress: Not on file  Social Connections: Not on file   Additional Social History:    Allergies:   Allergies  Allergen Reactions   Abilify [Aripiprazole] Other (See Comments)    Thinks it's nasty- does not want it.  Injection is ok.      Labs: No results found for this or any previous visit (from the past 48 hour(s)).  Medications:  Current Facility-Administered Medications  Medication Dose Route Frequency Provider Last Rate Last Admin   acetaminophen (TYLENOL) tablet 1,000 mg  1,000 mg Oral Q6H PRN Wynetta Fines, MD   1,000 mg at 04/15/22 2243   benztropine (COGENTIN) tablet 1 mg  1 mg Oral BID Gloris Manchester, MD   1 mg at 04/17/22 1052   divalproex (DEPAKOTE) DR tablet 1,000 mg  1,000 mg Oral QHS Bing NeighborsHarris, Kimberly S, FNP   1,000 mg at 04/16/22 2108   divalproex (DEPAKOTE) DR tablet 500 mg  500 mg Oral Nanda QuintonBH-q7a Harris, Kimberly S, FNP   500 mg at 04/17/22 1053   LORazepam (ATIVAN) tablet 1 mg  1 mg Oral Q6H PRN Bing NeighborsHarris, Kimberly S, FNP   1 mg at 04/15/22 2312   Or   LORazepam (ATIVAN) injection 2 mg  2 mg Intramuscular Q6H PRN Bing NeighborsHarris, Kimberly S, FNP       nicotine (NICODERM CQ - dosed in mg/24 hr) patch 7 mg  7 mg Transdermal Daily Wynetta FinesMessick, Peter C, MD   7 mg at 04/15/22 [id number]   nicotine polacrilex (NICORETTE) gum 2 mg  2 mg Oral PRN Pricilla LovelessGoldston, Scott, MD   2 mg at 04/17/22 0452   OLANZapine (ZYPREXA) tablet 10 mg  10 mg Oral Daily Gloris Manchesterixon, Ryan, MD   10 mg at 04/17/22 1053   Current Outpatient Medications  Medication Sig Dispense Refill   benztropine (COGENTIN) 1 MG tablet Take 1 mg by mouth 2 (two) times daily.     divalproex (DEPAKOTE) 500 MG DR tablet Take 1 tablet (500 mg total) by mouth 2 (two) times daily for 7 days. (Patient taking differently: Take  1,000-1,500 mg by mouth See admin instructions. 1000 mg in the morning, 1500 mg in the evening) 14 tablet 0   haloperidol (HALDOL) 10 MG tablet Take 1 tablet (10 mg total) by mouth 2 (two) times daily. 60 tablet 0   lisinopril (ZESTRIL) 5 MG tablet Take 5 mg by mouth daily.     OLANZapine (ZYPREXA) 10 MG tablet Take 1 tablet (10 mg total) by mouth daily for 7 days. (Patient taking differently: Take 10 mg by mouth 2 (two) times daily.) 7 tablet 0   paliperidone (INVEGA SUSTENNA) 156 MG/ML SUSY injection Inject 156 mg into the muscle once. (Patient not taking: Reported on 04/13/2022)      Musculoskeletal: Strength & Muscle Tone: within normal limits Gait & Station: normal Patient leans: Backward  Psychiatric Specialty Exam:  Presentation  General Appearance:  Casual; Bizarre  Eye Contact: Fleeting  Speech: Pressured  Speech Volume: Normal  Handedness: Right   Mood and Affect  Mood: Irritable; Labile; Angry; Dysphoric  Affect: Labile; Inappropriate   Thought Process  Thought Processes: Disorganized; Irrevelant  Descriptions of Associations:Tangential  Orientation:Partial  Thought Content:Paranoid Ideation; Scattered; Tangential; Illogical  History of Schizophrenia/Schizoaffective disorder:Yes  Duration of Psychotic Symptoms:Greater than six months  Hallucinations:Hallucinations: None  Ideas of Reference:Paranoia; Delusions  Suicidal Thoughts:Suicidal Thoughts: No  Homicidal Thoughts:Homicidal Thoughts: No   Sensorium  Memory: Immediate Poor; Recent Poor  Judgment: Impaired  Insight: None; Poor   Executive Functions  Concentration: Poor  Attention Span: Poor  Recall: [employer]  Fund of Knowledge: Fair  Language: Fair   Psychomotor Activity  Psychomotor Activity:Psychomotor Activity: Normal   Assets  Assets: Resilience; Physical Health; Social Support   Sleep  Sleep:Sleep: Fair    Physical Exam: Physical Exam Vitals and  nursing note reviewed.  Constitutional:      Appearance: She is obese.  HENT:     Head: Normocephalic.     Nose: Nose normal.     Mouth/Throat:     Mouth: Mucous membranes are moist.     Pharynx: Oropharynx is clear.  Eyes:     Pupils: Pupils are equal, round, and reactive to light.  Pulmonary:     Effort: Pulmonary effort is normal.  Musculoskeletal:  General: Normal range of motion.     Cervical back: Normal range of motion.  Neurological:     Mental Status: She is alert. Mental status is at baseline.  Psychiatric:        Attention and Perception: She is inattentive.        Mood and Affect: Affect is labile, blunt and angry.        Speech: Speech is rapid and pressured.        Behavior: Behavior is uncooperative, agitated and withdrawn.        Thought Content: Thought content is paranoid and delusional. Thought content does not include homicidal or suicidal ideation. Thought content does not include homicidal or suicidal plan.        Cognition and Memory: Cognition is impaired.        Judgment: Judgment is impulsive and inappropriate.    Review of Systems  All other systems reviewed and are negative.  Blood pressure 133/84, pulse 89, temperature 98.1 F (36.7 C), temperature source Oral, resp. rate 18, SpO2 98 %, unknown if currently breastfeeding. There is no height or weight on file to calculate BMI.  Treatment Plan Summary: Daily contact with patient to assess and evaluate symptoms and progress in treatment, Medication management, and Plan continued psychiatric inpatient hospitalization. Psychiatric team including social work currently working with [facility] seeking appropriate placement.   Disposition: Recommend psychiatric Inpatient admission when medically cleared. Supportive therapy provided about ongoing stressors. Discussed crisis plan, support from social network, calling 911, coming to the Emergency Department, and calling Suicide Hotline.  This service was  provided via telemedicine using a 2-way, interactive audio and video technology.  Names of all persons participating in this telemedicine service and their role in this encounter. Name: Maxie Barb Role: PMHNP  Name: Phineas Inches  Role: Attending MD  Name: Molly [name] Role: patient  Name:  Role:     Loletta Parish, NP 04/17/2022 5:28 PM

## 2022-04-17 NOTE — ED Notes (Signed)
Pt requested a food and was informed dinner will arrive soon and will be brought to her when it arrives.

## 2022-04-17 NOTE — ED Notes (Signed)
New set of IVC paperwork has been done original in red folder, 3 copies in purple and 1 in medical records , and faxed over to [facility]

## 2022-04-17 NOTE — ED Notes (Signed)
Pt provided with supplies and towels to take a shower.

## 2022-04-17 NOTE — ED Provider Notes (Signed)
IVC papers were set to expire today and have been refiled by myself.  Given to Diplomatic Services operational officer now.   Terald Sleeper, MD 04/17/22 [phone]

## 2022-04-18 DIAGNOSIS — F259 Schizoaffective disorder, unspecified: Secondary | ICD-10-CM | POA: Diagnosis not present

## 2022-04-18 MED ORDER — ZIPRASIDONE MESYLATE 20 MG IM SOLR
20.0000 mg | Freq: Once | INTRAMUSCULAR | Status: AC
  Administered 2022-04-18: 20 mg via INTRAMUSCULAR
  Filled 2022-04-18: qty 20

## 2022-04-18 MED ORDER — LORAZEPAM 2 MG/ML IJ SOLN
1.0000 mg | Freq: Once | INTRAMUSCULAR | Status: AC
  Administered 2022-04-18: 1 mg via INTRAMUSCULAR
  Filled 2022-04-18: qty 1

## 2022-04-18 MED ORDER — LORAZEPAM 0.5 MG PO TABS: 0.5000 mg | ORAL_TABLET | ORAL | Status: DC | PRN

## 2022-04-18 MED ORDER — OLANZAPINE 5 MG PO TABS
15.0000 mg | ORAL_TABLET | Freq: Every day | ORAL | Status: DC
  Administered 2022-04-18 – 2022-05-08 (×21): 15 mg via ORAL
  Filled 2022-04-18 (×21): qty 1

## 2022-04-18 MED ORDER — ZIPRASIDONE MESYLATE 20 MG IM SOLR
20.0000 mg | Freq: Every day | INTRAMUSCULAR | Status: DC | PRN
  Administered 2022-04-19 – 2022-05-02 (×6): 20 mg via INTRAMUSCULAR
  Filled 2022-04-18 (×5): qty 20

## 2022-04-18 MED ORDER — LORAZEPAM 2 MG/ML IJ SOLN: 2.0000 mg | Freq: Four times a day (QID) | INTRAMUSCULAR | Status: DC | PRN

## 2022-04-18 NOTE — Progress Notes (Signed)
[facility] Psych ED Progress Note  04/18/2022 12:28 PM Loida Calamia  MRN:  [id number]   Principal Problem: Schizoaffective disorder [facility]) Diagnosis:  Principal Problem:   Schizoaffective disorder (HCC) Active Problems:   Involuntary commitment   Hallucinations   ED Assessment Time Calculation: Start Time: 0945 Stop Time: 1010 Total Time in Minutes (Assessment Completion): 25    Patient has a DSS legal guardian,  [name] [facility]) Office: [phone], Cell: [phone]  Felecity Lemaster is a 36 year old female, seen for reevaluation, per TTS psychiatric consult.  Patient presented to the ED on 04/10/2022 complaining of hallucinations.  Patient subsequently developed symptoms concerning of acute psychosis with decompensation of schizophrenia.  Patient was noted to be disheveled and responding to internal stimuli during her initial psychiatric exam.  Patient has require agitation medication several times during this ED admission for agitation and verbally aggressive or oppositional behavioral.  Patient is currently  recommended for inpatient psychiatric treatment however given her history of IDD, inability to locate prior IQ score, inpatient psychiatric facilities have denied requests for inpatient psychiatric treatment. Social work is currently working with [facility] to secure placement in their inpatient facility. On evaluation, patient remains delusional. Today she tells me that she's had 12 babies, and "their bodies break in half , reattach, and now I have lots of babies". Latanza goes on to report to this Clinical research associate that someone stole her identify and now she has pending charges. Patient continues to deny SI/HI, however remains delusional, agitated, with disorganized thoughts.  During evaluation Deborrah Mabin is sitting on the edge of bed with no acute distress. She is alert, oriented x 3, although remains disoriented with poor insight to current mental health crisis. Patient is labile with period of  anxiousness, euphoria, to irritability with congruent affect.  She has normal speech, and inappropriate behavior (patient stood up and slammed the door after provider walked out). Objectively, patient appears psychotic, ongoing despite medication interventions. Truc has delusional thinking, although, patient does not appear to be responding to internal stimuli.  Patient continues to meet criteria for inpatient psychiatric treatment due to decompensation of schizophrenia.   [place] Scale:  Flowsheet Row ED from 04/10/2022 in [facility]ERGENCY [facility]PART[facility] ED from 04/08/2022 in [facility] from 04/06/2022 in [facility]  C-SSRS RISK CATEGORY No Risk No Risk No Risk       Past Medical History:  Past Medical History:  Diagnosis Date   Abnormal Pap smear    ASC-cannot exclude HGSIL on Pap 02/15/2012   ASC-US on 02/03/2012 pap (associated Trichomonas infection). No reflex HPV testing performed on specimen.  Patient informed that she will need repeat Pap in one year.       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 Dennie Bible (Psych) At [facility] in  [place], [place]. 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   Qualifier: Diagnosis of  By: McDiarmid MD, Benjaman Pottodd     TOBACCO USER 02/08/2009   Qualifier: Diagnosis of  By: Knox Royaltydell, Erin      Past Surgical History:  Procedure  Laterality Date   INCISION AND DRAINAGE     pilanodal cyst   TIBIA IM NAIL INSERTION Right 10/26/2021   Procedure: INTRAMEDULLARY (IM) NAIL TIBIAL;  Surgeon: Myrene GalasHandy, Michael, MD;  Location: [facility] OR;  Service: Orthopedics;  Laterality: Right;   TOOTH EXTRACTION N/A 10/07/2017   Procedure: EXTRACTION TEETH NUMBERS ONE, SEVENTEEN, NINETEEN AND THIRTY TWO;  Surgeon: Ocie DoyneJensen, Scott, DDS;  Location: [facility] OR;  Service: Oral Surgery;  Laterality: N/A;   Family History:  Family History  Adopted: Yes  Problem Relation Age of Onset   Bipolar disorder Sister    Alcohol abuse Brother    Cancer Father    Diabetes Mother    Social History:  Social History   Substance and Sexual Activity  Alcohol Use No   Comment: occ     Social History   Substance and Sexual Activity  Drug Use No    Social History   Socioeconomic History   Marital status: Single    Spouse name: Not on file   Number of children: Not on file   Years of education: Not on file   Highest education level: Not on file  Occupational History   Not on file  Tobacco Use   Smoking status: Every Day    Packs/day: 2.00    Years: 10.00    Total pack years: 20.00    Types: Cigarettes   Smokeless tobacco: Never  Vaping Use   Vaping Use: Never used  Substance and Sexual Activity   Alcohol use: No    Comment: occ   Drug use: No   Sexual activity: Yes    Birth control/protection: Injection  Other Topics Concern   Not on file  Social History Narrative   Adopted   Has Guardian   [place] with [name]   Transportation: Bus   Social Determinants of Health   Financial Resource Strain: Not on file  Food Insecurity: Not on file  Transportation Needs: Not on file  Physical Activity: Not on file  Stress: Not on file  Social Connections: Not on file    Current Medications: Current Facility-Administered Medications  Medication Dose Route Frequency Provider Last Rate Last Admin   benztropine (COGENTIN) tablet  1 mg  1 mg Oral BID Gloris Manchesterixon, Ryan, MD   1 mg at 04/18/22 1019   divalproex (DEPAKOTE) DR tablet 1,000 mg  1,000 mg Oral QHS Bing NeighborsHarris, Carlia Bomkamp S, FNP   1,000 mg at 04/17/22 2146   divalproex (DEPAKOTE) DR tablet 500 mg  500 mg Oral Nanda QuintonBH-q7a Epifanio Labrador S, FNP   500 mg at 04/18/22 [id number]   LORazepam (ATIVAN) tablet 0.5 mg  0.5 mg Oral Q4H PRN Bing NeighborsHarris, Landree Fernholz S, FNP       Or   LORazepam (ATIVAN) injection 2 mg  2 mg Intramuscular Q6H PRN Bing NeighborsHarris, Matison Nuccio S, FNP       nicotine polacrilex (NICORETTE) gum 2 mg  2 mg Oral PRN Pricilla LovelessGoldston, Scott, MD   2 mg at 04/18/22 1020   OLANZapine (ZYPREXA) tablet 15 mg  15 mg Oral QHS Bing NeighborsHarris, Nikolay Demetriou S, FNP       ziprasidone (GEODON) injection 20 mg  20 mg Intramuscular Daily PRN Bing NeighborsHarris, Shontell Prosser S, FNP       Current  Outpatient Medications  Medication Sig Dispense Refill   benztropine (COGENTIN) 1 MG tablet Take 1 mg by mouth 2 (two) times daily.     divalproex (DEPAKOTE) 500 MG DR tablet Take 1 tablet (500 mg total) by mouth 2 (two) times daily for 7 days. (Patient taking differently: Take 1,000-1,500 mg by mouth See admin instructions. 1000 mg in the morning, 1500 mg in the evening) 14 tablet 0   haloperidol (HALDOL) 10 MG tablet Take 1 tablet (10 mg total) by mouth 2 (two) times daily. 60 tablet 0   lisinopril (ZESTRIL) 5 MG tablet Take 5 mg by mouth daily.     OLANZapine (ZYPREXA) 10 MG tablet Take 1 tablet (10 mg total) by mouth daily for 7 days. (Patient taking differently: Take 10 mg by mouth 2 (two) times daily.) 7 tablet 0   paliperidone (INVEGA SUSTENNA) 156 MG/ML SUSY injection Inject 156 mg into the muscle once. (Patient not taking: Reported on 04/13/2022)      Lab Results: No results found for this or any previous visit (from the past 48 hour(s)).  Blood Alcohol level:  Lab Results  Component Value Date   [facility] <10 04/01/2022   ETH <10 01/18/2022    Physical Findings:   Psychiatric Specialty Exam:  Presentation  General Appearance:  Casual;  Bizarre  Eye Contact: Fleeting  Speech: Pressured  Speech Volume: Normal  Handedness: Right   Mood and Affect  Mood: Irritable; Labile; Angry; Dysphoric  Affect: Labile; Inappropriate   Thought Process  Thought Processes: Disorganized; Irrevelant  Descriptions of Associations:Tangential  Orientation:Partial  Thought Content:Paranoid Ideation; Scattered; Tangential; Illogical  History of Schizophrenia/Schizoaffective disorder:Yes  Duration of Psychotic Symptoms:Greater than six months  Hallucinations:Hallucinations: None  Ideas of Reference:Paranoia; Delusions  Suicidal Thoughts:Suicidal Thoughts: No  Homicidal Thoughts:Homicidal Thoughts: No   Sensorium  Memory: Immediate Poor; Recent Poor  Judgment: Impaired  Insight: None; Poor   Executive Functions  Concentration: Poor  Attention Span: Poor  Recall: [employer] of Knowledge: Fair  Language: Fair   Psychomotor Activity  Psychomotor Activity: Psychomotor Activity: Normal   Assets  Assets: Resilience; Physical Health; Social Support   Sleep  Sleep: Sleep: Fair    Physical Exam: Physical Exam Vitals reviewed.  HENT:     Head: Normocephalic.     Right Ear: External ear normal.     Left Ear: External ear normal.     Nose: Nose normal.  Eyes:     Extraocular Movements: Extraocular movements intact.     Conjunctiva/sclera: Conjunctivae normal.     Pupils: Pupils are equal, round, and reactive to light.  Pulmonary:     Effort: Pulmonary effort is normal.     Breath sounds: Normal breath sounds.  Musculoskeletal:        General: Normal range of motion.     Cervical back: Normal range of motion.  Neurological:     General: No focal deficit present.     Mental Status: She is alert.    Review of Systems  Unable to perform ROS: Psychiatric disorder   Blood pressure (!) 165/95, pulse (!) 108, temperature 97.7 F (36.5 C), temperature source Axillary, resp. rate  16, height 5\' 8"  (1.727 m), weight 123.8 kg, SpO2 99 %, unknown if currently breastfeeding. Body mass index is 41.5 kg/m.   Medical Decision Making: Patient case review and discussed with Dr. , patient meets criteria for inpatient psychiatric treatment. Patient is unable to reliably contract for safety at this time. There is currently no  appropriate  bed availability at [facility]. CSW notified actively working to place have patient placed on the [facility] wait list. EDP, RN, [name], notified of disposition.   Problem 1: Schizoaffective Disorder Olanzapine increased from 10 mg to 15 mg daily at bedtime. For agitation and anxiety, Ativan 0.5 mg every 4 hours, PRN. For severe aggression, agitation, Ativan 2 mg every 6 hours.10 mg once PRN.     Joaquin Courts, FNP-C, PMHNP-BC 04/18/2022, 12:28 PM

## 2022-04-18 NOTE — ED Notes (Signed)
Patient very agitated and verbally aggressive. Dr Oletta Cohn made aware and meds were prescribed.

## 2022-04-18 NOTE — ED Notes (Signed)
Patient refuses VS at this time.

## 2022-04-18 NOTE — ED Notes (Signed)
IVC paperwork filed on 04/17/2022, expires in 7 days on 04/24/2022.

## 2022-04-18 NOTE — ED Notes (Signed)
Patient provided graham crackers and juice per request. Patient then threw items on the floor.

## 2022-04-18 NOTE — ED Provider Notes (Signed)
Patient agitated, yelling and striking at staff. Meds ordered.   Gilda Crease, MD 04/18/22 0040

## 2022-04-18 NOTE — ED Provider Notes (Signed)
Emergency Medicine Observation Re-evaluation Note  Molly [name] is a 36 y.o. female, seen on rounds today.  Pt initially presented to the ED for complaints of Medical Clearance Currently, the patient is sleeping.  Physical Exam  BP (!) 165/95 (BP Location: Right Arm)   Pulse (!) 108   Temp 97.7 F (36.5 C) (Axillary)   Resp 16   Ht 5\' 8"  (1.727 m)   Wt 123.8 kg   SpO2 99%   BMI 41.50 kg/m  Physical Exam General: NAD Cardiac: Tachycardic Lungs: No respiratory distress  ED Course / MDM  EKG:   I have reviewed the labs performed to date as well as medications administered while in observation.  Recent changes in the last 24 hours include patient requiring sedative medications overnight due to agitation, striking out at staff.  Plan  Current plan is for continued search for psychiatric placement.  The patient remains under IVC    , MD 04/18/22 [phone]

## 2022-04-18 NOTE — ED Notes (Signed)
Review of chart and discussion with RN: IVC'd 04/17/22, exp. 04/24/22. IVC documentation in purple zone.

## 2022-04-19 DIAGNOSIS — F259 Schizoaffective disorder, unspecified: Secondary | ICD-10-CM | POA: Diagnosis not present

## 2022-04-19 DIAGNOSIS — F250 Schizoaffective disorder, bipolar type: Secondary | ICD-10-CM | POA: Diagnosis not present

## 2022-04-19 MED ORDER — STERILE WATER FOR INJECTION IJ SOLN
INTRAMUSCULAR | Status: AC
  Filled 2022-04-19: qty 10

## 2022-04-19 MED ORDER — LORAZEPAM 1 MG PO TABS
1.0000 mg | ORAL_TABLET | Freq: Four times a day (QID) | ORAL | Status: DC | PRN
  Administered 2022-04-20 – 2022-05-08 (×14): 1 mg via ORAL
  Filled 2022-04-19 (×16): qty 1

## 2022-04-19 MED ORDER — LORAZEPAM 2 MG/ML IJ SOLN
2.0000 mg | Freq: Four times a day (QID) | INTRAMUSCULAR | Status: DC | PRN
  Administered 2022-04-27 – 2022-04-29 (×2): 2 mg via INTRAMUSCULAR
  Filled 2022-04-19 (×3): qty 1

## 2022-04-19 NOTE — ED Notes (Signed)
ED Provider at bedside. 

## 2022-04-19 NOTE — ED Notes (Signed)
Water noted from under door in shower. Door to shower room opened and it is noted that patient has covered up both drains in floor with towels. Towels removed and water drains. Pt refusing to remove underwear or bra made from mesh underwear. Pt instructed to remove underwear and bra so that she can put clean ones on after shower. Pt refused, and began yelling, "I've been wearing these for weeks! There's nothing wrong with them! I ain't undressing for nobody! Get the fuck outta my face!" This RN removed soiled clothes and left clean ones for patient. Pt then became extremely upset and agitated, stating that she needs "the virgin robe" (referring to [facility] sheet that was removed from the shower). Pt put on clean scrubs without drying off or removing dirty underwear or bra. Pt went back to room, then began yelling and throwing objects from room. Pt slamming door repeatedly and screaming. Security in department attempting to deescalate patient as well.

## 2022-04-19 NOTE — ED Notes (Signed)
Pt sitting on side of bed, stating "Whatever you gave me I'm allergic to. I'm high. Can you tell the doctor to get in here so I can get un-high?" Pt assured that the medication side effects are anticipated and that we will continue to monitor her.

## 2022-04-19 NOTE — ED Notes (Signed)
Pt to nursing station asking security and staff to "take the gum out of my mouth." Pt told to spit gum out into trash if she was finished. Pt went to bathroom and spit gum out.

## 2022-04-19 NOTE — ED Notes (Signed)
Patient asked for hot tea and was informed that she could only have iced tea. Patient then asked for tea packets and hot water to make her own tea. This writer made patient iced tea and placed in pt's room. Patient became visibly agitated and stated "I asked to make it myself! Bring me 3 tea bag and hot water and I make it myself! I'll wait!" Patient was educated on why we could not bring her hot water as it was a safety issue. Patient grabbed cup on tea and threw it on the floor of the shower room.

## 2022-04-19 NOTE — Progress Notes (Signed)
Per Eligha Bridegroom, NP, pt continues to require inpatient treatment. CSW completed Diversion Request form and Regional Referral form. CSW sent diversion application to [facility] via secure email [email or web address]. CSW also sent updated notes and labs to [facility] via fax [phone]. Pt is currently still under review at [facility]. CSW will continue to assist and follow with placement.  Cathie Beams, Theresia Majors  04/19/2022 2:44 PM

## 2022-04-19 NOTE — Progress Notes (Signed)
CSW spoke with [facility] in admissions at [facility]. Pt is still under review at [facility]. CSW will continue to assist and follow with placement.  Cathie Beams, Theresia Majors  04/19/2022 9:54 AM

## 2022-04-19 NOTE — ED Notes (Signed)
IVC 04/17/2022 Expires 04/24/22,IVC Docs in [employer]

## 2022-04-19 NOTE — Progress Notes (Signed)
[facility] Psych ED Progress Note  04/19/2022 2:07 PM Molly [name]  MRN:  [id number]   Subjective:   Pt seen at [name], ED for face-to-face reevaluation.  She continues to be delusional and disorganized.  She begins telling me that her father is horny and she gets embarrassed because he likes to have sex with ugly women.  She then became tearful stating it makes her feel bad and it makes her feel ugly that her father wants to be with ugly women.  She then states she loves her father and mother and they are both gorgeous.  I attempted to change the subject to talk about how she is feeling in her current medications.  She became very agitated stating she does not need to take any medications and these medications make her high.  Patient stated "I'm high right now. How about this, my voice is high."  Patient then starts talk about her father again and tells me her father is Eston Esters.  Patient remains delusional, tangential, labile, and has pressured speech. She denies SI/HI/AVH. However, she does appear preoccupied at times during assessment.   Valproic Acid level assessed on 04/13/22 and resulted subtherapeutic at 47. It appears Depakote was increased to 500 mg Qam and 1000 mg Qhs on 04/15/22. Will order repeat trough Valproic Acid level to be drawn tonight to assess therapeutic range. Pt continues to be on [facility] waitlist. CSW will continue to assist and follow up. Psychiatry will continue to follow up.   Principal Problem: Schizoaffective disorder (HCC) Diagnosis:  Principal Problem:   Schizoaffective disorder (HCC) Active Problems:   Involuntary commitment   Hallucinations   ED Assessment Time Calculation: Start Time: 1330 Stop Time: 1350 Total Time in Minutes (Assessment Completion): 20   [place] Scale:  Flowsheet Row ED from 04/10/2022 in [facility] EMERGENCY DEPARTMENT ED from 04/08/2022 in [facility] from 04/06/2022 in [facility]  C-SSRS RISK CATEGORY No Risk No Risk No Risk       Past Medical History:  Past Medical History:  Diagnosis Date   Abnormal Pap smear    ASC-cannot exclude HGSIL on Pap 02/15/2012   ASC-US on 02/03/2012 pap (associated Trichomonas infection). No reflex HPV testing performed on specimen.  Patient informed that she will need repeat Pap in one year.       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 Dennie Bible (Psych) At [facility] in  [place], [place]. 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   Qualifier: Diagnosis of  By: McDiarmid MD, Benjaman Pott USER 02/08/2009   Qualifier: Diagnosis of  By: Knox Royalty      Past Surgical History:  Procedure Laterality Date   INCISION AND DRAINAGE     pilanodal cyst   TIBIA IM NAIL INSERTION Right  10/26/2021   Procedure: INTRAMEDULLARY (IM) NAIL TIBIAL;  Surgeon: Myrene Galas, MD;  Location: [facility] OR;  Service: Orthopedics;  Laterality: Right;   TOOTH EXTRACTION N/A 10/07/2017   Procedure: EXTRACTION TEETH NUMBERS ONE, SEVENTEEN, NINETEEN AND THIRTY TWO;  Surgeon: Ocie Doyne, DDS;  Location: [facility] OR;  Service: Oral Surgery;  Laterality: N/A;   Family History:  Family History  Adopted: Yes  Problem Relation Age of Onset   Bipolar disorder Sister    Alcohol abuse Brother    Cancer Father    Diabetes Mother    Social History:  Social History   Substance and Sexual  Activity  Alcohol Use No   Comment: occ     Social History   Substance and Sexual Activity  Drug Use No    Social History   Socioeconomic History   Marital status: Single    Spouse name: Not on file   Number of children: Not on file   Years of education: Not on file   Highest education level: Not on file  Occupational History   Not on file  Tobacco Use   Smoking status: Every Day    Packs/day: 2.00    Years: 10.00    Total pack years: 20.00    Types: Cigarettes   Smokeless tobacco: Never  Vaping Use   Vaping Use: Never used  Substance and Sexual Activity   Alcohol use: No    Comment: occ   Drug use: No   Sexual activity: Yes    Birth control/protection: Injection  Other Topics Concern   Not on file  Social History Narrative   Adopted   Has Guardian   Living in [place] home with Bradly Chris   Transportation: Bus   Social Determinants of Health   Financial Resource Strain: Not on file  Food Insecurity: Not on file  Transportation Needs: Not on file  Physical Activity: Not on file  Stress: Not on file  Social Connections: Not on file    Sleep: Fair  Appetite:  Good  Current Medications: Current Facility-Administered Medications  Medication Dose Route Frequency Provider Last Rate Last Admin   benztropine (COGENTIN) tablet 1 mg  1 mg Oral BID Gloris Manchester, MD   1 mg at 04/19/22 1017   divalproex (DEPAKOTE) DR tablet 1,000 mg  1,000 mg Oral QHS Bing Neighbors, FNP   1,000 mg at 04/18/22 2053   divalproex (DEPAKOTE) DR tablet 500 mg  500 mg Oral Nanda Quinton, FNP   500 mg at 04/19/22 1018   LORazepam (ATIVAN) tablet 0.5 mg  0.5 mg Oral Q4H PRN Bing Neighbors, FNP       Or   LORazepam (ATIVAN) injection 2 mg  2 mg Intramuscular Q6H PRN Bing Neighbors, FNP       nicotine polacrilex (NICORETTE) gum 2 mg  2 mg Oral PRN Pricilla Loveless, MD   2 mg at 04/18/22 1020   OLANZapine (ZYPREXA) tablet 15 mg  15 mg Oral QHS Bing Neighbors,  FNP   15 mg at 04/18/22 2052   ziprasidone (GEODON) injection 20 mg  20 mg Intramuscular Daily PRN Bing Neighbors, FNP   20 mg at 04/19/22 1102   Current Outpatient Medications  Medication Sig Dispense Refill   benztropine (COGENTIN) 1 MG tablet Take 1 mg by mouth 2 (two) times daily.     divalproex (DEPAKOTE) 500 MG DR tablet Take 1 tablet (500 mg total) by mouth 2 (two) times daily for 7  days. (Patient taking differently: Take 1,000-1,500 mg by mouth See admin instructions. 1000 mg in the morning, 1500 mg in the evening) 14 tablet 0   haloperidol (HALDOL) 10 MG tablet Take 1 tablet (10 mg total) by mouth 2 (two) times daily. 60 tablet 0   lisinopril (ZESTRIL) 5 MG tablet Take 5 mg by mouth daily.     OLANZapine (ZYPREXA) 10 MG tablet Take 1 tablet (10 mg total) by mouth daily for 7 days. (Patient taking differently: Take 10 mg by mouth 2 (two) times daily.) 7 tablet 0   paliperidone (INVEGA SUSTENNA) 156 MG/ML SUSY injection Inject 156 mg into the muscle once. (Patient not taking: Reported on 04/13/2022)      Lab Results: No results found for this or any previous visit (from the past 48 hour(s)).  Blood Alcohol level:  Lab Results  Component Value Date   [facility] <10 04/01/2022   ETH <10 01/18/2022   Psychiatric Specialty Exam:  Presentation  General Appearance:  Fairly Groomed  Eye Contact: Fair  Speech: Pressured  Speech Volume: Normal  Handedness: Right   Mood and Affect  Mood: Labile  Affect: Congruent   Thought Process  Thought Processes: Disorganized  Descriptions of Associations:Tangential  Orientation:Full (Time, Place and Person)  Thought Content:Illogical; Tangential  History of Schizophrenia/Schizoaffective disorder:Yes  Duration of Psychotic Symptoms:Greater than six months  Hallucinations:Hallucinations: None  Ideas of Reference:Delusions  Suicidal Thoughts:No data recorded Homicidal Thoughts:Homicidal Thoughts: No   Sensorium   Memory: Immediate Fair; Recent Fair  Judgment: Impaired  Insight: Lacking   Executive Functions  Concentration: Poor  Attention Span: Poor  Recall: [employer] of Knowledge: Fair  Language: Fair   Psychomotor Activity  Psychomotor Activity: Psychomotor Activity: Restlessness   Assets  Assets: Physical Health; Resilience; Social Support   Sleep  Sleep: Sleep: Fair    Physical Exam: Physical Exam Neurological:     Mental Status: She is alert and oriented to person, place, and time.  Psychiatric:        Attention and Perception: Attention normal.        Mood and Affect: Affect is labile.        Speech: Speech is rapid and pressured.        Behavior: Behavior is agitated.        Thought Content: Thought content is delusional.        Judgment: Judgment is impulsive.    Review of Systems  Psychiatric/Behavioral:         RTIS, delusional, labile, irritable  All other systems reviewed and are negative.  Blood pressure (!) 161/140, pulse 98, temperature 97.8 F (36.6 C), temperature source Oral, resp. rate 14, height 5\' 8"  (1.727 m), weight 123.8 kg, SpO2 100 %, unknown if currently breastfeeding. Body mass index is 41.5 kg/m.   Medical Decision Making: Pt case reviewed and discussed with Dr. . Pt continues to meet criteria for IP treatment and IVC. Pt continues to be on [facility] wait list.   - No further medication changes, Valproic Acid level to be drawn and assessed prior to any changes.    Viviano Simas, NP 04/19/2022, 2:07 PM

## 2022-04-19 NOTE — ED Notes (Signed)
RN was informed by sitter that patient offered him money and he saw a least a $20 bill on the patient; RN asked patient to place money in envelop to be locked up with security; pt refused and started yelling. Pt got aggressive and states she put her menstruation blood on the money; pt pulled a folded piece of paper out her bosom with $20 bill and 2 x $10 bills; Money placed sealed envelop and locked up with security-Monique,RN

## 2022-04-19 NOTE — ED Notes (Signed)
Pt requesting to shower. Pt provided with soap, clean towels, wash cloths, clean set of scrubs to change into. Pt instructed to take everything off and put fresh clothes on. Pt has collected several days worth of clothes, blankets, scrubs, etc and is wearing them soaked in urine. Pt verbalizes understanding, stating, "I know how to fucking bathe."

## 2022-04-19 NOTE — ED Notes (Signed)
Pt on phone with ACT team asking for medications that will not make her high.

## 2022-04-19 NOTE — ED Notes (Signed)
Pt refusing VS. Pt requesting to take Depakote at a later time.

## 2022-04-19 NOTE — ED Notes (Signed)
Linens changed. Pt in shower.

## 2022-04-19 NOTE — ED Provider Notes (Signed)
Emergency Medicine Observation Re-evaluation Note  Molly [name] is a 36 y.o. female, seen on rounds today.  Pt initially presented to the ED for complaints of Medical Clearance Currently, the patient is sleeping, in no distress.  Physical Exam  BP (!) 161/140 (BP Location: Left Wrist) Comment: Pt refused recheck  Pulse 98   Temp 97.8 F (36.6 C) (Oral)   Resp 14   Ht 5\' 8"  (1.727 m)   Wt 123.8 kg   SpO2 100%   BMI 41.50 kg/m  Physical Exam General: Sleeping, no distress Cardiac: Regular rate and rhythm Lungs: No increased work of breathing Psych: Calm  ED Course / MDM  EKG:   I have reviewed the labs performed to date as well as medications administered while in observation.  Recent changes in the last 24 hours include nursing notes no notable events.  Plan  Current plan is for placement.  Her most recent progress note from behavioral health:  "Molly [name] is a 36 year old female, seen for reevaluation, per TTS psychiatric consult.  Patient presented to the ED on 04/10/2022 complaining of hallucinations.  Patient subsequently developed symptoms concerning of acute psychosis with decompensation of schizophrenia.  Patient was noted to be disheveled and responding to internal stimuli during her initial psychiatric exam.  Patient has require agitation medication several times during this ED admission for agitation and verbally aggressive or oppositional behavioral.  Patient is currently  recommended for inpatient psychiatric treatment however given her history of IDD, inability to locate prior IQ score, inpatient psychiatric facilities have denied requests for inpatient psychiatric treatment. Social work is currently working with [facility] to secure placement in their inpatient facility. "    [facility], MD 04/19/22 1025

## 2022-04-20 DIAGNOSIS — F259 Schizoaffective disorder, unspecified: Secondary | ICD-10-CM | POA: Diagnosis not present

## 2022-04-20 LAB — VALPROIC ACID LEVEL: Valproic Acid Lvl: 45 ug/mL — ABNORMAL LOW (ref 50.0–100.0)

## 2022-04-20 MED ORDER — OLANZAPINE 5 MG PO TBDP
5.0000 mg | ORAL_TABLET | Freq: Every day | ORAL | Status: DC
  Administered 2022-04-21 – 2022-05-09 (×19): 5 mg via ORAL
  Filled 2022-04-20 (×19): qty 1

## 2022-04-20 MED ORDER — ZIPRASIDONE MESYLATE 20 MG IM SOLR
20.0000 mg | Freq: Once | INTRAMUSCULAR | Status: DC
  Filled 2022-04-20: qty 20

## 2022-04-20 MED ORDER — DIVALPROEX SODIUM 500 MG PO DR TAB
1000.0000 mg | DELAYED_RELEASE_TABLET | Freq: Two times a day (BID) | ORAL | Status: DC
  Administered 2022-04-20 – 2022-04-25 (×10): 1000 mg via ORAL
  Filled 2022-04-20 (×10): qty 2

## 2022-04-20 MED ORDER — ACETAMINOPHEN 325 MG PO TABS
650.0000 mg | ORAL_TABLET | Freq: Once | ORAL | Status: AC
  Administered 2022-04-20: 650 mg via ORAL
  Filled 2022-04-20: qty 2

## 2022-04-20 MED ORDER — STERILE WATER FOR INJECTION IJ SOLN
INTRAMUSCULAR | Status: AC
  Filled 2022-04-20: qty 10

## 2022-04-20 NOTE — ED Notes (Signed)
Pt quite at this time . Sitter out side door.

## 2022-04-20 NOTE — ED Notes (Signed)
PT has been awake and has been refusing vitals

## 2022-04-20 NOTE — Consult Note (Signed)
Attempted to see patient for face to face reevaluation. However she refused to speak with me or participate in assessment. She appeared disheveled, smelled of urine, and would not make eye contact/respond to any questions.   Will increase Zyprexa to a total of 20 mg daily. She will receive an additional 5 mg po Qam and continue with 15 mg Qhs.   Pt also compliant with valproic acid level yesterday evening on 04/19/22 which resulted subtherapeutic at 45. According to [facility] patient has been compliant with all medications consistently. Will increase Depakote to 1,000 mg BID.   Pt continues to be on [facility] waitlist. CSW to continue to follow up.

## 2022-04-20 NOTE — ED Notes (Signed)
Pt refuses vitals  

## 2022-04-20 NOTE — Progress Notes (Signed)
CSW received phone call from [facility] admissions [phone], reporting that their Medical Director and care team reviewed pt's referral. Medical Director reports they cannot find any documentation which supports pt's diagnosis of IDD. [facility] also reports that pt has been approved and is currently on [facility] waitlist due to no open beds. CSW will continue to assist and follow with placement.  Cathie Beams, [place]  04/20/2022 10:34 AM

## 2022-04-20 NOTE — ED Notes (Signed)
Pt now refusing pain med

## 2022-04-20 NOTE — ED Notes (Signed)
PRN ativan given because reported she was freaking out.
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
## 2022-04-20 NOTE — ED Notes (Signed)
PT now has put on purple scrub top and blue bottoms.

## 2022-04-20 NOTE — ED Notes (Signed)
Patient refusing her Vital Signs, states "I don't want her to touch me, I just dont want to be touched."

## 2022-04-20 NOTE — ED Notes (Signed)
PT refusing vitals °
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
## 2022-04-20 NOTE — ED Notes (Signed)
Pt requesting pain med 

## 2022-04-20 NOTE — ED Notes (Signed)
Pt refuses hospital clothing . Pt is wearing a bizarre clothing wraped in blanket, Pt is cleaning her room as if she is living in her own home. Pt stripped her bed of linen and tossed  sheets in hall.

## 2022-04-21 DIAGNOSIS — Z046 Encounter for general psychiatric examination, requested by authority: Secondary | ICD-10-CM | POA: Diagnosis not present

## 2022-04-21 DIAGNOSIS — F259 Schizoaffective disorder, unspecified: Secondary | ICD-10-CM | POA: Diagnosis not present

## 2022-04-21 DIAGNOSIS — R443 Hallucinations, unspecified: Secondary | ICD-10-CM

## 2022-04-21 DIAGNOSIS — F250 Schizoaffective disorder, bipolar type: Secondary | ICD-10-CM | POA: Diagnosis not present

## 2022-04-21 LAB — WET PREP, GENITAL
Clue Cells Wet Prep HPF POC: NONE SEEN
Sperm: NONE SEEN
Trich, Wet Prep: NONE SEEN
WBC, Wet Prep HPF POC: <10 (ref <10)
Yeast Wet Prep HPF POC: NONE SEEN

## 2022-04-21 LAB — HIV ANTIBODY (ROUTINE TESTING W REFLEX): HIV Screen 4th Generation wRfx: NONREACTIVE

## 2022-04-21 MED ORDER — DOXYCYCLINE HYCLATE 100 MG PO TABS
100.0000 mg | ORAL_TABLET | Freq: Two times a day (BID) | ORAL | Status: AC
  Administered 2022-04-21 – 2022-04-27 (×14): 100 mg via ORAL
  Filled 2022-04-21 (×14): qty 1

## 2022-04-21 MED ORDER — CEFTRIAXONE SODIUM 500 MG IJ SOLR
500.0000 mg | Freq: Once | INTRAMUSCULAR | Status: AC
  Administered 2022-04-21: 500 mg via INTRAMUSCULAR
  Filled 2022-04-21: qty 500

## 2022-04-21 MED ORDER — STERILE WATER FOR INJECTION IJ SOLN
INTRAMUSCULAR | Status: AC
  Filled 2022-04-21: qty 10

## 2022-04-21 MED ORDER — LIDOCAINE HCL (PF) 1 % IJ SOLN
1.0000 mL | Freq: Once | INTRAMUSCULAR | Status: AC
  Administered 2022-04-21: 1 mL
  Filled 2022-04-21: qty 5

## 2022-04-21 NOTE — ED Notes (Signed)
Pt refusing vital signs. Pt states, "I haven't finished my nap." Pt is sitting up in bed with eyes closed.

## 2022-04-21 NOTE — ED Notes (Signed)
Vaginal exam witnessed by this RN performed by Dr. Lynelle Doctor.

## 2022-04-21 NOTE — ED Notes (Addendum)
Pt is yelling at staff and to herself in room but voluntarily allows staff to provide geodon IM for agitation. Continues to refuse vital signs

## 2022-04-21 NOTE — ED Notes (Signed)
Per lab, Hsv culture and Typing can only be done on pap smear sample. MD notified.

## 2022-04-21 NOTE — ED Notes (Signed)
Pt calling out repeatedly requesting for doctor to "check her vagina." Pt reports burning. Will notify MD.
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
## 2022-04-21 NOTE — Progress Notes (Cosign Needed)
[facility]  04/21/2022 7:12 PM Molly BorgJessica [name]  MRN:  [id number]  Principal Problem: Schizoaffective disorder [facility](HCC) Diagnosis:  Principal Problem:   Schizoaffective disorder (HCC) Active Problems:   Involuntary commitment   Hallucinations   ED Assessment Time Calculation: Start Time: 1300 Stop Time: 1320 Total Time in Minutes (Assessment Completion): 20  Molly BorgJessica [name], 36 year old female, re-evaluated face to face per TTS consult, ongoing psychiatric evaluation while [facility].   On evaluation today, Molly [name] continues to request a new Child psychotherapistsocial worker, new payee, she transitions to tell me that she was having vaginal discomfort and eye doctor checked her out and told her that she has an oral herpes and provided her with an injection.  Molly [name] states. "  I do not feel any better".  On chart review patient did undergo a vaginal exam by EDP after complaining of vaginal discomfort.  A wet prep was completed along with STI testing.    Molly [name] is dressed in bed garments which are wrapped around her head and her body.  Her scrubs which are worn on the unit are taken off and draped at the head of her bed.  Molly [name] continues to deny suicidal, homicidal ideations although today she endorses not feeling well.  When this writer inquired about what not feeling well meant and if she was depressed patient admits to feeling depressed.  TTS provider yesterday increased patient's olanzapine dosage to 5 mg in the day and 50 mg at bedtime for total of 20 mg daily.  Patient will receive that entire dose today therefore TTS will reassess tomorrow to see if depressive symptoms have stabilized.  [facility]  Patient continues to meet inpatient psychiatric treatment criteria.  CSW will continue to work on placement.  [place] Scale:  Flowsheet Row ED [facility] from 04/08/2022 [facility] from 04/06/2022 in [facility]  C-SSRS RISK CATEGORY No Risk No Risk No Risk       Past Medical History:  Past Medical History:  Diagnosis Date   Abnormal Pap smear    ASC-cannot exclude HGSIL on Pap 02/15/2012   ASC-US on 02/03/2012 pap (associated Trichomonas infection). No reflex HPV testing performed on specimen.  Patient informed that she will need repeat Pap in one year.       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 Dennie Bibleichard Larsen (Psych) At [facility] in  [place], [place]. 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   Qualifier: Diagnosis of  By: McDiarmid MD, Benjaman Pottodd     TOBACCO USER 02/08/2009   Qualifier: Diagnosis of  By: Knox Royaltydell, Erin      Past Surgical History:  Procedure Laterality Date   INCISION AND DRAINAGE  pilanodal cyst   TIBIA IM NAIL INSERTION Right 10/26/2021   Procedure: INTRAMEDULLARY (IM) NAIL TIBIAL;  Surgeon: Myrene Galas, MD;  Location: [facility] OR;  Service: Orthopedics;  Laterality: Right;   TOOTH EXTRACTION N/A 10/07/2017   Procedure: EXTRACTION TEETH NUMBERS ONE, SEVENTEEN, NINETEEN AND THIRTY TWO;  Surgeon: Ocie Doyne, DDS;  Location: [facility] OR;  Service: Oral Surgery;  Laterality: N/A;   Family History:  Family History  Adopted: Yes  Problem Relation Age of Onset   Bipolar disorder Sister    Alcohol abuse Brother    Cancer Father    Diabetes  Mother   Social History:  Social History   Substance and Sexual Activity  Alcohol Use No   Comment: occ     Social History   Substance and Sexual Activity  Drug Use No    Social History   Socioeconomic History   Marital status: Single    Spouse name: Not on file   Number of children: Not on file   Years of education: Not on file   Highest education level: Not on file  Occupational History   Not on file  Tobacco Use   Smoking status: Every Day    Packs/day: 2.00    Years: 10.00    Total pack years: 20.00    Types: Cigarettes   Smokeless tobacco: Never  Vaping Use   Vaping Use: Never used  Substance and Sexual Activity   Alcohol use: No    Comment: occ   Drug use: No   Sexual activity: Yes    Birth control/protection: Injection  Other Topics Concern   Not on file  Social History Narrative   Adopted   Has Guardian   Living in [place] with Bradly Chris   Transportation: Bus   Social Determinants of Health   Financial Resource Strain: Not on file  Food Insecurity: Not on file  Transportation Needs: Not on file  Physical Activity: Not on file  Stress: Not on file  Social Connections: Not on file   Current Medications: Current Facility-Administered Medications  Medication Dose Route Frequency Provider Last Rate Last Admin   benztropine (COGENTIN) tablet 1 mg  1 mg Oral BID Gloris Manchester, MD   1 mg at 04/21/22 0935   divalproex (DEPAKOTE) DR tablet 1,000 mg  1,000 mg Oral BID Eligha Bridegroom, NP   1,000 mg at 04/21/22 0934   doxycycline (VIBRA-TABS) tablet 100 mg  100 mg Oral Penne Lash, MD   100 mg at 04/21/22 1203   LORazepam (ATIVAN) tablet 1 mg  1 mg Oral Q6H PRN Eligha Bridegroom, NP   1 mg at 04/21/22 1235   Or   LORazepam (ATIVAN) injection 2 mg  2 mg Intramuscular Q6H PRN Eligha Bridegroom, NP       nicotine polacrilex (NICORETTE) gum 2 mg  2 mg Oral PRN Pricilla Loveless, MD   2 mg at 04/19/22 1502   OLANZapine (ZYPREXA) tablet 15 mg  15 mg  Oral QHS Bing Neighbors, FNP   15 mg at 04/20/22 2111   OLANZapine zydis (ZYPREXA) disintegrating tablet 5 mg  5 mg Oral Daily Eligha Bridegroom, NP   5 mg at 04/21/22 0935   ziprasidone (GEODON) injection 20 mg  20 mg Intramuscular Daily PRN Bing Neighbors, FNP   20 mg at 04/20/22 7412   Current Outpatient Medications  Medication Sig Dispense Refill   benztropine (COGENTIN) 1 MG tablet Take 1 mg by mouth 2 (two) times daily.  divalproex (DEPAKOTE) 500 MG DR tablet Take 1 tablet (500 mg total) by mouth 2 (two) times daily for 7 days. (Patient taking differently: Take 1,000-1,500 mg by mouth See admin instructions. 1000 mg in the morning, 1500 mg in the evening) 14 tablet 0   haloperidol (HALDOL) 10 MG tablet Take 1 tablet (10 mg total) by mouth 2 (two) times daily. 60 tablet 0   lisinopril (ZESTRIL) 5 MG tablet Take 5 mg by mouth daily.     OLANZapine (ZYPREXA) 10 MG tablet Take 1 tablet (10 mg total) by mouth daily for 7 days. (Patient taking differently: Take 10 mg by mouth 2 (two) times daily.) 7 tablet 0   paliperidone (INVEGA SUSTENNA) 156 MG/ML SUSY injection Inject 156 mg into the muscle once. (Patient not taking: Reported on 04/13/2022)      Lab Results:  Results for orders placed or performed during the hospital encounter of 04/10/22 (from the past 48 hour(s))  Valproic acid level     Status: Abnormal   Collection Time: 04/19/22 10:30 PM  Result Value Ref Range   Valproic Acid Lvl 45 (L) 50.0 - 100.0 ug/mL    Comment: Performed at [facility], [address], [place], [place] [address]  Wet prep, genital     Status: None   Collection Time: 04/21/22 12:20 PM  Result Value Ref Range   Yeast Wet Prep HPF POC NONE SEEN NONE SEEN   Trich, Wet Prep NONE SEEN NONE SEEN   Clue Cells Wet Prep HPF POC NONE SEEN NONE SEEN   WBC, Wet Prep HPF POC <10 <10   Sperm NONE SEEN     Comment: Performed at [facility], [address], [place], [place] [address]  HIV Antibody  (routine testing w rflx)     Status: None   Collection Time: 04/21/22  1:24 PM  Result Value Ref Range   HIV Screen 4th Generation wRfx Non Reactive Non Reactive    Comment: Performed at [facility], [address], [place], [place] [address]    Blood Alcohol level:  Lab Results  Component Value Date   [facility] [place] [facility] <10 04/01/2022   ETH <10 01/18/2022    Physical Findings:   Psychiatric Specialty Exam:  Presentation  General Appearance:  Bizarre; Other (comment) (All things is all patient is draped and dressed in bed linen)  Eye Contact: Fair  Speech: Clear and Coherent  Speech Volume: Normal  Handedness: Right   Mood and Affect  Mood: Labile; Irritable; Depressed; Angry  Affect: Full Range; Labile; Tearful   Thought Process  Thought Processes: Disorganized  Descriptions of Associations:Tangential  Orientation:Full (Time, Place and Person)  Thought Content:Illogical; Tangential  History of Schizophrenia/Schizoaffective disorder:Yes  Duration of Psychotic Symptoms:Greater than six months  Hallucinations:Hallucinations: None  Ideas of Reference:None  Suicidal Thoughts:Suicidal Thoughts: No  Homicidal Thoughts:Homicidal Thoughts: No   Sensorium  Memory: Immediate Fair; Recent Fair  Judgment: Impaired  Insight: Lacking   Executive Functions  Concentration: Poor  Attention Span: Poor  Recall: Fair  Fund of Knowledge: Fair  Language: Fair   Psychomotor Activity  Psychomotor Activity:Psychomotor Activity: Normal   Assets  Assets: Resilience; Physical Health; Social Support   Sleep  Sleep:Sleep: Fair    Physical Exam: Physical Exam HENT:     Head: Normocephalic.  Eyes:     Extraocular Movements: Extraocular movements intact.     Pupils: Pupils are equal, round, and reactive to light.  Cardiovascular:     Rate  and Rhythm: Normal rate and regular rhythm.  Pulmonary:     Effort: Pulmonary effort is normal.      Breath sounds: Normal breath sounds.  Skin:    Capillary Refill: Capillary refill takes less than 2 seconds.  Neurological:     General: No focal deficit present.     Mental Status: She is alert.    Review of Systems  Psychiatric/Behavioral:  Positive for depression. Negative for hallucinations, substance abuse and suicidal ideas. The patient is not nervous/anxious and does not have insomnia.    Blood pressure 131/87, pulse 98, temperature 97.7 F (36.5 C), temperature source Oral, resp. rate 18, height 5\' 8"  (1.727 m), weight 123.8 kg, SpO2 99 %, unknown if currently breastfeeding. Body mass index is 41.5 kg/m.   Medical Decision Making: Patient case review and discussed with Dr. , patient continues to meet criteria for inpatient psychiatric treatment.  Patient is unable to contract for safety at this time. Patient is currently waiting [facility] at [facility].  Patient is on the [facility] waitlist.  EDP, RN, [name], notified of disposition.   Continue with current medication regimen. No changes were made today.  [facility], FNP-C, PMHNP-BC  04/21/2022, 7:12 PM

## 2022-04-21 NOTE — ED Notes (Signed)
Pt out of room repeatedly requesting staff to call her brother to come here and cut her stomach open. Pt is concerned that she is pregnant and wants the baby out. Pt is not redirectable, yelling at staff, making paranoid statements such as "Why did you tell Marcelino Duster that that black woman can't have shit? Am I the black woman you were talking about?" Security called to purple zone to assist with deescalating patient. Pt went back to room and sat down on bed. Lights dimmed per patient request. Security remains near.

## 2022-04-21 NOTE — ED Notes (Signed)
Pt provided with cup of tea per request.

## 2022-04-21 NOTE — ED Notes (Signed)
Phlebotomy called to obtain labs

## 2022-04-21 NOTE — Progress Notes (Signed)
CSW spoke with [facility] about pt status. Pt is still on waitlist for inpatient treatment at [facility]. CSW will continue to assist and follow with placement.  Cathie Beams, [place]  04/21/2022 10:45 AM

## 2022-04-21 NOTE — ED Notes (Addendum)
Initially, pt was open to self swabbing for herpes. Upon reassess at this time, becomes angry stating "why is that doctor telling everyone that I have genital warts? Those are just my nipples on my vagina. Those are my nipples!"   Pt becomes verbally aggressive towards staff, takes the ativan offered orally from PRN list but then cheeks the medication and refuses to show nursing staff underside of her tongue or inside her cheek. Ignores nursing staff directions and then begins yelling profanities. Security called and geodon to be given IM.

## 2022-04-21 NOTE — ED Notes (Signed)
Pt came out of room while another patient came out of their room to throw tray away, took a half eaten brownie off their tray, ate it, then went back to room. Multiple staff telling pt not to do this.

## 2022-04-22 DIAGNOSIS — F259 Schizoaffective disorder, unspecified: Secondary | ICD-10-CM | POA: Diagnosis not present

## 2022-04-22 DIAGNOSIS — F250 Schizoaffective disorder, bipolar type: Secondary | ICD-10-CM | POA: Diagnosis not present

## 2022-04-22 LAB — GC/CHLAMYDIA PROBE AMP (~~LOC~~) NOT AT ARMC
Chlamydia: NEGATIVE
Comment: NEGATIVE
Comment: NORMAL
Neisseria Gonorrhea: NEGATIVE

## 2022-04-22 LAB — RPR: RPR Ser Ql: NONREACTIVE

## 2022-04-22 MED ORDER — QUETIAPINE FUMARATE 100 MG PO TABS
100.0000 mg | ORAL_TABLET | ORAL | Status: AC
  Administered 2022-04-22: 100 mg via ORAL
  Filled 2022-04-22: qty 1

## 2022-04-22 NOTE — ED Notes (Signed)
Pt sitting up, eating the dinner tray provided.

## 2022-04-22 NOTE — ED Notes (Signed)
Snack given to Pt along with something to drink

## 2022-04-22 NOTE — Progress Notes (Signed)
[facility] Psych ED Progress Note  04/22/2022 2:05 PM Molly [name]  MRN:  [id number]   Subjective:  "I need a new social worker" Principal Problem: Schizoaffective disorder (HCC) Diagnosis:  Principal Problem:   Schizoaffective disorder (HCC) Active Problems:   Involuntary commitment   Hallucinations  ED Assessment Time Calculation: Start Time: 1020 Stop Time: 1040 Total Time in Minutes (Assessment Completion): 20   Molly [name], is a 36 year old, female, re-evaluated face to face per TTS consult, for ongoing psychiatric evaluation while awaiting placement at [facility] inpatient facility.     On evaluation today, Molly [name] appears calm and is sitting on the edge of her bed. She reports a "little bit depressed" when asked if she was still feeling depressed which she reported yesterday. Endorses sleeping well. She transition to ask if this Clinical research associate was her new "Child psychotherapist". Jaya goes on the report that she is being "distributed in [place]" . [name] continues to utilize bed linen as clothing today. She continues to deny suicidal or homicidal ideations. [name] denies auditory or visual hallucinations.    Molly [name] has remained less agitated over the last 24 hours since adjustments were made to olanzapine 5 mg during morning and 15 mg at bedtime. She also reports slight improvement of her depression symptoms. Patient remains unable to reliably contract for safety and continues to meet inpatient psychiatric treatment criteria.  TTS will continue evaluate patient daily . [name] remains on the [facility]  waitlist.   [place] Scale:  Flowsheet Row ED from 04/10/2022 in [facility] EMERGENCY DEPART[facility] ED from 04/08/2022 in [facility] from 04/06/2022 in [facility]  C-SSRS RISK CATEGORY No Risk No Risk No Risk       Past Medical History:  Past Medical History:  Diagnosis Date   Abnormal Pap smear    ASC-cannot exclude  HGSIL on Pap 02/15/2012   ASC-US on 02/03/2012 pap (associated Trichomonas infection). No reflex HPV testing performed on specimen.  Patient informed that she will need repeat Pap in one year.       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 Dennie Bible (Psych) At [facility] in  [place], [place]. 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   Qualifier: Diagnosis of  By: McDiarmid MD, Benjaman Pott USER 02/08/2009   Qualifier: Diagnosis of  By: Knox Royalty      Past Surgical History:  Procedure Laterality Date   INCISION AND DRAINAGE     pilanodal cyst   TIBIA IM NAIL INSERTION Right 10/26/2021   Procedure: INTRAMEDULLARY (IM) NAIL TIBIAL;  Surgeon: Myrene Galas, MD;  Location: [facility] OR;  Service: Orthopedics;  Laterality: Right;   TOOTH EXTRACTION N/A 10/07/2017   Procedure: EXTRACTION TEETH NUMBERS ONE, SEVENTEEN, NINETEEN AND THIRTY TWO;  Surgeon: Ocie Doyne, DDS;  Location: [facility] OR;  Service: Oral Surgery;  Laterality: N/A;   Family History:  Family History  Adopted: Yes  Problem Relation Age of Onset   Bipolar disorder Sister    Alcohol abuse Brother    Cancer Father    Diabetes Mother     Social History:  Social History   Substance and Sexual Activity  Alcohol Use No   Comment: occ     Social History   Substance and Sexual Activity  Drug Use No    Social History   Socioeconomic History   Marital status: Single    Spouse name: Not  on file   Number of children: Not on file   Years of education: Not on file   Highest education level: Not on file  Occupational History   Not on file  Tobacco Use   Smoking status: Every Day    Packs/day: 2.00    Years: 10.00    Total pack years: 20.00    Types: Cigarettes   Smokeless tobacco: Never  Vaping Use   Vaping Use: Never used  Substance and Sexual Activity   Alcohol use: No    Comment: occ   Drug use: No   Sexual activity: Yes    Birth control/protection: Injection  Other Topics Concern   Not on file  Social History Narrative   Adopted   Has Guardian   Living in [place] home with [name]   Transportation: Bus   Social Determinants of Health   Financial Resource Strain: Not on file  Food Insecurity: Not on file  Transportation Needs: Not on file  Physical Activity: Not on file  Stress: Not on file  Social Connections: Not on file   Current Medications: Current Facility-Administered Medications  Medication Dose Route Frequency Provider Last Rate Last Admin   benztropine (COGENTIN) tablet 1 mg  1 mg Oral BID Godfrey Pick, MD   1 mg at 04/22/22 0817   divalproex (DEPAKOTE) DR tablet 1,000 mg  1,000 mg Oral BID Vesta Mixer, NP   1,000 mg at 04/22/22 0817   doxycycline (VIBRA-TABS) tablet 100 mg  100 mg Oral Q12H Dorie Rank, MD   100 mg at 04/22/22 0817   LORazepam (ATIVAN) tablet 1 mg  1 mg Oral Q6H PRN Vesta Mixer, NP   1 mg at 04/21/22 2005   Or   LORazepam (ATIVAN) injection 2 mg  2 mg Intramuscular Q6H PRN Vesta Mixer, NP       nicotine polacrilex (NICORETTE) gum 2 mg  2 mg Oral PRN Sherwood Gambler, MD   2 mg at 04/22/22 1334   OLANZapine (ZYPREXA) tablet 15 mg  15 mg Oral QHS Scot Jun, FNP   15 mg at 04/21/22 2101   OLANZapine zydis (ZYPREXA) disintegrating tablet 5 mg  5 mg Oral Daily Vesta Mixer, NP   5 mg at 04/22/22 0817   ziprasidone (GEODON) injection 20 mg  20 mg Intramuscular Daily PRN Scot Jun, FNP    20 mg at 04/21/22 2010   Current Outpatient Medications  Medication Sig Dispense Refill   benztropine (COGENTIN) 1 MG tablet Take 1 mg by mouth 2 (two) times daily.     divalproex (DEPAKOTE) 500 MG DR tablet Take 1 tablet (500 mg total) by mouth 2 (two) times daily for 7 days. (Patient taking differently: Take 1,000-1,500 mg by mouth See admin instructions. 1000 mg in the morning, 1500 mg in the evening) 14 tablet 0   haloperidol (HALDOL) 10 MG tablet Take 1 tablet (10  mg total) by mouth 2 (two) times daily. 60 tablet 0   lisinopril (ZESTRIL) 5 MG tablet Take 5 mg by mouth daily.     OLANZapine (ZYPREXA) 10 MG tablet Take 1 tablet (10 mg total) by mouth daily for 7 days. (Patient taking differently: Take 10 mg by mouth 2 (two) times daily.) 7 tablet 0   paliperidone (INVEGA SUSTENNA) 156 MG/ML SUSY injection Inject 156 mg into the muscle once. (Patient not taking: Reported on 04/13/2022)      Lab Results:  Results for orders placed or performed during the hospital encounter of 04/10/22 (from the past 48 hour(s))  Wet prep, genital     Status: None   Collection Time: 04/21/22 12:20 PM  Result Value Ref Range   Yeast Wet Prep HPF POC NONE SEEN NONE SEEN   Trich, Wet Prep NONE SEEN NONE SEEN   Clue Cells Wet Prep HPF POC NONE SEEN NONE SEEN   WBC, Wet Prep HPF POC <10 <10   Sperm NONE SEEN     Comment: Performed at [facility] [address] [place], [place] [address]  RPR     Status: None   Collection Time: 04/21/22  1:24 PM  Result Value Ref Range   RPR Ser Ql NON REACTIVE NON REACTIVE    Comment: Performed at [facility] [address] [place], [place] [address]  HIV Antibody (routine testing w rflx)     Status: None   Collection Time: 04/21/22  1:24 PM  Result Value Ref Range   HIV Screen 4th Generation wRfx Non Reactive Non Reactive    Comment: Performed at [facility], [place] [address], [place], [place] [address]    Blood Alcohol level:  Lab Results   Component Value Date   [facility] <10 04/01/2022   ETH <10 01/18/2022    Physical Exam: Physical Exam Vitals reviewed.  Constitutional:      Appearance: Normal appearance.  HENT:     Head: Normocephalic and atraumatic.  Eyes:     Extraocular Movements: Extraocular movements intact.     Pupils: Pupils are equal, round, and reactive to light.  Cardiovascular:     Rate and Rhythm: Tachycardia present.  Pulmonary:     Effort: Pulmonary effort is normal.     Breath sounds: Normal breath sounds.  Musculoskeletal:        General: Normal range of motion.     Cervical back: Normal range of motion.  Neurological:     General: No focal deficit present.     Mental Status: She is alert.    Review of Systems  Psychiatric/Behavioral:  Positive for depression. Negative for hallucinations, memory loss, substance abuse and suicidal ideas. The patient is not nervous/anxious and does not have insomnia.    Blood pressure (!) 124/108, pulse 100, temperature 98.4 F (36.9 C), temperature source Oral, resp. rate 20, height 5\' 8"  (1.727 m), weight 123.8 kg, SpO2 98 %, unknown if currently breastfeeding. Body mass index is 41.5 kg/m.   Medical Decision Making: Patient case review and discussed with Dr. Leverne Humbles, patient continues to meet criteria for inpatient psychiatric treatment.  Patient is unable to reliably contract for safety at this time. Patient remains on the [facility] wait list. Discussed with CSW to inquire about priority placement on the [facility] wait list.  EDP, RN, [name], notified of disposition.    Molli Barrows, FNP-C, PMHNP-BC  04/22/2022, 2:05 PM

## 2022-04-22 NOTE — ED Notes (Signed)
Pt yelling at this nurse, demanding to check her blood sugar cause it feels low. Pt has no dx of diabetes and does not show signs of hypoglycemia. Pt just ate entire dinner try, plus [place] sandwich. Pt is alert to baseline; sitting up on the side of the bed drinking decaf coffee. Pt also yelling " Can you add Eston Esters and Edwena Felty to my chart for family contact. He adopted me, in charge of my bank accounts  and will be picking me up."

## 2022-04-22 NOTE — ED Notes (Signed)
Pt's dinner has arrived, pt currently sleeping. Pt's dinner left on nurse's station

## 2022-04-22 NOTE — ED Provider Notes (Signed)
Emergency Medicine Observation Re-evaluation Note  Molly [name] is a 36 y.o. female, seen on rounds today.  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  Physical Exam  BP 131/87 (BP Location: Left Arm)   Pulse 98   Temp 97.7 F (36.5 C) (Oral)   Resp 18   Ht 5\' 8"  (1.727 m)   Wt 123.8 kg   SpO2 99%   BMI 41.50 kg/m  Physical Exam General: No acute distress Cardiac: Regular rate Lungs: no distress Psych: rambling train of thought  ED Course / MDM  EKG:   I have reviewed the labs performed to date as well as medications administered while in observation.  Recent changes in the last 24 hours include agitated at times yesterday, needing sedation. Had pelvic exam. RPR and g/c pending.  Plan  Current plan is for psychiatric hospitalization.    , MD 04/22/22 [phone]

## 2022-04-22 NOTE — ED Notes (Signed)
Care assumed for pt.  Pt is noted to be walking around the unit.  No distress noted.  Calm at this time.

## 2022-04-22 NOTE — ED Notes (Signed)
Pt crying out loudly and screaming.  Attempted to ask pt to calm her voice and she states "Get the f*ck out of here b*tch!" PRN medication administered as ordered.

## 2022-04-22 NOTE — ED Notes (Signed)
IVC, refused vitals @2200 

## 2022-04-22 NOTE — ED Notes (Signed)
Pt yelling "fuck you. My parents didn't do shit for me. That's why the [facility]. Go get my water since that officer is trying to arrest me."

## 2022-04-22 NOTE — Progress Notes (Signed)
CSW received phone call from [facility] admissions. Pt remains on waitlist for inpatient psychiatric treatment. CSW will continue to assist and follow with placement. 

## 2022-04-22 NOTE — ED Notes (Signed)
Snack bag and drink provided 

## 2022-04-22 NOTE — ED Notes (Signed)
IVC'd 04/17/22, exp. 04/24/22; IVC docs in [place].

## 2022-04-22 NOTE — ED Notes (Signed)
Pt is up, walking around unit, hospital supplies in the trash, asking for additional gloves/mask/linens. Pt encouraged to stay in her room but continues to walk to bathroom and other peoples room. Pt is agitated and requesting items she knows are not allowed in this unit.

## 2022-04-23 ENCOUNTER — Ambulatory Visit: Payer: 59 | Admitting: Internal Medicine

## 2022-04-23 DIAGNOSIS — F259 Schizoaffective disorder, unspecified: Secondary | ICD-10-CM

## 2022-04-23 MED ORDER — STERILE WATER FOR INJECTION IJ SOLN
INTRAMUSCULAR | Status: AC
  Filled 2022-04-23: qty 10

## 2022-04-23 MED ORDER — HALOPERIDOL LACTATE 5 MG/ML IJ SOLN
5.0000 mg | Freq: Once | INTRAMUSCULAR | Status: AC
  Administered 2022-04-23: 5 mg via INTRAMUSCULAR
  Filled 2022-04-23: qty 1

## 2022-04-23 MED ORDER — LORAZEPAM 2 MG/ML IJ SOLN
2.0000 mg | Freq: Once | INTRAMUSCULAR | Status: DC
  Filled 2022-04-23: qty 1

## 2022-04-23 NOTE — ED Provider Notes (Signed)
Emergency Medicine Observation Re-evaluation Note  Molly [name] is a 36 y.o. female, seen on rounds today.  Pt initially presented to the ED for complaints of Medical Clearance Currently, the patient is asleep.  Physical Exam  BP (!) 157/99 (BP Location: Left Arm)   Pulse (!) 110   Temp (!) 97.5 F (36.4 C) (Oral)   Resp 20   Ht 5\' 8"  (1.727 m)   Wt 123.8 kg   SpO2 98%   BMI 41.50 kg/m  Physical Exam General: Asleep Cardiac: Regular rate Lungs: no distress Psych: sleep  ED Course / MDM  EKG:   I have reviewed the labs performed to date as well as medications administered while in observation.  Recent changes in the last 24 hours include agitated at times yesterday. Had pelvic exam 04/21/22, and RPR, G/C, wet prep, HIV were all negative.   Plan  Current plan is for psychiatric hospitalization.     04/23/22, MD 04/23/22 [phone]

## 2022-04-23 NOTE — ED Notes (Signed)
Pt refused to talk to nurse at this time. Pt cooperative with med administration.

## 2022-04-23 NOTE — ED Notes (Signed)
Pt hit distress button on wall. Educated pt that she is not to touch that button. Pt sitting back on bed silent.

## 2022-04-23 NOTE — ED Notes (Signed)
Pt yelling and cursing at staff. Slammed door. Pt then screamed in room. Security contacted.

## 2022-04-23 NOTE — ED Notes (Signed)
TTS in process 

## 2022-04-23 NOTE — ED Notes (Addendum)
Pt slamming door to room. Educated pt that room door needs to be open for safety purposed. Pt pushed tray into door. Security at bedside. Pt cursing at staff.

## 2022-04-23 NOTE — ED Notes (Signed)
This nurse attempted to administer the PRN Seroquel. After pt put the pill in her mouth, she began spitting in a cup. When this nurse asked to see the cup pt refused to give it. Pt states " you should know if I took the pill or not"

## 2022-04-23 NOTE — ED Notes (Signed)
Pt his hollering and crying hysterically. This RN attempted to assess the pt. Pt continued to yell and scream but unable to provide a reason for the crying. Pt proceeds to get up and slam the door.

## 2022-04-23 NOTE — Consult Note (Signed)
Telepsych Consultation   Reason for Consult:  Psychiatric Reassessment  Referring Physician:  ED Provider [facility] Other: virtual home office  Patient Identification: Molly BorgJessica [name] MRN:  [id number] Principal Diagnosis: Schizoaffective disorder (HCC) Diagnosis:  Principal Problem:   Schizoaffective disorder (HCC) Active Problems:   Involuntary commitment   Hallucinations   Total Time spent with patient: 30 minutes  Subjective:   Molly [name] is a 36 y.o. female patient admitted with decompensated schizoaffective disorder and medication non-compliance. Patient is currently boarded in the emergency room awaiting [facility] acceptance.  She is followed by psychiatry and seen daily for assessment and medication management as needed.    HPI:   Patient seen via telepsych by this provider; chart reviewed and consulted with Dr. Lucianne MussKumar on 04/23/22.  On evaluation Molly [name] reports is seen sitting dangled at the bedside with a sheet wrapped around her head; she is dressed in multiple hospital sheets, fitted and flat.  She makes direct eye contact and appropriately greets this Clinical research associatewriter.  She is alert and oriented to her name and location, does not state the date although asked a few times.  When asked if she wanted scrubs she states she's wearing scrub pants but did not want the scrub top because, "it stinks and I had to wash it out and hang it up to dry."  Reports she slept good last night; states she's been taking the medications that nurses give her but she believes they are the wrong medications.  When asked what meds she should be taking, pt states,"depo provera, depakote, olanzapine, invega, and haldol."  This Clinical research associatewriter reviewed ordered medications with patient to which she nodded her head in agreement.  Pt reports her appetite is good, "I had a cheeseburger and fries for dinner."  Also states her sleep is good.   Patient reports she is not suicidal or  homicidal but states she hears voices that sound like butterflies.  She denies command hallucinations.  Patient asks this Clinical research associatewriter, "are you a Child psychotherapistsocial worker?"  Pt states she wants to speak with a Child psychotherapistsocial worker about housing and her check.  I discussed with Ms. Jennette Kettleeal my role in medication management and asked if there was anything I could help with.  Ms. Jennette Kettleeal responded by stating she was told she would have to take medications for the rest of her life.  She also reports she's been crying a lot, "crying because of the color of my skin; crying because of people in relationships; crying because I'm just crying. Can you give me medications for that?"  Engaged in empathetic listening, encouragement and therapeutic support offered.  Informed pt her meds would be reviewed. When asked if she had any additional questions or concerns patient said the hospital should use gain to wash the clothes.  She reports she uses gain to bath with and it does not bother her skin.    Per nursing notes, within the past 24 hours, patient's mood has been impulsive and varies from being cooperative to being verbally disruptive; earlier today she became upset and slammed her room door.  Security was called but pt was able to calm down without receiving prn agitation medications.    During evaluation Molly BorgJessica [name] is seated at the bedside; dangled; She is alert/oriented x 2; Pt is calm/cooperative; and mood congruent with affect.  Patient is speaking in a clear tone at moderate volume, and normal pace; with good eye contact.  Her thought  process is mostly relevant; There is no indication that she is currently responding to internal/external stimuli.  Patient denies suicidal/self-harm/homicidal ideation.  Patient has remained calm throughout assessment and has answered questions appropriately.    Past Psychiatric History: Schizoaffective Disorder  Risk to Self:  yes Risk to Others:  yes Prior Inpatient Therapy:  yes,  Prior Outpatient  Therapy:  unsure  Past Medical History:  Past Medical History:  Diagnosis Date   Abnormal Pap smear    ASC-cannot exclude HGSIL on Pap 02/15/2012   ASC-US on 02/03/2012 pap (associated Trichomonas infection). No reflex HPV testing performed on specimen.  Patient informed that she will need repeat Pap in one year.       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 Dennie Bible (Psych) At [facility] in  [place], [place]. 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   Qualifier: Diagnosis of  By: McDiarmid MD, Benjaman Pott USER 02/08/2009   Qualifier: Diagnosis of  By: Knox Royalty      Past Surgical History:  Procedure Laterality Date   INCISION AND DRAINAGE     pilanodal cyst   TIBIA IM NAIL INSERTION Right 10/26/2021   Procedure: INTRAMEDULLARY (IM) NAIL TIBIAL;  Surgeon: Myrene Galas, MD;  Location: [facility] OR;  Service: Orthopedics;  Laterality: Right;   TOOTH EXTRACTION N/A 10/07/2017   Procedure: EXTRACTION TEETH NUMBERS ONE, SEVENTEEN, NINETEEN AND THIRTY TWO;  Surgeon: Ocie Doyne, DDS;  Location: [facility] OR;  Service: Oral Surgery;  Laterality: N/A;   Family History:  Family History  Adopted: Yes  Problem Relation Age of Onset   Bipolar disorder Sister    Alcohol abuse Brother    Cancer Father    Diabetes Mother    Family Psychiatric  History: deferred Social History:  Social History   Substance and Sexual Activity  Alcohol Use No   Comment:  occ     Social History   Substance and Sexual Activity  Drug Use No    Social History   Socioeconomic History   Marital status: Single    Spouse name: Not on file   Number of children: Not on file   Years of education: Not on file   Highest education level: Not on file  Occupational History   Not on file  Tobacco Use   Smoking status: Every Day    Packs/day: 2.00    Years: 10.00    Total pack years: 20.00    Types: Cigarettes   Smokeless tobacco: Never  Vaping Use   Vaping Use: Never used  Substance and Sexual Activity   Alcohol use: No    Comment: occ   Drug use: No   Sexual activity: Yes    Birth control/protection: Injection  Other Topics Concern   Not on file  Social History Narrative   Adopted   Has  Guardian   Living in Half-way home with Bradly Chris   Transportation: Bus   Social Determinants of Health   Financial Resource Strain: Not on file  Food Insecurity: Not on file  Transportation Needs: Not on file  Physical Activity: Not on file  Stress: Not on file  Social Connections: Not on file   Additional Social History:    Allergies:   Allergies  Allergen Reactions   Abilify [Aripiprazole] Other (See Comments)    Thinks it's nasty- does not want it.  Injection is ok.      Labs: No results found for this or any previous visit (from the past 48 hour(s)).  Medications:  Current Facility-Administered Medications  Medication Dose Route Frequency Provider Last Rate Last Admin   benztropine (COGENTIN) tablet 1 mg  1 mg Oral BID Gloris Manchester, MD   1 mg at 04/23/22 1009   divalproex (DEPAKOTE) DR tablet 1,000 mg  1,000 mg Oral BID Eligha Bridegroom, NP   1,000 mg at 04/23/22 1009   doxycycline (VIBRA-TABS) tablet 100 mg  100 mg Oral Penne Lash, MD   100 mg at 04/23/22 1009   LORazepam (ATIVAN) tablet 1 mg  1 mg Oral Q6H PRN Eligha Bridegroom, NP   1 mg at 04/22/22 2054   Or   LORazepam (ATIVAN) injection 2 mg  2 mg Intramuscular Q6H PRN Eligha Bridegroom, NP       LORazepam (ATIVAN) injection 2 mg  2 mg Intramuscular Once Mesner, Barbara Cower, MD       nicotine polacrilex (NICORETTE) gum 2 mg  2 mg Oral PRN Pricilla Loveless, MD   2 mg at 04/23/22 1127   OLANZapine (ZYPREXA) tablet 15 mg  15 mg Oral QHS Bing Neighbors, FNP   15 mg at 04/22/22 2053   OLANZapine zydis (ZYPREXA) disintegrating tablet 5 mg  5 mg Oral Daily Eligha Bridegroom, NP   5 mg at 04/23/22 1010   ziprasidone (GEODON) injection 20 mg  20 mg Intramuscular Daily PRN Bing Neighbors, FNP   20 mg at 04/23/22 1528   Current Outpatient Medications  Medication Sig Dispense Refill   benztropine (COGENTIN) 1 MG tablet Take 1 mg by mouth 2 (two) times daily.     divalproex (DEPAKOTE) 500 MG DR tablet Take 1 tablet (500 mg total) by mouth 2 (two) times daily for 7 days. (Patient taking differently: Take 1,000-1,500 mg by mouth See admin instructions. 1000 mg in the morning, 1500 mg in the evening) 14 tablet 0   haloperidol (HALDOL) 10 MG tablet Take 1 tablet (10 mg total) by mouth 2 (two) times daily. 60 tablet 0   lisinopril (ZESTRIL) 5 MG tablet Take 5 mg by mouth daily.     OLANZapine (ZYPREXA) 10 MG tablet Take 1 tablet (10 mg total) by mouth daily for 7 days. (Patient taking differently: Take 10 mg by mouth 2 (two) times daily.) 7 tablet 0   paliperidone (INVEGA SUSTENNA) 156 MG/ML SUSY injection Inject 156 mg into the muscle once. (Patient not taking: Reported on 04/13/2022)      Musculoskeletal: pt moves all exremities and ambulates independently.  Strength & Muscle Tone: within normal limits Gait & Station: normal Patient leans: N/A   Psychiatric Specialty Exam:  Presentation  General Appearance:  Bizarre; Disheveled  Eye Contact: Good  Speech: Clear and Coherent  Speech Volume: Normal  Handedness: Right   Mood and Affect  Mood: Anxious; Depressed  Affect: Congruent; Blunt   Thought Process  Thought  Processes: Irrevelant  Descriptions of  Associations:Tangential  Orientation:Partial  Thought Content:Illogical  History of Schizophrenia/Schizoaffective disorder:Yes  Duration of Psychotic Symptoms:Greater than six months  Hallucinations:Hallucinations: Auditory Description of Auditory Hallucinations: reports she hears butterflies Description of Visual Hallucinations: denies  Ideas of Reference:None  Suicidal Thoughts:Suicidal Thoughts: No  Homicidal Thoughts:Homicidal Thoughts: No   Sensorium  Memory: Immediate Fair; Recent Fair; Remote Fair  Judgment: Poor  Insight: Poor   Executive Functions  Concentration: Fair  Attention Span: Fair  Recall: Fair  Fund of Knowledge: Fair  Language: Good   Psychomotor Activity  Psychomotor Activity:Psychomotor Activity: Normal   Assets  Assets: Communication Skills; Financial Resources/Insurance   Sleep  Sleep:Sleep: Fair Number of Hours of Sleep: 6    Physical Exam: Physical Exam Vitals and nursing note reviewed.  Cardiovascular:     Rate and Rhythm: Normal rate.  Pulmonary:     Effort: Pulmonary effort is normal.  Musculoskeletal:        General: Normal range of motion.     Cervical back: Normal range of motion.  Neurological:     Mental Status: She is alert.  Psychiatric:        Attention and Perception: Attention and perception normal.        Mood and Affect: Mood is depressed. Affect is blunt.        Speech: Speech normal.        Behavior: Behavior is cooperative.        Cognition and Memory: Cognition is impaired.        Judgment: Judgment is impulsive.    Review of Systems  Constitutional: Negative.   HENT: Negative.    Eyes: Negative.   Respiratory: Negative.    Cardiovascular: Negative.   Gastrointestinal: Negative.   Genitourinary: Negative.   Musculoskeletal: Negative.   Skin: Negative.   Neurological: Negative.   Endo/Heme/Allergies: Negative.   Psychiatric/Behavioral:  Positive for depression. The patient is  nervous/anxious.    Blood pressure (!) 127/103, pulse (!) 104, temperature 98.5 F (36.9 C), temperature source Oral, resp. rate 19, height 5\' 8"  (1.727 m), weight 123.8 kg, SpO2 100 %, unknown if currently breastfeeding. Body mass index is 41.5 kg/m.  Treatment Plan Summary: Patient case discussed with Dr. ; patient continues to meet criteria for inpatient psychiatric treatment.  Patient is unable to reliably contract for safety at this time. Patient remains on the [facility] wait list.  While awaiting placement, psychiatry will continue to follow Ms. Friedt and make medication adjustments as needed.    Daily contact with patient to assess and evaluate symptoms and progress in treatment and Medication management  Continue medications as prescribed.   Disposition: Recommend psychiatric Inpatient admission when medically cleared.  This service was provided via telemedicine using a 2-way, interactive audio and video technology.  Names of all persons participating in this telemedicine service and their role in this encounter. Name: Kristiana Jacko  Role: Patient   Name: Molly [name] Role: PMHNP  Name: Ophelia Shoulder Role: Psychiatrist    Gretta Cool, NP 04/23/2022 8:49 PM

## 2022-04-23 NOTE — ED Notes (Signed)
Attempted to obtain pts VS, pt refused.

## 2022-04-24 DIAGNOSIS — F259 Schizoaffective disorder, unspecified: Secondary | ICD-10-CM | POA: Diagnosis not present

## 2022-04-24 MED ORDER — TRAZODONE HCL 50 MG PO TABS
50.0000 mg | ORAL_TABLET | Freq: Every evening | ORAL | Status: DC | PRN
  Administered 2022-04-24 – 2022-05-08 (×13): 50 mg via ORAL
  Filled 2022-04-24 (×14): qty 1

## 2022-04-24 NOTE — Progress Notes (Signed)
[facility] Psych ED Progress Note  04/24/2022 2:17 PM Molly [name]  MRN:  [id number]   Subjective:   Patient seen at [name], ED for face-to-face reevaluation. Pt last seen by this provider on 04/20/22 in which she refused to speak, appeared disheveled, smelled of urine, wouldn't respond to any questions asked. Valproic Acid level was subtherapeutic at 45 and Depakote was increased to 1,000 mg BID.   Per chart review, patient has been compliant with oral medications. Will reassess Valproic Acid level tonight prior to PM Depakote administration to assess levels prior to any further medication adjustments.   Upon assessment today patient is pleasant and willing to engage in conversation. She tells me she is still having difficulty sleeping, denies problems with appetite. She denies suicidal or homicidal thoughts. Denies auditory or visual hallucinations. She still has bizarre appearance, she is wearing clothes inappropriately and still needs hygiene encouraged by ED staff. However, her thought process is more intact, and not nearly as disorganized as previous assessment. Today she is focused on speaking with social work, will not disclose to me why and states that it is personal and only for the SW to know.   Patient has extensive history of schizoaffective disorder, noncompliance, psychosis, and homelessness. She has had many ED/UC and inpatient admissions due to psychosis and disorganization. She is followed up by ACT and OP follow up however remains noncompliant. I do feel at this time patient would benefit from [facility] admission. Will continue to recommend she remain on [facility] waitlist.   Principal Problem: Schizoaffective disorder (HCC) Diagnosis:  Principal Problem:   Schizoaffective disorder (HCC) Active Problems:   Involuntary commitment   Hallucinations   ED Assessment Time Calculation: Start Time: 1100 Stop Time: 1120 Total Time in Minutes (Assessment Completion): 20  [place] Scale:   Flowsheet Row ED from 04/10/2022 in [facility] EMERGENCY DEPARTMENT ED from 04/08/2022 in [facility] from 04/06/2022 in [facility]  C-SSRS RISK CATEGORY No Risk No Risk No Risk       Past Medical History:  Past Medical History:  Diagnosis Date   Abnormal Pap smear    ASC-cannot exclude HGSIL on Pap 02/15/2012   ASC-US on 02/03/2012 pap (associated Trichomonas infection). No reflex HPV testing performed on specimen.  Patient informed that she will need repeat Pap in one year.       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 Dennie Bible (Psych) At [facility] in  [place], [place]. 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   Qualifier: Diagnosis of  By: McDiarmid MD, Benjaman Pott USER 02/08/2009   Qualifier: Diagnosis of  By: Knox Royalty      Past Surgical History:  Procedure Laterality Date   INCISION AND DRAINAGE     pilanodal cyst   TIBIA IM  NAIL INSERTION Right 10/26/2021   Procedure: INTRAMEDULLARY (IM) NAIL TIBIAL;  Surgeon: Myrene Galas, MD;  Location: [facility] OR;  Service: Orthopedics;  Laterality: Right;   TOOTH EXTRACTION N/A 10/07/2017   Procedure: EXTRACTION TEETH NUMBERS ONE, SEVENTEEN, NINETEEN AND THIRTY TWO;  Surgeon: Ocie Doyne, DDS;  Location: [facility] OR;  Service: Oral Surgery;  Laterality: N/A;   Family History:  Family History  Adopted: Yes   Problem Relation Age of Onset   Bipolar disorder Sister    Alcohol abuse Brother    Cancer Father    Diabetes Mother    Social History:  Social History   Substance and Sexual Activity  Alcohol Use No   Comment: occ     Social History   Substance and Sexual Activity  Drug Use No    Social History   Socioeconomic History   Marital status: Single    Spouse name: Not on file   Number of children: Not on file   Years of education: Not on file   Highest education level: Not on file  Occupational History   Not on file  Tobacco Use   Smoking status: Every Day    Packs/day: 2.00    Years: 10.00    Total pack years: 20.00    Types: Cigarettes   Smokeless tobacco: Never  Vaping Use   Vaping Use: Never used  Substance and Sexual Activity   Alcohol use: No    Comment: occ   Drug use: No   Sexual activity: Yes    Birth control/protection: Injection  Other Topics Concern   Not on file  Social History Narrative   Adopted   Has Guardian   Living in [place] home with Bradly Chris   Transportation: Bus   Social Determinants of Health   Financial Resource Strain: Not on file  Food Insecurity: Not on file  Transportation Needs: Not on file  Physical Activity: Not on file  Stress: Not on file  Social Connections: Not on file    Sleep: Fair  Appetite:  Good  Current Medications: Current Facility-Administered Medications  Medication Dose Route Frequency Provider Last Rate Last Admin   benztropine (COGENTIN) tablet 1 mg  1 mg Oral BID Gloris [place], MD   1 mg at 04/24/22 1206   divalproex (DEPAKOTE) DR tablet 1,000 mg  1,000 mg Oral BID Eligha Bridegroom, NP   1,000 mg at 04/24/22 1206   doxycycline (VIBRA-TABS) tablet 100 mg  100 mg Oral Q12H Linwood Dibbles, MD   100 mg at 04/24/22 1208   LORazepam (ATIVAN) tablet 1 mg  1 mg Oral Q6H PRN Eligha Bridegroom, NP   1 mg at 04/22/22 2054   Or   LORazepam (ATIVAN) injection 2 mg  2 mg Intramuscular Q6H PRN Eligha Bridegroom, NP        LORazepam (ATIVAN) injection 2 mg  2 mg Intramuscular Once Mesner, Barbara Cower, MD       nicotine polacrilex (NICORETTE) gum 2 mg  2 mg Oral PRN Pricilla Loveless, MD   2 mg at 04/24/22 1319   OLANZapine (ZYPREXA) tablet 15 mg  15 mg Oral QHS Bing Neighbors, FNP   15 mg at 04/23/22 2056   OLANZapine zydis (ZYPREXA) disintegrating tablet 5 mg  5 mg Oral Daily Eligha Bridegroom, NP   5 mg at 04/24/22 1206   ziprasidone (GEODON) injection 20 mg  20 mg Intramuscular Daily PRN Bing Neighbors, FNP   20 mg at 04/23/22 1528   Current Outpatient Medications  Medication Sig Dispense Refill   benztropine (COGENTIN) 1 MG tablet Take 1 mg by mouth 2 (two) times daily.     divalproex (DEPAKOTE) 500 MG DR tablet Take 1 tablet (500 mg total) by mouth 2 (two) times daily for 7 days. (Patient taking differently: Take 1,000-1,500 mg by mouth See admin instructions. 1000 mg in the morning, 1500 mg in the evening) 14 tablet 0   haloperidol (HALDOL) 10 MG tablet Take 1 tablet (10 mg total) by mouth 2 (two) times daily. 60 tablet 0   lisinopril (ZESTRIL) 5 MG tablet Take 5 mg by mouth daily.     OLANZapine (ZYPREXA) 10 MG tablet Take 1 tablet (10 mg total) by mouth daily for 7 days. (Patient taking differently: Take 10 mg by mouth 2 (two) times daily.) 7 tablet 0   paliperidone (INVEGA SUSTENNA) 156 MG/ML SUSY injection Inject 156 mg into the muscle once. (Patient not taking: Reported on 04/13/2022)      Lab Results: No results found for this or any previous visit (from the past 48 hour(s)).  Blood Alcohol level:  Lab Results  Component Value Date   ETH <10 04/01/2022   ETH <10 01/18/2022   Psychiatric Specialty Exam:  Presentation  General Appearance:  Fairly Groomed  Eye Contact: Fair  Speech: Clear and Coherent  Speech Volume: Normal  Handedness: Right   Mood and Affect  Mood: Anxious  Affect: Congruent   Thought Process  Thought Processes: Goal Directed  Descriptions of  Associations:Tangential  Orientation:Partial  Thought Content:Illogical; Delusions  History of Schizophrenia/Schizoaffective disorder:Yes  Duration of Psychotic Symptoms:Greater than six months  Hallucinations:Hallucinations: None Description of Auditory Hallucinations: reports she hears butterflies Description of Visual Hallucinations: denies  Ideas of Reference:Delusions  Suicidal Thoughts:Suicidal Thoughts: No  Homicidal Thoughts:Homicidal Thoughts: No   Sensorium  Memory: Immediate Fair; Recent Fair  Judgment: Impaired  Insight: Poor   Executive Functions  Concentration: Fair  Attention Span: Fair  Recall: Fair  Fund of Knowledge: Fair  Language: Fair   Psychomotor Activity  Psychomotor Activity: Psychomotor Activity: Normal   Assets  Assets: Desire for Improvement; Physical Health; Resilience   Sleep  Sleep: Sleep: Fair Number of Hours of Sleep: 6    Physical Exam: Physical Exam Neurological:     Mental Status: She is alert and oriented to person, place, and time.  Psychiatric:        Attention and Perception: Attention normal.        Mood and Affect: Affect is labile.        Speech: Speech normal.        Behavior: Behavior is cooperative.        Thought Content: Thought content normal.    Review of Systems  Psychiatric/Behavioral:  Positive for depression. The patient is nervous/anxious and has insomnia.   All other systems reviewed and are negative.  Blood pressure (!) 107/93, pulse 82, temperature 98.5 F (36.9 C), temperature source Oral, resp. rate 18, height 5\' 8"  (1.727 m), weight 123.8 kg, SpO2 98 %, unknown if currently breastfeeding. Body mass index is 41.5 kg/m.   Medical Decision Making: Will continue to recommend [facility] waitlist. Ordered Valproic Acid level to be drawn this evening prior to PM medications.   Problem 1: Insomnia - Trazodone 50 mg Qhs PRN    , NP 04/24/2022, 2:17 PM

## 2022-04-24 NOTE — ED Provider Notes (Signed)
Emergency Medicine Observation Re-evaluation Note  Molly [name] is a 36 y.o. female, seen on rounds today.  Pt initially presented to the ED for complaints of Medical Clearance Currently, the patient is resting.  Physical Exam  BP (!) 107/93   Pulse 82   Temp 98.5 F (36.9 C) (Oral)   Resp 18   Ht 5\' 8"  (1.727 m)   Wt 123.8 kg   SpO2 98%   BMI 41.50 kg/m  Physical Exam General: NAD Cardiac: well perfused Lungs: even and unlabored Psych: no agitation  ED Course / MDM  EKG:   I have reviewed the labs performed to date as well as medications administered while in observation.  Recent changes in the last 24 hours include re-evlauted by psychiatry: Will continue to recommend [facility] waitlist. Ordered Valproic Acid level to be drawn this evening prior to PM medications.    Pt IVC paperwork re-submitted as her IVC expired today.  Plan  Current plan is for IVC for psychiatric admission.    , MD 04/24/22 [phone]

## 2022-04-24 NOTE — ED Notes (Signed)
IVC paperwork complete and in purple zone, expires 05/01/22

## 2022-04-24 NOTE — ED Notes (Signed)
Pt up to restroom, ambulated without difficulty. Pt currently using towel as head wrap and blanket as robe. Returned to room and continuing to sit quietly on side of bed.

## 2022-04-24 NOTE — ED Notes (Signed)
Pt made phone call. 

## 2022-04-24 NOTE — ED Notes (Signed)
Removed soiled blankets and excess trash from draw and cabinet. Pts bed linen changed. Facilities called to secure drawer and cabinet.

## 2022-04-24 NOTE — ED Notes (Signed)
Pt refused to let this RN check her temperature

## 2022-04-25 DIAGNOSIS — F259 Schizoaffective disorder, unspecified: Secondary | ICD-10-CM | POA: Diagnosis not present

## 2022-04-25 LAB — CBC WITH DIFFERENTIAL/PLATELET
Abs Immature Granulocytes: 0.03 10*3/uL (ref 0.00–0.07)
Basophils Absolute: 0.1 10*3/uL (ref 0.0–0.1)
Basophils Relative: 1 %
Eosinophils Absolute: 0.2 10*3/uL (ref 0.0–0.5)
Eosinophils Relative: 2 %
HCT: 35.8 % — ABNORMAL LOW (ref 36.0–46.0)
Hemoglobin: 12.1 g/dL (ref 12.0–15.0)
Immature Granulocytes: 0 %
Lymphocytes Relative: 33 %
Lymphs Abs: 3 10*3/uL (ref 0.7–4.0)
MCH: 29.1 pg (ref 26.0–34.0)
MCHC: 33.8 g/dL (ref 30.0–36.0)
MCV: 86.1 fL (ref 80.0–100.0)
Monocytes Absolute: 1 10*3/uL (ref 0.1–1.0)
Monocytes Relative: 10 %
Neutro Abs: 5.1 10*3/uL (ref 1.7–7.7)
Neutrophils Relative %: 54 %
Platelets: 365 10*3/uL (ref 150–400)
RBC: 4.16 MIL/uL (ref 3.87–5.11)
RDW: 16.4 % — ABNORMAL HIGH (ref 11.5–15.5)
WBC: 9.4 10*3/uL (ref 4.0–10.5)
nRBC: 0 % (ref 0.0–0.2)

## 2022-04-25 LAB — VALPROIC ACID LEVEL: Valproic Acid Lvl: 57 ug/mL (ref 50.0–100.0)

## 2022-04-25 MED ORDER — PALIPERIDONE ER 3 MG PO TB24
3.0000 mg | ORAL_TABLET | Freq: Every day | ORAL | Status: DC
  Administered 2022-04-25 – 2022-05-09 (×15): 3 mg via ORAL
  Filled 2022-04-25 (×15): qty 1

## 2022-04-25 MED ORDER — DIVALPROEX SODIUM 500 MG PO DR TAB
1250.0000 mg | DELAYED_RELEASE_TABLET | Freq: Two times a day (BID) | ORAL | Status: DC
  Administered 2022-04-25 – 2022-05-09 (×28): 1250 mg via ORAL
  Filled 2022-04-25 (×30): qty 1

## 2022-04-25 NOTE — ED Notes (Signed)
ED Provider at bedside per patient request

## 2022-04-25 NOTE — ED Notes (Signed)
Pt was laying in bed screaming and crying while seemingly responding to internal stimuli. Crying episode lasted about 5 minutes.   pt now laying in bed singing opera, like she was prior to crying episode.

## 2022-04-25 NOTE — ED Notes (Signed)
Patient experiencing short intermittent angry outburst, often in form of shouting profanities, consistent with response to internal stimuli.

## 2022-04-25 NOTE — Consult Note (Addendum)
  Continue to recommend [facility] waitlist/IP. Pt sleeping this morning, not willing to wake up for assessment. Continuing to wait for Valproic Acid level to determine if any further medication adjustments need to be made. Nursing aware. Psychiatry will continue to follow up.   1330 Valproic acid resulted low on therapeutic range at 57. Nursing continues to witness patient have random aggressive outbursts, and appears to be responding to internal stimuli.   At this time, will slightly increase Depakote to 1250 mg po BID. Will recommend continuing this dose for 48-72 hours and recheck valproic acid levels. Will also add Invega 3 mg po daily. It's documented patient has responded well to [facility] and specifically invega sustenna in the past.

## 2022-04-25 NOTE — ED Provider Notes (Signed)
Emergency Medicine Observation Re-evaluation Note  Molly [name] is a 36 y.o. female, seen on rounds today.  Pt initially presented to the ED for complaints of Medical Clearance Currently, the patient is resting.  Physical Exam  BP (!) 107/93   Pulse 82   Temp 98.5 F (36.9 C) (Oral)   Resp 18   Ht 5\' 8"  (1.727 m)   Wt 123.8 kg   SpO2 98%   BMI 41.50 kg/m  Physical Exam General: NAD Cardiac: well perfused Lungs: even and unlabored Psych: no agitation  ED Course / MDM  EKG:   I have reviewed the labs performed to date as well as medications administered while in observation.  Recent changes in the last 24 hours include none.  Pt IVC updated 12/23. Psychiatry continues to recommend inpatient treatment.  Plan  Current plan is for IVC for psychiatric admission.       1/24, MD 04/25/22 1112

## 2022-04-25 NOTE — ED Notes (Signed)
Pt exited room requesting to speak with RN, who was in with another pt. Pts sitter asked her to go back in her room and staff would tell RN that pt wanted to speak with her when RN was done with other Pt. Pt then walked across the hallway to stand in front of the doorway where RN was conversing with a female pt. Eduardo then stood about 2 feet in front of the door way staring into the room. Pt appeared to be visually fixating/focusing on either the RN or the female pt, unable to discern which. Writer got up to stand between pt and the doorway to the room, instructing pt to step back and to go back to her room. And reminded her that the RN would come to talk to her next.   Pt then told writer "get away from me, dont touch me. Quit telling me what to do. You're not my tech." Before walking to lean on the nurses station. Pt asked multiple times to go back to her room or we would call security. Pt requested that security be called stating "Maybe one of the officers will fuck me behind a car" Pt reminded that not to speak inappropriately/sexually and to return to room.   RN exited other pts room and was able to verbally redirect pt. Pt would then only interact with /listen to RN, completely ignoring other staffs requests. Only listening/complying if RN spoke to patient.    Pt back in her room, laying in bed singing opera.

## 2022-04-25 NOTE — ED Notes (Signed)
Patient IVC issued on 04/24/2022 for 7 days, expires on 05/01/22.

## 2022-04-26 DIAGNOSIS — F259 Schizoaffective disorder, unspecified: Secondary | ICD-10-CM | POA: Diagnosis not present

## 2022-04-26 NOTE — Progress Notes (Signed)
CSW called [facility] to inquire about pt status on waitlist. Intake staff advised CSW to call back on either Wednesday 12/27 or Thursday 12/28 due to limited staff on Christmas day today. CSW will continue to assist and follow with placement.  Cathie Beams, [place]  04/26/2022 2:42 PM

## 2022-04-26 NOTE — ED Notes (Signed)
Pt spent a prolong period of time in the shower. This RN was notified that the pt was spending a long time in the shower and was refusing to exit. This RN attempted to knock on the door and inform the pt that it was time for her to get out of the shower. This RN informed the pt that other pts needed shower as well. Pt was found on the floor of the bathroom attempting to masturbate. This RN informed the pt that she could not do that kind of behavior and she needed to leave the bathroom. Pt yelled " Get out of my shower!". Pt then proceeded to splash her shower water at this RN. At this point this RN stepped back and notified security.

## 2022-04-26 NOTE — Progress Notes (Signed)
CSW faxed up-to-date referral to [facility] with most recent consult notes, provider notes, and nursing notes. Pt remains on [facility] waitlist. CSW will continue to assist and follow with placement.  Cathie Beams, Theresia Majors  04/26/2022 12:21 PM

## 2022-04-26 NOTE — Consult Note (Signed)
Telepsych Consultation   Reason for Consult:   Psychiatric Reassessment  Referring Physician:  Dr. Gloris Manchester Location of Patient:    [name] Location of Provider: Other: virtual home office  Patient Identification: Molly [name] MRN:  [id number] Principal Diagnosis: Schizoaffective disorder [facility] Molly [name]) Diagnosis:  Principal Problem:   Schizoaffective disorder (HCC) Active Problems:   Involuntary commitment   Hallucinations   Total Time spent with patient: 30 minutes  Subjective:   Molly [name] is a 36 y.o. female patient admitted with decompensated schizoaffective disorder and medication non-compliance. Patient is currently boarded in the emergency room awaiting [facility] acceptance. Molly [name], Molly [name] continues to be followed by psychiatry and seen daily for assessment and medication management.    HPI:   Patient seen via telepsych by this provider; chart reviewed and consulted with Dr. Viviano Simas on 04/26/22.  On evaluation Molly [name] reports is seen sitting dangled, hands in front of her on bedside table.  She is fairly groomed,  has blind and black colored braids in her hair that are overgrown.  She is wearing hospital scrubs.  When greeted by this Clinical research associate, she says, "hello, has anyone ever told you that you look like a Rottweiler or a pit bull?" This Clinical research associate laughed and patient seen laughing as well.  Pt reports sleep and appetite are good; she continues to take meds as prescribed and denies side effects.  No involuntary movements, hand tremors or gait abnormalities seen today.  Pt does continue to perseverate on talking with a Child psychotherapist, "I need to see new SW to discuss my money, my check and oh I need a new ACT team."  During evaluation Molly [name] is seated at the bedside; dangled with her arms resting on bedside table in front of her; She is alert/oriented to name, aware she's in the hospital and that today is Christmas.  Pt is calm/cooperative; and mood congruent with affect.  However, pt is  impulsive and prone to verbal and physical behavioral disturbances without provocation.  Patient is speaking in a clear tone at moderate volume, and normal pace; with good eye contact.  Her thought process is irrelevant, she frequently digresses to unrelated topics; There is no indication that she is currently responding to internal/external stimuli.  Patient denies suicidal/self-harm/homicidal ideation.  Patient has remained cooperative throughout assessment and has answered questions appropriately.   Although patient is cooperative during assessment, patient's behaviors are unpredictable.  Per nursing notes, patient continues to demonstrate verbally and physically aggressive behaviors, is very impulsive and requires frequent redirection from staff.    Past Psychiatric History: Schizoaffective Disorder  Risk to Self:  yes Risk to Others:  yes Prior Inpatient Therapy:  yes,  Prior Outpatient Therapy:  unsure  Past Medical History:  Past Medical History:  Diagnosis Date   Abnormal Pap smear    ASC-cannot exclude HGSIL on Pap 02/15/2012   ASC-US on 02/03/2012 pap (associated Trichomonas infection). No reflex HPV testing performed on specimen.  Patient informed that she will need repeat Pap in one year.       Asthma    ATTENTION DEFICIT, W/O HYPERACTIVITY, History of 06/30/2006   Qualifier: History of  By: McDiarmid MD, Jae Dire, GONOCOCCAL, History of 01/09/2007   Qualifier: History of  By: McDiarmid MD, Charlann Boxer ACUMINATA, HISTORY OF 05/12/2009   Qualifier: History of  By: McDiarmid MD, Todd     Depression    Diabetes mellitus    diet controlled  Eczema    Hypertension    Overactive bladder    Schizophrenia (HCC)    SCHIZOPHRENIA, CATATONIC, HISTORY OF 12/13/2006   Annotation: Diagnoses by  Dr. Dennie Bible (Psych) At [facility] in  [place], [place]. 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   Qualifier:  Diagnosis of  By: McDiarmid MD, Benjaman Pott USER 02/08/2009   Qualifier: Diagnosis of  By: Knox Royalty      Past Surgical History:  Procedure Laterality Date   INCISION AND DRAINAGE     pilanodal cyst   TIBIA IM NAIL INSERTION Right 10/26/2021   Procedure: INTRAMEDULLARY (IM) NAIL TIBIAL;  Surgeon: Myrene Galas, MD;  Location: [facility] OR;  Service: Orthopedics;  Laterality: Right;   TOOTH EXTRACTION N/A 10/07/2017   Procedure: EXTRACTION TEETH NUMBERS ONE, SEVENTEEN, NINETEEN AND THIRTY TWO;  Surgeon: Ocie Doyne, DDS;  Location: [facility] OR;  Service: Oral Surgery;  Laterality: N/A;   Family History:  Family History  Adopted: Yes  Problem Relation Age of Onset   Bipolar disorder Sister    Alcohol abuse Brother    Cancer Father    Diabetes Mother    Family Psychiatric  History: deferred Social History:  Social History   Substance and Sexual Activity  Alcohol Use No   Comment: occ     Social History   Substance and Sexual Activity  Drug Use No    Social History   Socioeconomic History   Marital status: Single    Spouse name: Not on file   Number of children: Not on file   Years of education: Not on file   Highest education level: Not on file  Occupational History   Not on file  Tobacco Use   Smoking status: Every Day    Packs/day: 2.00    Years: 10.00    Total pack years: 20.00    Types: Cigarettes   Smokeless tobacco: Never  Vaping Use   Vaping Use: Never used  Substance and Sexual Activity   Alcohol use: No    Comment: occ   Drug use: No   Sexual activity: Yes    Birth control/protection: Injection  Other Topics Concern   Not on file  Social History Narrative   Adopted   Has Guardian   Living in [place] home with Bradly Chris   Transportation: Bus   Social Determinants of Health   Financial Resource Strain: Not on file  Food Insecurity: Not on file  Transportation Needs: Not on file  Physical Activity: Not on file  Stress: Not on file  Social  Connections: Not on file   Additional Social History:    Allergies:   Allergies  Allergen Reactions   Abilify [Aripiprazole] Other (See Comments)    Thinks it's nasty- does not want it.  Injection is ok.      Labs:  Results for orders placed or performed during the hospital encounter of 04/10/22 (from the past 48 hour(s))  Valproic acid level     Status: None   Collection Time: 04/25/22 11:33 AM  Result Value Ref Range   Valproic Acid Lvl 57 50.0 - 100.0 ug/mL    Comment: Performed at [facility], [address], [place], [place] [address]  CBC with Differential/Platelet     Status: Abnormal   Collection Time: 04/25/22 11:33 AM  Result Value Ref Range   WBC 9.4 4.0 - 10.5 K/uL  RBC 4.16 3.87 - 5.11 MIL/uL   Hemoglobin 12.1 12.0 - 15.0 g/dL   HCT 81.1 (L) 91.4 - 78.2 %   MCV 86.1 80.0 - 100.0 fL   MCH 29.1 26.0 - 34.0 pg   MCHC 33.8 30.0 - 36.0 g/dL   RDW 95.6 (H) 21.3 - 08.6 %   Platelets 365 150 - 400 K/uL   nRBC 0.0 0.0 - 0.2 %   Neutrophils Relative % 54 %   Neutro Abs 5.1 1.7 - 7.7 K/uL   Lymphocytes Relative 33 %   Lymphs Abs 3.0 0.7 - 4.0 K/uL   Monocytes Relative 10 %   Monocytes Absolute 1.0 0.1 - 1.0 K/uL   Eosinophils Relative 2 %   Eosinophils Absolute 0.2 0.0 - 0.5 K/uL   Basophils Relative 1 %   Basophils Absolute 0.1 0.0 - 0.1 K/uL   Immature Granulocytes 0 %   Abs Immature Granulocytes 0.03 0.00 - 0.07 K/uL    Comment: Performed at [facility], [address], [place] , [place] [address]    Medications:  Current Facility-Administered Medications  Medication Dose Route Frequency Provider Last Rate Last Admin   benztropine (COGENTIN) tablet 1 mg  1 mg Oral BID Gloris Manchester, MD   1 mg at 04/26/22 0954   divalproex (DEPAKOTE) DR tablet 1,250 mg  1,250 mg Oral BID Eligha Bridegroom, NP   1,250 mg at 04/26/22 0951   doxycycline (VIBRA-TABS) tablet 100 mg  100 mg Oral Penne Lash, MD   100 mg at 04/26/22 9629   LORazepam (ATIVAN) tablet 1  mg  1 mg Oral Q6H PRN Eligha Bridegroom, NP   1 mg at 04/22/22 2054   Or   LORazepam (ATIVAN) injection 2 mg  2 mg Intramuscular Q6H PRN Eligha Bridegroom, NP       LORazepam (ATIVAN) injection 2 mg  2 mg Intramuscular Once Mesner, Barbara Cower, MD       nicotine polacrilex (NICORETTE) gum 2 mg  2 mg Oral PRN Pricilla Loveless, MD   2 mg at 04/26/22 0955   OLANZapine (ZYPREXA) tablet 15 mg  15 mg Oral QHS Bing Neighbors, FNP   15 mg at 04/25/22 2044   OLANZapine zydis (ZYPREXA) disintegrating tablet 5 mg  5 mg Oral Daily Eligha Bridegroom, NP   5 mg at 04/26/22 0950   paliperidone (INVEGA) 24 hr tablet 3 mg  3 mg Oral Daily Eligha Bridegroom, NP   3 mg at 04/26/22 0954   traZODone (DESYREL) tablet 50 mg  50 mg Oral QHS PRN Eligha Bridegroom, NP   50 mg at 04/25/22 2045   ziprasidone (GEODON) injection 20 mg  20 mg Intramuscular Daily PRN Bing Neighbors, FNP   20 mg at 04/23/22 1528   Current Outpatient Medications  Medication Sig Dispense Refill   benztropine (COGENTIN) 1 MG tablet Take 1 mg by mouth 2 (two) times daily.     divalproex (DEPAKOTE) 500 MG DR tablet Take 1 tablet (500 mg total) by mouth 2 (two) times daily for 7 days. (Patient taking differently: Take 1,000-1,500 mg by mouth See admin instructions. 1000 mg in the morning, 1500 mg in the evening) 14 tablet 0   haloperidol (HALDOL) 10 MG tablet Take 1 tablet (10 mg total) by mouth 2 (two) times daily. 60 tablet 0   lisinopril (ZESTRIL) 5 MG tablet Take 5 mg by mouth daily.     OLANZapine (ZYPREXA) 10 MG tablet Take 1 tablet (10 mg total) by mouth  daily for 7 days. (Patient taking differently: Take 10 mg by mouth 2 (two) times daily.) 7 tablet 0   paliperidone (INVEGA SUSTENNA) 156 MG/ML SUSY injection Inject 156 mg into the muscle once. (Patient not taking: Reported on 04/13/2022)      Musculoskeletal: Patent moves all exremities and she ambulates independently.  Strength & Muscle Tone: within normal limits Gait & Station:  normal Patient leans: N/A   Psychiatric Specialty Exam:  Presentation  General Appearance:  Bizarre; Fairly Groomed (pt seated at beside, she has mixed blind and black colored braids in her hair that are overgrown)  Eye Contact: Fair  Speech: Clear and Coherent  Speech Volume: Normal  Handedness: Right   Mood and Affect  Mood: Euthymic; Anxious  Affect: Congruent; Full Range   Thought Process  Thought Processes: Irrevelant  Descriptions of Associations:Intact  Orientation:Partial  Thought Content:Illogical  History of Schizophrenia/Schizoaffective disorder:Yes  Duration of Psychotic Symptoms:Greater than six months  Hallucinations:Hallucinations: None   Ideas of Reference:Delusions  Suicidal Thoughts:Suicidal Thoughts: No   Homicidal Thoughts:Homicidal Thoughts: No    Sensorium  Memory: Immediate Fair; Recent Fair; Remote Fair  Judgment: Poor  Insight: Lacking   Executive Functions  Concentration: Fair  Attention Span: Fair  Recall: Fair  Fund of Knowledge: Fair  Language: Good   Psychomotor Activity  Psychomotor Activity:Psychomotor Activity: Normal    Assets  Assets: Communication Skills; Financial Resources/Insurance; Desire for Improvement   Sleep  Sleep:Number of Hours of Sleep: 7     Physical Exam: Physical Exam Vitals and nursing note reviewed.  Cardiovascular:     Rate and Rhythm: Normal rate.  Pulmonary:     Effort: Pulmonary effort is normal.  Musculoskeletal:        General: Normal range of motion.     Cervical back: Normal range of motion.  Neurological:     Mental Status: She is alert.  Psychiatric:        Attention and Perception: Attention and perception normal.        Mood and Affect: Mood is depressed. Affect is blunt.        Speech: Speech normal.        Behavior: Behavior is cooperative.        Cognition and Memory: Cognition is impaired.        Judgment: Judgment is impulsive.     Review of Systems  Constitutional: Negative.   HENT: Negative.    Eyes: Negative.   Respiratory: Negative.    Cardiovascular: Negative.   Gastrointestinal: Negative.   Genitourinary: Negative.   Musculoskeletal: Negative.   Skin: Negative.   Neurological: Negative.   Endo/Heme/Allergies: Negative.   Psychiatric/Behavioral:  Positive for depression. The patient is nervous/anxious.    Blood pressure (!) 128/90, pulse 92, temperature 98.6 F (37 C), resp. rate 16, height 5\' 8"  (1.727 m), weight 123.8 kg, SpO2 99 %, unknown if currently breastfeeding. Body mass index is 41.5 kg/m.  Treatment Plan Summary: Patient case discussed with Dr. , psychiatrist. Molly [name]. [facility] continues to meet criteria for inpatient psychiatric treatment.  Patient remains on the [facility] wait list.  While awaiting placement, psychiatry will continue to follow Molly [name]. [place] and make medication adjustments as needed.    Daily contact with patient to assess and evaluate symptoms and progress in treatment and Medication management  Continue medications as prescribed.   Disposition: Recommend psychiatric Inpatient admission when medically cleared.  This service was provided via telemedicine using a 2-way, interactive audio and video technology.  Names of  all persons participating in this telemedicine service and their role in this encounter. Name: Biagio BorgJessica Barhorst  Role: Patient   Name: Ophelia ShoulderShnese Maelee Hoot Role: PMHNP  Name: Gretta CoolSheila Maurer Role: Psychiatrist    Chales AbrahamsShnese E Jeanclaude Wentworth, NP 04/26/2022 7:10 PM

## 2022-04-26 NOTE — ED Notes (Signed)
Pt ambulated to the shower, pt room was cleaned and linen changed in mean time.

## 2022-04-26 NOTE — ED Provider Notes (Signed)
Emergency Medicine Observation Re-evaluation Note  Molly [name] is a 36 y.o. female, seen on rounds today.  Pt initially presented to the ED for complaints of Medical Clearance Currently, the patient is sitting on bed, drawing.  Physical Exam  BP (!) 134/106 (BP Location: Left Arm)   Pulse 97   Temp 98 F (36.7 C) (Oral)   Resp 19   Ht 5\' 8"  (1.727 m)   Wt 123.8 kg   SpO2 99%   BMI 41.50 kg/m  Physical Exam General: Awake, alert, nondistressed Cardiac: Extremities well-perfused Lungs: Breathing is unlabored Psych: No current agitation  ED Course / MDM  EKG:   I have reviewed the labs performed to date as well as medications administered while in observation.  Recent changes in the last 24 hours include intermittent behavioral disturbances.  Plan  Current plan is for inpatient psychiatric admission.    , MD 04/26/22 [phone]

## 2022-04-27 DIAGNOSIS — F259 Schizoaffective disorder, unspecified: Secondary | ICD-10-CM | POA: Diagnosis not present

## 2022-04-27 LAB — CBG MONITORING, ED: Glucose-Capillary: 140 mg/dL — ABNORMAL HIGH (ref 70–99)

## 2022-04-27 NOTE — Consult Note (Signed)
  Attempted to assess patient for face to face reevaluation. Pt was sitting in her bed writing a message on a piece of paper. Pt stated "are you the social worker?" I told her I was not social work, and with psychiatry team and could probably help answer any questions she might have for social work. However pt stated, "I am not talking to anyone except social work." Pt would not respond to any further questions asked or participate in assessment.   Pt receives ACTT services through [facility]. I was unable to reach anyone, likely due to Holiday week. Attempted to call [phone]. I was hoping to speak to her ACTT team to verify when the last time she received Gean Birchwood was, and to also come visit the patient to assess if she is at baseline/making progress.   I spoke to patient's legal guardian, Hunt Oris, at [phone]. She is unaware of the medications Leota should be taking, and is unsure about the last dose of LAI. She tells me we should speak to Brett Canales at [facility] about medications, and request that we ask him to come to ED to evaluate patient's baseline since he knows her well. Jeralyn Bennett is hoping for placement at [facility], however she is aware that this is a very long process, and that patient could have significant improvements prior to admission there. She tells me the biggest problem is Malak does well in the hospital and returns to baseline/compliant with medications, gets discharged, and then immediately stops taking her medications again. They have tried to place her in group homes but Navayah always elopes or refuses to go. If they get her a hotel she normally gets kicked out of the hotel. Jeralyn Bennett is out of town on vacation until January 2nd, but stated we could call her whenever.   Would recommend for following psychiatric providers to try and contact Brett Canales for evaluation of patient's progress and to determine when her last dose of invega sustenna was.   Will continue to recommend [facility]  wait list. Psychiatry will continue to follow up.

## 2022-04-27 NOTE — ED Notes (Signed)
Pt having crying outbursts asking for medication.

## 2022-04-27 NOTE — ED Provider Notes (Signed)
Emergency Medicine Observation Re-evaluation Note  Molly [name] is a 36 y.o. female, seen on rounds today.  Pt initially presented to the ED for complaints of Medical Clearance Currently, the patient is sleeping.  Physical Exam  BP 119/86 (BP Location: Left Arm)   Pulse 100   Temp (!) 97.5 F (36.4 C) (Oral)   Resp 17   Ht 5\' 8"  (1.727 m)   Wt 123.8 kg   SpO2 100%   BMI 41.50 kg/m  Physical Exam General: No acute distress Cardiac: regular rate Lungs: equal chest rise Psych: sleeping  ED Course / MDM  EKG:   I have reviewed the labs performed to date as well as medications administered while in observation.  Recent changes in the last 24 hours include none.  Plan  Current plan is for inpatient psychiatric hospitalization. On waitlist at [facility].    [facility], MD 04/27/22 [phone]

## 2022-04-28 ENCOUNTER — Ambulatory Visit: Payer: 59 | Admitting: Nurse Practitioner

## 2022-04-28 DIAGNOSIS — F259 Schizoaffective disorder, unspecified: Secondary | ICD-10-CM | POA: Diagnosis not present

## 2022-04-28 NOTE — ED Notes (Signed)
PT refused vitals and meds

## 2022-04-28 NOTE — ED Provider Notes (Signed)
Emergency Medicine Observation Re-evaluation Note  Molly [name] is a 36 y.o. female, seen on rounds today.  Pt initially presented to the ED for complaints of Medical Clearance Currently, the patient is sleeping.  Physical Exam  BP (!) 136/103   Pulse 90   Temp 98 F (36.7 C)   Resp 19   Ht 5\' 8"  (1.727 m)   Wt 123.8 kg   SpO2 100%   BMI 41.50 kg/m  Physical Exam General: No distress Cardiac: Well-perfused Lungs: No distress Psych: Calm  ED Course / MDM  EKG:   I have reviewed the labs performed to date as well as medications administered while in observation.  Recent changes in the last 24 hours include none.  Plan  Current plan is for inpatient psychiatric placement.    , MD 04/28/22 [phone]

## 2022-04-28 NOTE — BH Specialist Note (Signed)
Message left for [name] to return my call to set-up discharge plan for patient. Message left with administrative assistant for treatment to return my call.

## 2022-04-28 NOTE — ED Notes (Signed)
PT refuses vitals.

## 2022-04-28 NOTE — Progress Notes (Signed)
[facility] ED Progress Note  04/28/2022 6:17 PM Molly [name]  MRN:  [id number]   Principal Problem: Schizoaffective disorder [facility]) Diagnosis:  Principal Problem:   Schizoaffective disorder (HCC) Active Problems:   Involuntary commitment   Hallucinations   ED Assessment Time Calculation: Start Time: 1200 Stop Time: 1230 Total Time in Minutes (Assessment Completion): 30   Molly [name], 36 year old female, re-evaluated face to face per TTS consult, ongoing psychiatric evaluation while awaiting placement at [name] facility. On evaluation today, patient appears calm initially. Subsequently. Molly [name] asks, "are you my social worker?"  This Clinical research associate reoriented patient as to who I was and my role as Contractor. Molly [name] subsequently asks "what's my diagnosis, what do I have?" This writer explains that her diagnosis of schizoaffective disorder. She subsequently begins to state, " Dr.  [place] diagnosed me with schizophrenia and told me I would be like this forever."  She goes on to states, " Dr.  [place] Lions told me he could prescribe me several medications but I might overdose, so I only can take one". This Clinical research associate confirmed with the patient that she is receiving her medications. She asked why hasn't her social worker been here to the hospital to get her out. This writer reassured patient that her DSS legal guardian has been contacted and the psychiatric team is working to keep her safe and improve her overall mental health status.   Spoke with Molly [name] from "[employer]" regarding proactively working towards discharge planning for patient in the event she is not accepted to [facility]. Per Molly [name], patient is homeless despite receiving monthly disability. She has been homeless for the last several months. ACT team for a while was unable to reach her after she had to leave the hotel. Molly [name] endorses that patient struggles psychiatric at baseline, however, after being  evicted form hotel, patient has had poor follow-up with ACT team. This Clinical research associate reached out to [employer], legal guardian DSS, however as of yet to receive a return phone care back. TTS will continue to re-evaluate patient daily and will continue to work with CSW to secure appropriate placement. Patient continues to experience delusional, disorganize though process and remains unreliably able to contract for safety. Patient continues to meet criteria for inpatient psychiatric treatment.      Molly [name] Scale:  Flowsheet Row ED from 04/10/2022 in [facility] EMERGENCY DEPARTMENT ED from 04/08/2022 in [facility] from 04/06/2022 in [facility]  C-SSRS RISK CATEGORY No Risk No Risk No Risk       Past Medical History:  Past Medical History:  Diagnosis Date   Abnormal Pap smear    ASC-cannot exclude HGSIL on Pap 02/15/2012   ASC-US on 02/03/2012 pap (associated Trichomonas infection). No reflex HPV testing performed on specimen.  Patient informed that she will need repeat Pap in one year.       Asthma    ATTENTION DEFICIT, W/O HYPERACTIVITY, History of 06/30/2006   Qualifier: History of  By: Molly [name], Molly [name], GONOCOCCAL, History of 01/09/2007   Qualifier: History of  By: Molly [name], Molly [name] ACUMINATA, HISTORY OF 05/12/2009   Qualifier: History of  By: Molly [name], Molly [name]     Depression    Diabetes mellitus    diet controlled   Eczema    Hypertension    Overactive bladder    Schizophrenia (HCC)    SCHIZOPHRENIA, CATATONIC, HISTORY OF 12/13/2006  Annotation: Diagnoses by  Dr. Dennie [name] (Psych) At [facility] in  [place], [place]. 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   Qualifier: Diagnosis of  By: Molly [name], Molly [name] USER 02/08/2009   Qualifier: Diagnosis of  By: Molly [name]      Past Surgical  History:  Procedure Laterality Date   INCISION AND DRAINAGE     pilanodal cyst   TIBIA IM NAIL INSERTION Right 10/26/2021   Procedure: INTRAMEDULLARY (IM) NAIL TIBIAL;  Surgeon: Molly Galas, [name];  Location: [facility] OR;  Service: Orthopedics;  Laterality: Right;   TOOTH EXTRACTION N/A 10/07/2017   Procedure: EXTRACTION TEETH NUMBERS ONE, SEVENTEEN, NINETEEN AND THIRTY TWO;  Surgeon: Molly [name], DDS;  Location: [facility] OR;  Service: Oral Surgery;  Laterality: N/A;   Family History:  Family History  Adopted: Yes  Problem Relation Age of Onset   Bipolar disorder Sister    Alcohol abuse Brother    Cancer Father    Diabetes Mother    Social History:  Social History   Substance and Sexual Activity  Alcohol Use No   Comment: occ     Social History   Substance and Sexual Activity  Drug Use No    Social History   Socioeconomic History   Marital status: Single    Spouse name: Not on file   Number of children: Not on file   Years of education: Not on file   Highest education level: Not on file  Occupational History   Not on file  Tobacco Use   Smoking status: Every Day    Packs/day: 2.00    Years: 10.00    Total pack years: 20.00    Types: Cigarettes   Smokeless tobacco: Never  Vaping Use   Vaping Use: Never used  Substance and Sexual Activity   Alcohol use: No    Comment: occ   Drug use: No   Sexual activity: Yes    Birth control/protection: Injection  Other Topics Concern   Not on file  Social History Narrative   Adopted   Has Guardian   Living in [place] home with Molly [name]   Transportation: Bus   Social Determinants of Health   Financial Resource Strain: Not on file  Food Insecurity: Not on file  Transportation Needs: Not on file  Physical Activity: Not on file  Stress: Not on file  Social Connections: Not on file    Current Medications: Current Facility-Administered Medications  Medication Dose Route Frequency Provider Last Rate Last Admin    benztropine (COGENTIN) tablet 1 mg  1 mg Oral BID Gloris Manchester, [name]   1 mg at 04/28/22 1253   divalproex (DEPAKOTE) DR tablet 1,250 mg  1,250 mg Oral BID Eligha Bridegroom, NP   1,250 mg at 04/28/22 1252   LORazepam (ATIVAN) tablet 1 mg  1 mg Oral Q6H PRN Eligha Bridegroom, NP   1 mg at 04/27/22 1150   Or   LORazepam (ATIVAN) injection 2 mg  2 mg Intramuscular Q6H PRN Eligha Bridegroom, NP   2 mg at 04/27/22 2050   LORazepam (ATIVAN) injection 2 mg  2 mg Intramuscular Once Mesner, Barbara Cower, [name]       nicotine polacrilex (NICORETTE) gum 2 mg  2 mg Oral PRN Pricilla Loveless, [name]   2 mg at 04/28/22 1253   OLANZapine (ZYPREXA) tablet 15 mg  15 mg Oral QHS Bing Neighbors, FNP  15 mg at 04/27/22 2101   OLANZapine zydis (ZYPREXA) disintegrating tablet 5 mg  5 mg Oral Daily Eligha Bridegroomoleman, Mikaela, NP   5 mg at 04/28/22 1253   paliperidone (INVEGA) 24 hr tablet 3 mg  3 mg Oral Daily Eligha Bridegroomoleman, Mikaela, NP   3 mg at 04/28/22 1321   traZODone (DESYREL) tablet 50 mg  50 mg Oral QHS PRN Eligha Bridegroomoleman, Mikaela, NP   50 mg at 04/27/22 2053   ziprasidone (GEODON) injection 20 mg  20 mg Intramuscular Daily PRN Bing NeighborsHarris, Jleigh Striplin S, FNP   20 mg at 04/23/22 1528   Current Outpatient Medications  Medication Sig Dispense Refill   benztropine (COGENTIN) 1 MG tablet Take 1 mg by mouth 2 (two) times daily.     divalproex (DEPAKOTE) 500 MG DR tablet Take 1 tablet (500 mg total) by mouth 2 (two) times daily for 7 days. (Patient taking differently: Take 1,000-1,500 mg by mouth See admin instructions. 1000 mg in the morning, 1500 mg in the evening) 14 tablet 0   haloperidol (HALDOL) 10 MG tablet Take 1 tablet (10 mg total) by mouth 2 (two) times daily. 60 tablet 0   lisinopril (ZESTRIL) 5 MG tablet Take 5 mg by mouth daily.     OLANZapine (ZYPREXA) 10 MG tablet Take 1 tablet (10 mg total) by mouth daily for 7 days. (Patient taking differently: Take 10 mg by mouth 2 (two) times daily.) 7 tablet 0   paliperidone (INVEGA SUSTENNA) 156 MG/ML SUSY  injection Inject 156 mg into the muscle once. (Patient not taking: Reported on 04/13/2022)      Lab Results:  Results for orders placed or performed during the hospital encounter of 04/10/22 (from the past 48 hour(s))  CBG monitoring, ED     Status: Abnormal   Collection Time: 04/27/22  1:00 PM  Result Value Ref Range   Glucose-Capillary 140 (H) 70 - 99 mg/dL    Comment: Glucose reference range applies only to samples taken after fasting for at least 8 hours.    Blood Alcohol level:  Lab Results  Component Value Date   ETH <10 04/01/2022   ETH <10 01/18/2022   \  Psychiatric Specialty Exam:  Presentation  General Appearance:  Bizarre  Eye Contact: Fair  Speech: Clear and Coherent  Speech Volume: Normal  Handedness: Right   Mood and Affect  Mood: Labile  Affect: Full Range   Thought Process  Thought Processes: Disorganized  Descriptions of Associations:Tangential  Orientation:Partial  Thought Content:Illogical  History of Schizophrenia/Schizoaffective disorder:Yes  Duration of Psychotic Symptoms:Greater than six months  Hallucinations:Hallucinations: None  Ideas of Reference:Delusions  Suicidal Thoughts:Suicidal Thoughts: No  Homicidal Thoughts:Homicidal Thoughts: No   Sensorium  Memory: Immediate Fair  Judgment: Poor  Insight: Lacking   Executive Functions  Concentration: Fair  Attention Span: Fair  Recall: Fair  Fund of Knowledge: Fair  Language: Good   Psychomotor Activity  Psychomotor Activity: Psychomotor Activity: Normal   Assets  Assets: Communication Skills; Financial Resources/Insurance; Physical Health   Sleep  Sleep: Sleep: Good    Physical Exam: Physical Exam Constitutional:      Appearance: Normal appearance.  HENT:     Head: Normocephalic and atraumatic.     Nose: Nose normal.     Mouth/Throat:     Mouth: Mucous membranes are moist.  Eyes:     Extraocular Movements: Extraocular  movements intact.     Pupils: Pupils are equal, round, and reactive to light.  Cardiovascular:     Rate and  Rhythm: Normal rate.  Pulmonary:     Effort: Pulmonary effort is normal.  Musculoskeletal:        General: Normal range of motion.     Cervical back: Normal range of motion.  Neurological:     General: No focal deficit present.     Mental Status: She is alert.    Review of Systems  Psychiatric/Behavioral:  Negative for depression, substance abuse and suicidal ideas. The patient is nervous/anxious.    Blood pressure 123/89, pulse 100, temperature 98 F (36.7 C), temperature source Oral, resp. rate 18, height 5\' 8"  (1.727 m), weight 123.8 kg, SpO2 100 %, unknown if currently breastfeeding. Body mass index is 41.5 kg/m.   Medical Decision Making: Patient case review and discussed with Dr. . Patient meets criteria for inpatient psychiatric treatment.  Patient is unable to contract for safety at this time. CSW continues to work on patient placement at [facility].  [facility], FNP-PMHNP-BC 04/28/2022, 6:17 PM

## 2022-04-29 DIAGNOSIS — F259 Schizoaffective disorder, unspecified: Secondary | ICD-10-CM | POA: Diagnosis not present

## 2022-04-29 NOTE — Progress Notes (Signed)
CSW spoke with Deanna at [facility] via phone call. Pt remains on [facility] waitlist. CSW will continue to assist and follow with placement.

## 2022-04-29 NOTE — ED Notes (Signed)
PT sleeping

## 2022-04-29 NOTE — Progress Notes (Addendum)
Inpatient Behavioral Health Placement  Pt reminds on the [facility] waitlist and requested priority waitlist. Pt meets inpatient criteria per Joaquin Courts, FNP.  Referral was sent to the following facilities;   Destination  Service Provider Address Phone Fax  [facility]  [address] [place] [address] [phone]  [facility] [address] [place]  [address], [place] [address] [phone]  [facility]  [address], [place] [address] [phone]  [facility]  [address] [place] [address] [phone]  [facility]  [address] [place], [place] [address] [phone]  [facility]  [address] [place] [address] [phone]  [facility]  [address] [place] [address] [phone]  [facility]  [address] [place] [address] [phone]  [facility]  [address] [place] [address] [phone]  [facility]  [address] [place] [address] [phone]  [facility]  [address] [place] [address] [phone]  [facility]  [address] [place] [address] [phone]  [facility]  [address], [place] [address] [phone]  [facility]  [address] [place], [place] [address] [phone]  [facility]  [address] [place] [address] [phone]  [facility]  [address] [place], [place] [address] [phone]  [facility] [address]  [place] [address] [phone]  [facility]  [address] [place] [address] [phone]  [facility]  [address] [place] [address] [phone]  [facility]  [address] [place] [address] [phone]  [facility]  [address], [place] [address] [phone]  [facility]  [address] [place] [address] [phone]  [facility]  [address] [place] [address] [phone]    Situation ongoing,  CSW will follow up.   Maryjean Ka, MSW, [facility] 04/29/2022  @ 11:45 PM

## 2022-04-29 NOTE — ED Notes (Signed)
Pt walked out to staff desk requesting some meds because she was about to loose it. Pt asked for something to calm her down. Pt asked did she want a pill or shot. Pt requested a injection. Pt given PRN Ativan .

## 2022-04-29 NOTE — ED Provider Notes (Signed)
Emergency Medicine Observation Re-evaluation Note  Molly [name] is a 36 y.o. female, seen on rounds today.  Pt initially presented to the ED for complaints of Medical Clearance Currently, the patient is resting quietly.  Physical Exam  BP 123/89 (BP Location: Left Arm)   Pulse 100   Temp 98 F (36.7 C) (Oral)   Resp 18   Ht 5\' 8"  (1.727 m)   Wt 123.8 kg   SpO2 100%   BMI 41.50 kg/m  Physical Exam General: No acute distress Cardiac: Well-perfused Lungs: Nonlabored Psych: Calm  ED Course / MDM  EKG:   I have reviewed the labs performed to date as well as medications administered while in observation.  Recent changes in the last 24 hours include psychiatric medications.  Plan  Current plan is for inpatient placement.    , MD 04/29/22 [phone]

## 2022-04-29 NOTE — ED Notes (Signed)
Pt refuses to have temp checked

## 2022-04-29 NOTE — Progress Notes (Signed)
[name] Progress Note  [id number]   Molly [name]  04/29/2022  2:15 PM  Description:   Inpatient Psychiatric Referral  Patient was recommended inpatient per Joaquin Courts, NP. Patient remains on [facility] waitlist. Patient was also referred to the following facilities:   Destination  Service Provider Address Phone Fax  [facility]  [address] [place] [address] [phone]  [facility] [address] [place]  [address], [place] [address] [phone]  [facility]  [address] [place] [address] [phone]  [facility]  [address] [place], [place] [address] [phone]  [facility]  [address] [place] [address] [phone]  [facility]  [address] [place] [address] [phone]  [facility]  [address], [place] [address] [phone]  [facility]  [address] [place] [address] [phone]  [facility]  [address] [place] [address] [phone]  [facility]  [address] [place] [address] [phone]  [facility]  [address] [place] [address] [phone]  [facility]  [address], [place] [address] [phone]  [facility]  [address] [place] [address] [phone]  [facility]  [address] [place], [place] [address] [phone]  [facility] [address] [place] [address] [phone]  [facility]  [address] [place] [address] [phone]   [facility]  [address] [place] [address] [phone]    Situation ongoing, CSW to continue following and update chart as more information becomes available.      Cathie Beams, [place]  04/29/2022 2:15 PM

## 2022-04-30 DIAGNOSIS — F259 Schizoaffective disorder, unspecified: Secondary | ICD-10-CM | POA: Diagnosis not present

## 2022-04-30 LAB — CBG MONITORING, ED: Glucose-Capillary: 123 mg/dL — ABNORMAL HIGH (ref 70–99)

## 2022-04-30 MED ORDER — BACITRACIN ZINC 500 UNIT/GM EX OINT
TOPICAL_OINTMENT | Freq: Two times a day (BID) | CUTANEOUS | Status: DC
  Administered 2022-04-30 – 2022-05-09 (×5): 1 via TOPICAL
  Filled 2022-04-30 (×6): qty 0.9

## 2022-04-30 NOTE — ED Provider Notes (Signed)
Emergency Medicine Observation Re-evaluation Note  Molly [name] is a 36 y.o. female, seen on rounds today.  Pt initially presented to the ED for complaints of Medical Clearance Currently, the patient is coloring. She is complaining of some pain on her chest where she has a lesion.  Physical Exam  BP 131/86 (BP Location: Left Arm)   Pulse 99   Temp 97.7 F (36.5 C) (Oral)   Resp 20   Ht 5\' 8"  (1.727 m)   Wt 123.8 kg   SpO2 99%   BMI 41.50 kg/m  Physical Exam General: no acute distress Chest wall: examined with chaperone. ~1.5 cm in diameter oval shaped red lesion. Feels fluctuant.  Lungs: normal effort Psych: no agitation  ED Course / MDM  EKG:   I have reviewed the labs performed to date as well as medications administered while in observation.  No recent changes in the last 24 hours.  Plan  Current plan is for Psychiatric admission. On [facility] wait list.  On exam she seems to have a small fluctuant lesion on her chest wall.  Offered incision/drainage but she declines.  Will apply warm soaks and bacitracin ointment.    , MD 04/30/22 [phone]

## 2022-04-30 NOTE — ED Notes (Signed)
Pt requested to have CBG checked because she felt her sugar was low

## 2022-04-30 NOTE — Progress Notes (Signed)
CSW received a phone call from [place] at [facility]. [facility] inquired about pt referral. CSW sent updated referral to [facility] [phone]. Pt is currently under review for inpatient treatment.   Cathie Beams, [place]  04/30/2022 1:57 PM

## 2022-04-30 NOTE — ED Notes (Signed)
Ivc paperwork expires tomorrow 

## 2022-05-01 DIAGNOSIS — F259 Schizoaffective disorder, unspecified: Secondary | ICD-10-CM | POA: Diagnosis not present

## 2022-05-01 NOTE — ED Notes (Signed)
Pt refused shower and went back to sleep, will try again when pt is awake. NAD noted.

## 2022-05-01 NOTE — Consult Note (Signed)
Attempted to see patient for psychiatric reassessment via tts cart; informed by Herma Carson, RN that patient is asleep, she will alert this Clinical research associate when she is up and can be seen.

## 2022-05-01 NOTE — ED Notes (Signed)
PT eating lunch at this time calm and cooperative, NAD noted.

## 2022-05-01 NOTE — ED Notes (Signed)
This RN assumed care of patient. Pt sleeping bed at this time.

## 2022-05-01 NOTE — ED Notes (Signed)
Pt agrees to shower, change all clothes and then allow this RN to place bacitracin ointment on chest wound.

## 2022-05-01 NOTE — ED Provider Notes (Signed)
Emergency Medicine Observation Re-evaluation Note  Molly [name] is a 36 y.o. female, seen on rounds today.  Pt initially presented to the ED for complaints of Medical Clearance Currently, the patient is awaiting placement, history of schizo to have disorder and presenting disorganized and delusional.  Continues to need inpatient per psychiatry recommendations. She continues to appear disorganized and delusional.   Physical Exam  BP 126/89 (BP Location: Left Arm)   Pulse 85   Temp 97.7 F (36.5 C) (Oral)   Resp 18   Ht 5\' 8"  (1.727 m)   Wt 123.8 kg   LMP  (Within Months)   SpO2 97%   BMI 41.50 kg/m  Physical Exam General: NAD Cardiac: RR Lungs: even,unlabored Psych: n/a  ED Course / MDM  EKG:   I have reviewed the labs performed to date as well as medications administered while in observation.  Recent changes in the last 24 hours include none.  Plan  Current plan is for inpatient placement.  IVC renewed.    , MD 05/01/22 1038

## 2022-05-02 DIAGNOSIS — F259 Schizoaffective disorder, unspecified: Secondary | ICD-10-CM | POA: Diagnosis not present

## 2022-05-02 NOTE — ED Notes (Signed)
Pt finished putting her pants on. 2 warm blankets provided for pt.

## 2022-05-02 NOTE — ED Notes (Signed)
Pt refused VS at this time. Will attempt later.

## 2022-05-02 NOTE — ED Notes (Signed)
Patient refused vital signs taken.

## 2022-05-02 NOTE — ED Notes (Signed)
Pt refusing to get dressed in scrubs. Pt requesting warm blankets. Pt was informed that if she put the scrubs on then she would get the blankets. Once this RN left pt room, she then attempted to ask the sitter for blankets. Informed pt that staff all heard what I said about putting the scrubs on before getting blankets. Pt then agreeable to put her shirt on and another pair of scrub bottoms was provided to pt. Pt currently sitting on bed w/ scrub top on, and 1 leg in the pants watching TV. Blankets to be provided once pt finishes putting on clothes.

## 2022-05-02 NOTE — ED Notes (Signed)
Steinl MD at bedside at this time 

## 2022-05-02 NOTE — ED Notes (Signed)
Provided pt w/ warm blankets

## 2022-05-02 NOTE — Progress Notes (Addendum)
Inpatient Behavioral Health Placement  Pt meets inpatient criteria per Joaquin Courts, FNP.  No available beds at [facility]. This CSW was unable to make contact with [facility] for follow up on referral. First shift to seek follow up with [facility]. Referral was sent to the following facilities;    Destination  Service Provider Address Phone Fax  [facility]  [address], [place] [address] [phone]  [facility] [address] [place]  [address], [place] [address] [phone]  [facility]  [address], [place] [address] [phone]  [facility]  [address] [place] [address] [phone]  [facility]  [address] [place], [place] [address] [phone]  [facility]  [address] [place] [address] [phone]  [facility]  [address] [place] [address] [phone]  [facility]  [address] [place] [address] [phone]  [facility]  [address] [place] [address] [phone]  [facility]  [address] [place] [address] [phone]  [facility]  [address] [place] [address] [phone]  [facility]  [address] [place] [address] [phone]  [facility]  [address] [place] [address] [phone]  [facility]  [address] [place], [place] [address] [phone]  [facility]  [address] [place] [address] [phone]  [facility]  [address] [place],  [place] [address] [phone]  [facility] [address] [place] [address] [phone]  [facility]  [address] [place] [address] [phone]  [facility]  [address] [place] [address] [phone]  [facility]  [address] [place] [address] [phone]  [facility]  [address] [place] [address] [phone]  [facility]  [address], [place] [address] [phone]  [facility]  [address] [place] [address] [phone]  [facility]  [address] [place] [address] [phone]  [facility]  [address], [place] [address] [phone]  [facility]  [address], [place] [address] [phone]   Situation ongoing,  CSW will follow up.   Maryjean Ka, MSW, LCSWA 05/02/2022  @ 5:36 PM

## 2022-05-02 NOTE — ED Notes (Addendum)
Pt informed that she needs to put the scrubs on that she was given after her shower. Will continue to monitor. Pt currently wearing makeshift bra made from mesh panties and a pair of mesh panties.

## 2022-05-02 NOTE — ED Provider Notes (Signed)
Emergency Medicine Observation Re-evaluation Note  Molly [name] is a 36 y.o. female, seen on rounds today.  Pt initially presented to the ED for complaints of psych eval. Pt currently calm, nad, no new c/o this AM.   Physical Exam  BP (!) 120/108 (BP Location: Right Arm)   Pulse 100   Temp 98.2 F (36.8 C)   Resp 16   Ht 1.727 m (5\' 8" )   Wt 123.8 kg   SpO2 100%   BMI 41.50 kg/m  Physical Exam General: calm. Cardiac: reg rate. Lungs: breathing comfortably. Psych: normal mood/affect.   ED Course / MDM    I have reviewed the labs performed to date as well as medications administered while in observation.  Recent changes in the last 24 hours include ED obs, med management, BH reassessment.   Plan  Current plan is for continued med management by [facility] team.  Dispo and placement per [facility] team.     [facility], MD 05/02/22 1121

## 2022-05-02 NOTE — ED Notes (Signed)
Patient agitated and threatening staff, patient redirected and informed to be calmed but she is not responding to deescalating.

## 2022-05-02 NOTE — Progress Notes (Signed)
[facility] ED Progress Note  05/02/2022 1:28 PM Molly [name]  MRN:  [id number]  Principal Problem: Schizoaffective disorder Molly [name]:  Principal Problem:   Schizoaffective disorder (HCC) Active Problems:   Involuntary commitment   Hallucinations   ED Assessment Time Calculation: Start Time: 1015 Stop Time: 1030 Total Time in Minutes (Assessment Completion): 15  Molly [name], 36 year old female, re-evaluated face to face per TTS consult, ongoing psychiatric evaluation while awaiting placement at [name] inpatient [name]. On evaluation today, patient appears calm, and asked this Clinical research associate, "Are you my new social worker?". This Clinical research associate reoriented patient again as to this writer's role with psychiatry. Patient continues to exhibit disorganized thought and tangential speech patterns. Molly [name] continues to deny suicidal and homicidal ideations. She maintains that her appetite is good and denies any difficulty with sleep. Patient is currently under review by [name] psychiatric inpatient [name] while remaining on the [name] priority wait-list.Patient is unable to reliably contract for safety and continues to meet inpatient criteria for psychiatric treatment.    Molly [name]:  Flowsheet Row ED from 04/10/2022 in [name] ED from 04/08/2022 in [facility] from 04/06/2022 in [facility]  C-SSRS RISK CATEGORY No Risk No Risk No Risk       Past Medical History:  Past Medical History:  [name] Date   Abnormal Pap smear    ASC-cannot exclude HGSIL on Pap 02/15/2012   ASC-US on 02/03/2012 pap (associated Trichomonas infection). No reflex HPV testing performed on specimen.  Patient informed that she will need repeat Pap in one year.       Asthma    ATTENTION DEFICIT, W/O HYPERACTIVITY, History of 06/30/2006   Qualifier: History of  By: Molly [name], Molly [name],  GONOCOCCAL, History of 01/09/2007   Qualifier: History of  By: Molly [name], Molly [name] ACUMINATA, HISTORY OF 05/12/2009   Qualifier: History of  By: Molly [name], Molly [name]     Depression    Diabetes mellitus    diet controlled   Eczema    Hypertension    Overactive bladder    Schizophrenia (HCC)    SCHIZOPHRENIA, CATATONIC, HISTORY OF 12/13/2006   Annotation: Diagnoses by  Dr. Dennie [name] (Psych) At [name] in  [place], [place]. 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   Qualifier: [name] of  By: Molly [name], Molly [name] USER 02/08/2009   Qualifier: [name] of  By: Molly [name]      Past Surgical History:  Procedure Laterality Date   INCISION AND DRAINAGE     pilanodal cyst   TIBIA IM NAIL INSERTION Right 10/26/2021   Procedure: INTRAMEDULLARY (IM) NAIL TIBIAL;  Surgeon: Molly Galas, [name];  Location: [facility] OR;  Service: Orthopedics;  Laterality: Right;   TOOTH EXTRACTION N/A 10/07/2017   Procedure: EXTRACTION TEETH NUMBERS ONE, SEVENTEEN, NINETEEN AND THIRTY TWO;  Surgeon: Molly [name], DDS;  Location: [facility] OR;  Service: Oral Surgery;  Laterality: N/A;   Family History:  Family History  Adopted: Yes  Problem Relation Age of Onset   Bipolar disorder Sister    Alcohol abuse Brother    Cancer Father    Diabetes Mother    Social History:  Social History   Substance and Sexual Activity  Alcohol Use No   Comment: occ     Social History  Substance and Sexual Activity  Drug Use No    Social History   Socioeconomic History   Marital status: Single    Spouse name: Not on file   Number of children: Not on file   Years of education: Not on file   Highest education level: Not on file  Occupational History   Not on file  Tobacco Use   Smoking status: Every Day    Packs/day: 2.00    Years: 10.00    Total pack years: 20.00    Types: Cigarettes   Smokeless tobacco: Never  Vaping Use   Vaping  Use: Never used  Substance and Sexual Activity   Alcohol use: No    Comment: occ   Drug use: No   Sexual activity: Yes    Birth control/protection: Injection  Other Topics Concern   Not on file  Social History Narrative   Adopted   Has Guardian   Living in [place] home with Molly [name]   Transportation: Bus   Social Determinants of Health   Financial Resource Strain: Not on file  Food Insecurity: Not on file  Transportation Needs: Not on file  Physical Activity: Not on file  Stress: Not on file  Social Connections: Not on file    Sleep: Good  Appetite:  Poor  Current Medications: Current [name]-Administered Medications  Medication Dose Route Frequency Provider Last Rate Last Admin   bacitracin ointment   Topical BID Molly Loveless, [name]   1 Application at 05/01/22 2143   benztropine (COGENTIN) tablet 1 mg  1 mg Oral BID Molly Manchester, [name]   1 mg at 05/02/22 1019   divalproex (DEPAKOTE) DR tablet 1,250 mg  1,250 mg Oral BID Molly Bridegroom, NP   1,250 mg at 05/02/22 1016   LORazepam (ATIVAN) tablet 1 mg  1 mg Oral Q6H PRN Molly Bridegroom, NP   1 mg at 04/30/22 2103   Or   LORazepam (ATIVAN) injection 2 mg  2 mg Intramuscular Q6H PRN Molly Bridegroom, NP   2 mg at 04/29/22 1614   nicotine polacrilex (NICORETTE) gum 2 mg  2 mg Oral PRN Molly Loveless, [name]   2 mg at 05/02/22 1234   OLANZapine (ZYPREXA) tablet 15 mg  15 mg Oral QHS Molly Neighbors, Molly [name]   15 mg at 05/01/22 2131   OLANZapine zydis (ZYPREXA) disintegrating tablet 5 mg  5 mg Oral Daily Molly Bridegroom, NP   5 mg at 05/02/22 1019   paliperidone (INVEGA) 24 hr tablet 3 mg  3 mg Oral Daily Molly Bridegroom, NP   3 mg at 05/02/22 1016   traZODone (DESYREL) tablet 50 mg  50 mg Oral QHS PRN Molly Bridegroom, NP   50 mg at 05/01/22 2131   ziprasidone (GEODON) injection 20 mg  20 mg Intramuscular Daily PRN Molly Neighbors, Molly [name]   20 mg at 04/23/22 1528   Current Outpatient Medications  Medication Sig Dispense  Refill   benztropine (COGENTIN) 1 MG tablet Take 1 mg by mouth 2 (two) times daily.     divalproex (DEPAKOTE) 500 MG DR tablet Take 1 tablet (500 mg total) by mouth 2 (two) times daily for 7 days. (Patient taking differently: Take 1,000-1,500 mg by mouth See admin instructions. 1000 mg in the morning, 1500 mg in the evening) 14 tablet 0   haloperidol (HALDOL) 10 MG tablet Take 1 tablet (10 mg total) by mouth 2 (two) times daily. 60 tablet 0   lisinopril (ZESTRIL) 5 MG tablet Take 5  mg by mouth daily.     OLANZapine (ZYPREXA) 10 MG tablet Take 1 tablet (10 mg total) by mouth daily for 7 days. (Patient taking differently: Take 10 mg by mouth 2 (two) times daily.) 7 tablet 0   paliperidone (INVEGA SUSTENNA) 156 MG/ML SUSY injection Inject 156 mg into the muscle once. (Patient not taking: Reported on 04/13/2022)      Lab Results: No results found for this or any previous visit (from the past 48 hour(s)).  Blood Alcohol level:  Lab Results  Component Value Date   [name] <10 04/01/2022   ETH <10 01/18/2022    Physical Findings:   Psychiatric Specialty Exam:  Presentation  General Appearance:  Appropriate for Environment  Eye Contact: Fair  Speech: Clear and Coherent  Speech Volume: Normal  Handedness: Right   Mood and Affect  Mood: Labile  Affect: Full Range   Thought Process  Thought Processes: Disorganized  Descriptions of Associations:Tangential  Orientation:Partial  Thought Content:Illogical  History of Schizophrenia/Schizoaffective disorder:Yes  Duration of Psychotic Symptoms:Greater than six months  Hallucinations:No data recorded Ideas of Reference:Delusions  Suicidal Thoughts:No data recorded Homicidal Thoughts:No data recorded  Sensorium  Memory: Immediate Fair  Judgment: Poor  Insight: Lacking   Executive Functions  Concentration: Fair  Attention Span: Fair  Recall: [employer] of  Knowledge: Fair  Language: Good   Psychomotor Activity  Psychomotor Activity:No data recorded  Assets  Assets: Communication Skills; Financial Resources/Insurance; Physical Health   Sleep  Sleep: Good    Physical Exam: Physical Exam Vitals reviewed.  Constitutional:      Appearance: Normal appearance.  HENT:     Head: Normocephalic.     Right Ear: External ear normal.     Left Ear: External ear normal.  Eyes:     Extraocular Movements: Extraocular movements intact.     Conjunctiva/sclera: Conjunctivae normal.     Pupils: Pupils are equal, round, and reactive to light.  Cardiovascular:     Rate and Rhythm: Tachycardia present.  Pulmonary:     Effort: Pulmonary effort is normal.     Breath sounds: Normal breath sounds.  Neurological:     General: No focal deficit present.     Mental Status: She is alert.    Review of Systems  Psychiatric/Behavioral: Negative.     Blood pressure (!) 120/108, pulse 100, temperature 98.2 F (36.8 C), resp. rate 16, height 5\' 8"  (1.727 m), weight 123.8 kg, SpO2 100 %, unknown if currently breastfeeding. Body mass index is 41.5 kg/m.   Medical Decision Making: Patient case review and discussed with Dr. . Patient continues to meets criteria for inpatient psychiatric treatment. [name]. Continue to remain of the [facility] priority wait-list.Patient is unable to reliably contract for safety at this time. TTS will continue evaluate daily  Problem 1: Schizoaffective disorder, decompensated, continue Olanzapine 15 mg at QHS and 5 mg daily, Depakote 1250 mg BID, recent Valproic Acid level 57 (12/24), and Invega 3 mg daily.  -Will repeat Valproic Acid level to ensure therapeutic range given recent adjusts.      04-13-1998, Molly [name]-C, PMHNP-BC 05/02/2022, 1:28 PM

## 2022-05-02 NOTE — ED Notes (Signed)
Pt took a shower. While pt taking a shower, staff removed hoarded items from room, and mopped floor d/t pt had urinated in room. Also removed all linen from room and wiped the bed down d/t bed being drenched in urine. Pt angry upon entry to room. Pt did not remove makeshift underwear and bra that she created from mesh panties for shower. Metal piece from a mask was found in pt's room and it was removed also. Belongings in room searched for potential objects that could be used as weapons. Pt called this RN a Research officer, political party.

## 2022-05-02 NOTE — ED Notes (Signed)
Advised pt to put pants on , she only has on mesh panties on

## 2022-05-02 NOTE — ED Notes (Signed)
Pt still refusing VS

## 2022-05-03 DIAGNOSIS — F259 Schizoaffective disorder, unspecified: Secondary | ICD-10-CM | POA: Diagnosis not present

## 2022-05-03 DIAGNOSIS — F250 Schizoaffective disorder, bipolar type: Secondary | ICD-10-CM | POA: Diagnosis not present

## 2022-05-03 LAB — VALPROIC ACID LEVEL: Valproic Acid Lvl: 81 ug/mL (ref 50.0–100.0)

## 2022-05-03 NOTE — Consult Note (Signed)
  Around 0130 this morning on 05/03/22 it was documented by Kandee Keen, RN Patient wrapped blanket around her neck, patient informed not to wrap blanket around her neck and that blanket will be taken away if she wrap it around he neck again. Patient acknowledged and states she will not wrapped blanket around her neck.    Attempted to reevalutae patient this morning, however she kept her eyes closed, would not sit up, and minimally engaged in conversation. When asked about suicidal ideations she stated "I don't know." She denies current HI/AVH.   Pt valproic acid level resulted therapeutic at 81 mg. Will not make any further adjustments with Depakote at this time.   Latest [facility] update by Denna Haggard, [name] "CSW received phone call from [place] at [facility]. Pt continues to be on waitlist for [place]. CSW will continue to assist and follow with placement."   Due to suicidal behaviors and patient unable to engage in safety contracting will continue to recommend inpatient psychiatric treatment/[facility] waitlist.

## 2022-05-03 NOTE — Progress Notes (Signed)
Inpatient Behavioral Health Placement  Pt meets inpatient criteria per Vesta Mixer, NP.  Pt remains ot [facility] waitlist. There are no available beds at [facility]. Referral was sent to the following facilities;   Destination  Service Provider Address Phone Fax  [facility]  [address], [place] [address] [phone]  [place] [place] [address] [phone]  [facility]  [address] [place] [address] [phone]  [facility]  [place], [place] [address] [phone]  [facility]  [address] [place], [place] [address] [phone] [place] [address] [place]  [facility]  [address] [place] [address] [phone]  [facility]  [address] [place] [address] [phone]  [facility]  [address] [place] [address] [phone] [place], [place] [address] [phone] [facility]  [address], [place] [address] [phone]  [facility]  [address] [place] [address] [phone] [facility]  [address] [place] [address] [phone]  [facility]  [place], [place] [address] [phone]  [facility]  [address] [place] [address] [phone] [facility]  [place], [place] [address] [address]  [facility]  [address] [place] [address] [phone]  [facility]  [address] [place] [address] [phone]  [facility]  [address] [place] [address] [phone]  [facility]  [address], [place] [address] [place]  [facility]  [address] [place] [address] [phone]  [facility]  [address], [place] [address] [phone]  [place] [address] [phone] [facility]  [address] [place] [address] [phone]  [facility]  [address], [place] [address] [phone] [facility]  [address], [place] [address] [phone]    Situation ongoing,  CSW will follow up.   Benjaman Kindler, MSW, LCSWA 05/03/2022  @ 10:00 PM

## 2022-05-03 NOTE — Progress Notes (Signed)
CSW attempted to speak with Shaleta in admissions at [facility] via phone call. CSW was unable to speak with Shaleta due to being out of office today. CSW left voicemail inquiring about update on pt's referral. CSW will continue to assist and follow with placement.  Denna Haggard, LCSWA  05/03/2022 1:40 PM

## 2022-05-03 NOTE — ED Notes (Signed)
Patient refusing blood draw for valproic acid level at this time

## 2022-05-03 NOTE — ED Notes (Signed)
Pt is sleeping at this time.

## 2022-05-03 NOTE — ED Notes (Signed)
IVC was finished 05/01/22; EXP:05/08/22 in purple zone

## 2022-05-03 NOTE — ED Notes (Signed)
Patient wrapped blanket around her neck, patient informed not to wrap blanket around her neck and that blanket will be taken away if she wrap it around he neck again. Patient acknowledged and states she will not wrapped blanket around her neck.

## 2022-05-03 NOTE — Progress Notes (Signed)
CSW received phone call from [place] at [facility]. Pt continues to be on waitlist for [place]. CSW will continue to assist and follow with placement.  Molly [name], [place]  05/03/2022 11:35 AM

## 2022-05-03 NOTE — ED Notes (Signed)
IVC'd 05/01/22, exp. 05/08/22, purple zone  

## 2022-05-03 NOTE — ED Notes (Signed)
Patient calm and cooperative at this time.  °

## 2022-05-03 NOTE — ED Provider Notes (Signed)
Emergency Medicine Observation Re-evaluation Note  Molly [name] is a 37 y.o. female, seen on rounds today.  Pt initially presented to the ED for complaints of Medical Clearance Currently, the patient is sleeping.  Physical Exam  BP 128/80 (BP Location: Other (Comment)) Comment (BP Location): forearm  Pulse 88   Temp 99.5 F (37.5 C) (Oral)   Resp 18   Ht 5\' 8"  (1.727 m)   Wt 123.8 kg   SpO2 97%   BMI 41.50 kg/m  Physical Exam General: sleeping Cardiac: regular Lungs: no distress Psych: prior agitation  ED Course / MDM  EKG:   I have reviewed the labs performed to date as well as medications administered while in observation.  Recent changes in the last 24 hours include pt agitated last night requiring geodon.  Plan  Current plan is for inpt placement.    Blanchie Dessert, MD 05/03/22 [phone]

## 2022-05-03 NOTE — ED Notes (Signed)
Patient agreeable now to vital sign taken

## 2022-05-03 NOTE — ED Notes (Signed)
Pt laid back down

## 2022-05-03 NOTE — ED Notes (Signed)
Pt is awake and eating calmly at this time

## 2022-05-03 NOTE — ED Notes (Signed)
Pt got up and went to the bathroom and threw up.  Pt was assisted in cleaning up and given some ginger ale and crackers. Pt peed on her pants so she is laying in bed in only mesh panties currently.   PT complains of a bump on her private area.  I told her I would let the doctor know.

## 2022-05-04 DIAGNOSIS — F259 Schizoaffective disorder, unspecified: Secondary | ICD-10-CM | POA: Diagnosis not present

## 2022-05-04 DIAGNOSIS — F250 Schizoaffective disorder, bipolar type: Secondary | ICD-10-CM | POA: Diagnosis not present

## 2022-05-04 NOTE — Progress Notes (Signed)
[facility] Psych ED Progress Note  05/04/2022 10:47 AM Molly [name]  MRN:  [id number]   Subjective:   Patient seen this morning at [facility] for face to face reevaluation. She is laying in bed, has her covers pulled up mostly over face. She is very minimal with responses, poorly engaged in assessment. She says "no" when asked about any auditory or visual hallucinations. When asked about SI/HI she would not respond stating "just let me go back to sleep. If you aren't the social worker let me sleep"   Will continue to recommend [place] placement for patient. She has long history of medication noncompliance and chronic representations to hospital even with support from [facility] and ACT team. She is currently being reviewed at [facility] for inpatient treatment while she continues to wait for [facility] admission.   Principal Problem: Schizoaffective disorder ([place]) Diagnosis:  Principal Problem:   Schizoaffective disorder ([place]) Active Problems:   Involuntary commitment   ED Assessment Time Calculation: Start Time: 1030 Stop Time: 1100 Total Time in Minutes (Assessment Completion): [place] Scale:  [place] ED from 04/10/2022 in [place] ED from 04/08/2022 in [place] DEPT ED from 04/06/2022 in [place] DEPT  C-SSRS RISK CATEGORY No Risk No Risk No Risk       Past Medical History:  Past Medical History:  Diagnosis Date   Abnormal Pap smear    ASC-cannot exclude HGSIL on Pap 02/15/2012   ASC-US on 02/03/2012 pap (associated Trichomonas infection). No reflex HPV testing performed on specimen.  Patient informed that she will need repeat Pap in one year.       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 Henrene Dodge (Psych) At [facility] in  [place], [place]. 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   Qualifier: Diagnosis of  By: McDiarmid MD, Jolyn Nap USER 02/08/2009   Qualifier: Diagnosis of  By: Samara Snide      Past Surgical History:  Procedure Laterality Date   INCISION AND DRAINAGE     pilanodal cyst   TIBIA IM NAIL INSERTION Right 10/26/2021   Procedure: INTRAMEDULLARY (IM) NAIL TIBIAL;  Surgeon: Altamese Shelby, MD;  Location: [place];  Service: Orthopedics;  Laterality: Right;   TOOTH EXTRACTION N/A 10/07/2017   Procedure: EXTRACTION TEETH NUMBERS ONE, SEVENTEEN, NINETEEN AND THIRTY TWO;  Surgeon: Diona Browner, DDS;  Location: [place];  Service: Oral Surgery;  Laterality: N/A;   Family History:  Family History  Adopted: Yes  Problem Relation Age of Onset   Bipolar disorder Sister    Alcohol abuse Brother    Cancer Father    Diabetes Mother    Social History:  Social History   Substance and Sexual Activity  Alcohol Use No   Comment: occ  Social History   Substance and Sexual Activity  Drug Use No    Social History   Socioeconomic History   Marital status: Single    Spouse name: Not on file   Number of children: Not on file   Years of education: Not on file   Highest education level: Not on file  Occupational History   Not on file  Tobacco Use   Smoking status: Every Day    Packs/day: 2.00    Years: 10.00    Total pack years: 20.00    Types: Cigarettes   Smokeless tobacco: Never  Vaping Use   Vaping Use: Never used  Substance and Sexual Activity   Alcohol use: No    Comment: occ   Drug use: No   Sexual  activity: Yes    Birth control/protection: Injection  Other Topics Concern   Not on file  Social History Narrative   Adopted   Has Guardian   Living [place] home with Bradly Chris   Transportation: Bus   Social Determinants of Health   Financial Resource Strain: Not on file  Food Insecurity: Not on file  Transportation Needs: Not on file  Physical Activity: Not on file  Stress: Not on file  Social Connections: Not on file    Sleep: Good  Appetite:  Good  Current Medications: Current Facility-Administered Medications  Medication Dose Route Frequency Provider Last Rate Last Admin   bacitracin ointment   Topical BID Pricilla Loveless, MD   1 Application at 05/03/22 2120   benztropine (COGENTIN) tablet 1 mg  1 mg Oral BID Gloris Manchester, MD   1 mg at 05/04/22 1039   divalproex (DEPAKOTE) DR tablet 1,250 mg  1,250 mg Oral BID Eligha Bridegroom, NP   1,250 mg at 05/04/22 1039   LORazepam (ATIVAN) tablet 1 mg  1 mg Oral Q6H PRN Eligha Bridegroom, NP   1 mg at 05/03/22 2119   Or   LORazepam (ATIVAN) injection 2 mg  2 mg Intramuscular Q6H PRN Eligha Bridegroom, NP   2 mg at 04/29/22 1614   nicotine polacrilex (NICORETTE) gum 2 mg  2 mg Oral PRN Pricilla Loveless, MD   2 mg at 05/03/22 2120   OLANZapine (ZYPREXA) tablet 15 mg  15 mg Oral QHS Bing Neighbors, FNP   15 mg at 05/03/22 2119   OLANZapine zydis (ZYPREXA) disintegrating tablet 5 mg  5 mg Oral Daily Eligha Bridegroom, NP   5 mg at 05/04/22 1038   paliperidone (INVEGA) 24 hr tablet 3 mg  3 mg Oral Daily Eligha Bridegroom, NP   3 mg at 05/04/22 1037   traZODone (DESYREL) tablet 50 mg  50 mg Oral QHS PRN Eligha Bridegroom, NP   50 mg at 05/03/22 2119   ziprasidone (GEODON) injection 20 mg  20 mg Intramuscular Daily PRN Bing Neighbors, FNP   20 mg at 05/02/22 2019   Current Outpatient Medications  Medication Sig Dispense Refill   benztropine (COGENTIN) 1 MG tablet Take 1 mg by mouth 2 (two) times daily.     divalproex (DEPAKOTE)  500 MG DR tablet Take 1 tablet (500 mg total) by mouth 2 (two) times daily for 7 days. (Patient taking differently: Take 1,000-1,500 mg by mouth See admin instructions. 1000 mg in the morning, 1500 mg in the evening) 14 tablet 0   haloperidol (HALDOL) 10 MG tablet Take 1 tablet (10 mg total) by mouth 2 (two) times daily. 60 tablet 0   lisinopril (ZESTRIL) 5  MG tablet Take 5 mg by mouth daily.     OLANZapine (ZYPREXA) 10 MG tablet Take 1 tablet (10 mg total) by mouth daily for 7 days. (Patient taking differently: Take 10 mg by mouth 2 (two) times daily.) 7 tablet 0   paliperidone (INVEGA SUSTENNA) 156 MG/ML SUSY injection Inject 156 mg into the muscle once. (Patient not taking: Reported on 04/13/2022)      Lab Results:  Results for orders placed or performed during the hospital encounter of 04/10/22 (from the past 48 hour(s))  Valproic acid level     Status: None   Collection Time: 05/03/22 11:03 AM  Result Value Ref Range   Valproic Acid Lvl 81 50.0 - 100.0 ug/mL    Comment: Performed at [facility] [address] [place], [place] [address]    Blood Alcohol level:  Lab Results  Component Value Date   [facility] <10 04/01/2022   ETH <10 01/18/2022    Psychiatric Specialty Exam:  Presentation  General Appearance:  Appropriate for Environment  Eye Contact: Minimal  Speech: Clear and Coherent  Speech Volume: Normal  Handedness: Right   Mood and Affect  Mood: Irritable  Affect: Congruent   Thought Process  Thought Processes: Linear  Descriptions of Associations:Tangential  Orientation:Other (comment) (would not answer orientation questions)  Thought Content:Illogical  History of Schizophrenia/Schizoaffective disorder:Yes  Duration of Psychotic Symptoms:Greater than six months  Hallucinations:Hallucinations: None  Ideas of Reference:Delusions  Suicidal Thoughts:Suicidal Thoughts: -- (would not answer)  Homicidal Thoughts:Homicidal Thoughts: -- (would  not answer)   Sensorium  Memory: Immediate Fair; Recent Fair  Judgment: Impaired  Insight: Poor   Executive Functions  Concentration: Fair  Attention Span: Fair  Recall: [employer] of Knowledge: Fair  Language: Fair   Psychomotor Activity  Psychomotor Activity: Psychomotor Activity: Normal   Assets  Assets: Physical Health; Resilience   Sleep  Sleep: Sleep: Good    Physical Exam: Physical Exam Neurological:     Mental Status: She is alert.  Psychiatric:        Attention and Perception: She is inattentive.        Mood and Affect: Affect is flat.        Speech: Speech normal.        Behavior: Behavior is withdrawn.    Review of Systems  Psychiatric/Behavioral:         Remains psychotic  All other systems reviewed and are negative.  Blood pressure 110/70, pulse (!) 113, temperature 98.7 F (37.1 C), temperature source Oral, resp. rate 18, height 5\' 8"  (1.727 m), weight 123.8 kg, SpO2 97 %, unknown if currently breastfeeding. Body mass index is 41.5 kg/m.   Medical Decision Making: Pt case reviewed and discussed with Dr. Dwyane Dee. Will continue to recommend IP/CR[facility] waitlist for patient. Pt currently under [facility] for [facility], as well as remaining on [facility] priority waitlist.   - no medication changes at this time  Vesta Mixer, NP 05/04/2022, 10:47 AM

## 2022-05-04 NOTE — ED Notes (Signed)
Pt resting comfortably, no distress noted. No complaints

## 2022-05-04 NOTE — Progress Notes (Signed)
CSW attempted to speak with Shaleta in admissions at [facility] via phone call. CSW was unable to speak with Shaleta due to no answer. CSW left voicemail inquiring about update on pt's referral. CSW also sent secure email following up on pt's referral. CSW will continue to assist and follow with placement.   Denna Haggard, [place]  05/04/2022 10:25 AM

## 2022-05-04 NOTE — ED Notes (Signed)
Pt awake and asking for lunch tray.

## 2022-05-04 NOTE — ED Provider Notes (Signed)
Emergency Medicine Observation Re-evaluation Note  Molly [name] is a 37 y.o. female, seen on rounds today.  Pt initially presented to the ED for complaints of Medical Clearance Currently, the patient is sleeping.  Physical Exam  BP 110/70 (BP Location: Left Wrist)   Pulse (!) 113   Temp 98.7 F (37.1 C) (Oral)   Resp 18   Ht 5\' 8"  (1.727 m)   Wt 123.8 kg   SpO2 97%   BMI 41.50 kg/m  Physical Exam General: No distress Cardiac: Well-perfused Lungs: No distress Psych: Sleeping  ED Course / MDM  EKG:   I have reviewed the labs performed to date as well as medications administered while in observation.  Recent changes in the last 24 hours include agitation overnight requiring sedation.  Plan  Current plan is for psychiatric placement.    Ezequiel Essex, MD 05/04/22 [phone]

## 2022-05-04 NOTE — Progress Notes (Signed)
[name] Progress Note  [id number]   Molly [name]  05/04/2022  11:25 AM  Description:   Inpatient Psychiatric Referral  Patient was recommended inpatient per Vesta Mixer, NP. [facility]. Pt continues on [facility] waitlist. Patient was also referred to the following facilities:   Destination  Service Provider Address Phone Fax  [facility]  [address], [place] [address] [phone]  [place] [address] [place] [address] [phone]  [place]  [place], [place] [address] [phone]  [facility]  [address] [place], [place] [address] [phone] [facility]  [address] [place] [address] [place]  [facility]  [address] [place] [address] [phone]  [facility]  [address] [place] [address] [phone]  [facility]  [address] [place] [address] [phone] [place]  [address] [place] [address] [phone] [facility]  [address], [place] [address] [phone]  [facility]  [address] [place] [address] [phone]  [facility]  [address] [place] [address] [phone]  [facility]  [address] [place] [address] [phone]  [facility]  [place], [place] [address] [place]  [facility] [address] [place] [address] [phone]  [facility]  [address] [address]  [place] [address] [phone]  [facility]  [address] [place] [address] [phone]  [facility]  [address] [place] [address] [phone] [facility]  [address] [place] [address] [phone]  [facility]  [address] [place] [address] [phone] [facility]  [address] [place] [address] [phone]  [facility]  [address], [place] [address] [phone] [facility]  [address] [place] [address] [phone]      Situation ongoing, CSW to continue following and update chart as more information becomes available.      Denna Haggard, [place]  05/04/2022 11:25 AM

## 2022-05-05 DIAGNOSIS — F259 Schizoaffective disorder, unspecified: Secondary | ICD-10-CM | POA: Diagnosis not present

## 2022-05-05 NOTE — ED Notes (Signed)
Pt refused VS at this time .

## 2022-05-05 NOTE — Progress Notes (Signed)
CSW spoke with Deanna in admissions at [facility] via phone call to inquire about pt's status on waitlist. Pt is currently on regular waitlist. CSW advocated for pt to be put on priority waitlist due to pt's high acuity and behaviors. CSW faxed updated nursing notes and provider notes to [facility] for review for priority wiatlist. CSW will continue to assist and follow with placement.  Denna Haggard, Latanya Presser  05/05/2022 12:59 PM

## 2022-05-05 NOTE — Progress Notes (Signed)
CSW received phone call from [facility] unit admissions. [facility] admissions reports that pt does not have appropriate diagnosis of IDD, therefore pt is denied. CSW will continue to assist and follow with placement.  Denna Haggard, Latanya Presser  05/05/2022 12:49 PM

## 2022-05-05 NOTE — Progress Notes (Signed)
CSW sent referral to [facility] for review. Pt is also under review at [facility]. CSW will continue to assist and follow with placement.   Denna Haggard, Latanya Presser  05/05/2022 9:53 AM

## 2022-05-05 NOTE — ED Notes (Signed)
IVC'd 05/01/22, exp 05/08/22, docs in purple zone

## 2022-05-05 NOTE — Progress Notes (Signed)
Attempted to get vitals on pt pt refused

## 2022-05-05 NOTE — ED Provider Notes (Signed)
Emergency Medicine Observation Re-evaluation Note  Molly [name] is a 37 y.o. female, seen on rounds today.  Pt initially presented to the ED for complaints of Medical Clearance Currently, the patient is walking around from bathroom.  Physical Exam  BP 126/83 (BP Location: Right Arm)   Pulse (!) 111   Temp 99.1 F (37.3 C) (Oral)   Resp 18   Ht 5\' 8"  (1.727 m)   Wt 123.8 kg   SpO2 98%   BMI 41.50 kg/m  Physical Exam General: No acute distress Cardiac: Slight tachycardia Lungs: No increased work of breathing Psych: Calm  ED Course / MDM  EKG:   I have reviewed the labs performed to date as well as medications administered while in observation.  Recent changes in the last 24 hours include none.  Plan  Current plan is for awaiting placement to behavioral health facility.    Malvin Johns, MD 05/05/22 [phone]

## 2022-05-05 NOTE — Consult Note (Signed)
Telepsych Consultation   Reason for Consult:  Psychiatric Reassessment  Referring Physician:  Godfrey Pick, MD  Location of Patient:    [name] Location of Provider: Other: virtual home office  Patient Identification: [name] MRN:  [id number] Principal Diagnosis: Schizoaffective disorder [facility]) Diagnosis:  Principal Problem:   Schizoaffective disorder ([place]) Active Problems:   Involuntary commitment   Total Time spent with patient: 30 minutes  Subjective:   [name] is a 37 y.o. female patient admitted with decompensated schizoaffective disorder and medication non-compliance. Patient is currently boarded in the emergency room awaiting [place] acceptance.  She's been at [name] for approximately 2 weeks and is followed by psychiatry.  Ms. Virag is seen daily for assessment and medication management as needed.    HPI:   Patient seen via telepsych by this provider; chart reviewed and consulted with Dr. Dwyane Dee on 05/05/22.  On evaluation [name] reports is seen in her room sitting on the bed with her legs dangled at the bedside.  Pt states hello when greeted by this Probation officer.  She has a sheet wrapped around her body, covering most of her torso and private area but her arms and legs are fully visible.  There are no sheets on her bed but seen in the distance at the corner of her bed.  When asked about her clothing and if she'd like scrubs or a hospital gown, pt grabs the sheets and covers her body with them.  She makes direct eye contact with this writer, appears tired AEB yawning.  Pt reports sleep and appetite are good, "I had spaghetti."  Pt asks if there's been any news from "[place]" and is told there are no updates.  Ms. Slingerland then turned away from the camera, got in bed and stated, "Im tired, thanks for talking to me."   Per nursing notes, within the past 24 hours, patient's mood has been impulsive, she's been refusing vital signs but has not demonstrated any physical aggressive  behaviors requiring IM prn medications.      During evaluation [name] is seated at the bedside; dangled; She is alert/oriented x 4; Pt is calm/cooperative; and mood congruent with affect.  Patient is speaking in a clear tone at moderate volume, and normal pace; with good eye contact.  Her thought process is mostly relevant; There is no indication that she is currently responding to internal/external stimuli.  Patient denies suicidal/self-harm/homicidal ideation.  Patient has remained calm throughout assessment and has answered questions appropriately.    Past Psychiatric History: Schizoaffective Disorder  Risk to Self:  yes Risk to Others:  yes Prior Inpatient Therapy:  yes,  Prior Outpatient Therapy:  unsure  Past Medical History:  Past Medical History:  Diagnosis Date   Abnormal Pap smear    ASC-cannot exclude HGSIL on Pap 02/15/2012   ASC-US on 02/03/2012 pap (associated Trichomonas infection). No reflex HPV testing performed on specimen.  Patient informed that she will need repeat Pap in one year.       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 Dennie Bible (Psych) At [facility] in  [place], [place]. 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   Qualifier: Diagnosis of  By: McDiarmid MD, Benjaman Pott USER 02/08/2009   Qualifier: Diagnosis of  By: Knox Royalty      Past Surgical History:  Procedure Laterality Date   INCISION AND DRAINAGE      pilanodal cyst   TIBIA IM NAIL INSERTION Right 10/26/2021   Procedure: INTRAMEDULLARY (IM) NAIL TIBIAL;  Surgeon: Myrene Galas, MD;  Location: [facility] OR;  Service: Orthopedics;  Laterality: Right;   TOOTH EXTRACTION N/A 10/07/2017   Procedure: EXTRACTION TEETH NUMBERS ONE, SEVENTEEN, NINETEEN AND THIRTY TWO;  Surgeon: Ocie Doyne, DDS;  Location: [facility] OR;  Service: Oral Surgery;  Laterality: N/A;   Family History:  Family History  Adopted: Yes  Problem Relation Age of Onset   Bipolar disorder Sister    Alcohol abuse Brother    Cancer Father    Diabetes Mother    Family Psychiatric  History: deferred Social History:  Social History   Substance and Sexual Activity  Alcohol Use No   Comment: occ     Social History   Substance and Sexual Activity  Drug Use No    Social History   Socioeconomic History   Marital status: Single    Spouse name: Not on file   Number of children: Not on file   Years of education: Not on file   Highest education level: Not on file  Occupational History   Not on file  Tobacco Use   Smoking status: Every Day    Packs/day: 2.00    Years: 10.00    Total pack years: 20.00    Types: Cigarettes   Smokeless tobacco: Never  Vaping Use   Vaping Use: Never used  Substance and Sexual Activity   Alcohol use: No    Comment: occ   Drug use: No   Sexual activity: Yes    Birth control/protection: Injection  Other Topics Concern   Not on file  Social History Narrative   Adopted   Has Guardian   Living in [place] home with Bradly Chris   Transportation: Bus   Social Determinants of Health   Financial Resource Strain: Not on file  Food Insecurity: Not on file  Transportation Needs: Not on file  Physical Activity: Not on file  Stress: Not on file  Social Connections: Not on file   Additional Social History:    Allergies:   Allergies  Allergen Reactions   Abilify [Aripiprazole] Other (See Comments)    Thinks it's nasty- does not want it.   Injection is ok.      Labs: No results found for this or any previous visit (from the past 48 hour(s)).  Medications:  Current Facility-Administered Medications  Medication Dose Route Frequency Provider Last Rate Last Admin   bacitracin ointment   Topical BID Pricilla Loveless, MD   1 Application at 05/04/22 2143   benztropine (COGENTIN) tablet 1 mg  1 mg Oral BID Gloris Manchester, MD   1 mg at 05/05/22 1038   divalproex (DEPAKOTE) DR tablet 1,250 mg  1,250 mg Oral BID Eligha Bridegroom, NP   1,250 mg at 05/05/22 1038   LORazepam (ATIVAN) tablet 1 mg  1 mg Oral Q6H PRN Effie Shy,  Mikaela, NP   1 mg at 05/04/22 2055   Or   LORazepam (ATIVAN) injection 2 mg  2 mg Intramuscular Q6H PRN Vesta Mixer, NP   2 mg at 04/29/22 1614   nicotine polacrilex (NICORETTE) gum 2 mg  2 mg Oral PRN Sherwood Gambler, MD   2 mg at 05/04/22 2317   OLANZapine (ZYPREXA) tablet 15 mg  15 mg Oral QHS Scot Jun, FNP   15 mg at 05/04/22 2139   OLANZapine zydis (ZYPREXA) disintegrating tablet 5 mg  5 mg Oral Daily Vesta Mixer, NP   5 mg at 05/05/22 1038   paliperidone (INVEGA) 24 hr tablet 3 mg  3 mg Oral Daily Vesta Mixer, NP   3 mg at 05/05/22 1038   traZODone (DESYREL) tablet 50 mg  50 mg Oral QHS PRN Vesta Mixer, NP   50 mg at 05/03/22 2119   ziprasidone (GEODON) injection 20 mg  20 mg Intramuscular Daily PRN Scot Jun, FNP   20 mg at 05/02/22 2019   Current Outpatient Medications  Medication Sig Dispense Refill   benztropine (COGENTIN) 1 MG tablet Take 1 mg by mouth 2 (two) times daily.     divalproex (DEPAKOTE) 500 MG DR tablet Take 1 tablet (500 mg total) by mouth 2 (two) times daily for 7 days. (Patient taking differently: Take 1,000-1,500 mg by mouth See admin instructions. 1000 mg in the morning, 1500 mg in the evening) 14 tablet 0   haloperidol (HALDOL) 10 MG tablet Take 1 tablet (10 mg total) by mouth 2 (two) times daily. 60 tablet 0   lisinopril (ZESTRIL) 5 MG tablet Take 5 mg by  mouth daily.     OLANZapine (ZYPREXA) 10 MG tablet Take 1 tablet (10 mg total) by mouth daily for 7 days. (Patient taking differently: Take 10 mg by mouth 2 (two) times daily.) 7 tablet 0   paliperidone (INVEGA SUSTENNA) 156 MG/ML SUSY injection Inject 156 mg into the muscle once. (Patient not taking: Reported on 04/13/2022)      Musculoskeletal: Patient   moves all exremities and ambulates independently.  Strength & Muscle Tone: within normal limits Gait & Station: normal Patient leans: N/A   Psychiatric Specialty Exam:  Presentation  General Appearance:  Bizarre (barely clothed and sitting dangled at the bedside)  Eye Contact: Fair  Speech: Clear and Coherent; Normal Rate  Speech Volume: Normal  Handedness: Right   Mood and Affect  Mood: Euthymic  Affect: Congruent   Thought Process  Thought Processes: Coherent  Descriptions of Associations:Intact  Orientation:Partial  Thought Content:Illogical (AEB by her attire)  History of Schizophrenia/Schizoaffective disorder:Yes  Duration of Psychotic Symptoms:Greater than six months  Hallucinations:Hallucinations: None  Ideas of Reference:Delusions  Suicidal Thoughts:Suicidal Thoughts: No  Homicidal Thoughts:Homicidal Thoughts: No   Sensorium  Memory: Immediate Fair; Recent Fair; Remote Fair  Judgment: Poor  Insight: Lacking   Executive Functions  Concentration: Fair  Attention Span: Fair  Recall: [place] of Knowledge: Fair  Language: Good   Psychomotor Activity  Psychomotor Activity:Psychomotor Activity: Normal   Assets  Assets: Communication Skills; Desire for Improvement; Financial Resources/Insurance   Sleep  Sleep:Sleep: Good Number of Hours of Sleep: 8    Physical Exam: Physical Exam Vitals and nursing note reviewed.  Cardiovascular:     Rate and Rhythm: Normal rate.  Pulmonary:     Effort: Pulmonary effort is normal.  Musculoskeletal:        General:  Normal range of motion.  Cervical back: Normal range of motion.  Neurological:     Mental Status: She is alert.  Psychiatric:        Attention and Perception: Attention and perception normal.        Mood and Affect: Mood is depressed. Affect is blunt.        Speech: Speech normal.        Behavior: Behavior is cooperative.        Cognition and Memory: Cognition is impaired.        Judgment: Judgment is impulsive.    Review of Systems  Constitutional: Negative.   HENT: Negative.    Eyes: Negative.   Respiratory: Negative.    Cardiovascular: Negative.   Gastrointestinal: Negative.   Genitourinary: Negative.   Musculoskeletal: Negative.   Skin: Negative.   Neurological: Negative.   Endo/Heme/Allergies: Negative.   Psychiatric/Behavioral:  The patient is nervous/anxious.    Blood pressure 126/83, pulse (!) 111, temperature 99.1 F (37.3 C), temperature source Oral, resp. rate 18, height 5\' 8"  (1.727 m), weight 123.8 kg, SpO2 98 %, unknown if currently breastfeeding. Body mass index is 41.5 kg/m.  Treatment Plan Summary: Patient case discussed with Dr. ; patient continues to meet criteria for inpatient psychiatric treatment.  Patient is unable to reliably contract for safety at this time. Patient remains on the [facility] wait list and SW has been diligently working to make get her on the [facility] priority list.  While awaiting placement, psychiatry will continue to follow patient for medication adjustments as needed.    Daily contact with patient to assess and evaluate symptoms and progress in treatment and Medication management  There are no medication changes made today. Continue medications as prescribed.   Disposition: Recommend psychiatric Inpatient admission when medically cleared.  This service was provided via telemedicine using a 2-way, interactive audio and video technology.  Names of all persons participating in this telemedicine service and their role in this  encounter. Name: Joene Gelder  Role: Patient   Name: Biagio Borg Role: PMHNP  Name: Ophelia Shoulder Role: Psychiatrist    Nelly Rout, NP 05/05/2022 4:01 PM

## 2022-05-05 NOTE — Progress Notes (Incomplete)
Inpatient Behavioral Health Placement  Meets inpatient criteria per , NP.  Referral was sent to the following facilities;     Situation ongoing,  CSW will follow up.   Benjaman Kindler, MSW, [facility] 05/05/2022  @ 10:54 PM

## 2022-05-05 NOTE — ED Notes (Signed)
Pt was given a brief and pt refused to give RN or sitter the old drenched brief. Pt ripped out the absorbent part from the brief and threw it in the trash.

## 2022-05-06 DIAGNOSIS — F259 Schizoaffective disorder, unspecified: Secondary | ICD-10-CM | POA: Diagnosis not present

## 2022-05-06 NOTE — ED Notes (Signed)
PT refused vital signs

## 2022-05-06 NOTE — ED Notes (Signed)
Pt sleeping. 

## 2022-05-06 NOTE — BHH Counselor (Signed)
BHH/BMU [name] Progress Note   05/06/2022    12:46 PM  Caeley Dohrmann   [id number]   Type of Contact and Topic:  Pre admit Care Team meeting  CSW requested by Dr. Dwyane Dee, Dr. Caswell Corwin, Jamey Ripa, and other care team members to set up meeting to discuss expectations and care team goals for patient regarding admission to [facility].    CSW spoke with patient ACTT  Team Lead, Annie Main and her guardian Regino Schultze.  Meeting time set up at 2pm via WebEx Friday 05/07/2022.  Link to meeting sent to appropriate social supports and care team.    Background information provided from Annie Main, Team Lead  Team Lead provided that when patient is not doing well she can be overtly sexual and undress in inappropriate places.  He reports that she can also be more paranoid.  He reports that baseline she still does not do well.  He reports that he believes patient may need a psych evaluation to determine if there is more going on than psychosis, including possible IDD.  Annie Main continued to discuss that her impulsivity and inability to make decisions keeps her from being able to live safely in the environment.  They all are recommending a [place] hospitalization but understand that it is near impossible to get her in.    Group homes have been tried/offered in the past and patient has [facility].  Patient refuses to go to any of these supported systems, preferring to be on the streets.  He reports that she did well at one point at a hotel but that eventually she would undress and be inappropriate and she was kicked out.    Patient will be offered tents/and resources and within a day she won't have any of it because she will give them away for something like a cigarette.   Patient will mumble to herself which could be responding to internal stimuli but could also be her just mumbling to herself.     Signed:  Riki Altes MSW, [name], LCAS 05/06/2022 12:46 PM

## 2022-05-06 NOTE — ED Notes (Signed)
IVC status verified

## 2022-05-06 NOTE — ED Provider Notes (Signed)
Emergency Medicine Observation Re-evaluation Note  Chastin Garlitz is a 37 y.o. female, seen on rounds today.  Pt initially presented to the ED for complaints of Medical Clearance Currently, the patient is sleeping.  Physical Exam  BP 124/83 (BP Location: Left Wrist)   Pulse 89   Temp 99.2 F (37.3 C)   Resp 18   Ht 5\' 8"  (1.727 m)   Wt 123.8 kg   SpO2 99%   BMI 41.50 kg/m  Physical Exam General: No distress Cardiac: Well-perfused Lungs: No increased work of breathing Psych: Calm  ED Course / MDM  EKG:   I have reviewed the labs performed to date as well as medications administered while in observation.  Recent changes in the last 24 hours include none.  Plan  Current plan is for psychiatric placement.    Ezequiel Essex, MD 05/06/22 [phone]

## 2022-05-07 DIAGNOSIS — F259 Schizoaffective disorder, unspecified: Secondary | ICD-10-CM | POA: Diagnosis not present

## 2022-05-07 NOTE — ED Notes (Signed)
Pt on phone leaving message about food stamp card stating that brother has it.

## 2022-05-07 NOTE — ED Notes (Signed)
Pt in shower.  

## 2022-05-07 NOTE — BHH Counselor (Signed)
BHH/BMU [name] Progress Note   05/07/2022    3:14 PM  [name]   [id number]   Type of Contact and Topic:  Pre-admit meeting  Dr. Caswell Corwin, Jamey Ripa, ACTT lead: Annie Main, Housing Specialist: Shauna Hugh, Boris Lown and this CSW attended pre- admit meeting to discuss expectations for patient care while on inpatient hospital.  Patient baseline was discussed as well as areas that it can be improved.  Patient disposition plan was also discussed.  Social Supports indicated that barriers of appropriate disposition has been with them feeling that patient does not have proper diagnosis to get appropriate level of care and resources.  Patient is well connected with resources but the resources have not been appropriate due to cognitive decline and continues the cycle of patient ending up in jail and emergency rooms.    Discussed at meeting was 1. For a more appropriate diagnosis to be established for patient (that can be appropriately diagnosed in hospital setting) 2. ACTT and guardian to seek disposition placement that are more appropriate for new diagnoses 3. CSW will help facilitation and communication with social supports and patient care team.   After patient is stabilized patient will need this level of care however there was consensus by all parties that the alternative discharge plan would have to be shelter and ongoing stabilization with supports that are already in place.      Signed:  Riki Altes MSW, [name], LCAS 05/07/2022 3:14 PM

## 2022-05-07 NOTE — Consult Note (Incomplete)
Telepsych Consultation   Reason for Consult:  Psych Reassessment  Referring Physician:  Dr. Gloris Manchester  Location of Patient:    [name] Location of Provider: Other: virtual home office  Patient Identification: Molly [name] MRN:  [id number] Principal Diagnosis: Schizoaffective disorder [facility]) Diagnosis:  Principal Problem:   Schizoaffective disorder (HCC) Active Problems:   Involuntary commitment   Total Time spent with patient: 20 minutes  Subjective:   Molly [name] is a 37 y.o. female patient admitted with decompensated schizoaffective disorder and medication non-compliance. Patient is currently boarded in the emergency room awaiting [facility] acceptance.  She's been at [facility] Ed for approximately since weeks and is followed by psychiatry.  Ms. Ocasio is seen daily for assessment and medication management as needed.    HPI:   Patient seen via telepsych by this provider; chart reviewed and consulted with Dr. Lucianne Muss on 05/07/22.  On evaluation Molly [name] is seen laying in bed, facing the wall.  When greeted by this writer she turns around to face the camera and make direct eye contact with this Clinical research associate.  She's wearing hospital scrubs but has the sheets wrapped around her. She greets this Clinical research associate with a soft spoken voice, "hi." Reports she slept good last night and her appetite is good.    Per nursing notes, patient has been refusing vital signs.  When discussed with her today, she states sometimes she's not up to it.  Discussed the importance of obtaining her vital signs as it helps [place] care for her.  To this she nodded her head to say yes and agreed to let staff get her vitals.  Patent asks about "[facility]" and asks if we have any new today.  Provided reassurance and informed patient psychiatry will keep her updated when new information becomes available.   During evaluation Molly [name] is laying in bed;  She is alert/oriented x 4; Pt is calm/cooperative; and mood congruent with affect.  Patient  is speaking in a clear tone at moderate volume, and normal pace; with good eye contact.  Her thought process is relevant; There is no indication that she is currently responding to internal/external stimuli.  Patient denies suicidal/self-harm/homicidal ideation.  Patient has remained calm throughout assessment and has answered questions appropriately.    Past Psychiatric History: Schizoaffective Disorder  Risk to Self:  denies Risk to Others:  denies Prior Inpatient Therapy:  yes,  Prior Outpatient Therapy:  unsure  Past Medical History:  Past Medical History:  Diagnosis Date  . Abnormal Pap smear   . ASC-cannot exclude HGSIL on Pap 02/15/2012   ASC-US on 02/03/2012 pap (associated Trichomonas infection). No reflex HPV testing performed on specimen.  Patient informed that she will need repeat Pap in one year.      . Asthma   . ATTENTION DEFICIT, W/O HYPERACTIVITY, History of 06/30/2006   Qualifier: History of  By: McDiarmid MD, Tawanna Cooler    . CERVICITIS, GONOCOCCAL, History of 01/09/2007   Qualifier: History of  By: McDiarmid MD, Tawanna Cooler    . 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 Depression   . Diabetes mellitus    diet controlled  . Eczema   . Hypertension   . Overactive bladder   . Schizophrenia (HCC)   . 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 Dennie Bible (Psych) At [facility] in  [place], [place]. Qualifier: Hospitalized for  By: McDiarmid  MD, Sherren Mocha    . 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 TOBACCO USER 02/08/2009   Qualifier: Diagnosis of  By: Samara Snide      Past Surgical History:  Procedure Laterality Date  . INCISION AND DRAINAGE     pilanodal cyst  . TIBIA IM NAIL INSERTION Right 10/26/2021   Procedure: INTRAMEDULLARY (IM) NAIL TIBIAL;  Surgeon: [place], MD;  Location: [place];  Service: Orthopedics;   Laterality: Right;  . TOOTH EXTRACTION N/A 10/07/2017   Procedure: EXTRACTION TEETH NUMBERS ONE, SEVENTEEN, NINETEEN AND THIRTY TWO;  Surgeon: Diona Browner, DDS;  Location: [place];  Service: Oral Surgery;  Laterality: N/A;   Family History:  Family History  Adopted: Yes  Problem Relation Age of Onset  . Bipolar disorder Sister   . Alcohol abuse Brother   . Cancer Father   . Diabetes Mother    Family Psychiatric  History: deferred Social History:  Social History   Substance and Sexual Activity  Alcohol Use No   Comment: occ     Social History   Substance and Sexual Activity  Drug Use No    Social History   Socioeconomic History  . Marital status: Single    Spouse name: Not on file  . Number of children: Not on file  . Years of education: Not on file  . Highest education level: Not on file  Occupational History  . Not on file  Tobacco Use  . Smoking status: Every Day    Packs/day: 2.00    Years: 10.00    Total pack years: 20.00    Types: Cigarettes  . Smokeless tobacco: Never  Vaping Use  . Vaping Use: Never used  Substance and Sexual Activity  . Alcohol use: No    Comment: occ  . Drug use: No  . Sexual activity: Yes    Birth control/protection: Injection  Other Topics Concern  . Not on file  Social History Narrative   Adopted   Has Guardian   Living in [place] home with [name]   Transportation: Bus   Social Determinants of Health   Financial Resource Strain: Not on file  Food Insecurity: Not on file  Transportation Needs: Not on file  Physical Activity: Not on file  Stress: Not on file  Social Connections: Not on file   Additional Social History:    Allergies:   Allergies  Allergen Reactions  . Abilify [Aripiprazole] Other (See Comments)    Thinks it's nasty- does not want it.  Injection is ok.      Labs: No results found for this or any previous visit (from the past 48 hour(s)).  Medications:  Current Facility-Administered  Medications  Medication Dose Route Frequency Provider Last Rate Last Admin  . bacitracin ointment   Topical BID Sherwood Gambler, MD   1 Application at 30/86/57 2143  . benztropine (COGENTIN) tablet 1 mg  1 mg Oral BID Godfrey Pick, MD   1 mg at 05/07/22 1023  . divalproex (DEPAKOTE) DR tablet 1,250 mg  1,250 mg Oral BID Vesta Mixer, NP   1,250 mg at 05/07/22 1023  . LORazepam (ATIVAN) tablet 1 mg  1 mg Oral Q6H PRN Vesta Mixer, NP   1 mg at 05/06/22 1808   Or  . LORazepam (ATIVAN) injection 2 mg  2 mg Intramuscular Q6H PRN Vesta Mixer, NP   2 mg at 04/29/22 1614  . nicotine polacrilex (NICORETTE)  gum 2 mg  2 mg Oral PRN Sherwood Gambler, MD   2 mg at 05/06/22 2138  . OLANZapine (ZYPREXA) tablet 15 mg  15 mg Oral QHS Scot Jun, FNP   15 mg at 05/06/22 2128  . OLANZapine zydis (ZYPREXA) disintegrating tablet 5 mg  5 mg Oral Daily Vesta Mixer, NP   5 mg at 05/07/22 1023  . paliperidone (INVEGA) 24 hr tablet 3 mg  3 mg Oral Daily Vesta Mixer, NP   3 mg at 05/07/22 1023  . traZODone (DESYREL) tablet 50 mg  50 mg Oral QHS PRN Vesta Mixer, NP   50 mg at 05/06/22 2129  . ziprasidone (GEODON) injection 20 mg  20 mg Intramuscular Daily PRN Scot Jun, FNP   20 mg at 05/02/22 2019   Current Outpatient Medications  Medication Sig Dispense Refill  . benztropine (COGENTIN) 1 MG tablet Take 1 mg by mouth 2 (two) times daily.    . divalproex (DEPAKOTE) 500 MG DR tablet Take 1 tablet (500 mg total) by mouth 2 (two) times daily for 7 days. (Patient taking differently: Take 1,000-1,500 mg by mouth See admin instructions. 1000 mg in the morning, 1500 mg in the evening) 14 tablet 0  . haloperidol (HALDOL) 10 MG tablet Take 1 tablet (10 mg total) by mouth 2 (two) times daily. 60 tablet 0  . lisinopril (ZESTRIL) 5 MG tablet Take 5 mg by mouth daily.    Marland Kitchen OLANZapine (ZYPREXA) 10 MG tablet Take 1 tablet (10 mg total) by mouth daily for 7 days. (Patient taking differently:  Take 10 mg by mouth 2 (two) times daily.) 7 tablet 0  . paliperidone (INVEGA SUSTENNA) 156 MG/ML SUSY injection Inject 156 mg into the muscle once. (Patient not taking: Reported on 04/13/2022)      Musculoskeletal: Patient   moves all exremities and ambulates independently.  Strength & Muscle Tone: within normal limits Gait & Station: normal Patient leans: N/A   Psychiatric Specialty Exam:  Presentation  General Appearance:  Casual; Appropriate for Environment  Eye Contact: Good  Speech: Clear and Coherent  Speech Volume: Decreased  Handedness: Right   Mood and Affect  Mood: Euthymic (patient laughs appropriately when discussing getting vital signs,)  Affect: Appropriate; Congruent   Thought Process  Thought Processes: Coherent  Descriptions of Associations:Intact  Orientation:Partial  Thought Content:Logical  History of Schizophrenia/Schizoaffective disorder:Yes  Duration of Psychotic Symptoms:Greater than six months  Hallucinations:Hallucinations: None   Ideas of Reference:Delusions  Suicidal Thoughts:Suicidal Thoughts: No   Homicidal Thoughts:Homicidal Thoughts: No    Sensorium  Memory: Immediate Fair; Remote Fair; Recent Fair  Judgment: -- (impulsive)  Insight: Lacking   Executive Functions  Concentration: Fair  Attention Span: Fair  Recall: [employer] of Knowledge: Fair  Language: Fair   Psychomotor Activity  Psychomotor Activity:Psychomotor Activity: Normal    Assets  Assets: Armed forces logistics/support/administrative officer; Desire for Improvement; Financial Resources/Insurance   Sleep  Sleep:Sleep: Good Number of Hours of Sleep: 8     Physical Exam: Physical Exam Vitals and nursing note reviewed.  Cardiovascular:     Rate and Rhythm: Normal rate.  Pulmonary:     Effort: Pulmonary effort is normal.  Musculoskeletal:        General: Normal range of motion.     Cervical back: Normal range of motion.  Neurological:      Mental Status: She is alert.  Psychiatric:        Attention and Perception: Attention and perception normal.  Mood and Affect: Mood is depressed. Affect is blunt.        Speech: Speech normal.        Behavior: Behavior is cooperative.        Cognition and Memory: Cognition is impaired.        Judgment: Judgment is impulsive.    Review of Systems  Constitutional: Negative.   HENT: Negative.    Eyes: Negative.   Respiratory: Negative.    Cardiovascular: Negative.   Gastrointestinal: Negative.   Genitourinary: Negative.   Musculoskeletal: Negative.   Skin: Negative.   Neurological: Negative.   Endo/Heme/Allergies: Negative.   Psychiatric/Behavioral:  The patient is nervous/anxious.    Blood pressure 112/73, pulse 85, temperature 98.1 F (36.7 C), temperature source Oral, resp. rate 18, height 5\' 8"  (1.727 m), weight 123.8 kg, SpO2 100 %, unknown if currently breastfeeding. Body mass index is 41.5 kg/m.  Treatment Plan Summary: Patient reviewed and case discussed with Dr. .   Patient meets inpatient admission and is awaiting acceptance to [facility].   Patient is not on [facility] priority list because her aggressive behaviors have significantly improved, she remains impulsive but has not demonstrated aggressive behavioral concerns; and has not required IM medications for agitation.  There's been 2 inpatient care coordination meetings (05/06/2021 and  05/07/2021) to discuss patient's status and possible transfer to [facility] inpatient for continued care and stabilization. At present, patient continues to remain in the emergency department and no has not been accepted to [facility].    While awaiting placement, psychiatry will continue to follow patient for medication adjustments as needed.    Daily contact with patient to assess and evaluate symptoms and progress in treatment and Medication management  Continue medications as prescribed.   Disposition: Recommend psychiatric Inpatient admission when  medically cleared.  This service was provided via telemedicine using a 2-way, interactive audio and video technology.  Names of all persons participating in this telemedicine service and their role in this encounter. Name: Molly [name]  Role: Patient   Name: Biagio Borg Role: PMHNP  Name: Ophelia Shoulder Role: Psychiatrist    Nelly Rout, NP 05/07/2022 12:04 PM

## 2022-05-07 NOTE — ED Notes (Signed)
Dr is aware of the [facility]c paperwork expiring tomorrow

## 2022-05-07 NOTE — ED Notes (Signed)
Pt was on phone for about 15 min like she was on hold or waiting for an answer.  Eventually she left another message about cancelling food stamp card.  She sounded a bit perturbed.  She is now back in room sitting on bed quietly.

## 2022-05-07 NOTE — ED Provider Notes (Signed)
Emergency Medicine Observation Re-evaluation Note  Molly [name] is a 37 y.o. female, seen on rounds today.  Pt initially presented to the ED for complaints of Medical Clearance Currently, the patient is sleeping.  Pt has been waiting for several days for placement.  Physical Exam  BP 112/73 (BP Location: Left Arm)   Pulse 85   Temp 98.1 F (36.7 C) (Oral)   Resp 18   Ht 5\' 8"  (1.727 m)   Wt 123.8 kg   SpO2 100%   BMI 41.50 kg/m  Physical Exam General: asleep Cardiac: rr Lungs: clear Psych: asleep  ED Course / MDM  EKG:   I have reviewed the labs performed to date as well as medications administered while in observation.  Recent changes in the last 24 hours include none.  Plan  Current plan is for awaiting placement.    Isla Pence, MD 05/07/22 [phone]

## 2022-05-07 NOTE — ED Notes (Signed)
[facility] paperwork expires tomorrow

## 2022-05-07 NOTE — Consult Note (Signed)
Telepsych Consultation   Reason for Consult:  Psychiatric Reassessment  Referring Physician:  Godfrey Pick, MD  Location of Patient:    [facility] Location of Provider: Other: virtual home office  Patient Identification: Molly [name] MRN:  [id number] Principal Diagnosis: Schizoaffective disorder [facility]) Diagnosis:  Principal Problem:   Schizoaffective disorder ([place]) Active Problems:   Involuntary commitment   Total Time spent with patient: 30 minutes  Subjective:   Molly [name] is a 37 y.o. female patient admitted with decompensated schizoaffective disorder and medication non-compliance. Patient is currently boarded in the emergency room awaiting [facility] acceptance.  She's been at [facility] for approximately 2 weeks and is followed by psychiatry.  Molly [name] is seen daily for assessment and medication management as needed.    HPI:   Patient seen via telepsych by this provider; chart reviewed and consulted with Dr. Dwyane Dee on 05/07/22.  On evaluation Molly [name] is seen laying in bed, facing the wall.  When greeted by this writer she turns around to face the camera and make direct eye contact with this Probation officer.  She's wearing hospital scrubs but has the sheets wrapped around her. She greets this Probation officer with a soft spoken voice, "hi." Reports she slept good last night and appetite is good.    Per nursing notes, patient has been refusing vital signs.  When discussed with her today, she states sometimes she's not up to it.  Discussed the importance of obtaining her vital signs as it helps [place] care for her.  To this she nodded her head to say yes and agreed to let staff get her vitals.    During evaluation Molly [name] is seated at the bedside; dangled; She is alert/oriented x 4; Pt is calm/cooperative; and mood congruent with affect.  Patient is speaking in a clear tone at moderate volume, and normal pace; with good eye contact.  Her thought process is mostly relevant; There is no indication that she  is currently responding to internal/external stimuli.  Patient denies suicidal/self-harm/homicidal ideation.  Patient has remained calm throughout assessment and has answered questions appropriately.    Past Psychiatric History: Schizoaffective Disorder  Risk to Self:  yes Risk to Others:  yes Prior Inpatient Therapy:  yes,  Prior Outpatient Therapy:  unsure  Past Medical History:  Past Medical History:  Diagnosis Date   Abnormal Pap smear    ASC-cannot exclude HGSIL on Pap 02/15/2012   ASC-US on 02/03/2012 pap (associated Trichomonas infection). No reflex HPV testing performed on specimen.  Patient informed that she will need repeat Pap in one year.       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 Henrene Dodge (Psych) At [facility] in  [place], [place]. 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   Qualifier: Diagnosis of  By: McDiarmid MD, Jolyn Nap USER 02/08/2009  Qualifier: Diagnosis of  By: Samara Snide      Past Surgical History:  Procedure Laterality Date   INCISION AND DRAINAGE     pilanodal cyst   TIBIA IM NAIL INSERTION Right 10/26/2021   Procedure: INTRAMEDULLARY (IM) NAIL TIBIAL;  Surgeon: Altamese , MD;  Location: [place];  Service: Orthopedics;  Laterality: Right;   TOOTH EXTRACTION N/A 10/07/2017   Procedure: EXTRACTION TEETH NUMBERS ONE, SEVENTEEN, NINETEEN AND THIRTY TWO;  Surgeon: Diona Browner, DDS;  Location: [facility];   Service: Oral Surgery;  Laterality: N/A;   Family History:  Family History  Adopted: Yes  Problem Relation Age of Onset   Bipolar disorder Sister    Alcohol abuse Brother    Cancer Father    Diabetes Mother    Family Psychiatric  History: deferred Social History:  Social History   Substance and Sexual Activity  Alcohol Use No   Comment: occ     Social History   Substance and Sexual Activity  Drug Use No    Social History   Socioeconomic History   Marital status: Single    Spouse name: Not on file   Number of children: Not on file   Years of education: Not on file   Highest education level: Not on file  Occupational History   Not on file  Tobacco Use   Smoking status: Every Day    Packs/day: 2.00    Years: 10.00    Total pack years: 20.00    Types: Cigarettes   Smokeless tobacco: Never  Vaping Use   Vaping Use: Never used  Substance and Sexual Activity   Alcohol use: No    Comment: occ   Drug use: No   Sexual activity: Yes    Birth control/protection: Injection  Other Topics Concern   Not on file  Social History Narrative   Adopted   Has Guardian   Living in [place] home with [name]   Transportation: Bus   Social Determinants of Health   Financial Resource Strain: Not on file  Food Insecurity: Not on file  Transportation Needs: Not on file  Physical Activity: Not on file  Stress: Not on file  Social Connections: Not on file   Additional Social History:    Allergies:   Allergies  Allergen Reactions   Abilify [Aripiprazole] Other (See Comments)    Thinks it's nasty- does not want it.  Injection is ok.      Labs: No results found for this or any previous visit (from the past 48 hour(s)).  Medications:  Current Facility-Administered Medications  Medication Dose Route Frequency Provider Last Rate Last Admin   bacitracin ointment   Topical BID Sherwood Gambler, MD   1 Application at [id number]   benztropine (COGENTIN) tablet 1 mg  1  mg Oral BID Godfrey Pick, MD   1 mg at 05/07/22 1023   divalproex (DEPAKOTE) DR tablet 1,250 mg  1,250 mg Oral BID Vesta Mixer, NP   1,250 mg at 05/07/22 1023   LORazepam (ATIVAN) tablet 1 mg  1 mg Oral Q6H PRN Vesta Mixer, NP   1 mg at 05/06/22 1808   Or   LORazepam (ATIVAN) injection 2 mg  2 mg Intramuscular Q6H PRN Vesta Mixer, NP   2 mg at 04/29/22 1614   nicotine polacrilex (NICORETTE) gum 2 mg  2 mg Oral PRN Sherwood Gambler, MD   2 mg at 05/06/22 2138   OLANZapine (ZYPREXA) tablet 15 mg  15 mg Oral  QHS Scot Jun, FNP   15 mg at 05/06/22 2128   OLANZapine zydis (ZYPREXA) disintegrating tablet 5 mg  5 mg Oral Daily Vesta Mixer, NP   5 mg at 05/07/22 1023   paliperidone (INVEGA) 24 hr tablet 3 mg  3 mg Oral Daily Vesta Mixer, NP   3 mg at 05/07/22 1023   traZODone (DESYREL) tablet 50 mg  50 mg Oral QHS PRN Vesta Mixer, NP   50 mg at 05/06/22 2129   ziprasidone (GEODON) injection 20 mg  20 mg Intramuscular Daily PRN Scot Jun, FNP   20 mg at 05/02/22 2019   Current Outpatient Medications  Medication Sig Dispense Refill   benztropine (COGENTIN) 1 MG tablet Take 1 mg by mouth 2 (two) times daily.     divalproex (DEPAKOTE) 500 MG DR tablet Take 1 tablet (500 mg total) by mouth 2 (two) times daily for 7 days. (Patient taking differently: Take 1,000-1,500 mg by mouth See admin instructions. 1000 mg in the morning, 1500 mg in the evening) 14 tablet 0   haloperidol (HALDOL) 10 MG tablet Take 1 tablet (10 mg total) by mouth 2 (two) times daily. 60 tablet 0   lisinopril (ZESTRIL) 5 MG tablet Take 5 mg by mouth daily.     OLANZapine (ZYPREXA) 10 MG tablet Take 1 tablet (10 mg total) by mouth daily for 7 days. (Patient taking differently: Take 10 mg by mouth 2 (two) times daily.) 7 tablet 0   paliperidone (INVEGA SUSTENNA) 156 MG/ML SUSY injection Inject 156 mg into the muscle once. (Patient not taking: Reported on 04/13/2022)      Musculoskeletal:  Patient   moves all exremities and ambulates independently.  Strength & Muscle Tone: within normal limits Gait & Station: normal Patient leans: N/A   Psychiatric Specialty Exam:  Presentation  General Appearance:  Casual; Appropriate for Environment  Eye Contact: Good  Speech: Clear and Coherent  Speech Volume: Decreased  Handedness: Right   Mood and Affect  Mood: Euthymic (patient laughs appropriately when discussing getting vital signs,)  Affect: Appropriate; Congruent   Thought Process  Thought Processes: Coherent  Descriptions of Associations:Intact  Orientation:Partial  Thought Content:Logical  History of Schizophrenia/Schizoaffective disorder:Yes  Duration of Psychotic Symptoms:Greater than six months  Hallucinations:Hallucinations: None   Ideas of Reference:Delusions  Suicidal Thoughts:Suicidal Thoughts: No   Homicidal Thoughts:Homicidal Thoughts: No    Sensorium  Memory: Immediate Fair; Remote Fair; Recent Fair  Judgment: -- (impulsive)  Insight: Lacking   Executive Functions  Concentration: Fair  Attention Span: Fair  Recall: [employer] of Knowledge: Fair  Language: Fair   Psychomotor Activity  Psychomotor Activity:Psychomotor Activity: Normal    Assets  Assets: Armed forces logistics/support/administrative officer; Desire for Improvement; Financial Resources/Insurance   Sleep  Sleep:Sleep: Good Number of Hours of Sleep: 8     Physical Exam: Physical Exam Vitals and nursing note reviewed.  Cardiovascular:     Rate and Rhythm: Normal rate.  Pulmonary:     Effort: Pulmonary effort is normal.  Musculoskeletal:        General: Normal range of motion.     Cervical back: Normal range of motion.  Neurological:     Mental Status: She is alert.  Psychiatric:        Attention and Perception: Attention and perception normal.        Mood and Affect: Mood is depressed. Affect is blunt.        Speech: Speech normal.  Behavior:  Behavior is cooperative.        Cognition and Memory: Cognition is impaired.        Judgment: Judgment is impulsive.    Review of Systems  Constitutional: Negative.   HENT: Negative.    Eyes: Negative.   Respiratory: Negative.    Cardiovascular: Negative.   Gastrointestinal: Negative.   Genitourinary: Negative.   Musculoskeletal: Negative.   Skin: Negative.   Neurological: Negative.   Endo/Heme/Allergies: Negative.   Psychiatric/Behavioral:  The patient is nervous/anxious.    Blood pressure 112/73, pulse 85, temperature 98.1 F (36.7 C), temperature source Oral, resp. rate 18, height 5\' 8"  (1.727 m), weight 123.8 kg, SpO2 100 %, unknown if currently breastfeeding. Body mass index is 41.5 kg/m.  Treatment Plan Summary: Patient case discussed with Dr. Dwyane Dee; patient continues to meet criteria for inpatient psychiatric treatment.  Patient is unable to reliably contract for safety at this time. Patient remains on the [facility] wait list and SW has been diligently working to make get her on the [facility] priority list.  While awaiting placement, psychiatry will continue to follow patient for medication adjustments as needed.    Daily contact with patient to assess and evaluate symptoms and progress in treatment and Medication management  There are no medication changes made today. Continue medications as prescribed.   Disposition: Recommend psychiatric Inpatient admission when medically cleared.  This service was provided via telemedicine using a 2-way, interactive audio and video technology.  Names of all persons participating in this telemedicine service and their role in this encounter. Name: Aliesha Kreie  Role: Patient   Name: Merlyn Lot Role: [place]  Name: Hampton Abbot Role: Psychiatrist    Mallie Darting, NP 05/07/2022 12:04 PM

## 2022-05-07 NOTE — ED Provider Notes (Signed)
IVC renewed   Wyvonnia Dusky, MD 05/07/22 [phone]

## 2022-05-08 DIAGNOSIS — F259 Schizoaffective disorder, unspecified: Secondary | ICD-10-CM | POA: Diagnosis not present

## 2022-05-08 LAB — SARS CORONAVIRUS 2 BY RT PCR: SARS Coronavirus 2 by RT PCR: NEGATIVE

## 2022-05-08 LAB — CBG MONITORING, ED: Glucose-Capillary: 123 mg/dL — ABNORMAL HIGH (ref 70–99)

## 2022-05-08 NOTE — Consult Note (Signed)
  Per Mariea Clonts, [name]  "Type of Contact and Topic:  Psychiatric Bed Placement  Pt accepted to [facility]     Patient meets inpatient criteria per Vesta Mixer, NP The attending provider will be Flossie Buffy, MD  Call report to [phone] Wyatt Mage, RN @ [facility] ED notified.    Pt scheduled  to arrive at [place] ANYTIME."  Wyatt Mage, RN reported that [employer] will be unable to transfer today due to weather conditions, and transportation will need to be arranged for the morning.

## 2022-05-08 NOTE — ED Notes (Signed)
Pt to desk requesting phone call. Phone call made.

## 2022-05-08 NOTE — ED Notes (Signed)
Patient asleep at this time,  refused vital sign to be check.

## 2022-05-08 NOTE — ED Notes (Signed)
While attempting med pass pt was out at desk attempting 3rd phone called. This RN attempted to tell pt she is not allowed another phone call at this time. Pt yelled at this RN "don't come at me like that". Pt back to room. While scanning pts arm band for med administration pt yelled at this RN "don;t touch me bitch just give me my meds" putting hands up to this RN's chest. This RN explained that pt has not been touched. Pt put all meds in mouth and chewed pills. Attempted education, pt yelling and using foul language towards this RN. Pt sitting up in bed yelling.

## 2022-05-08 NOTE — ED Notes (Signed)
Called admissions and was told to call back and leave a VM with a phone number on the pager line.  The admission department stated there was 3 ppl she needed to get report from first

## 2022-05-08 NOTE — ED Notes (Signed)
[facility] requesting an updated report when pt is ready to be transported to [facility] at [phone].

## 2022-05-08 NOTE — ED Provider Notes (Signed)
Emergency Medicine Observation Re-evaluation Note  Molly [name] is a 37 y.o. female, seen on rounds today.  Pt initially presented to the ED for complaints of Medical Clearance Currently, the patient is sleeping.  Physical Exam  BP 107/87 (BP Location: Right Wrist)   Pulse 100   Temp 98.3 F (36.8 C) (Oral)   Resp 20   Ht 5\' 8"  (1.727 m)   Wt 123.8 kg   SpO2 98%   BMI 41.50 kg/m  Physical Exam General: No acute distress Cardiac: Normal rate Lungs: No increased work of breathing Psych: Calm  ED Course / MDM  EKG:   I have reviewed the labs performed to date as well as medications administered while in observation.  Recent changes in the last 24 hours include none.  Plan  Current plan is for inpatient psychiatric treatment.  It appears that patient has been accepted to [facility] and will likely go there today.Malvin Johns, MD 05/08/22 1329

## 2022-05-08 NOTE — ED Notes (Signed)
Pt at desk on 2nd phone call.

## 2022-05-08 NOTE — Progress Notes (Signed)
Patient has been denied by [facility] due to no appropriate beds available. Patient meets [place] inpatient criteria per Vesta Mixer, NP. Patient has been faxed out to the following facilities:    [facility]  [address], [place] [address] [phone]  [place] [place] [address] [phone]  [facility]  [address], [place] [address] [phone]  [facility]  [place], [place] [address] [phone]  [facility]  [address] [place], [place] [address] [phone] [place] [place] [address] [place]  [facility]  [address] [place] [address] [phone]  [facility]  [address] [place] [address] [phone]  [facility]  [address] [place] [address] [phone] [place], [place] [address] [phone] [facility]  [address], [place] [address] [phone]  [facility]  [address] [place] [address] [phone] [facility]  [address], [place] [address] [phone]  [facility]  [place], [place] [address] [phone]  [facility]  [address] [place] [address] [phone] [facility]  [place], [place] [address] [place]  [facility] [address] [place] [address] [phone]  [facility]   [place] [place] [address] [phone]  [facility]  [place] [place] [address] [phone]  [facility]  [address], [place] [address] [place]  [facility]  [address], [place] [address] [phone]  [facility]  [address], [place] [address] [phone]  [place] [address] [phone] [facility]  [address] [place] [address] [phone]  [facility]  [address] [place] [address] [phone] [facility]  [address], [place] [address] [place]   Mariea Clonts, MSW, [name]-A  10:02 AM 05/08/2022

## 2022-05-08 NOTE — ED Notes (Signed)
Report called to [facility] and sgt paschal called for transportation

## 2022-05-08 NOTE — ED Notes (Signed)
Pt provided with shower supplies and clean scrubs. Pt ambulatory to shower with sitter present.

## 2022-05-08 NOTE — ED Notes (Signed)
Tooth brush and totth paste provided

## 2022-05-08 NOTE — Progress Notes (Signed)
Per morning meeting on 05/07/22 [facility] is still reviewing pt and Medical Director to meet with pt's ACT team for a meeting. CSW will continue to assist and follow with placement. Pt remains on [place] waitlist with no movement.  Benjaman Kindler, MSW, LCSWA 05/08/2022 12:21 AM

## 2022-05-08 NOTE — ED Notes (Signed)
IVC paperwork complete/renewed, expires 05/14/22 

## 2022-05-08 NOTE — Progress Notes (Signed)
[facility] [name] Progress Note   05/08/2022    1:31 PM  Molly [name]   [id number]   Type of Contact and Topic:  Psychiatric Bed Placement   Pt accepted to [facility]      Patient meets inpatient criteria per Vesta Mixer, NP  The attending provider will be Flossie Buffy, MD   Call report to [phone]  Wyatt Mage, RN @ [facility] ED notified.     Pt scheduled  to arrive at [facility] ANYTIME.    Mariea Clonts, MSW, [name]-A  1:33 PM 05/08/2022

## 2022-05-08 NOTE — ED Notes (Signed)
Sgt [place]le called and is unable to take the pt. Today.  The weather is bad and he will not make it back in time to transport the AM Rn will have to call them in them morning for transportation

## 2022-05-08 NOTE — ED Notes (Signed)
Patient started coughing and vomited. Patient denies any pain at this time.  Offered to change her bed and clean the soiled floor. Patient refused.

## 2022-05-09 DIAGNOSIS — F259 Schizoaffective disorder, unspecified: Secondary | ICD-10-CM | POA: Diagnosis not present

## 2022-05-09 NOTE — ED Provider Notes (Signed)
Emergency Medicine Observation Re-evaluation Note  Molly [name] is a 37 y.o. female, seen on rounds today.  Pt initially presented to the ED for complaints of Medical Clearance Currently, the patient is sleeping.  Physical Exam  BP (!) 141/97 (BP Location: Left Wrist)   Pulse 78   Temp 98.1 F (36.7 C) (Oral)   Resp 15   Ht 5\' 8"  (1.727 m)   Wt 123.8 kg   SpO2 99%   BMI 41.50 kg/m  Physical Exam General: No acute distress Cardiac: Normal rate Lungs: No increased work of breathing Psych: Calm  ED Course / MDM  EKG:   I have reviewed the labs performed to date as well as medications administered while in observation.  Recent changes in the last 24 hours include none.  Plan  Current plan is for transfer to [facility] today.    Malvin Johns, MD 05/09/22 1128

## 2022-05-09 NOTE — ED Notes (Signed)
[employer] returned phone call and states they are on another transport at the time. Will call back for ETA to pick up pt.

## 2022-05-09 NOTE — ED Notes (Signed)
Pt awake and sitter obtained vital signs. This RN went into patient's room to attempt to administer medication. This RN asked pt if she was ready to take medicaiton pt stated "yes" softly. This RN asked pt to scan armband and pt did not respond. This RN asked pt "do you want to wait to take medication?" Pt still gave no response. Will give pt time to fully wake up and will attempt to administer medication again.

## 2022-05-09 NOTE — ED Notes (Signed)
[facility] returned phone call. Told to call back with pt's ETA when [facility] is en route to give an updated report.

## 2022-05-09 NOTE — ED Notes (Signed)
[facility] called and left page for an updated report and ETA for pt. Awaiting return phone call. Left VM for [facility] [employer] to call back for patient transportation to [facility].

## 2022-05-09 NOTE — ED Notes (Signed)
[facility] called again and number left to callback on pager. Awaiting return phone call.

## 2022-06-11 ENCOUNTER — Other Ambulatory Visit: Payer: Self-pay

## 2022-06-11 ENCOUNTER — Emergency Department (HOSPITAL_COMMUNITY): Payer: 59

## 2022-06-11 ENCOUNTER — Encounter (HOSPITAL_COMMUNITY): Payer: Self-pay

## 2022-06-11 ENCOUNTER — Emergency Department (HOSPITAL_COMMUNITY)
Admission: EM | Admit: 2022-06-11 | Discharge: 2022-06-12 | Disposition: A | Discharge status: Home / Self Care | Payer: 59 | Attending: Emergency Medicine | Admitting: Emergency Medicine

## 2022-06-11 DIAGNOSIS — R443 Hallucinations, unspecified: Secondary | ICD-10-CM | POA: Diagnosis present

## 2022-06-11 DIAGNOSIS — F209 Schizophrenia, unspecified: Secondary | ICD-10-CM

## 2022-06-11 DIAGNOSIS — M25562 Pain in left knee: Secondary | ICD-10-CM | POA: Insufficient documentation

## 2022-06-11 DIAGNOSIS — Z1152 Encounter for screening for COVID-19: Secondary | ICD-10-CM | POA: Diagnosis not present

## 2022-06-11 DIAGNOSIS — F259 Schizoaffective disorder, unspecified: Secondary | ICD-10-CM | POA: Diagnosis not present

## 2022-06-11 LAB — URINALYSIS, ROUTINE W REFLEX MICROSCOPIC
Bilirubin Urine: NEGATIVE
Glucose, UA: NEGATIVE mg/dL
Hgb urine dipstick: NEGATIVE
Ketones, ur: NEGATIVE mg/dL
Leukocytes,Ua: NEGATIVE
Nitrite: NEGATIVE
Protein, ur: NEGATIVE mg/dL
Specific Gravity, Urine: 1.014 (ref 1.005–1.030)
pH: 7 (ref 5.0–8.0)

## 2022-06-11 LAB — RAPID URINE DRUG SCREEN, HOSP PERFORMED
Amphetamines: NOT DETECTED
Barbiturates: NOT DETECTED
Benzodiazepines: NOT DETECTED
Cocaine: NOT DETECTED
Opiates: NOT DETECTED
Tetrahydrocannabinol: NOT DETECTED

## 2022-06-11 LAB — COMPREHENSIVE METABOLIC PANEL
ALT: 14 U/L (ref 0–44)
AST: 20 U/L (ref 15–41)
Albumin: 3.8 g/dL (ref 3.5–5.0)
Alkaline Phosphatase: 100 U/L (ref 38–126)
Anion gap: 9 (ref 5–15)
BUN: 8 mg/dL (ref 6–20)
CO2: 23 mmol/L (ref 22–32)
Calcium: 9 mg/dL (ref 8.9–10.3)
Chloride: 105 mmol/L (ref 98–111)
Creatinine, Ser: 0.67 mg/dL (ref 0.44–1.00)
GFR, Estimated: >60 mL/min (ref >60)
Glucose, Bld: 99 mg/dL (ref 70–99)
Potassium: 3.8 mmol/L (ref 3.5–5.1)
Sodium: 137 mmol/L (ref 135–145)
Total Bilirubin: 0.3 mg/dL (ref 0.3–1.2)
Total Protein: 7.7 g/dL (ref 6.5–8.1)

## 2022-06-11 LAB — CBC WITH DIFFERENTIAL/PLATELET
Abs Immature Granulocytes: 0.06 10*3/uL (ref 0.00–0.07)
Basophils Absolute: 0.1 10*3/uL (ref 0.0–0.1)
Basophils Relative: 0 %
Eosinophils Absolute: 0.2 10*3/uL (ref 0.0–0.5)
Eosinophils Relative: 1 %
HCT: 37.8 % (ref 36.0–46.0)
Hemoglobin: 12.1 g/dL (ref 12.0–15.0)
Immature Granulocytes: 0 %
Lymphocytes Relative: 21 %
Lymphs Abs: 2.8 10*3/uL (ref 0.7–4.0)
MCH: 29.1 pg (ref 26.0–34.0)
MCHC: 32 g/dL (ref 30.0–36.0)
MCV: 90.9 fL (ref 80.0–100.0)
Monocytes Absolute: 1.1 10*3/uL — ABNORMAL HIGH (ref 0.1–1.0)
Monocytes Relative: 8 %
Neutro Abs: 9.5 10*3/uL — ABNORMAL HIGH (ref 1.7–7.7)
Neutrophils Relative %: 70 %
Platelets: 467 10*3/uL — ABNORMAL HIGH (ref 150–400)
RBC: 4.16 MIL/uL (ref 3.87–5.11)
RDW: 15.2 % (ref 11.5–15.5)
WBC: 13.7 10*3/uL — ABNORMAL HIGH (ref 4.0–10.5)
nRBC: 0 % (ref 0.0–0.2)

## 2022-06-11 LAB — I-STAT BETA HCG BLOOD, ED (MC, WL, AP ONLY): I-stat hCG, quantitative: <5 m[IU]/mL (ref <5)

## 2022-06-11 LAB — ETHANOL: Alcohol, Ethyl (B): <10 mg/dL (ref <10)

## 2022-06-11 LAB — ACETAMINOPHEN LEVEL: Acetaminophen (Tylenol), Serum: <10 ug/mL — ABNORMAL LOW (ref 10–30)

## 2022-06-11 LAB — AMMONIA: Ammonia: 31 umol/L (ref 9–35)

## 2022-06-11 LAB — SALICYLATE LEVEL: Salicylate Lvl: <7 mg/dL — ABNORMAL LOW (ref 7.0–30.0)

## 2022-06-11 LAB — VALPROIC ACID LEVEL: Valproic Acid Lvl: 38 ug/mL — ABNORMAL LOW (ref 50.0–100.0)

## 2022-06-11 MED ORDER — BENZTROPINE MESYLATE 1 MG PO TABS
1.0000 mg | ORAL_TABLET | Freq: Two times a day (BID) | ORAL | Status: DC
  Administered 2022-06-11 – 2022-06-12 (×3): 1 mg via ORAL
  Filled 2022-06-11: qty 1
  Filled 2022-06-11: qty 2
  Filled 2022-06-11: qty 1

## 2022-06-11 MED ORDER — OLANZAPINE 10 MG PO TABS
10.0000 mg | ORAL_TABLET | Freq: Every day | ORAL | Status: DC
  Administered 2022-06-11 – 2022-06-12 (×2): 10 mg via ORAL
  Filled 2022-06-11 (×2): qty 1

## 2022-06-11 MED ORDER — LISINOPRIL 10 MG PO TABS
5.0000 mg | ORAL_TABLET | Freq: Every day | ORAL | Status: DC
  Administered 2022-06-11 – 2022-06-12 (×2): 5 mg via ORAL
  Filled 2022-06-11 (×2): qty 1

## 2022-06-11 MED ORDER — DIVALPROEX SODIUM 500 MG PO DR TAB
1500.0000 mg | DELAYED_RELEASE_TABLET | Freq: Every evening | ORAL | Status: DC
  Administered 2022-06-11: 1500 mg via ORAL
  Filled 2022-06-11: qty 3

## 2022-06-11 MED ORDER — ZIPRASIDONE MESYLATE 20 MG IM SOLR
20.0000 mg | Freq: Once | INTRAMUSCULAR | Status: AC
  Administered 2022-06-11: 20 mg via INTRAMUSCULAR
  Filled 2022-06-11: qty 20

## 2022-06-11 MED ORDER — DIVALPROEX SODIUM 500 MG PO DR TAB
1000.0000 mg | DELAYED_RELEASE_TABLET | Freq: Every morning | ORAL | Status: DC
  Administered 2022-06-12: 1000 mg via ORAL
  Filled 2022-06-11: qty 2

## 2022-06-11 MED ORDER — STERILE WATER FOR INJECTION IJ SOLN
INTRAMUSCULAR | Status: AC
  Filled 2022-06-11: qty 10

## 2022-06-11 MED ORDER — HALOPERIDOL 5 MG PO TABS
10.0000 mg | ORAL_TABLET | Freq: Two times a day (BID) | ORAL | Status: DC
  Administered 2022-06-11 – 2022-06-12 (×3): 10 mg via ORAL
  Filled 2022-06-11 (×3): qty 2
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
## 2022-06-11 NOTE — ED Notes (Signed)
Sane nurse at bedside

## 2022-06-11 NOTE — SANE Note (Signed)
At approximately 3 PM, the SANE/FNE Naval architect) consult has been completed. The ED Provider, Dr Vevelyn Francois, is aware.   Please contact the SANE/FNE nurse on call (listed in [place]) with any further concerns.
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
## 2022-06-11 NOTE — ED Notes (Signed)
Pt refused swabs, MD aware

## 2022-06-11 NOTE — ED Notes (Signed)
Patient transferred from ER room to TCU. Patient being loud. Patient has urinated in clothes, but refuses to shower or change clothes. Asking to eat something, tech getting her food to eat at this time.
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
## 2022-06-11 NOTE — ED Notes (Signed)
Dr. Rancour at bedside. 
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
## 2022-06-11 NOTE — ED Notes (Signed)
Pt belongings placed in 3 bags with pt label. 1 bag placed in TCU. Other 2 bags placed in pt belongings cabinet 16-18.

## 2022-06-11 NOTE — ED Notes (Signed)
Sitter at bedside 

## 2022-06-11 NOTE — ED Notes (Signed)
X-Ray at bedside.

## 2022-06-12 DIAGNOSIS — F209 Schizophrenia, unspecified: Secondary | ICD-10-CM

## 2022-06-12 DIAGNOSIS — F259 Schizoaffective disorder, unspecified: Secondary | ICD-10-CM | POA: Diagnosis not present

## 2022-06-12 LAB — WET PREP, GENITAL
Sperm: NONE SEEN
Trich, Wet Prep: NONE SEEN
WBC, Wet Prep HPF POC: >=10 — AB (ref <10)
Yeast Wet Prep HPF POC: NONE SEEN

## 2022-06-12 LAB — SARS CORONAVIRUS 2 BY RT PCR: SARS Coronavirus 2 by RT PCR: NEGATIVE

## 2022-06-12 MED ORDER — LORAZEPAM 2 MG/ML IJ SOLN
2.0000 mg | Freq: Once | INTRAMUSCULAR | Status: DC
  Filled 2022-06-12: qty 1

## 2022-06-12 MED ORDER — NICOTINE 14 MG/24HR TD PT24
14.0000 mg | MEDICATED_PATCH | Freq: Once | TRANSDERMAL | Status: DC
  Administered 2022-06-12: 14 mg via TRANSDERMAL
  Filled 2022-06-12: qty 1

## 2022-06-12 NOTE — Discharge Summary (Cosign Needed Addendum)
[facility] Psych ED Discharge  06/12/2022 2:25 PM Molly [name]  MRN:  [id number]  Principal Problem: Schizoaffective disorder [facility]) Discharge Diagnoses: Principal Problem:   Schizoaffective disorder [facility])  Clinical Impression:  Final diagnoses:  Schizophrenia, unspecified type [facility])    ED Assessment Time Calculation: Start Time: [id number] Stop Time: 1150 Total Time in Minutes (Assessment Completion): 15   Subjective: Molly [name], 37 y.o., female patient seen face to face by this provider, consulted with Dr. Dwyane Dee; and chart reviewed on 06/12/22. During evaluation Molly [name] is laying in hospital bed in no acute distress.  She is awake and alert, oriented x 3.  Provider asked her how she was doing, patient stated "I want to go home." Patient denies SI/HI/AVH, says she has been compliant with her medications here while in the hospital.  Patient says that she has had a good appetite and slept good while she was here, patient was very cooperative and polite, said yes ma'am.  Patient's mood is euthymic with congruent affect.  She has normal speech, normal volume, and appropriate behavior. Objectively there is no evidence of psychosis/mania or delusional thinking.  Patient is able to converse coherently, no distractibility, or pre-occupation.  She denies suicidal/self-harm/homicidal ideation, psychosis, and paranoia.   Provider spoke with patient's nurse, nurse stated that patient was a little agitated this morning, but able to calm down without any any medication.  Spoke with Molly [name], on patient [name], Ms. Molly [name] said that she saw patient yesterday, and will follow-up with patient tomorrow June 13, 2022.  Ms. Molly [name] is patient's health specialist and QP.  Spoke with patient's legal guardian Molly [name] at [phone] and she stated that she was out of town, was not able to pick patient up and was able to help with transportation and getting her to [facility], with a bus pass or cab  voucher.  Past Psychiatric History: See H&P  Past Medical History:  Past Medical History:  Diagnosis Date   Abnormal Pap smear    ASC-cannot exclude HGSIL on Pap 02/15/2012   ASC-US on 02/03/2012 pap (associated Trichomonas infection). No reflex HPV testing performed on specimen.  Patient informed that she will need repeat Pap in one year.       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 Henrene Dodge (Psych) At [facility] in  [place], [place]. 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   Qualifier: Diagnosis of  By: McDiarmid MD, Jolyn Nap USER 02/08/2009   Qualifier: Diagnosis of  By: Samara Snide      Past Surgical History:  Procedure Laterality Date   INCISION AND DRAINAGE     pilanodal cyst   TIBIA IM NAIL INSERTION Right 10/26/2021   Procedure: INTRAMEDULLARY (IM) NAIL TIBIAL;  Surgeon: Altamese Cashion [place], MD;  Location: [place];  Service: Orthopedics;  Laterality: Right;  TOOTH EXTRACTION N/A 10/07/2017   Procedure: EXTRACTION TEETH NUMBERS ONE, SEVENTEEN, NINETEEN AND THIRTY TWO;  Surgeon: Diona Browner, DDS;  Location: [place];  Service: Oral Surgery;  Laterality: N/A;   Family History:  Family History  Adopted: Yes  Problem Relation Age of Onset   Bipolar disorder Sister    Alcohol abuse Brother    Cancer Father    Diabetes Mother     Social History:  Social History   Substance and Sexual Activity  Alcohol Use No   Comment: occ      Social History   Substance and Sexual Activity  Drug Use No    Social History   Socioeconomic History   Marital status: Single    Spouse name: Not on file   Number of children: Not on file   Years of education: Not on file   Highest education level: Not on file  Occupational History   Not on file  Tobacco Use   Smoking status: Every Day    Packs/day: 2.00    Years: 10.00    Total pack years: 20.00    Types: Cigarettes   Smokeless tobacco: Never  Vaping Use   Vaping Use: Never used  Substance and Sexual Activity   Alcohol use: No    Comment: occ   Drug use: No   Sexual activity: Yes    Birth control/protection: Injection  Other Topics Concern   Not on file  Social History Narrative   Adopted   Has Guardian   Living in [place] home with Molly [name]   Transportation: Bus   Social Determinants of Health   Financial Resource Strain: Not on file  Food Insecurity: Not on file  Transportation Needs: Not on file  Physical Activity: Not on file  Stress: Not on file  Social Connections: Not on file    Tobacco Cessation:  A prescription for an FDA-approved tobacco cessation medication was offered at discharge and the patient refused  Current Medications: Current Facility-Administered Medications  Medication Dose Route Frequency Provider Last Rate Last Admin   benztropine (COGENTIN) tablet 1 mg  1 mg Oral BID Rancour, Annie Main, MD   1 mg at 06/12/22 0942   divalproex (DEPAKOTE) DR tablet 1,000 mg  1,000 mg Oral q morning Rancour, Stephen, MD   1,000 mg at 06/12/22 [id number]   divalproex (DEPAKOTE) DR tablet 1,500 mg  1,500 mg Oral QPM Rancour, Stephen, MD   1,500 mg at 06/11/22 1819   haloperidol (HALDOL) tablet 10 mg  10 mg Oral BID Rancour, Annie Main, MD   10 mg at 06/12/22 0942   lisinopril (ZESTRIL) tablet 5 mg  5 mg Oral Daily Rancour, Stephen, MD   5 mg at 06/12/22 0942   LORazepam (ATIVAN) injection 2 mg  2 mg Intramuscular Once Valarie Merino, MD       nicotine  (NICODERM CQ - dosed in mg/24 hours) patch 14 mg  14 mg Transdermal Once Valarie Merino, MD   14 mg at 06/12/22 1054   OLANZapine (ZYPREXA) tablet 10 mg  10 mg Oral Daily Rancour, Stephen, MD   10 mg at 06/12/22 0941   Current Outpatient Medications  Medication Sig Dispense Refill   divalproex (DEPAKOTE) 500 MG DR tablet Take 1 tablet (500 mg total) by mouth 2 (two) times daily for 7 days. 14 tablet 0   fluPHENAZine (PROLIXIN) 10 MG tablet Take 10 mg by mouth at bedtime.     fluPHENAZine decanoate (PROLIXIN) 25 MG/ML injection Inject 25  mg into the muscle every 21 ( twenty-one) days. Due on 06-08-22     lisinopril (ZESTRIL) 5 MG tablet Take 5 mg by mouth daily.     lithium carbonate 300 MG capsule Take 300 mg by mouth 2 (two) times daily.     albuterol (VENTOLIN HFA) 108 (90 Base) MCG/ACT inhaler Inhale 1 puff into the lungs every 4 (four) hours as needed for wheezing or shortness of breath.     benztropine (COGENTIN) 1 MG tablet Take 1 mg by mouth 2 (two) times daily. (Patient not taking: Reported on 06/12/2022)     haloperidol (HALDOL) 10 MG tablet Take 1 tablet (10 mg total) by mouth 2 (two) times daily. (Patient not taking: Reported on 06/12/2022) 60 tablet 0   OLANZapine (ZYPREXA) 10 MG tablet Take 1 tablet (10 mg total) by mouth daily for 7 days. (Patient not taking: Reported on 06/12/2022) 7 tablet 0   paliperidone (INVEGA SUSTENNA) 156 MG/ML SUSY injection Inject 156 mg into the muscle once. (Patient not taking: Reported on 04/13/2022)     PTA Medications: (Not in a hospital admission)   [place]:  [place] ED from 06/11/2022 in [facility] Emergency Department at [facility] ED from 04/10/2022 in [facility] Emergency Department at [facility] ED from 04/08/2022 in [facility] Emergency Department at [place] No Risk No Risk No Risk       Musculoskeletal: Strength & Muscle Tone: within normal limits Gait & Station:  normal Patient leans: N/A  Psychiatric Specialty Exam: Presentation  General Appearance:  Appropriate for Environment  Eye Contact: Fleeting  Speech: Clear and Coherent  Speech Volume: Normal  Handedness: Right   Mood and Affect  Mood: Euthymic  Affect: Appropriate   Thought Process  Thought Processes: Coherent  Descriptions of Associations:Intact  Orientation:Full (Time, Place and Person)  Thought Content:WDL  History of Schizophrenia/Schizoaffective disorder:Yes  Duration of Psychotic Symptoms:Greater than six months  Hallucinations:Hallucinations: None  Ideas of Reference:None  Suicidal Thoughts:Suicidal Thoughts: No  Homicidal Thoughts:Homicidal Thoughts: No   Sensorium  Memory: Immediate Fair; Remote Fair  Judgment: Fair  Insight: Fair   Executive Functions  Concentration: Good  Attention Span: Good  Recall: [place] of Knowledge: Fair  Language: Good   Psychomotor Activity  Psychomotor Activity: Psychomotor Activity: Normal   Assets  Assets: Communication Skills; Social Support   Sleep  Sleep: Sleep: Good    Physical Exam: Physical Exam Eyes:     Pupils: Pupils are equal, round, and reactive to light.  Cardiovascular:     Rate and Rhythm: Normal rate.  Abdominal:     General: Abdomen is flat.  Neurological:     Mental Status: She is alert.  Psychiatric:        Mood and Affect: Mood normal.        Behavior: Behavior normal.        Thought Content: Thought content normal.        Judgment: Judgment normal.    Review of Systems  HENT: Negative.    Respiratory: Negative.    Musculoskeletal: Negative.   Skin: Negative.   Psychiatric/Behavioral: Negative.     Blood pressure 107/70, pulse (!) 107, temperature 99.5 F (37.5 C), temperature source Oral, resp. rate 20, height 5' 8"$  (1.727 m), weight 124 kg, SpO2 99 %, unknown if currently breastfeeding. Body mass index is 41.57  kg/m.   Demographic Factors:  Low socioeconomic status and Unemployed  Loss Factors:  Financial problems/change in socioeconomic status  Historical Factors: Impulsivity  Risk Reduction Factors:   Positive social support  Continued Clinical Symptoms:  More than one psychiatric diagnosis  Cognitive Features That Contribute To Risk:  None    Suicide Risk:  Minimal: No identifiable suicidal ideation.  Patients presenting with no risk factors but with morbid ruminations; may be classified as minimal risk based on the severity of the depressive symptoms   Follow-up Information     [facility] Follow up.   Why: If symptoms worsen, As needed Contact information: [place] [address] [phone]         [facility] Follow up.   Why: If symptoms worsen, As needed Contact information: [address] [place] [address] [phone]         [name] Follow up.   Specialty: Urgent Care Why: If symptoms worsen, As needed Contact information: [place] [phone]                 Medical Decision Making: Patient is psychiatrically cleared.  Spoke with patient's legal guardian Sallye Lat informed her of decision. Will put a consult in for TOC to help with transportation. At time of discharge, patient denies SI, HI, AVH and is able to contract for safety. She demonstrated no overt evidence of psychosis or mania. Prior to discharge, she verbalized that they understood warning signs, triggers, and symptoms of worsening mental health and how to access emergency mental health care if they felt it was needed. Patient was instructed to call 911 or return to the emergency room if they experienced any concerning symptoms after discharge. Patient voiced understanding and agreed to the above. Patient's nurse is helping patient, so that  she can ride with safety transport to [facility].  Disposition: Patient does not meet criteria for psychiatric inpatient admission. Supportive therapy provided about ongoing stressors. Discussed crisis plan, support from social network, calling 911, coming to the Emergency Department, and calling Suicide Hotline.   Rhaelyn Giron MOTLEY-MANGRUM, PMHNP 06/12/2022, 2:25 PM

## 2022-06-12 NOTE — ED Notes (Signed)
When covid swab was attempted to be gathered pt called this RN a "stupid bitch". Pt relented and swabbed their own nose for test.

## 2022-06-12 NOTE — ED Notes (Signed)
Pt screaming and hitting head against wall. Pt calmed when given a warm blanket. Sedation ordered in case agitation increases again. Pt asked for nicotine, nicotine patch ordered

## 2022-06-12 NOTE — ED Notes (Signed)
Cab voucher made, approved by [place]. [employer] called

## 2022-06-12 NOTE — Progress Notes (Signed)
Spoke with Ms. Molly [name] [phone] today at 1326 and she stated that she is ok with patient receiving a bus pass or cab voucher to [place] [facility]

## 2022-06-12 NOTE — Progress Notes (Signed)
TOC CSW received consult regarding transportation assistance. CSW spoke with pt's LG Sallye Lat and received permission for pt to d/c to [facility] with taxi voucher.  No additional needs TOC sign off.   Arlie Solomons.Tristyn Pharris, MSW, [place]  Transitions of Care Clinical Social Worker I Direct Dial: [phone]  Fax: [phone] [email or web address]

## 2022-06-12 NOTE — Discharge Instructions (Signed)

## 2022-06-12 NOTE — ED Provider Notes (Signed)
Emergency Medicine Observation Re-evaluation Note  Molly [name] is a 37 y.o. female, seen on rounds today at 0700.  Pt initially presented to the ED for complaints of Psychiatric Evaluation Currently, the patient is resting comfortably.  Physical Exam  BP 107/70 (BP Location: Left Arm)   Pulse (!) 107   Temp 99.5 F (37.5 C) (Oral)   Resp 20   Ht 5' 8"$  (1.727 m)   Wt 124 kg   SpO2 99%   BMI 41.57 kg/m  Physical Exam General: NAD   ED Course / MDM  EKG:   I have reviewed the labs performed to date as well as medications administered while in observation.  Recent changes in the last 24 hours include no acute events reported.  Plan  Current plan is for continued observation.    Valarie Merino, MD 06/12/22 [phone]

## 2022-06-12 NOTE — ED Notes (Signed)
Pt left with [employer]

## 2022-06-14 LAB — GC/CHLAMYDIA PROBE AMP (~~LOC~~) NOT AT ARMC
Chlamydia: NEGATIVE
Comment: NEGATIVE
Comment: NORMAL
Neisseria Gonorrhea: NEGATIVE

## 2022-07-04 ENCOUNTER — Encounter (HOSPITAL_COMMUNITY): Payer: Self-pay

## 2022-07-04 ENCOUNTER — Emergency Department (HOSPITAL_COMMUNITY): Payer: 59

## 2022-07-04 ENCOUNTER — Emergency Department (HOSPITAL_COMMUNITY)
Admission: EM | Admit: 2022-07-04 | Discharge: 2022-07-06 | Disposition: A | Discharge status: Home / Self Care | Payer: 59 | Attending: Emergency Medicine | Admitting: Emergency Medicine

## 2022-07-04 DIAGNOSIS — F29 Unspecified psychosis not due to a substance or known physiological condition: Secondary | ICD-10-CM | POA: Diagnosis not present

## 2022-07-04 DIAGNOSIS — Z79899 Other long term (current) drug therapy: Secondary | ICD-10-CM | POA: Diagnosis not present

## 2022-07-04 DIAGNOSIS — R112 Nausea with vomiting, unspecified: Secondary | ICD-10-CM | POA: Insufficient documentation

## 2022-07-04 DIAGNOSIS — F99 Mental disorder, not otherwise specified: Secondary | ICD-10-CM | POA: Diagnosis present

## 2022-07-04 DIAGNOSIS — Z20822 Contact with and (suspected) exposure to covid-19: Secondary | ICD-10-CM | POA: Insufficient documentation

## 2022-07-04 DIAGNOSIS — J45909 Unspecified asthma, uncomplicated: Secondary | ICD-10-CM | POA: Diagnosis not present

## 2022-07-04 DIAGNOSIS — E119 Type 2 diabetes mellitus without complications: Secondary | ICD-10-CM | POA: Insufficient documentation

## 2022-07-04 DIAGNOSIS — I10 Essential (primary) hypertension: Secondary | ICD-10-CM | POA: Diagnosis not present

## 2022-07-04 DIAGNOSIS — F209 Schizophrenia, unspecified: Secondary | ICD-10-CM | POA: Diagnosis not present

## 2022-07-04 LAB — COMPREHENSIVE METABOLIC PANEL
ALT: 13 U/L (ref 0–44)
AST: 16 U/L (ref 15–41)
Albumin: 3.6 g/dL (ref 3.5–5.0)
Alkaline Phosphatase: 93 U/L (ref 38–126)
Anion gap: 9 (ref 5–15)
BUN: 10 mg/dL (ref 6–20)
CO2: 21 mmol/L — ABNORMAL LOW (ref 22–32)
Calcium: 8.9 mg/dL (ref 8.9–10.3)
Chloride: 107 mmol/L (ref 98–111)
Creatinine, Ser: 0.82 mg/dL (ref 0.44–1.00)
GFR, Estimated: >60 mL/min (ref >60)
Glucose, Bld: 89 mg/dL (ref 70–99)
Potassium: 4 mmol/L (ref 3.5–5.1)
Sodium: 137 mmol/L (ref 135–145)
Total Bilirubin: 0.4 mg/dL (ref 0.3–1.2)
Total Protein: 7.3 g/dL (ref 6.5–8.1)

## 2022-07-04 LAB — VALPROIC ACID LEVEL: Valproic Acid Lvl: 18 ug/mL — ABNORMAL LOW (ref 50.0–100.0)

## 2022-07-04 LAB — BLOOD GAS, VENOUS
Acid-Base Excess: 0.7 mmol/L (ref 0.0–2.0)
Bicarbonate: 25.4 mmol/L (ref 20.0–28.0)
O2 Saturation: 96.2 %
Patient temperature: 37
pCO2, Ven: 40 mmHg — ABNORMAL LOW (ref 44–60)
pH, Ven: 7.41 (ref 7.25–7.43)
pO2, Ven: 77 mmHg — ABNORMAL HIGH (ref 32–45)

## 2022-07-04 LAB — LITHIUM LEVEL: Lithium Lvl: 0.18 mmol/L — ABNORMAL LOW (ref 0.60–1.20)

## 2022-07-04 LAB — I-STAT BETA HCG BLOOD, ED (MC, WL, AP ONLY): I-stat hCG, quantitative: <5 m[IU]/mL (ref <5)

## 2022-07-04 LAB — ACETAMINOPHEN LEVEL: Acetaminophen (Tylenol), Serum: <10 ug/mL — ABNORMAL LOW (ref 10–30)

## 2022-07-04 LAB — CBC
HCT: 34.7 % — ABNORMAL LOW (ref 36.0–46.0)
Hemoglobin: 11.3 g/dL — ABNORMAL LOW (ref 12.0–15.0)
MCH: 29.3 pg (ref 26.0–34.0)
MCHC: 32.6 g/dL (ref 30.0–36.0)
MCV: 89.9 fL (ref 80.0–100.0)
Platelets: 460 10*3/uL — ABNORMAL HIGH (ref 150–400)
RBC: 3.86 MIL/uL — ABNORMAL LOW (ref 3.87–5.11)
RDW: 14.6 % (ref 11.5–15.5)
WBC: 13.5 10*3/uL — ABNORMAL HIGH (ref 4.0–10.5)
nRBC: 0 % (ref 0.0–0.2)

## 2022-07-04 LAB — RESP PANEL BY RT-PCR (RSV, FLU A&B, COVID)  RVPGX2
Influenza A by PCR: NEGATIVE
Influenza B by PCR: NEGATIVE
Resp Syncytial Virus by PCR: NEGATIVE
SARS Coronavirus 2 by RT PCR: NEGATIVE

## 2022-07-04 LAB — ETHANOL: Alcohol, Ethyl (B): <10 mg/dL (ref <10)

## 2022-07-04 LAB — SALICYLATE LEVEL: Salicylate Lvl: <7 mg/dL — ABNORMAL LOW (ref 7.0–30.0)

## 2022-07-04 MED ORDER — LORAZEPAM 2 MG/ML IJ SOLN
2.0000 mg | Freq: Once | INTRAMUSCULAR | Status: AC
  Administered 2022-07-04: 2 mg via INTRAMUSCULAR
  Filled 2022-07-04: qty 1

## 2022-07-04 MED ORDER — DIPHENHYDRAMINE HCL 50 MG/ML IJ SOLN
25.0000 mg | Freq: Once | INTRAMUSCULAR | Status: DC
  Filled 2022-07-04: qty 1

## 2022-07-04 MED ORDER — DIVALPROEX SODIUM ER 500 MG PO TB24
750.0000 mg | ORAL_TABLET | Freq: Every day | ORAL | Status: DC
  Administered 2022-07-04 – 2022-07-06 (×3): 750 mg via ORAL
  Filled 2022-07-04 (×3): qty 1

## 2022-07-04 MED ORDER — ZIPRASIDONE MESYLATE 20 MG IM SOLR
20.0000 mg | Freq: Once | INTRAMUSCULAR | Status: AC
  Administered 2022-07-04: 20 mg via INTRAMUSCULAR
  Filled 2022-07-04: qty 20

## 2022-07-04 MED ORDER — STERILE WATER FOR INJECTION IJ SOLN
INTRAMUSCULAR | Status: AC
  Filled 2022-07-04: qty 10

## 2022-07-04 MED ORDER — DIPHENHYDRAMINE HCL 50 MG/ML IJ SOLN
25.0000 mg | Freq: Once | INTRAMUSCULAR | Status: AC
  Administered 2022-07-04: 25 mg via INTRAMUSCULAR
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
## 2022-07-04 NOTE — ED Notes (Signed)
Pt belongings collected and placed in labeled bag, including several scarfs, shirt, pants, shoes, jewelry (beads), 2 small purses.  Bags placed in Nurses station Rms 16-18. [employer]

## 2022-07-04 NOTE — ED Notes (Signed)
Pt not cooperating with blood draw. Pt at first would not acknowledge writer at all. Writer gently spoke to pt and explained to her why a blood draw was needed. Pt still refusing blood draw.

## 2022-07-04 NOTE — ED Notes (Signed)
Pt eloped out ambulance bay, security notified. EDP notified.

## 2022-07-04 NOTE — ED Notes (Signed)
Resting in bed. Psych NP Josephine at [facility]. Pt interactive, calm at this time. Asking to go now. Unable to leave explained. Sitter, security and [employer] present.

## 2022-07-04 NOTE — ED Notes (Signed)
Pt now abruptly and spontaneously requesting her EKG

## 2022-07-04 NOTE — ED Notes (Addendum)
Pt to b/r, steady rapid gait, changed into purple scrubs, then back out to stretcher, talking loudly to self. Triage completed in EPIC despite pt being uncooperative.

## 2022-07-04 NOTE — ED Notes (Signed)
Pt refused vitals reassessment after x2 attempts. Will attempt to reassess within 1hr approx. [employer]

## 2022-07-04 NOTE — ED Provider Notes (Addendum)
Molly AT [facility] Provider Note   CSN: [id number] Arrival date & time: 07/04/22  1127     History  Chief Complaint  Patient presents with   Emesis   Psychiatric Evaluation    Molly [name] is a 37 y.o. female.   Emesis Patient presents with psychiatric issues.  Reportedly has had some nausea and vomiting.  Reported has not been taking her psychiatric medicines.  States she needs a refill.  States she however was put on the wrong medicines.  States that it does make her sleepy.  Really cannot provide much history.  Did say that she drank some nail polish remover and then later walked away states she did not drink nail polish remover.    Past Medical History:  Diagnosis Date   Abnormal Pap smear    ASC-cannot exclude HGSIL on Pap 02/15/2012   ASC-US on 02/03/2012 pap (associated Trichomonas infection). No reflex HPV testing performed on specimen.  Patient informed that she will need repeat Pap in one year.       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 Henrene Dodge (Psych) At [facility] in  [place], [place]. 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 Authorizing Provider  albuterol  (VENTOLIN HFA) 108 (90 Base) MCG/ACT inhaler Inhale 1 puff into the lungs every 4 (four) hours as needed for wheezing or shortness of breath.    [provider]  benztropine (COGENTIN) 1 MG tablet Take 1 mg by mouth 2 (two) times daily. Patient not taking: Reported on 06/12/2022 03/02/22   [provider]  divalproex (DEPAKOTE) 500 MG DR tablet Take 1 tablet (500 mg total) by mouth 2 (two) times daily for 7 days. 04/08/22 06/12/22  Marcello Fennel, PA-C  fluPHENAZine (PROLIXIN) 10 MG tablet Take 10 mg by mouth at bedtime. 05/25/22   [provider]  fluPHENAZine decanoate (PROLIXIN) 25 MG/ML injection Inject 25 mg into the muscle every 21 ( twenty-one) days. Due on 06-08-22    [provider]  haloperidol (HALDOL) 10 MG tablet Take 1 tablet (10 mg total) by mouth 2 (two) times daily. Patient not taking: Reported on 06/12/2022 01/29/22   Lindon Romp A, NP  lisinopril (ZESTRIL) 5 MG tablet Take 5 mg by mouth daily. 03/31/22   [provider]  lithium carbonate 300 MG capsule Take 300 mg by mouth 2 (two) times daily. 05/25/22   [provider]  OLANZapine (ZYPREXA) 10 MG tablet Take 1 tablet (10 mg total) by mouth daily for 7 days. Patient not taking: Reported on 06/12/2022 04/08/22 06/12/22  Marcello Fennel, PA-C  paliperidone (INVEGA SUSTENNA) 156 MG/ML SUSY injection Inject 156 mg into the muscle once. Patient not taking: Reported on 04/13/2022    [provider]      Allergies    Abilify [aripiprazole]    Review of Systems   Review of Systems  Gastrointestinal:  Positive for vomiting.    Physical Exam Updated Vital Signs BP (!) 149/80 (BP Location: Right Arm)   Pulse (!) 112   Temp 99.2 F (37.3 C) (Oral)   Resp (!) 22   SpO2 98%  Physical Exam Vitals and nursing note reviewed.  Eyes:     Pupils: Pupils are equal, round, and reactive to light.  Cardiovascular:     Rate and Rhythm: Regular rhythm.  Pulmonary:      Breath sounds: No wheezing or rhonchi.  Abdominal:     Tenderness: There is no abdominal tenderness.  Neurological:     Mental Status: She is alert.  Psychiatric:     Comments: Does not provide much history.     ED Results / Procedures / Treatments   Labs (all labs ordered are listed, but only abnormal results are displayed) Labs Reviewed  COMPREHENSIVE METABOLIC PANEL - Abnormal; Notable for the following components:      Result Value   CO2 21 (*)    All other components within normal limits  CBC - Abnormal; Notable for the following components:   WBC 13.5 (*)    RBC 3.86 (*)    Hemoglobin 11.3 (*)    HCT 34.7 (*)    Platelets 460 (*)    All other components within normal limits  VALPROIC ACID LEVEL - Abnormal; Notable for the following components:   Valproic Acid Lvl 18 (*)    All other components within normal limits  LITHIUM LEVEL - Abnormal; Notable for the following components:   Lithium Lvl 0.18 (*)    All other components within normal limits  ACETAMINOPHEN LEVEL - Abnormal; Notable for the following components:   Acetaminophen (Tylenol), Serum <10 (*)    All other components within normal limits  SALICYLATE LEVEL - Abnormal; Notable for the following components:   Salicylate Lvl [id number] (*)    All other components within normal limits  BLOOD GAS, VENOUS - Abnormal; Notable for the following components:   pCO2, Ven 40 (*)    pO2, Ven 77 (*)    All other components within normal limits  RESP PANEL BY RT-PCR (RSV, FLU A&B, COVID)  [id number]  ETHANOL  RAPID URINE DRUG SCREEN, HOSP PERFORMED  I-STAT BETA HCG BLOOD, ED ([facility], [facility], [facility] ONLY)    EKG None  Radiology DG Chest Portable 1 View  Result Date: 07/04/2022 CLINICAL DATA:  Patient had a psychiatric medications. Vomiting. Shortness of breath. EXAM: PORTABLE CHEST 1 VIEW COMPARISON:  Chest x-ray April 02, 2022 FINDINGS: The study is limited due to poor penetration. No pneumothorax. The heart, hila, mediastinum,  lungs, and pleura are unremarkable. IMPRESSION: No active disease. Electronically Signed   By: Dorise Bullion III M.D.   On: 07/04/2022 15:11    Procedures Procedures    Medications Ordered in ED Medications  divalproex (DEPAKOTE ER) 24 hr tablet 750 mg (750 mg Oral Given 07/04/22 1512)  ziprasidone (GEODON) injection 20 mg (20 mg Intramuscular Given by Other 07/04/22 1623)  sterile water (preservative free) injection (  Given 07/04/22 1623)  LORazepam (ATIVAN) injection 2 mg (2 mg Intramuscular Given 07/04/22 2117)  diphenhydrAMINE (BENADRYL) injection 25 mg (  25 mg Intramuscular Given 07/04/22 2121)    ED Course/ Medical Decision Making/ A&P                             Medical Decision Making Amount and/or Complexity of Data Reviewed Labs: ordered. Radiology: ordered.  Risk Prescription drug management.   Patient presents for psychiatric issues.  Sounds as if she has not been taking her medicines for the last few days.  Cannot reliably get history about suicidal or homicidal thoughts.  Did make a state that she drank some nail polish remover but then stated that she did not.  Initial vital signs not necessarily accurate since she was struggling against them.  Will repeat. Will get basic blood work.  Will get EKG.  Will get basic blood work to help evaluate for potential ingestions.  Lab work reassuring.  Doubt actual ingestion.  However did have mild hypoxia.  Patient is requesting nicotine gum so may have a component of smoking.  However patient has been yelling and somewhat belligerent.  With the statement of overdose I do not think she is safe for discharge at time.  Will patient be seen by TTS.  Patient is medically cleared.  I reviewed recent medications and started her back on her Depakote.  Patient has run out of the ER.  Will be returned.  Geodon will be given for chemical restraints and restraint orders placed for wrist restraints.  Patient is medically cleared.    Lithium and  Depakote levels both low but still has some in her system.  Psychiatry is tried to evaluate patient but unable to do at this time.     Final Clinical Impression(s) / ED Diagnoses Final diagnoses:  Schizophrenia, unspecified type [facility])    Rx / DC Orders ED Discharge Orders     None         Davonna Belling, MD 07/04/22 1603    Davonna Belling, MD 07/04/22 2245

## 2022-07-04 NOTE — ED Notes (Signed)
Back to stretcher with security and [employer] at [facility]. Sitter present at [facility]. Pt semi-cooperative with the scanning of medication. Not resisting.

## 2022-07-04 NOTE — ED Notes (Signed)
Pt has been refused to be dressed out properly. Slightly agitated.

## 2022-07-04 NOTE — ED Notes (Signed)
EDP at [facility] discussing plan, meds, TTS. Pt verbalizing wanting to leave. Explained that she could not. Pt verbalizes understanding. Pt into stretcer assuming a position of sleep and covering up with blanket. Security present.

## 2022-07-04 NOTE — ED Triage Notes (Addendum)
Pt arrived via EMS, states she has been out of her psychiatric medications and vomiting. Pt cooperative with VS, but not answering triage questions

## 2022-07-04 NOTE — Consult Note (Signed)
Unable to evaluate.  Patient just received Geodon after she ran off the ER. Will try later.

## 2022-07-04 NOTE — ED Notes (Signed)
Cooperative with security wanding at [facility], out of bed to [facility], then back to stretcher.

## 2022-07-05 DIAGNOSIS — F209 Schizophrenia, unspecified: Secondary | ICD-10-CM | POA: Diagnosis not present

## 2022-07-05 LAB — RAPID URINE DRUG SCREEN, HOSP PERFORMED
Amphetamines: NOT DETECTED
Barbiturates: NOT DETECTED
Benzodiazepines: POSITIVE — AB
Cocaine: NOT DETECTED
Opiates: NOT DETECTED
Tetrahydrocannabinol: NOT DETECTED

## 2022-07-05 MED ORDER — LORAZEPAM 1 MG PO TABS: 2.0000 mg | ORAL_TABLET | Freq: Once | ORAL | Status: DC

## 2022-07-05 MED ORDER — MIDAZOLAM HCL (PF) 10 MG/2ML IJ SOLN
2.0000 mg | Freq: Once | INTRAMUSCULAR | Status: AC
  Administered 2022-07-05: 2 mg via INTRAMUSCULAR
  Filled 2022-07-05: qty 2

## 2022-07-05 MED ORDER — CLONAZEPAM 1 MG PO TABS
1.0000 mg | ORAL_TABLET | Freq: Two times a day (BID) | ORAL | Status: DC
  Administered 2022-07-05 – 2022-07-06 (×3): 1 mg via ORAL
  Filled 2022-07-05 (×3): qty 1

## 2022-07-05 MED ORDER — LITHIUM CARBONATE 300 MG PO CAPS
300.0000 mg | ORAL_CAPSULE | Freq: Two times a day (BID) | ORAL | Status: DC
  Administered 2022-07-05 – 2022-07-06 (×3): 300 mg via ORAL
  Filled 2022-07-05 (×3): qty 1

## 2022-07-05 MED ORDER — STERILE WATER FOR INJECTION IJ SOLN
INTRAMUSCULAR | Status: AC
  Administered 2022-07-05: 1.3 mL
  Filled 2022-07-05: qty 10

## 2022-07-05 MED ORDER — ZIPRASIDONE MESYLATE 20 MG IM SOLR
20.0000 mg | Freq: Once | INTRAMUSCULAR | Status: AC
  Administered 2022-07-05: 20 mg via INTRAMUSCULAR
  Filled 2022-07-05: qty 20

## 2022-07-05 MED ORDER — DIVALPROEX SODIUM 500 MG PO DR TAB
500.0000 mg | DELAYED_RELEASE_TABLET | Freq: Two times a day (BID) | ORAL | Status: DC
  Administered 2022-07-05 (×2): 500 mg via ORAL
  Filled 2022-07-05 (×3): qty 1

## 2022-07-05 MED ORDER — STERILE WATER FOR INJECTION IJ SOLN
INTRAMUSCULAR | Status: AC
  Administered 2022-07-05: 1.2 mL
  Filled 2022-07-05: qty 10

## 2022-07-05 MED ORDER — FLUPHENAZINE HCL 5 MG PO TABS
10.0000 mg | ORAL_TABLET | Freq: Every day | ORAL | Status: DC
  Administered 2022-07-05: 10 mg via ORAL
  Filled 2022-07-05 (×2): qty 2

## 2022-07-05 MED ORDER — LORAZEPAM 1 MG PO TABS: 1.0000 mg | ORAL_TABLET | ORAL | Status: DC | PRN

## 2022-07-05 MED ORDER — LORAZEPAM 1 MG PO TABS
1.0000 mg | ORAL_TABLET | Freq: Once | ORAL | Status: AC
  Administered 2022-07-05: 1 mg via ORAL
  Filled 2022-07-05: qty 1

## 2022-07-05 MED ORDER — LISINOPRIL 10 MG PO TABS
5.0000 mg | ORAL_TABLET | Freq: Every day | ORAL | Status: DC
  Administered 2022-07-05 – 2022-07-06 (×2): 5 mg via ORAL
  Filled 2022-07-05 (×2): qty 1

## 2022-07-05 MED ORDER — MIDAZOLAM HCL (PF) 5 MG/ML IJ SOLN: 2.0000 mg | Freq: Once | INTRAMUSCULAR | Status: DC

## 2022-07-05 MED ORDER — ZIPRASIDONE MESYLATE 20 MG IM SOLR
20.0000 mg | INTRAMUSCULAR | Status: AC | PRN
  Administered 2022-07-05: 20 mg via INTRAMUSCULAR
  Filled 2022-07-05: qty 20

## 2022-07-05 MED ORDER — IBUPROFEN 200 MG PO TABS
400.0000 mg | ORAL_TABLET | Freq: Four times a day (QID) | ORAL | Status: DC | PRN
  Administered 2022-07-05: 400 mg via ORAL
  Filled 2022-07-05: qty 2

## 2022-07-05 MED ORDER — NICOTINE 21 MG/24HR TD PT24: 21.0000 mg | MEDICATED_PATCH | Freq: Every day | TRANSDERMAL | Status: DC

## 2022-07-05 MED ORDER — OLANZAPINE 5 MG PO TBDP: 5.0000 mg | ORAL_TABLET | Freq: Three times a day (TID) | ORAL | Status: DC | PRN

## 2022-07-05 NOTE — ED Notes (Signed)
Patient stated that she was going to place nicotine patch in her mouth; therefore, it was not given. Will continue to monitor.

## 2022-07-05 NOTE — Group Note (Unsigned)
Date:  07/05/2022 Time:  10:23 AM  Group Topic/Focus:  Goals Group:   The focus of this group is to help patients establish daily goals to achieve during treatment and discuss how the patient can incorporate goal setting into their daily lives to aide in recovery. Orientation:   The focus of this group is to educate the patient on the purpose and policies of crisis stabilization and provide a format to answer questions about their admission.  The group details unit policies and expectations of patients while admitted.     Participation Level:  {BHH PARTICIPATION WO:[id number]  Participation Quality:  {BHH PARTICIPATION QUALITY:22265}  Affect:  {BHH AFFECT:22266}  Cognitive:  {BHH COGNITIVE:22267}  Insight: {BHH Insight2:20797}  Engagement in Group:  {BHH ENGAGEMENT IN BP:8198245  Modes of Intervention:  {BHH MODES OF INTERVENTION:22269}  Additional Comments:  ***  Dub Mikes 07/05/2022, 10:23 AM

## 2022-07-05 NOTE — ED Notes (Signed)
Remains asleep respirations remain easy and continues to show no signs of distress.
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
## 2022-07-05 NOTE — Progress Notes (Signed)
[name] Progress Note  [id number]   Molly [name]  07/05/2022  10:18 AM  Description:   Inpatient Psychiatric Referral  Patient was recommended inpatient per Charmaine Downs, NP. There are no available beds at [facility]. Patient was referred to the following facilities:   Destination  Service Provider Address Phone Fax  [address] [place] [address] [phone]  [facility]  [address] [place] [address] [phone]  [place], [place] [id number] [phone]  [facility]  [address] [place], [place] [address] [phone]  [place]  [address] [place] [address] [phone]  [facility] [place] [address] [place]  [facility]  [address] [place] [address] [phone] [place]  [place], [place] [address] [phone]  [facility]  [id number] [place] [address] [phone] [facility]  [address] [place], [place] [address] [phone]  [facility]  [address] [place] [address] [phone]  [facility]  [place] [address] [phone] [facility]  [address] [place] [address] [place]  [address] [place] [address] [phone] [facility]  [address], [place] [address] [phone] [facility]  [address] [place] [address] [phone] [facility]  [address], [place] [address] [phone]  [facility]  [address] [place] [address] [place]  [facility]  [address], [place] [address] [phone] [place]  [address], [place] [address] [phone] [address] [place] [address] [phone]  [facility]  [address] [place] [address] [phone] [facility]  [place], [place] [address] [phone]  [facility]  [address] [place] [address] [place]  [facility]  [address], [place] [address] [phone]  [place] [address], [place] [address] [phone]  [facility]  [address], [place] [address] [phone]  [facility]  [address] [place] [address] [phone] [facility]  [address] [place] [address] [phone]  [facility] Office  [address], [place]  [address] [phone]    Situation ongoing, CSW to continue following and update chart as more information becomes available.      Denna Haggard, [place]  07/05/2022 10:18 AM

## 2022-07-05 NOTE — ED Notes (Signed)
Patient asking to speak to social worker, wanting a new SW, states she doesn't get along with her current SW, wants one from out of town/[place], states she does not want to stay in [place] once she leaves. Informed that we could only let out SW know of her concerns but could not help with changing SW.

## 2022-07-05 NOTE — Progress Notes (Signed)
Patient took down shower curtain and had in her room hiding food. Asked security to take out of room. Placed in clean dirty linen bag. Will email leadership on what to do with it.

## 2022-07-05 NOTE — Progress Notes (Signed)
D/c'd restraint order d/t patient not being in restraints during my shift. Will continue to monitor.

## 2022-07-05 NOTE — ED Notes (Signed)
Patient crying out intermittently disturbing the other patients. Notified Dr. Matilde Sprang. New order for ativan ordered.
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
## 2022-07-05 NOTE — ED Notes (Signed)
Pt is sleeping with no signs of distress or difficulty of any kind respiration are easy.

## 2022-07-05 NOTE — BH Assessment (Signed)
TTS clinician attempted to complete assessment. Per Mortimer Fries, RN, patient has been medicated and is asleep. TTS will attempt at later time.

## 2022-07-05 NOTE — ED Notes (Signed)
Patient has been at the window for the most part this afternoon, wanting everything from meds she isnt prescribed    bathroom stuff   more food and drinks  she has had some outburst of just screaming and crying loudly

## 2022-07-05 NOTE — ED Provider Notes (Signed)
Emergency Medicine Observation Re-evaluation Note  Molly [name] is a 37 y.o. female, seen on rounds today.  Pt initially presented to the ED for complaints of Emesis and Psychiatric Evaluation Currently, the patient is sleeping.  Physical Exam  BP (!) 149/80 (BP Location: Right Arm)   Pulse (!) 112   Temp 99.2 F (37.3 C) (Oral)   Resp (!) 22   SpO2 98%  Physical Exam General: nad Cardiac: tacycardia noted Lungs: normal effort Psych:   ED Course / MDM  EKG:   I have reviewed the labs performed to date as well as medications administered while in observation.  Recent changes in the last 24 hours include required dose of geodon..  Plan  Current plan is for psycho dispo, pt is medically cleared    Dorie Rank, MD 07/05/22 [phone]

## 2022-07-06 DIAGNOSIS — F200 Paranoid schizophrenia: Secondary | ICD-10-CM | POA: Diagnosis not present

## 2022-07-06 DIAGNOSIS — F209 Schizophrenia, unspecified: Secondary | ICD-10-CM | POA: Diagnosis not present

## 2022-07-06 MED ORDER — OLANZAPINE 10 MG PO TABS: 10.0000 mg | ORAL_TABLET | Freq: Two times a day (BID) | ORAL | 0 refills | Status: DC

## 2022-07-06 MED ORDER — DIVALPROEX SODIUM 250 MG PO DR TAB: 750.0000 mg | DELAYED_RELEASE_TABLET | Freq: Two times a day (BID) | ORAL | 0 refills | Status: DC

## 2022-07-06 NOTE — Consult Note (Signed)
[facility] ED [facility] DISCHARGE  Reason for Consult:  Psychosis Referring Physician:  Dr. Armandina Gemma Patient Identification: Molly [name] MRN:  [id number] ED Chief Complaint: Schizophrenia [facility])  Diagnosis:  Principal Problem:   Schizophrenia [facility])   ED Assessment Time Calculation: 45  minutes  HPI:   Molly [name] is a 37 y.o. female patient brought in by EMS with history of schizophrenia, noncompliant with medications for unknown amount of time. SHe is able to have a somewhat reasonable conversation that is linear with appropriate reasons. She continues to be homeless at this time. She has legal guardian Geologist, engineering and receiving services through [employer] [facility].   Subjective: I am ready to go. I need my foot stamp card but I can go today. I want to fire my legal guardian. I think the manager or supervisor should be my guardian, but not my current one. "   Patient is seen this morning sitting in the SAPPU. She is calm and cooperative, very pleasant and engaging. She introduces herself and has an appropriate conversation. She is redirectable and does not derail as much as she will historically. She is able to report that she has been compliant with her medications. We also discussed her recent episodes of aggression and agitation at the [facility], in which patient defends herself by saying " they are rude to me." She denies any combative or violent behaviors, just verbally agitation. She is open to receiving ongoing services through [facility] at this time, and ok with discharging to the [facility]  She continues to have a problem with food scourging, although she is observed eating breakfast and working on taking a shower prior to discharge. She does have some insight when she request hygiene products to take a shower.   Patient has NOT displayed any disruptive behaviors during this ER visit.  She does not appear to be responding to internal stimuli, external stimuli, and or exhibiting delusional thought disorder.  Presents with no symptoms of mania, however has loud speech. The patient denies any difficulties with sleep, irritability, guilt, loss of energy, decrease in concentration, anhedonia, psychomotor retardation or suicidal ideations. At the conclusion of the evaluation, the patient voiced no other concerns.   Contacted  legal guardian Molly [name]  Also attempted to contact ACT team at phone number for provided by patient at [phone], again unsuccessful ( per receptionist they are in the morning meeting.    Past Psychiatric History:  History significant for schizophrenia versus schizoaffective, homelessness, and noncompliance with medications.  Risk to Self or Others: Is the patient at risk to self? Yes Has the patient been a risk to self in the past 6 months? No Has the patient been a risk to self within the distant past? No Is the patient a risk to others? Yes Has the patient been a risk to others in the past 6 months? No Has the patient been a risk to others within the distant past? No  [place] Scale:  [place] ED from 06/11/2022 in [facility] Emergency Department at [facility] ED from 04/10/2022 in [facility] Emergency Department at [facility] ED from 04/08/2022 in [facility] Emergency Department at [place] No Risk No Risk No Risk       Past Medical History:  Past Medical History:  Diagnosis Date   Abnormal Pap smear    ASC-cannot exclude HGSIL on Pap 02/15/2012   ASC-US on 02/03/2012 pap (associated Trichomonas infection). No reflex HPV testing performed  on specimen.  Patient informed that she will need repeat Pap in one year.       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 Henrene Dodge (Psych) At [facility] in  [place], [place]. 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   Qualifier: Diagnosis of  By: McDiarmid MD, Jolyn Nap USER 02/08/2009   Qualifier: Diagnosis of  By: Samara Snide      Past Surgical History:  Procedure Laterality Date   INCISION AND DRAINAGE     pilanodal cyst   TIBIA IM NAIL INSERTION Right 10/26/2021   Procedure: INTRAMEDULLARY (IM) NAIL TIBIAL;  Surgeon: Altamese Delhi, MD;  Location: [place];  Service: Orthopedics;  Laterality: Right;   TOOTH EXTRACTION N/A 10/07/2017   Procedure: EXTRACTION TEETH NUMBERS ONE, SEVENTEEN, NINETEEN AND THIRTY TWO;  Surgeon: Diona Browner, DDS;  Location: [place];  Service: Oral Surgery;  Laterality: N/A;   Family History:  Family History  Adopted: Yes  Problem Relation Age of Onset   Bipolar disorder Sister    Alcohol abuse Brother    Cancer Father    Diabetes Mother    Social History:  Social History   Substance and Sexual Activity  Alcohol Use No   Comment: occ     Social History   Substance and Sexual Activity  Drug Use No    Social History   Socioeconomic History   Marital status: Single    Spouse name: Not on file   Number of children: Not on file   Years of education: Not on file   Highest education level: Not on file  Occupational History   Not on file  Tobacco Use   Smoking status: Every Day    Packs/day: 2.00    Years: 10.00    Total pack years: 20.00    Types: Cigarettes   Smokeless tobacco: Never  Vaping Use   Vaping Use: Never used  Substance and Sexual Activity   Alcohol use: No    Comment: occ   Drug use: No   Sexual activity: Yes    Birth control/protection:  Injection  Other Topics Concern   Not on file  Social History Narrative   Adopted   Has Guardian   Living in [place] home with [name]   Transportation: Bus   Social Determinants of Health   Financial Resource Strain: Not on file  Food Insecurity: Not on file  Transportation Needs: Not on file  Physical Activity: Not on file  Stress: Not on file  Social Connections: Not on file    Allergies:   Allergies  Allergen Reactions   Abilify [Aripiprazole] Other (See Comments)    Thinks it's nasty- does not want it.  Injection is ok.      Labs:  Results for orders placed or performed during the hospital encounter of 07/04/22 (  from the past 48 hour(s))  Comprehensive metabolic panel     Status: Abnormal   Collection Time: 07/04/22  1:16 PM  Result Value Ref Range   Sodium 137 135 - 145 mmol/L   Potassium 4.0 3.5 - 5.1 mmol/L   Chloride 107 98 - 111 mmol/L   CO2 21 (L) 22 - 32 mmol/L   Glucose, Bld 89 70 - 99 mg/dL    Comment: Glucose reference range applies only to samples taken after fasting for at least 8 hours.   BUN 10 6 - 20 mg/dL   Creatinine, Ser 0.82 0.44 - 1.00 mg/dL   Calcium 8.9 8.9 - 10.3 mg/dL   Total Protein 7.3 6.5 - 8.1 g/dL   Albumin 3.6 3.5 - 5.0 g/dL   AST 16 15 - 41 U/L   ALT 13 0 - 44 U/L   Alkaline Phosphatase 93 38 - 126 U/L   Total Bilirubin 0.4 0.3 - 1.2 mg/dL   GFR, Estimated >60 >60 mL/min    Comment: (NOTE) Calculated using the CKD-EPI Creatinine Equation (2021)    Anion gap 9 5 - 15    Comment: Performed at [facility], [place] [address], [place], [place] [address]  CBC     Status: Abnormal   Collection Time: 07/04/22  1:16 PM  Result Value Ref Range   WBC 13.5 (H) 4.0 - 10.5 K/uL   RBC 3.86 (L) 3.87 - 5.11 MIL/uL   Hemoglobin 11.3 (L) 12.0 - 15.0 g/dL   HCT 34.7 (L) 36.0 - 46.0 %   MCV 89.9 80.0 - 100.0 fL   MCH 29.3 26.0 - 34.0 pg   MCHC 32.6 30.0 - 36.0 g/dL   RDW 14.6 11.5 - 15.5 %   Platelets 460 (H) 150 -  400 K/uL   nRBC 0.0 0.0 - 0.2 %    Comment: Performed at [facility], [place] [address] [place], [place] [address]  Urine rapid drug screen (hosp performed)     Status: Abnormal   Collection Time: 07/04/22  1:16 PM  Result Value Ref Range   Opiates NONE DETECTED NONE DETECTED   Cocaine NONE DETECTED NONE DETECTED   Benzodiazepines POSITIVE (A) NONE DETECTED   Amphetamines NONE DETECTED NONE DETECTED   Tetrahydrocannabinol NONE DETECTED NONE DETECTED   Barbiturates NONE DETECTED NONE DETECTED    Comment: (NOTE) DRUG SCREEN FOR MEDICAL PURPOSES ONLY.  IF CONFIRMATION IS NEEDED FOR ANY PURPOSE, NOTIFY LAB WITHIN 5 DAYS.  LOWEST DETECTABLE LIMITS FOR URINE DRUG SCREEN Drug Class                     Cutoff (ng/mL) Amphetamine and metabolites    1000 Barbiturate and metabolites    200 Benzodiazepine                 200 Opiates and metabolites        300 Cocaine and metabolites        300 THC                            50 Performed at [facility], [place] [address] [place], [place] [address]   Valproic acid level     Status: Abnormal   Collection Time: 07/04/22  1:17 PM  Result Value Ref Range   Valproic Acid Lvl 18 (L) 50.0 - 100.0 ug/mL    Comment: Performed at [facility], [address] [place], [place] [address]  Lithium level     Status: Abnormal   Collection Time: 07/04/22  1:17 PM  Result Value Ref Range   Lithium Lvl 0.18 (L) 0.60 - 1.20 mmol/L    Comment: Performed at [facility], [place] [address], [place], [place] [address]  Ethanol     Status: None   Collection Time: 07/04/22  1:22 PM  Result Value Ref Range   Alcohol, Ethyl (B) <10 <10 mg/dL    Comment: (NOTE) Lowest detectable limit for serum alcohol is 10 mg/dL.  For medical purposes only. Performed at [facility], [place] [address], [place], [place] [address]   Acetaminophen level     Status: Abnormal   Collection  Time: 07/04/22  1:22 PM  Result Value Ref Range   Acetaminophen (Tylenol), Serum <10 (L) 10 - 30 ug/mL    Comment: (NOTE) Therapeutic concentrations vary significantly. A range of 10-30 ug/mL  may be an effective concentration for many patients. However, some  are best treated at concentrations outside of this range. Acetaminophen concentrations >150 ug/mL at 4 hours after ingestion  and >50 ug/mL at 12 hours after ingestion are often associated with  toxic reactions.  Performed at [facility], [place] [address] [place], [place] [id number]   Salicylate level     Status: Abnormal   Collection Time: 07/04/22  1:22 PM  Result Value Ref Range   Salicylate Lvl Q000111Q (L) 7.0 - 30.0 mg/dL    Comment: Performed at [facility], [place] [address] [place], [place] [address]  Blood gas, venous     Status: Abnormal   Collection Time: 07/04/22  1:22 PM  Result Value Ref Range   pH, Ven 7.41 7.25 - 7.43   pCO2, Ven 40 (L) 44 - 60 mmHg   pO2, Ven 77 (H) 32 - 45 mmHg   Bicarbonate 25.4 20.0 - 28.0 mmol/L   Acid-Base Excess 0.7 0.0 - 2.0 mmol/L   O2 Saturation 96.2 %   Patient temperature 37.0     Comment: Performed at [facility], [place] [address] [place], [place] [address]  I-Stat beta hCG blood, ED     Status: None   Collection Time: 07/04/22  2:43 PM  Result Value Ref Range   I-stat hCG, quantitative <5.0 <5 mIU/mL   Comment 3            Comment:   GEST. AGE      CONC.  (mIU/mL)   <=1 WEEK        5 - 50     2 WEEKS       50 - 500     3 WEEKS       100 - 10,000     4 WEEKS     1,000 - 30,000        FEMALE AND NON-PREGNANT FEMALE:     LESS THAN 5 mIU/mL   Resp panel by RT-PCR (RSV, Flu A&B, Covid) Anterior Nasal Swab     Status: None   Collection Time: 07/04/22  3:29 PM   Specimen: Anterior Nasal Swab  Result Value Ref Range   SARS Coronavirus 2 by RT PCR NEGATIVE NEGATIVE    Comment: (NOTE) SARS-CoV-2 target nucleic acids are NOT  DETECTED.  The SARS-CoV-2 RNA is generally detectable in upper respiratory specimens during the acute phase of infection. The lowest concentration of SARS-CoV-2 viral copies this assay can detect is 138  copies/mL. A negative result does not preclude SARS-Cov-2 infection and should not be used as the sole basis for treatment or other patient management decisions. A negative result may occur with  improper specimen collection/handling, submission of specimen other than nasopharyngeal swab, presence of viral mutation(s) within the areas targeted by this assay, and inadequate number of viral copies(<138 copies/mL). A negative result must be combined with clinical observations, patient history, and epidemiological information. The expected result is Negative.  Fact Sheet for Patients:  EntrepreneurPulse.com.au  Fact Sheet for Healthcare Providers:  IncredibleEmployment.be  This test is no t yet approved or cleared by the [place] FDA and  has been authorized for detection and/or diagnosis of SARS-CoV-2 by FDA under an Emergency Use Authorization (EUA). This EUA will remain  in effect (meaning this test can be used) for the duration of the COVID-19 declaration under Section 564(b)(1) of the Act, 21 U.S.C.section 360bbb-3(b)(1), unless the authorization is terminated  or revoked sooner.       Influenza A by PCR NEGATIVE NEGATIVE   Influenza B by PCR NEGATIVE NEGATIVE    Comment: (NOTE) The Xpert Xpress SARS-CoV-2/FLU/RSV plus assay is intended as an aid in the diagnosis of influenza from Nasopharyngeal swab specimens and should not be used as a sole basis for treatment. Nasal washings and aspirates are unacceptable for Xpert Xpress SARS-CoV-2/FLU/RSV testing.  Fact Sheet for Patients: EntrepreneurPulse.com.au  Fact Sheet for Healthcare Providers: IncredibleEmployment.be  This test is not yet approved or  cleared by the [place] FDA and has been authorized for detection and/or diagnosis of SARS-CoV-2 by FDA under an Emergency Use Authorization (EUA). This EUA will remain in effect (meaning this test can be used) for the duration of the COVID-19 declaration under Section 564(b)(1) of the Act, 21 U.S.C. section 360bbb-3(b)(1), unless the authorization is terminated or revoked.     Resp Syncytial Virus by PCR NEGATIVE NEGATIVE    Comment: (NOTE) Fact Sheet for Patients: EntrepreneurPulse.com.au  Fact Sheet for Healthcare Providers: IncredibleEmployment.be  This test is not yet approved or cleared by the [place] FDA and has been authorized for detection and/or diagnosis of SARS-CoV-2 by FDA under an Emergency Use Authorization (EUA). This EUA will remain in effect (meaning this test can be used) for the duration of the COVID-19 declaration under Section 564(b)(1) of the Act, 21 U.S.C. section 360bbb-3(b)(1), unless the authorization is terminated or revoked.  Performed at [facility], [place] [address], [place], [place] [address]     Current Facility-Administered Medications  Medication Dose Route Frequency Provider Last Rate Last Admin   clonazePAM (KLONOPIN) tablet 1 mg  1 mg Oral BID Charmaine Downs C, NP   1 mg at 07/05/22 2115   divalproex (DEPAKOTE ER) 24 hr tablet 750 mg  750 mg Oral Daily Davonna Belling, MD   750 mg at 07/05/22 0931   divalproex (DEPAKOTE) DR tablet 500 mg  500 mg Oral BID Dorie Rank, MD   500 mg at 07/05/22 2115   fluPHENAZine (PROLIXIN) tablet 10 mg  10 mg Oral Pete Glatter, MD   10 mg at 07/05/22 2127   ibuprofen (ADVIL) tablet 400 mg  400 mg Oral Q6H PRN Dorie Rank, MD   400 mg at 07/05/22 1503   lisinopril (ZESTRIL) tablet 5 mg  5 mg Oral Daily Dorie Rank, MD   5 mg at 07/05/22 0932   lithium carbonate capsule 300 mg  300 mg Oral BID Dorie Rank, MD   300 mg at  07/05/22 2115    OLANZapine zydis (ZYPREXA) disintegrating tablet 5 mg  5 mg Oral Q8H PRN Charmaine Downs C, NP       And   LORazepam (ATIVAN) tablet 1 mg  1 mg Oral PRN Charmaine Downs C, NP       nicotine (NICODERM CQ - dosed in mg/24 hours) patch 21 mg  21 mg Transdermal Daily Kommor, [place], MD       Current Outpatient Medications  Medication Sig Dispense Refill   albuterol (VENTOLIN HFA) 108 (90 Base) MCG/ACT inhaler Inhale 1 puff into the lungs every 4 (four) hours as needed for wheezing or shortness of breath.     benztropine (COGENTIN) 1 MG tablet Take 1 mg by mouth 2 (two) times daily. (Patient not taking: Reported on 06/12/2022)     divalproex (DEPAKOTE) 500 MG DR tablet Take 1 tablet (500 mg total) by mouth 2 (two) times daily for 7 days. 14 tablet 0   fluPHENAZine (PROLIXIN) 10 MG tablet Take 10 mg by mouth at bedtime.     fluPHENAZine decanoate (PROLIXIN) 25 MG/ML injection Inject 25 mg into the muscle every 21 ( twenty-one) days. Due on 06-08-22     haloperidol (HALDOL) 10 MG tablet Take 1 tablet (10 mg total) by mouth 2 (two) times daily. (Patient not taking: Reported on 06/12/2022) 60 tablet 0   lisinopril (ZESTRIL) 5 MG tablet Take 5 mg by mouth daily.     lithium carbonate 300 MG capsule Take 300 mg by mouth 2 (two) times daily.     OLANZapine (ZYPREXA) 10 MG tablet Take 1 tablet (10 mg total) by mouth daily for 7 days. (Patient not taking: Reported on 06/12/2022) 7 tablet 0   paliperidone (INVEGA SUSTENNA) 156 MG/ML SUSY injection Inject 156 mg into the muscle once. (Patient not taking: Reported on 04/13/2022)      Psychiatric Specialty Exam: Presentation  General Appearance: Disheveled  Eye Contact:Fair  Speech:Clear and Coherent; Normal Rate  Speech Volume:Normal  Handedness:Right   Mood and Affect  Mood:Euthymic  Affect:Blunt; Full Range   Thought Process  Thought Processes:Coherent; Linear  Descriptions of Associations:Intact  Orientation:Full (Time, Place and  Person)  Thought Content:Rumination  History of Schizophrenia/Schizoaffective disorder:Yes  Duration of Psychotic Symptoms:Greater than six months  Hallucinations:Hallucinations: None  Ideas of Reference:None  Suicidal Thoughts:Suicidal Thoughts: No  Homicidal Thoughts:Homicidal Thoughts: No   Sensorium  Memory:Immediate Fair; Recent Fair; Remote Fair  Judgment:Fair  Insight:Fair   Executive Functions  Concentration:Fair  Attention Span:Fair  [place]   Psychomotor Activity  Psychomotor Activity:Psychomotor Activity: Normal   Assets  Assets:Communication Skills; Social Support    Sleep  Sleep:Sleep: Fair   Physical Exam: Physical Exam Vitals and nursing note reviewed.  Constitutional:      Appearance: She is normal weight.  Neurological:     Mental Status: She is alert. She is disoriented.  Psychiatric:        Attention and Perception: She perceives auditory hallucinations.        Mood and Affect: Affect is labile.        Behavior: Behavior is agitated.        Thought Content: Thought content is paranoid and delusional.    Review of Systems  Psychiatric/Behavioral:  Positive for hallucinations.    Blood pressure (!) 136/94, pulse 97, temperature 97.8 F (36.6 C), temperature source Oral, resp. rate 18, SpO2 100 %, unknown if currently breastfeeding. There is no height or weight on file to  calculate BMI.  Medical Decision Making: Patient case reviewed and discussed with Dr. Dwyane Dee.  She no longer meets criteria for inpatient psychiatric. Patient with a history of long standing schizophrenia with evidence of rapid decompensation with medication noncompliance. She will stabilize with medication, goal is to work with act team to determine which LAI she is on and administer during this ER visit or in the community. Long acting injection has proven to help with stabilization.   Problem 1: Schizophrenia -  Depakote 750 mg BID -Zyprexa '10mg'$  BID -Continue agitation protocol. -Will continue to attempt to contact to ACT team. -Rescind IVC at this time.   Disposition: No evidence of imminent risk to self or others at present.   Patient does not meet criteria for psychiatric inpatient admission. Supportive therapy provided about ongoing stressors.  Suella Broad, FNP 07/06/2022 10:03 AM

## 2022-07-06 NOTE — ED Notes (Signed)
Patient  discharged off unit to home per provider.  Patient alert and n s/s of distress. Patient discharge information given to patient . Patient belongings given to patient .  Patient ambulatory off unit, escorted by NT and security. Patient given a bus pass for transportation.

## 2022-07-06 NOTE — Discharge Instructions (Signed)
You were seen for your schizophrenia in the emergency department.   At home, please take the new dose of the Depakote and Zyprexa that we have prescribed you.    Follow-up with your primary doctor in 2-3 days regarding your visit.  Follow-up with your psychiatrist for the behavioral health urgent care in 2 to 3 days to discuss your symptoms.  Return immediately to the emergency department if you experience any of the following: Audio or visual hallucinations, thoughts of harming yourself or others, or any other concerning symptoms.    Thank you for visiting our Emergency Department. It was a pleasure taking care of you today.

## 2022-07-06 NOTE — Progress Notes (Signed)
[name] Progress Note  [id number]   Molly [name]  07/06/2022  9:28 AM  Description:   Inpatient Psychiatric Referral  Patient was recommended inpatient per Ricky Ala, NP. There are no available beds at [facility]. Patient was referred to the following facilities:   Destination  Service Provider Address Phone Fax  [address] [place] [address] [phone]  [facility]  [address] [place] [address] [phone]  [place], [place] [id number] [phone]  [facility] [place]  [address] [place], [place] [address] [phone]  [place]  [address] [place] [address] [phone]  [facility] [place] [address] [place]  [facility]  [address], [place] [address] [phone] [place]  [place], [place] [address] [phone]  [facility]  [id number] [place] [address] [phone] [facility]  [address] [place], [place] [address] [phone]  [facility]  [address] [place] [address] [phone]  [facility]  [place] [address] [phone] [facility]  [address] [place] [address] [place]  [address], [place] [address] [phone] [facility]  [address] [place] [address] [phone] [facility]  [address] [place] [address] [phone] [facility]  [address] [place] [address] [phone]  [facility]  [address] [place] [address] [place]  [facility]  [address] [place] [address] [phone] [place]  [address], [place] [address] [phone] [place] [address] [phone]  [facility]  [address], [place] [address] [phone] [name]  [place], [place] [address] [phone]  [facility]  [address] [place] [address] [place]  [facility]  [address], [place] [address] [phone]  [place] [address], [place] [address] [phone]  [facility]  [address], [place] [address] [phone]  [facility]  [address] [place] [address] [phone] [facility]  [address] [place] [address] [phone]  [facility] Office  [address] [place] [address] [phone]     Situation ongoing, CSW to continue following and update chart as more information becomes available.      Denna Haggard, [place]  07/06/2022 9:28 AM

## 2022-07-06 NOTE — ED Provider Notes (Addendum)
Reevaluated patient this morning after psychiatry is cleared her..  Has not had any aggressive behavior.  Did exhibit some bizarre behavior last night but has not required any intramuscular medications recently.  Patient feels that she would be safe for discharge and request that her medications be sent to [facility] or [facility] on [address].  Will resend IVC at this time and dc.   Legal guardian Barbarann Ehlers [facility]) reached at [phone] and notified of discharge.  Recommends discussing her medication changes with ACT at [phone] or Remo Lipps from [employer] [facility] at [phone]. Theodoro Parma from ACT team recommend sending them to [place].  Pharmacy updated and medication sent.   Fransico Meadow, MD 07/06/22 [phone]

## 2022-07-06 NOTE — ED Provider Notes (Signed)
Emergency Medicine Observation Re-evaluation Note  Molly [name] is a 37 y.o. female, seen on rounds today.  Pt initially presented to the ED for complaints of Emesis and Psychiatric Evaluation Currently, the patient is resting comfortably with no complaints.  Physical Exam  BP (!) 136/94 (BP Location: Right Arm)   Pulse 97   Temp 97.8 F (36.6 C) (Oral)   Resp 18   SpO2 100%  Physical Exam General: Resting comfortably in stretcher Lungs: Normal work of breathing Psych: Calm  ED Course / MDM  EKG:   I have reviewed the labs performed to date as well as medications administered while in observation.  Recent changes in the last 24 hours include intermittently cried out and disturbed other patients.  Was ordered for Ativan.  Plan  Current plan is for placement.   Fransico Meadow, MD 07/06/22 [phone]

## 2022-07-08 ENCOUNTER — Other Ambulatory Visit: Payer: Self-pay

## 2022-07-08 ENCOUNTER — Encounter (HOSPITAL_COMMUNITY): Payer: Self-pay

## 2022-07-08 ENCOUNTER — Emergency Department (HOSPITAL_COMMUNITY)
Admission: EM | Admit: 2022-07-08 | Discharge: 2022-07-08 | Disposition: A | Discharge status: Home / Self Care | Payer: 59 | Attending: Emergency Medicine | Admitting: Emergency Medicine

## 2022-07-08 DIAGNOSIS — R443 Hallucinations, unspecified: Secondary | ICD-10-CM | POA: Diagnosis present

## 2022-07-08 DIAGNOSIS — Z91148 Patient's other noncompliance with medication regimen for other reason: Secondary | ICD-10-CM | POA: Diagnosis not present

## 2022-07-08 MED ORDER — OLANZAPINE 10 MG PO TABS
10.0000 mg | ORAL_TABLET | Freq: Every day | ORAL | Status: DC
  Administered 2022-07-08: 10 mg via ORAL
  Filled 2022-07-08: qty 1

## 2022-07-08 MED ORDER — OLANZAPINE 10 MG PO TABS: 10.0000 mg | ORAL_TABLET | Freq: Every day | ORAL | Status: DC

## 2022-07-08 MED ORDER — DIVALPROEX SODIUM 250 MG PO DR TAB
250.0000 mg | DELAYED_RELEASE_TABLET | Freq: Once | ORAL | Status: AC
  Administered 2022-07-08: 250 mg via ORAL
  Filled 2022-07-08: qty 1

## 2022-07-08 NOTE — ED Notes (Signed)
Pt with MD at bedside, denies SI/HI.

## 2022-07-08 NOTE — Discharge Instructions (Signed)
Please follow up with your ACT team for your medications.

## 2022-07-08 NOTE — ED Triage Notes (Signed)
[employer] called out to the [facility] bc pt was screaming. With EMS pt was having hallucinations and screaming. Pt has been off of psych meds for unknown amount of time.

## 2022-07-08 NOTE — ED Notes (Addendum)
Pt. Smoking a cigarette at bedside. Pt. Belongings taken from pt and placed at nurses station by CN and tech.

## 2022-07-08 NOTE — ED Notes (Signed)
Pt refused VS. No signs of distress. Pt will not rolld

## 2022-07-08 NOTE — ED Provider Notes (Signed)
[place] AT [facility] Provider Note   CSN: [id number] Arrival date & time: 07/08/22  0217     History  Chief Complaint  Patient presents with   Hallucinations    Molly [name] is a 37 y.o. female.  37 year old female brought in by EMS from the [facility] for screening.  Patient denies suicidal or homicidal ideations.  States that she has not taken her medications that she was discharged from the hospital less than 48 hours ago, states that her ACT team has not gotten her medication yet.  She denies chest pain, abdominal pain, vomiting or any other complaints or concerns tonight.       Home Medications Prior to Admission medications   Medication Sig Start Date End Date Taking? Authorizing Provider  albuterol (VENTOLIN HFA) 108 (90 Base) MCG/ACT inhaler Inhale 1 puff into the lungs every 4 (four) hours as needed for wheezing or shortness of breath.    [provider]  benztropine (COGENTIN) 1 MG tablet Take 1 mg by mouth 2 (two) times daily. Patient not taking: Reported on 06/12/2022 03/02/22   [provider]  divalproex (DEPAKOTE) 250 MG DR tablet Take 3 tablets (750 mg total) by mouth 2 (two) times daily. 07/06/22 08/05/22  Fransico Meadow, MD  fluPHENAZine (PROLIXIN) 10 MG tablet Take 10 mg by mouth at bedtime. 05/25/22   [provider]  fluPHENAZine decanoate (PROLIXIN) 25 MG/ML injection Inject 25 mg into the muscle every 21 ( twenty-one) days. Due on 06-08-22    [provider]  haloperidol (HALDOL) 10 MG tablet Take 1 tablet (10 mg total) by mouth 2 (two) times daily. Patient not taking: Reported on 06/12/2022 01/29/22   Lindon Romp A, NP  lisinopril (ZESTRIL) 5 MG tablet Take 5 mg by mouth daily. 03/31/22   [provider]  lithium carbonate 300 MG capsule Take 300 mg by mouth 2 (two) times daily. 05/25/22   [provider]  OLANZapine (ZYPREXA) 10 MG tablet Take 1 tablet (10 mg total) by mouth in  the morning and at bedtime. 07/06/22 08/05/22  Fransico Meadow, MD  paliperidone (INVEGA SUSTENNA) 156 MG/ML SUSY injection Inject 156 mg into the muscle once. Patient not taking: Reported on 04/13/2022    [provider]      Allergies    Abilify [aripiprazole]    Review of Systems   Review of Systems Negative except as per HPI Physical Exam Updated Vital Signs BP 112/66   Pulse (!) 122   Temp 98.4 F (36.9 C)   Resp 18   Ht '5\' 8"'$  (1.727 m)   Wt 124 kg   SpO2 100%   BMI 41.57 kg/m  Physical Exam Vitals and nursing note reviewed.  Constitutional:      General: She is not in acute distress.    Appearance: She is well-developed. She is not diaphoretic.  HENT:     Head: Normocephalic and atraumatic.  Cardiovascular:     Rate and Rhythm: Normal rate and regular rhythm.     Heart sounds: Normal heart sounds.  Pulmonary:     Effort: Pulmonary effort is normal.     Breath sounds: Normal breath sounds.  Skin:    General: Skin is warm and dry.     Findings: No erythema or rash.  Neurological:     General: No focal deficit present.     Mental Status: She is alert.  Psychiatric:        Behavior: Behavior  normal.     ED Results / Procedures / Treatments   Labs (all labs ordered are listed, but only abnormal results are displayed) Labs Reviewed - No data to display  EKG None  Radiology No results found.  Procedures Procedures    Medications Ordered in ED Medications  OLANZapine (ZYPREXA) tablet 10 mg (10 mg Oral Given 07/08/22 0311)  divalproex (DEPAKOTE) DR tablet 250 mg (250 mg Oral Given 07/08/22 [id number])    ED Course/ Medical Decision Making/ A&P                             Medical Decision Making Risk Prescription drug management.   37 year old brought in by [facility] after she was kicked out of the [facility] for screening.  Patient was discharged from the emergency room less than 48 hours ago.  States that she has not gotten her prescriptions from her ACT  team.  Patient is provided with dose of her regular home medications.  Call to patient's guardian, currently overnight on-call DSS provider who will reach out to patient's ACT team in the morning.  Patient is not suicidal or homicidal. Patient found smoking in her room. Felt stable for dc after home medications provided and plan for ACT team follow up in the day time.   Call to [facility] [employer] after hours (guardian)- Wilburn Cornelia, will send Regino Schultze an email regarding tonight's visit.          Final Clinical Impression(s) / ED Diagnoses Final diagnoses:  Non compliance w medication regimen    Rx / DC Orders ED Discharge Orders     None         Tacy Learn, PA-C 07/08/22 [id number]    Ezequiel Essex, MD 07/08/22 [phone]

## 2022-07-10 ENCOUNTER — Emergency Department (HOSPITAL_COMMUNITY)
Admission: EM | Admit: 2022-07-10 | Discharge: 2022-07-10 | Disposition: A | Discharge status: Home / Self Care | Payer: 59 | Attending: Emergency Medicine | Admitting: Emergency Medicine

## 2022-07-10 ENCOUNTER — Other Ambulatory Visit: Payer: Self-pay

## 2022-07-10 DIAGNOSIS — F209 Schizophrenia, unspecified: Secondary | ICD-10-CM | POA: Diagnosis not present

## 2022-07-10 DIAGNOSIS — Z79899 Other long term (current) drug therapy: Secondary | ICD-10-CM | POA: Diagnosis not present

## 2022-07-10 DIAGNOSIS — Z720 Tobacco use: Secondary | ICD-10-CM | POA: Diagnosis not present

## 2022-07-10 DIAGNOSIS — Z760 Encounter for issue of repeat prescription: Secondary | ICD-10-CM | POA: Insufficient documentation

## 2022-07-10 DIAGNOSIS — E119 Type 2 diabetes mellitus without complications: Secondary | ICD-10-CM | POA: Insufficient documentation

## 2022-07-10 DIAGNOSIS — Z91148 Patient's other noncompliance with medication regimen for other reason: Secondary | ICD-10-CM | POA: Insufficient documentation

## 2022-07-10 MED ORDER — OLANZAPINE 10 MG PO TABS: 10.0000 mg | ORAL_TABLET | Freq: Two times a day (BID) | ORAL | 0 refills | Status: DC

## 2022-07-10 MED ORDER — DIVALPROEX SODIUM 250 MG PO DR TAB: 750.0000 mg | DELAYED_RELEASE_TABLET | Freq: Two times a day (BID) | ORAL | 0 refills | Status: DC

## 2022-07-10 NOTE — ED Triage Notes (Signed)
Pt arrived via POV stating they need a med refill. Pt unsure which one, and denies SI/HI to this RN.

## 2022-07-10 NOTE — ED Provider Notes (Signed)
[place] AT [facility] Provider Note   CSN: [id number] Arrival date & time: 07/10/22  1122     History  Chief Complaint  Patient presents with   Medication Refill    Molly [name] is a 37 y.o. female.  Patient presents the emergency room requesting that her olanzapine and Depakote be sent to a different pharmacy than previously sent to her to visit a few days ago.  Patient has recent been evaluated by psychiatry due to underlying schizophrenia.  She has apparently not had medications for the past 3 to 4 days.  She states that she is unable to get to the pharmacy where they were sent and needs one closer to her home.  Past medical history significant for schizophrenia, schizoaffective disorder, tobacco user, DM  HPI     Home Medications Prior to Admission medications   Medication Sig Start Date End Date Taking? Authorizing Provider  albuterol (VENTOLIN HFA) 108 (90 Base) MCG/ACT inhaler Inhale 1 puff into the lungs every 4 (four) hours as needed for wheezing or shortness of breath.    [provider]  benztropine (COGENTIN) 1 MG tablet Take 1 mg by mouth 2 (two) times daily. Patient not taking: Reported on 06/12/2022 03/02/22   [provider]  divalproex (DEPAKOTE) 250 MG DR tablet Take 3 tablets (750 mg total) by mouth 2 (two) times daily. 07/10/22 08/09/22  Dorothyann Peng, PA-C  fluPHENAZine (PROLIXIN) 10 MG tablet Take 10 mg by mouth at bedtime. 05/25/22   [provider]  fluPHENAZine decanoate (PROLIXIN) 25 MG/ML injection Inject 25 mg into the muscle every 21 ( twenty-one) days. Due on 06-08-22    [provider]  haloperidol (HALDOL) 10 MG tablet Take 1 tablet (10 mg total) by mouth 2 (two) times daily. Patient not taking: Reported on 06/12/2022 01/29/22   Lindon Romp A, NP  lisinopril (ZESTRIL) 5 MG tablet Take 5 mg by mouth daily. 03/31/22   [provider]  lithium carbonate 300 MG capsule Take 300 mg  by mouth 2 (two) times daily. 05/25/22   [provider]  OLANZapine (ZYPREXA) 10 MG tablet Take 1 tablet (10 mg total) by mouth in the morning and at bedtime. 07/10/22 08/09/22  Dorothyann Peng, PA-C  paliperidone (INVEGA SUSTENNA) 156 MG/ML SUSY injection Inject 156 mg into the muscle once. Patient not taking: Reported on 04/13/2022    [provider]      Allergies    Abilify [aripiprazole]    Review of Systems   Review of Systems  Psychiatric/Behavioral:  Negative for agitation.     Physical Exam Updated Vital Signs BP (!) 152/107 (BP Location: Left Arm)   Pulse (!) 102   Temp 98 F (36.7 C) (Oral)   Resp 18   SpO2 100%  Physical Exam HENT:     Head: Normocephalic and atraumatic.  Eyes:     Conjunctiva/sclera: Conjunctivae normal.  Pulmonary:     Effort: Pulmonary effort is normal. No respiratory distress.  Musculoskeletal:        General: No signs of injury.     Cervical back: Normal range of motion.  Skin:    General: Skin is dry.  Neurological:     Mental Status: She is alert.  Psychiatric:        Speech: Speech normal.        Behavior: Behavior normal.     ED Results / Procedures / Treatments   Labs (all labs ordered are  listed, but only abnormal results are displayed) Labs Reviewed - No data to display  EKG None  Radiology No results found.  Procedures Procedures    Medications Ordered in ED Medications - No data to display  ED Course/ Medical Decision Making/ A&P                             Medical Decision Making Risk Prescription drug management.   Patient presents requesting medication refill.  This was provided to the preferred pharmacy.  Phone call was made to [facility] to inform them of the refill and visit.   Discussed with the on/call weekend coverage at [place]. Patient to be discharged home at this time.         Final Clinical Impression(s) / ED Diagnoses Final diagnoses:  Noncompliance with  medications  Schizophrenia, unspecified type ([place])    Rx / DC Orders ED Discharge Orders          Ordered    divalproex (DEPAKOTE) 250 MG DR tablet  2 times daily        07/10/22 1202    OLANZapine (ZYPREXA) 10 MG tablet  2 times daily        07/10/22 1202              Ronny Bacon 07/10/22 1225    Wyvonnia Dusky, MD 07/10/22 1314

## 2022-07-10 NOTE — Discharge Instructions (Signed)
Your depakote and zyprexa were refilled today. Please follow up with your primary care/behavioral health team for further refills as needed.

## 2022-07-22 ENCOUNTER — Emergency Department (HOSPITAL_COMMUNITY)
Admission: EM | Admit: 2022-07-22 | Discharge: 2022-07-23 | Disposition: A | Discharge status: Home / Self Care | Payer: 59 | Attending: Emergency Medicine | Admitting: Emergency Medicine

## 2022-07-22 ENCOUNTER — Other Ambulatory Visit: Payer: Self-pay

## 2022-07-22 DIAGNOSIS — Z79899 Other long term (current) drug therapy: Secondary | ICD-10-CM | POA: Diagnosis not present

## 2022-07-22 DIAGNOSIS — I10 Essential (primary) hypertension: Secondary | ICD-10-CM | POA: Diagnosis not present

## 2022-07-22 DIAGNOSIS — Z113 Encounter for screening for infections with a predominantly sexual mode of transmission: Secondary | ICD-10-CM | POA: Insufficient documentation

## 2022-07-22 DIAGNOSIS — J45909 Unspecified asthma, uncomplicated: Secondary | ICD-10-CM | POA: Insufficient documentation

## 2022-07-22 NOTE — ED Triage Notes (Signed)
Pt states she is here for psychiatric evaluations. Endorses SI/HI.

## 2022-07-22 NOTE — ED Notes (Signed)
Pt shirt mesh dress and bracelets are in pt belonging [place] 1-4

## 2022-07-22 NOTE — ED Notes (Signed)
Pt refused to put belongings in belonging bags pt states "look bitch I'm gonna give it to the doctor so you dont steal it"

## 2022-07-22 NOTE — ED Triage Notes (Signed)
EMS report: PD responded to pt requesting EMS. Pt requesting psychiatric evaluation and STD screening in route. Pt states she has been bleeding from her thighs.

## 2022-07-22 NOTE — ED Notes (Signed)
Pt dressed in maroon scrubs

## 2022-07-22 NOTE — ED Notes (Signed)
Pt refused to get changed in bathroom rn attempted to provide privacy for pt by holding up blankets but pt began undressing in the hall

## 2022-07-23 DIAGNOSIS — Z113 Encounter for screening for infections with a predominantly sexual mode of transmission: Secondary | ICD-10-CM | POA: Diagnosis not present

## 2022-07-23 LAB — URINALYSIS, ROUTINE W REFLEX MICROSCOPIC
Bilirubin Urine: NEGATIVE
Glucose, UA: NEGATIVE mg/dL
Hgb urine dipstick: NEGATIVE
Ketones, ur: 5 mg/dL — AB
Nitrite: NEGATIVE
Protein, ur: NEGATIVE mg/dL
Specific Gravity, Urine: 1.014 (ref 1.005–1.030)
pH: 6 (ref 5.0–8.0)

## 2022-07-23 LAB — GC/CHLAMYDIA PROBE AMP (~~LOC~~) NOT AT ARMC
Chlamydia: NEGATIVE
Comment: NEGATIVE
Comment: NORMAL
Neisseria Gonorrhea: POSITIVE — AB

## 2022-07-23 LAB — PREGNANCY, URINE: Preg Test, Ur: NEGATIVE

## 2022-07-23 MED ORDER — LORAZEPAM 1 MG PO TABS
1.0000 mg | ORAL_TABLET | Freq: Once | ORAL | Status: AC
  Administered 2022-07-23: 1 mg via ORAL
  Filled 2022-07-23: qty 1

## 2022-07-23 NOTE — Discharge Instructions (Signed)
Return to the ED with any new or worsening signs or symptoms Please follow-up on the results of your STI testing done here tonight.  Please utilize MyChart.  If your testing is positive, please return to the ED for treatment. Please continue taking your medications as an outpatient.  Please continue utilizing the prepackaged medications that you showed me tonight. Please follow-up with your psychiatric team for further management

## 2022-07-23 NOTE — ED Notes (Signed)
Pt currently talking to herself. Is yelling out that someone is here to hurt her. Pt reassured and no longer yelling out. Pt continues to converse with herself.   Pt has been uncooperative with staff with removal of belongings.

## 2022-07-23 NOTE — ED Provider Notes (Signed)
[facility] Provider Note   CSN: [id number] Arrival date & time: 07/22/22  2301     History  Chief Complaint  Patient presents with   Psychiatric Evaluation    Molly [name] is a 37 y.o. female with medical history of paranoid schizophrenia, schizoaffective, asthma, depression, hypertension.  Patient presents to the ED for evaluation of psychiatric assessment.  Patient reports that she is here because "when I lay down to go to sleep at night, I cannot close my eyes.  I am not able to have dreams.  Also there are shadows that are following me".  The patient is alert and oriented x 3.  The patient denies SI, HI.  In triage, patient apparently did endorse SI, however on my examination the patient denies this.  Patient reports she is here requesting assistance with her medications.  The patient was seen here on 3/9 for the same complaint.  At this time, the patient was provided her home medications and discharged back to community.  Patient is also requesting STI testing.  The patient denies dysuria, vaginal discharge, fevers, dyspareunia.  The patient reports that she believes that a "shadow" is touching her at night.  The patient is unable to expand on this.  HPI     Home Medications Prior to Admission medications   Medication Sig Start Date End Date Taking? Authorizing Provider  albuterol (VENTOLIN HFA) 108 (90 Base) MCG/ACT inhaler Inhale 1 puff into the lungs every 4 (four) hours as needed for wheezing or shortness of breath.    [provider]  benztropine (COGENTIN) 1 MG tablet Take 1 mg by mouth 2 (two) times daily. Patient not taking: Reported on 06/12/2022 03/02/22   [provider]  divalproex (DEPAKOTE) 250 MG DR tablet Take 3 tablets (750 mg total) by mouth 2 (two) times daily. 07/10/22 08/09/22  Dorothyann Peng, PA-C  fluPHENAZine (PROLIXIN) 10 MG tablet Take 10 mg by mouth at bedtime. 05/25/22   [provider]   fluPHENAZine decanoate (PROLIXIN) 25 MG/ML injection Inject 25 mg into the muscle every 21 ( twenty-one) days. Due on 06-08-22    [provider]  haloperidol (HALDOL) 10 MG tablet Take 1 tablet (10 mg total) by mouth 2 (two) times daily. Patient not taking: Reported on 06/12/2022 01/29/22   Lindon Romp A, NP  lisinopril (ZESTRIL) 5 MG tablet Take 5 mg by mouth daily. 03/31/22   [provider]  lithium carbonate 300 MG capsule Take 300 mg by mouth 2 (two) times daily. 05/25/22   [provider]  OLANZapine (ZYPREXA) 10 MG tablet Take 1 tablet (10 mg total) by mouth in the morning and at bedtime. 07/10/22 08/09/22  Dorothyann Peng, PA-C  paliperidone (INVEGA SUSTENNA) 156 MG/ML SUSY injection Inject 156 mg into the muscle once. Patient not taking: Reported on 04/13/2022    [provider]      Allergies    Abilify [aripiprazole]    Review of Systems   Review of Systems  Psychiatric/Behavioral:  Negative for hallucinations, self-injury and suicidal ideas.   All other systems reviewed and are negative.   Physical Exam Updated Vital Signs BP (!) 113/90 (BP Location: Left Arm)   Pulse (!) 101   Temp 98.1 F (36.7 C) (Oral)   Resp 19   SpO2 100%  Physical Exam Vitals and nursing note reviewed.  Constitutional:      General: She is not in acute distress.    Appearance:  Normal appearance. She is not ill-appearing, toxic-appearing or diaphoretic.  HENT:     Head: Normocephalic and atraumatic.     Nose: Nose normal.     Mouth/Throat:     Mouth: Mucous membranes are moist.     Pharynx: Oropharynx is clear.  Eyes:     Extraocular Movements: Extraocular movements intact.     Conjunctiva/sclera: Conjunctivae normal.     Pupils: Pupils are equal, round, and reactive to light.  Cardiovascular:     Rate and Rhythm: Normal rate and regular rhythm.  Pulmonary:     Effort: Pulmonary effort is normal.     Breath sounds: Normal breath sounds. No wheezing.   Abdominal:     General: Abdomen is flat. Bowel sounds are normal.     Palpations: Abdomen is soft.     Tenderness: There is no abdominal tenderness.  Musculoskeletal:     Cervical back: Normal range of motion and neck supple. No tenderness.  Skin:    General: Skin is warm and dry.     Capillary Refill: Capillary refill takes less than 2 seconds.  Neurological:     Mental Status: She is alert and oriented to person, place, and time.     ED Results / Procedures / Treatments   Labs (all labs ordered are listed, but only abnormal results are displayed) Labs Reviewed  URINALYSIS, ROUTINE W REFLEX MICROSCOPIC - Abnormal; Notable for the following components:      Result Value   APPearance CLOUDY (*)    Ketones, ur 5 (*)    Leukocytes,Ua MODERATE (*)    Bacteria, UA RARE (*)    All other components within normal limits  PREGNANCY, URINE  GC/CHLAMYDIA PROBE AMP ([place]) NOT AT [facility]    EKG None  Radiology No results found.  Procedures Procedures   Medications Ordered in ED Medications  LORazepam (ATIVAN) tablet 1 mg (1 mg Oral Given 07/23/22 0127)    ED Course/ Medical Decision Making/ A&P  Medical Decision Making Amount and/or Complexity of Data Reviewed Labs: ordered.  Risk Prescription drug management.   37 year old female presents to ED for evaluation, requesting medication assistance.  Please see HPI for further details.  On examination the patient is afebrile.  Lung sounds clear bilaterally, not hypoxic.  Abdomen soft compressible throughout.  Neurological examination without focal neurodeficits, alert and oriented x 3.  Nontoxic in appearance.  Patient denies SI, HI.   Patient here requesting STI testing, requesting medication assistance.  Patient is shown to have ample medication with her.   The patient denies any symptoms of STIs, denies any recent sexual contacts.  Will collect patient urine and analyze it for gonorrhea/chlamydia.  Patient will be  advised to follow-up on the results on MyChart, if there is any positive results she will be advised to return to the ED for treatment.  Due to lack of symptoms, we will not treat at this time.  Patient urinalysis unremarkable.  Patient denies dysuria.  Patient pregnancy test is negative.  Patient was provided 1 mg of Ativan for slight agitation, patient shown to have reduced agitation at this time.  At this time, after discussion with attending Dr. Ralene Bathe, we feel that this patient is stable for discharge.  The patient will be discharged back to community and advised to follow-up with her PCP.  The patient was given return precautions and she voiced understanding.  The patient had all of her questions answered to her satisfaction.  The patient is stable for  discharge.  I attempted to contact the patient legal guardian prior to discharge however there was no answer.  Final Clinical Impression(s) / ED Diagnoses Final diagnoses:  Routine screening for STI (sexually transmitted infection)    Rx / DC Orders ED Discharge Orders     None         Lawana Chambers 07/23/22 0543    Quintella Reichert, MD 07/23/22 2310

## 2022-07-23 NOTE — ED Notes (Signed)
Pt urinated in a pt belonging bag then refused to allow rn to take it to dispose of it

## 2022-07-24 DIAGNOSIS — Z79899 Other long term (current) drug therapy: Secondary | ICD-10-CM | POA: Insufficient documentation

## 2022-07-24 DIAGNOSIS — R6883 Chills (without fever): Secondary | ICD-10-CM | POA: Diagnosis present

## 2022-07-24 DIAGNOSIS — A549 Gonococcal infection, unspecified: Secondary | ICD-10-CM | POA: Insufficient documentation

## 2022-07-24 DIAGNOSIS — J45909 Unspecified asthma, uncomplicated: Secondary | ICD-10-CM | POA: Insufficient documentation

## 2022-07-24 DIAGNOSIS — I10 Essential (primary) hypertension: Secondary | ICD-10-CM | POA: Insufficient documentation

## 2022-07-24 DIAGNOSIS — E119 Type 2 diabetes mellitus without complications: Secondary | ICD-10-CM | POA: Diagnosis not present

## 2022-07-24 DIAGNOSIS — F200 Paranoid schizophrenia: Secondary | ICD-10-CM | POA: Diagnosis not present

## 2022-07-25 ENCOUNTER — Emergency Department (HOSPITAL_COMMUNITY)
Admission: EM | Admit: 2022-07-25 | Discharge: 2022-07-25 | Disposition: A | Discharge status: Home / Self Care | Payer: 59 | Attending: Emergency Medicine | Admitting: Emergency Medicine

## 2022-07-25 ENCOUNTER — Encounter (HOSPITAL_COMMUNITY): Payer: Self-pay

## 2022-07-25 ENCOUNTER — Other Ambulatory Visit: Payer: Self-pay

## 2022-07-25 DIAGNOSIS — A549 Gonococcal infection, unspecified: Secondary | ICD-10-CM | POA: Diagnosis not present

## 2022-07-25 DIAGNOSIS — F200 Paranoid schizophrenia: Secondary | ICD-10-CM

## 2022-07-25 MED ORDER — LIDOCAINE HCL (PF) 1 % IJ SOLN
1.0000 mL | Freq: Once | INTRAMUSCULAR | Status: AC
  Administered 2022-07-25: 2.1 mL
  Filled 2022-07-25: qty 30

## 2022-07-25 MED ORDER — CEFTRIAXONE SODIUM 1 G IJ SOLR
1.0000 g | Freq: Once | INTRAMUSCULAR | Status: AC
  Administered 2022-07-25: 1 g via INTRAMUSCULAR
  Filled 2022-07-25: qty 10

## 2022-07-25 NOTE — ED Triage Notes (Signed)
Patient brought in by [facility] from [facility], patient reports she has chills her whole life and states teeth are coming out of her hands. Also states she is bleeding everywhere but no bleeding noted. Patient has various complaints and hx of schizophrenia.

## 2022-07-25 NOTE — ED Notes (Signed)
Urine and urine culture sent to the lab.

## 2022-07-25 NOTE — ED Provider Notes (Signed)
[place] [facility] Provider Note  CSN: [id number] Arrival date & time: 07/24/22 2355  Chief Complaint(s) Mental Health Problem  HPI Molly [name] is a 37 y.o. female with a past medical history listed below including schizophrenia who presents to the emergency department reporting chills for her entire life.  Patient also states that she is bleeding through her skin. SI/HI.  Patient was seen on 3/22 for STI check. UA was + for GC.  The history is provided by the patient.    Past Medical History Past Medical History:  Diagnosis Date   Abnormal Pap smear    ASC-cannot exclude HGSIL on Pap 02/15/2012   ASC-US on 02/03/2012 pap (associated Trichomonas infection). No reflex HPV testing performed on specimen.  Patient informed that she will need repeat Pap in one year.       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 Henrene Dodge (Psych) At [facility] in  [place], [place]. Qualifier: Hospitalized for  By: McDiarmid MD, [place], PARANOID, CHRONIC 11/19/2008   Qualifier: Diagnosis of  By: McDiarmid MD, Jolyn Nap USER 02/08/2009   Qualifier: Diagnosis of  By: Samara Snide     Patient Active Problem List   Diagnosis Date Noted   Hallucinations 04/14/2022   Tibia/fibula fracture, right, closed, initial encounter 10/25/2021   Involuntary commitment 08/19/2021   Hypersomnolence 10/30/2019   Schizophrenia ([place]) 10/17/2018   Post-operative state 10/07/2017    Disorganized schizophrenia ([place])    Psychoses ([place])    Borderline intellectual functioning    Elevated WBC count 04/07/2014   Noncompliance with medication regimen 04/07/2014   Schizoaffective disorder-chronic with exacerbation ([place]) 02/06/2014   Schizoaffective disorder ([place]) 01/19/2014   Aggressive behavior 08/16/2013   Papular rash, generalized 09/01/2012   Amenorrhea 04/25/2012   ASC-cannot exclude HGSIL on Pap 02/15/2012   Screening for malignant neoplasm of the cervix 02/01/2012   CONDYLOMA ACUMINATA, 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 Authorizing Provider  albuterol (VENTOLIN HFA) 108 (90 Base) MCG/ACT inhaler Inhale 1 puff into the lungs every 4 (four) hours as needed for wheezing or shortness of breath.    [provider]  benztropine (COGENTIN) 1 MG tablet Take 1 mg by mouth 2 (two) times daily. Patient not taking: Reported on 06/12/2022 03/02/22   [provider]  divalproex (DEPAKOTE) 250 MG DR tablet Take 3 tablets (750 mg total) by mouth 2 (two) times daily. 07/10/22 08/09/22  Dorothyann Peng, PA-C  fluPHENAZine (PROLIXIN) 10 MG tablet Take 10 mg by mouth at bedtime. 05/25/22   [provider]  fluPHENAZine decanoate (PROLIXIN)  25 MG/ML injection Inject 25 mg into the muscle every 21 ( twenty-one) days. Due on 06-08-22    [provider]  haloperidol (HALDOL) 10 MG tablet Take 1 tablet (10 mg total) by mouth 2 (two) times daily. Patient not taking: Reported on 06/12/2022 01/29/22   Lindon Romp A, NP  lisinopril (ZESTRIL) 5 MG tablet Take 5 mg by mouth daily. 03/31/22   [provider]  lithium carbonate 300 MG capsule Take 300 mg by  mouth 2 (two) times daily. 05/25/22   [provider]  OLANZapine (ZYPREXA) 10 MG tablet Take 1 tablet (10 mg total) by mouth in the morning and at bedtime. 07/10/22 08/09/22  Dorothyann Peng, PA-C  paliperidone (INVEGA SUSTENNA) 156 MG/ML SUSY injection Inject 156 mg into the muscle once. Patient not taking: Reported on 04/13/2022    [provider]                                                                                                                                    Allergies Abilify [aripiprazole]  Review of Systems Review of Systems As noted in HPI  Physical Exam Vital Signs  I have reviewed the triage vital signs BP (!) 135/98 (BP Location: Right Arm)   Pulse (!) 108   Temp 98.2 F (36.8 C) (Oral)   Resp 18   Ht 5\' 8"  (1.727 m)   Wt 124.7 kg   SpO2 100%   BMI 41.81 kg/m   Physical Exam Vitals reviewed.  Constitutional:      General: She is not in acute distress.    Appearance: She is well-developed. She is obese. She is not diaphoretic.  HENT:     Head: Normocephalic and atraumatic.     Right Ear: External ear normal.     Left Ear: External ear normal.     Nose: Nose normal.  Eyes:     General: No scleral icterus.    Conjunctiva/sclera: Conjunctivae normal.  Neck:     Trachea: Phonation normal.  Cardiovascular:     Rate and Rhythm: Normal rate and regular rhythm.  Pulmonary:     Effort: Pulmonary effort is normal. No respiratory distress.     Breath sounds: No stridor.  Abdominal:     General: There is no distension.  Musculoskeletal:        General: Normal range of motion.     Cervical back: Normal range of motion.  Neurological:     Mental Status: She is alert and oriented to person, place, and time.  Psychiatric:        Attention and Perception: Attention normal.        Mood and Affect: Mood normal.        Speech: Speech normal.        Behavior: Behavior is cooperative.        Thought Content: Thought content is delusional.      ED  Results and Treatments Labs (all labs ordered are listed, but only abnormal results are displayed) Labs Reviewed - No data to display                                                                                                                       EKG  EKG Interpretation  Date/Time:    Ventricular Rate:    PR Interval:    QRS Duration:   QT Interval:    QTC Calculation:   R Axis:     Text Interpretation:         Radiology No results found.  Medications Ordered in ED Medications  cefTRIAXone (ROCEPHIN) injection 1 g (1 g Intramuscular Given 07/25/22 0144)  lidocaine (PF) (XYLOCAINE) 1 % injection 1-2.1 mL (2.1 mLs Other Given 07/25/22 0144)                                                                                                                                     Procedures Procedures  (including critical care time)  Medical Decision Making / ED Course  Click here for ABCD2, HEART and other calculators  Medical Decision Making   Psych evaluation: Patient is cooperative. Not aggressive. No SI or HI. He does have delusions consistent with her long standing schizophrenia. She does not appear to be decompensated.  She does not appear to be a threat to herself or others.  Positive gonorrhea. Will give patient dose of IM Rocephin. Chlamydia was negative and does not require treatment.      Final Clinical Impression(s) / ED Diagnoses Final diagnoses:  Paranoid schizophrenia ([place])  Gonorrhea   The patient appears reasonably screened and/or stabilized for discharge and I doubt any other medical condition or other [facility] requiring further screening, evaluation, or treatment in the ED at this time. I have discussed the findings, Dx and Tx plan with the patient/family who expressed understanding and agree(s) with the plan. Discharge instructions discussed at length. The patient/family was given strict return precautions who verbalized understanding of the  instructions. No further questions at time of discharge.  Disposition: Discharge  Condition: Good  ED Discharge Orders     None         Follow Up: Primary care provider  Schedule an appointment as soon as possible for a visit             This chart was dictated using voice  recognition software.  Despite best efforts to proofread,  errors can occur which can change the documentation meaning.    Fatima Blank, MD 07/25/22 [phone]

## 2022-07-27 ENCOUNTER — Other Ambulatory Visit: Payer: Self-pay

## 2022-07-27 ENCOUNTER — Encounter (HOSPITAL_COMMUNITY): Payer: Self-pay

## 2022-07-27 ENCOUNTER — Emergency Department (HOSPITAL_COMMUNITY)
Admission: EM | Admit: 2022-07-27 | Discharge: 2022-07-28 | Disposition: A | Discharge status: Home / Self Care | Payer: 59 | Attending: Emergency Medicine | Admitting: Emergency Medicine

## 2022-07-27 DIAGNOSIS — L24A2 Irritant contact dermatitis due to fecal, urinary or dual incontinence: Secondary | ICD-10-CM | POA: Diagnosis not present

## 2022-07-27 DIAGNOSIS — R300 Dysuria: Secondary | ICD-10-CM | POA: Diagnosis present

## 2022-07-27 LAB — URINALYSIS, ROUTINE W REFLEX MICROSCOPIC
Bilirubin Urine: NEGATIVE
Glucose, UA: NEGATIVE mg/dL
Hgb urine dipstick: NEGATIVE
Ketones, ur: NEGATIVE mg/dL
Nitrite: NEGATIVE
Protein, ur: NEGATIVE mg/dL
Specific Gravity, Urine: 1.014 (ref 1.005–1.030)
pH: 6 (ref 5.0–8.0)

## 2022-07-27 NOTE — ED Provider Notes (Incomplete)
[place] EMERGENCY DEPARTMENT AT [facility] Provider Note   CSN: [id number] Arrival date & time: 07/27/22  2118     History {Add pertinent medical, surgical, social history, OB history to HPI:1} Chief Complaint  Patient presents with  . Dysuria    Molly [name] is a 37 y.o. female.  Patient with history of schizophrenia presents today with complaints of dysuria. She states that same has been ongoing for the past few days. She states that she thinks she wears her underwear too tight and it is causing skin irritation as well which also hurts when she urinates. She denies fevers or chills, abdominal pain, nausea, vomiting, diarrhea, or vaginal discharge.  The history is provided by the patient. No language interpreter was used.  Dysuria      Home Medications Prior to Admission medications   Medication Sig Start Date End Date Taking? Authorizing Provider  albuterol (VENTOLIN HFA) 108 (90 Base) MCG/ACT inhaler Inhale 1 puff into the lungs every 4 (four) hours as needed for wheezing or shortness of breath.    [provider]  benztropine (COGENTIN) 1 MG tablet Take 1 mg by mouth 2 (two) times daily. Patient not taking: Reported on 06/12/2022 03/02/22   [provider]  divalproex (DEPAKOTE) 250 MG DR tablet Take 3 tablets (750 mg total) by mouth 2 (two) times daily. 07/10/22 08/09/22  Dorothyann Peng, PA-C  fluPHENAZine (PROLIXIN) 10 MG tablet Take 10 mg by mouth at bedtime. 05/25/22   [provider]  fluPHENAZine decanoate (PROLIXIN) 25 MG/ML injection Inject 25 mg into the muscle every 21 ( twenty-one) days. Due on 06-08-22    [provider]  haloperidol (HALDOL) 10 MG tablet Take 1 tablet (10 mg total) by mouth 2 (two) times daily. Patient not taking: Reported on 06/12/2022 01/29/22   Lindon Romp A, NP  lisinopril (ZESTRIL) 5 MG tablet Take 5 mg by mouth daily. 03/31/22   [provider]  lithium carbonate 300 MG capsule Take  300 mg by mouth 2 (two) times daily. 05/25/22   [provider]  OLANZapine (ZYPREXA) 10 MG tablet Take 1 tablet (10 mg total) by mouth in the morning and at bedtime. 07/10/22 08/09/22  Dorothyann Peng, PA-C  paliperidone (INVEGA SUSTENNA) 156 MG/ML SUSY injection Inject 156 mg into the muscle once. Patient not taking: Reported on 04/13/2022    [provider]      Allergies    Abilify [aripiprazole]    Review of Systems   Review of Systems  Genitourinary:  Positive for dysuria.  All other systems reviewed and are negative.   Physical Exam Updated Vital Signs BP 130/82 (BP Location: Right Arm)   Pulse (!) 118   Temp 99.3 F (37.4 C) (Oral)   Resp 18   Ht 5\' 8"  (1.727 m)   Wt 124 kg   SpO2 99%   BMI 41.57 kg/m  Physical Exam Vitals and nursing note reviewed.  Constitutional:      General: She is not in acute distress.    Appearance: Normal appearance. She is normal weight. She is not ill-appearing, toxic-appearing or diaphoretic.  HENT:     Head: Normocephalic and atraumatic.  Cardiovascular:     Rate and Rhythm: Normal rate.  Pulmonary:     Effort: Pulmonary effort is normal. No respiratory distress.  Genitourinary:    Comments: Fould odor of urine present on exam, patient has saturated herself. She is also wearing 3 pairs of tight underwear and has  some mild skin breakdown in her bilateral inner thighs. No cellulitic changes, fluctuance, induration, crepitus or drainage. No specific lesions present.  Musculoskeletal:        General: Normal range of motion.     Cervical back: Normal range of motion.  Skin:    General: Skin is warm and dry.  Neurological:     General: No focal deficit present.     Mental Status: She is alert.  Psychiatric:        Mood and Affect: Mood normal.        Behavior: Behavior normal.     ED Results / Procedures / Treatments   Labs (all labs ordered are listed, but only abnormal results are displayed) Labs Reviewed   URINALYSIS, ROUTINE W REFLEX MICROSCOPIC    EKG None  Radiology No results found.  Procedures Procedures  {Document cardiac monitor, telemetry assessment procedure when appropriate:1}  Medications Ordered in ED Medications - No data to display  ED Course/ Medical Decision Making/ A&P   {   Click here for ABCD2, HEART and other calculatorsREFRESH Note before signing :1}                          Medical Decision Making Amount and/or Complexity of Data Reviewed Labs: ordered.   ***  {Document critical care time when appropriate:1} {Document review of labs and clinical decision tools ie heart score, Chads2Vasc2 etc:1}  {Document your independent review of radiology images, and any outside records:1} {Document your discussion with family members, caretakers, and with consultants:1} {Document social determinants of health affecting pt's care:1} {Document your decision making why or why not admission, treatments were needed:1} Final Clinical Impression(s) / ED Diagnoses Final diagnoses:  None    Rx / DC Orders ED Discharge Orders     None

## 2022-07-27 NOTE — ED Provider Notes (Signed)
[place] EMERGENCY DEPARTMENT AT [facility] Provider Note   CSN: [id number] Arrival date & time: 07/27/22  2118     History  Chief Complaint  Patient presents with   Dysuria    Molly [name] is a 37 y.o. female.  Patient with history of schizophrenia presents today with complaints of dysuria. She states that same has been ongoing for the past few days. She states that she thinks she wears her underwear too tight and it is causing skin irritation as well which also hurts when she urinates. She is also homeless and so she has difficulty maintaining good hygiene. She denies fevers or chills, abdominal pain, nausea, vomiting, diarrhea, or vaginal discharge.  The history is provided by the patient. No language interpreter was used.  Dysuria      Home Medications Prior to Admission medications   Medication Sig Start Date End Date Taking? Authorizing Provider  albuterol (VENTOLIN HFA) 108 (90 Base) MCG/ACT inhaler Inhale 1 puff into the lungs every 4 (four) hours as needed for wheezing or shortness of breath.    [provider]  benztropine (COGENTIN) 1 MG tablet Take 1 mg by mouth 2 (two) times daily. Patient not taking: Reported on 06/12/2022 03/02/22   [provider]  divalproex (DEPAKOTE) 250 MG DR tablet Take 3 tablets (750 mg total) by mouth 2 (two) times daily. 07/10/22 08/09/22  Dorothyann Peng, PA-C  fluPHENAZine (PROLIXIN) 10 MG tablet Take 10 mg by mouth at bedtime. 05/25/22   [provider]  fluPHENAZine decanoate (PROLIXIN) 25 MG/ML injection Inject 25 mg into the muscle every 21 ( twenty-one) days. Due on 06-08-22    [provider]  haloperidol (HALDOL) 10 MG tablet Take 1 tablet (10 mg total) by mouth 2 (two) times daily. Patient not taking: Reported on 06/12/2022 01/29/22   Lindon Romp A, NP  lisinopril (ZESTRIL) 5 MG tablet Take 5 mg by mouth daily. 03/31/22   [provider]  lithium carbonate 300 MG capsule Take  300 mg by mouth 2 (two) times daily. 05/25/22   [provider]  OLANZapine (ZYPREXA) 10 MG tablet Take 1 tablet (10 mg total) by mouth in the morning and at bedtime. 07/10/22 08/09/22  Dorothyann Peng, PA-C  paliperidone (INVEGA SUSTENNA) 156 MG/ML SUSY injection Inject 156 mg into the muscle once. Patient not taking: Reported on 04/13/2022    [provider]      Allergies    Abilify [aripiprazole]    Review of Systems   Review of Systems  Genitourinary:  Positive for dysuria.  All other systems reviewed and are negative.   Physical Exam Updated Vital Signs BP 130/82 (BP Location: Right Arm)   Pulse (!) 118   Temp 99.3 F (37.4 C) (Oral)   Resp 18   Ht 5\' 8"  (1.727 m)   Wt 124 kg   SpO2 99%   BMI 41.57 kg/m  Physical Exam Vitals and nursing note reviewed.  Constitutional:      General: She is not in acute distress.    Appearance: Normal appearance. She is normal weight. She is not ill-appearing, toxic-appearing or diaphoretic.  HENT:     Head: Normocephalic and atraumatic.  Cardiovascular:     Rate and Rhythm: Normal rate.  Pulmonary:     Effort: Pulmonary effort is normal. No respiratory distress.  Abdominal:     General: Abdomen is flat.     Palpations: Abdomen is soft.     Tenderness: There is  no abdominal tenderness.  Genitourinary:    Comments: [place]ould odor of urine present on exam, patient has saturated herself. She is also wearing 3 pairs of tight underwear and has some mild skin breakdown in her bilateral inner thighs. No cellulitic changes, fluctuance, induration, crepitus or drainage. No specific lesions present.  Musculoskeletal:        General: Normal range of motion.     Cervical back: Normal range of motion.  Skin:    General: Skin is warm and dry.  Neurological:     General: No focal deficit present.     Mental Status: She is alert.  Psychiatric:        Mood and Affect: Mood normal.        Behavior: Behavior normal.     ED  Results / Procedures / Treatments   Labs (all labs ordered are listed, but only abnormal results are displayed) Labs Reviewed  URINALYSIS, ROUTINE W REFLEX MICROSCOPIC - Abnormal; Notable for the following components:      Result Value   APPearance CLOUDY (*)    Leukocytes,Ua SMALL (*)    Bacteria, UA FEW (*)    All other components within normal limits    EKG None  Radiology No results found.  Procedures Procedures    Medications Ordered in ED Medications - No data to display  ED Course/ Medical Decision Making/ A&P                             Medical Decision Making Amount and/or Complexity of Data Reviewed Labs: ordered.   Patient presents today with skin irritation in her genital region and pain with urination. She is afebrile, non-toxic appearing, and in no acute distress with reassuring vital signs. Physical exam reveals mild skin irritation in the bilateral upper thighs likely due to poor hygiene. She is also wearing 3 pairs of underwear with tight leggings over them all of which are saturated. Wounds are noninfectious appearing without purulence, fluctuance, induration, or crepitus. She denies incontinence. UA noninfectious. No indication for antibiotics. Recommend better hygiene with loose fitting clothing to allow for skin irritation to resolve. Evaluation and diagnostic testing in the emergency department does not suggest an emergent condition requiring admission or immediate intervention beyond what has been performed at this time.  Plan for discharge with close PCP follow-up.  Patient is understanding and amenable with plan, educated on red flag symptoms that would prompt immediate return.  Patient discharged in stable condition.   Final Clinical Impression(s) / ED Diagnoses Final diagnoses:  Urine induced contact dermatitis    Rx / DC Orders ED Discharge Orders     None     An After Visit Summary was printed and given to the patient.     Nestor Lewandowsky 07/28/22 [id number]    Palumbo, April, MD 07/28/22 [id number]

## 2022-07-27 NOTE — ED Triage Notes (Signed)
Patient brought in by [facility], reports burning with urination and "bumps" on her buttocks. Has various complaints, hx of schizophrenia and being off meds.

## 2022-07-28 NOTE — Discharge Instructions (Signed)
As we discussed, your work-up in the ER was reassuring for acute findings. Laboratory evaluation of your urine sample did not show signs of infection. I suspect your skin irritation is due to wearing tight fitting clothing and poor hygiene. I recommend wearing looser fitting clothing and keeping yourself dry as much as possible to ensure that these areas heal.  Return if development of any new or worsening symptoms.

## 2022-07-28 NOTE — ED Notes (Signed)
Patient given discharge instructions and follow up care. Patient verbalized understanding. Patient ambulatory out of ED. Provided with bus pass per request.

## 2022-11-27 ENCOUNTER — Encounter (HOSPITAL_COMMUNITY): Payer: Self-pay

## 2022-11-27 ENCOUNTER — Other Ambulatory Visit: Payer: Self-pay

## 2022-11-27 ENCOUNTER — Emergency Department (HOSPITAL_COMMUNITY)
Admission: EM | Admit: 2022-11-27 | Discharge: 2022-11-27 | Discharge status: Against Medical Advice | Payer: 59 | Attending: Emergency Medicine | Admitting: Emergency Medicine

## 2022-11-27 DIAGNOSIS — Z5321 Procedure and treatment not carried out due to patient leaving prior to being seen by health care provider: Secondary | ICD-10-CM | POA: Diagnosis not present

## 2022-11-27 DIAGNOSIS — F17210 Nicotine dependence, cigarettes, uncomplicated: Secondary | ICD-10-CM | POA: Insufficient documentation

## 2022-11-27 DIAGNOSIS — Z113 Encounter for screening for infections with a predominantly sexual mode of transmission: Secondary | ICD-10-CM | POA: Insufficient documentation

## 2022-11-27 NOTE — ED Notes (Signed)
Security at bedside

## 2022-11-27 NOTE — ED Notes (Signed)
Pt refused vitals 

## 2022-11-27 NOTE — ED Notes (Signed)
patient became very agitated, yelling and cursing. Security was called to her room. The patient then informed security that she was leaving-stated "yall took too long". She could not be convinced to stay

## 2022-11-27 NOTE — ED Notes (Signed)
Arrive to room to triage patient. Rooms smells like cigarettes. Refusing vitals. Refusing care. Pt uncooperative. Refusing to answer questions. States she wants a female Engineer, civil (consulting).

## 2022-11-27 NOTE — ED Notes (Signed)
Pt smoking in room, pt is demanding a female nurse and doctor.

## 2022-11-27 NOTE — ED Notes (Signed)
Pt keeps yelling out and trying to go into another pts. Room.
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
## 2022-12-12 ENCOUNTER — Other Ambulatory Visit: Payer: Self-pay

## 2022-12-12 ENCOUNTER — Emergency Department (HOSPITAL_COMMUNITY)
Admission: EM | Admit: 2022-12-12 | Discharge: 2022-12-12 | Discharge status: Against Medical Advice | Payer: 59 | Attending: Emergency Medicine | Admitting: Emergency Medicine

## 2022-12-12 DIAGNOSIS — M25569 Pain in unspecified knee: Secondary | ICD-10-CM | POA: Diagnosis not present

## 2022-12-12 DIAGNOSIS — W19XXXA Unspecified fall, initial encounter: Secondary | ICD-10-CM | POA: Insufficient documentation

## 2022-12-12 DIAGNOSIS — R519 Headache, unspecified: Secondary | ICD-10-CM | POA: Diagnosis present

## 2022-12-12 DIAGNOSIS — Z5321 Procedure and treatment not carried out due to patient leaving prior to being seen by health care provider: Secondary | ICD-10-CM | POA: Diagnosis not present

## 2022-12-12 NOTE — ED Notes (Signed)
Patient walked out of department. She screamed that she needs water now and is having an emergency and needs the doctor now. Patient was told to please go into the room and the doctor will be in soon. Patient left department.

## 2022-12-12 NOTE — ED Triage Notes (Signed)
Pt BIBA from house she was trespassing in. Pt has mult complaints, main concerns when being triaged was something to eat and drink.   AOx4

## 2022-12-20 ENCOUNTER — Ambulatory Visit (HOSPITAL_COMMUNITY)
Admission: EM | Admit: 2022-12-20 | Discharge: 2022-12-20 | Disposition: A | Discharge status: Home / Self Care | Payer: 59 | Attending: Psychiatry | Admitting: Psychiatry

## 2022-12-20 ENCOUNTER — Encounter (HOSPITAL_COMMUNITY): Payer: Self-pay | Admitting: Registered Nurse

## 2022-12-20 DIAGNOSIS — F17200 Nicotine dependence, unspecified, uncomplicated: Secondary | ICD-10-CM | POA: Diagnosis not present

## 2022-12-20 DIAGNOSIS — Z5900 Homelessness unspecified: Secondary | ICD-10-CM | POA: Insufficient documentation

## 2022-12-20 DIAGNOSIS — F39 Unspecified mood [affective] disorder: Secondary | ICD-10-CM | POA: Diagnosis present

## 2022-12-20 DIAGNOSIS — F200 Paranoid schizophrenia: Secondary | ICD-10-CM | POA: Insufficient documentation

## 2022-12-20 DIAGNOSIS — F12988 Cannabis use, unspecified with other cannabis-induced disorder: Secondary | ICD-10-CM

## 2022-12-20 NOTE — BH Assessment (Signed)
TTS spoke with [employer] housing specialist who reported that patient receives daily visits from the ACT team. Pt received fluphenazine 150mg  on the 12th and she receives it every 3 weeks. Housing specialist stated that patient does not have housing yet but they are working on it. ACT will try to pick pt up at 11am.

## 2022-12-20 NOTE — Progress Notes (Addendum)
   12/20/22 0828  [facility] Triage Screening (Walk-ins at [facility] only)  How Did You Hear About [place]? Legal System  What Is the Reason for Your Visit/Call Today? Pt presents to [facility] voluntarily escorted by [employer]. Per [employer] the pt walked into a business downtown asking for help, the business called the police concerned. Pt was found with no underwear and on her menstrual cycle, no shoes, face paint and only wearing a dress. Pt offered sanitary items and underwear pt refused. Pt is agitated and stating she would like to be transported to [facility]. Pt reports using drugs and alcohol but does not want to elaborate on what she has used , she states "my kind of drugs". Pt reports she is diagnosed with Schizophrenia and is not taking medication. Pt reports auditory and visual hallucinations, but does not want to elaborate. Pt reports passive SI. Pt denies HI and AVH currently  How Long Has This Been Causing You Problems? > than 6 months  Have You Recently Had Any Thoughts About Hurting Yourself? Yes  How long ago did you have thoughts about hurting yourself? today  Are You Planning to Commit Suicide/Harm Yourself At This time? No  Have you Recently Had Thoughts About Hurting Someone [name]? No  Are You Planning To Harm Someone At This Time? No  Are you currently experiencing any auditory, visual or other hallucinations? No  Have You Used Any Alcohol or Drugs in the Past 24 Hours? Yes  How long ago did you use Drugs or Alcohol? reports she has used something yesterday  What Did You Use and How Much? "my kind of drugs",  Do you have any current medical co-morbidities that require immediate attention? No  Clinician description of patient physical appearance/behavior: pt is on her menstrual cycle, refusing shower, underwear or sanitary items. Pt agitated, unkempt  What Do You Feel Would Help You the Most Today? Medication(s)  If access to [facility] was not available, would you have sought care in the Emergency  Department? No  Determination of Need Emergent (2 hours)  Options For Referral Inpatient Hospitalization
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
## 2022-12-20 NOTE — Discharge Instructions (Addendum)

## 2022-12-25 ENCOUNTER — Emergency Department (HOSPITAL_COMMUNITY)
Admission: EM | Admit: 2022-12-25 | Discharge: 2022-12-25 | Disposition: A | Discharge status: Home / Self Care | Payer: 59 | Attending: Emergency Medicine | Admitting: Emergency Medicine

## 2022-12-25 ENCOUNTER — Other Ambulatory Visit: Payer: Self-pay

## 2022-12-25 ENCOUNTER — Encounter (HOSPITAL_COMMUNITY): Payer: Self-pay

## 2022-12-25 DIAGNOSIS — Z202 Contact with and (suspected) exposure to infections with a predominantly sexual mode of transmission: Secondary | ICD-10-CM | POA: Diagnosis present

## 2022-12-25 DIAGNOSIS — N390 Urinary tract infection, site not specified: Secondary | ICD-10-CM | POA: Diagnosis not present

## 2022-12-25 DIAGNOSIS — U071 COVID-19: Secondary | ICD-10-CM | POA: Diagnosis not present

## 2022-12-25 DIAGNOSIS — E119 Type 2 diabetes mellitus without complications: Secondary | ICD-10-CM | POA: Insufficient documentation

## 2022-12-25 DIAGNOSIS — I10 Essential (primary) hypertension: Secondary | ICD-10-CM | POA: Diagnosis not present

## 2022-12-25 DIAGNOSIS — Z79899 Other long term (current) drug therapy: Secondary | ICD-10-CM | POA: Insufficient documentation

## 2022-12-25 LAB — CBC WITH DIFFERENTIAL/PLATELET
Abs Immature Granulocytes: 0.15 10*3/uL — ABNORMAL HIGH (ref 0.00–0.07)
Basophils Absolute: 0.1 10*3/uL (ref 0.0–0.1)
Basophils Relative: 0 %
Eosinophils Absolute: 0.2 10*3/uL (ref 0.0–0.5)
Eosinophils Relative: 1 %
HCT: 35.6 % — ABNORMAL LOW (ref 36.0–46.0)
Hemoglobin: 11.1 g/dL — ABNORMAL LOW (ref 12.0–15.0)
Immature Granulocytes: 1 %
Lymphocytes Relative: 12 %
Lymphs Abs: 1.7 10*3/uL (ref 0.7–4.0)
MCH: 28.5 pg (ref 26.0–34.0)
MCHC: 31.2 g/dL (ref 30.0–36.0)
MCV: 91.3 fL (ref 80.0–100.0)
Monocytes Absolute: 1.4 10*3/uL — ABNORMAL HIGH (ref 0.1–1.0)
Monocytes Relative: 10 %
Neutro Abs: 11.2 10*3/uL — ABNORMAL HIGH (ref 1.7–7.7)
Neutrophils Relative %: 76 %
Platelets: 492 10*3/uL — ABNORMAL HIGH (ref 150–400)
RBC: 3.9 MIL/uL (ref 3.87–5.11)
RDW: 14.3 % (ref 11.5–15.5)
WBC: 14.8 10*3/uL — ABNORMAL HIGH (ref 4.0–10.5)
nRBC: 0 % (ref 0.0–0.2)

## 2022-12-25 LAB — URINALYSIS, ROUTINE W REFLEX MICROSCOPIC
Glucose, UA: NEGATIVE mg/dL
Hgb urine dipstick: NEGATIVE
Ketones, ur: 5 mg/dL — AB
Nitrite: NEGATIVE
Protein, ur: 100 mg/dL — AB
Specific Gravity, Urine: 1.025 (ref 1.005–1.030)
Squamous Epithelial / HPF: >50 /HPF (ref 0–5)
WBC, UA: >50 WBC/hpf (ref 0–5)
pH: 5 (ref 5.0–8.0)

## 2022-12-25 LAB — COMPREHENSIVE METABOLIC PANEL
ALT: 9 U/L (ref 0–44)
AST: 13 U/L — ABNORMAL LOW (ref 15–41)
Albumin: 3.3 g/dL — ABNORMAL LOW (ref 3.5–5.0)
Alkaline Phosphatase: 75 U/L (ref 38–126)
Anion gap: 9 (ref 5–15)
BUN: 7 mg/dL (ref 6–20)
CO2: 23 mmol/L (ref 22–32)
Calcium: 8.5 mg/dL — ABNORMAL LOW (ref 8.9–10.3)
Chloride: 102 mmol/L (ref 98–111)
Creatinine, Ser: 0.85 mg/dL (ref 0.44–1.00)
GFR, Estimated: >60 mL/min (ref >60)
Glucose, Bld: 80 mg/dL (ref 70–99)
Potassium: 4.4 mmol/L (ref 3.5–5.1)
Sodium: 134 mmol/L — ABNORMAL LOW (ref 135–145)
Total Bilirubin: 0.6 mg/dL (ref 0.3–1.2)
Total Protein: 7.5 g/dL (ref 6.5–8.1)

## 2022-12-25 LAB — ETHANOL: Alcohol, Ethyl (B): <10 mg/dL (ref <10)

## 2022-12-25 LAB — SARS CORONAVIRUS 2 BY RT PCR: SARS Coronavirus 2 by RT PCR: POSITIVE — AB

## 2022-12-25 LAB — PREGNANCY, URINE: Preg Test, Ur: NEGATIVE

## 2022-12-25 MED ORDER — FOSFOMYCIN TROMETHAMINE 3 G PO PACK
3.0000 g | PACK | Freq: Once | ORAL | Status: AC
  Administered 2022-12-25: 3 g via ORAL
  Filled 2022-12-25: qty 3

## 2022-12-25 NOTE — Discharge Instructions (Signed)
Be sure to schedule follow-up with your physician.
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
## 2022-12-25 NOTE — ED Provider Notes (Signed)
EMERGENCY DEPARTMENT AT [facility] Provider Note   CSN: [id number] Arrival date & time: 12/25/22  1127     History  Chief Complaint  Patient presents with   Exposure to STD    Molly [name] is a 37 y.o. female.  HPI Patient with multiple medical issues including schizophrenia, diabetes, obesity, hypertension presents with multiple complaints.  Essentially with any question the patient answers in affirmative.  Seeming the patient is most concerned about being pregnant. Patient perseverates on positive review of systems answers to all questions, may have more abdominal pain than anything else, but again this is unclear.     Home Medications Prior to Admission medications   Medication Sig Start Date End Date Taking? Authorizing Provider  albuterol (VENTOLIN HFA) 108 (90 Base) MCG/ACT inhaler Inhale 1 puff into the lungs every 4 (four) hours as needed for wheezing or shortness of breath.    [provider]  divalproex (DEPAKOTE ER) 500 MG 24 hr tablet Take 1,000 mg by mouth at bedtime. 12/17/22   [provider]  fluPHENAZine (PROLIXIN) 10 MG tablet Take 10 mg by mouth at bedtime. 05/25/22   [provider]  fluPHENAZine decanoate (PROLIXIN) 25 MG/ML injection Inject 25 mg into the muscle every 21 ( twenty-one) days.    [provider]  lisinopril (ZESTRIL) 5 MG tablet Take 5 mg by mouth daily. Patient not taking: Reported on 12/20/2022 03/31/22   [provider]  lithium carbonate (LITHOBID) 300 MG ER tablet Take 300 mg by mouth 2 (two) times daily. 12/17/22   [provider]  OLANZapine (ZYPREXA) 10 MG tablet Take 1 tablet (10 mg total) by mouth in the morning and at bedtime. 07/10/22 12/20/22  Darrick Grinder, PA-C      Allergies    Abilify [aripiprazole]    Review of Systems   Review of Systems  Unable to perform ROS: Psychiatric disorder    Physical Exam Updated Vital Signs BP (!) 155/130 (BP  Location: Right Arm)   Pulse (!) 123   Temp (!) 100.4 F (38 C) (Oral)   Resp (!) 21   SpO2 100%  Physical Exam Vitals and nursing note reviewed.  Constitutional:      General: Molly [name] is not in acute distress.    Appearance: Molly [name] is well-developed. Molly [name] is obese.  HENT:     Head: Normocephalic and atraumatic.  Eyes:     Conjunctiva/sclera: Conjunctivae normal.  Cardiovascular:     Rate and Rhythm: Normal rate and regular rhythm.  Pulmonary:     Effort: Pulmonary effort is normal. No respiratory distress.     Breath sounds: Normal breath sounds. No stridor.  Abdominal:     General: There is no distension.    Skin:    General: Skin is warm and dry.  Neurological:     Mental Status: Molly [name] is alert and oriented to person, place, and time.     Cranial Nerves: No cranial nerve deficit.  Psychiatric:     Comments: Perseverating on being uncomfortable, labile mood, anxious     ED Results / Procedures / Treatments   Labs (all labs ordered are listed, but only abnormal results are displayed) Labs Reviewed  URINALYSIS, ROUTINE W REFLEX MICROSCOPIC - Abnormal; Notable for the following components:      Result Value   Color, Urine AMBER (*)    APPearance CLOUDY (*)    Bilirubin Urine SMALL (*)    Ketones, ur 5 (*)  Protein, ur 100 (*)    Leukocytes,Ua MODERATE (*)    Bacteria, UA RARE (*)    Non Squamous Epithelial 0-5 (*)    All other components within normal limits  SARS CORONAVIRUS 2 BY RT PCR  PREGNANCY, URINE  COMPREHENSIVE METABOLIC PANEL  ETHANOL  CBC WITH DIFFERENTIAL/PLATELET    EKG None  Radiology No results found.  Procedures Procedures    Medications Ordered in ED Medications - No data to display  ED Course/ Medical Decision Making/ A&P                                 Medical Decision Making Adult female with multiple medical issues including schizophrenia, diabetes, hypertension presents with multiple complaints.  Patient's physical exam is  reassuring, no rash, no tenderness of abdomen, no increased work of breathing, no hypoxia and Molly [name] moves all extremities both spontaneously and to command.  Patient is known to have schizophrenia and her psychiatric status similar to prior eval.  Patient is mildly febrile here, and given that consideration of medication effects, COVID, UTI. Pulse ox 100% room air normal   Amount and/or Complexity of Data Reviewed External Data Reviewed: notes. Labs: ordered. Decision-making details documented in ED Course.  Risk Prescription drug management. Decision regarding hospitalization. Diagnosis or treatment significantly limited by social determinants of health.   1:58 PM Patient awake, alert, in no distress sitting upright, ready to go.  Labs reviewed, unremarkable aside from possible urinary tract infection.  Patient has received fosfomycin and with no evidence for bacteremia, sepsis, distress, patient discharged in stable condition.        Final Clinical Impression(s) / ED Diagnoses Final diagnoses:  Lower urinary tract infection    Rx / DC Orders ED Discharge Orders     None         Gerhard Munch, MD 12/25/22 1359

## 2022-12-27 ENCOUNTER — Emergency Department (HOSPITAL_COMMUNITY)
Admission: EM | Admit: 2022-12-27 | Discharge: 2022-12-27 | Disposition: A | Discharge status: Home / Self Care | Payer: 59 | Attending: Emergency Medicine | Admitting: Emergency Medicine

## 2022-12-27 ENCOUNTER — Emergency Department (HOSPITAL_COMMUNITY): Payer: 59

## 2022-12-27 ENCOUNTER — Encounter (HOSPITAL_COMMUNITY): Payer: Self-pay

## 2022-12-27 DIAGNOSIS — F17210 Nicotine dependence, cigarettes, uncomplicated: Secondary | ICD-10-CM | POA: Diagnosis not present

## 2022-12-27 DIAGNOSIS — Y9302 Activity, running: Secondary | ICD-10-CM | POA: Insufficient documentation

## 2022-12-27 DIAGNOSIS — M25562 Pain in left knee: Secondary | ICD-10-CM | POA: Insufficient documentation

## 2022-12-27 DIAGNOSIS — G8911 Acute pain due to trauma: Secondary | ICD-10-CM | POA: Insufficient documentation

## 2022-12-27 DIAGNOSIS — M25561 Pain in right knee: Secondary | ICD-10-CM | POA: Diagnosis not present

## 2022-12-27 DIAGNOSIS — M79671 Pain in right foot: Secondary | ICD-10-CM | POA: Diagnosis present

## 2022-12-27 DIAGNOSIS — M79672 Pain in left foot: Secondary | ICD-10-CM | POA: Diagnosis not present

## 2022-12-27 DIAGNOSIS — J45909 Unspecified asthma, uncomplicated: Secondary | ICD-10-CM | POA: Insufficient documentation

## 2022-12-27 DIAGNOSIS — I10 Essential (primary) hypertension: Secondary | ICD-10-CM | POA: Insufficient documentation

## 2022-12-27 DIAGNOSIS — E119 Type 2 diabetes mellitus without complications: Secondary | ICD-10-CM | POA: Insufficient documentation

## 2022-12-27 NOTE — Discharge Instructions (Addendum)
You were evaluated in the Emergency Department and after careful evaluation, we did not find any emergent condition requiring admission or further testing in the hospital.  Your exam/testing today is overall reassuring.  X-ray is normal.  Recommend Tylenol and Motrin for any lingering discomfort.  Please return to the Emergency Department if you experience any worsening of your condition.   Thank you for allowing [place] to be a part of your care.
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
## 2022-12-28 ENCOUNTER — Emergency Department (HOSPITAL_COMMUNITY)
Admission: EM | Admit: 2022-12-28 | Discharge: 2022-12-29 | Disposition: A | Discharge status: Home / Self Care | Payer: 59 | Attending: Emergency Medicine | Admitting: Emergency Medicine

## 2022-12-28 ENCOUNTER — Other Ambulatory Visit: Payer: Self-pay

## 2022-12-28 DIAGNOSIS — Z113 Encounter for screening for infections with a predominantly sexual mode of transmission: Secondary | ICD-10-CM

## 2022-12-28 DIAGNOSIS — Z79899 Other long term (current) drug therapy: Secondary | ICD-10-CM | POA: Diagnosis not present

## 2022-12-28 DIAGNOSIS — E119 Type 2 diabetes mellitus without complications: Secondary | ICD-10-CM | POA: Insufficient documentation

## 2022-12-28 DIAGNOSIS — A64 Unspecified sexually transmitted disease: Secondary | ICD-10-CM | POA: Diagnosis present
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
## 2022-12-29 DIAGNOSIS — A64 Unspecified sexually transmitted disease: Secondary | ICD-10-CM | POA: Diagnosis not present

## 2022-12-29 LAB — URINALYSIS, ROUTINE W REFLEX MICROSCOPIC
Bacteria, UA: NONE SEEN
Bilirubin Urine: NEGATIVE
Glucose, UA: NEGATIVE mg/dL
Hgb urine dipstick: NEGATIVE
Ketones, ur: NEGATIVE mg/dL
Nitrite: NEGATIVE
Protein, ur: NEGATIVE mg/dL
Specific Gravity, Urine: 1.003 — ABNORMAL LOW (ref 1.005–1.030)
pH: 6 (ref 5.0–8.0)

## 2022-12-29 LAB — GC/CHLAMYDIA PROBE AMP (~~LOC~~) NOT AT ARMC
Chlamydia: NEGATIVE
Comment: NEGATIVE
Comment: NORMAL
Neisseria Gonorrhea: NEGATIVE

## 2022-12-29 LAB — RAPID HIV SCREEN (HIV 1/2 AB+AG)
HIV 1/2 Antibodies: NONREACTIVE
HIV-1 P24 Antigen - HIV24: NONREACTIVE

## 2022-12-29 LAB — RPR: RPR Ser Ql: NONREACTIVE

## 2022-12-29 LAB — PREGNANCY, URINE: Preg Test, Ur: NEGATIVE

## 2022-12-29 NOTE — ED Notes (Signed)
Patient ambulated to bathroom.

## 2022-12-29 NOTE — ED Notes (Signed)
Patient refused d/c vital signs. Escorted by security out.

## 2022-12-29 NOTE — Discharge Instructions (Addendum)
Your HIV testing was negative today. Your other STD tests will come back tomorrow, they will call you if results abnormal. Follow-up with your primary care doctor. Return here for new concerns.

## 2022-12-29 NOTE — ED Notes (Signed)
Legal guardian contacted and stated they would call back.

## 2022-12-29 NOTE — ED Notes (Signed)
Patient smoked cigarette in bathroom. Bathroom cloudy and smells like cigarettes.
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
## 2022-12-31 ENCOUNTER — Other Ambulatory Visit: Payer: Self-pay

## 2022-12-31 ENCOUNTER — Encounter (HOSPITAL_COMMUNITY): Payer: Self-pay

## 2022-12-31 ENCOUNTER — Emergency Department (HOSPITAL_COMMUNITY)
Admission: EM | Admit: 2022-12-31 | Discharge: 2023-01-01 | Disposition: A | Discharge status: Home / Self Care | Payer: 59 | Attending: Emergency Medicine | Admitting: Emergency Medicine

## 2022-12-31 DIAGNOSIS — Z79899 Other long term (current) drug therapy: Secondary | ICD-10-CM | POA: Insufficient documentation

## 2022-12-31 DIAGNOSIS — L02214 Cutaneous abscess of groin: Secondary | ICD-10-CM | POA: Insufficient documentation

## 2022-12-31 DIAGNOSIS — E119 Type 2 diabetes mellitus without complications: Secondary | ICD-10-CM | POA: Diagnosis not present

## 2022-12-31 DIAGNOSIS — L0291 Cutaneous abscess, unspecified: Secondary | ICD-10-CM

## 2022-12-31 DIAGNOSIS — J45909 Unspecified asthma, uncomplicated: Secondary | ICD-10-CM | POA: Insufficient documentation

## 2022-12-31 DIAGNOSIS — I10 Essential (primary) hypertension: Secondary | ICD-10-CM | POA: Insufficient documentation

## 2022-12-31 DIAGNOSIS — R1030 Lower abdominal pain, unspecified: Secondary | ICD-10-CM | POA: Diagnosis present
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
## 2023-01-01 MED ORDER — SULFAMETHOXAZOLE-TRIMETHOPRIM 800-160 MG PO TABS: 1.0000 | ORAL_TABLET | Freq: Two times a day (BID) | ORAL | 0 refills | Status: AC
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
## 2023-01-01 NOTE — ED Notes (Addendum)
Pt observed in hallway attempting to leave ED, attempt made to verbally redirect patient to her room #8. Pt reports to this nurse, I'm trying to find my doctor he didn't give me my discharge papers. Pt informed this nurse to bring discharge paperwork to her in her room, pt continued walking out of the ER exit and states "tell my doctor he can bring my papers to the lobby."

## 2023-01-02 ENCOUNTER — Encounter (HOSPITAL_COMMUNITY): Payer: Self-pay

## 2023-01-02 ENCOUNTER — Emergency Department (HOSPITAL_COMMUNITY)
Admission: EM | Admit: 2023-01-02 | Discharge: 2023-01-03 | Disposition: A | Discharge status: Home / Self Care | Payer: 59 | Attending: Emergency Medicine | Admitting: Emergency Medicine

## 2023-01-02 DIAGNOSIS — M79671 Pain in right foot: Secondary | ICD-10-CM | POA: Insufficient documentation

## 2023-01-02 DIAGNOSIS — F17210 Nicotine dependence, cigarettes, uncomplicated: Secondary | ICD-10-CM | POA: Insufficient documentation

## 2023-01-02 DIAGNOSIS — I10 Essential (primary) hypertension: Secondary | ICD-10-CM | POA: Insufficient documentation

## 2023-01-02 DIAGNOSIS — J45909 Unspecified asthma, uncomplicated: Secondary | ICD-10-CM | POA: Diagnosis not present

## 2023-01-02 DIAGNOSIS — E119 Type 2 diabetes mellitus without complications: Secondary | ICD-10-CM | POA: Diagnosis not present

## 2023-01-02 DIAGNOSIS — F209 Schizophrenia, unspecified: Secondary | ICD-10-CM | POA: Insufficient documentation

## 2023-01-02 DIAGNOSIS — R443 Hallucinations, unspecified: Secondary | ICD-10-CM | POA: Diagnosis present

## 2023-01-02 DIAGNOSIS — M79672 Pain in left foot: Secondary | ICD-10-CM | POA: Insufficient documentation

## 2023-01-02 NOTE — ED Triage Notes (Addendum)
Pt states that she has had a fever of 120 today or 106 and she just dont feel good, then she starts making hypersexual comments in triage and states her feet hurt. Pt does not know her main complaint of the day today.

## 2023-01-03 ENCOUNTER — Emergency Department (HOSPITAL_COMMUNITY)
Admission: EM | Admit: 2023-01-03 | Discharge: 2023-01-03 | Disposition: A | Discharge status: Home / Self Care | Payer: 59 | Source: Home / Self Care | Attending: Emergency Medicine | Admitting: Emergency Medicine

## 2023-01-03 ENCOUNTER — Encounter (HOSPITAL_COMMUNITY): Payer: Self-pay

## 2023-01-03 DIAGNOSIS — M79672 Pain in left foot: Secondary | ICD-10-CM | POA: Diagnosis not present

## 2023-01-03 DIAGNOSIS — I10 Essential (primary) hypertension: Secondary | ICD-10-CM | POA: Insufficient documentation

## 2023-01-03 DIAGNOSIS — F17210 Nicotine dependence, cigarettes, uncomplicated: Secondary | ICD-10-CM | POA: Insufficient documentation

## 2023-01-03 DIAGNOSIS — J45909 Unspecified asthma, uncomplicated: Secondary | ICD-10-CM | POA: Insufficient documentation

## 2023-01-03 DIAGNOSIS — E119 Type 2 diabetes mellitus without complications: Secondary | ICD-10-CM | POA: Insufficient documentation

## 2023-01-03 DIAGNOSIS — F209 Schizophrenia, unspecified: Secondary | ICD-10-CM | POA: Insufficient documentation

## 2023-01-03 MED ORDER — IBUPROFEN 800 MG PO TABS
800.0000 mg | ORAL_TABLET | Freq: Once | ORAL | Status: AC
  Administered 2023-01-03: 800 mg via ORAL
  Filled 2023-01-03: qty 1

## 2023-01-03 NOTE — ED Provider Notes (Signed)
[place] EMERGENCY DEPARTMENT AT [facility] Provider Note   CSN: [id number] Arrival date & time: 01/02/23  2125     History  Chief Complaint  Patient presents with   Foot Pain    Molly [name] is a 37 y.o. female.  The history is provided by the patient and medical records.  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  States her feet are hurting.  States they feel "hard".  She then begins asking numerous questions "do I need a pedicure? Do you need to remove my toenails"?  She has not taken any meds PTA.  Home Medications Prior to Admission medications   Medication Sig Start Date End Date Taking? Authorizing Provider  albuterol (VENTOLIN HFA) 108 (90 Base) MCG/ACT inhaler Inhale 1 puff into the lungs every 4 (four) hours as needed for wheezing or shortness of breath.    [provider]  divalproex (DEPAKOTE ER) 500 MG 24 hr tablet Take 1,000 mg by mouth at bedtime. 12/17/22   [provider]  fluPHENAZine (PROLIXIN) 10 MG tablet Take 10 mg by mouth at bedtime. 05/25/22   [provider]  fluPHENAZine decanoate (PROLIXIN) 25 MG/ML injection Inject 25 mg into the muscle every 21 ( twenty-one) days.    [provider]  lisinopril (ZESTRIL) 5 MG tablet Take 5 mg by mouth daily. Patient not taking: Reported on 12/20/2022 03/31/22   [provider]  lithium carbonate (LITHOBID) 300 MG ER tablet Take 300 mg by mouth 2 (two) times daily. 12/17/22   [provider]  OLANZapine (ZYPREXA) 10 MG tablet Take 1 tablet (10 mg total) by mouth in the morning and at bedtime. 07/10/22 12/20/22  Darrick Grinder, PA-C  sulfamethoxazole-trimethoprim (BACTRIM DS) 800-160 MG tablet Take 1 tablet by mouth 2 (two) times daily for 7 days. 01/01/23 01/08/23  Nira Conn, MD      Allergies    Abilify [aripiprazole]    Review of Systems   Review of  Systems  Musculoskeletal:  Positive for arthralgias.  All other systems reviewed and are negative.   Physical Exam Updated Vital Signs BP (!) 167/110 (BP Location: Right Arm)   Pulse (!) 107   Temp 97.9 F (36.6 C) (Oral)   Resp 20   LMP 12/02/2022 (Approximate)   SpO2 100%   Physical Exam Vitals and nursing note reviewed.  Constitutional:      Appearance: She is well-developed.  HENT:     Head: Normocephalic and atraumatic.  Eyes:     Conjunctiva/sclera: Conjunctivae normal.     Pupils: Pupils are equal, round, and reactive to light.  Cardiovascular:     Rate and Rhythm: Normal rate and regular rhythm.     Heart sounds: Normal heart sounds.  Pulmonary:     Effort: Pulmonary effort is normal.     Breath sounds: Normal breath sounds.  Abdominal:     General: Bowel sounds are normal.     Palpations: Abdomen is soft.  Musculoskeletal:        General: Normal range of motion.     Cervical back: Normal range of motion.     Comments: Skin on soles of feet are very dry and cracked, toes are irregularly cut and poorly polished (appears she did this today as nail polish smeared all over her foot), no skin breakdown noted between toes, DP pulses intact, moving toes as normal, ambulatory  Skin:    General: Skin is warm and dry.  Neurological:     Mental Status: She is alert and oriented to person, place, and time.     ED Results / Procedures / Treatments   Labs (all labs ordered are listed, but only abnormal results are displayed) Labs Reviewed - No data to display  EKG None  Radiology No results found.  Procedures Procedures    Medications Ordered in ED Medications - No data to display  ED Course/ Medical Decision Making/ A&P                                 Medical Decision Making  37 year old female who with bilateral foot pain.  States her feet feel "hard".  Both feet have very dry and cracked skin along the soles.  There is no open wounds or ulcerations  noted.  No skin breakdown.  No acute bony findings.  Patient begins asking vague questions during exam "do I need a pedicure? Do you need to remove my toenails?"  I have recommended she try lotion or similar to help with skin, she states she doesn't like lotion and wants to keep her feet "rough".  Patient begins speaking in various other topics, typical of her prior behaviors and consistent with her known schizophrenia.  She does not require acute intervention.  Stable for discharge.  Can return here for new concerns.  Final Clinical Impression(s) / ED Diagnoses Final diagnoses:  Pain in both feet    Rx / DC Orders ED Discharge Orders     None         Garlon Hatchet, PA-C 01/03/23 0051    Sabas Sous, MD 01/03/23 [phone]

## 2023-01-03 NOTE — ED Triage Notes (Signed)
Pt reports that she has ants in her skin, or a roach. No bugs noted.

## 2023-01-03 NOTE — Discharge Instructions (Signed)
You were evaluated in the Emergency Department and after careful evaluation, we did not find any emergent condition requiring admission or further testing in the hospital.  Your exam/testing today is overall reassuring.  Follow-up with your mental health expert to discuss your symptoms.  Please return to the Emergency Department if you experience any worsening of your condition.   Thank you for allowing [place] to be a part of your care.

## 2023-01-03 NOTE — Discharge Instructions (Signed)
Try using some lotion or similar on your feet to help with dry skin.

## 2023-01-03 NOTE — ED Provider Notes (Signed)
[facility]-EM[facility]GENCY DEPT [facility] Emergency Department Provider Note MRN:  [id number]  Arrival date & time: 01/03/23     Chief Complaint   Hallucinations   History of Present Illness   Molly [name] is a 37 y.o. year-old female with a history of schizophrenia presenting to the ED with chief complaint of hallucinations.  Patient feels like she has bugs all over her and her skin.  She sees ants crawling out of her legs.  She is pretty sure there are rats in her spine as well.  Review of Systems  A thorough review of systems was obtained and all systems are negative except as noted in the HPI and PMH.   Patient's Health History    Past Medical History:  Diagnosis Date   Abnormal Pap smear    ASC-cannot exclude HGSIL on Pap 02/15/2012   ASC-US on 02/03/2012 pap (associated Trichomonas infection). No reflex HPV testing performed on specimen.  Patient informed that she will need repeat Pap in one year.       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 Dennie Bible (Psych) At [facility] in  [place], [place]. 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   Qualifier: Diagnosis of  By: McDiarmid MD, Benjaman Pott USER 02/08/2009   Qualifier: Diagnosis of  By: Knox Royalty      Past Surgical History:  Procedure Laterality Date   INCISION AND DRAINAGE     pilanodal cyst   TIBIA IM NAIL INSERTION Right 10/26/2021   Procedure: INTRAMEDULLARY (IM) NAIL  TIBIAL;  Surgeon: Myrene Galas, MD;  Location: [facility] OR;  Service: Orthopedics;  Laterality: Right;   TOOTH EXTRACTION N/A 10/07/2017   Procedure: EXTRACTION TEETH NUMBERS ONE, SEVENTEEN, NINETEEN AND THIRTY TWO;  Surgeon: Ocie Doyne, DDS;  Location: [facility] OR;  Service: Oral Surgery;  Laterality: N/A;    Family History  Adopted: Yes  Problem Relation Age of Onset   Bipolar disorder Sister    Alcohol abuse Brother    Cancer Father    Diabetes Mother     Social History   Socioeconomic History   Marital status: Single    Spouse name: Not on file   Number of children: Not on file   Years of education: Not on file   Highest education level: Not on file  Occupational History   Not on file  Tobacco Use   Smoking status: Every Day    Current packs/day: 2.00    Average packs/day: 2.0 packs/day for 10.0 years (20.0 ttl pk-yrs)    Types: Cigarettes   Smokeless tobacco: Never  Vaping Use   Vaping status: Never Used  Substance and Sexual Activity   Alcohol use: No    Comment: occ   Drug use: No   Sexual activity: Yes    Birth control/protection: Injection  Other Topics Concern  Not on file  Social History Narrative   Adopted   Has Guardian   Living in [place] home with Bradly Chris   Transportation: Bus   Social Determinants of Health   Financial Resource Strain: Unknown (03/28/2020)   Received from [employer], [facility]   Overall Financial Resource Strain (CARDIA)    Difficulty of Paying Living Expenses: Patient declined  Food Insecurity: High Risk (05/26/2022)   [facility]   Hunger Vital Sign    Worried About Running Out of Food in the Last Year: Often true    Within the past 12 months, the food you bought just didn't last and you didn't have money to get more: Not on file  Transportation Needs: Unmet Transportation Needs (05/26/2022)   [facility]   Transportation    In the past 12 months, has lack of  reliable transportation kept you from medical appointments, meetings, work or from getting things needed for daily living? : Yes  Physical Activity: Not on file  Stress: No Stress Concern Present (03/28/2020)   [facility] from [facility], [facility]   [employer] of Occupational Health - Occupational Stress Questionnaire    Feeling of Stress : Not at all  Social Connections: Unknown (08/31/2021)   Received from [facility], [facility]   Social Network    Social Network: Not on file  Intimate Partner Violence: Low Risk  (05/26/2022)   Received from [facility] visits prior to 07/03/2022.   Safety    How often does anyone, including family and friends, physically hurt you?: Never    How often does anyone, including family and friends, insult or talk down to you?: Never    How often does anyone, including family and friends, threaten you with harm?: Never    How often does anyone, including family and friends, scream or curse at you?: Never     Physical Exam   Vitals:   01/03/23 0245  BP: (!) 143/101  Pulse: (!) 105  Resp: 18  Temp: 99 F (37.2 C)  SpO2: 98%    CONSTITUTIONAL: Chronically ill-appearing, NAD NEURO/PSYCH:  Alert and oriented x 3, no focal deficits, tangential and bizarre speech EYES:  eyes equal and reactive ENT/NECK:  no LAD, no JVD CARDIO: Regular rate, well-perfused, normal S1 and S2 PULM:  CTAB no wheezing or rhonchi GI/GU:  non-distended, non-tender MSK/SPINE:  No gross deformities, no edema SKIN:  no rash, atraumatic   *Additional and/or pertinent findings included in MDM below  Diagnostic and Interventional Summary    EKG Interpretation Date/Time:    Ventricular Rate:    PR Interval:    QRS Duration:    QT Interval:    QTC Calculation:   R Axis:      Text Interpretation:         Labs Reviewed - No data to display  No orders to display    Medications - No data to display   Procedures  /  Critical  Care Procedures  ED Course and Medical Decision Making  Initial Impression and Ddx Patient is well-known to the emergency department, was here only a couple hours ago.  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  Skin appears normal, no signs of insects or active infection.  No medical emergency is present at this time.  Past medical/surgical history that increases complexity of ED encounter: Schizophrenia  Interpretation of Diagnostics Laboratory and/or imaging options to  aid in the diagnosis/care of the patient were considered.  After careful history and physical examination, it was determined that there was no indication for diagnostics at this time.  Patient Reassessment and Ultimate Disposition/Management     Patient mostly needed reassurance.  I do not think she is in an unstable psychiatric state, not at risk of harm to self or others.  Medically clear as well.  Appropriate for discharge.  Patient management required discussion with the following services or consulting groups:  None  Complexity of Problems Addressed Acute complicated illness or Injury  Additional Data Reviewed and Analyzed Further history obtained from: Prior ED visit notes  Additional Factors Impacting ED Encounter Risk None  Elmer Sow. Pilar Plate, MD [facility] Emergency Medicine [facility] [email or web address]  Final Clinical Impressions(s) / ED Diagnoses     ICD-10-CM   1. Schizophrenia, unspecified type (HCC)  F20.9       ED Discharge Orders     None        Discharge Instructions Discussed with and Provided to Patient:    Discharge Instructions      You were evaluated in the Emergency Department and after careful evaluation, we did not find any emergent condition requiring admission or further testing in the hospital.  Your exam/testing today is overall reassuring.  Follow-up with your mental health expert to discuss your  symptoms.  Please return to the Emergency Department if you experience any worsening of your condition.   Thank you for allowing [place] to be a part of your care.      Sabas Sous, MD 01/03/23 [phone]

## 2023-01-03 NOTE — ED Notes (Addendum)
Pt verbalized understanding of d/c instructions. Pt ambulatory at d/c with independent steady gait. Pt refused discharge vitals - demanding to be "deleted from your system."

## 2023-01-21 ENCOUNTER — Other Ambulatory Visit: Payer: Self-pay

## 2023-01-21 ENCOUNTER — Emergency Department (HOSPITAL_COMMUNITY)
Admission: EM | Admit: 2023-01-21 | Discharge: 2023-01-21 | Disposition: A | Discharge status: Home / Self Care | Payer: 59 | Attending: Emergency Medicine | Admitting: Emergency Medicine

## 2023-01-21 ENCOUNTER — Encounter (HOSPITAL_COMMUNITY): Payer: Self-pay

## 2023-01-21 DIAGNOSIS — I10 Essential (primary) hypertension: Secondary | ICD-10-CM | POA: Diagnosis not present

## 2023-01-21 DIAGNOSIS — Z765 Malingerer [conscious simulation]: Secondary | ICD-10-CM | POA: Insufficient documentation

## 2023-01-21 DIAGNOSIS — Z79899 Other long term (current) drug therapy: Secondary | ICD-10-CM | POA: Insufficient documentation

## 2023-01-21 DIAGNOSIS — Z113 Encounter for screening for infections with a predominantly sexual mode of transmission: Secondary | ICD-10-CM | POA: Diagnosis present

## 2023-01-21 DIAGNOSIS — E119 Type 2 diabetes mellitus without complications: Secondary | ICD-10-CM | POA: Diagnosis not present

## 2023-01-21 DIAGNOSIS — J45909 Unspecified asthma, uncomplicated: Secondary | ICD-10-CM | POA: Insufficient documentation

## 2023-01-21 NOTE — Discharge Instructions (Signed)
Please return to the ED with any new or worsening symptoms.

## 2023-01-21 NOTE — ED Triage Notes (Addendum)
Pt BIB EMS. Pt was sleeping on the sidewalk and police called in to check on pt. Pt is in a halloween costume and asks if we can test her for whatever.

## 2023-01-21 NOTE — ED Notes (Signed)
PT refuse to have BP cuff place correctly on arm for vitals. Movement during vitals

## 2023-01-21 NOTE — ED Provider Notes (Addendum)
[place] EMERGENCY DEPARTMENT AT [facility] Provider Note   CSN: [id number] Arrival date & time: 01/21/23  0243     History  Chief Complaint  Patient presents with   Psychiatric Evaluation    Molly [name] is a 37 y.o. female with extensive medical history to include schizophrenia, hypertension, diabetes, depression, asthma, tobacco user.  Patient presents to ED requesting GC/chlamydia screening.  Patient reports that she has no symptoms, denies dysuria, vaginal discharge.  Denies fevers, pelvic pain, lower abdominal pain.  She denies any new sexual partners.  She was found laying in the grass outside and brought in by [facility], currently wearing a Halloween costume to include "scream" mask.  Denies chest pain, shortness of breath, nausea vomiting, diarrhea.  Denies SI, HI, AVH.  Not responding to internal stimuli.  HPI     Home Medications Prior to Admission medications   Medication Sig Start Date End Date Taking? Authorizing Provider  albuterol (VENTOLIN HFA) 108 (90 Base) MCG/ACT inhaler Inhale 1 puff into the lungs every 4 (four) hours as needed for wheezing or shortness of breath.    [provider]  divalproex (DEPAKOTE ER) 500 MG 24 hr tablet Take 1,000 mg by mouth at bedtime. 12/17/22   [provider]  fluPHENAZine (PROLIXIN) 10 MG tablet Take 10 mg by mouth at bedtime. 05/25/22   [provider]  fluPHENAZine decanoate (PROLIXIN) 25 MG/ML injection Inject 25 mg into the muscle every 21 ( twenty-one) days.    [provider]  lisinopril (ZESTRIL) 5 MG tablet Take 5 mg by mouth daily. Patient not taking: Reported on 12/20/2022 03/31/22   [provider]  lithium carbonate (LITHOBID) 300 MG ER tablet Take 300 mg by mouth 2 (two) times daily. 12/17/22   [provider]  OLANZapine (ZYPREXA) 10 MG tablet Take 1 tablet (10 mg total) by mouth in the morning and at bedtime. 07/10/22 12/20/22  Darrick Grinder, PA-C       Allergies    Abilify [aripiprazole]    Review of Systems   Review of Systems  All other systems reviewed and are negative.   Physical Exam Updated Vital Signs BP (!) 157/96 (BP Location: Left Arm)   Pulse 86   Temp 98.9 F (37.2 C)   Resp 16   Ht 5\' 8"  (1.727 m)   Wt 116 kg   LMP 12/02/2022 (Approximate)   SpO2 100%   BMI 38.88 kg/m  Physical Exam Vitals and nursing note reviewed.  Constitutional:      General: She is not in acute distress.    Appearance: She is well-developed.  HENT:     Head: Normocephalic and atraumatic.  Eyes:     Conjunctiva/sclera: Conjunctivae normal.  Cardiovascular:     Rate and Rhythm: Normal rate and regular rhythm.     Heart sounds: No murmur heard. Pulmonary:     Effort: Pulmonary effort is normal. No respiratory distress.     Breath sounds: Normal breath sounds.  Abdominal:     Palpations: Abdomen is soft.     Tenderness: There is no abdominal tenderness.  Musculoskeletal:        General: No swelling.     Cervical back: Neck supple.  Skin:    General: Skin is warm and dry.     Capillary Refill: Capillary refill takes less than 2 seconds.  Neurological:     Mental Status: She is alert.  Psychiatric:        Mood and Affect:  Mood normal.     ED Results / Procedures / Treatments   Labs (all labs ordered are listed, but only abnormal results are displayed) Labs Reviewed  GC/CHLAMYDIA PROBE AMP ([place]) NOT AT [facility]    EKG None  Radiology No results found.  Procedures Procedures   Medications Ordered in ED Medications - No data to display  ED Course/ Medical Decision Making/ A&P  Medical Decision Making  37 year old female presents to ED.  Please see HPI for further details.  On examination patient is afebrile and nontachycardic.  Lung sounds are bilaterally, nontoxic.  Abdomen soft and compressible throughout.  Neurological examination at baseline.  Alert and oriented x 4.  Not responding to internal  stimuli.  Patient here requesting STI testing.  Nursing staff asked patient for urine sample but she is not denying that she will provide urine sample and states that she does not wish to be tested now.  Patient is malingering.  The patient will be discharged back to the community at this time.  Patient not acutely psychotic.   Final Clinical Impression(s) / ED Diagnoses Final diagnoses:  Malingering    Rx / DC Orders ED Discharge Orders     None           Al Decant, PA-C 01/21/23 0541    Zadie Rhine, MD 01/21/23 [phone]

## 2023-02-11 ENCOUNTER — Emergency Department (HOSPITAL_COMMUNITY)
Admission: EM | Admit: 2023-02-11 | Discharge: 2023-02-11 | Disposition: A | Discharge status: Home / Self Care | Payer: 59 | Attending: Emergency Medicine | Admitting: Emergency Medicine

## 2023-02-11 ENCOUNTER — Encounter (HOSPITAL_COMMUNITY): Payer: Self-pay | Admitting: *Deleted

## 2023-02-11 ENCOUNTER — Other Ambulatory Visit: Payer: Self-pay

## 2023-02-11 DIAGNOSIS — Z5321 Procedure and treatment not carried out due to patient leaving prior to being seen by health care provider: Secondary | ICD-10-CM | POA: Insufficient documentation

## 2023-02-11 DIAGNOSIS — F1010 Alcohol abuse, uncomplicated: Secondary | ICD-10-CM | POA: Insufficient documentation

## 2023-02-11 LAB — CBG MONITORING, ED: Glucose-Capillary: 108 mg/dL — ABNORMAL HIGH (ref 70–99)

## 2023-02-11 NOTE — ED Notes (Signed)
Pt stated "I'm all better now" pt walked out .

## 2023-02-11 NOTE — ED Triage Notes (Signed)
Brought in to the ed via [facility] states she was laying on a park bench and was about to fall off the bench ETOH abuse. Patient was able to get off ems stretcher and into a wheelchair.

## 2023-02-23 ENCOUNTER — Emergency Department (HOSPITAL_COMMUNITY)
Admission: EM | Admit: 2023-02-23 | Discharge: 2023-02-25 | Disposition: A | Discharge status: Other Acute Inpatient Hospital | Payer: 59 | Attending: *Deleted | Admitting: *Deleted

## 2023-02-23 ENCOUNTER — Other Ambulatory Visit: Payer: Self-pay

## 2023-02-23 ENCOUNTER — Encounter (HOSPITAL_COMMUNITY): Payer: Self-pay

## 2023-02-23 DIAGNOSIS — F259 Schizoaffective disorder, unspecified: Secondary | ICD-10-CM | POA: Diagnosis not present

## 2023-02-23 DIAGNOSIS — Z5900 Homelessness unspecified: Secondary | ICD-10-CM | POA: Diagnosis not present

## 2023-02-23 DIAGNOSIS — F29 Unspecified psychosis not due to a substance or known physiological condition: Secondary | ICD-10-CM | POA: Diagnosis not present

## 2023-02-23 DIAGNOSIS — Z91148 Patient's other noncompliance with medication regimen for other reason: Secondary | ICD-10-CM | POA: Diagnosis not present

## 2023-02-23 DIAGNOSIS — Z79899 Other long term (current) drug therapy: Secondary | ICD-10-CM | POA: Insufficient documentation

## 2023-02-23 DIAGNOSIS — R456 Violent behavior: Secondary | ICD-10-CM | POA: Insufficient documentation

## 2023-02-23 DIAGNOSIS — R443 Hallucinations, unspecified: Secondary | ICD-10-CM

## 2023-02-23 DIAGNOSIS — Z046 Encounter for general psychiatric examination, requested by authority: Secondary | ICD-10-CM

## 2023-02-23 DIAGNOSIS — F250 Schizoaffective disorder, bipolar type: Secondary | ICD-10-CM | POA: Diagnosis not present

## 2023-02-23 LAB — CBC WITH DIFFERENTIAL/PLATELET
Abs Immature Granulocytes: 0.07 10*3/uL (ref 0.00–0.07)
Basophils Absolute: 0.1 10*3/uL (ref 0.0–0.1)
Basophils Relative: 1 %
Eosinophils Absolute: 0.2 10*3/uL (ref 0.0–0.5)
Eosinophils Relative: 1 %
HCT: 35.1 % — ABNORMAL LOW (ref 36.0–46.0)
Hemoglobin: 11.2 g/dL — ABNORMAL LOW (ref 12.0–15.0)
Immature Granulocytes: 1 %
Lymphocytes Relative: 22 %
Lymphs Abs: 3.4 10*3/uL (ref 0.7–4.0)
MCH: 27.7 pg (ref 26.0–34.0)
MCHC: 31.9 g/dL (ref 30.0–36.0)
MCV: 86.9 fL (ref 80.0–100.0)
Monocytes Absolute: 1.3 10*3/uL — ABNORMAL HIGH (ref 0.1–1.0)
Monocytes Relative: 9 %
Neutro Abs: 10.4 10*3/uL — ABNORMAL HIGH (ref 1.7–7.7)
Neutrophils Relative %: 66 %
Platelets: 356 10*3/uL (ref 150–400)
RBC: 4.04 MIL/uL (ref 3.87–5.11)
RDW: 15.8 % — ABNORMAL HIGH (ref 11.5–15.5)
WBC: 15.4 10*3/uL — ABNORMAL HIGH (ref 4.0–10.5)
nRBC: 0 % (ref 0.0–0.2)

## 2023-02-23 LAB — SALICYLATE LEVEL: Salicylate Lvl: <7 mg/dL — ABNORMAL LOW (ref 7.0–30.0)

## 2023-02-23 LAB — ACETAMINOPHEN LEVEL: Acetaminophen (Tylenol), Serum: <10 ug/mL — ABNORMAL LOW (ref 10–30)

## 2023-02-23 LAB — COMPREHENSIVE METABOLIC PANEL
ALT: 9 U/L (ref 0–44)
AST: 12 U/L — ABNORMAL LOW (ref 15–41)
Albumin: 3.6 g/dL (ref 3.5–5.0)
Alkaline Phosphatase: 74 U/L (ref 38–126)
Anion gap: 9 (ref 5–15)
BUN: <5 mg/dL — ABNORMAL LOW (ref 6–20)
CO2: 21 mmol/L — ABNORMAL LOW (ref 22–32)
Calcium: 8.4 mg/dL — ABNORMAL LOW (ref 8.9–10.3)
Chloride: 106 mmol/L (ref 98–111)
Creatinine, Ser: 0.36 mg/dL — ABNORMAL LOW (ref 0.44–1.00)
GFR, Estimated: >60 mL/min (ref >60)
Glucose, Bld: 93 mg/dL (ref 70–99)
Potassium: 3.8 mmol/L (ref 3.5–5.1)
Sodium: 136 mmol/L (ref 135–145)
Total Bilirubin: 0.6 mg/dL (ref 0.3–1.2)
Total Protein: 7.5 g/dL (ref 6.5–8.1)

## 2023-02-23 LAB — HCG, SERUM, QUALITATIVE: Preg, Serum: NEGATIVE

## 2023-02-23 LAB — ETHANOL: Alcohol, Ethyl (B): <10 mg/dL (ref <10)

## 2023-02-23 MED ORDER — ZIPRASIDONE MESYLATE 20 MG IM SOLR
10.0000 mg | Freq: Once | INTRAMUSCULAR | Status: DC
  Filled 2023-02-23: qty 20

## 2023-02-23 MED ORDER — STERILE WATER FOR INJECTION IJ SOLN
INTRAMUSCULAR | Status: AC
  Filled 2023-02-23: qty 10

## 2023-02-23 MED ORDER — ZIPRASIDONE MESYLATE 20 MG IM SOLR
10.0000 mg | INTRAMUSCULAR | Status: DC | PRN
  Administered 2023-02-23: 10 mg via INTRAMUSCULAR
  Filled 2023-02-23: qty 20

## 2023-02-23 NOTE — BH Assessment (Addendum)
Patient has been referred to [facility] telecare.  Marylene Land w/ [facility] will contact team in a secure message as to when and who will do the teleassessment  for patient.
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
## 2023-02-23 NOTE — ED Notes (Signed)
Pt started grabbing things on the wall and trying to pull things down and throwing them. Paramedic, PA  were notified and security was called. Staff removed everything was moved out of the that could have been easily thrown. The stuff has been put in the conference room.

## 2023-02-23 NOTE — ED Notes (Signed)
All labs collected by phlebotomist
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
## 2023-02-23 NOTE — ED Notes (Signed)
Phlebotomy at bedside.

## 2023-02-23 NOTE — ED Notes (Signed)
Pt has been dressed out into burgundy scrubs. All her belongings have been placed in the cabinet 1-4. Pt has been wanded

## 2023-02-23 NOTE — ED Notes (Signed)
Multiple staff members attempted to stick pt. Staff members werent able to find anything.

## 2023-02-24 ENCOUNTER — Encounter (HOSPITAL_COMMUNITY): Payer: Self-pay | Admitting: Psychiatric/Mental Health

## 2023-02-24 DIAGNOSIS — F250 Schizoaffective disorder, bipolar type: Secondary | ICD-10-CM

## 2023-02-24 DIAGNOSIS — R456 Violent behavior: Secondary | ICD-10-CM | POA: Diagnosis not present

## 2023-02-24 LAB — RAPID URINE DRUG SCREEN, HOSP PERFORMED
Amphetamines: NOT DETECTED
Barbiturates: NOT DETECTED
Benzodiazepines: NOT DETECTED
Cocaine: NOT DETECTED
Opiates: NOT DETECTED
Tetrahydrocannabinol: POSITIVE — AB

## 2023-02-24 LAB — VALPROIC ACID LEVEL: Valproic Acid Lvl: 12 ug/mL — ABNORMAL LOW (ref 50.0–100.0)

## 2023-02-24 LAB — LITHIUM LEVEL: Lithium Lvl: 0.07 mmol/L — ABNORMAL LOW (ref 0.60–1.20)

## 2023-02-24 MED ORDER — STERILE WATER FOR INJECTION IJ SOLN
INTRAMUSCULAR | Status: AC
  Filled 2023-02-24: qty 10

## 2023-02-24 MED ORDER — ZIPRASIDONE MESYLATE 20 MG IM SOLR
20.0000 mg | Freq: Once | INTRAMUSCULAR | Status: AC
  Administered 2023-02-24: 20 mg via INTRAMUSCULAR
  Filled 2023-02-24: qty 20

## 2023-02-24 MED ORDER — STERILE WATER FOR INJECTION IJ SOLN
INTRAMUSCULAR | Status: AC
  Administered 2023-02-24: 10 mL
  Filled 2023-02-24: qty 10

## 2023-02-24 MED ORDER — HYDROXYZINE HCL 25 MG PO TABS
25.0000 mg | ORAL_TABLET | Freq: Three times a day (TID) | ORAL | Status: DC | PRN
  Administered 2023-02-24 – 2023-02-25 (×2): 25 mg via ORAL
  Filled 2023-02-24 (×3): qty 1

## 2023-02-24 MED ORDER — ZIPRASIDONE MESYLATE 20 MG IM SOLR
20.0000 mg | INTRAMUSCULAR | Status: DC | PRN
  Administered 2023-02-24: 20 mg via INTRAMUSCULAR
  Filled 2023-02-24: qty 20

## 2023-02-24 MED ORDER — LORAZEPAM 2 MG/ML IJ SOLN
2.0000 mg | Freq: Once | INTRAMUSCULAR | Status: AC
  Administered 2023-02-24: 2 mg via INTRAMUSCULAR
  Filled 2023-02-24: qty 1

## 2023-02-24 MED ORDER — OLANZAPINE 10 MG PO TABS
10.0000 mg | ORAL_TABLET | Freq: Two times a day (BID) | ORAL | Status: DC
  Administered 2023-02-24 – 2023-02-25 (×3): 10 mg via ORAL
  Filled 2023-02-24: qty 1
  Filled 2023-02-24: qty 2
  Filled 2023-02-24: qty 1

## 2023-02-24 MED ORDER — DIVALPROEX SODIUM ER 500 MG PO TB24
1000.0000 mg | ORAL_TABLET | Freq: Every day | ORAL | Status: DC
  Administered 2023-02-24: 1000 mg via ORAL
  Filled 2023-02-24: qty 2

## 2023-02-24 NOTE — Progress Notes (Signed)
[facility] [name] Progress Note   02/24/2023    11:54 PM  Molly [name]   [id number]   Type of Contact and Topic:  Psychiatric Bed Placement   Pt accepted to [facility]   Patient meets inpatient criteria per Ophelia Shoulder, NP  The attending provider will be Dr. Loni Beckwith  Call report to [phone]  Crissie Reese @ [facility] ED notified.     Pt scheduled  to arrive at [facility] AFTER 0900.   Damita Dunnings, MSW, [name]-A  11:55 PM 02/24/2023

## 2023-02-24 NOTE — ED Notes (Signed)
Patient in room talking to someone, became loud and threw coffee against wall. Was able to clean up spill on her own and did apologize. State that she was hearing voices and need a medication to control her thoughts. NP notified.

## 2023-02-24 NOTE — BH Assessment (Signed)
Per Sharyon Cable, Tyler Aas, NP will be available to completed the pt's TTS assessment at 0700. At this time pt's dayshift nurse is not assigned.   The TTS cart will need to be set up for the assessment.   Redmond Pulling, MS, [facility], [facility] Triage Specialist

## 2023-02-24 NOTE — ED Notes (Signed)
Patient urinated into urine cup held on to cup filled blood. (She is on her monthly cycle) and would not give the cup up after being asked several times. Finally tech asked her to give the cup to staff patient poured contents into the floor. Asked for coffee  while yelling at staff. Slammed the door and threw herself onto the floor while yelling and crying loudly.

## 2023-02-24 NOTE — ED Provider Notes (Signed)
Emergency Medicine Observation Re-evaluation Note  Molly [name] is a 37 y.o. female, seen on rounds today.  Pt initially presented to the ED for complaints of IVC Currently, the patient is under ivc, currently sleeping.  Physical Exam  BP (!) 157/108 (BP Location: Right Arm)   Pulse 99   Temp 98.6 F (37 C) (Oral)   Resp 18   Ht 1.727 m (5\' 8" )   Wt 117 kg   SpO2 100%   BMI 39.22 kg/m  Physical Exam General: nad Cardiac: regular rate Lungs: normal effort Psych: sleeping  ED Course / MDM  EKG:   I have reviewed the labs performed to date as well as medications administered while in observation.  Recent changes in the last 24 hours include pt placed in restraints.  Now sleeping, calm.  Will remove restraints.  Plan  Current plan is for inpatient psych.    Linwood Dibbles, MD 02/24/23 [phone]

## 2023-02-24 NOTE — ED Notes (Signed)
Pt reports wanting to leave. Nurse informed pt of current status. Pt becoming increasingly agitated. Pt threw coffee across hallway towards staff. MD notified, orders placed.

## 2023-02-24 NOTE — ED Notes (Signed)
Molly [name] appears to responding to internal stimuli throwing coffee against room wall loud yelling and screaming as well as banging head against head board of her bed. Several attempts to verbally de-escalate were unsuccessful. Molly [name] remains agitated with screaming and door slamming pt stop from punch mirror in her room by staff no injury noted. Security at bedside no hands on necessary as Molly [name] agreed to voluntarily take IM geodon which was given in left deltoid.
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
## 2023-02-24 NOTE — ED Notes (Signed)
Pt wanted to have blanket on her face to help her sleep

## 2023-02-24 NOTE — ED Notes (Signed)
Came in pt was laying in period blood , cleaned pt and provided a pad and under wear and clean sheets.

## 2023-02-24 NOTE — Progress Notes (Signed)
Patient has been denied by [facility] due to no appropriate beds available. Patient meets [facility] inpatient criteria per Ophelia Shoulder, NP. Patient has been faxed out to the following facilities:   [facility]  [address] [place] [place] [address] [phone]  [facility] [address]  [place] [address] [phone]  [facility]  [address] [place] [address] [phone]  [facility]  [address] [place] [address] [phone]  [facility]  [address] [place] [address] [phone]  [facility] Patient Placement  [facility], [place] [phone]  [facility]  [address] [place] [address] [phone]  [facility]  [address], [place] [address] [phone]  [facility]  [address], [place] [address] [phone]  [facility]  [address] [place] [address] [phone]  [facility]  [address] [place] [address] [phone]  [facility]  [address] [place], [place] [address] [phone]  [facility]  [address] [place], [place] [address] [phone]  [facility]  [address] [place] [address] [phone]  [facility]  [address], [place] [address] [phone]  [facility]  [address], [place] [address] [phone]  [facility]  [address], [place] [address] [phone]   Damita Dunnings, MSW, [name]-A  11:24 PM  02/24/2023

## 2023-02-25 ENCOUNTER — Encounter (HOSPITAL_COMMUNITY): Payer: Self-pay

## 2023-02-25 DIAGNOSIS — R456 Violent behavior: Secondary | ICD-10-CM | POA: Diagnosis not present

## 2023-02-25 MED ORDER — LORAZEPAM 1 MG PO TABS
1.0000 mg | ORAL_TABLET | Freq: Three times a day (TID) | ORAL | Status: DC | PRN
  Administered 2023-02-25 (×2): 1 mg via ORAL
  Filled 2023-02-25 (×2): qty 1

## 2023-02-25 NOTE — ED Provider Notes (Signed)
Emergency Medicine Observation Re-evaluation Note  Molly [name] is a 37 y.o. female, seen on rounds today.  Pt initially presented to the ED for complaints of IVC Currently, the patient is showering.  Physical Exam  BP (!) 137/97 (BP Location: Left Arm)   Pulse 88   Temp 97.6 F (36.4 C) (Oral)   Resp 16   Ht 5\' 8"  (1.727 m)   Wt 117 kg   SpO2 100%   BMI 39.22 kg/m  Physical Exam General: deferred Cardiac: deferred Lungs: deferred Psych: deferred  ED Course / MDM  EKG:   I have reviewed the labs performed to date as well as medications administered while in observation.  Recent changes in the last 24 hours include psychiatric meds.  Plan  Current plan is for transfer to [facility] this AM.    Pricilla Loveless, MD 02/25/23 [phone]

## 2023-02-25 NOTE — Progress Notes (Addendum)
ADDENDUM  3:14 PM - CSW attempted to speak with Lyla Son, intake RN, at [facility] but was unable to leave a voicemail. Attempted x3  12:53 PM - CSW attempted to speak with Lyla Son, intake RN, at [facility] but was unable to leave a voicemail. Attempted x2  CSW sent most recent RN note to [facility] for review of pt's improvement in behaviors. CSW attempted to speak with Lyla Son, intake RN, at [facility] but was unable to leave a voicemail. CSW will continue to assist with placement.  Cathie Beams, MSW, [name]  02/25/2023 12:55 PM

## 2023-02-25 NOTE — Progress Notes (Signed)
8:28 AM - CSW spoke with Lyla Son, intake Charity fundraiser, at [facility]. Per Lyla Son, pt's admission to [facility] this morning has been delayed to this afternoon due to pt's psychotic symptoms and stability for transport. CSW will notify team of pt needing reassessment this afternoon before transferring to [facility].  Cathie Beams, MSW, [name]  02/25/2023 8:33 AM

## 2023-02-25 NOTE — ED Notes (Signed)
Pt transport on the way.  Pt asked to put her clothes back on because she is walking around with a sheet on instead of her shirt.  Pt finally complied.  Then pt started asking for items she could take with her.  Wash cloths, towels, the sheet she had around her, etc.  So after we told her no, she gave up on that.

## 2023-02-25 NOTE — ED Notes (Signed)
Patient  has been alert this shift.    Patient medication compliant with encouragement and discussion.  Patient has had yelling out and crying out episodes.  Labile with emotions. Patient disorganized, paranoid and has delusional thoughts.  Behaviors redirectable.

## 2023-02-25 NOTE — ED Notes (Addendum)
Call from [facility] pt has been accepted for admission on 02/25/23 after 0900 accepting doctor is Dr. Gilman Buttner. Pt is IVC'd and sheriff  dept called and message left. Report can be called to [phone].

## 2023-03-26 ENCOUNTER — Other Ambulatory Visit: Payer: Self-pay

## 2023-03-26 ENCOUNTER — Emergency Department (HOSPITAL_COMMUNITY)
Admission: EM | Admit: 2023-03-26 | Discharge: 2023-03-26 | Discharge status: Against Medical Advice | Payer: 59 | Attending: Emergency Medicine | Admitting: Emergency Medicine

## 2023-03-26 DIAGNOSIS — R252 Cramp and spasm: Secondary | ICD-10-CM | POA: Insufficient documentation

## 2023-03-26 DIAGNOSIS — Z5321 Procedure and treatment not carried out due to patient leaving prior to being seen by health care provider: Secondary | ICD-10-CM | POA: Diagnosis not present

## 2023-03-26 NOTE — ED Triage Notes (Signed)
Homeless patient via EMS after they were called by a bystanders when they observed blood on the patient. Patient endorses being on her menstrual cycle and denies further complaints. Wanted to come to the hospital to get cleaned up, as she had no pants or sanitary pads with her.

## 2023-03-26 NOTE — ED Provider Notes (Signed)
Patient eloped prior to my evaluation.   Marita [place], PA-C 03/26/23 1532    Gloris Manchester, MD 03/26/23 [phone]

## 2023-04-01 ENCOUNTER — Encounter (HOSPITAL_COMMUNITY): Payer: Self-pay

## 2023-04-01 ENCOUNTER — Emergency Department (HOSPITAL_COMMUNITY)
Admission: EM | Admit: 2023-04-01 | Discharge: 2023-04-01 | Disposition: A | Discharge status: Home / Self Care | Payer: 59 | Attending: Emergency Medicine | Admitting: Emergency Medicine

## 2023-04-01 ENCOUNTER — Emergency Department (HOSPITAL_COMMUNITY)
Admission: EM | Admit: 2023-04-01 | Discharge: 2023-04-02 | Disposition: A | Discharge status: Home / Self Care | Payer: 59 | Attending: Emergency Medicine | Admitting: Emergency Medicine

## 2023-04-01 ENCOUNTER — Other Ambulatory Visit: Payer: Self-pay

## 2023-04-01 DIAGNOSIS — R1111 Vomiting without nausea: Secondary | ICD-10-CM

## 2023-04-01 DIAGNOSIS — R519 Headache, unspecified: Secondary | ICD-10-CM | POA: Insufficient documentation

## 2023-04-01 DIAGNOSIS — R443 Hallucinations, unspecified: Secondary | ICD-10-CM | POA: Insufficient documentation

## 2023-04-01 DIAGNOSIS — R45851 Suicidal ideations: Secondary | ICD-10-CM | POA: Insufficient documentation

## 2023-04-01 DIAGNOSIS — F17210 Nicotine dependence, cigarettes, uncomplicated: Secondary | ICD-10-CM | POA: Insufficient documentation

## 2023-04-01 DIAGNOSIS — R112 Nausea with vomiting, unspecified: Secondary | ICD-10-CM | POA: Insufficient documentation

## 2023-04-01 DIAGNOSIS — J45909 Unspecified asthma, uncomplicated: Secondary | ICD-10-CM | POA: Insufficient documentation

## 2023-04-01 DIAGNOSIS — E119 Type 2 diabetes mellitus without complications: Secondary | ICD-10-CM | POA: Insufficient documentation

## 2023-04-01 DIAGNOSIS — I10 Essential (primary) hypertension: Secondary | ICD-10-CM | POA: Insufficient documentation

## 2023-04-01 NOTE — ED Provider Notes (Signed)
[place] EMERGENCY DEPARTMENT AT [facility] Provider Note   CSN: [id number] Arrival date & time: 04/01/23  1948     History  Chief Complaint  Patient presents with   Emesis    Molly [name] is a 37 y.o. female.  HPI Patient reports that she ate a plate of [place] dinner leftovers.  Shortly thereafter she started to get extremely nauseated and got a headache.  She reports that she vomited once but then still felt pretty bad.  She reports that she felt that there was more to come out so she put her finger in her throat and induced vomiting.  She reports since then, she feels much better.  She reports that she no longer feels nauseated and her headache has resolved.  Did not develop any other associated symptoms.  At this time, she is now requesting some coffee.    Home Medications Prior to Admission medications   Medication Sig Start Date End Date Taking? Authorizing Provider  acetaminophen (TYLENOL) 500 MG tablet Take 500-1,000 mg by mouth every 6 (six) hours as needed for mild pain (pain score 1-3) or headache.    [provider]  albuterol (VENTOLIN HFA) 108 (90 Base) MCG/ACT inhaler Inhale 1-2 puffs into the lungs every 4 (four) hours as needed for wheezing or shortness of breath.    [provider]  divalproex (DEPAKOTE ER) 500 MG 24 hr tablet Take 1,000 mg by mouth at bedtime. 12/17/22   [provider]  fluPHENAZine (PROLIXIN) 10 MG tablet Take 10 mg by mouth at bedtime. 05/25/22   [provider]  fluPHENAZine decanoate (PROLIXIN) 25 MG/ML injection Inject 25 mg into the muscle every 21 ( twenty-one) days.    [provider]  lithium carbonate (LITHOBID) 300 MG ER tablet Take 300 mg by mouth 2 (two) times daily. 12/17/22   [provider]  OLANZapine (ZYPREXA) 10 MG tablet Take 1 tablet (10 mg total) by mouth in the morning and at bedtime. 07/10/22 02/23/23  Darrick Grinder, PA-C      Allergies    Abilify  [aripiprazole]    Review of Systems   Review of Systems  Physical Exam Updated Vital Signs BP (!) 153/106   Pulse 78   Temp 98.1 F (36.7 C) (Oral)   Resp 16   Ht 5\' 8"  (1.727 m)   Wt 117 kg   LMP 03/26/2023 (Exact Date)   SpO2 100%   BMI 39.22 kg/m  Physical Exam Constitutional:      Comments: Alert nontoxic no acute distress  HENT:     Mouth/Throat:     Pharynx: Oropharynx is clear.  Eyes:     Extraocular Movements: Extraocular movements intact.  Cardiovascular:     Rate and Rhythm: Normal rate and regular rhythm.  Pulmonary:     Effort: Pulmonary effort is normal.     Breath sounds: Normal breath sounds.  Abdominal:     General: There is no distension.     Palpations: Abdomen is soft.     Tenderness: There is no abdominal tenderness. There is no guarding.  Musculoskeletal:        General: No swelling. Normal range of motion.     Right lower leg: No edema.     Left lower leg: No edema.  Skin:    General: Skin is warm and dry.  Neurological:     General: No focal deficit present.     Mental Status: She is oriented to person, place,  and time.  Psychiatric:        Mood and Affect: Mood normal.     ED Results / Procedures / Treatments   Labs (all labs ordered are listed, but only abnormal results are displayed) Labs Reviewed - No data to display   EKG None  Radiology No results found.  Procedures Procedures    Medications Ordered in ED Medications - No data to display  ED Course/ Medical Decision Making/ A&P                                 Medical Decision Making  Patient presents as outlined.  She had abrupt onset of symptoms of feeling unwell after eating some leftovers.  She reports a sudden headache and nausea with an episode of vomiting.  Then a subsequent episode of self-induced vomiting.  Now all symptoms are resolved.  Patient is showing no signs of distress.  Abdomen is nontender.  At this time I do not feel that further diagnostic  studies are needed.  I have reviewed plan with the patient to return if there are some worsening or changing symptoms otherwise follow-up with PCP if needed.        Final Clinical Impression(s) / ED Diagnoses Final diagnoses:  Vomiting without nausea, unspecified vomiting type    Rx / DC Orders ED Discharge Orders     None         Arby Barrette, MD 04/01/23 2249

## 2023-04-01 NOTE — ED Notes (Signed)
Pt actively vomiting in triage. Vomited on floor.

## 2023-04-01 NOTE — ED Triage Notes (Signed)
Pt BIB EMS states she ate a [place] plate a couple hours ago and then vomited. Denies abd pain and nausea. Now c/o headache and states "I feel sick" but states she does not feel that she is going to vomit.  EMS VS 170/100 HR 90 CBG 129

## 2023-04-01 NOTE — Discharge Instructions (Signed)
1.  At this time your symptoms are gone.  Resume a very mild diet and follow-up with your doctor if you have any ongoing problems.

## 2023-04-01 NOTE — ED Triage Notes (Signed)
Pt was recently incarcerated and has had not had psych meds since last Tuesday. Pt feels shaky and having auditory and visual hallucinations. Denies SI/HI. Pt calm in triage. Pt would like a psych eval and refill on meds

## 2023-04-02 ENCOUNTER — Encounter (HOSPITAL_COMMUNITY): Payer: Self-pay | Admitting: *Deleted

## 2023-04-02 ENCOUNTER — Emergency Department (HOSPITAL_COMMUNITY)
Admission: EM | Admit: 2023-04-02 | Discharge: 2023-04-03 | Disposition: A | Discharge status: Home / Self Care | Payer: 59 | Attending: Emergency Medicine | Admitting: Emergency Medicine

## 2023-04-02 ENCOUNTER — Other Ambulatory Visit: Payer: Self-pay

## 2023-04-02 DIAGNOSIS — I10 Essential (primary) hypertension: Secondary | ICD-10-CM | POA: Diagnosis not present

## 2023-04-02 DIAGNOSIS — Z765 Malingerer [conscious simulation]: Secondary | ICD-10-CM | POA: Diagnosis not present

## 2023-04-02 DIAGNOSIS — Z5900 Homelessness unspecified: Secondary | ICD-10-CM | POA: Diagnosis not present

## 2023-04-02 DIAGNOSIS — F17210 Nicotine dependence, cigarettes, uncomplicated: Secondary | ICD-10-CM | POA: Diagnosis not present

## 2023-04-02 DIAGNOSIS — R112 Nausea with vomiting, unspecified: Secondary | ICD-10-CM | POA: Diagnosis not present

## 2023-04-02 DIAGNOSIS — R1110 Vomiting, unspecified: Secondary | ICD-10-CM | POA: Diagnosis present

## 2023-04-02 DIAGNOSIS — E119 Type 2 diabetes mellitus without complications: Secondary | ICD-10-CM | POA: Diagnosis not present

## 2023-04-02 DIAGNOSIS — J45909 Unspecified asthma, uncomplicated: Secondary | ICD-10-CM | POA: Diagnosis not present

## 2023-04-02 DIAGNOSIS — R5381 Other malaise: Secondary | ICD-10-CM | POA: Diagnosis present

## 2023-04-02 DIAGNOSIS — R110 Nausea: Secondary | ICD-10-CM | POA: Insufficient documentation

## 2023-04-02 DIAGNOSIS — B349 Viral infection, unspecified: Secondary | ICD-10-CM | POA: Diagnosis not present

## 2023-04-02 DIAGNOSIS — R519 Headache, unspecified: Secondary | ICD-10-CM | POA: Diagnosis present

## 2023-04-02 DIAGNOSIS — Z7951 Long term (current) use of inhaled steroids: Secondary | ICD-10-CM | POA: Diagnosis not present

## 2023-04-02 DIAGNOSIS — F17200 Nicotine dependence, unspecified, uncomplicated: Secondary | ICD-10-CM | POA: Diagnosis not present

## 2023-04-02 DIAGNOSIS — Z79899 Other long term (current) drug therapy: Secondary | ICD-10-CM | POA: Diagnosis not present

## 2023-04-02 LAB — COMPREHENSIVE METABOLIC PANEL
ALT: 12 U/L (ref 0–44)
ALT: 12 U/L (ref 0–44)
AST: 15 U/L (ref 15–41)
AST: 16 U/L (ref 15–41)
Albumin: 3.9 g/dL (ref 3.5–5.0)
Albumin: 3.6 g/dL (ref 3.5–5.0)
Alkaline Phosphatase: 76 U/L (ref 38–126)
Alkaline Phosphatase: 115 U/L (ref 38–126)
Anion gap: 9 (ref 5–15)
Anion gap: 12 (ref 5–15)
BUN: 9 mg/dL (ref 6–20)
BUN: 8 mg/dL (ref 6–20)
CO2: 23 mmol/L (ref 22–32)
CO2: 21 mmol/L — ABNORMAL LOW (ref 22–32)
Calcium: 9.2 mg/dL (ref 8.9–10.3)
Calcium: 9 mg/dL (ref 8.9–10.3)
Chloride: 104 mmol/L (ref 98–111)
Chloride: 102 mmol/L (ref 98–111)
Creatinine, Ser: 0.64 mg/dL (ref 0.44–1.00)
Creatinine, Ser: 0.7 mg/dL (ref 0.44–1.00)
GFR, Estimated: >60 mL/min (ref >60)
GFR, Estimated: >60 mL/min (ref >60)
Glucose, Bld: 128 mg/dL — ABNORMAL HIGH (ref 70–99)
Glucose, Bld: 121 mg/dL — ABNORMAL HIGH (ref 70–99)
Potassium: 3.9 mmol/L (ref 3.5–5.1)
Potassium: 3.4 mmol/L — ABNORMAL LOW (ref 3.5–5.1)
Sodium: 136 mmol/L (ref 135–145)
Sodium: 135 mmol/L (ref 135–145)
Total Bilirubin: 0.3 mg/dL (ref <1.2)
Total Bilirubin: 0.4 mg/dL (ref <1.2)
Total Protein: 8 g/dL (ref 6.5–8.1)
Total Protein: 7.4 g/dL (ref 6.5–8.1)

## 2023-04-02 LAB — RAPID URINE DRUG SCREEN, HOSP PERFORMED
Amphetamines: NOT DETECTED
Amphetamines: NOT DETECTED
Barbiturates: NOT DETECTED
Barbiturates: NOT DETECTED
Benzodiazepines: NOT DETECTED
Benzodiazepines: NOT DETECTED
Cocaine: NOT DETECTED
Cocaine: NOT DETECTED
Opiates: NOT DETECTED
Opiates: NOT DETECTED
Tetrahydrocannabinol: NOT DETECTED
Tetrahydrocannabinol: NOT DETECTED

## 2023-04-02 LAB — CBC
HCT: 37.9 % (ref 36.0–46.0)
Hemoglobin: 12.2 g/dL (ref 12.0–15.0)
MCH: 27.2 pg (ref 26.0–34.0)
MCHC: 32.2 g/dL (ref 30.0–36.0)
MCV: 84.6 fL (ref 80.0–100.0)
Platelets: 432 10*3/uL — ABNORMAL HIGH (ref 150–400)
RBC: 4.48 MIL/uL (ref 3.87–5.11)
RDW: 16.2 % — ABNORMAL HIGH (ref 11.5–15.5)
WBC: 15.8 10*3/uL — ABNORMAL HIGH (ref 4.0–10.5)
nRBC: 0 % (ref 0.0–0.2)

## 2023-04-02 LAB — CBC WITH DIFFERENTIAL/PLATELET
Abs Immature Granulocytes: 0.06 10*3/uL (ref 0.00–0.07)
Basophils Absolute: 0.1 10*3/uL (ref 0.0–0.1)
Basophils Relative: 1 %
Eosinophils Absolute: 0.2 10*3/uL (ref 0.0–0.5)
Eosinophils Relative: 1 %
HCT: 38.7 % (ref 36.0–46.0)
Hemoglobin: 12.4 g/dL (ref 12.0–15.0)
Immature Granulocytes: 1 %
Lymphocytes Relative: 28 %
Lymphs Abs: 3.5 10*3/uL (ref 0.7–4.0)
MCH: 27.3 pg (ref 26.0–34.0)
MCHC: 32 g/dL (ref 30.0–36.0)
MCV: 85.1 fL (ref 80.0–100.0)
Monocytes Absolute: 0.8 10*3/uL (ref 0.1–1.0)
Monocytes Relative: 7 %
Neutro Abs: 7.9 10*3/uL — ABNORMAL HIGH (ref 1.7–7.7)
Neutrophils Relative %: 62 %
Platelets: 458 10*3/uL — ABNORMAL HIGH (ref 150–400)
RBC: 4.55 MIL/uL (ref 3.87–5.11)
RDW: 16.1 % — ABNORMAL HIGH (ref 11.5–15.5)
WBC: 12.5 10*3/uL — ABNORMAL HIGH (ref 4.0–10.5)
nRBC: 0 % (ref 0.0–0.2)

## 2023-04-02 LAB — HCG, SERUM, QUALITATIVE
Preg, Serum: NEGATIVE
Preg, Serum: NEGATIVE

## 2023-04-02 LAB — ETHANOL
Alcohol, Ethyl (B): <10 mg/dL (ref <10)
Alcohol, Ethyl (B): <10 mg/dL (ref <10)

## 2023-04-02 LAB — SALICYLATE LEVEL: Salicylate Lvl: <7 mg/dL — ABNORMAL LOW (ref 7.0–30.0)

## 2023-04-02 LAB — ACETAMINOPHEN LEVEL: Acetaminophen (Tylenol), Serum: <10 ug/mL — ABNORMAL LOW (ref 10–30)

## 2023-04-02 MED ORDER — OLANZAPINE 10 MG PO TABS: 10.0000 mg | ORAL_TABLET | Freq: Every day | ORAL | 0 refills | Status: AC

## 2023-04-02 MED ORDER — DIVALPROEX SODIUM ER 500 MG PO TB24
500.0000 mg | ORAL_TABLET | Freq: Once | ORAL | Status: AC
  Administered 2023-04-02: 500 mg via ORAL
  Filled 2023-04-02: qty 1

## 2023-04-02 MED ORDER — ONDANSETRON 4 MG PO TBDP
4.0000 mg | ORAL_TABLET | Freq: Once | ORAL | Status: AC
  Administered 2023-04-03: 4 mg via ORAL
  Filled 2023-04-02: qty 1

## 2023-04-02 MED ORDER — OLANZAPINE 10 MG PO TABS
10.0000 mg | ORAL_TABLET | Freq: Once | ORAL | Status: AC
  Administered 2023-04-02: 10 mg via ORAL
  Filled 2023-04-02: qty 1

## 2023-04-02 MED ORDER — DOCUSATE SODIUM 100 MG PO CAPS
100.0000 mg | ORAL_CAPSULE | Freq: Once | ORAL | Status: AC
  Administered 2023-04-02: 100 mg via ORAL
  Filled 2023-04-02: qty 1

## 2023-04-02 MED ORDER — DIVALPROEX SODIUM ER 500 MG PO TB24: 500.0000 mg | ORAL_TABLET | Freq: Every day | ORAL | 0 refills | Status: AC

## 2023-04-02 NOTE — ED Notes (Signed)
Pt wanded by security. 

## 2023-04-02 NOTE — ED Provider Notes (Signed)
[place] EMERGENCY DEPARTMENT AT [facility] Provider Note   CSN: [id number] Arrival date & time: 04/02/23  2138     History  Chief Complaint  Patient presents with   out of meds    Molly [name] is a 37 y.o. female.  The history is provided by the patient and medical records.   37 year old female with history of schizoaffective disorder, medication noncompliance, diabetes, substance-induced mood disorder, presenting to the ED with complaint of nausea and difficulty with bowel movements.  States she ate some bo' rounds from [employer] this morning that she thinks were bad so she has felt nauseated all day.  She has not had any active emesis.  Also reports difficulty with bowel movements.  States she was only able to produce "2 small doo doo balls" today.  She states she feels like she needs to go more but can't.  She is requesting to "break up the food" in her stomach.  No medication taken prior to arrival.  Home Medications Prior to Admission medications   Medication Sig Start Date End Date Taking? Authorizing Provider  acetaminophen (TYLENOL) 500 MG tablet Take 500-1,000 mg by mouth every 6 (six) hours as needed for mild pain (pain score 1-3) or headache.    [provider]  albuterol (VENTOLIN HFA) 108 (90 Base) MCG/ACT inhaler Inhale 1-2 puffs into the lungs every 4 (four) hours as needed for wheezing or shortness of breath.    [provider]  divalproex (DEPAKOTE ER) 500 MG 24 hr tablet Take 1 tablet (500 mg total) by mouth daily. 04/02/23   Sabas Sous, MD  fluPHENAZine (PROLIXIN) 10 MG tablet Take 10 mg by mouth at bedtime. 05/25/22   [provider]  fluPHENAZine decanoate (PROLIXIN) 25 MG/ML injection Inject 25 mg into the muscle every 21 ( twenty-one) days.    [provider]  lithium carbonate (LITHOBID) 300 MG ER tablet Take 300 mg by mouth 2 (two) times daily. 12/17/22   [provider]  OLANZapine (ZYPREXA) 10 MG  tablet Take 1 tablet (10 mg total) by mouth at bedtime. 04/02/23   Sabas Sous, MD      Allergies    Abilify [aripiprazole]    Review of Systems   Review of Systems  Gastrointestinal:  Positive for constipation and nausea.  All other systems reviewed and are negative.   Physical Exam Updated Vital Signs BP (!) 149/101 (BP Location: Right Arm)   Pulse 100   Temp 98 F (36.7 C) (Oral)   Resp 18   Ht 5\' 8"  (1.727 m)   Wt 117 kg   LMP 03/26/2023 (Exact Date)   SpO2 100%   BMI 39.22 kg/m   Physical Exam Vitals and nursing note reviewed.  Constitutional:      Appearance: She is well-developed.  HENT:     Head: Normocephalic and atraumatic.  Eyes:     Conjunctiva/sclera: Conjunctivae normal.     Pupils: Pupils are equal, round, and reactive to light.  Cardiovascular:     Rate and Rhythm: Normal rate and regular rhythm.     Heart sounds: Normal heart sounds.  Pulmonary:     Effort: Pulmonary effort is normal.     Breath sounds: Normal breath sounds.  Abdominal:     General: Bowel sounds are normal.     Palpations: Abdomen is soft.     Tenderness: There is no guarding.     Hernia: No hernia is present.  Musculoskeletal:  General: Normal range of motion.     Cervical back: Normal range of motion.  Skin:    General: Skin is warm and dry.  Neurological:     Mental Status: She is alert and oriented to person, place, and time.  Psychiatric:     Comments: Conversant, able to stay on topic and provide history, does not appear to be responding to internal stimuli, no reported SI/HI     ED Results / Procedures / Treatments   Labs (all labs ordered are listed, but only abnormal results are displayed) Labs Reviewed  COMPREHENSIVE METABOLIC PANEL - Abnormal; Notable for the following components:      Result Value   Potassium 3.4 (*)    CO2 21 (*)    Glucose, Bld 121 (*)    All other components within normal limits  SALICYLATE LEVEL - Abnormal; Notable for the  following components:   Salicylate Lvl <7.0 (*)    All other components within normal limits  ACETAMINOPHEN LEVEL - Abnormal; Notable for the following components:   Acetaminophen (Tylenol), Serum <10 (*)    All other components within normal limits  CBC - Abnormal; Notable for the following components:   WBC 15.8 (*)    RDW 16.2 (*)    Platelets 432 (*)    All other components within normal limits  ETHANOL  RAPID URINE DRUG SCREEN, HOSP PERFORMED  HCG, SERUM, QUALITATIVE    EKG None  Radiology No results found.  Procedures Procedures    Medications Ordered in ED Medications  docusate sodium (COLACE) capsule 100 mg (100 mg Oral Given 04/02/23 2359)  ondansetron (ZOFRAN-ODT) disintegrating tablet 4 mg (4 mg Oral Given 04/03/23 0000)    ED Course/ Medical Decision Making/ A&P                                 Medical Decision Making Risk OTC drugs. Prescription drug management.   37 year old female presenting to the ED complaining of nausea and difficulty with bowel movements.  She was seen in the ED 2 days ago for same.  She is well-known to ED staff, has baseline psychiatric history.  She is awake and alert today, conversant, able to give adequate history.  She does not appear to be responding to internal stimuli.  She denies any SI/HI.  Labs were obtained and are grossly reassuring.  She requesting something for nausea and to "clean out her stomach"-- given zofran and colace.  Patient gathered her belongings and ambulated to the ED prior to being given her d/c paperwork and did not allow d/c vitals.  Final Clinical Impression(s) / ED Diagnoses Final diagnoses:  Nausea    Rx / DC Orders ED Discharge Orders     None         Garlon Hatchet, PA-C 04/03/23 0051    Royanne Foots, DO 04/09/23 1055

## 2023-04-02 NOTE — ED Notes (Signed)
Pt changed out into purple scrubs. Pt belongings are itemized and placed in small lockers 1,2, and 3 in purple. Pt has 5 bags of belongings that have accompanied her to the ED tonight. Pt is currently sleeping in triage 2B.

## 2023-04-02 NOTE — Discharge Instructions (Signed)
You were evaluated in the Emergency Department and after careful evaluation, we did not find any emergent condition requiring admission or further testing in the hospital.  Your exam/testing today is overall reassuring.  Take your Depakote and olanzapine medications as prescribed, follow-up with behavioral health.  Please return to the Emergency Department if you experience any worsening of your condition.   Thank you for allowing [place] to be a part of your care.

## 2023-04-02 NOTE — ED Provider Notes (Signed)
[facility] Emergency Department Provider Note MRN:  [id number]  Arrival date & time: 04/02/23     Chief Complaint   Psychiatric Evaluation   History of Present Illness   Molly [name] is a 37 y.o. year-old female with a history of schizophrenia presenting to the ED with chief complaint of psychiatric evaluation.  Patient is concerned about her hallucinations.  Sees a person but does not really see the person, thinks the person is herself talking to herself.  Says that hallucinations have been going on since she was 37 years old and are unchanged.  Occasional suicidal thoughts but not recently and has been having these thoughts since she was 37 years old.  Explains that her main issue is that she ran out of her psychiatric medications and has been feeling less in control, less well since that time.  Ran out Monday.  Review of Systems  A thorough review of systems was obtained and all systems are negative except as noted in the HPI and PMH.   Patient's Health History    Past Medical History:  Diagnosis Date   Abnormal Pap smear    ASC-cannot exclude HGSIL on Pap 02/15/2012   ASC-US on 02/03/2012 pap (associated Trichomonas infection). No reflex HPV testing performed on specimen.  Patient informed that she will need repeat Pap in one year.       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 Dennie Bible (Psych) At [facility] in  [place], [place]. 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   Qualifier: Diagnosis of  By: McDiarmid MD, Benjaman Pott USER 02/08/2009   Qualifier: Diagnosis of  By: Knox Royalty      Past Surgical History:  Procedure Laterality Date   INCISION AND DRAINAGE     pilanodal cyst   TIBIA IM NAIL INSERTION Right 10/26/2021   Procedure: INTRAMEDULLARY (IM) NAIL TIBIAL;  Surgeon: Myrene Galas, MD;  Location: [facility];  Service: Orthopedics;  Laterality: Right;   TOOTH EXTRACTION N/A 10/07/2017   Procedure: EXTRACTION TEETH NUMBERS ONE, SEVENTEEN, NINETEEN AND THIRTY TWO;  Surgeon: Ocie Doyne, DDS;  Location: [facility] OR;  Service: Oral Surgery;  Laterality: N/A;    Family History  Adopted: Yes  Problem Relation Age of Onset   Bipolar disorder Sister    Alcohol abuse Brother    Cancer Father    Diabetes Mother     Social History   Socioeconomic History   Marital status: Single    Spouse name: Not on file   Number of children: Not on file   Years of education: Not on file   Highest education level: Not on file  Occupational History   Not on file  Tobacco Use   Smoking status: Every Day    Current packs/day: 2.00    Average packs/day: 2.0 packs/day for 10.0  years (20.0 ttl pk-yrs)    Types: Cigarettes   Smokeless tobacco: Never  Vaping Use   Vaping status: Never Used  Substance and Sexual Activity   Alcohol use: No    Comment: occ   Drug use: No   Sexual activity: Yes    Birth control/protection: Injection  Other Topics Concern   Not on file  Social History Narrative   Adopted   Has Guardian   Living in [place] home with Bradly Chris   Transportation: Bus   Social Determinants of Health   Financial Resource Strain: Unknown (03/28/2020)   Received from [employer], [facility]   Overall Financial Resource Strain (CARDIA)    Difficulty of Paying Living Expenses: Patient declined  Food Insecurity: High Risk (05/26/2022)   [facility]    Hunger Vital Sign    Worried About Running Out of Food in the Last Year: Often true    Within the past 12 months, the food you bought just didn't last and you didn't have money to get more: Not on file  Transportation Needs: Unmet Transportation Needs (05/26/2022)   [facility]   Transportation    In the past 12 months, has lack of reliable transportation kept you from medical appointments, meetings, work or from getting things needed for daily living? : Yes  Physical Activity: Not on file  Stress: No Stress Concern Present (03/28/2020)   Received from [facility], [facility]   [employer] of Occupational Health - Occupational Stress Questionnaire    Feeling of Stress : Not at all  Social Connections: Unknown (08/31/2021)   Received from [facility], [facility]   Social Network    Social Network: Not on file  Intimate Partner Violence: Low Risk  (05/26/2022)   Received from [facility] visits prior to 07/03/2022.   Safety    How often does anyone, including family and friends, physically hurt you?: Never    How often does anyone, including family and friends, insult or talk down to you?: Never    How often does anyone, including family and friends, threaten you with harm?: Never    How often does anyone, including family and friends, scream or curse at you?: Never     Physical Exam   Vitals:   04/01/23 2340  BP: (!) 145/107  Pulse: (!) 129  Resp: 18  Temp: 98.3 F (36.8 C)  SpO2: 100%    CONSTITUTIONAL: Well-appearing, NAD NEURO/PSYCH:  Alert and oriented x 3, no focal deficits EYES:  eyes equal and reactive ENT/NECK:  no LAD, no JVD CARDIO: Regular rate, well-perfused, normal S1 and S2 PULM:  CTAB no wheezing or rhonchi GI/GU:  non-distended, non-tender MSK/SPINE:  No gross deformities, no edema SKIN:  no rash, atraumatic   *Additional and/or pertinent findings included in MDM below  Diagnostic and  Interventional Summary    EKG Interpretation Date/Time:  Saturday April 02 2023 00:01:45 EST Ventricular Rate:  111 PR Interval:  138 QRS Duration:  68 QT Interval:  336 QTC Calculation: 456 R Axis:   46  Text Interpretation: Sinus tachycardia Otherwise normal ECG When compared with ECG of 04-Jul-2022 17:24, PREVIOUS ECG IS PRESENT Confirmed by Kennis Carina [id number]) on 04/02/2023 3:46:58 AM       Labs Reviewed  COMPREHENSIVE METABOLIC PANEL - Abnormal; Notable for the following components:      Result Value   Glucose, Bld 128 (*)    All  other components within normal limits  CBC WITH DIFFERENTIAL/PLATELET - Abnormal; Notable for the following components:   WBC 12.5 (*)    RDW 16.1 (*)    Platelets 458 (*)    Neutro Abs 7.9 (*)    All other components within normal limits  ETHANOL  RAPID URINE DRUG SCREEN, HOSP PERFORMED  HCG, SERUM, QUALITATIVE    No orders to display    Medications  OLANZapine (ZYPREXA) tablet 10 mg (has no administration in time range)  divalproex (DEPAKOTE ER) 24 hr tablet 500 mg (has no administration in time range)     Procedures  /  Critical Care Procedures  ED Course and Medical Decision Making  Initial Impression and Ddx Patient is calm and cooperative, lucid, not in an active psychosis.  Seems to be feeling generally unwell since running out of her Depakote and antipsychotic.  I asked her multiple times and she does not have any significant changes to her symptoms, nothing to suggest she is in a psychiatric or medical crisis at this time.  She feels that she would be safe to go home if she were represcribed her medicines.  Past medical/surgical history that increases complexity of ED encounter: Schizophrenia  Interpretation of Diagnostics I personally reviewed the laboratory assessment and my interpretation is as follows: No significant blood count or electrolyte disturbance    Patient Reassessment and Ultimate Disposition/Management      Heart rate on my repeat evaluation is in the 90s, was tachycardic in triage.  She is contracted for safety and will return with any worsening of symptoms.  Appropriate for discharge.  Patient management required discussion with the following services or consulting groups:  None  Complexity of Problems Addressed Acute illness or injury that poses threat of life of bodily function  Additional Data Reviewed and Analyzed Further history obtained from: Prior labs/imaging results  Additional Factors Impacting ED Encounter Risk Prescriptions  Elmer Sow. Pilar Plate, MD [facility] [email or web address]  Final Clinical Impressions(s) / ED Diagnoses     ICD-10-CM   1. Hallucination  R44.3       ED Discharge Orders          Ordered    divalproex (DEPAKOTE ER) 500 MG 24 hr tablet  Daily        04/02/23 0420    OLANZapine (ZYPREXA) 10 MG tablet  Daily at bedtime        04/02/23 0420             Discharge Instructions Discussed with and Provided to Patient:    Discharge Instructions      You were evaluated in the Emergency Department and after careful evaluation, we did not find any emergent condition requiring admission or further testing in the hospital.  Your exam/testing today is overall reassuring.  Take your Depakote and olanzapine medications as prescribed, follow-up with behavioral health.  Please return to the Emergency Department if you experience any worsening of your condition.   Thank you for allowing [place] to be a part of your care.      Sabas Sous, MD 04/02/23 [phone]

## 2023-04-02 NOTE — ED Notes (Signed)
Refused vitals; said they are leaving anyway

## 2023-04-02 NOTE — ED Triage Notes (Signed)
The pt has many complaints not eating much today out of her meds her feet are hurting she is homeless

## 2023-04-02 NOTE — ED Notes (Signed)
Pt belongings placed in locker 11 ?

## 2023-04-02 NOTE — ED Notes (Signed)
BH folder with triage RN. Pt has a blanket baby that will accompany her to her room in the ED.

## 2023-04-03 ENCOUNTER — Encounter (HOSPITAL_COMMUNITY): Payer: Self-pay

## 2023-04-03 ENCOUNTER — Emergency Department (HOSPITAL_COMMUNITY)
Admission: EM | Admit: 2023-04-03 | Discharge: 2023-04-03 | Disposition: A | Discharge status: Home / Self Care | Payer: 59 | Source: Home / Self Care | Attending: Emergency Medicine | Admitting: Emergency Medicine

## 2023-04-03 ENCOUNTER — Emergency Department (HOSPITAL_COMMUNITY)
Admission: EM | Admit: 2023-04-03 | Discharge: 2023-04-04 | Disposition: A | Discharge status: Home / Self Care | Payer: 59 | Attending: Emergency Medicine | Admitting: Emergency Medicine

## 2023-04-03 ENCOUNTER — Other Ambulatory Visit: Payer: Self-pay

## 2023-04-03 ENCOUNTER — Encounter (HOSPITAL_COMMUNITY): Payer: Self-pay | Admitting: *Deleted

## 2023-04-03 DIAGNOSIS — Z7951 Long term (current) use of inhaled steroids: Secondary | ICD-10-CM | POA: Insufficient documentation

## 2023-04-03 DIAGNOSIS — Z765 Malingerer [conscious simulation]: Secondary | ICD-10-CM | POA: Insufficient documentation

## 2023-04-03 DIAGNOSIS — F17210 Nicotine dependence, cigarettes, uncomplicated: Secondary | ICD-10-CM | POA: Insufficient documentation

## 2023-04-03 DIAGNOSIS — Z5900 Homelessness unspecified: Secondary | ICD-10-CM | POA: Insufficient documentation

## 2023-04-03 DIAGNOSIS — I10 Essential (primary) hypertension: Secondary | ICD-10-CM | POA: Insufficient documentation

## 2023-04-03 DIAGNOSIS — E119 Type 2 diabetes mellitus without complications: Secondary | ICD-10-CM | POA: Insufficient documentation

## 2023-04-03 DIAGNOSIS — F17200 Nicotine dependence, unspecified, uncomplicated: Secondary | ICD-10-CM | POA: Insufficient documentation

## 2023-04-03 DIAGNOSIS — R110 Nausea: Secondary | ICD-10-CM | POA: Diagnosis not present

## 2023-04-03 DIAGNOSIS — J45909 Unspecified asthma, uncomplicated: Secondary | ICD-10-CM | POA: Insufficient documentation

## 2023-04-03 DIAGNOSIS — Z79899 Other long term (current) drug therapy: Secondary | ICD-10-CM | POA: Insufficient documentation

## 2023-04-03 DIAGNOSIS — B349 Viral infection, unspecified: Secondary | ICD-10-CM | POA: Insufficient documentation

## 2023-04-03 MED ORDER — LITHIUM CARBONATE ER 300 MG PO TBCR
300.0000 mg | EXTENDED_RELEASE_TABLET | Freq: Once | ORAL | Status: AC
  Administered 2023-04-03: 300 mg via ORAL
  Filled 2023-04-03: qty 1

## 2023-04-03 MED ORDER — OLANZAPINE 10 MG PO TABS
10.0000 mg | ORAL_TABLET | Freq: Every day | ORAL | Status: DC
  Administered 2023-04-03: 10 mg via ORAL
  Filled 2023-04-03: qty 1

## 2023-04-03 MED ORDER — DIVALPROEX SODIUM 250 MG PO DR TAB
500.0000 mg | DELAYED_RELEASE_TABLET | Freq: Once | ORAL | Status: AC
  Administered 2023-04-03: 500 mg via ORAL
  Filled 2023-04-03: qty 2

## 2023-04-03 MED ORDER — IBUPROFEN 400 MG PO TABS: 600.0000 mg | ORAL_TABLET | Freq: Once | ORAL | Status: DC

## 2023-04-03 MED ORDER — ACETAMINOPHEN 500 MG PO TABS
1000.0000 mg | ORAL_TABLET | Freq: Once | ORAL | Status: AC
  Administered 2023-04-03: 1000 mg via ORAL
  Filled 2023-04-03: qty 2

## 2023-04-03 NOTE — ED Notes (Signed)
Pt left after knocking down a chair and violently moving the table; security was called and she saw herself out with directions.

## 2023-04-03 NOTE — ED Notes (Signed)
Patient removed BP cuff, pulse ox and threw them on the floor

## 2023-04-03 NOTE — ED Provider Notes (Signed)
[place] EMERGENCY DEPARTMENT AT [facility] Provider Note   CSN: [id number] Arrival date & time: 04/03/23  1936     History  Chief Complaint  Patient presents with   URI    Molly [name] is a 37 y.o. female with PMH as listed below who presents c/o flu-like symptoms that started today. Pt d/c from ED this morning. On evaluation she does not answer most questions but instead asks for her home medications. Also asks for tylenol for headache and sore throat. She does not answer further questions such as CP, SOB.    Past Medical History:  Diagnosis Date   Abnormal Pap smear    ASC-cannot exclude HGSIL on Pap 02/15/2012   ASC-US on 02/03/2012 pap (associated Trichomonas infection). No reflex HPV testing performed on specimen.  Patient informed that she will need repeat Pap in one year.       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 Dennie Bible (Psych) At [facility] in  [place], [place]. 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 Authorizing Provider  acetaminophen (TYLENOL) 500 MG tablet Take 500-1,000 mg by mouth every 6 (six) hours as needed for mild pain (pain score 1-3) or headache.    [provider]   albuterol (VENTOLIN HFA) 108 (90 Base) MCG/ACT inhaler Inhale 1-2 puffs into the lungs every 4 (four) hours as needed for wheezing or shortness of breath.    [provider]  divalproex (DEPAKOTE ER) 500 MG 24 hr tablet Take 1 tablet (500 mg total) by mouth daily. 04/02/23   Sabas Sous, MD  fluPHENAZine (PROLIXIN) 10 MG tablet Take 10 mg by mouth at bedtime. 05/25/22   [provider]  fluPHENAZine decanoate (PROLIXIN) 25 MG/ML injection Inject 25 mg into the muscle every 21 ( twenty-one) days.    [provider]  lithium carbonate (LITHOBID) 300 MG ER tablet Take 300 mg by mouth 2 (two) times daily. 12/17/22   [provider]  OLANZapine (ZYPREXA) 10 MG tablet Take 1 tablet (10 mg total) by mouth at bedtime. 04/02/23   Sabas Sous, MD      Allergies    Abilify [aripiprazole]    Review of Systems   Review of Systems A 10 point review of systems was performed and is negative unless otherwise reported in HPI.  Physical Exam Updated Vital Signs BP (!) 149/109 (BP Location: Right Arm)   Pulse (!) 129   Temp 97.9 F (36.6 C) (Oral)   Resp 16   Ht 5\' 8"  (1.727 m)   Wt 120.2 kg  LMP 03/26/2023 (Exact Date)   SpO2 100%   BMI 40.29 kg/m  Physical Exam General: Normal appearing female, lying in bed.  HEENT: Sclera anicteric, MMM, trachea midline.  Cardiology: RRR, no murmurs/rubs/gallops.  Resp: Normal respiratory rate and effort. CTAB, no wheezes, rhonchi, crackles.  Abd: Soft, non-tender, non-distended. No rebound tenderness or guarding.  GU: Deferred. MSK: No peripheral edema or signs of trauma. Skin: warm, dry.  Neuro: Alert, uncooperative with orientation questions, CNs II-XII grossly intact. MAEs. Sensation grossly intact.  Psych: Flat affect  ED Results / Procedures / Treatments   Labs (all labs ordered are listed, but only abnormal results are displayed) Labs Reviewed  RESP PANEL BY RT-PCR (RSV, FLU A&B, COVID)  [id number]     EKG None  Radiology No results found.  Procedures Procedures    Medications Ordered in ED Medications  acetaminophen (TYLENOL) tablet 1,000 mg (1,000 mg Oral Given 04/03/23 2231)  divalproex (DEPAKOTE) DR tablet 500 mg (500 mg Oral Given 04/03/23 2231)  lithium carbonate (LITHOBID) ER tablet 300 mg (300 mg Oral Given 04/03/23 2232)    ED Course/ Medical Decision Making/ A&P                          Medical Decision Making Risk OTC drugs. Prescription drug management.     MDM:    Patient is given her home medications as requested. She has no resp distress or increased WOB. She is uncooperative but does not appear to be responding to internal stimuli and intermittently answers questions appropriately. Patient is stable for discharge. I had ordered her home meds including tylenol, depakote, lithium, and zyprexa, but prior to receiving the zyprexa, she left the ED. RN notified legal guardian.      Additional history obtained from chart review.    Social Determinants of Health: Homeless  Disposition:  DC   Co morbidities that complicate the patient evaluation  Past Medical History:  Diagnosis Date   Abnormal Pap smear    ASC-cannot exclude HGSIL on Pap 02/15/2012   ASC-US on 02/03/2012 pap (associated Trichomonas infection). No reflex HPV testing performed on specimen.  Patient informed that she will need repeat Pap in one year.       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 Dennie Bible  (Psych) At [facility] in  [place], [place]. 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   I have reviewed the patients home medicines and have made adjustments as needed  Problem List / ED Course: Problem List Items Addressed This Visit   None Visit Diagnoses  Viral syndrome    -  Primary                   This note was created using dictation software, which may contain spelling or grammatical errors.    Loetta Rough, MD 04/10/23 1106

## 2023-04-03 NOTE — Discharge Instructions (Signed)
Pick up your meds from pharmacy-- they were sent in yesterday. Follow-up with your doctor. Return here for new concerns.

## 2023-04-03 NOTE — ED Triage Notes (Signed)
The pt had blood drawn earlier and she did this same thing last night after she was discharged she signed back in

## 2023-04-03 NOTE — ED Triage Notes (Signed)
Pt to ED c/o cold symptoms that started today. Pt d/c from ED this morning.

## 2023-04-03 NOTE — ED Provider Notes (Signed)
  [place] EMERGENCY DEPARTMENT AT [facility] Provider Note   CSN: [id number] Arrival date & time: 04/03/23  0116     History  No chief complaint on file.   Pheobe Baymon is a 37 y.o. female.  Patient was just seen and discharged, checked back in stating that she vomited after discharge.       Home Medications Prior to Admission medications   Medication Sig Start Date End Date Taking? Authorizing Provider  acetaminophen (TYLENOL) 500 MG tablet Take 500-1,000 mg by mouth every 6 (six) hours as needed for mild pain (pain score 1-3) or headache.    [provider]  albuterol (VENTOLIN HFA) 108 (90 Base) MCG/ACT inhaler Inhale 1-2 puffs into the lungs every 4 (four) hours as needed for wheezing or shortness of breath.    [provider]  divalproex (DEPAKOTE ER) 500 MG 24 hr tablet Take 1 tablet (500 mg total) by mouth daily. 04/02/23   Sabas Sous, MD  fluPHENAZine (PROLIXIN) 10 MG tablet Take 10 mg by mouth at bedtime. 05/25/22   [provider]  fluPHENAZine decanoate (PROLIXIN) 25 MG/ML injection Inject 25 mg into the muscle every 21 ( twenty-one) days.    [provider]  lithium carbonate (LITHOBID) 300 MG ER tablet Take 300 mg by mouth 2 (two) times daily. 12/17/22   [provider]  OLANZapine (ZYPREXA) 10 MG tablet Take 1 tablet (10 mg total) by mouth at bedtime. 04/02/23   Sabas Sous, MD      Allergies    Abilify [aripiprazole]    Review of Systems   Review of Systems  Physical Exam Updated Vital Signs BP (!) 142/101 (BP Location: Right Arm)   Pulse (!) 101   Temp 98.6 F (37 C) (Oral)   Resp 18   Ht 5\' 8"  (1.727 m)   Wt 117 kg   LMP 03/26/2023 (Exact Date)   SpO2 100%   BMI 39.22 kg/m  Physical Exam Constitutional:      Appearance: Normal appearance.  HENT:     Head: Atraumatic.  Pulmonary:     Effort: Pulmonary effort is normal.  Musculoskeletal:        General: Normal range of motion.   Neurological:     General: No focal deficit present.     Mental Status: She is alert and oriented to person, place, and time.     ED Results / Procedures / Treatments   Labs (all labs ordered are listed, but only abnormal results are displayed) Labs Reviewed - No data to display  EKG None  Radiology No results found.  Procedures Procedures    Medications Ordered in ED Medications - No data to display  ED Course/ Medical Decision Making/ A&P                                 Medical Decision Making  Patient eating [employer] when I encountered her.  Uncooperative with exam.  Appears well, does not require any further evaluation.        Final Clinical Impression(s) / ED Diagnoses Final diagnoses:  Malingering    Rx / DC Orders ED Discharge Orders     None         Clint Strupp, Canary Brim, MD 04/03/23 [phone]

## 2023-04-03 NOTE — ED Notes (Addendum)
Patient would not allow this RN to complete vital signs. Patient walked out prior to receiving discharge papers.

## 2023-04-03 NOTE — ED Triage Notes (Signed)
The pt was just discharged from this ed and she checked back in  she reports that she vomited after she left here no vomiting now

## 2023-04-03 NOTE — ED Notes (Signed)
Pt refusing resp panel at this time.

## 2023-04-03 NOTE — ED Notes (Signed)
Legal guardian notified of patient walking out of ED.

## 2023-04-04 ENCOUNTER — Other Ambulatory Visit: Payer: Self-pay

## 2023-04-04 ENCOUNTER — Emergency Department (HOSPITAL_COMMUNITY)
Admission: EM | Admit: 2023-04-04 | Discharge: 2023-04-04 | Disposition: A | Discharge status: Home / Self Care | Payer: 59 | Attending: Emergency Medicine | Admitting: Emergency Medicine

## 2023-04-04 ENCOUNTER — Encounter (HOSPITAL_COMMUNITY): Payer: Self-pay | Admitting: *Deleted

## 2023-04-04 DIAGNOSIS — I10 Essential (primary) hypertension: Secondary | ICD-10-CM | POA: Insufficient documentation

## 2023-04-04 DIAGNOSIS — Z01818 Encounter for other preprocedural examination: Secondary | ICD-10-CM | POA: Insufficient documentation

## 2023-04-04 DIAGNOSIS — Z79899 Other long term (current) drug therapy: Secondary | ICD-10-CM | POA: Insufficient documentation

## 2023-04-04 DIAGNOSIS — E119 Type 2 diabetes mellitus without complications: Secondary | ICD-10-CM | POA: Diagnosis not present

## 2023-04-04 DIAGNOSIS — F600 Paranoid personality disorder: Secondary | ICD-10-CM | POA: Insufficient documentation

## 2023-04-04 DIAGNOSIS — Z91148 Patient's other noncompliance with medication regimen for other reason: Secondary | ICD-10-CM

## 2023-04-04 DIAGNOSIS — R440 Auditory hallucinations: Secondary | ICD-10-CM | POA: Insufficient documentation

## 2023-04-04 DIAGNOSIS — R441 Visual hallucinations: Secondary | ICD-10-CM | POA: Insufficient documentation

## 2023-04-04 DIAGNOSIS — F209 Schizophrenia, unspecified: Secondary | ICD-10-CM | POA: Diagnosis present

## 2023-04-04 DIAGNOSIS — R451 Restlessness and agitation: Secondary | ICD-10-CM | POA: Diagnosis not present

## 2023-04-04 MED ORDER — DIVALPROEX SODIUM ER 500 MG PO TB24
500.0000 mg | ORAL_TABLET | Freq: Once | ORAL | Status: AC
  Administered 2023-04-04: 500 mg via ORAL
  Filled 2023-04-04: qty 1

## 2023-04-04 MED ORDER — OLANZAPINE 10 MG PO TABS
10.0000 mg | ORAL_TABLET | Freq: Once | ORAL | Status: AC
  Administered 2023-04-04: 10 mg via ORAL
  Filled 2023-04-04: qty 1

## 2023-04-04 NOTE — Discharge Instructions (Signed)
You were evaluated in the Emergency Department and after careful evaluation, we did not find any emergent condition requiring admission or further testing in the hospital.  Your exam/testing today was overall reassuring.  Please return to the Emergency Department if you experience any worsening of your condition.  Thank you for allowing us to be a part of your care.  

## 2023-04-04 NOTE — ED Triage Notes (Signed)
Patient arrives by EMS from [employer] [facility] parking lot. Patient met truck on its arrival to the parking lot and requested transport to the hospital. Patient arrived at the hospital and is verbally aggressive with staff and EMS. Patient has hx of same.

## 2023-04-04 NOTE — ED Provider Notes (Signed)
[place] EMERGENCY DEPARTMENT AT [facility] Provider Note   CSN: [id number] Arrival date & time: 04/04/23  1707     History  Chief Complaint  Patient presents with   Medical Clearance    Molly [name] is a 37 y.o. female who presents the emergency department without specific complaint.  Patient with significant psychiatric history.  Per EMS, patient met the truck on arrival and requested transport to the hospital for unknown reason.  She was noted to be verbally aggressive with staff and EMS.  She does have history of the same.  She reports to me that she has not had her psychiatric medications in several days.  She intermittently contributes to history, otherwise stares off not speaking.  She request that we consult her legal guardian and her strategic intervention team.  She denies suicidal homicidal nation.  She endorses visual and auditory hallucinations, which appear to be her baseline.    HPI     Home Medications Prior to Admission medications   Medication Sig Start Date End Date Taking? Authorizing Provider  acetaminophen (TYLENOL) 500 MG tablet Take 500-1,000 mg by mouth every 6 (six) hours as needed for mild pain (pain score 1-3) or headache.    [provider]  albuterol (VENTOLIN HFA) 108 (90 Base) MCG/ACT inhaler Inhale 1-2 puffs into the lungs every 4 (four) hours as needed for wheezing or shortness of breath.    [provider]  divalproex (DEPAKOTE ER) 500 MG 24 hr tablet Take 1 tablet (500 mg total) by mouth daily. 04/02/23   Sabas Sous, MD  fluPHENAZine (PROLIXIN) 10 MG tablet Take 10 mg by mouth at bedtime. 05/25/22   [provider]  fluPHENAZine decanoate (PROLIXIN) 25 MG/ML injection Inject 25 mg into the muscle every 21 ( twenty-one) days.    [provider]  lithium carbonate (LITHOBID) 300 MG ER tablet Take 300 mg by mouth 2 (two) times daily. 12/17/22   [provider]  OLANZapine (ZYPREXA) 10 MG  tablet Take 1 tablet (10 mg total) by mouth at bedtime. 04/02/23   Sabas Sous, MD      Allergies    Abilify [aripiprazole]    Review of Systems   Review of Systems  Psychiatric/Behavioral:  Positive for agitation and hallucinations.   All other systems reviewed and are negative.   Physical Exam Updated Vital Signs BP (!) 148/99 (BP Location: Right Arm)   Pulse (!) 103   Temp 98.2 F (36.8 C)   Resp 16   LMP 03/26/2023 (Exact Date)   SpO2 100%  Physical Exam Vitals and nursing note reviewed.  Constitutional:      Appearance: Normal appearance.  HENT:     Head: Normocephalic and atraumatic.  Eyes:     Conjunctiva/sclera: Conjunctivae normal.  Pulmonary:     Effort: Pulmonary effort is normal. No respiratory distress.  Skin:    General: Skin is warm and dry.  Neurological:     Mental Status: She is alert.  Psychiatric:        Attention and Perception: She perceives auditory and visual hallucinations.        Mood and Affect: Mood normal. Affect is labile.        Speech: Speech is delayed.        Behavior: Behavior is agitated and aggressive.        Thought Content: Thought content is paranoid. Thought content does not include homicidal or suicidal ideation.     ED Results /  Procedures / Treatments   Labs (all labs ordered are listed, but only abnormal results are displayed) Labs Reviewed - No data to display  EKG None  Radiology No results found.  Procedures Procedures    Medications Ordered in ED Medications  OLANZapine (ZYPREXA) tablet 10 mg (10 mg Oral Given 04/04/23 1830)  divalproex (DEPAKOTE ER) 24 hr tablet 500 mg (500 mg Oral Given 04/04/23 1831)    ED Course/ Medical Decision Making/ A&P Clinical Course as of 04/04/23 1927  Mon Apr 04, 2023  1748 Contacted patient's legal guardian and case worker Insurance account manager.  She states that she has not been able to locate the patient for several days.  Her and several members of the patient's ACT team  have been trying to find her to dispense her medications.  She has been without her medications for almost a week. [LR]  1758 Tried contacting ACT worker [place], no answer. Tried contacting ACT worker Viviann Spare, no answer.  [LR]  1759 Contacted [employer] Interventions crisis line, to be forwarded on to on Secretary/administrator [LR]    Clinical Course User Index [LR] Oneal Biglow, Lora Paula, PA-C                                 Medical Decision Making Risk Prescription drug management.   Patient is a 37 year old female with history of diabetes, schizoaffective disorder, disorganized schizophrenia, depression, hypertension, who presents the emergency department without specific complaint.  She states that she has been off her psychiatric medications for several days.  She does endorse visual and auditory hallucinations, but denies SI or HI.  On exam patient is mildly hypertensive, otherwise normal vital signs.  No acute distress.  She is intermittently aggressive with staff.   I contacted her legal guardian and she states that they have not been able to locate the patient for several days to administer her medications.  She recommended that I follow-up with her ACT team.  I tried contacting several workers, eventually heard back.  They appreciate knowing where she is, but states that this has been a frequent problem for them.  I will order a dose of her home medications, and strongly recommend that she try to follow-up with her care teams.  Patient discharged in stable condition.  No indication for emergent psychiatric evaluation.  Patient escorted off the premises by security.  Final Clinical Impression(s) / ED Diagnoses Final diagnoses:  Schizophrenia, unspecified type (HCC)  Noncompliance with medications    Rx / DC Orders ED Discharge Orders     None      Portions of this report may have been transcribed using voice recognition software. Every effort was made to ensure accuracy; however,  inadvertent computerized transcription errors may be present.    Su Monks, PA-C 04/04/23 1927    Rexford Maus, DO 04/04/23 2039

## 2023-04-04 NOTE — ED Notes (Signed)
Patient continues to be labile and switch between cooperative and aggressive with staff.

## 2023-04-04 NOTE — ED Notes (Signed)
Ambulated out of department independently.

## 2023-04-04 NOTE — ED Provider Notes (Signed)
[facility] Emergency Department Provider Note MRN:  [id number]  Arrival date & time: 04/04/23     Chief Complaint   Malaise History of Present Illness   Molly [name] is a 37 y.o. year-old female with history of schizophrenia presenting to the ED with chief complaint of malaise.  Patient says that she generally does not feel good.  Declines to offer further information, falls back asleep.  Review of Systems  A thorough review of systems was obtained and all systems are negative except as noted in the HPI and PMH.   Patient's Health History    Past Medical History:  Diagnosis Date   Abnormal Pap smear    ASC-cannot exclude HGSIL on Pap 02/15/2012   ASC-US on 02/03/2012 pap (associated Trichomonas infection). No reflex HPV testing performed on specimen.  Patient informed that she will need repeat Pap in one year.       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 Dennie Bible (Psych) At [facility] in  [place], [place]. 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   Qualifier: Diagnosis of  By: McDiarmid MD, Benjaman Pott USER 02/08/2009   Qualifier: Diagnosis of  By: Knox Royalty      Past Surgical History:  Procedure Laterality Date   INCISION AND DRAINAGE     pilanodal cyst   TIBIA IM NAIL INSERTION Right 10/26/2021   Procedure: INTRAMEDULLARY (IM) NAIL TIBIAL;  Surgeon: Myrene Galas, MD;  Location: [facility] OR;  Service:  Orthopedics;  Laterality: Right;   TOOTH EXTRACTION N/A 10/07/2017   Procedure: EXTRACTION TEETH NUMBERS ONE, SEVENTEEN, NINETEEN AND THIRTY TWO;  Surgeon: Ocie Doyne, DDS;  Location: [facility] OR;  Service: Oral Surgery;  Laterality: N/A;    Family History  Adopted: Yes  Problem Relation Age of Onset   Bipolar disorder Sister    Alcohol abuse Brother    Cancer Father    Diabetes Mother     Social History   Socioeconomic History   Marital status: Single    Spouse name: Not on file   Number of children: Not on file   Years of education: Not on file   Highest education level: Not on file  Occupational History   Not on file  Tobacco Use   Smoking status: Every Day    Current packs/day: 2.00    Average packs/day: 2.0 packs/day for 10.0 years (20.0 ttl pk-yrs)    Types: Cigarettes   Smokeless tobacco: Never  Vaping Use   Vaping status: Never Used  Substance and Sexual Activity   Alcohol use: No    Comment: occ   Drug use: No   Sexual activity: Yes    Birth control/protection: Injection  Other Topics Concern   Not on file  Social History Narrative   Adopted   Has Guardian   Living in [place]  home with Bradly Chris   Transportation: Bus   Social Determinants of Health   Financial Resource Strain: Unknown (03/28/2020)   [facility] from [facility]   Overall Financial Resource Strain (CARDIA)    Difficulty of Paying Living Expenses: Patient declined  Food Insecurity: High Risk (05/26/2022)   [facility]   Hunger Vital Sign    Worried About Running Out of Food in the Last Year: Often true    Within the past 12 months, the food you bought just didn't last and you didn't have money to get more: Not on file  Transportation Needs: Unmet Transportation Needs (05/26/2022)   [facility]   Transportation    In the past 12 months, has lack of reliable transportation kept you from medical appointments,  meetings, work or from getting things needed for daily living? : Yes  Physical Activity: Not on file  Stress: No Stress Concern Present (03/28/2020)   Received from [facility], [facility]   [employer] of Occupational Health - Occupational Stress Questionnaire    Feeling of Stress : Not at all  Social Connections: Unknown (08/31/2021)   Received from [facility], [facility]   Social Network    Social Network: Not on file  Intimate Partner Violence: Low Risk  (05/26/2022)   Received from [facility] visits prior to 07/03/2022.   Safety    How often does anyone, including family and friends, physically hurt you?: Never    How often does anyone, including family and friends, insult or talk down to you?: Never    How often does anyone, including family and friends, threaten you with harm?: Never    How often does anyone, including family and friends, scream or curse at you?: Never     Physical Exam   Vitals:   04/04/23 0000  BP: (!) 134/96  Pulse: (!) 111  Resp: 16  Temp: 98.3 F (36.8 C)  SpO2: 100%    CONSTITUTIONAL: Chronically ill-appearing, NAD NEURO/PSYCH: Sleeping comfortably, wakes easily, answers questions when interested EYES:  eyes equal and reactive ENT/NECK:  no LAD, no JVD CARDIO: Regular rate, well-perfused, normal S1 and S2 PULM:  CTAB no wheezing or rhonchi GI/GU:  non-distended, non-tender MSK/SPINE:  No gross deformities, no edema SKIN:  no rash, atraumatic   *Additional and/or pertinent findings included in MDM below  Diagnostic and Interventional Summary    EKG Interpretation Date/Time:    Ventricular Rate:    PR Interval:    QRS Duration:    QT Interval:    QTC Calculation:   R Axis:      Text Interpretation:         Labs Reviewed - No data to display  No orders to display    Medications - No data to display   Procedures  /  Critical Care Procedures  ED Course and Medical Decision Making  Initial  Impression and Ddx This will be the patient's sixth emergency department visit in the past 3 days, all for symptoms of general malaise.  Highly suspicious visiting the emergency department for secondary gain.  Patient is not interested in participating in exam or history taking, rolls over and goes back to sleep.  Past medical/surgical history that increases complexity of ED encounter: Schizophrenia  Interpretation of Diagnostics Laboratory and/or imaging options to aid in the diagnosis/care of the patient were considered.  After careful history and physical examination, it was determined  that there was no indication for diagnostics at this time.  Patient Reassessment and Ultimate Disposition/Management     Given patient's reassuring exam and vital signs she is appropriate for discharge.  Patient management required discussion with the following services or consulting groups:  None  Complexity of Problems Addressed Acute complicated illness or Injury  Additional Data Reviewed and Analyzed Further history obtained from: None  Additional Factors Impacting ED Encounter Risk None  Elmer Sow. Pilar Plate, MD [facility] Emergency Medicine [facility] [email or web address]  Final Clinical Impressions(s) / ED Diagnoses     ICD-10-CM   1. Malingering  Z76.5       ED Discharge Orders     None        Discharge Instructions Discussed with and Provided to Patient:    Discharge Instructions      You were evaluated in the Emergency Department and after careful evaluation, we did not find any emergent condition requiring admission or further testing in the hospital.  Your exam/testing today was overall reassuring.  Please return to the Emergency Department if you experience any worsening of your condition.  Thank you for allowing [place] to be a part of your care.       Sabas Sous, MD 04/04/23 [phone]

## 2023-04-04 NOTE — ED Triage Notes (Signed)
This pt comes each night and sometimes days she is discharged as soon as she is seen then she goes back out to registration and checks in again  this is the 3rd day that she has done this

## 2023-04-04 NOTE — Discharge Instructions (Addendum)
I contacted your case manager and your ACT team. They have not been able to locate you for several days to give you your medications. Please try to return to your designated meeting points as soon as possible.

## 2023-04-05 ENCOUNTER — Other Ambulatory Visit: Payer: Self-pay

## 2023-04-05 ENCOUNTER — Emergency Department (HOSPITAL_COMMUNITY)
Admission: EM | Admit: 2023-04-05 | Discharge: 2023-04-05 | Discharge status: Against Medical Advice | Payer: 59 | Attending: Emergency Medicine | Admitting: Emergency Medicine

## 2023-04-05 DIAGNOSIS — Z5321 Procedure and treatment not carried out due to patient leaving prior to being seen by health care provider: Secondary | ICD-10-CM | POA: Insufficient documentation

## 2023-04-05 DIAGNOSIS — M25579 Pain in unspecified ankle and joints of unspecified foot: Secondary | ICD-10-CM | POA: Diagnosis not present

## 2023-04-05 DIAGNOSIS — R102 Pelvic and perineal pain: Secondary | ICD-10-CM | POA: Diagnosis present

## 2023-04-05 NOTE — ED Notes (Signed)
This NT was wheeling the pt back to [facility] she jumped up out of the wheel chair and started running back down the hallway screaming no.

## 2023-04-05 NOTE — ED Triage Notes (Signed)
Pt presents via EMS c/o vaginal pain and foot pain.   Pt well known to this ED.

## 2023-04-10 ENCOUNTER — Encounter (HOSPITAL_COMMUNITY): Payer: Self-pay

## 2023-04-10 ENCOUNTER — Other Ambulatory Visit: Payer: Self-pay

## 2023-04-10 ENCOUNTER — Emergency Department (HOSPITAL_COMMUNITY)
Admission: EM | Admit: 2023-04-10 | Discharge: 2023-04-11 | Disposition: A | Discharge status: Home / Self Care | Payer: 59 | Attending: Emergency Medicine | Admitting: Emergency Medicine

## 2023-04-10 DIAGNOSIS — R52 Pain, unspecified: Secondary | ICD-10-CM | POA: Insufficient documentation

## 2023-04-10 DIAGNOSIS — Z5900 Homelessness unspecified: Secondary | ICD-10-CM | POA: Diagnosis present

## 2023-04-10 MED ORDER — ACETAMINOPHEN 325 MG PO TABS
650.0000 mg | ORAL_TABLET | Freq: Once | ORAL | Status: AC
  Administered 2023-04-10: 650 mg via ORAL
  Filled 2023-04-10: qty 2
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
## 2023-04-10 NOTE — ED Notes (Signed)
Food and drink given.

## 2023-04-10 NOTE — ED Provider Notes (Signed)
[place] EMERGENCY DEPARTMENT AT [facility] Provider Note   CSN: [id number] Arrival date & time: 04/10/23  1824     History  No chief complaint on file.   Molly [name] is a 37 y.o. female.  Patient here asking for a place to sleep and eat and drink.  She is having some generalized pain but denies any trauma or assault.  She denies any weakness numbness tingling.  No chest pain or shortness of breath.  The history is provided by the patient.       Home Medications Prior to Admission medications   Medication Sig Start Date End Date Taking? Authorizing Provider  acetaminophen (TYLENOL) 500 MG tablet Take 500-1,000 mg by mouth every 6 (six) hours as needed for mild pain (pain score 1-3) or headache.    [provider]  albuterol (VENTOLIN HFA) 108 (90 Base) MCG/ACT inhaler Inhale 1-2 puffs into the lungs every 4 (four) hours as needed for wheezing or shortness of breath.    [provider]  divalproex (DEPAKOTE ER) 500 MG 24 hr tablet Take 1 tablet (500 mg total) by mouth daily. 04/02/23   Sabas Sous, MD  fluPHENAZine (PROLIXIN) 10 MG tablet Take 10 mg by mouth at bedtime. 05/25/22   [provider]  fluPHENAZine decanoate (PROLIXIN) 25 MG/ML injection Inject 25 mg into the muscle every 21 ( twenty-one) days.    [provider]  lithium carbonate (LITHOBID) 300 MG ER tablet Take 300 mg by mouth 2 (two) times daily. 12/17/22   [provider]  OLANZapine (ZYPREXA) 10 MG tablet Take 1 tablet (10 mg total) by mouth at bedtime. 04/02/23   Sabas Sous, MD      Allergies    Abilify [aripiprazole]    Review of Systems   Review of Systems  Physical Exam Updated Vital Signs BP (!) 150/92 (BP Location: Left Arm)   Pulse (!) 126   Temp 98.7 F (37.1 C) (Oral)   Resp 18   LMP 03/26/2023 (Exact Date)   SpO2 99%  Physical Exam Vitals and nursing note reviewed.  Constitutional:      General: She is not in acute  distress.    Appearance: She is well-developed. She is not ill-appearing.  HENT:     Head: Normocephalic and atraumatic.     Nose: Nose normal.     Mouth/Throat:     Mouth: Mucous membranes are moist.  Eyes:     Extraocular Movements: Extraocular movements intact.     Conjunctiva/sclera: Conjunctivae normal.     Pupils: Pupils are equal, round, and reactive to light.  Cardiovascular:     Rate and Rhythm: Normal rate and regular rhythm.     Pulses: Normal pulses.     Heart sounds: Normal heart sounds. No murmur heard. Pulmonary:     Effort: Pulmonary effort is normal. No respiratory distress.     Breath sounds: Normal breath sounds.  Abdominal:     Palpations: Abdomen is soft.     Tenderness: There is no abdominal tenderness.  Musculoskeletal:        General: No swelling.     Cervical back: Normal range of motion and neck supple.  Skin:    General: Skin is warm and dry.     Capillary Refill: Capillary refill takes less than 2 seconds.  Neurological:     General: No focal deficit present.     Mental Status: She is alert and oriented to person, place, and time.  Cranial Nerves: No cranial nerve deficit.     Sensory: No sensory deficit.     Motor: No weakness.  Psychiatric:        Mood and Affect: Mood normal.     ED Results / Procedures / Treatments   Labs (all labs ordered are listed, but only abnormal results are displayed) Labs Reviewed - No data to display  EKG None  Radiology No results found.  Procedures Procedures    Medications Ordered in ED Medications  acetaminophen (TYLENOL) tablet 650 mg (650 mg Oral Given 04/10/23 1922)    ED Course/ Medical Decision Making/ A&P                                 Medical Decision Making Risk OTC drugs.   [name] is here for social issue.  She admits that she is just here for some sleep and food.  She is homeless living in the streets.  She is not having any new pain or other acute problem.  No chest  pain or shortness of breath.  She is neurologically intact on exam.  She moves all extremities.  She answers questions appropriately.  She denies any alcohol or drugs.  Overall she was given something to eat and drink and allowed to rest.  Patient to be discharged.  Given resources for shelters.  This chart was dictated using voice recognition software.  Despite best efforts to proofread,  errors can occur which can change the documentation meaning.         Final Clinical Impression(s) / ED Diagnoses Final diagnoses:  Homelessness    Rx / DC Orders ED Discharge Orders     None         Virgina Norfolk, DO 04/10/23 2239

## 2023-04-10 NOTE — ED Notes (Signed)
Pt was sleeping  in bed in hallway when staff noted she had urinated on herself. Pt with strong smell of urine. Taken to room 12 where 2 staff members, myself and another female nurse tech helped patient remove soiled clothing. Pt wearing multiple pairs of very tight underwear which had to be cut off due to patient screaming in pain when she tried to take them off. Pt cleaned her groin area with soapy wash cloths provided. Given clean mesh underwear and clean paper pants. Staff changed soiled bedsheets and given fresh linen.

## 2023-04-12 ENCOUNTER — Encounter (HOSPITAL_COMMUNITY): Payer: Self-pay

## 2023-04-12 ENCOUNTER — Emergency Department (HOSPITAL_COMMUNITY)
Admission: EM | Admit: 2023-04-12 | Discharge: 2023-04-12 | Disposition: A | Discharge status: Home / Self Care | Payer: 59 | Attending: Emergency Medicine | Admitting: Emergency Medicine

## 2023-04-12 ENCOUNTER — Other Ambulatory Visit: Payer: Self-pay

## 2023-04-12 ENCOUNTER — Emergency Department (HOSPITAL_COMMUNITY)
Admission: EM | Admit: 2023-04-12 | Discharge: 2023-04-13 | Disposition: A | Discharge status: Home / Self Care | Payer: 59 | Attending: Emergency Medicine | Admitting: Emergency Medicine

## 2023-04-12 DIAGNOSIS — Z760 Encounter for issue of repeat prescription: Secondary | ICD-10-CM | POA: Diagnosis not present

## 2023-04-12 DIAGNOSIS — Z5900 Homelessness unspecified: Secondary | ICD-10-CM | POA: Insufficient documentation

## 2023-04-12 DIAGNOSIS — R4689 Other symptoms and signs involving appearance and behavior: Secondary | ICD-10-CM | POA: Diagnosis present

## 2023-04-12 NOTE — ED Provider Notes (Signed)
[place] EMERGENCY DEPARTMENT AT [facility] Provider Note   CSN: [id number] Arrival date & time: 04/12/23  1144     History  Chief Complaint  Patient presents with   Medication Refill    Molly [name] is a 37 y.o. female with history of homelessness and 23 ED visits in the past 6 months including 11 visits in the past 14 days came in with no complaints. When this provider asked why patient is here patient stated she needed to get out of the rain. Triage note stated patient is here for med refill however patient denied this. Patient denied chest pain, shob, abd pain, any changes since she was seen 2 days ago.  Home Medications Prior to Admission medications   Medication Sig Start Date End Date Taking? Authorizing Provider  acetaminophen (TYLENOL) 500 MG tablet Take 500-1,000 mg by mouth every 6 (six) hours as needed for mild pain (pain score 1-3) or headache.    [provider]  albuterol (VENTOLIN HFA) 108 (90 Base) MCG/ACT inhaler Inhale 1-2 puffs into the lungs every 4 (four) hours as needed for wheezing or shortness of breath.    [provider]  divalproex (DEPAKOTE ER) 500 MG 24 hr tablet Take 1 tablet (500 mg total) by mouth daily. 04/02/23   Sabas Sous, MD  fluPHENAZine (PROLIXIN) 10 MG tablet Take 10 mg by mouth at bedtime. 05/25/22   [provider]  fluPHENAZine decanoate (PROLIXIN) 25 MG/ML injection Inject 25 mg into the muscle every 21 ( twenty-one) days.    [provider]  lithium carbonate (LITHOBID) 300 MG ER tablet Take 300 mg by mouth 2 (two) times daily. 12/17/22   [provider]  OLANZapine (ZYPREXA) 10 MG tablet Take 1 tablet (10 mg total) by mouth at bedtime. 04/02/23   Sabas Sous, MD      Allergies    Abilify [aripiprazole]    Review of Systems   Review of Systems  Physical Exam Updated Vital Signs BP (!) 156/104   Pulse (!) 115   Temp 97.9 F (36.6 C) (Oral)   Resp 16   LMP 03/26/2023  (Exact Date)   SpO2 100%  Physical Exam Vitals reviewed.  Constitutional:      General: She is not in acute distress.    Comments: Trash bag of belongings at bedside  HENT:     Head: Normocephalic and atraumatic.  Eyes:     Extraocular Movements: Extraocular movements intact.     Conjunctiva/sclera: Conjunctivae normal.     Pupils: Pupils are equal, round, and reactive to light.  Cardiovascular:     Rate and Rhythm: Normal rate and regular rhythm.     Pulses: Normal pulses.     Heart sounds: Normal heart sounds.     Comments: 2+ bilateral radial/dorsalis pedis pulses with regular rate Pulmonary:     Effort: Pulmonary effort is normal. No respiratory distress.     Breath sounds: Normal breath sounds.  Abdominal:     Palpations: Abdomen is soft.     Tenderness: There is no abdominal tenderness. There is no guarding or rebound.  Musculoskeletal:        General: Normal range of motion.     Cervical back: Normal range of motion and neck supple.     Comments: 5 out of 5 bilateral grip/leg extension strength  Skin:    General: Skin is warm and dry.     Capillary Refill: Capillary refill takes less than 2 seconds.  Neurological:     General: No focal deficit present.     Mental Status: She is alert and oriented to person, place, and time.     Comments: Sensation intact in all 4 limbs  Psychiatric:        Mood and Affect: Mood normal.     ED Results / Procedures / Treatments   Labs (all labs ordered are listed, but only abnormal results are displayed) Labs Reviewed - No data to display  EKG None  Radiology No results found.  Procedures Procedures    Medications Ordered in ED Medications - No data to display  ED Course/ Medical Decision Making/ A&P                                 Medical Decision Making  Molly [name] 37 y.o. presented today for homelessness. Working DDx that I considered at this time includes, but not limited to, malingering.  Review of prior  external notes: 04/10/23 ED  Unique Tests and My Interpretation: none  Social Determinants of Health: homeless  Discussion with Independent Historian: None  Discussion of Management of Tests: None  Risk: Low: based on diagnostic testing/clinical impression and treatment plan  Risk Stratification Score: none  Plan: On exam patient was no acute distress with stable vitals. EMR states patient was tachycardic however she was not tachycardic when I evaluated her. Patient's exam was unremarkable and patient stated she was trying to find a place out of the rain and had no complaints at this time. Patient has had multiple visits to ED recently for same reason and does have history of malingering and so at this time doubt life threatening event that would require workup at this time. Triage note states she wanted med refill however patient denied this with me and so unsure of what medications she was referring to with triage staff.  Patient was given return precautions. Patient stable for discharge at this time.  Patient verbalized understanding of plan.  This chart was dictated using voice recognition software.  Despite best efforts to proofread,  errors can occur which can change the documentation meaning.         Final Clinical Impression(s) / ED Diagnoses Final diagnoses:  Homeless    Rx / DC Orders ED Discharge Orders     None         Remi Deter 04/12/23 1237    Coral Spikes, DO 04/12/23 1358

## 2023-04-12 NOTE — ED Notes (Signed)
Ive called 3 different numbers and no response and left a message with all 3 numbers Left a message all 3 times.  - Molly [name] [phone]

## 2023-04-12 NOTE — ED Triage Notes (Signed)
Pt dropped off by [employer] for psych evaluation. Pt not answering questions and just stares at RN during triage. Pt did shake her head,no when asked if she had any pain.

## 2023-04-12 NOTE — Discharge Instructions (Signed)
Please follow up with your PCP and if symptoms change or worsen please return to the ER.

## 2023-04-12 NOTE — ED Provider Notes (Signed)
[place] EMERGENCY DEPARTMENT AT [facility] Provider Note   CSN: [id number] Arrival date & time: 04/12/23  2249     History {Add pertinent medical, surgical, social history, OB history to HPI:1} Chief Complaint  Patient presents with   Psychiatric Evaluation    Molly [name] is a 37 y.o. female.  The history is provided by the patient and medical records.   37 year old female with history of schizophrenia, diabetes, substance-induced mood disorder, presenting to the ED for wandering around outside all day.  She reports "I think I got separated from my body".   Home Medications Prior to Admission medications   Medication Sig Start Date End Date Taking? Authorizing Provider  acetaminophen (TYLENOL) 500 MG tablet Take 500-1,000 mg by mouth every 6 (six) hours as needed for mild pain (pain score 1-3) or headache.    [provider]  albuterol (VENTOLIN HFA) 108 (90 Base) MCG/ACT inhaler Inhale 1-2 puffs into the lungs every 4 (four) hours as needed for wheezing or shortness of breath.    [provider]  divalproex (DEPAKOTE ER) 500 MG 24 hr tablet Take 1 tablet (500 mg total) by mouth daily. 04/02/23   Sabas Sous, MD  fluPHENAZine (PROLIXIN) 10 MG tablet Take 10 mg by mouth at bedtime. 05/25/22   [provider]  fluPHENAZine decanoate (PROLIXIN) 25 MG/ML injection Inject 25 mg into the muscle every 21 ( twenty-one) days.    [provider]  lithium carbonate (LITHOBID) 300 MG ER tablet Take 300 mg by mouth 2 (two) times daily. 12/17/22   [provider]  OLANZapine (ZYPREXA) 10 MG tablet Take 1 tablet (10 mg total) by mouth at bedtime. 04/02/23   Sabas Sous, MD      Allergies    Abilify [aripiprazole]    Review of Systems   Review of Systems  Psychiatric/Behavioral:         Homeless  All other systems reviewed and are negative.   Physical Exam Updated Vital Signs BP (!) 139/97   Pulse (!) 124   Temp 98.3  F (36.8 C)   Resp 18   LMP 03/26/2023 (Exact Date)   SpO2 96%   Physical Exam Vitals and nursing note reviewed.  Constitutional:      Appearance: She is well-developed.     Comments: Clothing and hair is wet  HENT:     Head: Normocephalic and atraumatic.  Eyes:     Conjunctiva/sclera: Conjunctivae normal.     Pupils: Pupils are equal, round, and reactive to light.  Cardiovascular:     Rate and Rhythm: Normal rate and regular rhythm.     Heart sounds: Normal heart sounds.  Pulmonary:     Effort: Pulmonary effort is normal.     Breath sounds: Normal breath sounds.  Abdominal:     General: Bowel sounds are normal.     Palpations: Abdomen is soft.  Musculoskeletal:        General: Normal range of motion.     Cervical back: Normal range of motion.  Skin:    General: Skin is warm and dry.  Neurological:     Mental Status: She is alert and oriented to person, place, and time.  Psychiatric:     Comments: Disorganized thoughts, appears baseline for patient     ED Results / Procedures / Treatments   Labs (all labs ordered are listed, but only abnormal results are displayed) Labs Reviewed - No data to display  EKG None  Radiology No results found.  Procedures Procedures  {Document cardiac monitor, telemetry assessment procedure when appropriate:1}  Medications Ordered in ED Medications - No data to display  ED Course/ Medical Decision Making/ A&P   {   Click here for ABCD2, HEART and other calculatorsREFRESH Note before signing :1}                              Medical Decision Making    Final Clinical Impression(s) / ED Diagnoses Final diagnoses:  None    Rx / DC Orders ED Discharge Orders     None

## 2023-04-12 NOTE — ED Triage Notes (Signed)
Pt is coming in for a medication refill, she mentions being out of her psych medication. She has no other complaints at this time and she is accompanied with a trash bag full of her belongings. She mentions no chest pain, no shortness of breath, and no other pain complaints.

## 2023-04-14 ENCOUNTER — Emergency Department (HOSPITAL_COMMUNITY)
Admission: EM | Admit: 2023-04-14 | Discharge: 2023-04-14 | Discharge status: Against Medical Advice | Payer: 59 | Attending: Emergency Medicine | Admitting: Emergency Medicine

## 2023-04-14 ENCOUNTER — Encounter (HOSPITAL_COMMUNITY): Payer: Self-pay | Admitting: Emergency Medicine

## 2023-04-14 ENCOUNTER — Emergency Department (HOSPITAL_COMMUNITY)
Admission: EM | Admit: 2023-04-14 | Discharge: 2023-04-15 | Disposition: A | Discharge status: Home / Self Care | Payer: 59 | Source: Home / Self Care | Attending: Emergency Medicine | Admitting: Emergency Medicine

## 2023-04-14 ENCOUNTER — Emergency Department (HOSPITAL_COMMUNITY)
Admission: EM | Admit: 2023-04-14 | Discharge: 2023-04-14 | Discharge status: Against Medical Advice | Payer: 59 | Source: Home / Self Care

## 2023-04-14 ENCOUNTER — Other Ambulatory Visit: Payer: Self-pay

## 2023-04-14 ENCOUNTER — Emergency Department (HOSPITAL_COMMUNITY): Payer: 59

## 2023-04-14 DIAGNOSIS — Z5900 Homelessness unspecified: Secondary | ICD-10-CM | POA: Insufficient documentation

## 2023-04-14 DIAGNOSIS — E119 Type 2 diabetes mellitus without complications: Secondary | ICD-10-CM | POA: Diagnosis not present

## 2023-04-14 DIAGNOSIS — M79641 Pain in right hand: Secondary | ICD-10-CM | POA: Diagnosis not present

## 2023-04-14 DIAGNOSIS — R079 Chest pain, unspecified: Secondary | ICD-10-CM | POA: Insufficient documentation

## 2023-04-14 DIAGNOSIS — Z8659 Personal history of other mental and behavioral disorders: Secondary | ICD-10-CM

## 2023-04-14 DIAGNOSIS — R4182 Altered mental status, unspecified: Secondary | ICD-10-CM | POA: Insufficient documentation

## 2023-04-14 DIAGNOSIS — X31XXXA Exposure to excessive natural cold, initial encounter: Secondary | ICD-10-CM | POA: Diagnosis not present

## 2023-04-14 DIAGNOSIS — R2230 Localized swelling, mass and lump, unspecified upper limb: Secondary | ICD-10-CM | POA: Insufficient documentation

## 2023-04-14 DIAGNOSIS — F209 Schizophrenia, unspecified: Secondary | ICD-10-CM | POA: Insufficient documentation

## 2023-04-14 DIAGNOSIS — Z5321 Procedure and treatment not carried out due to patient leaving prior to being seen by health care provider: Secondary | ICD-10-CM | POA: Insufficient documentation

## 2023-04-14 DIAGNOSIS — R000 Tachycardia, unspecified: Secondary | ICD-10-CM | POA: Insufficient documentation

## 2023-04-14 LAB — BASIC METABOLIC PANEL
Anion gap: 10 (ref 5–15)
BUN: 10 mg/dL (ref 6–20)
CO2: 23 mmol/L (ref 22–32)
Calcium: 8.6 mg/dL — ABNORMAL LOW (ref 8.9–10.3)
Chloride: 101 mmol/L (ref 98–111)
Creatinine, Ser: 0.47 mg/dL (ref 0.44–1.00)
GFR, Estimated: >60 mL/min (ref >60)
Glucose, Bld: 132 mg/dL — ABNORMAL HIGH (ref 70–99)
Potassium: 3.4 mmol/L — ABNORMAL LOW (ref 3.5–5.1)
Sodium: 134 mmol/L — ABNORMAL LOW (ref 135–145)

## 2023-04-14 LAB — CBC
HCT: 34.9 % — ABNORMAL LOW (ref 36.0–46.0)
Hemoglobin: 11.8 g/dL — ABNORMAL LOW (ref 12.0–15.0)
MCH: 28.8 pg (ref 26.0–34.0)
MCHC: 33.8 g/dL (ref 30.0–36.0)
MCV: 85.1 fL (ref 80.0–100.0)
Platelets: 401 10*3/uL — ABNORMAL HIGH (ref 150–400)
RBC: 4.1 MIL/uL (ref 3.87–5.11)
RDW: 16 % — ABNORMAL HIGH (ref 11.5–15.5)
WBC: 12.9 10*3/uL — ABNORMAL HIGH (ref 4.0–10.5)
nRBC: 0 % (ref 0.0–0.2)

## 2023-04-14 LAB — TROPONIN I (HIGH SENSITIVITY): Troponin I (High Sensitivity): 12 ng/L (ref <18)

## 2023-04-14 NOTE — ED Notes (Signed)
Xray tech attempted to take pt for imaging. Pt declined, would not situate as asked. Rad tech will attempt again if there is additional staff available.

## 2023-04-14 NOTE — ED Notes (Addendum)
Pt arousing to stimuli. PT refusing to get up and move to lobby. Security at bedside to assist. PT in wheelchair to waiting room.

## 2023-04-14 NOTE — Discharge Instructions (Signed)
You were seen in the emergency room today after being brought in by EMS.  Your basic labs came back reassuring and your vitals have been stable throughout stay.  You are requesting no further lab work and imaging.  Please follow-up with primary care or return to emergency room with new or worsening symptoms.

## 2023-04-14 NOTE — ED Triage Notes (Signed)
PT found asleep in a yard next to bucket of canned corn. Pt is not responsive to verbal stimuli but is responsive to painful stimuli. PT here for third time today. Breathing is unlabored.
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
## 2023-04-14 NOTE — ED Notes (Signed)
Security at bedside to assist with discharge.
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
## 2023-04-15 MED ORDER — ACETAMINOPHEN 500 MG PO TABS
1000.0000 mg | ORAL_TABLET | Freq: Once | ORAL | Status: DC
  Filled 2023-04-15: qty 2
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
## 2023-04-15 NOTE — Discharge Instructions (Signed)
You have been given a Pharmacist, hospital for local shelters. I hope this helps you find shelter.

## 2023-04-15 NOTE — ED Notes (Signed)
Attempted to do vital signs, pt. being uncooperative and not sitting up in chair to retake vital signs.

## 2023-04-18 ENCOUNTER — Emergency Department (HOSPITAL_COMMUNITY)
Admission: EM | Admit: 2023-04-18 | Discharge: 2023-04-19 | Disposition: A | Discharge status: Home / Self Care | Payer: 59 | Attending: Emergency Medicine | Admitting: Emergency Medicine

## 2023-04-18 DIAGNOSIS — M79671 Pain in right foot: Secondary | ICD-10-CM | POA: Insufficient documentation

## 2023-04-18 DIAGNOSIS — E119 Type 2 diabetes mellitus without complications: Secondary | ICD-10-CM | POA: Insufficient documentation

## 2023-04-18 DIAGNOSIS — M79672 Pain in left foot: Secondary | ICD-10-CM | POA: Diagnosis not present

## 2023-04-18 DIAGNOSIS — I10 Essential (primary) hypertension: Secondary | ICD-10-CM | POA: Insufficient documentation

## 2023-04-18 DIAGNOSIS — M79642 Pain in left hand: Secondary | ICD-10-CM | POA: Diagnosis not present

## 2023-04-18 DIAGNOSIS — M79641 Pain in right hand: Secondary | ICD-10-CM | POA: Insufficient documentation

## 2023-04-18 DIAGNOSIS — J45909 Unspecified asthma, uncomplicated: Secondary | ICD-10-CM | POA: Diagnosis not present

## 2023-04-18 NOTE — ED Notes (Signed)
Patient in [place] taking a shower; pt provided purple scrubs to put on and will return to [facility]

## 2023-04-19 ENCOUNTER — Other Ambulatory Visit: Payer: Self-pay

## 2023-04-19 ENCOUNTER — Encounter (HOSPITAL_COMMUNITY): Payer: Self-pay

## 2023-04-19 DIAGNOSIS — M79671 Pain in right foot: Secondary | ICD-10-CM | POA: Diagnosis not present

## 2023-04-19 MED ORDER — LUBRIDERM SERIOUSLY SENSITIVE EX LOTN
TOPICAL_LOTION | Freq: Two times a day (BID) | CUTANEOUS | Status: DC
  Filled 2023-04-19: qty 473

## 2023-04-19 MED ORDER — IBUPROFEN 400 MG PO TABS
600.0000 mg | ORAL_TABLET | Freq: Once | ORAL | Status: AC
  Administered 2023-04-19: 600 mg via ORAL
  Filled 2023-04-19: qty 1

## 2023-04-19 MED ORDER — ACETAMINOPHEN 500 MG PO TABS
1000.0000 mg | ORAL_TABLET | Freq: Once | ORAL | Status: AC
Start: 2023-04-19 — End: 2023-04-19
  Administered 2023-04-19: 1000 mg via ORAL
  Filled 2023-04-19: qty 2

## 2023-04-19 NOTE — ED Provider Notes (Signed)
[place] EMERGENCY DEPARTMENT AT [facility] Provider Note   CSN: [id number] Arrival date & time: 04/18/23  2022     History  Chief Complaint  Patient presents with   pain    Molly [name] is a 37 y.o. female with PMHx schizophrenia, asthma, DM, HTN, homelessness who presents to ED concerned for BL feet and BL hand pain. Patient denies any trauma leading to this pain. Patient stating that she "sometimes" walks a lot. Denying any other symptoms today.  HPI     Home Medications Prior to Admission medications   Medication Sig Start Date End Date Taking? Authorizing Provider  acetaminophen (TYLENOL) 500 MG tablet Take 500-1,000 mg by mouth every 6 (six) hours as needed for mild pain (pain score 1-3) or headache.    [provider]  albuterol (VENTOLIN HFA) 108 (90 Base) MCG/ACT inhaler Inhale 1-2 puffs into the lungs every 4 (four) hours as needed for wheezing or shortness of breath.    [provider]  divalproex (DEPAKOTE ER) 500 MG 24 hr tablet Take 1 tablet (500 mg total) by mouth daily. 04/02/23   Sabas Sous, MD  fluPHENAZine (PROLIXIN) 10 MG tablet Take 10 mg by mouth at bedtime. 05/25/22   [provider]  fluPHENAZine decanoate (PROLIXIN) 25 MG/ML injection Inject 25 mg into the muscle every 21 ( twenty-one) days.    [provider]  lithium carbonate (LITHOBID) 300 MG ER tablet Take 300 mg by mouth 2 (two) times daily. 12/17/22   [provider]  OLANZapine (ZYPREXA) 10 MG tablet Take 1 tablet (10 mg total) by mouth at bedtime. 04/02/23   Sabas Sous, MD      Allergies    Abilify [aripiprazole]    Review of Systems   Review of Systems  Musculoskeletal:        BL hand and feet pain    Physical Exam Updated Vital Signs BP 132/80 (BP Location: Right Arm)   Pulse (!) 102   Temp 98.5 F (36.9 C) (Oral)   Resp 18   Ht 5\' 8"  (1.727 m)   Wt 120.2 kg   LMP 03/26/2023 (Exact Date)   SpO2 98%   BMI 40.29  kg/m  Physical Exam Vitals and nursing note reviewed.  Constitutional:      General: She is not in acute distress.    Appearance: She is not ill-appearing or toxic-appearing.  HENT:     Head: Normocephalic and atraumatic.  Eyes:     General: No scleral icterus.       Right eye: No discharge.        Left eye: No discharge.     Conjunctiva/sclera: Conjunctivae normal.  Cardiovascular:     Rate and Rhythm: Normal rate.  Pulmonary:     Effort: Pulmonary effort is normal.  Abdominal:     General: Abdomen is flat.  Musculoskeletal:     Comments: +2 radial and pedal pulse. No increased warmth, erythema, or fluctuance. Brisk capillary refill of BL hands. Sensation to light touch intact. Area non-tense. Patient ambulatory. BL feet do look dehydrated and dry. No ulcerations or skin breakdown.  Skin:    General: Skin is warm and dry.  Neurological:     General: No focal deficit present.     Mental Status: She is alert. Mental status is at baseline.  Psychiatric:        Mood and Affect: Mood normal.        Behavior: Behavior normal.  ED Results / Procedures / Treatments   Labs (all labs ordered are listed, but only abnormal results are displayed) Labs Reviewed - No data to display  EKG None  Radiology No results found.  Procedures Procedures    Medications Ordered in ED Medications  lubriderm seriously sensitive lotion ( Topical Given 04/19/23 0953)  ibuprofen (ADVIL) tablet 600 mg (600 mg Oral Given 04/19/23 0019)  acetaminophen (TYLENOL) tablet 1,000 mg (1,000 mg Oral Given 04/19/23 1610)    ED Course/ Medical Decision Making/ A&P                                 Medical Decision Making Risk OTC drugs.   This patient presents to the ED for concern of hand and feet pain, this involves an extensive number of treatment options, and is a complaint that carries with it a high risk of complications and morbidity.  The differential diagnosis includes hemarthrosis,  gout, septic joint, fracture, tendonitis, carpal tunnel syndrome, muscle strain, bursitis, compartment syndrome   Co morbidities that complicate the patient evaluation  schizophrenia, asthma, DM, HTN,    Additional history obtained:  Multiple ED visits recently for vague body pains   Problem List / ED Course / Critical interventions / Medication management  Patient presents to ED concern for pain in bilateral hands and feet.  Denying any other symptoms today.  Patient is homeless and has been seen multiple times in the emergency room for generalized body pain.  Physical exam reassuring.  Patient's bilateral feet do appear dehydrated with crusting of skin, but is dry and without ulcerations or other concerning signs.  Patient afebrile with stable vitals.  There is very slight tachycardia which seems to be baseline for patient per chart review.  Provided patient with plenty of oral rehydration.  Patient tolerating p.o. intake very well emergency room. Patient given new socks in ED. Provided patient with pain management and lotion.  Patient ambulatory in ED.  Patient denies any recent trauma leading to her pains so I do not believe that imaging is appropriate at this time. I have reviewed the patients home medicines and have made adjustments as needed Staffed with Dr. Rhunette Croft. Patient afebrile with stable vitals.  Provided with return precautions.  Discharged in good condition.   Ddx these are considered less likely due to history of present illness and physical exam -hemarthrosis: joint without swelling; ROM intact -gout: no warmth or erythema; ROM intact  -septic joint: afebrile; no warmth or erythema; no skin changes; ROM intact  -fracture: no trauma associated with pain -compartment syndrome: area not tense; neurovascularly intact   Social Determinants of Health:  homelessness          Final Clinical Impression(s) / ED Diagnoses Final diagnoses:  Pain in both feet  Pain  in both hands    Rx / DC Orders ED Discharge Orders     None         Dorthy Cooler, [place] 04/19/23 1024    Derwood Kaplan, MD 04/19/23 1343

## 2023-04-19 NOTE — Discharge Instructions (Addendum)
It was a pleasure caring for today.  Please follow-up with your primary care provider.  Seek emergency care experiencing any new or worsening symptoms.  Alternating between 650 mg Tylenol and 400 mg Advil: The best way to alternate taking Acetaminophen (example Tylenol) and Ibuprofen (example Advil/Motrin) is to take them 3 hours apart. For example, if you take ibuprofen at 6 am you can then take Tylenol at 9 am. You can continue this regimen throughout the day, making sure you do not exceed the recommended maximum dose for each drug.

## 2023-04-19 NOTE — ED Triage Notes (Signed)
Pt here for pain all over.  Pt had long shower when she arrived d/t homelessness

## 2023-04-20 ENCOUNTER — Telehealth: Payer: Self-pay | Admitting: *Deleted

## 2023-04-20 ENCOUNTER — Encounter (HOSPITAL_COMMUNITY): Payer: Self-pay

## 2023-04-20 ENCOUNTER — Emergency Department (HOSPITAL_COMMUNITY)
Admission: EM | Admit: 2023-04-20 | Discharge: 2023-04-21 | Disposition: A | Discharge status: Home / Self Care | Payer: 59 | Attending: Emergency Medicine | Admitting: Emergency Medicine

## 2023-04-20 DIAGNOSIS — M79671 Pain in right foot: Secondary | ICD-10-CM | POA: Insufficient documentation

## 2023-04-20 DIAGNOSIS — M79672 Pain in left foot: Secondary | ICD-10-CM | POA: Insufficient documentation

## 2023-04-20 DIAGNOSIS — M79661 Pain in right lower leg: Secondary | ICD-10-CM

## 2023-04-20 NOTE — ED Provider Triage Note (Signed)
Emergency Medicine Provider Triage Evaluation Note  Molly [name] , a 37 y.o. female  was evaluated in triage.  Pt complains of leg pain and breastmilk problems.  This been an ongoing issue for some time.  Patient does believe that she is given birth to many babies and is currently pregnant right now.  Review of Systems  Positive:  Negative: See above   Physical Exam  BP (!) 148/72   Pulse 79   Temp 97.7 F (36.5 C) (Oral)   Resp 15   LMP 03/26/2023 (Exact Date)   SpO2 100%  Gen:   Awake, no distress   Resp:  Normal effort  MSK:   Moves extremities without difficulty  Other:    Medical Decision Making  Medically screening exam initiated at 9:33 PM.  Appropriate orders placed.  Molly [name] was informed that the remainder of the evaluation will be completed by another provider, this initial triage assessment does not replace that evaluation, and the importance of remaining in the ED until their evaluation is complete.     Honor Loh [place], [place] 04/20/23 2134
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
## 2023-04-20 NOTE — Telephone Encounter (Signed)
Lilly and Lanora Manis called from [facility] regarding getting pt Rx rerouted to another pharmacy for well known patient.  RNCM reviewed chart and did not find that Rx had been written for last couple of visits.  RNCM reached out to [facility], pt guardian [phone]) and left message for her to call me back.

## 2023-04-21 DIAGNOSIS — M79671 Pain in right foot: Secondary | ICD-10-CM | POA: Diagnosis not present

## 2023-04-21 MED ORDER — IBUPROFEN 800 MG PO TABS
800.0000 mg | ORAL_TABLET | Freq: Once | ORAL | Status: AC
  Administered 2023-04-21: 800 mg via ORAL
  Filled 2023-04-21: qty 1

## 2023-04-21 NOTE — Discharge Instructions (Addendum)
Please follow-up with your PCP.  If you do not have 1, please follow-up with PCP after her GJ.  Please continue taking ibuprofen for pain.

## 2023-04-21 NOTE — ED Notes (Signed)
Pt stated their fingers and feet were numb and started yelling and asking for a nurse. Triage RN, Lanora Manis was notified and spoke with pt.

## 2023-04-21 NOTE — ED Provider Notes (Signed)
[place] EMERGENCY DEPARTMENT AT [facility] Provider Note   CSN: [id number] Arrival date & time: 04/20/23  2055     History  Chief Complaint  Patient presents with   Leg Pain    Molly [name] is a 37 y.o. female who presents to ED for evaluation of bilateral foot pain.  The patient was seen yesterday for the same complaint.  She denies any preceding trauma to her feet.  She denies any chest pain or shortness of breath, fevers.  This patient is well-known to the department, this is her 20th visit the last 6 months.   Leg Pain      Home Medications Prior to Admission medications   Medication Sig Start Date End Date Taking? Authorizing Provider  acetaminophen (TYLENOL) 500 MG tablet Take 500-1,000 mg by mouth every 6 (six) hours as needed for mild pain (pain score 1-3) or headache.    [provider]  albuterol (VENTOLIN HFA) 108 (90 Base) MCG/ACT inhaler Inhale 1-2 puffs into the lungs every 4 (four) hours as needed for wheezing or shortness of breath.    [provider]  divalproex (DEPAKOTE ER) 500 MG 24 hr tablet Take 1 tablet (500 mg total) by mouth daily. 04/02/23   Sabas Sous, MD  fluPHENAZine (PROLIXIN) 10 MG tablet Take 10 mg by mouth at bedtime. 05/25/22   [provider]  fluPHENAZine decanoate (PROLIXIN) 25 MG/ML injection Inject 25 mg into the muscle every 21 ( twenty-one) days.    [provider]  lithium carbonate (LITHOBID) 300 MG ER tablet Take 300 mg by mouth 2 (two) times daily. 12/17/22   [provider]  OLANZapine (ZYPREXA) 10 MG tablet Take 1 tablet (10 mg total) by mouth at bedtime. 04/02/23   Sabas Sous, MD      Allergies    Abilify [aripiprazole]    Review of Systems   Review of Systems  Musculoskeletal:  Positive for myalgias.  All other systems reviewed and are negative.   Physical Exam Updated Vital Signs BP (!) 146/120 (BP Location: Right Arm)   Pulse (!) 105   Temp 98.8 F  (37.1 C) (Oral)   Resp 18   LMP 03/26/2023 (Exact Date)   SpO2 100%  Physical Exam Vitals and nursing note reviewed.  Constitutional:      General: She is not in acute distress.    Appearance: She is well-developed.  HENT:     Head: Normocephalic and atraumatic.  Eyes:     Conjunctiva/sclera: Conjunctivae normal.  Cardiovascular:     Rate and Rhythm: Normal rate and regular rhythm.     Heart sounds: No murmur heard. Pulmonary:     Effort: Pulmonary effort is normal. No respiratory distress.     Breath sounds: Normal breath sounds.  Abdominal:     Palpations: Abdomen is soft.     Tenderness: There is no abdominal tenderness.  Musculoskeletal:        General: No swelling.     Cervical back: Neck supple.  Skin:    General: Skin is warm and dry.     Capillary Refill: Capillary refill takes less than 2 seconds.     Comments: No redness, erythema or warmth to bilateral feet.  2+ DP pulse noted bilaterally.  Brisk capillary refill.  No signs of compartment syndrome.  Neurological:     Mental Status: She is alert.  Psychiatric:        Mood and Affect: Mood normal.  ED Results / Procedures / Treatments   Labs (all labs ordered are listed, but only abnormal results are displayed) Labs Reviewed - No data to display  EKG None  Radiology No results found.  Procedures Procedures   Medications Ordered in ED Medications  ibuprofen (ADVIL) tablet 800 mg (800 mg Oral Given 04/21/23 0557)    ED Course/ Medical Decision Making/ A&P  Medical Decision Making Risk Prescription drug management.   37 year old female presents to ED for evaluation.  This is the patient's 20th visit last 6 months.  On exam patient is afebrile, mildly tachycardic.  Lung sounds are clear bilaterally, she is nonhypoxic.  Abdomen soft compressible throughout.  Bilateral lower extremities show no swelling, erythema, warmth.  No signs of compartment syndrome.  2+ DP pulse noted bilaterally.  Brisk  capillary refill.  There are minor signs of skin breakdown consistent with calluses.  Patient has been seen for this complaint in the last 48 hours.  Patient denies any trauma to her feet, I see no reason to x-ray the patient feet.  Patient provided ibuprofen and sandwich.  Patient was given hygienic products.  Patient was discharged.   Final Clinical Impression(s) / ED Diagnoses Final diagnoses:  Pain in both lower legs    Rx / DC Orders ED Discharge Orders     None         Clent Ridges 04/21/23 0454    Maia Plan, MD 04/21/23 [phone]

## 2023-04-21 NOTE — ED Notes (Signed)
Pt stated that her feet were cold, this Nurse gave pt a pair of socks

## 2023-04-21 NOTE — ED Notes (Signed)
Upon discharge pt refused discharge vitals. Staff provided a wheelchair and pt refused to get in and leave the bed stating "I just want to take a nap". This RN informed the pt that they have been medically cleared and was up for discharge. Pt refused. Pt left facility with security.

## 2023-04-22 ENCOUNTER — Other Ambulatory Visit: Payer: Self-pay

## 2023-04-22 ENCOUNTER — Encounter (HOSPITAL_COMMUNITY): Payer: Self-pay

## 2023-04-22 ENCOUNTER — Emergency Department (HOSPITAL_COMMUNITY)
Admission: EM | Admit: 2023-04-22 | Discharge: 2023-04-22 | Disposition: A | Discharge status: Home / Self Care | Payer: 59

## 2023-04-22 DIAGNOSIS — M79671 Pain in right foot: Secondary | ICD-10-CM | POA: Diagnosis present

## 2023-04-22 DIAGNOSIS — Z5900 Homelessness unspecified: Secondary | ICD-10-CM | POA: Insufficient documentation

## 2023-04-22 DIAGNOSIS — M79672 Pain in left foot: Secondary | ICD-10-CM | POA: Diagnosis not present

## 2023-04-22 MED ORDER — ACETAMINOPHEN 325 MG PO TABS
650.0000 mg | ORAL_TABLET | Freq: Once | ORAL | Status: AC
  Administered 2023-04-22: 650 mg via ORAL
  Filled 2023-04-22: qty 2

## 2023-04-22 NOTE — ED Triage Notes (Signed)
Per EMS, pt complaining of bilateral foot pain, swelling, and discoloration. Pt able to bear weight and walk. Pt also asking for her depakote, and fluphenazine meds.

## 2023-04-22 NOTE — ED Provider Notes (Addendum)
[place] EMERGENCY DEPARTMENT AT [facility] Provider Note   CSN: [id number] Arrival date & time: 04/22/23  1834     History  pain   Molly [name] is a 37 y.o. female well-known to the emergency department with 20 visit in the last 6 months here for evaluation of pain.  Patient states earlier today she had some pain in her legs.  This resolved.  She denies any current pain however she is requesting Tylenol "so my pain does not come back."  She denies any recent falls or injuries.  No fever, nausea, vomiting, numbness or weakness.  She denies any swelling or redness to extremities  HPI     Home Medications Prior to Admission medications   Medication Sig Start Date End Date Taking? Authorizing Provider  acetaminophen (TYLENOL) 500 MG tablet Take 500-1,000 mg by mouth every 6 (six) hours as needed for mild pain (pain score 1-3) or headache.    [provider]  albuterol (VENTOLIN HFA) 108 (90 Base) MCG/ACT inhaler Inhale 1-2 puffs into the lungs every 4 (four) hours as needed for wheezing or shortness of breath.    [provider]  divalproex (DEPAKOTE ER) 500 MG 24 hr tablet Take 1 tablet (500 mg total) by mouth daily. 04/02/23   Sabas Sous, MD  fluPHENAZine (PROLIXIN) 10 MG tablet Take 10 mg by mouth at bedtime. 05/25/22   [provider]  fluPHENAZine decanoate (PROLIXIN) 25 MG/ML injection Inject 25 mg into the muscle every 21 ( twenty-one) days.    [provider]  lithium carbonate (LITHOBID) 300 MG ER tablet Take 300 mg by mouth 2 (two) times daily. 12/17/22   [provider]  OLANZapine (ZYPREXA) 10 MG tablet Take 1 tablet (10 mg total) by mouth at bedtime. 04/02/23   Sabas Sous, MD      Allergies    Abilify [aripiprazole]    Review of Systems   Review of Systems  Constitutional: Negative.   HENT: Negative.    Respiratory: Negative.    Cardiovascular: Negative.   Gastrointestinal: Negative.    Genitourinary: Negative.   Musculoskeletal:        Bilateral foot pain resolved  Neurological: Negative.   All other systems reviewed and are negative.   Physical Exam Updated Vital Signs BP (!) 176/121 (BP Location: Right Arm)   Pulse (!) 101   Temp 97.8 F (36.6 C) (Oral)   LMP 03/26/2023 (Exact Date)   SpO2 100%  Physical Exam Vitals and nursing note reviewed.  Constitutional:      General: She is not in acute distress.    Appearance: She is well-developed. She is not ill-appearing or diaphoretic.  HENT:     Head: Atraumatic.  Eyes:     Pupils: Pupils are equal, round, and reactive to light.  Cardiovascular:     Rate and Rhythm: Normal rate.  Pulmonary:     Effort: Pulmonary effort is normal. No respiratory distress.  Abdominal:     General: There is no distension.     Palpations: Abdomen is soft.  Musculoskeletal:        General: Normal range of motion.     Cervical back: Normal range of motion and neck supple.     Comments: Patient refuses for me to examine her feet.  She has no gross abnormality by visual inspection  Skin:    General: Skin is dry.  Neurological:     General: No focal deficit present.  Mental Status: She is alert.     ED Results / Procedures / Treatments   Labs (all labs ordered are listed, but only abnormal results are displayed) Labs Reviewed - No data to display  EKG None  Radiology No results found.  Procedures Procedures    Medications Ordered in ED Medications  acetaminophen (TYLENOL) tablet 650 mg (has no administration in time range)   ED Course/ Medical Decision Making/ A&P   37 year old here for evaluation of foot pain.  Patient states she had this earlier in which it self resolved.  She denies any current symptoms however would like "Tylenol so my pain does not come back."  She is well-known to the emergency department with 29 visits over the last 6 months.  Denies any recent falls or injuries.  No numbness,  weakness or swelling.  Exam limited and the patient will not allow me to have a close inspection of her extremities.  She states "you can look from there."  She has no obvious deformities to her lower extremities.  She understands limited exam given she will not let me inspect her legs.  She was given Tylenol here per her request.  Will have her follow-up outpatient, return for new or worsening symptoms  The patient has been appropriately medically screened and/or stabilized in the ED. I have low suspicion for any other emergent medical condition which would require further screening, evaluation or treatment in the ED or require inpatient management.  Patient is hemodynamically stable and in no acute distress.  Patient able to ambulate in department prior to ED.  Evaluation does not show acute pathology that would require ongoing or additional emergent interventions while in the emergency department or further inpatient treatment.  I have discussed the diagnosis with the patient and answered all questions.  Pain is been managed while in the emergency department and patient has no further complaints prior to discharge.  Patient is comfortable with plan discussed in room and is stable for discharge at this time.  I have discussed strict return precautions for returning to the emergency department.  Patient was encouraged to follow-up with PCP/specialist refer to at discharge.                                Medical Decision Making Amount and/or Complexity of Data Reviewed Independent Historian: EMS  Risk OTC drugs. Diagnosis or treatment significantly limited by social determinants of health.     ADDEND: Patient upset with discharge, she states she needs a place to sleep.  I offered resources for shelters however states she wants to stay here to sleep until morning.  I discussed that we cannot allow her to sleep here in the ED overnight.  She initially said "thank you" she however then became  aggressive with nursing staff as they attempted to discharge her, cursing.  Security was called.  She does have a history of schizophrenia however she does not appear to be actively psychotic at this time.  She denies any SI, HI, AVH      Final Clinical Impression(s) / ED Diagnoses Final diagnoses:  Homelessness    Rx / DC Orders ED Discharge Orders     None             Mykela Mewborn A, PA-C 04/22/23 1946    Coral Spikes, DO 04/22/23 2346

## 2023-04-23 ENCOUNTER — Other Ambulatory Visit: Payer: Self-pay

## 2023-04-23 ENCOUNTER — Emergency Department (HOSPITAL_COMMUNITY)
Admission: EM | Admit: 2023-04-23 | Discharge: 2023-04-23 | Disposition: A | Discharge status: Home / Self Care | Payer: 59 | Attending: Emergency Medicine | Admitting: Emergency Medicine

## 2023-04-23 ENCOUNTER — Encounter (HOSPITAL_COMMUNITY): Payer: Self-pay

## 2023-04-23 DIAGNOSIS — F22 Delusional disorders: Secondary | ICD-10-CM | POA: Insufficient documentation

## 2023-04-23 DIAGNOSIS — I10 Essential (primary) hypertension: Secondary | ICD-10-CM | POA: Insufficient documentation

## 2023-04-23 DIAGNOSIS — M79672 Pain in left foot: Secondary | ICD-10-CM | POA: Diagnosis not present

## 2023-04-23 DIAGNOSIS — M79671 Pain in right foot: Secondary | ICD-10-CM | POA: Diagnosis present

## 2023-04-23 DIAGNOSIS — M7989 Other specified soft tissue disorders: Secondary | ICD-10-CM | POA: Diagnosis not present

## 2023-04-23 DIAGNOSIS — Z79899 Other long term (current) drug therapy: Secondary | ICD-10-CM | POA: Diagnosis not present

## 2023-04-23 DIAGNOSIS — J45909 Unspecified asthma, uncomplicated: Secondary | ICD-10-CM | POA: Insufficient documentation

## 2023-04-23 MED ORDER — ACETAMINOPHEN 325 MG PO TABS
650.0000 mg | ORAL_TABLET | Freq: Once | ORAL | Status: AC
  Administered 2023-04-23: 650 mg via ORAL
  Filled 2023-04-23: qty 2

## 2023-04-23 MED ORDER — OLANZAPINE 10 MG PO TABS
10.0000 mg | ORAL_TABLET | Freq: Every day | ORAL | Status: DC
  Filled 2023-04-23: qty 1

## 2023-04-23 MED ORDER — CEPHALEXIN 250 MG PO CAPS
500.0000 mg | ORAL_CAPSULE | Freq: Once | ORAL | Status: AC
  Administered 2023-04-23: 500 mg via ORAL
  Filled 2023-04-23: qty 2

## 2023-04-23 MED ORDER — DIVALPROEX SODIUM 250 MG PO DR TAB
500.0000 mg | DELAYED_RELEASE_TABLET | Freq: Once | ORAL | Status: AC
  Administered 2023-04-23: 500 mg via ORAL
  Filled 2023-04-23: qty 2

## 2023-04-23 MED ORDER — FLUPHENAZINE HCL 5 MG PO TABS
10.0000 mg | ORAL_TABLET | Freq: Once | ORAL | Status: AC
  Administered 2023-04-23: 10 mg via ORAL
  Filled 2023-04-23: qty 2

## 2023-04-23 MED ORDER — CEPHALEXIN 500 MG PO CAPS: 500.0000 mg | ORAL_CAPSULE | Freq: Four times a day (QID) | ORAL | 0 refills | Status: AC

## 2023-04-23 NOTE — Discharge Instructions (Signed)
You have been seen today for your complaint of foot pain. Your discharge medications include Keflex. This is an antibiotic. You should take it as prescribed. You should take it for the entire duration of the prescription. This may cause an upset stomach. This is normal. You may take this with food. You may also eat yogurt to prevent diarrhea. Follow up with: Your primary care doctor for reevaluation Please seek immediate medical care if you develop any of the following symptoms: You have an injury or infection that is not healing normally. You develop new weakness in an arm or leg. You have fallen or do so frequently. At this time there does not appear to be the presence of an emergent medical condition, however there is always the potential for conditions to change. Please read and follow the below instructions.  Do not take your medicine if  develop an itchy rash, swelling in your mouth or lips, or difficulty breathing; call 911 and seek immediate emergency medical attention if this occurs.  You may review your lab tests and imaging results in their entirety on your MyChart account.  Please discuss all results of fully with your primary care provider and other specialist at your follow-up visit.  Note: Portions of this text may have been transcribed using voice recognition software. Every effort was made to ensure accuracy; however, inadvertent computerized transcription errors may still be present.

## 2023-04-23 NOTE — ED Triage Notes (Signed)
Pt brought in by EMS, is homeless and living outside as she was unable to get a room at a shelter. Patient has hx of schizophrenia and diabetes. Patient says she has foot pain. She also said she ran out of her schizophrenic medications. Denies SI/HI

## 2023-04-23 NOTE — ED Notes (Signed)
Tried to call the guardian and was unable to get a hold of her.

## 2023-04-23 NOTE — ED Provider Notes (Cosign Needed Addendum)
EMERGENCY DEPARTMENT AT [facility] Provider Note   CSN: [id number] Arrival date & time: 04/23/23  1825     History  Chief Complaint  Patient presents with   Generalized Body Aches    Molly [name] is a 37 y.o. female.  The history is provided by the patient and medical records.   36 y.o. F with hx of schizophrenia, eczema, hypertension, depression, paranoia, here reportedly "not feeling well".  When I asked her what specifically she is not feeling well about she states "I thought I was having babies, I may have left them in the waiting room wrapped in blankets."  Patient then falls back asleep and begins snoring.  Patient well-known to the ED.  Often presenting with fictitious pregnancy.  Pregnancy test over the past month have all been negative.  Home Medications Prior to Admission medications   Medication Sig Start Date End Date Taking? Authorizing Provider  acetaminophen (TYLENOL) 500 MG tablet Take 500-1,000 mg by mouth every 6 (six) hours as needed for mild pain (pain score 1-3) or headache.    [provider]  albuterol (VENTOLIN HFA) 108 (90 Base) MCG/ACT inhaler Inhale 1-2 puffs into the lungs every 4 (four) hours as needed for wheezing or shortness of breath.    [provider]  cephALEXin (KEFLEX) 500 MG capsule Take 1 capsule (500 mg total) by mouth 4 (four) times daily. 04/23/23   Schutt, Edsel Petrin, PA-C  divalproex (DEPAKOTE ER) 500 MG 24 hr tablet Take 1 tablet (500 mg total) by mouth daily. 04/02/23   Sabas Sous, MD  fluPHENAZine (PROLIXIN) 10 MG tablet Take 10 mg by mouth at bedtime. 05/25/22   [provider]  fluPHENAZine decanoate (PROLIXIN) 25 MG/ML injection Inject 25 mg into the muscle every 21 ( twenty-one) days.    [provider]  lithium carbonate (LITHOBID) 300 MG ER tablet Take 300 mg by mouth 2 (two) times daily. 12/17/22   [provider]  OLANZapine (ZYPREXA) 10 MG tablet Take 1  tablet (10 mg total) by mouth at bedtime. 04/02/23   Sabas Sous, MD      Allergies    Abilify [aripiprazole]    Review of Systems   Review of Systems  Psychiatric/Behavioral:         Delusions  All other systems reviewed and are negative.   Physical Exam Updated Vital Signs BP (!) 152/127 (BP Location: Right Wrist)   Pulse (!) 103   Temp (!) 97.5 F (36.4 C) (Oral)   Resp 16   LMP 03/26/2023 (Exact Date)   SpO2 100%   Physical Exam Vitals and nursing note reviewed.  Constitutional:      Appearance: She is well-developed.     Comments: Sleeping, awoken for exam  HENT:     Head: Normocephalic and atraumatic.  Eyes:     Conjunctiva/sclera: Conjunctivae normal.     Pupils: Pupils are equal, round, and reactive to light.  Cardiovascular:     Rate and Rhythm: Normal rate and regular rhythm.     Heart sounds: Normal heart sounds.  Pulmonary:     Effort: Pulmonary effort is normal. No respiratory distress.     Breath sounds: Normal breath sounds. No rhonchi.  Abdominal:     General: Bowel sounds are normal.     Palpations: Abdomen is soft.  Musculoskeletal:        General: Normal range of motion.     Cervical back: Normal range of motion.  Skin:    General: Skin is warm and dry.  Neurological:     Mental Status: She is alert and oriented to person, place, and time.  Psychiatric:     Comments: Baseline Denies SI/HI     ED Results / Procedures / Treatments   Labs (all labs ordered are listed, but only abnormal results are displayed) Labs Reviewed - No data to display  EKG None  Radiology No results found.  Procedures Procedures    Medications Ordered in ED Medications - No data to display  ED Course/ Medical Decision Making/ A&P                                 Medical Decision Making Risk Prescription drug management.   37 year old female here with delusions.  Reports she thinks she had some babies and left them in the waiting room.  She is  well-known to the ED, often here with fictitious pregnancy.  Recent pregnancy tests over the past month have all been negative.  She appears at her psychiatric baseline.  She denies any SI/HI.  Does not meet criteria for IVC at this time.  Stable for discharge with outpatient follow-up.  11:31 PM Patient now causing quite a disturbance.  She wanted her nighttime Zyprexa which I ordered for her, then refused to take it because our tablet is "white" and hers at home is "orange".  Patient now stating she has never seen a provider.  Patient is very well-known to myself.  This is her second ED visit today alone.  Will have security escort out.  Final Clinical Impression(s) / ED Diagnoses Final diagnoses:  Delusional thoughts [facility])    Rx / DC Orders ED Discharge Orders     None         Garlon Hatchet, PA-C 04/23/23 2257    Garlon Hatchet, PA-C 04/23/23 2332    Eber Hong, MD 04/24/23 1101

## 2023-04-23 NOTE — ED Notes (Signed)
Security needed to get patient out. She refused to leave, said she did not see a provider. She indeed did see one multiple times.

## 2023-04-23 NOTE — ED Provider Notes (Signed)
[place] EMERGENCY DEPARTMENT AT [facility] Provider Note   CSN: [id number] Arrival date & time: 04/23/23  0344     History  Chief Complaint  Patient presents with   Foot Pain    Molly [name] is a 37 y.o. female.  With history of schizophrenia, ADHD, depression, hypertension, asthma, neuropathy presenting to the ED for evaluation of bilateral foot pain.  She reports pain to her legs and her feet bilaterally.  Has been present for a few days now.  No history of trauma.  Pain in her feet is described as a sharp and tingling sensation.  She reports some swelling to bilateral calves and feet as well.  She denies any fevers.  No nausea or vomiting.  She is requesting doses of her fluphenazine and Depakote.    Foot Pain       Home Medications Prior to Admission medications   Medication Sig Start Date End Date Taking? Authorizing Provider  cephALEXin (KEFLEX) 500 MG capsule Take 1 capsule (500 mg total) by mouth 4 (four) times daily. 04/23/23  Yes Elward Nocera, Edsel Petrin, PA-C  acetaminophen (TYLENOL) 500 MG tablet Take 500-1,000 mg by mouth every 6 (six) hours as needed for mild pain (pain score 1-3) or headache.    [provider]  albuterol (VENTOLIN HFA) 108 (90 Base) MCG/ACT inhaler Inhale 1-2 puffs into the lungs every 4 (four) hours as needed for wheezing or shortness of breath.    [provider]  divalproex (DEPAKOTE ER) 500 MG 24 hr tablet Take 1 tablet (500 mg total) by mouth daily. 04/02/23   Sabas Sous, MD  fluPHENAZine (PROLIXIN) 10 MG tablet Take 10 mg by mouth at bedtime. 05/25/22   [provider]  fluPHENAZine decanoate (PROLIXIN) 25 MG/ML injection Inject 25 mg into the muscle every 21 ( twenty-one) days.    [provider]  lithium carbonate (LITHOBID) 300 MG ER tablet Take 300 mg by mouth 2 (two) times daily. 12/17/22   [provider]  OLANZapine (ZYPREXA) 10 MG tablet Take 1 tablet (10 mg total) by mouth at  bedtime. 04/02/23   Sabas Sous, MD      Allergies    Abilify [aripiprazole]    Review of Systems   Review of Systems  Musculoskeletal:  Positive for arthralgias and myalgias.  All other systems reviewed and are negative.   Physical Exam Updated Vital Signs BP 114/82   Pulse (!) 115   Temp 98.8 F (37.1 C)   Resp 16   LMP 03/26/2023 (Exact Date)   SpO2 100%  Physical Exam Vitals and nursing note reviewed.  Constitutional:      General: She is not in acute distress.    Appearance: Normal appearance. She is normal weight. She is not ill-appearing.     Comments: disheleved  HENT:     Head: Normocephalic and atraumatic.  Pulmonary:     Effort: Pulmonary effort is normal. No respiratory distress.  Abdominal:     General: Abdomen is flat.  Musculoskeletal:        General: Normal range of motion.     Cervical back: Neck supple.     Comments: Mild swelling to bilateral lower extremities, no pitting edema.  No overlying erythema.  Compartments are soft.  There is moderate tenderness to palpation of bilateral anterior calves.  DP pulses 2+ bilaterally.  Sensation intact distally.  Capillary refill normal.  No rashes or lesions.  Ambulatory without difficulty.  Skin:  General: Skin is warm and dry.  Neurological:     Mental Status: She is alert and oriented to person, place, and time.  Psychiatric:        Mood and Affect: Mood normal.        Behavior: Behavior normal.     ED Results / Procedures / Treatments   Labs (all labs ordered are listed, but only abnormal results are displayed) Labs Reviewed - No data to display  EKG None  Radiology No results found.  Procedures Procedures    Medications Ordered in ED Medications  divalproex (DEPAKOTE) DR tablet 500 mg (has no administration in time range)  fluPHENAZine (PROLIXIN) tablet 10 mg (has no administration in time range)  acetaminophen (TYLENOL) tablet 650 mg (has no administration in time range)   cephALEXin (KEFLEX) capsule 500 mg (has no administration in time range)    ED Course/ Medical Decision Making/ A&P                                 Medical Decision Making Risk OTC drugs. Prescription drug management.  This patient presents to the ED for concern of foot pain, this involves an extensive number of treatment options, and is a complaint that carries with it a high risk of complications and morbidity.  The differential diagnosis includes cellulitis, peripheral neuropathy, less likely bilateral DVT  Additional history obtained from: Nursing notes from this visit. Previous records within EMR system 30 ED visits in the past 6 months  Afebrile, tachycardic which is patient's baseline.  37 year old female presenting to the ED for evaluation of bilateral leg and foot pain.  Symptoms have been present for a few days now.  She is homeless and walks a lot per chart review.  Pain is described as a sharp and tingling sensation.  She was given dose of Tylenol in the emergency department.  Will also prescribe Keflex for potential bilateral cellulitis however less likely.  She was requesting doses of her fluphenazine and Depakote in the emergency department which was given.  She was encouraged to follow-up.  She was given return precautions.  Stable at discharge.  At this time there does not appear to be any evidence of an acute emergency medical condition and the patient appears stable for discharge with appropriate outpatient follow up. Diagnosis was discussed with patient who verbalizes understanding of care plan and is agreeable to discharge. I have discussed return precautions with patient who verbalizes understanding. Patient encouraged to follow-up with their PCP within 1 week. All questions answered.  Note: Portions of this report may have been transcribed using voice recognition software. Every effort was made to ensure accuracy; however, inadvertent computerized transcription errors  may still be present.         Final Clinical Impression(s) / ED Diagnoses Final diagnoses:  Bilateral foot pain    Rx / DC Orders ED Discharge Orders          Ordered    cephALEXin (KEFLEX) 500 MG capsule  4 times daily        04/23/23 1214              Michelle Piper, Cordelia Poche 04/23/23 1248    Terald Sleeper, MD 04/23/23 1258

## 2023-04-23 NOTE — ED Notes (Signed)
Pt screaming at this tech while taking vitals

## 2023-04-23 NOTE — ED Notes (Signed)
Patient is refusing to leave. She wont take her Zyprexa because its the "white pill" and not the "orange pill". Provider made aware, will have security take her out.

## 2023-04-23 NOTE — ED Provider Triage Note (Signed)
Emergency Medicine Provider Triage Evaluation Note  Molly [name] , a 37 y.o. female  was evaluated in triage.  Pt complains of not feeling well.  She states that he feet hurt.  She states that she sometimes has a fever.  Sometimes has SOB.  Denies any new symptoms.  Was seen earlier at [facility].  Review of Systems  Positive: Foot pain Negative: Abdominal pain  Physical Exam  LMP 03/26/2023 (Exact Date)   SpO2 99%  Gen:   Awake, no distress   Resp:  Normal effort  MSK:   Moves extremities without difficulty  Other:  Standing at the bridge  Medical Decision Making  Medically screening exam initiated at 3:41 AM.  Appropriate orders placed.  Robinette Karnes was informed that the remainder of the evaluation will be completed by another provider, this initial triage assessment does not replace that evaluation, and the importance of remaining in the ED until their evaluation is complete.     Roxy Horseman, PA-C 04/23/23 [phone]

## 2023-04-23 NOTE — ED Triage Notes (Signed)
Patient states she is having pain all over. Does not give specifics of what is hurting or what is causing pain.

## 2023-04-24 ENCOUNTER — Encounter (HOSPITAL_COMMUNITY): Payer: Self-pay

## 2023-04-24 ENCOUNTER — Other Ambulatory Visit: Payer: Self-pay

## 2023-04-24 ENCOUNTER — Emergency Department (HOSPITAL_COMMUNITY)
Admission: EM | Admit: 2023-04-24 | Discharge: 2023-04-24 | Disposition: A | Discharge status: Home / Self Care | Payer: 59 | Attending: Emergency Medicine | Admitting: Emergency Medicine

## 2023-04-24 DIAGNOSIS — M79671 Pain in right foot: Secondary | ICD-10-CM | POA: Diagnosis present

## 2023-04-24 DIAGNOSIS — M79672 Pain in left foot: Secondary | ICD-10-CM | POA: Insufficient documentation

## 2023-04-24 MED ORDER — ACETAMINOPHEN 325 MG PO TABS
650.0000 mg | ORAL_TABLET | Freq: Once | ORAL | Status: AC
Start: 2023-04-24 — End: 2023-04-24
  Administered 2023-04-24: 650 mg via ORAL
  Filled 2023-04-24: qty 2

## 2023-04-24 NOTE — ED Provider Notes (Signed)
[place] EMERGENCY DEPARTMENT AT [facility] Provider Note   CSN: [id number] Arrival date & time: 04/24/23  1402     History  Chief Complaint  Patient presents with   Extremity Pain    Molly [name] is a 37 y.o. female.  Patient c/o pain to bilateral feet. Indicates similar pain on chronic, recurrent basis. Denies recent trauma, fall, injury or strain. Indicates had been up on feet quite a lot. Denies redness or increased swelling. No fever or chills. No radicular pain. No new numbness/weakness. No skin lesions, rash or abrasions.   The history is provided by the patient, medical records and the EMS personnel. The history is limited by the condition of the patient.  Extremity Pain Pertinent negatives include no chest pain, no abdominal pain and no headaches.       Home Medications Prior to Admission medications   Medication Sig Start Date End Date Taking? Authorizing Provider  acetaminophen (TYLENOL) 500 MG tablet Take 500-1,000 mg by mouth every 6 (six) hours as needed for mild pain (pain score 1-3) or headache.    [provider]  albuterol (VENTOLIN HFA) 108 (90 Base) MCG/ACT inhaler Inhale 1-2 puffs into the lungs every 4 (four) hours as needed for wheezing or shortness of breath.    [provider]  cephALEXin (KEFLEX) 500 MG capsule Take 1 capsule (500 mg total) by mouth 4 (four) times daily. 04/23/23   Schutt, Edsel Petrin, PA-C  divalproex (DEPAKOTE ER) 500 MG 24 hr tablet Take 1 tablet (500 mg total) by mouth daily. 04/02/23   Sabas Sous, MD  fluPHENAZine (PROLIXIN) 10 MG tablet Take 10 mg by mouth at bedtime. 05/25/22   [provider]  fluPHENAZine decanoate (PROLIXIN) 25 MG/ML injection Inject 25 mg into the muscle every 21 ( twenty-one) days.    [provider]  lithium carbonate (LITHOBID) 300 MG ER tablet Take 300 mg by mouth 2 (two) times daily. 12/17/22   [provider]  OLANZapine (ZYPREXA) 10 MG tablet  Take 1 tablet (10 mg total) by mouth at bedtime. 04/02/23   Sabas Sous, MD      Allergies    Abilify [aripiprazole]    Review of Systems   Review of Systems  Constitutional:  Negative for fever.  Respiratory:  Negative for cough.   Cardiovascular:  Negative for chest pain.  Gastrointestinal:  Negative for abdominal pain and vomiting.  Musculoskeletal:  Negative for back pain.  Skin:  Negative for rash and wound.  Neurological:  Negative for weakness, numbness and headaches.    Physical Exam Updated Vital Signs BP 137/86 (BP Location: Left Arm)   Pulse (!) 102   Temp 98.2 F (36.8 C) (Oral)   Resp 18   Ht 1.727 m (5\' 8" )   Wt 119.7 kg   LMP 03/26/2023 (Exact Date)   SpO2 100%   BMI 40.14 kg/m  Physical Exam Vitals and nursing note reviewed.  Constitutional:      Appearance: Normal appearance. She is well-developed.  HENT:     Head: Atraumatic.     Nose: Nose normal.     Mouth/Throat:     Mouth: Mucous membranes are moist.  Eyes:     General: No scleral icterus.    Conjunctiva/sclera: Conjunctivae normal.     Pupils: Pupils are equal, round, and reactive to light.  Neck:     Trachea: No tracheal deviation.  Cardiovascular:     Rate and Rhythm: Normal rate and regular  rhythm.     Pulses: Normal pulses.     Heart sounds: Normal heart sounds. No murmur heard.    No friction rub. No gallop.  Pulmonary:     Effort: Pulmonary effort is normal. No respiratory distress.     Breath sounds: Normal breath sounds.  Abdominal:     General: There is no distension.     Palpations: Abdomen is soft.     Tenderness: There is no abdominal tenderness.  Genitourinary:    Comments: No cva tenderness.  Musculoskeletal:     Cervical back: Normal range of motion and neck supple. No rigidity. No muscular tenderness.     Comments: CTLS spine, non tender, aligned, no step off. Mild symmetric bilateral ankle/foot swelling (pt indicates baseline). Good rom bil knees and ankles  without pain. Bil legs and feet are of normal color and warmth. Distal pulses palp bil. No focal swelling or bony tenderness. No erythema or cellulitis. Skin intact.   Skin:    General: Skin is warm and dry.     Findings: No rash.  Neurological:     Mental Status: She is alert.     Comments: Alert, speech normal. Bil legs/feet nvi, with intact motor/sens fxn.   Psychiatric:        Mood and Affect: Mood normal.     ED Results / Procedures / Treatments   Labs (all labs ordered are listed, but only abnormal results are displayed) Labs Reviewed - No data to display  EKG None  Radiology No results found.  Procedures Procedures    Medications Ordered in ED Medications - No data to display  ED Course/ Medical Decision Making/ A&P                                 Medical Decision Making Problems Addressed: Bilateral foot pain: acute illness or injury    Details: Acute on chronic  Amount and/or Complexity of Data Reviewed External Data Reviewed: labs, radiology and notes.  Risk OTC drugs.   No meds pta.   Reviewed nursing notes and prior charts for additional history.   Pt has had prior imaging of bil feet - neg for acute process then. Pt denies any recent trauma or injury. No sign of infection on exam.   Acetaminophen po.   Po fluids/food. Currently hr 88, rr 16. No distress.   Pt appears stable for d/c.   Rec pcp f/u.   Return precautions provided.           Final Clinical Impression(s) / ED Diagnoses Final diagnoses:  None    Rx / DC Orders ED Discharge Orders     None         Cathren Laine, MD 04/24/23 [phone]

## 2023-04-24 NOTE — ED Notes (Signed)
Pt given sandwich and drink.

## 2023-04-24 NOTE — Discharge Instructions (Signed)
It was our pleasure to provide your ER care today - we hope that you feel better.  Continue to keep feet warm and dry.  Elevated feet when not up and about. Take acetaminophen as need.   Follow up with primary care doctor in the next 1-2 weeks.  Return to ER if worse, new symptoms, fevers, new/severe pain, increased swelling/redness,  chest pain, trouble breathing, or other concern.

## 2023-04-24 NOTE — ED Triage Notes (Signed)
Pt found laying on sidewalk in front of [facility]. Pt seen here yesterday for generalized pain and presents for same complaint. Pt reports "burning pain in legs and back". No visible injury, no trauma reported.  VSS.

## 2023-04-29 ENCOUNTER — Emergency Department (HOSPITAL_COMMUNITY)
Admission: EM | Admit: 2023-04-29 | Discharge: 2023-05-02 | Disposition: A | Discharge status: Home / Self Care | Payer: 59

## 2023-04-29 ENCOUNTER — Ambulatory Visit (INDEPENDENT_AMBULATORY_CARE_PROVIDER_SITE_OTHER)
Admission: EM | Admit: 2023-04-29 | Discharge: 2023-04-29 | Disposition: A | Discharge status: Other Acute Inpatient Hospital | Payer: 59 | Source: Home / Self Care

## 2023-04-29 ENCOUNTER — Other Ambulatory Visit: Payer: Self-pay

## 2023-04-29 DIAGNOSIS — F250 Schizoaffective disorder, bipolar type: Secondary | ICD-10-CM | POA: Insufficient documentation

## 2023-04-29 DIAGNOSIS — I10 Essential (primary) hypertension: Secondary | ICD-10-CM | POA: Insufficient documentation

## 2023-04-29 DIAGNOSIS — R451 Restlessness and agitation: Secondary | ICD-10-CM | POA: Diagnosis present

## 2023-04-29 DIAGNOSIS — E119 Type 2 diabetes mellitus without complications: Secondary | ICD-10-CM | POA: Insufficient documentation

## 2023-04-29 DIAGNOSIS — Z5900 Homelessness unspecified: Secondary | ICD-10-CM | POA: Insufficient documentation

## 2023-04-29 DIAGNOSIS — Z1152 Encounter for screening for COVID-19: Secondary | ICD-10-CM | POA: Diagnosis not present

## 2023-04-29 DIAGNOSIS — J45909 Unspecified asthma, uncomplicated: Secondary | ICD-10-CM | POA: Insufficient documentation

## 2023-04-29 DIAGNOSIS — F201 Disorganized schizophrenia: Secondary | ICD-10-CM | POA: Diagnosis not present

## 2023-04-29 DIAGNOSIS — Z79899 Other long term (current) drug therapy: Secondary | ICD-10-CM | POA: Insufficient documentation

## 2023-04-29 DIAGNOSIS — Z87891 Personal history of nicotine dependence: Secondary | ICD-10-CM | POA: Diagnosis not present

## 2023-04-29 DIAGNOSIS — Z91148 Patient's other noncompliance with medication regimen for other reason: Secondary | ICD-10-CM | POA: Insufficient documentation

## 2023-04-29 LAB — CBC
HCT: 35 % — ABNORMAL LOW (ref 36.0–46.0)
Hemoglobin: 11.1 g/dL — ABNORMAL LOW (ref 12.0–15.0)
MCH: 27.8 pg (ref 26.0–34.0)
MCHC: 31.7 g/dL (ref 30.0–36.0)
MCV: 87.5 fL (ref 80.0–100.0)
Platelets: 515 10*3/uL — ABNORMAL HIGH (ref 150–400)
RBC: 4 MIL/uL (ref 3.87–5.11)
RDW: 16.3 % — ABNORMAL HIGH (ref 11.5–15.5)
WBC: 13.5 10*3/uL — ABNORMAL HIGH (ref 4.0–10.5)
nRBC: 0 % (ref 0.0–0.2)

## 2023-04-29 LAB — COMPREHENSIVE METABOLIC PANEL
ALT: 16 U/L (ref 0–44)
AST: 16 U/L (ref 15–41)
Albumin: 3.6 g/dL (ref 3.5–5.0)
Alkaline Phosphatase: 95 U/L (ref 38–126)
Anion gap: 8 (ref 5–15)
BUN: 9 mg/dL (ref 6–20)
CO2: 26 mmol/L (ref 22–32)
Calcium: 8.8 mg/dL — ABNORMAL LOW (ref 8.9–10.3)
Chloride: 101 mmol/L (ref 98–111)
Creatinine, Ser: 0.74 mg/dL (ref 0.44–1.00)
GFR, Estimated: >60 mL/min (ref >60)
Glucose, Bld: 97 mg/dL (ref 70–99)
Potassium: 3.5 mmol/L (ref 3.5–5.1)
Sodium: 135 mmol/L (ref 135–145)
Total Bilirubin: 0.3 mg/dL (ref <1.2)
Total Protein: 7.7 g/dL (ref 6.5–8.1)

## 2023-04-29 LAB — ACETAMINOPHEN LEVEL: Acetaminophen (Tylenol), Serum: <10 ug/mL — ABNORMAL LOW (ref 10–30)

## 2023-04-29 LAB — ETHANOL: Alcohol, Ethyl (B): <10 mg/dL (ref <10)

## 2023-04-29 LAB — HCG, SERUM, QUALITATIVE: Preg, Serum: NEGATIVE

## 2023-04-29 LAB — SALICYLATE LEVEL: Salicylate Lvl: <7 mg/dL — ABNORMAL LOW (ref 7.0–30.0)

## 2023-04-29 MED ORDER — DIPHENHYDRAMINE HCL 50 MG PO CAPS: 50.0000 mg | ORAL_CAPSULE | Freq: Four times a day (QID) | ORAL | Status: DC | PRN

## 2023-04-29 MED ORDER — DIPHENHYDRAMINE HCL 50 MG/ML IJ SOLN: 50.0000 mg | Freq: Four times a day (QID) | INTRAMUSCULAR | Status: DC | PRN

## 2023-04-29 MED ORDER — LORAZEPAM 2 MG/ML IJ SOLN
1.0000 mg | Freq: Four times a day (QID) | INTRAMUSCULAR | Status: DC | PRN
  Filled 2023-04-29: qty 1

## 2023-04-29 MED ORDER — ONDANSETRON HCL 4 MG PO TABS: 4.0000 mg | ORAL_TABLET | Freq: Three times a day (TID) | ORAL | Status: DC | PRN

## 2023-04-29 MED ORDER — LORAZEPAM 1 MG PO TABS
1.0000 mg | ORAL_TABLET | ORAL | Status: AC | PRN
  Administered 2023-05-01: 1 mg via ORAL

## 2023-04-29 MED ORDER — ZIPRASIDONE MESYLATE 20 MG IM SOLR
20.0000 mg | INTRAMUSCULAR | Status: DC | PRN
  Filled 2023-04-29 (×2): qty 20

## 2023-04-29 MED ORDER — STERILE WATER FOR INJECTION IJ SOLN
INTRAMUSCULAR | Status: AC
  Filled 2023-04-29: qty 10

## 2023-04-29 MED ORDER — LORAZEPAM 1 MG PO TABS
1.0000 mg | ORAL_TABLET | Freq: Four times a day (QID) | ORAL | Status: DC | PRN
  Administered 2023-04-29: 1 mg via ORAL
  Filled 2023-04-29 (×3): qty 1

## 2023-04-29 MED ORDER — HALOPERIDOL 5 MG PO TABS: 5.0000 mg | ORAL_TABLET | Freq: Four times a day (QID) | ORAL | Status: DC | PRN

## 2023-04-29 MED ORDER — OLANZAPINE 10 MG PO TBDP
10.0000 mg | ORAL_TABLET | Freq: Three times a day (TID) | ORAL | Status: DC | PRN
  Administered 2023-04-30 – 2023-05-02 (×3): 10 mg via ORAL
  Filled 2023-04-29 (×3): qty 1

## 2023-04-29 MED ORDER — ZIPRASIDONE MESYLATE 20 MG IM SOLR
20.0000 mg | INTRAMUSCULAR | Status: AC | PRN
  Administered 2023-04-30: 20 mg via INTRAMUSCULAR

## 2023-04-29 MED ORDER — LORAZEPAM 1 MG PO TABS
1.0000 mg | ORAL_TABLET | Freq: Four times a day (QID) | ORAL | Status: DC | PRN
  Administered 2023-04-29: 1 mg via ORAL
  Filled 2023-04-29: qty 1

## 2023-04-29 MED ORDER — HALOPERIDOL LACTATE 5 MG/ML IJ SOLN: 5.0000 mg | Freq: Four times a day (QID) | INTRAMUSCULAR | Status: DC | PRN

## 2023-04-29 MED ORDER — OLANZAPINE 5 MG PO TBDP
5.0000 mg | ORAL_TABLET | Freq: Four times a day (QID) | ORAL | Status: DC | PRN
  Administered 2023-04-29: 5 mg via ORAL
  Filled 2023-04-29: qty 1

## 2023-04-29 MED ORDER — OLANZAPINE 10 MG PO TBDP: 10.0000 mg | ORAL_TABLET | Freq: Four times a day (QID) | ORAL | Status: DC | PRN

## 2023-04-29 MED ORDER — DIPHENHYDRAMINE HCL 25 MG PO CAPS
50.0000 mg | ORAL_CAPSULE | Freq: Four times a day (QID) | ORAL | Status: DC | PRN
  Administered 2023-04-30 – 2023-05-01 (×2): 50 mg via ORAL
  Filled 2023-04-29 (×2): qty 2

## 2023-04-29 NOTE — ED Notes (Signed)
Findings and custody received. [employer] called to transport pt to [facility]. Advised that only [employer] is needed, not EMS.

## 2023-04-29 NOTE — Progress Notes (Signed)
   04/29/23 0917  [facility] Triage Screening (Walk-ins at [facility] only)  How Did You Hear About [place]? Legal System  What Is the Reason for Your Visit/Call Today? Molly [name] is a 37 year old female presenting to [facility] escorted by [employer] voluntarily. Pt reports that she is in need of medication. Pt is paranoid and psychotic and unable to fully elaborate on her presenting concern. Pts LG can be reached at [phone]. Pt denies substance use, Si, Hi and Avh.  How Long Has This Been Causing You Problems? <Week  Have You Recently Had Any Thoughts About Hurting Yourself? No  Are You Planning to Commit Suicide/Harm Yourself At This time? No  Have you Recently Had Thoughts About Hurting Someone [name]? No  Are You Planning To Harm Someone At This Time? No  Physical Abuse Denies  Verbal Abuse Denies  Sexual Abuse Denies  Exploitation of patient/patient's resources Denies  Self-Neglect Denies  Possible abuse reported to: Other (Comment)  Are you currently experiencing any auditory, visual or other hallucinations? No  Have You Used Any Alcohol or Drugs in the Past 24 Hours? No  Do you have any current medical co-morbidities that require immediate attention? No  Clinician description of patient physical appearance/behavior: paranoid, psychotic, pressured speech  What Do You Feel Would Help You the Most Today? Medication(s)  If access to [facility] was not available, would you have sought care in the Emergency Department? No  Determination of Need Routine (7 days)  Options For Referral Medication Management

## 2023-04-29 NOTE — ED Notes (Signed)
Report given to [facility] ED Charge Nurse. Awaiting Findings and Custody to call [employer] so pt can be served and transported.

## 2023-04-29 NOTE — ED Notes (Signed)
Called [employer] for an ETA. Was advised no ETA is available at this time. They are awaiting officer availability.

## 2023-04-29 NOTE — Progress Notes (Signed)
   04/29/23 0917  [facility] Triage Screening (Walk-ins at [facility] only)  How Did You Hear About [place]? Legal System  What Is the Reason for Your Visit/Call Today? Molly [name] is a 37 year old female presenting to [facility] escorted by [employer] voluntarily. Pt reports that she is in need of medication. Pt is paranoid and psychotic and unable to fully elaborate on her presenting concern. Pts ACT team can be reached at [phone]. Pt denies substance use, Si, Hi and Avh.  How Long Has This Been Causing You Problems? <Week  Have You Recently Had Any Thoughts About Hurting Yourself? No  Are You Planning to Commit Suicide/Harm Yourself At This time? No  Have you Recently Had Thoughts About Hurting Someone [name]? No  Are You Planning To Harm Someone At This Time? No  Physical Abuse Denies  Verbal Abuse Denies  Sexual Abuse Denies  Exploitation of patient/patient's resources Denies  Self-Neglect Denies  Possible abuse reported to: Other (Comment)  Are you currently experiencing any auditory, visual or other hallucinations? No  Have You Used Any Alcohol or Drugs in the Past 24 Hours? No  Do you have any current medical co-morbidities that require immediate attention? No  Clinician description of patient physical appearance/behavior: paranoid, psychotic, pressured speech  What Do You Feel Would Help You the Most Today? Medication(s)  If access to [facility] was not available, would you have sought care in the Emergency Department? No  Determination of Need Routine (7 days)  Options For Referral Medication Management
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
## 2023-04-29 NOTE — ED Provider Notes (Signed)
[place] EMERGENCY DEPARTMENT AT [facility] Provider Note   CSN: [id number] Arrival date & time: 04/29/23  1514     History  Chief Complaint  Patient presents with   IVC    Molly [name] is a 37 y.o. female.  37 year old female with past medical history of schizophrenia and homelessness presenting to the emergency department today after being sent in by behavioral health for medical clearance.  The patient is on an IVC from them.  The patient apparently was brought in today and was agitated.  She has disorganized thinking.  She was claiming there that she was "seeing her children".  The patient is very disorganized on my exam.  She is denying any suicidal or homicidal ideations.  She is denying any active hallucinations at this time but is very disorganized and tangential.  She denies any recent trauma or injuries.        Home Medications Prior to Admission medications   Medication Sig Start Date End Date Taking? Authorizing Provider  acetaminophen (TYLENOL) 500 MG tablet Take 500-1,000 mg by mouth every 6 (six) hours as needed for mild pain (pain score 1-3) or headache.    [provider]  albuterol (VENTOLIN HFA) 108 (90 Base) MCG/ACT inhaler Inhale 1-2 puffs into the lungs every 4 (four) hours as needed for wheezing or shortness of breath.    [provider]  cephALEXin (KEFLEX) 500 MG capsule Take 1 capsule (500 mg total) by mouth 4 (four) times daily. 04/23/23   Schutt, Edsel Petrin, PA-C  divalproex (DEPAKOTE ER) 500 MG 24 hr tablet Take 1 tablet (500 mg total) by mouth daily. 04/02/23   Sabas Sous, MD  fluPHENAZine (PROLIXIN) 10 MG tablet Take 10 mg by mouth at bedtime. 05/25/22   [provider]  fluPHENAZine decanoate (PROLIXIN) 25 MG/ML injection Inject 25 mg into the muscle every 21 ( twenty-one) days.    [provider]  lithium carbonate (LITHOBID) 300 MG ER tablet Take 300 mg by mouth 2 (two) times daily. 12/17/22    [provider]  OLANZapine (ZYPREXA) 10 MG tablet Take 1 tablet (10 mg total) by mouth at bedtime. 04/02/23   Sabas Sous, MD      Allergies    Abilify [aripiprazole]    Review of Systems   Review of Systems  Psychiatric/Behavioral:  Positive for agitation.   All other systems reviewed and are negative.   Physical Exam Updated Vital Signs BP (!) 142/105   Pulse (!) 109   Temp 98.5 F (36.9 C) (Oral)   Resp 18   SpO2 100%  Physical Exam Vitals and nursing note reviewed.   Gen: Agitated, tangential Eyes: PERRL, EOMI HEENT: no oropharyngeal swelling, the patient has what appears to be dried mud on her face that appears to be wiped on her face Neck: trachea midline Resp: clear to auscultation bilaterally Card: RRR, no murmurs, rubs, or gallops Abd: nontender, nondistended Extremities: no calf tenderness, no edema Vascular: 2+ radial pulses bilaterally, 2+ DP pulses bilaterally Neuro: No focal deficits Skin: no rashes Psyc: Agitated, tangential   ED Results / Procedures / Treatments   Labs (all labs ordered are listed, but only abnormal results are displayed) Labs Reviewed  COMPREHENSIVE METABOLIC PANEL - Abnormal; Notable for the following components:      Result Value   Calcium 8.8 (*)    All other components within normal limits  SALICYLATE LEVEL - Abnormal; Notable for the following components:   Salicylate Lvl <  7.0 (*)    All other components within normal limits  ACETAMINOPHEN LEVEL - Abnormal; Notable for the following components:   Acetaminophen (Tylenol), Serum <10 (*)    All other components within normal limits  CBC - Abnormal; Notable for the following components:   WBC 13.5 (*)    Hemoglobin 11.1 (*)    HCT 35.0 (*)    RDW 16.3 (*)    Platelets 515 (*)    All other components within normal limits  ETHANOL  HCG, SERUM, QUALITATIVE  RAPID URINE DRUG SCREEN, HOSP PERFORMED  HCG, SERUM, QUALITATIVE    EKG None  Radiology No  results found.  Procedures Procedures    Medications Ordered in ED Medications  diphenhydrAMINE (BENADRYL) capsule 50 mg (has no administration in time range)    Or  diphenhydrAMINE (BENADRYL) injection 50 mg (has no administration in time range)  OLANZapine zydis (ZYPREXA) disintegrating tablet 5 mg (5 mg Oral Given 04/29/23 1550)  LORazepam (ATIVAN) tablet 1 mg (1 mg Oral Given 04/29/23 1550)  ziprasidone (GEODON) injection 20 mg (has no administration in time range)  ondansetron (ZOFRAN) tablet 4 mg (has no administration in time range)  OLANZapine zydis (ZYPREXA) disintegrating tablet 10 mg (has no administration in time range)    And  LORazepam (ATIVAN) tablet 1 mg (has no administration in time range)    And  ziprasidone (GEODON) injection 20 mg (has no administration in time range)    ED Course/ Medical Decision Making/ A&P                                 Medical Decision Making 37 year old female with past medical history of schizophrenia and homelessness presenting to the emergency department today on an IVC from behavioral health.  The patient will require medical evaluation and psychiatric evaluation.  She does not appear to have any significant traumatic injuries and seems to be relatively cooperative at this time.  Will try to give the patient olanzapine orally.  If is unsuccessful give the patient Geodon to facilitate her medical workup.  She apparently was being very agitated earlier and was brought in by police but does not appear to have any acute injuries.  The patient's medical workup is unremarkable.  She is medically cleared for psychiatric evaluation.  Amount and/or Complexity of Data Reviewed Labs: ordered.  Risk Prescription drug management.           Final Clinical Impression(s) / ED Diagnoses Final diagnoses:  Agitation    Rx / DC Orders ED Discharge Orders     None         Durwin Glaze, MD 04/29/23 2041

## 2023-04-29 NOTE — ED Triage Notes (Addendum)
Pt BIB [employer] from [facility], IVC'd to be medically cleared.  Pt agitated. Strong odor of urine. White substance covering face

## 2023-04-29 NOTE — Progress Notes (Signed)
   04/29/23 0917  [facility] Triage Screening (Walk-ins at [facility] only)  How Did You Hear About [place]? Legal System  What Is the Reason for Your Visit/Call Today? Molly [name] is a 37 year old female presenting to [facility] escorted by [employer] voluntarily. Pt reports that she is in need of medication. Pt is paranoid and psychotic and unable to fully elaborate on her presenting concern. Pt denies substance use, Si, Hi and Avh.  How Long Has This Been Causing You Problems? <Week  Have You Recently Had Any Thoughts About Hurting Yourself? No  Are You Planning to Commit Suicide/Harm Yourself At This time? No  Have you Recently Had Thoughts About Hurting Someone [name]? No  Are You Planning To Harm Someone At This Time? No  Physical Abuse Denies  Verbal Abuse Denies  Sexual Abuse Denies  Exploitation of patient/patient's resources Denies  Self-Neglect Denies  Possible abuse reported to: Other (Comment)  Are you currently experiencing any auditory, visual or other hallucinations? No  Have You Used Any Alcohol or Drugs in the Past 24 Hours? No  Do you have any current medical co-morbidities that require immediate attention? No  Clinician description of patient physical appearance/behavior: paranoid, psychotic, pressured speech  What Do You Feel Would Help You the Most Today? Medication(s)  If access to [facility] was not available, would you have sought care in the Emergency Department? No  Determination of Need Routine (7 days)  Options For Referral Medication Management

## 2023-04-29 NOTE — ED Provider Notes (Signed)
Attempted to see patient she was in shower, will have next provider assess her.

## 2023-04-29 NOTE — Progress Notes (Signed)
Patient has been denied by [facility] and has been faxed out. Patient meets BH inpatient criteria per [facility], PHMNP. Patient has been faxed out to the following facilities:   [facility] [address], [place] [address] [phone]  [facility] [address] [place] [place] [address] [phone]  [facility] [address] [place] [address] [phone]  [facility] [address] [place] [address] [phone]  [facility] Patient Placement [facility], [place] [phone]  [facility] [address], [place] [address] [phone]  [facility] [address] [place] [address] [phone]  [facility] [address], [place] [address] [phone]  [facility] [address] [place] [address] [phone]  [facility] EFAX [address] [place], [place] [phone]  [facility] [address], [place] [address] [phone]  [facility] [address] [place] [address] [phone]  [facility] [address] [place] [address] [phone]  [facility] [address] [place] [address] [phone]   Damita Dunnings, MSW, [name]-A  10:05 PM 04/29/2023

## 2023-04-29 NOTE — Discharge Instructions (Addendum)
or your safety and to ensure you receive the most appropriate care, you are being transferred to the Emergency Department. This transfer is necessary for further evaluation and treatment that is not available at our urgent care facility.  The Emergency Department staff will provide:  Comprehensive medical evaluation.  Any additional treatments or tests needed to address your current condition.  Transportation will be arranged, and the Emergency Department team will be informed of your situation before your arrival.

## 2023-04-30 ENCOUNTER — Encounter (HOSPITAL_COMMUNITY): Payer: Self-pay | Admitting: Emergency Medicine

## 2023-04-30 DIAGNOSIS — R451 Restlessness and agitation: Secondary | ICD-10-CM | POA: Diagnosis not present

## 2023-04-30 DIAGNOSIS — F201 Disorganized schizophrenia: Secondary | ICD-10-CM | POA: Diagnosis not present

## 2023-04-30 LAB — RESP PANEL BY RT-PCR (RSV, FLU A&B, COVID)  RVPGX2
Influenza A by PCR: NEGATIVE
Influenza B by PCR: NEGATIVE
Resp Syncytial Virus by PCR: NEGATIVE
SARS Coronavirus 2 by RT PCR: NEGATIVE

## 2023-04-30 MED ORDER — DIVALPROEX SODIUM ER 500 MG PO TB24
750.0000 mg | ORAL_TABLET | Freq: Every day | ORAL | Status: DC
  Administered 2023-04-30 – 2023-05-01 (×2): 750 mg via ORAL
  Filled 2023-04-30 (×2): qty 1

## 2023-04-30 MED ORDER — FLUPHENAZINE HCL 5 MG PO TABS
10.0000 mg | ORAL_TABLET | Freq: Every day | ORAL | Status: DC
  Administered 2023-04-30 – 2023-05-01 (×2): 10 mg via ORAL
  Filled 2023-04-30 (×2): qty 2

## 2023-04-30 MED ORDER — LORAZEPAM 1 MG PO TABS
1.0000 mg | ORAL_TABLET | Freq: Once | ORAL | Status: AC
  Administered 2023-04-30: 1 mg via ORAL

## 2023-04-30 MED ORDER — STERILE WATER FOR INJECTION IJ SOLN
INTRAMUSCULAR | Status: AC
  Filled 2023-04-30: qty 10

## 2023-04-30 MED ORDER — OLANZAPINE 10 MG PO TBDP: 10.0000 mg | ORAL_TABLET | Freq: Every day | ORAL | Status: DC

## 2023-04-30 NOTE — ED Notes (Signed)
Dinner Tray given

## 2023-04-30 NOTE — ED Notes (Signed)
Sheriff called for transport to [place] °

## 2023-04-30 NOTE — ED Notes (Signed)
Attempting to move patient to [facility] and she started to throw a tantrum in room, hitting staff and refusing to take off pink robe. Security at bedside.

## 2023-04-30 NOTE — ED Notes (Signed)
Patient up out in hallway yelling and screaming demanding food

## 2023-04-30 NOTE — ED Notes (Signed)
Sheriff called again for patient transport

## 2023-04-30 NOTE — ED Provider Notes (Signed)
Emergency Medicine Observation Re-evaluation Note  Molly [name] is a 37 y.o. female, seen on rounds today.  Pt initially presented to the ED for complaints of IVC Currently, the patient is plan for transfer to [facility].  Physical Exam  BP (!) 142/105   Pulse (!) 118   Temp 98.5 F (36.9 C) (Oral)   Resp 18   SpO2 100%  Physical Exam General: Calm Cardiac: Well perfused Lungs: Even respirations Psych: Calm  ED Course / MDM  EKG:   I have reviewed the labs performed to date as well as medications administered while in observation.  Recent changes in the last 24 hours include patient accepted to [facility]. Dr. Sofie Hartigan is the accepting.  Plan  Current plan is for transfer to [facility].    Maia Plan, MD 04/30/23 [phone]

## 2023-04-30 NOTE — Consult Note (Signed)
[facility] Psychiatric Consult Initial  Patient Name: .Molly [name]  MRN: [id number]  DOB: 1985-05-10  Consult Order details:  Orders (From admission, onward)     Start     Ordered   04/29/23 1909  CONSULT TO CALL ACT TEAM       Ordering Provider: Durwin Glaze, MD  Provider:  (Not yet assigned)  Question:  Reason for Consult?  Answer:  Psych consult   04/29/23 1911             Mode of Visit: In person    Psychiatry Consult Evaluation  Service Date: April 30, 2023 LOS:  LOS: 0 days  Chief Complaint Agitation, disorganized  Primary Psychiatric Diagnoses  Schizophrenia, Disorganized type  Assessment  Molly [name] is a 37 y.o. female admitted: Presented to the [facility] 04/29/2023  3:23 PM for agitation. She carries the psychiatric diagnoses of Schizophrenia, Borderline Intellectual Functioning, Cannabis use disorder, homelessness and has a past medical history of  DM, Eczema Atopic Dermatitis, Obesity, .   Her current presentation of agitation is most consistent with her diagnosis of Schizophrenia. She meets criteria for inpatient Psychiatry hospitalization based on cognitive impairment, disorganized states of mind, .  Current outpatient psychotropic medications include Depakote, Prolixin  and Olanzapine and historically she has had a positive  response to these medications. She was not compliant with medications prior to admission as evidenced by her agitation, cognitive impairment, disorganized thought.. On initial examination, patient is Psychotic, confused and disorganized.. Please see plan below for detailed recommendations.   Diagnoses:  Active Hospital problems: Principal Problem:   Disorganized schizophrenia (HCC)    Plan   ## Psychiatric Medication Recommendations:  Resume Prolixin 10 mg po daily for Psychosis.  Depakote ER 24 HRS 750 mg po at bed time.  Olanzapine Zydis 5 mg po prn every 6 hours for agitation and Ativan 1 mg orally every 6 hours.  ## Medical  Decision Making Capacity: Patient has a guardian and has thus been adjudicated incompetent; please involve patients guardian in medical decision making  ## Further Work-up:   EKG or U/A -- most recent EKG on 04/14/2023 had QtC of 499 -- Pertinent labwork reviewed earlier this admission includes: CMP, CBC, UDS, Alcohol level,   ## Disposition:-- We recommend inpatient psychiatric hospitalization after medical hospitalization. Patient has been involuntarily committed on 04/29/23.   ## Behavioral / Environmental: -Difficult Patient (SELECT OPTIONS FROM BELOW), Recommend using specific terminology regarding PNES, i.e. call the episodes "non-epileptic seizures" rather than "pseudoseizures" as the latter insinuates "fake" or "feigned" symptoms, when the events are a very real experience to the patient and are a physical, non-volitional, manifestation of fear, pain and anxiety. , or To minimize splitting of staff, assign one staff person to communicate all information from the team when feasible.    ## Safety and Observation Level:  - Based on my clinical evaluation, I estimate the patient to be at low risk of self harm in the current setting. - At this time, we recommend  routine. This decision is based on my review of the chart including patient's history and current presentation, interview of the patient, mental status examination, and consideration of suicide risk including evaluating suicidal ideation, plan, intent, suicidal or self-harm behaviors, risk factors, and protective factors. This judgment is based on our ability to directly address suicide risk, implement suicide prevention strategies, and develop a safety plan while the patient is in the clinical setting. Please contact our team if there is a concern that  risk level has changed.  CSSR Risk Category:C-SSRS RISK CATEGORY: No Risk  Suicide Risk Assessment: Patient has following modifiable risk factors for suicide: untreated depression,  under treated depression , recklessness, and lack of access to outpatient mental health resources, which we are addressing by inpatient Psychiatry hospitalization for stabilization.. Patient has following non-modifiable or demographic risk factors for suicide: psychiatric hospitalization Patient has the following protective factors against suicide: no history of suicide attempts  Thank you for this consult request. Recommendations have been communicated to the primary team.  We will seek inpatient Psychiatry placement at this time.   Molly Navy, NP-PMHNP-BC       History of Present Illness  Relevant Aspects of Hospital ED Course:  Admitted on 04/29/2023 for Agitation, aggression and disorganized thought process..  Female, AA well known ton the ER and area hospitals was brought in by [facility] from [facility] under IVC for evaluation and treatment for Psychosis and paranoia.  Patient presented to [facility] asking to get back on her Prolixin and Depakote.  Patient presented at [facility] for bizarre behavior and trespassing.   Patient was seen this morning disheveled, smelling of urine, wet and have been incontinent of urine all day.  Patient initially refused to speak with provider.  Later after receiving Ativan Orally patient slept and was awaken by staff to move to s different room.  Patient became angry, agitated and was soaked in urine.  Staff members managed to undress her and offered her a new scrub but she refused to shower stating she showered yesterday.  Patient remained confused, disorganized and and was difficult to redirect.  Finally she settled down but was unable to engage in any meaningful conversation. Patient requires inpatient Psychiatry hospitalization and has been accepted at [facility] for tomorrow morning.  She is well known to the facility.  Patient how ever is compliant with her Medications and ask for Prolixin and Depakote.  She is drinking and eating and tolerating same.  She has been accepted at  [facility] in am and we will contact transport for transfer tomorrow,.  Psych ROS:  Depression: ua Anxiety:  ua Mania (lifetime and current): yes Psychosis: (lifetime and current): yes  Collateral information: ua   Review of Systems  Reason unable to perform ROS: Patient is too Psychotic to engage in any meaningful coversation.     Psychiatric and Social History  Psychiatric History:  Information collected from Review of old and current records  Prev Dx/Sx: schizoaffective d/o bipolar ype, paranoid schizophrenia, medication noncompliance, multiple prior psychiatric admissions  Current Psych Provider: Unknown Home Meds (current): Prolixin 10 mg po daily for Psychosis.  Depakote ER 24 HRS 750 mg po at bed time.  Was receiving Prolixin injection in the past.  Had ACT Team in the past. 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 Living Situation: Homeless Spiritual Hx: ua Access to weapons/lethal means: no   Substance History Alcohol: ua  Type of alcohol ua Last Drink ua Number of drinks per day ua History of alcohol withdrawal seizures uya History of DT's ua Tobacco: ua Illicit drugs: ua Prescription drug abuse: ua Rehab hx: ua  Exam Findings  Physical Exam:  Vital Signs:  Temp:  [98.3 F (36.8 C)] 98.3 F (36.8 C) (12/28 1300) Pulse Rate:  [  118-120] 120 (12/28 1300) Resp:  [18-19] 19 (12/28 1300) BP: (134)/(76) 134/76 (12/28 1300) SpO2:  [100 %] 100 % (12/28 1300) Blood pressure 134/76, pulse (!) 120, temperature 98.3 F (36.8 C), resp. rate 19, SpO2 100%, unknown if currently breastfeeding. There is no height or weight on file to calculate BMI.  Physical Exam-Unable to assess, patient prevents  staff close to her.  Mental Status Exam: General Appearance: Disheveled and Malodorous  Orientation:  Full (Time, Place, and Person)  Memory:  Immediate;   Fair Recent;   Fair Remote;   Fair  Concentration:  Concentration: Poor and Attention Span: Poor  Recall:  Poor  Attention  Fair  Eye Contact:  Good  Speech:  Pressured  Language:  Fair  Volume:   varies from normal to yelling out  Mood: angry, labile  Affect:  Congruent, Labile, and Full Range  Thought Process:  Disorganized and Irrelevant  Thought Content:  Illogical, Paranoid Ideation, and Tangential  Suicidal Thoughts:  No  Homicidal Thoughts:  No  Judgement:  Poor  Insight:  Fair  Psychomotor Activity:  Psychomotor Retardation  Akathisia:  NA  Fund of Knowledge:  Fair      Assets:  Resilience Social Support  Cognition:  Impaired,  Moderate  ADL's:  Intact  AIMS (if indicated):        Other History   These have been pulled in through the EMR, reviewed, and updated if appropriate.  Family History:  The patient's family history includes Alcohol abuse in her brother; Bipolar disorder in her sister; Cancer in her father; Diabetes in her mother. She was adopted.  Medical History: Past Medical History:  Diagnosis Date   Abnormal Pap smear    ASC-cannot exclude HGSIL on Pap 02/15/2012   ASC-US on 02/03/2012 pap (associated Trichomonas infection). No reflex HPV testing performed on specimen.  Patient informed that she will need repeat Pap in one year.       Asthma    ATTENTION DEFICIT, W/O HYPERACTIVITY, History of 06/30/2006   Qualifier: History of  By: McDiarmid MD, Jae Dire, GONOCOCCAL, History of 01/09/2007   Qualifier: History of  By: McDiarmid MD, Charlann Boxer ACUMINATA, HISTORY OF 05/12/2009   Qualifier: History of  By: McDiarmid MD, Todd     Depression    Diabetes mellitus    diet controlled   Eczema    Hypertension    Overactive bladder    Schizophrenia (HCC)    SCHIZOPHRENIA, CATATONIC,  HISTORY OF 12/13/2006   Annotation: Diagnoses by  Dr. Dennie Bible (Psych) At [facility] in  [place], [place]. 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   Qualifier: Diagnosis of  By: McDiarmid MD, Benjaman Pott USER 02/08/2009   Qualifier: Diagnosis of  By: Knox Royalty      Surgical History: Past Surgical History:  Procedure Laterality Date   INCISION AND DRAINAGE     pilanodal cyst   TIBIA IM NAIL INSERTION Right 10/26/2021   Procedure: INTRAMEDULLARY (IM) NAIL TIBIAL;  Surgeon: Myrene Galas, MD;  Location: [facility] OR;  Service: Orthopedics;  Laterality: Right;   TOOTH EXTRACTION N/A 10/07/2017   Procedure: EXTRACTION TEETH NUMBERS ONE, SEVENTEEN, NINETEEN AND THIRTY TWO;  Surgeon: Ocie Doyne, DDS;  Location: [facility] OR;  Service: Oral Surgery;  Laterality: N/A;     Medications:   Current Facility-Administered Medications:    diphenhydrAMINE (BENADRYL)  capsule 50 mg, 50 mg, Oral, Q6H PRN, 50 mg at 04/30/23 0839 **OR** diphenhydrAMINE (BENADRYL) injection 50 mg, 50 mg, Intramuscular, Q6H PRN, Carrion-Carrero, Margely, MD   divalproex (DEPAKOTE ER) 24 hr tablet 750 mg, 750 mg, Oral, QHS, Jewelene Mairena C, NP   fluPHENAZine (PROLIXIN) tablet 10 mg, 10 mg, Oral, QHS, Lasharon Dunivan C, NP   LORazepam (ATIVAN) tablet 1 mg, 1 mg, Oral, Q6H PRN, Carrion-Carrero, Margely, MD, 1 mg at 04/29/23 1550   OLANZapine zydis (ZYPREXA) disintegrating tablet 10 mg, 10 mg, Oral, Q8H PRN, 10 mg at 04/30/23 0840 **AND** LORazepam (ATIVAN) tablet 1 mg, 1 mg, Oral, PRN **AND** [COMPLETED] ziprasidone (GEODON) injection 20 mg, 20 mg, Intramuscular, PRN, Durwin Glaze, MD, 20 mg at 04/30/23 1607   OLANZapine zydis (ZYPREXA) disintegrating tablet 5 mg, 5 mg, Oral, Q6H PRN, Carrion-Carrero, Margely, MD, 5 mg at 04/29/23 1550   ondansetron (ZOFRAN) tablet 4 mg, 4 mg, Oral, Q8H PRN, Durwin Glaze, MD   ziprasidone (GEODON) injection 20 mg, 20 mg,  Intramuscular, Q2H PRN, Durwin Glaze, MD  Current Outpatient Medications:    acetaminophen (TYLENOL) 500 MG tablet, Take 500-1,000 mg by mouth every 6 (six) hours as needed for mild pain (pain score 1-3) or headache., Disp: , Rfl:    albuterol (VENTOLIN HFA) 108 (90 Base) MCG/ACT inhaler, Inhale 1-2 puffs into the lungs every 4 (four) hours as needed for wheezing or shortness of breath., Disp: , Rfl:    cephALEXin (KEFLEX) 500 MG capsule, Take 1 capsule (500 mg total) by mouth 4 (four) times daily., Disp: 20 capsule, Rfl: 0   divalproex (DEPAKOTE ER) 500 MG 24 hr tablet, Take 1 tablet (500 mg total) by mouth daily., Disp: 30 tablet, Rfl: 0   fluPHENAZine (PROLIXIN) 10 MG tablet, Take 10 mg by mouth at bedtime., Disp: , Rfl:    fluPHENAZine decanoate (PROLIXIN) 25 MG/ML injection, Inject 25 mg into the muscle every 21 ( twenty-one) days., Disp: , Rfl:    lithium carbonate (LITHOBID) 300 MG ER tablet, Take 300 mg by mouth 2 (two) times daily., Disp: , Rfl:    OLANZapine (ZYPREXA) 10 MG tablet, Take 1 tablet (10 mg total) by mouth at bedtime., Disp: 30 tablet, Rfl: 0  Allergies: Allergies  Allergen Reactions   Abilify [Aripiprazole] Other (See Comments)    Goes into a "zombie-like state." Thinks it's "nasty" and does not want it.  Injection is ok.      Molly Navy, NP

## 2023-04-30 NOTE — Progress Notes (Signed)
[facility] [name] Progress Note   04/30/2023    11:01 AM  Biagio Borg   [id number]   Type of Contact and Topic:  Psychiatric Bed Placement   Pt accepted to [facility]     Patient meets inpatient criteria per Alona Bene, [facility]  The attending provider will be Dr. Loni Beckwith   Call report to [phone]  Marcelo Baldy, RN @ [facility] ED notified.     Pt scheduled  to arrive at [facility].    Damita Dunnings, MSW, [name]-A  11:03 AM 04/30/2023

## 2023-04-30 NOTE — ED Notes (Signed)
Pt continues to rest. 

## 2023-04-30 NOTE — Consult Note (Signed)
Attempt to evaluate patient failed due to patient refusing to engage with Provider.  Patient had her whole body covered with blanket and did not respond to questions.  Patient then started crying and would not respond to question to what is happening.  Provider will tray again later.  Ativan prescribed for anxiety.  Depakote and Fluphenazine her home Medications prescribed.  Chart reviewed she was taking Fluphenazine orally and biweekly injection in the past.  Provider will reach out to her ACT TEAM Services.

## 2023-05-01 DIAGNOSIS — F201 Disorganized schizophrenia: Secondary | ICD-10-CM

## 2023-05-01 DIAGNOSIS — R451 Restlessness and agitation: Secondary | ICD-10-CM | POA: Diagnosis not present

## 2023-05-01 MED ORDER — LORAZEPAM 2 MG/ML IJ SOLN
2.0000 mg | Freq: Once | INTRAMUSCULAR | Status: AC
Start: 2023-05-01 — End: 2023-05-01
  Administered 2023-05-01: 2 mg via INTRAMUSCULAR
  Filled 2023-05-01: qty 1

## 2023-05-01 MED ORDER — DROPERIDOL 2.5 MG/ML IJ SOLN
2.5000 mg | Freq: Once | INTRAMUSCULAR | Status: AC
  Administered 2023-05-01: 2.5 mg via INTRAMUSCULAR
  Filled 2023-05-01 (×2): qty 2

## 2023-05-01 MED ORDER — DIPHENHYDRAMINE HCL 50 MG/ML IJ SOLN
25.0000 mg | Freq: Once | INTRAMUSCULAR | Status: AC
  Administered 2023-05-01: 25 mg via INTRAMUSCULAR
  Filled 2023-05-01: qty 1

## 2023-05-01 NOTE — ED Notes (Signed)
Called [facility] to give report     no answer after a couple of attempts

## 2023-05-01 NOTE — ED Notes (Signed)
2x we have attempted to change this pt sheets. Pt still refuses to let both the RN and this tech change them.

## 2023-05-01 NOTE — ED Notes (Signed)
Tried to encourage pt to take a shower, change clothes and bed linen but pt refused all of the above.

## 2023-05-01 NOTE — ED Notes (Signed)
Pt initally refused to take meds and allow for vitals. She states she is ready to go. RN explained to her that being cooperative and following instructions is helpful to her getting to leave. She then took meds and allowed for vitals. Transported over to SAPU due to refusal to walk. Staff transferred her over to bed. Sheets and blankets soaked in urine. PT wanting to keep them but staff refused. Informed her she needs to stay sanitary. Pt is yelling and screaming at this time.

## 2023-05-01 NOTE — ED Notes (Addendum)
Pt brought back to SAPPU in bed due to Pt refusing to get up out of bed and walk back to SAPPU. Pt had urinated in bed in TCU, and was trying to pull urine soaked blankets with her to new bed. Pt began to scream loudly and cuss at staff when they took urine soaked blankets away

## 2023-05-01 NOTE — ED Notes (Signed)
Pt continues to scream loudly, visibly upset and uncomfortable. Pt verbally aggressive with staff after showering.

## 2023-05-01 NOTE — ED Notes (Signed)
°  04/30/2023    11:01 AM   Biagio Borg    [id number]    Type of Contact and Topic:  Psychiatric Bed Placement    Pt accepted to [facility]      Patient meets inpatient criteria per Alona Bene, [facility]   The attending provider will be Dr. Loni Beckwith    Call report to [phone]   Marcelo Baldy, RN @ [facility] ED notified.      Pt scheduled  to arrive at [facility].      Damita Dunnings, MSW, [name]-A  11:03 AM 12/28/202

## 2023-05-01 NOTE — ED Provider Notes (Signed)
°  CRITICAL CARE Performed by: Sloan Leiter   Total critical care time: 31 minutes  Critical care time was exclusive of separately billable procedures and treating other patients.  Critical care was necessary to treat or prevent imminent or life-threatening deterioration.  Critical care was time spent personally by me on the following activities: development of treatment plan with patient and/or surrogate as well as nursing, discussions with consultants, evaluation of patient's response to treatment, examination of patient, obtaining history from patient or surrogate, ordering and performing treatments and interventions, ordering and review of laboratory studies, ordering and review of radiographic studies, pulse oximetry and re-evaluation of patient's condition. Acute psychosis. Pt acutely agitated, difficult to redirect. Given droperidol, ativan, benadryl. Appropriate response. She is pending transfer to [facility] hopefully later today. HDS    Sloan Leiter, DO 05/01/23 [phone]

## 2023-05-01 NOTE — Progress Notes (Signed)
CSW spoke with [place] the Intake RN at [facility], who confirmed receiving [facility] referral. Per Everlena Cooper, the patient continues to be accepted for INP. Everlena Cooper has requested the ED RN to re-call report.    Damita Dunnings, MSW, [name]-A  5:17 PM 05/01/2023

## 2023-05-01 NOTE — ED Notes (Signed)
Number called again and still no answer

## 2023-05-01 NOTE — ED Notes (Signed)
Pt did allow staff to give her meds despite resisting at first.

## 2023-05-01 NOTE — ED Notes (Addendum)
Changed pt linens with assistance of tech and another RN and security x 3. Pt now demanding a bar of soap. Explained to pt that we only have body wash and provided this to pt. Pt requesting a toothbrush and toothpaste. Provided this for pt after she gave me the old toothbrush and paste. Pt began screaming and crying, asking why she can't keep her old one.

## 2023-05-01 NOTE — ED Notes (Signed)
[facility] staff unable to find paperwork on this pt, will have to be re-faxed before report can be given

## 2023-05-01 NOTE — ED Notes (Signed)
Attempted again to call report to [facility], no answer. Left message

## 2023-05-01 NOTE — Consult Note (Signed)
[facility] Psychiatric Consult Initial  Patient Name: .Molly [name]  MRN: [id number]  DOB: [birth year]  Consult Order details:  Orders (From admission, onward)     Start     Ordered   04/29/23 1909  CONSULT TO CALL ACT TEAM       Ordering Provider: Durwin Glaze, MD  Provider:  (Not yet assigned)  Question:  Reason for Consult?  Answer:  Psych consult   04/29/23 1911             Mode of Visit: In person    Psychiatry Consult Evaluation  Service Date: May 01, 2023 LOS:  LOS: 0 days  Chief Complaint Agitation, disorganized  Primary Psychiatric Diagnoses  Schizophrenia, Disorganized type  Assessment  Molly [name] is a 37 y.o. female admitted: Presented to the [facility] 04/29/2023  3:23 PM for agitation. She carries the psychiatric diagnoses of Schizophrenia, Borderline Intellectual Functioning, Cannabis use disorder, homelessness and has a past medical history of  DM, Eczema Atopic Dermatitis, Obesity, .   Her current presentation of agitation is most consistent with her diagnosis of Schizophrenia. She meets criteria for inpatient Psychiatry hospitalization based on cognitive impairment, disorganized states of mind, .  Current outpatient psychotropic medications include Depakote, Prolixin  and Olanzapine and historically she has had a positive  response to these medications. She was not compliant with medications prior to admission as evidenced by her agitation, cognitive impairment, disorganized thought.. On initial examination, patient is Psychotic, confused and disorganized.. Please see plan below for detailed recommendations.   Diagnoses:  Active Hospital problems: Principal Problem:   Disorganized schizophrenia (HCC)    Plan   ## Psychiatric Medication Recommendations:  Continue Prolixin 10 mg po daily for Psychosis.  Depakote ER 24 HRS 750 mg po at bed time.  Olanzapine Zydis 5 mg po prn every 6 hours for agitation and Ativan 1 mg orally every 6 hours.  ## Medical  Decision Making Capacity: Patient has a guardian and has thus been adjudicated incompetent; please involve patients guardian in medical decision making  ## Further Work-up:   EKG -- most recent EKG on 04/14/2023 had QtC of 499 -- Pertinent labwork reviewed earlier this admission includes: CMP, CBC, UDS, Alcohol level,   ## Disposition:-- We recommend inpatient psychiatric hospitalization after medical hospitalization. Patient has been involuntarily committed on 04/29/23.   ## Behavioral / Environmental: -Difficult Patient (SELECT OPTIONS FROM BELOW), To minimize splitting of staff, assign one staff person to communicate all information from the team when feasible., or Utilize compassion and acknowledge the patient's experiences while setting clear and realistic expectations for care.    ## Safety and Observation Level:  - Based on my clinical evaluation, I estimate the patient to be at low risk of self harm in the current setting. - At this time, we recommend  routine. This decision is based on my review of the chart including patient's history and current presentation, interview of the patient, mental status examination, and consideration of suicide risk including evaluating suicidal ideation, plan, intent, suicidal or self-harm behaviors, risk factors, and protective factors. This judgment is based on our ability to directly address suicide risk, implement suicide prevention strategies, and develop a safety plan while the patient is in the clinical setting. Please contact our team if there is a concern that risk level has changed.  CSSR Risk Category:C-SSRS RISK CATEGORY: No Risk  Suicide Risk Assessment: Patient has following modifiable risk factors for suicide: untreated depression, under treated depression , recklessness, and lack  of access to outpatient mental health resources, which we are addressing by inpatient Psychiatry hospitalization for stabilization.. Patient has following  non-modifiable or demographic risk factors for suicide: psychiatric hospitalization Patient has the following protective factors against suicide: no history of suicide attempts  Thank you for this consult request. Recommendations have been communicated to the primary team.  We will seek inpatient Psychiatry placement at this time.   Earney Navy, NP-PMHNP-BC       History of Present Illness  Relevant Aspects of Hospital ED Course:  Admitted on 04/29/2023 for Agitation, aggression and disorganized thought process..  Female, AA well known ton the ER and area hospitals was brought in by [facility] from [facility] under IVC for evaluation and treatment for Psychosis and paranoia.   Patient is being faxed to [facility] in [place] for bed placement and we are waiting to hear from them.  Patient has been tearful off and on today and was Medicated with Ativan for anxiety.  Patient is very much Medication compliant.  She knows the names of her Medications and comes  to the window to ask for them.  She had a shower last night and she is eating well.  Her mood remains Labile as she occasionally yells out and ask why she is going inpatient.  Patient lacks insight in her Mental illness, remains homeless and is difficult to be found where she stays by her ACT Team.  Although patient takes her Medications here when she leaves the hospital and becomes homeless she losses her Medications.  [facility] is considering request for bed at their Inpatient Psychiatry unit.  We will continue to monitor patient. Psych ROS:  Depression: ua Anxiety:  ua Mania (lifetime and current): yes Psychosis: (lifetime and current): yes  Collateral information: ua   Review of Systems  Reason unable to perform ROS: Patient is too Psychotic to engage in any meaningful coversation.     Psychiatric and Social History  Psychiatric History:  Information collected from Review of old and current records  Prev Dx/Sx: schizoaffective d/o bipolar ype,  paranoid schizophrenia, medication noncompliance, multiple prior psychiatric admissions  Current Psych Provider: Unknown Home Meds (current): Prolixin 10 mg po daily for Psychosis.  Depakote ER 24 HRS 750 mg po at bed time.  Was receiving Prolixin injection in the past.  Had ACT Team in the past. 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 Living Situation: Homeless Spiritual Hx: ua Access to weapons/lethal means: no   Substance History Alcohol: ua  Type of alcohol ua Last Drink ua Number of drinks per day ua History of alcohol withdrawal seizures uya History of DT's ua Tobacco: ua Illicit drugs: ua Prescription drug abuse: ua Rehab hx: ua  Exam Findings  Physical Exam:  Vital Signs:  Temp:  [97.8 F (36.6 C)-98.7 F (37.1 C)] 97.8 F (36.6 C) (12/29 1529) Pulse Rate:  [84-115] 84 (12/29 1529) Resp:  [19-20] 20 (12/29 1529) BP: (115-142)/(72-91) 115/72 (12/29 1529) SpO2:  [99 %-100 %] 99 % (12/29 1529) Blood pressure 115/72, pulse 84, temperature 97.8 F (36.6 C), temperature source Axillary, resp. rate 20, SpO2 99%, unknown if currently breastfeeding. There is no height or weight on file to calculate BMI.  Physical Exam-Unable to assess, patient is very much Psychotic to allow staff  come near her or to engage in any meaningful conversation.  Mental Status Exam: General Appearance: Disheveled and Malodorous  Orientation:  Full (Time, Place, and Person)  Memory:  Immediate;   Fair Recent;   Fair Remote;   Fair  Concentration:  Concentration: Poor and Attention Span: Poor  Recall:  Poor  Attention  Fair  Eye Contact:  Good  Speech:  Pressured  Language:  Fair  Volume:   varies from normal to  yelling out  Mood: angry, labile  Affect:  Congruent, Labile, and Full Range  Thought Process:  Disorganized and Irrelevant  Thought Content:  Illogical, Paranoid Ideation, and Tangential  Suicidal Thoughts:  No  Homicidal Thoughts:  No  Judgement:  Poor  Insight:  Fair  Psychomotor Activity:  Psychomotor Retardation  Akathisia:  NA  Fund of Knowledge:  Fair      Assets:  Resilience Social Support  Cognition:  Impaired,  Moderate  ADL's:  Intact  AIMS (if indicated):        Other History   These have been pulled in through the EMR, reviewed, and updated if appropriate.  Family History:  The patient's family history includes Alcohol abuse in her brother; Bipolar disorder in her sister; Cancer in her father; Diabetes in her mother. She was adopted.  Medical History: Past Medical History:  Diagnosis Date   Abnormal Pap smear    ASC-cannot exclude HGSIL on Pap 02/15/2012   ASC-US on 02/03/2012 pap (associated Trichomonas infection). No reflex HPV testing performed on specimen.  Patient informed that she will need repeat Pap in one year.       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 Dennie Bible (Psych) At [facility] in  [place], [place]. 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   Qualifier: Diagnosis of  By: McDiarmid MD, Benjaman Pott USER 02/08/2009   Qualifier: Diagnosis of  By: Knox Royalty      Surgical History: Past Surgical  History:  Procedure Laterality Date   INCISION AND DRAINAGE     pilanodal cyst   TIBIA IM NAIL INSERTION Right 10/26/2021   Procedure: INTRAMEDULLARY (IM) NAIL TIBIAL;  Surgeon: Myrene Galas, MD;  Location: [facility] OR;  Service: Orthopedics;  Laterality: Right;   TOOTH EXTRACTION N/A 10/07/2017   Procedure: EXTRACTION TEETH NUMBERS ONE, SEVENTEEN, NINETEEN AND THIRTY TWO;  Surgeon: Ocie Doyne, DDS;  Location: [facility] OR;  Service: Oral Surgery;  Laterality: N/A;     Medications:   Current Facility-Administered Medications:    diphenhydrAMINE (BENADRYL) capsule 50 mg, 50 mg, Oral, Q6H PRN, 50 mg at 04/30/23 0839 **OR** diphenhydrAMINE (BENADRYL) injection 50 mg, 50 mg, Intramuscular, Q6H PRN, Carrion-Carrero, Margely, MD   divalproex (DEPAKOTE ER) 24 hr tablet 750 mg, 750 mg, Oral, QHS, Kahli Fitzgerald C, NP, 750 mg at 04/30/23 2347   fluPHENAZine (PROLIXIN) tablet  10 mg, 10 mg, Oral, QHS, Shila Kruczek C, NP, 10 mg at 04/30/23 2346   LORazepam (ATIVAN) tablet 1 mg, 1 mg, Oral, Q6H PRN, Carrion-Carrero, Margely, MD, 1 mg at 04/29/23 1550   OLANZapine zydis (ZYPREXA) disintegrating tablet 10 mg, 10 mg, Oral, Q8H PRN, 10 mg at 05/01/23 0931 **AND** LORazepam (ATIVAN) tablet 1 mg, 1 mg, Oral, PRN **AND** [COMPLETED] ziprasidone (GEODON) injection 20 mg, 20 mg, Intramuscular, PRN, Durwin Glaze, MD, 20 mg at 04/30/23 1607   OLANZapine zydis (ZYPREXA) disintegrating tablet 5 mg, 5 mg, Oral, Q6H PRN, Carrion-Carrero, Margely, MD, 5 mg at 04/29/23 1550   ondansetron (ZOFRAN) tablet 4 mg, 4 mg, Oral, Q8H PRN, Durwin Glaze, MD   ziprasidone (GEODON) injection 20 mg, 20 mg, Intramuscular, Q2H PRN, Durwin Glaze, MD  Current Outpatient Medications:    divalproex (DEPAKOTE ER) 500 MG 24 hr tablet, Take 1 tablet (500 mg total) by mouth daily. (Patient taking differently: Take 1,000 mg by mouth in the morning.), Disp: 30 tablet, Rfl: 0   fluPHENAZine (PROLIXIN) 10 MG tablet, Take 10 mg by mouth daily.,  Disp: , Rfl:    fluPHENAZine decanoate (PROLIXIN) 25 MG/ML injection, Inject 50 mg into the muscle every 21 ( twenty-one) days., Disp: , Rfl:    acetaminophen (TYLENOL) 500 MG tablet, Take 500-1,000 mg by mouth every 6 (six) hours as needed for mild pain (pain score 1-3) or headache., Disp: , Rfl:    albuterol (VENTOLIN HFA) 108 (90 Base) MCG/ACT inhaler, Inhale 1-2 puffs into the lungs every 4 (four) hours as needed for wheezing or shortness of breath., Disp: , Rfl:    cephALEXin (KEFLEX) 500 MG capsule, Take 1 capsule (500 mg total) by mouth 4 (four) times daily., Disp: 20 capsule, Rfl: 0   OLANZapine (ZYPREXA) 10 MG tablet, Take 1 tablet (10 mg total) by mouth at bedtime. (Patient not taking: Reported on 05/01/2023), Disp: 30 tablet, Rfl: 0  Allergies: Allergies  Allergen Reactions   Abilify [Aripiprazole] Other (See Comments)    Goes into a "zombie-like state." Thinks it's "nasty" and does not want it.  Injection is ok.      Earney Navy, NP-PMHNP-BC

## 2023-05-01 NOTE — ED Notes (Signed)
Sheriffs dept called    they have several transports this morning but they will get her at some point today   they will call when they are enroute here

## 2023-05-01 NOTE — Progress Notes (Signed)
CSW submitted BH referral to [facility] for review.    Damita Dunnings, MSW, [name]-A  4:39 PM 05/01/2023

## 2023-05-01 NOTE — ED Notes (Signed)
Pt now requesting to take a shower. RN provided personal hygiene items and change of scrubs and towels. Encouraged to practice hygiene.

## 2023-05-01 NOTE — ED Notes (Signed)
Pt crying loudly, screaming out. PRN meds given at this time. Pt bed is visibly wet and pt smells of urine. Pt refusing to change or to allow linens to be changed. Pt says " these sheets are clean, and I just changed clothes".

## 2023-05-01 NOTE — ED Notes (Signed)
Pt belongings placed in locker 43, pt also brought a rolling basket from [facility], is sitting outside of locker 43.

## 2023-05-01 NOTE — ED Provider Notes (Signed)
Emergency Medicine Observation Re-evaluation Note  Molly [name] is a 37 y.o. female, seen on rounds today.  Pt initially presented to the ED for complaints of IVC Currently, the patient is walking in her exam room without acute distress.  Physical Exam  BP (!) 142/91 (BP Location: Right Arm)   Pulse (!) 115   Temp 98.7 F (37.1 C) (Oral)   Resp 19   SpO2 100%  Physical Exam General: Standing up without agitation Cardiac: No murmur on exam Lungs: Breath sounds clear bilaterally Psych: No acute agitation  ED Course / MDM  EKG:   I have reviewed the labs performed to date as well as medications administered while in observation.  Recent changes in the last 24 hours include had to be sedated overnight but otherwise none reported by nursing.  Plan  Current plan is for awaiting placement.    Deland Slocumb, Canary Brim, MD 05/01/23 [phone]

## 2023-05-02 DIAGNOSIS — R451 Restlessness and agitation: Secondary | ICD-10-CM | POA: Diagnosis not present

## 2023-05-02 LAB — CBG MONITORING, ED: Glucose-Capillary: 109 mg/dL — ABNORMAL HIGH (ref 70–99)

## 2023-05-02 NOTE — ED Notes (Signed)
Patient off unit to [facility] per provider. Patient alert and no s/s of distress  at this time.  Discharge information and belongings given to sheriff for transport. Patient ambulatory off unit, escorted and transported by [facility].

## 2023-05-02 NOTE — ED Notes (Signed)
Pt urinated in bed but is refusing to have linens changed.  Believes someone urinated on her while she was sleeping.

## 2023-05-02 NOTE — ED Notes (Signed)
Pt refuse me and the nurse to change her dirty linen, because it smells like urine, pt states she can do it herself and gets aggressive

## 2023-07-14 ENCOUNTER — Other Ambulatory Visit (HOSPITAL_COMMUNITY): Payer: Self-pay

## 2023-07-20 ENCOUNTER — Other Ambulatory Visit: Payer: Self-pay

## 2023-07-20 ENCOUNTER — Emergency Department (HOSPITAL_COMMUNITY)
Admission: EM | Admit: 2023-07-20 | Discharge: 2023-07-20 | Discharge status: Against Medical Advice | Attending: Emergency Medicine | Admitting: Emergency Medicine

## 2023-07-20 ENCOUNTER — Encounter (HOSPITAL_COMMUNITY): Payer: Self-pay | Admitting: *Deleted

## 2023-07-20 ENCOUNTER — Encounter (HOSPITAL_COMMUNITY): Payer: Self-pay | Admitting: Emergency Medicine

## 2023-07-20 ENCOUNTER — Emergency Department (HOSPITAL_COMMUNITY)
Admission: EM | Admit: 2023-07-20 | Discharge: 2023-07-20 | Discharge status: Against Medical Advice | Source: Home / Self Care

## 2023-07-20 DIAGNOSIS — Z5900 Homelessness unspecified: Secondary | ICD-10-CM | POA: Diagnosis not present

## 2023-07-20 DIAGNOSIS — M79673 Pain in unspecified foot: Secondary | ICD-10-CM | POA: Insufficient documentation

## 2023-07-20 DIAGNOSIS — Z5321 Procedure and treatment not carried out due to patient leaving prior to being seen by health care provider: Secondary | ICD-10-CM | POA: Insufficient documentation

## 2023-07-20 NOTE — ED Triage Notes (Signed)
 Apparently this pt was just discharged from this ed and turned around and checked back in  she reports she wants a physical and some meds very very poor hygiene  she smells horrible

## 2023-07-20 NOTE — ED Notes (Signed)
 Pt eloped from ed lobby

## 2023-07-20 NOTE — ED Notes (Signed)
 Pt decided to urinate in floor next to the vending machines in the waiting room

## 2023-07-20 NOTE — ED Triage Notes (Signed)
 Pt states she is homeless, hasn't had meds and her feet hurt from walking. Pt peed all over triage floor.

## 2023-07-20 NOTE — ED Notes (Signed)
 PT left before discharge vitals or paperwork could be given.

## 2023-07-20 NOTE — ED Provider Triage Note (Signed)
 Emergency Medicine Provider Triage Evaluation Note  Molly [name] , a 38 y.o. female  was evaluated in triage.  Pt complains of "feet pain" and "out of antipsychotics".  Was just seen for same.  Patient sleeping in unwilling to answer any other questions  Review of Systems  Positive: Unable to assess Negative: Unable to assess  Physical Exam  Ht 5\' 8"  (1.727 m)   Wt 119 kg   LMP 06/22/2023   BMI 39.89 kg/m  Gen:   Sleeping, no distress Resp:  Normal effort  MSK:   Moves extremities without difficulty  Other:    Medical Decision Making  Medically screening exam initiated at 3:40 PM.  Appropriate orders placed.  Molly [name] was informed that the remainder of the evaluation will be completed by another provider, this initial triage assessment does not replace that evaluation, and the importance of remaining in the ED until their evaluation is complete.     Dolphus Jenny, PA-C 07/20/23 1549

## 2023-07-20 NOTE — ED Provider Notes (Signed)
 [place] EMERGENCY DEPARTMENT AT [facility] Provider Note   CSN: [id number] Arrival date & time: 07/20/23  0915     History  Chief Complaint  Patient presents with   Homeless    Molly [name] is a 38 y.o. female.  HPI Patient presents with reported concern of sore feet, but when asked patient why she is here, how she feels she replies" fine".  She does not offer any specific complaints, any specific concerns.  Chart review notable for 25 visits in the past 6 months.    Home Medications Prior to Admission medications   Medication Sig Start Date End Date Taking? Authorizing Provider  acetaminophen (TYLENOL) 500 MG tablet Take 500-1,000 mg by mouth every 6 (six) hours as needed for mild pain (pain score 1-3) or headache.    [provider]  albuterol (VENTOLIN HFA) 108 (90 Base) MCG/ACT inhaler Inhale 1-2 puffs into the lungs every 4 (four) hours as needed for wheezing or shortness of breath.    [provider]  cephALEXin (KEFLEX) 500 MG capsule Take 1 capsule (500 mg total) by mouth 4 (four) times daily. 04/23/23   Schutt, Edsel Petrin, PA-C  divalproex (DEPAKOTE ER) 500 MG 24 hr tablet Take 1 tablet (500 mg total) by mouth daily. Patient taking differently: Take 1,000 mg by mouth in the morning. 04/02/23   Sabas Sous, MD  fluPHENAZine (PROLIXIN) 10 MG tablet Take 10 mg by mouth daily. 05/25/22   [provider]  fluPHENAZine decanoate (PROLIXIN) 25 MG/ML injection Inject 50 mg into the muscle every 21 ( twenty-one) days.    [provider]  OLANZapine (ZYPREXA) 10 MG tablet Take 1 tablet (10 mg total) by mouth at bedtime. Patient not taking: Reported on 05/01/2023 04/02/23   Sabas Sous, MD      Allergies    Abilify [aripiprazole]    Review of Systems   Review of Systems  Physical Exam Updated Vital Signs BP (!) 133/98   Pulse (!) 104   Temp 98.5 F (36.9 C) (Oral)   Resp 17   Ht 5\' 8"  (1.727 m)   Wt 119 kg    SpO2 100%   BMI 39.89 kg/m  Physical Exam Vitals and nursing note reviewed.  Constitutional:      General: She is not in acute distress.    Appearance: She is well-developed.  HENT:     Head: Normocephalic and atraumatic.  Eyes:     Conjunctiva/sclera: Conjunctivae normal.  Cardiovascular:     Rate and Rhythm: Normal rate and regular rhythm.  Pulmonary:     Effort: Pulmonary effort is normal. No respiratory distress.     Breath sounds: Normal breath sounds. No stridor.  Abdominal:     General: There is no distension.  Skin:    General: Skin is warm and dry.  Neurological:     Mental Status: She is alert and oriented to person, place, and time.     Cranial Nerves: No cranial nerve deficit.     Comments: Patient follows commands, flex each hip appropriately, is subsequently ambulatory, no distress, moving all extremity spontaneously  Psychiatric:        Behavior: Behavior is slowed and withdrawn.     ED Results / Procedures / Treatments   Labs (all labs ordered are listed, but only abnormal results are displayed) Labs Reviewed - No data to display  EKG None  Radiology No results found.  Procedures Procedures    Medications Ordered in  ED Medications - No data to display  ED Course/ Medical Decision Making/ A&P                                 Medical Decision Making Adult female with history of multiple medical problems presents with vague complaints.  Patient is awake, alert, moving all extremity spontaneously, aside from trivial tachycardia, she is hemodynamically stable has little evidence for acute new phenomena.  Patient is ambulatory, leaves prior to repeat evaluation, though the initial 1 was reassuring.  Amount and/or Complexity of Data Reviewed External Data Reviewed: notes.  Risk Decision regarding hospitalization. Diagnosis or treatment significantly limited by social determinants of health.  Final Clinical Impression(s) / ED Diagnoses Final  diagnoses:  Homeless    Rx / DC Orders ED Discharge Orders     None         Gerhard Munch, MD 07/20/23 1305

## 2023-07-22 ENCOUNTER — Emergency Department (HOSPITAL_COMMUNITY)
Admission: EM | Admit: 2023-07-22 | Discharge: 2023-07-22 | Disposition: A | Discharge status: Home / Self Care | Attending: Emergency Medicine | Admitting: Emergency Medicine

## 2023-07-22 ENCOUNTER — Encounter (HOSPITAL_COMMUNITY): Payer: Self-pay

## 2023-07-22 ENCOUNTER — Other Ambulatory Visit: Payer: Self-pay

## 2023-07-22 DIAGNOSIS — M79671 Pain in right foot: Secondary | ICD-10-CM | POA: Diagnosis present

## 2023-07-22 DIAGNOSIS — M79672 Pain in left foot: Secondary | ICD-10-CM | POA: Insufficient documentation

## 2023-07-22 NOTE — ED Triage Notes (Signed)
 Patient arrives via EMS. States she wants her medication changed. States she has spoken to her PCP about it but it has not been changed. No other complaints.

## 2023-07-22 NOTE — ED Provider Notes (Signed)
 [place] EMERGENCY DEPARTMENT AT [facility] Provider Note   CSN: [id number] Arrival date & time: 07/22/23  1660     History  No chief complaint on file.   Molly [name] is a 38 y.o. female presents today for sore feet, most recently seen 2 days ago for same.  When asked where on her feet her patient states "everywhere".  Patient does not offer any other complaints or answer any other questions.  Patient has been seen 28 times in the last 6 months.  HPI     Home Medications Prior to Admission medications   Medication Sig Start Date End Date Taking? Authorizing Provider  acetaminophen (TYLENOL) 500 MG tablet Take 500-1,000 mg by mouth every 6 (six) hours as needed for mild pain (pain score 1-3) or headache.    [provider]  albuterol (VENTOLIN HFA) 108 (90 Base) MCG/ACT inhaler Inhale 1-2 puffs into the lungs every 4 (four) hours as needed for wheezing or shortness of breath.    [provider]  cephALEXin (KEFLEX) 500 MG capsule Take 1 capsule (500 mg total) by mouth 4 (four) times daily. 04/23/23   Schutt, Edsel Petrin, PA-C  divalproex (DEPAKOTE ER) 500 MG 24 hr tablet Take 1 tablet (500 mg total) by mouth daily. Patient taking differently: Take 1,000 mg by mouth in the morning. 04/02/23   Sabas Sous, MD  fluPHENAZine (PROLIXIN) 10 MG tablet Take 10 mg by mouth daily. 05/25/22   [provider]  fluPHENAZine decanoate (PROLIXIN) 25 MG/ML injection Inject 50 mg into the muscle every 21 ( twenty-one) days.    [provider]  OLANZapine (ZYPREXA) 10 MG tablet Take 1 tablet (10 mg total) by mouth at bedtime. Patient not taking: Reported on 05/01/2023 04/02/23   Sabas Sous, MD      Allergies    Abilify [aripiprazole]    Review of Systems   Review of Systems  Skin:  Positive for color change.    Physical Exam Updated Vital Signs LMP 06/22/2023   SpO2 99%  Physical Exam Constitutional:      General: She is not in  acute distress.    Appearance: Normal appearance. She is obese. She is not ill-appearing, toxic-appearing or diaphoretic.  HENT:     Head: Normocephalic and atraumatic.     Right Ear: External ear normal.     Left Ear: External ear normal.     Nose: Nose normal.  Cardiovascular:     Rate and Rhythm: Normal rate and regular rhythm.     Pulses: Normal pulses.  Pulmonary:     Effort: Pulmonary effort is normal.  Musculoskeletal:        General: Normal range of motion.     Cervical back: Normal range of motion.     Comments: +2 dorsalis pedis pulses.  Patient is neurovascularly intact in bilateral lower extremities.  Patient's bilateral feet noted to have poor hygiene, however no open wounds/ulcers, fungal infection, erythema, warmth, or other signs of infection.  Patient does have bilateral lower extremity edema without pitting.  Skin:    General: Skin is warm and dry.     Capillary Refill: Capillary refill takes less than 2 seconds.  Neurological:     General: No focal deficit present.  Psychiatric:        Mood and Affect: Mood normal.     ED Results / Procedures / Treatments   Labs (all labs ordered are listed, but only abnormal results are displayed) Labs  Reviewed - No data to display  EKG None  Radiology No results found.  Procedures Procedures    Medications Ordered in ED Medications - No data to display  ED Course/ Medical Decision Making/ A&P                                 Medical Decision Making  This patient presents to the ED with chief complaint(s) of bilateral feet pain with pertinent past medical history of homelessness which further complicates the presenting complaint. The complaint involves an extensive differential diagnosis and also carries with it a high risk of complications and morbidity.    The differential diagnosis includes cellulitis, laceration, ulcer, musculoskeletal pain  Additional history obtained: Records reviewed Care  Everywhere/External Records  ED Course and Reassessment:   Consultation: - Consulted or discussed management/test interpretation w/ external professional: None  Consideration for admission or further workup: Considered for mission further workup however patient's physical exam are reassuring.  Unable to assess patient's full vitals as patient refused to have her vitals taken. Patient given basic foot hygiene and new socks while in ED.  Patient advised to take Tylenol Motrin as needed for pain and follow-up with her primary care if her symptoms persist for further evaluation and treatment.        Final Clinical Impression(s) / ED Diagnoses Final diagnoses:  Foot pain, bilateral    Rx / DC Orders ED Discharge Orders     None         Gretta Began 07/22/23 0818    Arby Barrette, MD 07/22/23 [phone]

## 2023-07-22 NOTE — Discharge Instructions (Signed)
 Today you were seen for bilateral foot pain.  Please try to change your socks daily.  You may also take Tylenol/Motrin as needed for pain.  Please follow-up with your primary care if your symptoms persist for further evaluation and treatment.  Thank you for letting us treat you today. After performing a physical exam, I feel you are safe to go home. Please follow up with your PCP in the next several days and provide them with your records from this visit. Return to the Emergency Room if pain becomes severe or symptoms worsen.

## 2023-07-22 NOTE — ED Notes (Signed)
 Patient states she doesn't want her vitals taken.

## 2023-07-24 ENCOUNTER — Other Ambulatory Visit: Payer: Self-pay

## 2023-07-24 ENCOUNTER — Encounter (HOSPITAL_COMMUNITY): Payer: Self-pay | Admitting: Emergency Medicine

## 2023-07-24 ENCOUNTER — Emergency Department (HOSPITAL_COMMUNITY): Admission: EM | Admit: 2023-07-24 | Discharge: 2023-07-24 | Disposition: A | Discharge status: Home / Self Care

## 2023-07-24 DIAGNOSIS — Z5900 Homelessness unspecified: Secondary | ICD-10-CM | POA: Insufficient documentation

## 2023-07-24 DIAGNOSIS — M79671 Pain in right foot: Secondary | ICD-10-CM | POA: Diagnosis not present

## 2023-07-24 DIAGNOSIS — M79672 Pain in left foot: Secondary | ICD-10-CM | POA: Diagnosis present

## 2023-07-24 LAB — HCG, SERUM, QUALITATIVE: Preg, Serum: NEGATIVE

## 2023-07-24 MED ORDER — ACETAMINOPHEN 325 MG PO TABS
650.0000 mg | ORAL_TABLET | Freq: Once | ORAL | Status: AC
  Administered 2023-07-24: 650 mg via ORAL
  Filled 2023-07-24: qty 2

## 2023-07-24 NOTE — ED Notes (Signed)
 Pt moved from EMS triage room 13 to lobby with assistance of security. Cigarette ashes noted in the floor along with urine after pt was moved.

## 2023-07-24 NOTE — ED Provider Notes (Signed)
 [place] EMERGENCY DEPARTMENT AT [facility] Provider Note   CSN: [id number] Arrival date & time: 07/24/23  0128     History  Chief Complaint  Patient presents with   Pain    Molly [name] is a 38 y.o. female.  38 year old female presents today for concern of bilateral foot pain.  Patient is well-known to this emergency department and has had 28 tests for the past 6 months.  She is homeless.  She has history of dyspareunia.  Reports compliance with her medications.  Typically has delusions regarding her children.  She was just seen yesterday.  She has also been seen for her current complaint multiple times.  She denies any other complaints.  No on exam she is resting comfortably with her eyes closed.  She is requesting to be allowed to sleep in his bed for some additional time.        Home Medications Prior to Admission medications   Medication Sig Start Date End Date Taking? Authorizing Provider  acetaminophen (TYLENOL) 500 MG tablet Take 500-1,000 mg by mouth every 6 (six) hours as needed for mild pain (pain score 1-3) or headache.    [provider]  albuterol (VENTOLIN HFA) 108 (90 Base) MCG/ACT inhaler Inhale 1-2 puffs into the lungs every 4 (four) hours as needed for wheezing or shortness of breath.    [provider]  cephALEXin (KEFLEX) 500 MG capsule Take 1 capsule (500 mg total) by mouth 4 (four) times daily. 04/23/23   Schutt, Edsel Petrin, PA-C  divalproex (DEPAKOTE ER) 500 MG 24 hr tablet Take 1 tablet (500 mg total) by mouth daily. Patient taking differently: Take 1,000 mg by mouth in the morning. 04/02/23   Sabas Sous, MD  fluPHENAZine (PROLIXIN) 10 MG tablet Take 10 mg by mouth daily. 05/25/22   [provider]  fluPHENAZine decanoate (PROLIXIN) 25 MG/ML injection Inject 50 mg into the muscle every 21 ( twenty-one) days.    [provider]  OLANZapine (ZYPREXA) 10 MG tablet Take 1 tablet (10 mg total) by mouth at  bedtime. Patient not taking: Reported on 05/01/2023 04/02/23   Sabas Sous, MD      Allergies    Abilify [aripiprazole]    Review of Systems   Review of Systems  Constitutional:  Negative for fever.  Musculoskeletal:  Positive for arthralgias. Negative for gait problem and joint swelling.  Neurological:  Negative for light-headedness.  All other systems reviewed and are negative.   Physical Exam Updated Vital Signs BP 118/72   Pulse (!) 101   Temp 99 F (37.2 C)   Resp 18   Ht 5\' 8"  (1.727 m)   Wt 119 kg   LMP 06/22/2023   SpO2 99%   BMI 39.89 kg/m  Physical Exam Vitals and nursing note reviewed.  Constitutional:      General: She is not in acute distress.    Appearance: Normal appearance. She is not ill-appearing.  HENT:     Head: Normocephalic and atraumatic.     Nose: Nose normal.  Eyes:     Conjunctiva/sclera: Conjunctivae normal.  Cardiovascular:     Rate and Rhythm: Regular rhythm. Tachycardia present.  Pulmonary:     Effort: Pulmonary effort is normal. No respiratory distress.  Musculoskeletal:        General: No swelling or deformity. Normal range of motion.     Cervical back: Normal range of motion.     Right lower leg: No edema.  Left lower leg: No edema.  Skin:    Findings: No rash.  Neurological:     Mental Status: She is alert.     ED Results / Procedures / Treatments   Labs (all labs ordered are listed, but only abnormal results are displayed) Labs Reviewed  HCG, SERUM, QUALITATIVE    EKG None  Radiology No results found.  Procedures Procedures    Medications Ordered in ED Medications  acetaminophen (TYLENOL) tablet 650 mg (has no administration in time range)    ED Course/ Medical Decision Making/ A&P                                 Medical Decision Making Risk OTC drugs.   38 year old female presents for concern of bilateral foot pain.  Well-known to this emergency department has had 28 visits in the past 6  months.  She was also seen yesterday.  Has had multiple evaluations for similar complaint.  It seems that her main concern is wanting to stay in the emergency department to get some sleep.  No other acute concerns.  Otherwise well-appearing.  Resting with her eyes closed during exam.  Will give dose of Tylenol.  She is otherwise stable for discharge.  Strict return precautions given.   Final Clinical Impression(s) / ED Diagnoses Final diagnoses:  Bilateral foot pain  Homelessness    Rx / DC Orders ED Discharge Orders     None         Marita Kansas, PA-C 07/24/23 0818    Durwin Glaze, MD 07/24/23 [phone]

## 2023-07-24 NOTE — ED Triage Notes (Signed)
 Pt states "I am contracting to have a baby", uncooperative in triage. Pt stares at this RN when asked questions.

## 2023-07-24 NOTE — Discharge Instructions (Signed)
 Your exam was reassuring.  Return for any emergent symptoms.

## 2023-07-24 NOTE — ED Notes (Signed)
 RN and multiple other staff members attempted to get pt to leave. Pt got into wheelchair and peed. RN wheeled pt outside of ER with all belongings.

## 2023-07-26 ENCOUNTER — Emergency Department (HOSPITAL_COMMUNITY)
Admission: EM | Admit: 2023-07-26 | Discharge: 2023-07-27 | Disposition: A | Discharge status: Home / Self Care | Attending: Emergency Medicine | Admitting: Emergency Medicine

## 2023-07-26 ENCOUNTER — Other Ambulatory Visit: Payer: Self-pay

## 2023-07-26 ENCOUNTER — Encounter (HOSPITAL_COMMUNITY): Payer: Self-pay

## 2023-07-26 DIAGNOSIS — R600 Localized edema: Secondary | ICD-10-CM | POA: Insufficient documentation

## 2023-07-26 DIAGNOSIS — M79672 Pain in left foot: Secondary | ICD-10-CM | POA: Diagnosis not present

## 2023-07-26 DIAGNOSIS — R000 Tachycardia, unspecified: Secondary | ICD-10-CM | POA: Diagnosis not present

## 2023-07-26 DIAGNOSIS — Z760 Encounter for issue of repeat prescription: Secondary | ICD-10-CM | POA: Diagnosis not present

## 2023-07-26 DIAGNOSIS — M791 Myalgia, unspecified site: Secondary | ICD-10-CM | POA: Diagnosis not present

## 2023-07-26 DIAGNOSIS — I10 Essential (primary) hypertension: Secondary | ICD-10-CM | POA: Insufficient documentation

## 2023-07-26 DIAGNOSIS — Z5900 Homelessness unspecified: Secondary | ICD-10-CM | POA: Insufficient documentation

## 2023-07-26 DIAGNOSIS — M546 Pain in thoracic spine: Secondary | ICD-10-CM | POA: Insufficient documentation

## 2023-07-26 DIAGNOSIS — E119 Type 2 diabetes mellitus without complications: Secondary | ICD-10-CM | POA: Insufficient documentation

## 2023-07-26 DIAGNOSIS — G8929 Other chronic pain: Secondary | ICD-10-CM | POA: Insufficient documentation

## 2023-07-26 DIAGNOSIS — M25572 Pain in left ankle and joints of left foot: Secondary | ICD-10-CM | POA: Diagnosis not present

## 2023-07-26 DIAGNOSIS — M79671 Pain in right foot: Secondary | ICD-10-CM | POA: Insufficient documentation

## 2023-07-26 DIAGNOSIS — M79601 Pain in right arm: Secondary | ICD-10-CM | POA: Diagnosis present

## 2023-07-26 DIAGNOSIS — W19XXXA Unspecified fall, initial encounter: Secondary | ICD-10-CM | POA: Diagnosis not present

## 2023-07-26 DIAGNOSIS — Z79899 Other long term (current) drug therapy: Secondary | ICD-10-CM | POA: Diagnosis not present

## 2023-07-26 MED ORDER — ACETAMINOPHEN 325 MG PO TABS
650.0000 mg | ORAL_TABLET | Freq: Once | ORAL | Status: AC | PRN
  Administered 2023-07-26: 650 mg via ORAL
  Filled 2023-07-26: qty 2

## 2023-07-26 MED ORDER — LIDOCAINE 5 % EX PTCH
1.0000 | MEDICATED_PATCH | CUTANEOUS | Status: DC
  Administered 2023-07-27: 1 via TRANSDERMAL
  Filled 2023-07-26: qty 1

## 2023-07-26 NOTE — ED Notes (Signed)
 Patient is refusing strep swab at this time.

## 2023-07-26 NOTE — ED Triage Notes (Signed)
 Patient BIB [facility] from [facility] for chronic pain in feet and back. Patient reports that she last used cocaine yesterday and has 8, 9, 10/10 throat pain. Patient smells of urine in triage.

## 2023-07-26 NOTE — ED Provider Notes (Incomplete)
 [place] EMERGENCY DEPARTMENT AT [facility] Provider Note   CSN: [id number] Arrival date & time: 07/26/23  2023     History {Add pertinent medical, surgical, social history, OB history to HPI:1} Chief Complaint  Patient presents with  . Back Pain  . Foot Pain    Molly [name] is a 38 y.o. female with a history of schizophrenia, hypertension, diabetes mellitus, and homelessness who presents the ED today for multiple concerns.  Patient reports bilateral feet pain which she has been seen for multiple times in the past month.  States that this pain has been going on for "a while."  She also reports left-sided back pain that is worse with movement to touch.  Denies any trauma prior to onset of symptoms.  No fevers, abdominal pain, changes to urinary or bowel habits.  Also endorses sore throat with subjective fevers.  Denies any known sick contact.  On evaluation, patient is laying on the cot facing away from me with her eyes closed.  I have to ask her questions multiple times to get answers from her.  She refused strep throat swab in triage.  She is homeless and has a history of malingering in the past.    Home Medications Prior to Admission medications   Medication Sig Start Date End Date Taking? Authorizing Provider  acetaminophen (TYLENOL) 500 MG tablet Take 500-1,000 mg by mouth every 6 (six) hours as needed for mild pain (pain score 1-3) or headache.    [provider]  albuterol (VENTOLIN HFA) 108 (90 Base) MCG/ACT inhaler Inhale 1-2 puffs into the lungs every 4 (four) hours as needed for wheezing or shortness of breath.    [provider]  cephALEXin (KEFLEX) 500 MG capsule Take 1 capsule (500 mg total) by mouth 4 (four) times daily. 04/23/23   Schutt, Edsel Petrin, PA-C  divalproex (DEPAKOTE ER) 500 MG 24 hr tablet Take 1 tablet (500 mg total) by mouth daily. Patient taking differently: Take 1,000 mg by mouth in the morning. 04/02/23   Sabas Sous, MD   fluPHENAZine (PROLIXIN) 10 MG tablet Take 10 mg by mouth daily. 05/25/22   [provider]  fluPHENAZine decanoate (PROLIXIN) 25 MG/ML injection Inject 50 mg into the muscle every 21 ( twenty-one) days.    [provider]  OLANZapine (ZYPREXA) 10 MG tablet Take 1 tablet (10 mg total) by mouth at bedtime. Patient not taking: Reported on 05/01/2023 04/02/23   Sabas Sous, MD      Allergies    Abilify [aripiprazole]    Review of Systems   Review of Systems  Musculoskeletal:  Positive for back pain.  All other systems reviewed and are negative.   Physical Exam Updated Vital Signs BP (!) 144/100 (BP Location: Right Arm)   Pulse (!) 124   Temp 99.1 F (37.3 C) (Oral)   Resp 16   Ht 5\' 8"  (1.727 m)   Wt 119 kg   LMP 06/22/2023   SpO2 100%   BMI 39.89 kg/m  Physical Exam Vitals and nursing note reviewed.  Constitutional:      General: She is not in acute distress.    Appearance: Normal appearance.     Comments: Resting comfortably on exam  HENT:     Head: Normocephalic and atraumatic.     Mouth/Throat:     Mouth: Mucous membranes are moist.  Eyes:     Conjunctiva/sclera: Conjunctivae normal.     Pupils: Pupils are equal, round, and reactive to  light.  Cardiovascular:     Rate and Rhythm: Normal rate and regular rhythm.     Pulses: Normal pulses.  Pulmonary:     Effort: Pulmonary effort is normal.  Abdominal:     Palpations: Abdomen is soft.     Tenderness: There is no abdominal tenderness.  Musculoskeletal:        General: Tenderness present. Normal range of motion.     Cervical back: Normal range of motion.     Comments: Tenderness to the left paravertebral muscles near the thoracic spine with no midline tenderness  Skin:    General: Skin is warm and dry.     Findings: No rash.  Neurological:     General: No focal deficit present.     Mental Status: She is alert.  Psychiatric:        Mood and Affect: Mood normal.        Behavior: Behavior  normal.    ED Results / Procedures / Treatments   Labs (all labs ordered are listed, but only abnormal results are displayed) Labs Reviewed  GROUP A STREP BY PCR    EKG None  Radiology No results found.  Procedures Procedures: not indicated. {Document cardiac monitor, telemetry assessment procedure when appropriate:1}  Medications Ordered in ED Medications  acetaminophen (TYLENOL) tablet 650 mg (650 mg Oral Given 07/26/23 2037)    ED Course/ Medical Decision Making/ A&P   {   Click here for ABCD2, HEART and other calculatorsREFRESH Note before signing :1}                              Medical Decision Making Risk OTC drugs.   This patient presents to the ED for concern of multiple complaints, this involves an extensive number of treatment options, and is a complaint that carries with it a high risk of complications and morbidity.   Differential diagnosis includes: ***   Comorbidities  See HPI above   Additional History  Additional history obtained from prior records   Cardiac Monitoring / EKG  The patient was maintained on a cardiac monitor.  I personally viewed and interpreted the cardiac monitored which showed: *** with a heart rate of *** bpm.   Lab Tests  I ordered and personally interpreted labs.  The pertinent results include:  ***   Imaging Studies  I ordered imaging studies including ***  I independently visualized and interpreted imaging which showed: *** I agree with the radiologist interpretation   Problem List / ED Course / Critical Interventions / Medication Management  *** I ordered medications including: Tylenol for pain  Reevaluation of the patient after these medicines showed that the patient {resolved/improved/worsened:23923::"improved"} I have reviewed the patients home medicines and have made adjustments as needed   Social Determinants of Health  ***   Test / Admission - Considered  ***   {Document critical care  time when appropriate:1} {Document review of labs and clinical decision tools ie heart score, Chads2Vasc2 etc:1}  {Document your independent review of radiology images, and any outside records:1} {Document your discussion with family members, caretakers, and with consultants:1} {Document social determinants of health affecting pt's care:1} {Document your decision making why or why not admission, treatments were needed:1} Final Clinical Impression(s) / ED Diagnoses Final diagnoses:  None    Rx / DC Orders ED Discharge Orders     None

## 2023-07-26 NOTE — ED Provider Notes (Signed)
 [place] EMERGENCY DEPARTMENT AT [facility] Provider Note   CSN: [id number] Arrival date & time: 07/26/23  2023     History  Chief Complaint  Patient presents with   Back Pain   Foot Pain    Molly [name] is a 38 y.o. female with a history of schizophrenia, hypertension, diabetes mellitus, and homelessness who presents the ED today for multiple concerns.  Patient reports bilateral feet pain which she has been seen for multiple times in the past month.  States that this pain has been going on for "a while."  She also reports left-sided back pain that is worse with movement to touch.  Denies any trauma prior to onset of symptoms.  No fevers, abdominal pain, changes to urinary or bowel habits.  Also endorses sore throat with subjective fevers.  Denies any known sick contact. No chest pain or shortness of breath.  On evaluation, patient is laying on the cot facing away from me with her eyes closed.  I have to ask her questions multiple times to get answers from her.  She refused strep throat swab in triage initially.  She is homeless and has a history of malingering in the past.    Home Medications Prior to Admission medications   Medication Sig Start Date End Date Taking? Authorizing Provider  acetaminophen (TYLENOL) 500 MG tablet Take 1 tablet (500 mg total) by mouth every 6 (six) hours as needed for up to 14 days. 07/27/23 08/10/23 Yes Maxwell Marion, PA-C  lidocaine (LIDODERM) 5 % Place 1 patch onto the skin daily for 14 days. Remove & Discard patch within 12 hours or as directed by MD 07/27/23 08/10/23 Yes Maxwell Marion, PA-C  albuterol (VENTOLIN HFA) 108 (90 Base) MCG/ACT inhaler Inhale 1-2 puffs into the lungs every 4 (four) hours as needed for wheezing or shortness of breath.    [provider]  cephALEXin (KEFLEX) 500 MG capsule Take 1 capsule (500 mg total) by mouth 4 (four) times daily. 04/23/23   Schutt, Edsel Petrin, PA-C  divalproex (DEPAKOTE ER) 500 MG 24 hr tablet  Take 1 tablet (500 mg total) by mouth daily. Patient taking differently: Take 1,000 mg by mouth in the morning. 04/02/23   Sabas Sous, MD  fluPHENAZine (PROLIXIN) 10 MG tablet Take 10 mg by mouth daily. 05/25/22   [provider]  fluPHENAZine decanoate (PROLIXIN) 25 MG/ML injection Inject 50 mg into the muscle every 21 ( twenty-one) days.    [provider]  OLANZapine (ZYPREXA) 10 MG tablet Take 1 tablet (10 mg total) by mouth at bedtime. Patient not taking: Reported on 05/01/2023 04/02/23   Sabas Sous, MD      Allergies    Abilify [aripiprazole]    Review of Systems   Review of Systems  Musculoskeletal:  Positive for back pain.  All other systems reviewed and are negative.   Physical Exam Updated Vital Signs BP 123/76 (BP Location: Left Arm)   Pulse (!) 127   Temp 98.7 F (37.1 C) (Oral)   Resp 15   Ht 5\' 8"  (1.727 m)   Wt 119 kg   LMP 06/22/2023   SpO2 99%   BMI 39.89 kg/m  Physical Exam Vitals and nursing note reviewed.  Constitutional:      General: She is not in acute distress.    Appearance: Normal appearance.     Comments: Resting comfortably on exam  HENT:     Head: Normocephalic and atraumatic.  Mouth/Throat:     Mouth: Mucous membranes are moist.  Eyes:     Conjunctiva/sclera: Conjunctivae normal.     Pupils: Pupils are equal, round, and reactive to light.  Cardiovascular:     Rate and Rhythm: Regular rhythm. Tachycardia present.     Pulses: Normal pulses.     Heart sounds: Normal heart sounds.  Pulmonary:     Effort: Pulmonary effort is normal.     Breath sounds: Normal breath sounds.  Abdominal:     Palpations: Abdomen is soft.     Tenderness: There is no abdominal tenderness.  Musculoskeletal:        General: Tenderness present. Normal range of motion.     Cervical back: Normal range of motion.     Right lower leg: Edema present.     Left lower leg: Edema present.     Comments: Tenderness to the left paravertebral  muscles near the thoracic spine with no midline tenderness.  Lower extremity edema present bilaterally without tenderness to touch. Tenderness to touch of the bottom of the feet. Palpable dorsalis pedis pulses.  Skin:    General: Skin is warm and dry.     Findings: No rash.  Neurological:     General: No focal deficit present.     Mental Status: She is alert.  Psychiatric:        Mood and Affect: Mood normal.        Behavior: Behavior normal.    ED Results / Procedures / Treatments   Labs (all labs ordered are listed, but only abnormal results are displayed) Labs Reviewed  BASIC METABOLIC PANEL - Abnormal; Notable for the following components:      Result Value   Potassium 3.3 (*)    Glucose, Bld 133 (*)    Calcium 8.0 (*)    All other components within normal limits  CBC - Abnormal; Notable for the following components:   RBC 3.60 (*)    Hemoglobin 10.1 (*)    HCT 31.0 (*)    RDW 17.9 (*)    Platelets 403 (*)    All other components within normal limits  GROUP A STREP BY PCR  HCG, QUANTITATIVE, PREGNANCY  D-DIMER, QUANTITATIVE (NOT AT [facility])    EKG None  Radiology No results found.  Procedures Procedures: not indicated.   Medications Ordered in ED Medications  lidocaine (LIDODERM) 5 % 1 patch (1 patch Transdermal Patch Applied 07/27/23 0007)  acetaminophen (TYLENOL) tablet 650 mg (650 mg Oral Given 07/26/23 2037)    ED Course/ Medical Decision Making/ A&P Clinical Course as of 07/27/23 0636  Wed Jul 27, 2023  0632 Here for foot pain. Persitently tachy. Scanning for PE. Did cocaine yesterday. EKG is pending. IF CT neg, can discharge [JR]    Clinical Course User Index [JR] Gareth Eagle, PA-C                                 Medical Decision Making Amount and/or Complexity of Data Reviewed Labs: ordered. Radiology: ordered.  Risk OTC drugs. Prescription drug management.   This patient presents to the ED for concern of multiple complaints, this  involves an extensive number of treatment options, and is a complaint that carries with it a high risk of complications and morbidity.   Differential diagnosis includes: malingering, chronic pain, substance abuse disorder, PE, dehydration, etc.   Comorbidities  See HPI above   Additional History  Additional history obtained from prior records   Lab Tests  I ordered and personally interpreted labs.  The pertinent results include:   Negative strep swab BMP and CBC within normal limits for patient Negative pregnancy test D-dimer hemolyzed. New d-dimer pending at shift change.   Imaging Studies  I ordered imaging studies including CTA PE study Patient was brought to CT but the line blew and patient started yelling so they brought her back. I independently visualized and interpreted imaging which showed:  Pending at shift change.   Problem List / ED Course / Critical Interventions / Medication Management  Patient reports chronic bilateral pain to the bottom of her feet and left-sided back pain that is worse to touch and with movement. I ordered medications including: Tylenol and Lidocaine patch for pain  Reevaluation of the patient after these medicines showed that the patient improved. Patient's heart rate in the ED is 127 bpm.  She has a history of tachycardia in the past, seen in previous ED charts.  Her heart rate yesterday was 102 bpm but it is significantly higher today.  She denies chest pain or shortness of breath.  Her pain is chronic so I doubt that that is the cause of her heart rate being so elevated.  She does endorse recent cocaine use yesterday. Patient refused CT.  I went and talked with her that she said she was agreeable with it.  She was taken to CT and IV access was blown so patient was brought back. Patient was found smoking a cigarette in the hallway and cigarettes were confiscated by security.  The nurse advised me shortly after that she took a red pill that  she had with her which she stated was "Tylenol."   Social Determinants of Health  Homelessness   Test / Admission - Considered  Disposition pending at shift change.        Final Clinical Impression(s) / ED Diagnoses Final diagnoses:  Chronic pain of both feet    Rx / DC Orders ED Discharge Orders          Ordered    acetaminophen (TYLENOL) 500 MG tablet  Every 6 hours PRN        07/27/23 0204    lidocaine (LIDODERM) 5 %  Every 24 hours        07/27/23 0204              Maxwell Marion, PA-C 07/27/23 0636    Dione Booze, MD 07/27/23 [phone]

## 2023-07-27 ENCOUNTER — Emergency Department (HOSPITAL_COMMUNITY)
Admission: EM | Admit: 2023-07-27 | Discharge: 2023-07-28 | Disposition: A | Discharge status: Home / Self Care | Source: Home / Self Care | Attending: Emergency Medicine | Admitting: Emergency Medicine

## 2023-07-27 ENCOUNTER — Emergency Department (HOSPITAL_COMMUNITY)

## 2023-07-27 ENCOUNTER — Encounter (HOSPITAL_COMMUNITY): Payer: Self-pay | Admitting: Emergency Medicine

## 2023-07-27 ENCOUNTER — Other Ambulatory Visit: Payer: Self-pay

## 2023-07-27 DIAGNOSIS — M79601 Pain in right arm: Secondary | ICD-10-CM | POA: Insufficient documentation

## 2023-07-27 DIAGNOSIS — W19XXXA Unspecified fall, initial encounter: Secondary | ICD-10-CM | POA: Insufficient documentation

## 2023-07-27 DIAGNOSIS — R000 Tachycardia, unspecified: Secondary | ICD-10-CM | POA: Insufficient documentation

## 2023-07-27 DIAGNOSIS — Z760 Encounter for issue of repeat prescription: Secondary | ICD-10-CM | POA: Insufficient documentation

## 2023-07-27 DIAGNOSIS — M79671 Pain in right foot: Secondary | ICD-10-CM | POA: Diagnosis not present

## 2023-07-27 DIAGNOSIS — E119 Type 2 diabetes mellitus without complications: Secondary | ICD-10-CM | POA: Insufficient documentation

## 2023-07-27 DIAGNOSIS — M791 Myalgia, unspecified site: Secondary | ICD-10-CM | POA: Insufficient documentation

## 2023-07-27 LAB — BASIC METABOLIC PANEL
Anion gap: 8 (ref 5–15)
BUN: 6 mg/dL (ref 6–20)
CO2: 25 mmol/L (ref 22–32)
Calcium: 8 mg/dL — ABNORMAL LOW (ref 8.9–10.3)
Chloride: 102 mmol/L (ref 98–111)
Creatinine, Ser: 0.64 mg/dL (ref 0.44–1.00)
GFR, Estimated: >60 mL/min (ref >60)
Glucose, Bld: 133 mg/dL — ABNORMAL HIGH (ref 70–99)
Potassium: 3.3 mmol/L — ABNORMAL LOW (ref 3.5–5.1)
Sodium: 135 mmol/L (ref 135–145)

## 2023-07-27 LAB — RESP PANEL BY RT-PCR (RSV, FLU A&B, COVID)  RVPGX2
Influenza A by PCR: NEGATIVE
Influenza B by PCR: NEGATIVE
Resp Syncytial Virus by PCR: NEGATIVE
SARS Coronavirus 2 by RT PCR: NEGATIVE

## 2023-07-27 LAB — CBC
HCT: 31 % — ABNORMAL LOW (ref 36.0–46.0)
Hemoglobin: 10.1 g/dL — ABNORMAL LOW (ref 12.0–15.0)
MCH: 28.1 pg (ref 26.0–34.0)
MCHC: 32.6 g/dL (ref 30.0–36.0)
MCV: 86.1 fL (ref 80.0–100.0)
Platelets: 403 10*3/uL — ABNORMAL HIGH (ref 150–400)
RBC: 3.6 MIL/uL — ABNORMAL LOW (ref 3.87–5.11)
RDW: 17.9 % — ABNORMAL HIGH (ref 11.5–15.5)
WBC: 10.3 10*3/uL (ref 4.0–10.5)
nRBC: 0.2 % (ref 0.0–0.2)

## 2023-07-27 LAB — GROUP A STREP BY PCR: Group A Strep by PCR: NOT DETECTED

## 2023-07-27 LAB — HCG, QUANTITATIVE, PREGNANCY: hCG, Beta Chain, Quant, S: <1 m[IU]/mL (ref <5)

## 2023-07-27 MED ORDER — ACETAMINOPHEN 325 MG PO TABS
650.0000 mg | ORAL_TABLET | Freq: Once | ORAL | Status: AC
  Administered 2023-07-27: 650 mg via ORAL
  Filled 2023-07-27: qty 2

## 2023-07-27 MED ORDER — LIDOCAINE 5 % EX PTCH: 1.0000 | MEDICATED_PATCH | CUTANEOUS | 0 refills | Status: AC

## 2023-07-27 MED ORDER — IBUPROFEN 800 MG PO TABS
800.0000 mg | ORAL_TABLET | Freq: Once | ORAL | Status: DC
  Filled 2023-07-27: qty 1

## 2023-07-27 MED ORDER — ACETAMINOPHEN 500 MG PO TABS: 500.0000 mg | ORAL_TABLET | Freq: Four times a day (QID) | ORAL | 0 refills | Status: AC | PRN

## 2023-07-27 NOTE — ED Triage Notes (Addendum)
 Pt in via [employer] after 911 called for pt sleeping in a hotel lobby, pt reporting "generalized body pain". Pt states, "I've been out of all my meds x 10 days". Was seen yesterday

## 2023-07-27 NOTE — ED Provider Notes (Cosign Needed Addendum)
 Accepted handoff at shift change from  [facility], PA-C. Please see prior provider note for more detail.   Briefly: Patient is 38 y.o. presenting for foot pain but fount to be persistently tachycardic. Denies CP and SOB.  DDX: concern for PE vs other etiology of tachycardia  Plan:  f/u on CT angio chest  Physical Exam  BP 123/76 (BP Location: Left Arm)   Pulse (!) 106   Temp 98.3 F (36.8 C) (Oral)   Resp 18   Ht 5\' 8"  (1.727 m)   Wt 119 kg   LMP 06/22/2023   SpO2 100%   BMI 39.89 kg/m   Physical Exam  Procedures  Procedures  ED Course / MDM   Clinical Course as of 07/27/23 0833  Wed Jul 27, 2023  0632 Here for foot pain. Persitently tachy. Scanning for PE. Did cocaine yesterday. EKG is pending. IF CT neg, can discharge [JR]    Clinical Course User Index [JR] Gareth Eagle, PA-C   Medical Decision Making Amount and/or Complexity of Data Reviewed Labs: ordered.  Risk OTC drugs. Prescription drug management.   On reassessment, patient tachycardia had improved considerably without intervention. Still denies chest pain and shob. Also patient and OCP use. Tachycardia explained by recent cocaine use and possible dehydration. Orally rehydrated. Advised to f/u with PCP. Advised conservative measures for her foot pain. Discussed return precautions. Discharged.        Gareth Eagle, PA-C 07/27/23 0838    Gareth Eagle, PA-C 07/27/23 7829    Linwood Dibbles, MD 07/28/23 [phone]

## 2023-07-27 NOTE — ED Notes (Signed)
 Pt smoking in the hallway. EDP aware. Security confiscated cigarettes and lighter.

## 2023-07-27 NOTE — ED Notes (Signed)
Pt refused to go to CT. 

## 2023-07-27 NOTE — Discharge Instructions (Addendum)
 You are negative for strep throat. Please follow up with your PCP for your foot pain. Recommend applying ice 3 to 4 times per day, tylenol and ibuprofen. IF you develop chest pain or shortness of breath please return to the ED.

## 2023-07-27 NOTE — ED Provider Triage Note (Signed)
 Emergency Medicine Provider Triage Evaluation Note  Molly [name] , a 38 y.o. female  was evaluated in triage.  Pt complains of body aches.  Patient endorses pain all throughout her body which started today.  Reports that she fell in the hotel room today and landed on her left side.  States she did bump her head but did not lose consciousness.  She does not take blood thinners.  She took Tylenol for symptoms prior to arrival with improvement.  Review of Systems  Positive: Body aches Negative: Fevers, LOC, weakness  Physical Exam  BP (!) 139/97 (BP Location: Right Arm)   Pulse (!) 118   Temp 99.2 F (37.3 C) (Oral)   Resp 18   Wt 119 kg   LMP 06/22/2023 (Approximate)   SpO2 100%   BMI 39.89 kg/m  Gen:   Awake, no distress   Resp:  Normal effort  MSK:   Moves extremities without difficulty  Other:    Medical Decision Making  Medically screening exam initiated at 10:14 PM.  Appropriate orders placed.  Molly [name] was informed that the remainder of the evaluation will be completed by another provider, this initial triage assessment does not replace that evaluation, and the importance of remaining in the ED until their evaluation is complete.  Patient was seen in the ED yesterday for similar concerns.    Maxwell Marion, PA-C 07/27/23 2216

## 2023-07-27 NOTE — ED Notes (Signed)
 Pt refused discharge vitals and Motrin.pt verbalized understanding of discharge instructions. Pt legal gardian called and informed of pt discharge.  Pt refusing to leave. Security called to bed to escort pt out of ed.

## 2023-07-27 NOTE — ED Notes (Signed)
 Pt refused to allow rn to take her blood pressure. Pt did accept oral hydration.

## 2023-07-28 ENCOUNTER — Emergency Department (HOSPITAL_COMMUNITY)
Admission: EM | Admit: 2023-07-28 | Discharge: 2023-07-28 | Disposition: A | Discharge status: Home / Self Care | Attending: Emergency Medicine | Admitting: Emergency Medicine

## 2023-07-28 ENCOUNTER — Emergency Department (HOSPITAL_COMMUNITY)

## 2023-07-28 ENCOUNTER — Other Ambulatory Visit: Payer: Self-pay

## 2023-07-28 ENCOUNTER — Encounter (HOSPITAL_COMMUNITY): Payer: Self-pay | Admitting: Emergency Medicine

## 2023-07-28 DIAGNOSIS — M79671 Pain in right foot: Secondary | ICD-10-CM | POA: Diagnosis not present

## 2023-07-28 DIAGNOSIS — Z79899 Other long term (current) drug therapy: Secondary | ICD-10-CM | POA: Diagnosis not present

## 2023-07-28 DIAGNOSIS — M25572 Pain in left ankle and joints of left foot: Secondary | ICD-10-CM | POA: Insufficient documentation

## 2023-07-28 MED ORDER — IBUPROFEN 400 MG PO TABS
600.0000 mg | ORAL_TABLET | Freq: Once | ORAL | Status: AC
  Administered 2023-07-28: 600 mg via ORAL
  Filled 2023-07-28: qty 1

## 2023-07-28 MED ORDER — FLUPHENAZINE HCL 5 MG PO TABS
10.0000 mg | ORAL_TABLET | Freq: Once | ORAL | Status: AC
  Administered 2023-07-28: 10 mg via ORAL
  Filled 2023-07-28 (×2): qty 2

## 2023-07-28 MED ORDER — DIVALPROEX SODIUM 250 MG PO DR TAB
500.0000 mg | DELAYED_RELEASE_TABLET | Freq: Once | ORAL | Status: AC
  Administered 2023-07-28: 500 mg via ORAL
  Filled 2023-07-28: qty 2

## 2023-07-28 MED ORDER — ACETAMINOPHEN 500 MG PO TABS
1000.0000 mg | ORAL_TABLET | Freq: Once | ORAL | Status: AC
  Administered 2023-07-28: 1000 mg via ORAL
  Filled 2023-07-28: qty 2

## 2023-07-28 MED ORDER — ALBUTEROL SULFATE HFA 108 (90 BASE) MCG/ACT IN AERS
1.0000 | INHALATION_SPRAY | Freq: Once | RESPIRATORY_TRACT | Status: AC
  Administered 2023-07-28: 1 via RESPIRATORY_TRACT
  Filled 2023-07-28: qty 6.7

## 2023-07-28 NOTE — Discharge Instructions (Addendum)
 Your x-rays were reassuring this evening.  You are provided with a sling for comfort.  If your arm pain does not improve you should schedule a follow-up appointment with an orthopedic provider.  You may take Tylenol or ibuprofen for pain at home.  Please resume your schizophrenia medications.  Follow-up with your behavioral health team for refills as needed.  If you develop any life-threatening symptoms return to the emergency department.

## 2023-07-28 NOTE — ED Provider Notes (Signed)
 [place] EMERGENCY DEPARTMENT AT [facility] Provider Note   CSN: [id number] Arrival date & time: 07/27/23  2130     History  No chief complaint on file.   Molly [name] is a 38 y.o. female.  Patient with past medical history significant for schizophrenia, type II DM presents emergency department complaining of generalized bodyaches, right arm pain secondary to a mechanical fall, and requesting medication refill.  Patient states she has been out of her psychiatric medications and albuterol for approximately 10 days.  Patient well-known to this department with 30 visits over the past 6 months and 6 visits since March 19.  HPI     Home Medications Prior to Admission medications   Medication Sig Start Date End Date Taking? Authorizing Provider  acetaminophen (TYLENOL) 500 MG tablet Take 1 tablet (500 mg total) by mouth every 6 (six) hours as needed for up to 14 days. 07/27/23 08/10/23  Maxwell Marion, PA-C  albuterol (VENTOLIN HFA) 108 (90 Base) MCG/ACT inhaler Inhale 1-2 puffs into the lungs every 4 (four) hours as needed for wheezing or shortness of breath.    [provider]  cephALEXin (KEFLEX) 500 MG capsule Take 1 capsule (500 mg total) by mouth 4 (four) times daily. 04/23/23   Schutt, Edsel Petrin, PA-C  divalproex (DEPAKOTE ER) 500 MG 24 hr tablet Take 1 tablet (500 mg total) by mouth daily. Patient taking differently: Take 1,000 mg by mouth in the morning. 04/02/23   Sabas Sous, MD  fluPHENAZine (PROLIXIN) 10 MG tablet Take 10 mg by mouth daily. 05/25/22   [provider]  fluPHENAZine decanoate (PROLIXIN) 25 MG/ML injection Inject 50 mg into the muscle every 21 ( twenty-one) days.    [provider]  lidocaine (LIDODERM) 5 % Place 1 patch onto the skin daily for 14 days. Remove & Discard patch within 12 hours or as directed by MD 07/27/23 08/10/23  Maxwell Marion, PA-C  OLANZapine (ZYPREXA) 10 MG tablet Take 1 tablet (10 mg total) by mouth at  bedtime. Patient not taking: Reported on 05/01/2023 04/02/23   Sabas Sous, MD      Allergies    Abilify [aripiprazole]    Review of Systems   Review of Systems  Physical Exam Updated Vital Signs BP (!) 139/97 (BP Location: Right Arm)   Pulse (!) 118   Temp 99.2 F (37.3 C) (Oral)   Resp 18   Wt 119 kg   LMP 06/22/2023 (Approximate)   SpO2 100%   BMI 39.89 kg/m  Physical Exam Vitals and nursing note reviewed.  HENT:     Head: Normocephalic and atraumatic.  Eyes:     Pupils: Pupils are equal, round, and reactive to light.  Cardiovascular:     Rate and Rhythm: Tachycardia present.  Pulmonary:     Effort: Pulmonary effort is normal. No respiratory distress.  Musculoskeletal:        General: No tenderness or signs of injury. Normal range of motion.     Cervical back: Normal range of motion.     Comments: Grossly normal range of motion of the right upper arm with sensation intact distally.  No tenderness to palpation.  Palpable radial pulse.  Skin:    General: Skin is dry.  Neurological:     Mental Status: She is alert.  Psychiatric:        Speech: Speech normal.        Behavior: Behavior normal.     ED Results / Procedures /  Treatments   Labs (all labs ordered are listed, but only abnormal results are displayed) Labs Reviewed  RESP PANEL BY RT-PCR (RSV, FLU A&B, COVID)  RVPGX2    EKG None  Radiology DG Humerus Right Result Date: 07/28/2023 CLINICAL DATA:  Right arm pain after a fall. EXAM: RIGHT HUMERUS - 2+ VIEW COMPARISON:  Right shoulder 12/27/2022 FINDINGS: Degenerative changes are demonstrated in the right shoulder and right elbow. No evidence of acute fracture or dislocation. No focal bone lesion or bone destruction. Bone cortex appears intact. Soft tissues are unremarkable. IMPRESSION: Degenerative changes in the right shoulder and elbow. No acute bony abnormalities. Electronically Signed   By: Burman Nieves M.D.   On: 07/28/2023 01:08     Procedures Procedures    Medications Ordered in ED Medications  acetaminophen (TYLENOL) tablet 650 mg (650 mg Oral Given 07/27/23 2227)  ibuprofen (ADVIL) tablet 600 mg (600 mg Oral Given 07/28/23 0119)  fluPHENAZine (PROLIXIN) tablet 10 mg (10 mg Oral Given 07/28/23 0119)  divalproex (DEPAKOTE) DR tablet 500 mg (500 mg Oral Given 07/28/23 0119)  albuterol (VENTOLIN HFA) 108 (90 Base) MCG/ACT inhaler 1 puff (1 puff Inhalation Given 07/28/23 0119)    ED Course/ Medical Decision Making/ A&P                                 Medical Decision Making Amount and/or Complexity of Data Reviewed Radiology: ordered.  Risk Prescription drug management.   This patient presents to the ED for concern of arm pain, this involves an extensive number of treatment options, and is a complaint that carries with it a high risk of complications and morbidity.  The differential diagnosis includes fracture, dislocation, soft tissue injury, others   Co morbidities that complicate the patient evaluation  Schizophrenia   Additional history obtained:  Additional history obtained from EMS External records from outside source obtained and reviewed including behavioral health notes   Lab Tests:  I Ordered, and personally interpreted labs.  The pertinent results include: Negative respiratory panel   Imaging Studies ordered:  I ordered imaging studies including plain films of the right humerus I independently visualized and interpreted imaging which showed no fracture or dislocation I agree with the radiologist interpretation   Problem List / ED Course / Critical interventions / Medication management   I ordered medication including Tylenol and ibuprofen for body pains, Prolixin, Depakote for schizophrenia, albuterol for missed medication Reevaluation of the patient after these medicines showed that the patient improved I have reviewed the patients home medicines and have made adjustments as  needed   Social Determinants of Health:  Patient with history of homelessness   Test / Admission - Considered:  Patient with negative respiratory panel.  Patient is afebrile.  Patient no longer complaining of generalized body aches.  Patient complaining of right arm pain with no acute findings on imaging.  She is requesting a sling which was provided.  Patient provided with her Prolixin and Depakote along with albuterol due to missed medications.  No indication for further emergent workup.  Patient stable for discharge at this time.  Return precautions provided.         Final Clinical Impression(s) / ED Diagnoses Final diagnoses:  Right arm pain    Rx / DC Orders ED Discharge Orders     None         Pamala Duffel 07/28/23 0131    Madilyn Hook,  Lanora Manis, MD 07/28/23 [phone]

## 2023-07-28 NOTE — ED Notes (Signed)
 Patient transported to X-ray

## 2023-07-28 NOTE — ED Provider Notes (Signed)
 [place] EMERGENCY DEPARTMENT AT [facility] Provider Note   CSN: [id number] Arrival date & time: 07/28/23  9562     History  Chief Complaint  Patient presents with   Ankle Pain    Molly [name] is a 38 y.o. female.  States her left ankle hurts after tripping on a curb a couple times. It's swollen similar to right. No other injuries. Subsequently asks if she can get an AIDS test as well.    Ankle Pain      Home Medications Prior to Admission medications   Medication Sig Start Date End Date Taking? Authorizing Provider  acetaminophen (TYLENOL) 500 MG tablet Take 1 tablet (500 mg total) by mouth every 6 (six) hours as needed for up to 14 days. 07/27/23 08/10/23  Maxwell Marion, PA-C  albuterol (VENTOLIN HFA) 108 (90 Base) MCG/ACT inhaler Inhale 1-2 puffs into the lungs every 4 (four) hours as needed for wheezing or shortness of breath.    [provider]  cephALEXin (KEFLEX) 500 MG capsule Take 1 capsule (500 mg total) by mouth 4 (four) times daily. 04/23/23   Schutt, Edsel Petrin, PA-C  divalproex (DEPAKOTE ER) 500 MG 24 hr tablet Take 1 tablet (500 mg total) by mouth daily. Patient taking differently: Take 1,000 mg by mouth in the morning. 04/02/23   Sabas Sous, MD  fluPHENAZine (PROLIXIN) 10 MG tablet Take 10 mg by mouth daily. 05/25/22   [provider]  fluPHENAZine decanoate (PROLIXIN) 25 MG/ML injection Inject 50 mg into the muscle every 21 ( twenty-one) days.    [provider]  lidocaine (LIDODERM) 5 % Place 1 patch onto the skin daily for 14 days. Remove & Discard patch within 12 hours or as directed by MD 07/27/23 08/10/23  Maxwell Marion, PA-C  OLANZapine (ZYPREXA) 10 MG tablet Take 1 tablet (10 mg total) by mouth at bedtime. Patient not taking: Reported on 05/01/2023 04/02/23   Sabas Sous, MD      Allergies    Abilify [aripiprazole]    Review of Systems   Review of Systems  Physical Exam Updated Vital Signs BP (!)  146/104   Pulse (!) 104   Temp 98.4 F (36.9 C)   Resp 18   Ht 5\' 8"  (1.727 m)   Wt 119 kg   LMP 06/22/2023 (Approximate)   SpO2 100%   BMI 39.89 kg/m  Physical Exam Vitals and nursing note reviewed.  Constitutional:      Appearance: She is well-developed.  HENT:     Head: Normocephalic and atraumatic.  Cardiovascular:     Rate and Rhythm: Normal rate and regular rhythm.  Pulmonary:     Effort: No respiratory distress.     Breath sounds: No stridor.  Abdominal:     General: There is no distension.  Musculoskeletal:        General: Tenderness (left medial malleolus, mild edema but similar to right) present.     Cervical back: Normal range of motion.  Neurological:     Mental Status: She is alert.     ED Results / Procedures / Treatments   Labs (all labs ordered are listed, but only abnormal results are displayed) Labs Reviewed - No data to display  EKG None  Radiology DG Humerus Right Result Date: 07/28/2023 CLINICAL DATA:  Right arm pain after a fall. EXAM: RIGHT HUMERUS - 2+ VIEW COMPARISON:  Right shoulder 12/27/2022 FINDINGS: Degenerative changes are demonstrated in the right shoulder and right elbow. No evidence  of acute fracture or dislocation. No focal bone lesion or bone destruction. Bone cortex appears intact. Soft tissues are unremarkable. IMPRESSION: Degenerative changes in the right shoulder and elbow. No acute bony abnormalities. Electronically Signed   By: Burman Nieves M.D.   On: 07/28/2023 01:08    Procedures Procedures    Medications Ordered in ED Medications  acetaminophen (TYLENOL) tablet 1,000 mg (1,000 mg Oral Given 07/28/23 4098)    ED Course/ Medical Decision Making/ A&P                                 Medical Decision Making Amount and/or Complexity of Data Reviewed Radiology: ordered.  Risk OTC drugs.  Suspect malingering as she is here quite often and it seems that this ankle pain started shortly after being discharged from  here earlier tonight but as she reports trauma, reports limp and has medial mall ttp, will get xr. No indication for an AIDS test at this time.  XR viewed and interpreted by myself without obvious fracture, deformity, malalignment or effusion. Stable for d/c.   Final Clinical Impression(s) / ED Diagnoses Final diagnoses:  Acute left ankle pain    Rx / DC Orders ED Discharge Orders     None         Keandra Medero, Barbara Cower, MD 07/28/23 [phone]

## 2023-07-28 NOTE — ED Triage Notes (Signed)
 Patient complaining of left ankle pain that started hurting just now. Patient recently discharged earlier tonight. Denies injury. Ambulatory to triage.

## 2023-08-01 ENCOUNTER — Emergency Department (HOSPITAL_COMMUNITY)
Admission: EM | Admit: 2023-08-01 | Discharge: 2023-08-02 | Disposition: A | Discharge status: Home / Self Care | Attending: Emergency Medicine | Admitting: Emergency Medicine

## 2023-08-01 DIAGNOSIS — R21 Rash and other nonspecific skin eruption: Secondary | ICD-10-CM | POA: Insufficient documentation

## 2023-08-01 NOTE — ED Triage Notes (Signed)
 Pt here for vaginal pain with vaginal rash. Also reports pain on the tops of her feet. Pt refusing BP in triage, took BP cuff off of arm.

## 2023-08-02 ENCOUNTER — Encounter (HOSPITAL_COMMUNITY): Payer: Self-pay | Admitting: Emergency Medicine

## 2023-08-02 ENCOUNTER — Other Ambulatory Visit: Payer: Self-pay

## 2023-08-02 DIAGNOSIS — R21 Rash and other nonspecific skin eruption: Secondary | ICD-10-CM | POA: Diagnosis not present

## 2023-08-02 MED ORDER — ACETAMINOPHEN 500 MG PO TABS
1000.0000 mg | ORAL_TABLET | Freq: Once | ORAL | Status: AC
  Administered 2023-08-02: 1000 mg via ORAL
  Filled 2023-08-02: qty 2

## 2023-08-02 MED ORDER — FLUCONAZOLE 150 MG PO TABS
150.0000 mg | ORAL_TABLET | Freq: Once | ORAL | Status: AC
  Administered 2023-08-02: 150 mg via ORAL
  Filled 2023-08-02: qty 1

## 2023-08-02 NOTE — ED Provider Notes (Signed)
 [employer] EMERGENCY DEPARTMENT AT [facility] Provider Note   CSN: [id number] Arrival date & time: 08/01/23  2359     History  Chief Complaint  Patient presents with   Rash    Molly [name] is a 38 y.o. female.  The history is provided by the patient and medical records.   38 y.o. F with history of aggressive behavior, borderline intellectual disability, diabetes, eczema, schizoaffective disorder, presenting to the ED for rash.  Patient reports rash has been there for about 1 week.  States she thought it was just a heat rash so she used some topical facial cream and feminine vaginal cream but has seemed to get worse.  States rash is very itchy.  Home Medications Prior to Admission medications   Medication Sig Start Date End Date Taking? Authorizing Provider  acetaminophen (TYLENOL) 500 MG tablet Take 1 tablet (500 mg total) by mouth every 6 (six) hours as needed for up to 14 days. 07/27/23 08/10/23  Maxwell Marion, PA-C  albuterol (VENTOLIN HFA) 108 (90 Base) MCG/ACT inhaler Inhale 1-2 puffs into the lungs every 4 (four) hours as needed for wheezing or shortness of breath.    [provider]  cephALEXin (KEFLEX) 500 MG capsule Take 1 capsule (500 mg total) by mouth 4 (four) times daily. 04/23/23   Schutt, Edsel Petrin, PA-C  divalproex (DEPAKOTE ER) 500 MG 24 hr tablet Take 1 tablet (500 mg total) by mouth daily. Patient taking differently: Take 1,000 mg by mouth in the morning. 04/02/23   Sabas Sous, MD  fluPHENAZine (PROLIXIN) 10 MG tablet Take 10 mg by mouth daily. 05/25/22   [provider]  fluPHENAZine decanoate (PROLIXIN) 25 MG/ML injection Inject 50 mg into the muscle every 21 ( twenty-one) days.    [provider]  lidocaine (LIDODERM) 5 % Place 1 patch onto the skin daily for 14 days. Remove & Discard patch within 12 hours or as directed by MD 07/27/23 08/10/23  Maxwell Marion, PA-C  OLANZapine (ZYPREXA) 10 MG tablet Take 1 tablet (10 mg  total) by mouth at bedtime. Patient not taking: Reported on 05/01/2023 04/02/23   Sabas Sous, MD      Allergies    Abilify [aripiprazole]    Review of Systems   Review of Systems  Skin:  Positive for rash.  All other systems reviewed and are negative.   Physical Exam Updated Vital Signs Pulse (!) 130   Temp 99.8 F (37.7 C) (Oral)   Resp 15   Ht 5\' 8"  (1.727 m)   Wt 119 kg   LMP 06/22/2023 (Approximate)   SpO2 100%   BMI 39.89 kg/m   Physical Exam Vitals and nursing note reviewed.  Constitutional:      Appearance: She is well-developed.  HENT:     Head: Normocephalic and atraumatic.  Eyes:     Conjunctiva/sclera: Conjunctivae normal.     Pupils: Pupils are equal, round, and reactive to light.  Cardiovascular:     Rate and Rhythm: Normal rate and regular rhythm.     Heart sounds: Normal heart sounds.  Pulmonary:     Effort: Pulmonary effort is normal.     Breath sounds: Normal breath sounds.  Genitourinary:    Comments: Scratching vaginal area, seems to have some fungal appearing rash noted in the groin folds, some clumpy appearing discharge noted at the introitus (?discharge vs cream she applied); some chronic appearing scarred areas noted to the labia without acute lesion/ulceration Musculoskeletal:  General: Normal range of motion.     Cervical back: Normal range of motion.  Skin:    General: Skin is warm and dry.  Neurological:     Mental Status: She is alert and oriented to person, place, and time.     ED Results / Procedures / Treatments   Labs (all labs ordered are listed, but only abnormal results are displayed) Labs Reviewed - No data to display  EKG None  Radiology No results found.  Procedures Procedures    Medications Ordered in ED Medications  fluconazole (DIFLUCAN) tablet 150 mg (150 mg Oral Given 08/02/23 0453)    ED Course/ Medical Decision Making/ A&P                                 Medical Decision  Making Risk Prescription drug management.   38 year old female presenting to the ED with vaginal rash.  States it feels itchy.  Made worse after using face cream on the area.  She is afebrile and nontoxic.  She is somewhat disorganized on exam but this is baseline for her.  She does have a little bit of fungal appearing rash in the groin folds and some white, thick vaginal discharge noted at the vaginal introitus.  I question if this is discharge versus topical cream that she had applied previously.  Also has some scarred areas to the labia without acute lesion or open sore.  She is not having bleeding.  Given dose of Diflucan here.  Asked her to keep area clean with soap and warm water, try to keep dry.  Avoid using baby powder, cornstarch, etc.  She was given follow-up at [facility] clinic.  Can return here for new concerns.  Final Clinical Impression(s) / ED Diagnoses Final diagnoses:  Rash    Rx / DC Orders ED Discharge Orders     None         Garlon Hatchet, PA-C 08/02/23 0519    Dione Booze, MD 08/02/23 [phone]

## 2023-08-02 NOTE — Discharge Instructions (Signed)
 Try to keep area clean/dry with just soap and warm water. Avoid using creams to the vaginal area. Can follow-up at [facility] clinic. Return here for new concerns.

## 2023-08-02 NOTE — ED Notes (Signed)
 Went to check on pt as she had received her discharge instructions at 46. Pt still laying in bed. Informed pt she had been discharged and needed to get dressed. Pt getting up at this time, will continue to monitor.

## 2023-08-15 ENCOUNTER — Other Ambulatory Visit: Payer: Self-pay

## 2023-08-15 ENCOUNTER — Encounter (HOSPITAL_COMMUNITY): Payer: Self-pay | Admitting: Emergency Medicine

## 2023-08-15 ENCOUNTER — Emergency Department (HOSPITAL_COMMUNITY)
Admission: EM | Admit: 2023-08-15 | Discharge: 2023-08-15 | Disposition: A | Discharge status: Home / Self Care | Attending: Emergency Medicine | Admitting: Emergency Medicine

## 2023-08-15 DIAGNOSIS — Z5329 Procedure and treatment not carried out because of patient's decision for other reasons: Secondary | ICD-10-CM | POA: Diagnosis not present

## 2023-08-15 DIAGNOSIS — R21 Rash and other nonspecific skin eruption: Secondary | ICD-10-CM | POA: Diagnosis present

## 2023-08-15 LAB — HIV ANTIBODY (ROUTINE TESTING W REFLEX): HIV Screen 4th Generation wRfx: NONREACTIVE

## 2023-08-15 LAB — RPR
RPR Ser Ql: REACTIVE — AB
RPR Titer: 1:128 {titer}

## 2023-08-15 MED ORDER — FLUCONAZOLE 150 MG PO TABS
150.0000 mg | ORAL_TABLET | Freq: Once | ORAL | Status: AC
  Administered 2023-08-15: 150 mg via ORAL
  Filled 2023-08-15: qty 1

## 2023-08-15 MED ORDER — PENICILLIN G BENZATHINE 1200000 UNIT/2ML IM SUSY
2.4000 10*6.[IU] | PREFILLED_SYRINGE | Freq: Once | INTRAMUSCULAR | Status: DC
  Filled 2023-08-15: qty 4

## 2023-08-15 NOTE — Discharge Instructions (Addendum)
 Molly [name]

## 2023-08-15 NOTE — ED Notes (Signed)
 Attempted to contact patient's legal guardian, Molly [name] at [phone], the first time. Attempt was unsuccessful.

## 2023-08-15 NOTE — ED Triage Notes (Signed)
 Patient c/o itchy rash under arms, to groin area, on feet, and on chest x 3 weeks.  Patient has been trying topical ointment with no relief.

## 2023-08-15 NOTE — ED Provider Notes (Signed)
 [facility] Emergency Department Provider Note MRN:  [id number]  Arrival date & time: 08/15/23     Chief Complaint   Rash   History of Present Illness   Molly [name] is a 38 y.o. year-old female presents to the ED with chief complaint of rash.  She complains of rash on her vagina, torso, chest, and feet.  She states it is painful.  She has the rash/lesions on her soles of her feet and on her palms.  She denies fever.  History provided by patient.   Review of Systems  Pertinent positive and negative review of systems noted in HPI.    Physical Exam   Vitals:   08/15/23 0105  BP: 113/80  Pulse: (!) 118  Temp: 98.1 F (36.7 C)  SpO2: 100%    CONSTITUTIONAL:  chronically ill-appearing, NAD NEURO:  Alert and oriented x 3, CN 3-12 grossly intact EYES:  eyes equal and reactive ENT/NECK:  Supple, no stridor  CARDIO:  tachycardic, regular rhythm, appears well-perfused  PULM:  No respiratory distress, CTAB GI/GU:  non-distended,  MSK/SPINE:  No gross deformities, no edema, moves all extremities  SKIN:  lesions on torso, breasts, palms, and soles   *Additional and/or pertinent findings included in MDM below  Diagnostic and Interventional Summary    EKG Interpretation Date/Time:    Ventricular Rate:    PR Interval:    QRS Duration:    QT Interval:    QTC Calculation:   R Axis:      Text Interpretation:         Labs Reviewed  WET PREP, GENITAL  RPR  HIV ANTIBODY (ROUTINE TESTING W REFLEX)  URINALYSIS, ROUTINE W REFLEX MICROSCOPIC  PREGNANCY, URINE  GC/CHLAMYDIA PROBE AMP ([place]) NOT AT [facility]    No orders to display    Medications  penicillin g benzathine (BICILLIN LA) 1200000 UNIT/2ML injection 2.4 Million Units (2.4 Million Units Intramuscular Patient Refused/Not Given 08/15/23 0610)  fluconazole (DIFLUCAN) tablet 150 mg (150 mg Oral Given 08/15/23 0524)     Procedures  /  Critical Care Procedures  ED Course and Medical  Decision Making  I have reviewed the triage vital signs, the nursing notes, and pertinent available records from the EMR.  Social Determinants Affecting Complexity of Care: Patient has no clinically significant social determinants affecting this chief complaint..   ED Course: Clinical Course as of 08/15/23 0620  Mon Aug 15, 2023  0448 Patient with diffuse rash/lesions that appear mucocutaneous.  She does have lesions on the palms and soles.  I have concern for syphilis.  Will check RPR. [RB]    Clinical Course User Index [RB] Sherel Dikes, PA-C    Medical Decision Making Patient here with diffuse rash/lesions.  She does have lesions on her palms and soles.  I do have concern for syphilis.  Will check RPR.  Will treat pro phylactically since results wont return until day shift and don't want patient to be lost to follow-up.  She also reports some vaginal itching.  Will refill her diflucan and give a dose here.  Patient refusing any additional workup.  Refusing my treatment plan.  I called and discussed the recommendations with her legal guardian.  I recommended that she follow-up with [facility].  Patient will be discharged.  Guardian understands and agrees.  Amount and/or Complexity of Data Reviewed Labs: ordered.  Risk Prescription drug management.         Consultants: No consultations were needed  in caring for this patient.   Treatment and Plan: Emergency department workup does not suggest an emergent condition requiring admission or immediate intervention beyond  what has been performed at this time. The patient is safe for discharge and has  been instructed to return immediately for worsening symptoms, change in  symptoms or any other concerns    Final Clinical Impressions(s) / ED Diagnoses     ICD-10-CM   1. Rash  R21       ED Discharge Orders     None         Discharge Instructions Discussed with and Provided to Patient:      Discharge Instructions              Sherel Dikes, PA-C 08/15/23 4098    Teddi Favors, DO 08/15/23 0725

## 2023-08-15 NOTE — ED Notes (Signed)
 Patient refused to perform self swab to collect needed labs.

## 2023-08-15 NOTE — ED Notes (Signed)
 [place] PA was able to get in contact regarding patient's AMA status.

## 2023-08-16 LAB — T.PALLIDUM AB, TOTAL: T Pallidum Abs: REACTIVE — AB

## 2023-08-23 DIAGNOSIS — E119 Type 2 diabetes mellitus without complications: Secondary | ICD-10-CM | POA: Insufficient documentation

## 2023-08-23 DIAGNOSIS — M79672 Pain in left foot: Secondary | ICD-10-CM | POA: Diagnosis present

## 2023-08-23 DIAGNOSIS — M79671 Pain in right foot: Secondary | ICD-10-CM | POA: Insufficient documentation

## 2023-08-23 DIAGNOSIS — M25571 Pain in right ankle and joints of right foot: Secondary | ICD-10-CM | POA: Insufficient documentation

## 2023-08-23 DIAGNOSIS — I10 Essential (primary) hypertension: Secondary | ICD-10-CM | POA: Insufficient documentation

## 2023-08-23 DIAGNOSIS — Z5900 Homelessness unspecified: Secondary | ICD-10-CM | POA: Diagnosis not present

## 2023-08-24 ENCOUNTER — Encounter (HOSPITAL_COMMUNITY): Payer: Self-pay | Admitting: Emergency Medicine

## 2023-08-24 ENCOUNTER — Emergency Department (HOSPITAL_COMMUNITY)

## 2023-08-24 ENCOUNTER — Other Ambulatory Visit: Payer: Self-pay

## 2023-08-24 ENCOUNTER — Emergency Department (HOSPITAL_COMMUNITY)
Admission: EM | Admit: 2023-08-24 | Discharge: 2023-08-24 | Disposition: A | Discharge status: Home / Self Care | Attending: Emergency Medicine | Admitting: Emergency Medicine

## 2023-08-24 DIAGNOSIS — M25571 Pain in right ankle and joints of right foot: Secondary | ICD-10-CM

## 2023-08-24 DIAGNOSIS — M79672 Pain in left foot: Secondary | ICD-10-CM | POA: Diagnosis not present

## 2023-08-24 MED ORDER — ACETAMINOPHEN 500 MG PO TABS: 500.0000 mg | ORAL_TABLET | Freq: Four times a day (QID) | ORAL | 0 refills | Status: AC | PRN

## 2023-08-24 MED ORDER — ACETAMINOPHEN 325 MG PO TABS
650.0000 mg | ORAL_TABLET | Freq: Once | ORAL | Status: AC
  Administered 2023-08-24: 650 mg via ORAL
  Filled 2023-08-24: qty 2

## 2023-08-24 NOTE — ED Notes (Signed)
Pt refused d/c vitals.
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
## 2023-08-24 NOTE — ED Notes (Signed)
Pt refused vitals recheck.  

## 2023-08-24 NOTE — Discharge Instructions (Addendum)
 Your x-ray is reassuring, please follow-up with your primary care doctor, return if your symptoms worsen

## 2023-08-24 NOTE — ED Provider Triage Note (Signed)
 Emergency Medicine Provider Triage Evaluation Note  Molly [name] , a 38 y.o. female  was evaluated in triage.  Pt complains of no complaint.  Patient refuses to provide me with any history.  She is seen frequently and is well known to this ED.  Review of Systems  Positive:  Negative:   Physical Exam  BP 135/83   Pulse 99   Resp 18   Ht 5\' 8"  (1.727 m)   Wt 120 kg   LMP 08/02/2023 (Approximate)   SpO2 100%   BMI 40.22 kg/m  Gen:   Awake, no distress   Resp:  Normal effort  MSK:   Moves extremities without difficulty  Other:    Medical Decision Making  Medically screening exam initiated at 12:39 AM.  Appropriate orders placed.  Molly [name] was informed that the remainder of the evaluation will be completed by another provider, this initial triage assessment does not replace that evaluation, and the importance of remaining in the ED until their evaluation is complete.     Sherel Dikes, PA-C 08/24/23 0040

## 2023-08-24 NOTE — ED Provider Notes (Signed)
 Handoff from [place]. Pt complain of bilateral foot pain and R ankle pain. Previously treated for syphilis successfully, no rash on foot. Pending imaging. If unremarkable DC w/tylenol .  Physical Exam  BP 135/83   Pulse 99   Resp 18   Ht 5\' 8"  (1.727 m)   Wt 120 kg   LMP 08/02/2023 (Approximate)   SpO2 100%   BMI 40.22 kg/m   Physical Exam Vitals and nursing note reviewed.  Constitutional:      General: She is not in acute distress.    Appearance: She is well-developed.  HENT:     Head: Normocephalic and atraumatic.  Eyes:     Conjunctiva/sclera: Conjunctivae normal.  Cardiovascular:     Rate and Rhythm: Normal rate and regular rhythm.     Heart sounds: No murmur heard. Pulmonary:     Effort: Pulmonary effort is normal. No respiratory distress.     Breath sounds: Normal breath sounds.  Abdominal:     Palpations: Abdomen is soft.     Tenderness: There is no abdominal tenderness.  Musculoskeletal:        General: No swelling.     Cervical back: Neck supple.     Comments: Right ankle, tenderness to palpation of medial malleoli, with mild edema, but no erythema, or scars or open wounds.  Positive dorsalis pedis pulse, range of motion intact.  No rash on bilateral feet  Skin:    General: Skin is warm and dry.     Capillary Refill: Capillary refill takes less than 2 seconds.  Neurological:     Mental Status: She is alert.  Psychiatric:        Mood and Affect: Mood normal.     Procedures  Procedures  ED Course / MDM    Medical Decision Making Patient is a 38 year old female, here for right ankle pain, was seen by previous provider, x-rays unremarkable, she has good pulses, no evidence of any kind wounds, or necrosis of the foot or ankle, will have her follow-up with primary care doctor prescribed Tylenol , likely secondary to overuse secondary to frequent walking from homelessness.  Amount and/or Complexity of Data Reviewed Radiology: ordered.  Risk OTC  drugs.         Timmy Forbes, [place] 08/24/23 1609    Rafael Bun A, DO 08/29/23 1317

## 2023-08-24 NOTE — ED Notes (Signed)
 Pt reassured and given coffee per her request. Pt no longer screaming.

## 2023-08-24 NOTE — ED Triage Notes (Signed)
 Pt bib ems with c/o bilateral leg and foot pain x 1 week.

## 2023-08-24 NOTE — ED Notes (Signed)
 Pt urinated on herself, the wheelchair and, the floor. Refused to be cleaned, change into paper scrubs, or let us  clean the wheelchair.

## 2023-08-24 NOTE — ED Notes (Signed)
 Pt urinated again in lobby on herself and wheelchair, paper scrubs offered but refused.
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
## 2023-08-26 ENCOUNTER — Other Ambulatory Visit: Payer: Self-pay

## 2023-08-26 ENCOUNTER — Emergency Department (HOSPITAL_COMMUNITY)
Admission: EM | Admit: 2023-08-26 | Discharge: 2023-08-26 | Discharge status: Against Medical Advice | Attending: Emergency Medicine | Admitting: Emergency Medicine

## 2023-08-26 ENCOUNTER — Encounter (HOSPITAL_COMMUNITY): Payer: Self-pay | Admitting: Emergency Medicine

## 2023-08-26 DIAGNOSIS — Z5321 Procedure and treatment not carried out due to patient leaving prior to being seen by health care provider: Secondary | ICD-10-CM | POA: Diagnosis not present

## 2023-08-26 DIAGNOSIS — T7421XA Adult sexual abuse, confirmed, initial encounter: Secondary | ICD-10-CM | POA: Diagnosis present

## 2023-08-26 DIAGNOSIS — R21 Rash and other nonspecific skin eruption: Secondary | ICD-10-CM | POA: Diagnosis not present

## 2023-08-26 NOTE — ED Notes (Signed)
 PA Upstill evaluated in triage
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
## 2023-08-26 NOTE — ED Notes (Signed)
 Pt is asleep
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
## 2023-08-26 NOTE — Consult Note (Signed)
 1055 I spoke with Ms. [name], patient's legal guardian, about patient complaint. I advised her on SANE services and 5 day window of opportunity for SANE services. Ms. Pamalee Body verbalized understanding. Reports that she will discuss this with patient's ACT team for further planning. 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 She asked if patient followed up. I advised that I did not see a note about follow up.

## 2023-09-06 ENCOUNTER — Encounter (HOSPITAL_COMMUNITY): Payer: Self-pay | Admitting: Emergency Medicine

## 2023-09-06 ENCOUNTER — Emergency Department (HOSPITAL_COMMUNITY)
Admission: EM | Admit: 2023-09-06 | Discharge: 2023-09-07 | Disposition: A | Discharge status: Home / Self Care | Attending: Emergency Medicine | Admitting: Emergency Medicine

## 2023-09-06 ENCOUNTER — Other Ambulatory Visit: Payer: Self-pay

## 2023-09-06 DIAGNOSIS — Z3202 Encounter for pregnancy test, result negative: Secondary | ICD-10-CM | POA: Diagnosis not present

## 2023-09-06 DIAGNOSIS — Z3200 Encounter for pregnancy test, result unknown: Secondary | ICD-10-CM | POA: Diagnosis present

## 2023-09-06 LAB — PREGNANCY, URINE: Preg Test, Ur: NEGATIVE
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
## 2023-09-06 NOTE — ED Provider Triage Note (Signed)
 Emergency Medicine Provider Triage Evaluation Note  Molly [name] , a 38 y.o. female  was evaluated in triage.  Pt requests pregnancy test. Also wants a bath. Complains of itching.   Review of Systems  Positive:  Negative:   Physical Exam  BP (!) 126/97   Pulse (!) 127   Temp 98.5 F (36.9 C) (Oral)   Resp 17   Wt 120 kg   LMP 08/02/2023 (Approximate)   SpO2 96%   BMI 40.22 kg/m  Gen:   Awake, no distress   Resp:  Normal effort  MSK:   Moves extremities without difficulty  Other:    Medical Decision Making  Medically screening exam initiated at 7:25 PM.  Appropriate orders placed.  Wealthy Charlemagne was informed that the remainder of the evaluation will be completed by another provider, this initial triage assessment does not replace that evaluation, and the importance of remaining in the ED until their evaluation is complete.     Sherra Dk, PA-C 09/06/23 1926
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
## 2023-09-07 NOTE — ED Notes (Signed)
 Pt urinated on herself and wheelchair. Was asked if she wanted to be wheeled into the bathroom to clean up and be changed, patient said no. Area around wheelchair cleaned.

## 2023-10-15 ENCOUNTER — Encounter (HOSPITAL_COMMUNITY): Payer: Self-pay

## 2023-10-15 ENCOUNTER — Emergency Department (HOSPITAL_COMMUNITY)
Admission: EM | Admit: 2023-10-15 | Discharge: 2023-10-16 | Disposition: A | Discharge status: Home / Self Care | Attending: Emergency Medicine | Admitting: Emergency Medicine

## 2023-10-15 ENCOUNTER — Other Ambulatory Visit: Payer: Self-pay

## 2023-10-15 ENCOUNTER — Emergency Department (HOSPITAL_COMMUNITY)
Admission: EM | Admit: 2023-10-15 | Discharge: 2023-10-15 | Disposition: A | Discharge status: Home / Self Care | Attending: Emergency Medicine | Admitting: Emergency Medicine

## 2023-10-15 DIAGNOSIS — M79672 Pain in left foot: Secondary | ICD-10-CM | POA: Diagnosis present

## 2023-10-15 DIAGNOSIS — G8929 Other chronic pain: Secondary | ICD-10-CM | POA: Diagnosis not present

## 2023-10-15 DIAGNOSIS — E119 Type 2 diabetes mellitus without complications: Secondary | ICD-10-CM | POA: Insufficient documentation

## 2023-10-15 DIAGNOSIS — R600 Localized edema: Secondary | ICD-10-CM | POA: Insufficient documentation

## 2023-10-15 DIAGNOSIS — Z5900 Homelessness unspecified: Secondary | ICD-10-CM | POA: Insufficient documentation

## 2023-10-15 DIAGNOSIS — W010XXA Fall on same level from slipping, tripping and stumbling without subsequent striking against object, initial encounter: Secondary | ICD-10-CM | POA: Insufficient documentation

## 2023-10-15 DIAGNOSIS — M79671 Pain in right foot: Secondary | ICD-10-CM | POA: Insufficient documentation

## 2023-10-15 MED ORDER — ACETAMINOPHEN 500 MG PO TABS
1000.0000 mg | ORAL_TABLET | Freq: Once | ORAL | Status: AC
  Administered 2023-10-15: 1000 mg via ORAL
  Filled 2023-10-15: qty 2

## 2023-10-15 NOTE — Discharge Instructions (Signed)
 You were seen in the ER today for evaluation of your feet pain from your shoes. We have given you some slides to wear. Please make sure to follow up with your primary care provider. Try not to wear two pairs of socks as this can cut some of your circulation and cause pain. If you have any concerns, new or worsening symptoms, please return to the nearest ER for re-evaluation.

## 2023-10-15 NOTE — ED Notes (Signed)
Pt offered wheelchair, refused.

## 2023-10-15 NOTE — ED Notes (Signed)
 Legal guardian attempted multiple times by RN to notify on patient's status but unable to reach/ discharge plan .

## 2023-10-15 NOTE — ED Triage Notes (Signed)
 Pt reports she tripped and fell tonight. She is not sure what time this happened. She reports pain to both of her feet. Sometimes they both hurt, sometimes one hurts more than the other one. She is ambulatory with independent steady gait.

## 2023-10-15 NOTE — ED Triage Notes (Signed)
 Bystanders states pt was falling in road, c/o bilateral foot pain. VSS.

## 2023-10-15 NOTE — ED Provider Notes (Signed)
  [place] EMERGENCY DEPARTMENT AT [facility] Provider Note   CSN: [id number] Arrival date & time: 10/15/23  1825     Patient presents with: Leg Pain   Molly [name] is a 38 y.o. female.  {Add pertinent medical, surgical, social history, OB history to HPI:32947}  Leg Pain      Prior to Admission medications   Medication Sig Start Date End Date Taking? Authorizing Provider  acetaminophen  (TYLENOL ) 500 MG tablet Take 1 tablet (500 mg total) by mouth every 6 (six) hours as needed. 08/24/23   Small, Brooke L, PA  albuterol  (VENTOLIN  HFA) 108 (90 Base) MCG/ACT inhaler Inhale 1-2 puffs into the lungs every 4 (four) hours as needed for wheezing or shortness of breath.    [provider]  cephALEXin  (KEFLEX ) 500 MG capsule Take 1 capsule (500 mg total) by mouth 4 (four) times daily. 04/23/23   Schutt, Coni Deep, PA-C  divalproex  (DEPAKOTE  ER) 500 MG 24 hr tablet Take 1 tablet (500 mg total) by mouth daily. Patient taking differently: Take 1,000 mg by mouth in the morning. 04/02/23   Edson Graces, MD  fluPHENAZine  (PROLIXIN ) 10 MG tablet Take 10 mg by mouth daily. 05/25/22   [provider]  fluPHENAZine  decanoate (PROLIXIN ) 25 MG/ML injection Inject 50 mg into the muscle every 21 ( twenty-one) days.    [provider]  OLANZapine  (ZYPREXA ) 10 MG tablet Take 1 tablet (10 mg total) by mouth at bedtime. Patient not taking: Reported on 05/01/2023 04/02/23   Edson Graces, MD    Allergies: Abilify [aripiprazole]    Review of Systems  Updated Vital Signs BP 114/73 (BP Location: Left Arm)   Pulse (!) 101   Temp 97.6 F (36.4 C) (Temporal)   Resp 18   SpO2 99%   Physical Exam  (all labs ordered are listed, but only abnormal results are displayed) Labs Reviewed - No data to display  EKG: None  Radiology: No results found.  {Document cardiac monitor, telemetry assessment procedure when appropriate:32947} Procedures   Medications  Ordered in the ED  acetaminophen  (TYLENOL ) tablet 1,000 mg (has no administration in time range)      {Click here for ABCD2, HEART and other calculators REFRESH Note before signing:1}                              Medical Decision Making Risk OTC drugs.   ***  {Document critical care time when appropriate  Document review of labs and clinical decision tools ie CHADS2VASC2, etc  Document your independent review of radiology images and any outside records  Document your discussion with family members, caretakers and with consultants  Document social determinants of health affecting pt's care  Document your decision making why or why not admission, treatments were needed:32947:::1}   Final diagnoses:  None    ED Discharge Orders     None

## 2023-10-16 MED ORDER — ACETAMINOPHEN 500 MG PO TABS
1000.0000 mg | ORAL_TABLET | Freq: Once | ORAL | Status: AC
  Administered 2023-10-16: 1000 mg via ORAL
  Filled 2023-10-16: qty 2

## 2023-10-16 NOTE — ED Notes (Signed)
 Patient dc'ed, drink given as requested. Follow up reviewed with patient. Patient has been told several time that she has been dc'ed at this time

## 2023-10-16 NOTE — ED Provider Notes (Signed)
 [place] EMERGENCY DEPARTMENT AT [facility] Provider Note   CSN: [id number] Arrival date & time: 10/15/23  2100     Patient presents with: Molly [name]   Ashawna Hanback is a 38 y.o. female.   The history is provided by the patient and medical records.  Fall   38 year old female with history of aggressive behavior, chronic hallucinations, schizoaffective disorder, presenting to the ED for reported fall.  She does not give me any details about a fall, rather states both of her feet are hurting.  Reports she has been walking a lot.  Feels like feet may be swollen.  Patient well-known to ED for same.  22 visits in the past 6 months.  She has been seen in the past 24 hours for same.  Prior to Admission medications   Medication Sig Start Date End Date Taking? Authorizing Provider  acetaminophen  (TYLENOL ) 500 MG tablet Take 1 tablet (500 mg total) by mouth every 6 (six) hours as needed. 08/24/23   Small, Brooke L, PA  albuterol  (VENTOLIN  HFA) 108 (90 Base) MCG/ACT inhaler Inhale 1-2 puffs into the lungs every 4 (four) hours as needed for wheezing or shortness of breath.    [provider]  cephALEXin  (KEFLEX ) 500 MG capsule Take 1 capsule (500 mg total) by mouth 4 (four) times daily. 04/23/23   Schutt, Coni Deep, PA-C  divalproex  (DEPAKOTE  ER) 500 MG 24 hr tablet Take 1 tablet (500 mg total) by mouth daily. Patient taking differently: Take 1,000 mg by mouth in the morning. 04/02/23   Edson Graces, MD  fluPHENAZine  (PROLIXIN ) 10 MG tablet Take 10 mg by mouth daily. 05/25/22   [provider]  fluPHENAZine  decanoate (PROLIXIN ) 25 MG/ML injection Inject 50 mg into the muscle every 21 ( twenty-one) days.    [provider]  OLANZapine  (ZYPREXA ) 10 MG tablet Take 1 tablet (10 mg total) by mouth at bedtime. Patient not taking: Reported on 05/01/2023 04/02/23   Edson Graces, MD    Allergies: Abilify [aripiprazole]    Review of Systems  Musculoskeletal:   Positive for arthralgias.  All other systems reviewed and are negative.   Updated Vital Signs BP 105/72 (BP Location: Left Arm)   Pulse (!) 104   Temp 98.5 F (36.9 C)   Resp 20   LMP 10/15/2023 (Exact Date)   SpO2 100%   Physical Exam Vitals and nursing note reviewed.  Constitutional:      Appearance: She is well-developed.  HENT:     Head: Normocephalic and atraumatic.   Eyes:     Conjunctiva/sclera: Conjunctivae normal.     Pupils: Pupils are equal, round, and reactive to light.    Cardiovascular:     Rate and Rhythm: Normal rate and regular rhythm.     Heart sounds: Normal heart sounds.  Pulmonary:     Effort: Pulmonary effort is normal.     Breath sounds: Normal breath sounds.  Abdominal:     General: Bowel sounds are normal.   Musculoskeletal:        General: Normal range of motion.     Cervical back: Normal range of motion.     Comments: Appears to some dependent edema at the ankles, non-pitting, able to walk and move around with out difficulty, no deformities of the feet   Skin:    General: Skin is warm and dry.   Neurological:     Mental Status: She is alert and oriented to person, place, and time.     (  all labs ordered are listed, but only abnormal results are displayed) Labs Reviewed - No data to display  EKG: None  Radiology: No results found.   Procedures   Medications Ordered in the ED  acetaminophen  (TYLENOL ) tablet 1,000 mg (has no administration in time range)                                    Medical Decision Making Risk OTC drugs.   38 y.o. F here for bilateral foot pain.  Reports fall, however no history given supporting a recent fall.  Well known to the ED for same.  Dependent edema of the ankles, no deformities on exam.  Remains ambulatory.  No apparently emergent condition.  Requested tylenol  for pain which was given.  Stable for discharge.   Final diagnoses:  Homelessness    ED Discharge Orders     None           Nathan Bake 10/16/23 0135    Kelsey Patricia, MD 10/16/23 [phone]

## 2023-10-16 NOTE — ED Notes (Signed)
 Security at bedside to assist patient to the lobby

## 2023-10-29 ENCOUNTER — Emergency Department (HOSPITAL_COMMUNITY)
Admission: EM | Admit: 2023-10-29 | Discharge: 2023-10-29 | Disposition: A | Discharge status: Home / Self Care | Attending: Emergency Medicine | Admitting: Emergency Medicine

## 2023-10-29 ENCOUNTER — Other Ambulatory Visit: Payer: Self-pay

## 2023-10-29 ENCOUNTER — Encounter (HOSPITAL_COMMUNITY): Payer: Self-pay | Admitting: Emergency Medicine

## 2023-10-29 ENCOUNTER — Emergency Department (HOSPITAL_COMMUNITY)

## 2023-10-29 ENCOUNTER — Encounter (HOSPITAL_COMMUNITY): Payer: Self-pay

## 2023-10-29 DIAGNOSIS — M25572 Pain in left ankle and joints of left foot: Secondary | ICD-10-CM

## 2023-10-29 DIAGNOSIS — Y92481 Parking lot as the place of occurrence of the external cause: Secondary | ICD-10-CM | POA: Diagnosis not present

## 2023-10-29 DIAGNOSIS — Z5900 Homelessness unspecified: Secondary | ICD-10-CM | POA: Insufficient documentation

## 2023-10-29 DIAGNOSIS — X509XXA Other and unspecified overexertion or strenuous movements or postures, initial encounter: Secondary | ICD-10-CM | POA: Insufficient documentation

## 2023-10-29 DIAGNOSIS — E119 Type 2 diabetes mellitus without complications: Secondary | ICD-10-CM | POA: Diagnosis not present

## 2023-10-29 NOTE — ED Provider Notes (Signed)
 [place] EMERGENCY DEPARTMENT AT [facility] Provider Note   CSN: [id number] Arrival date & time: 10/29/23  1552     Patient presents with: No chief complaint on file.   Molly [name] is a 38 y.o. female with past medical history of diabetes, eczema, schizoaffective disorder, polysubstance abuse, well-known to ED presents emergency department for evaluation of left ankle pain.  She is unable to give me specifics on how she hurt her left ankle other than I jumped and hurt it. She is asking for crutches   HPI      Prior to Admission medications   Medication Sig Start Date End Date Taking? Authorizing Provider  acetaminophen  (TYLENOL ) 500 MG tablet Take 1 tablet (500 mg total) by mouth every 6 (six) hours as needed. 08/24/23   Small, Brooke L, PA  albuterol  (VENTOLIN  HFA) 108 (90 Base) MCG/ACT inhaler Inhale 1-2 puffs into the lungs every 4 (four) hours as needed for wheezing or shortness of breath.    [provider]  cephALEXin  (KEFLEX ) 500 MG capsule Take 1 capsule (500 mg total) by mouth 4 (four) times daily. 04/23/23   Schutt, Marsa HERO, PA-C  divalproex  (DEPAKOTE  ER) 500 MG 24 hr tablet Take 1 tablet (500 mg total) by mouth daily. Patient taking differently: Take 1,000 mg by mouth in the morning. 04/02/23   Theadore Ozell HERO, MD  fluPHENAZine  (PROLIXIN ) 10 MG tablet Take 10 mg by mouth daily. 05/25/22   [provider]  fluPHENAZine  decanoate (PROLIXIN ) 25 MG/ML injection Inject 50 mg into the muscle every 21 ( twenty-one) days.    [provider]  OLANZapine  (ZYPREXA ) 10 MG tablet Take 1 tablet (10 mg total) by mouth at bedtime. Patient not taking: Reported on 05/01/2023 04/02/23   Theadore Ozell HERO, MD    Allergies: Abilify [aripiprazole]    Review of Systems  Musculoskeletal:  Positive for arthralgias.    Updated Vital Signs BP (!) 125/94 (BP Location: Left Arm)   Pulse 92   Temp 97.7 F (36.5 C) (Oral)   Resp 16   LMP 10/15/2023  (Exact Date)   SpO2 100%   Physical Exam Vitals and nursing note reviewed.  Constitutional:      General: She is not in acute distress.    Appearance: Normal appearance.  HENT:     Head: Normocephalic and atraumatic.   Eyes:     Conjunctiva/sclera: Conjunctivae normal.    Cardiovascular:     Rate and Rhythm: Normal rate.  Pulmonary:     Effort: Pulmonary effort is normal. No respiratory distress.  Feet:     Comments: No obvious swelling, erythema to ankles bilaterally. It appears she moves them equally and without difficulty. No obvious deformity. Extremity appears well perfused per visualization. She refuses to let me examine her ankles, pulses, etc beyond visual inspection. She will not move them when asked to do so. She continues to pull blanket back over her ankles and head refusing further evaluation  Skin:    Coloration: Skin is not jaundiced or pale.   Neurological:     Mental Status: She is alert. Mental status is at baseline.   Psychiatric:        Behavior: Behavior is uncooperative and agitated.     (all labs ordered are listed, but only abnormal results are displayed) Labs Reviewed - No data to display  EKG: None  Radiology: DG Ankle Complete Left Result Date: 10/29/2023 CLINICAL DATA:  Pain EXAM: LEFT ANKLE COMPLETE - 3+ VIEW  COMPARISON:  07/28/2023 FINDINGS: Swelling about the ankle greatest over the medial malleolus. No acute fracture or dislocation. Findings are similar to 07/28/2023. IMPRESSION: Swelling about the ankle greatest over the medial malleolus. No acute fracture or dislocation. Electronically Signed   By: Norman Gatlin M.D.   On: 10/29/2023 19:55     Medications Ordered in the ED - No data to display                                  Medical Decision Making Amount and/or Complexity of Data Reviewed Radiology: ordered.     Patient presents to the ED for concern of left ankle pain, this involves an extensive number of treatment  options, and is a complaint that carries with it a high risk of complications and morbidity.  The differential diagnosis includes fracture, contusion, dislocation, sprain, vascular injury   Co morbidities that complicate the patient evaluation  See HPI   Additional history obtained:  Additional history obtained from Nursing and Outside Medical Records   External records from outside source obtained and reviewed including triage RN note, EDP note from earlier this morning    Imaging Studies ordered:  I ordered imaging studies including left ankle xr  I independently visualized and interpreted imaging which showed swelling of ankle most notably at medial malleolus I agree with the radiologist interpretation    Problem List / ED Course:  Left ankle pain Noncontributory during physical exam so it is limited. She will not allow me to touch her ankle nor move it for me on command to visualize it. However, does appear that she is moving all extremities without difficulty.  She has no other complaints other than left ankle pain and being agitated about answering questions.  Although my exam is limited, have low suspicion for septic joint as it is not grossly swollen.  No visual signs of erythema.  Low suspicion for vascular injury as the extremity appears perfused and she seems to be moving it on her own without difficulty.  She denies numbness, paresthesia to legs bilaterally. She was seen this morning at 0600 for similar complaints however would not let provider examine nor obtain vitals nor obtain x-ray imaging.  Per their note, EMS reports that she was ambulatory on scene and found with multiple EtOH bottles around her. Currently, she is requesting crutches and to be left alone Initially, she refused x-ray and wanted to be left alone.  However, x-ray returned and she agreed to have x-rays performed.  X-ray notable for mild generalized swelling most notably over medial malleolus.  Will  provide cam boot, crutches for stability, pain.  I will provide orthopedic follow-up for her in case she needs advanced imaging to rule out ligamentous injury She may use Tylenol , ibuprofen  intermittently every hours for pain.  I explained symptomatic treatment at home to include RICE    Reevaluation:  After the interventions noted above, I reevaluated the patient and found that they have :stayed the same   Social Determinants of Health:  homeless   Dispostion:  After consideration of the diagnostic results and the patients response to treatment, I feel that the patent would benefit from outpatient management with symptomatic care.  Discussed ED workup, disposition, return to ED precautions with patient who expresses understanding agrees with plan.  All questions answered to their satisfaction.  They are agreeable to plan.  Discharge instructions provided on paperwork   Final  diagnoses:  Acute left ankle pain    ED Discharge Orders     None          Minnie Tinnie BRAVO, PA 10/29/23 2009    Patsey Lot, MD 10/29/23 2316

## 2023-10-29 NOTE — ED Notes (Addendum)
..  The patient is A&OX4, wheeled out of ED via wheelchair, NAD. Pt verbalized understanding of d/c instructions and follow up care. Pt refused the cam boot. She requested her left ankle to be wrapped with ace wrap so this RN did wrap her ankle for her.

## 2023-10-29 NOTE — ED Notes (Signed)
 Pt is refusing to answer when asked if she wants crutches or the boot.

## 2023-10-29 NOTE — Discharge Instructions (Addendum)
 Thank you for letting us  evaluate you today.  Your ankle x-ray showed some mild swelling to the middle part of your left ankle.  I provided you with a cam boot and crutches for pain, stability.  Have also provided you with orthopedic follow-up for you to follow-up with them in case advanced imaging is required.  Please take Tylenol  and ibuprofen  as needed intermittently every 8 hours for pain, swelling.  You may also decrease weightbearing, elevate, ice it to decrease swelling and pain.  Return to emergency department if you experience significant worsening of pain, warmth, redness around area, new injury

## 2023-10-29 NOTE — ED Triage Notes (Signed)
 Pt brought in by EMS from side of the road. Pt refused to talk to EMS. Left ankle pain that is now not hurting. Pt was seen earlier today in the ED and had workup done then was escorted out of the ED by security. Chart marked for merge that shows visit and results from today.

## 2023-10-29 NOTE — ED Provider Notes (Cosign Needed Addendum)
 [name] EMERGENCY DEPARTMENT AT [facility] Provider Note   CSN: [id number] Arrival date & time: 10/29/23  9387     Patient presents with: No chief complaint on file.   Molly [name] is a 38 y.o. female otherwise healthy reports that she was smoking some unknown drugs this evening when she rolled her ankle off a curb.  She complains of left ankle pain here in the ED.  Denies falling and hitting her head.  Patient provides limited history.  Per EMS empty EtOH bottles were present at the parking lot from where she was found.   HPI     Prior to Admission medications   Not on File    Allergies: Patient has no allergy information on record.    Review of Systems  Musculoskeletal:  Positive for arthralgias.    Updated Vital Signs LMP  (LMP Unknown)   Physical Exam Vitals and nursing note reviewed.  Constitutional:      General: She is not in acute distress.    Appearance: She is well-developed.  HENT:     Head: Normocephalic and atraumatic.   Eyes:     Conjunctiva/sclera: Conjunctivae normal.    Cardiovascular:     Rate and Rhythm: Normal rate and regular rhythm.     Heart sounds: No murmur heard. Pulmonary:     Effort: Pulmonary effort is normal. No respiratory distress.     Breath sounds: Normal breath sounds.   Musculoskeletal:     Cervical back: Neck supple.     Comments: Moves extremities spontaneously, no significant swelling or ecchymosis.  DP pulses 2+, does not tolerate any further exam   Skin:    General: Skin is warm and dry.     Capillary Refill: Capillary refill takes less than 2 seconds.   Neurological:     Mental Status: She is alert.   Psychiatric:        Mood and Affect: Mood normal.     (all labs ordered are listed, but only abnormal results are displayed) Labs Reviewed - No data to display  EKG: None  Radiology: No results found.   Procedures   Medications Ordered in the ED - No data to display                                   Medical Decision Making Amount and/or Complexity of Data Reviewed Radiology: ordered.   This patient presents to the ED with chief complaint(s) of ankle pain.  The complaint involves an extensive differential diagnosis and also carries with it a high risk of complications and morbidity.   Pertinent past medical history as listed in HPI  The differential diagnosis includes  Fracture, dislocation, sprain, wound Additional history obtained: Records reviewed Care Everywhere/External Records  Assessment and management:   Hemodynamically stable, nontoxic-appearing patient presenting with complaints of left ankle pain.  Patient states she was smoking some unknown drugs this evening and rolled her ankle.  She denies falling and hitting her head and is not complaining of pain anywhere else.  On exam patient is insistent on sleeping.  She does have bilateral conjunctival injection.  She is nonparticipatory in exam.  She does appear to move all her extremities spontaneously.  She has no significant swelling or ecchymosis over the affected extremity no wounds appreciated.  Pulses are 2+.  Does not tolerate any further examination.  Will obtain plain films as I am unable  to perform adequate exam.  Patient is denying x-rays and vitals.  She was ambulatory per EMS.  Attempted to p.o. challenge.  She is denying water .  She is requesting to leave.  She has been monitored for several hours now and has not participated in any examination.  At this point she appears stable for discharge.  Chart review demonstrates numerous visits with she does have some lateral similar presentations requiring security to escort her out.  Independent ECG interpretation:  none  Independent labs interpretation:  The following labs were independently interpreted:  none  Independent visualization and interpretation of imaging: I independently visualized the following imaging with scope of interpretation limited to  determining acute life threatening conditions related to emergency care: Ankle x-ray   Consultations obtained:   none  Disposition:   Patient will be discharged home. The patient has been appropriately medically screened and/or stabilized in the ED. I have low suspicion for any other emergent medical condition which would require further screening, evaluation or treatment in the ED or require inpatient management. At time of discharge the patient is hemodynamically stable and in no acute distress. I have discussed work-up results and diagnosis with patient and answered all questions. Patient is agreeable with discharge plan. We discussed strict return precautions for returning to the emergency department and they verbalized understanding.     Social Determinants of Health:   none  This note was dictated with voice recognition software.  Despite best efforts at proofreading, errors may have occurred which can change the documentation meaning.       Final diagnoses:  Acute left ankle pain    ED Discharge Orders     None          Donnajean Lynwood DEL, PA-C 10/29/23 0929    Donnajean Lynwood DEL, PA-C 10/29/23 1039    Theadore Ozell HERO, MD 11/02/23 [phone]

## 2023-10-29 NOTE — ED Triage Notes (Addendum)
 Pt in from [employer] parking lot via [facility], c/o L ankle pain. Ambulatory per EMS. EMS also reports ETOH use, empty bottles present in restaurant parking lot

## 2023-10-29 NOTE — ED Notes (Signed)
 PA-C adv. This patient can continue to be discharged. Pt remains uncooperative and refuses any assistance from providers. Security requested for assistance.

## 2023-10-29 NOTE — ED Notes (Signed)
 Pt transported to [facility]

## 2023-10-29 NOTE — Discharge Instructions (Addendum)
 You were evaluated in the emergency room for ankle pain.  You may call the number on the sheet to follow-up with orthopedics if your symptoms persist.

## 2023-10-29 NOTE — ED Notes (Signed)
 Offered patient fluids and she refused. Asked if she could leave. PA-C notified.

## 2023-10-29 NOTE — ED Notes (Signed)
 Pt refused X-Ray and refused V/S assessment.

## 2023-10-29 NOTE — ED Notes (Signed)
 Pt escorted out by security.

## 2023-10-29 NOTE — ED Notes (Signed)
 Pt placed up for discharge after asking to leave. Pt very uncooperative and refusing care but does not want to leave. Upon providing her d/c paperwork, pt folded her papers and handed them back to me and advised she can't get up from the stretcher and walk. Pt refusing to leave. Provider notified.

## 2023-10-31 ENCOUNTER — Encounter (HOSPITAL_COMMUNITY): Payer: Self-pay

## 2023-11-08 ENCOUNTER — Emergency Department (HOSPITAL_COMMUNITY)

## 2023-11-08 ENCOUNTER — Telehealth: Payer: Self-pay | Admitting: *Deleted

## 2023-11-08 ENCOUNTER — Emergency Department (HOSPITAL_COMMUNITY)
Admission: EM | Admit: 2023-11-08 | Discharge: 2023-11-08 | Disposition: A | Discharge status: Home / Self Care | Attending: Emergency Medicine | Admitting: Emergency Medicine

## 2023-11-08 ENCOUNTER — Other Ambulatory Visit: Payer: Self-pay

## 2023-11-08 DIAGNOSIS — M25572 Pain in left ankle and joints of left foot: Secondary | ICD-10-CM | POA: Insufficient documentation

## 2023-11-08 NOTE — Discharge Instructions (Signed)
 Please follow-up with your primary care doctor for further evaluation.  You may return to the emergency department for any worsening symptoms.

## 2023-11-08 NOTE — Telephone Encounter (Signed)
 RNCM received call from pt legal guardian, [name], SW confirming receipt of my messaged left on desk voicemail.  [name] asked that pt record be updated to only have her work cell number as contact so that she may be reached more timely when pt arrives and is ready for discharge,  RNCM updated chart as requested.

## 2023-11-08 NOTE — ED Notes (Signed)
 Pt resting with eyes closed; respirations spontaneous, even, unlabored

## 2023-11-08 NOTE — Discharge Planning (Signed)
 RNCM contacted Katheen Morley, SW legal guardian to inform her of pt discharge order from ER.  [name] inquired about reason for ER visit and diagnosis.  RNCM relayed chief complaint as ankle pain and that pt refused x-ray to confirm/deny eriloogy.  [name] inquired about any labs taken as she has noticed pt weight dropping significantly.  RNCM verified with team that no labs where taken as pt refused EKG and x-ray.  Pt left prior to [facility] visit today.   Arletta Lumadue J. Debarah, BSN, RN, NCM  Transitions of Care  Nurse Case Manager  [facility] Emergency Departments  Operative Services  [phone]

## 2023-11-08 NOTE — ED Notes (Signed)
 Patient verbalizes understanding of discharge instructions. Opportunity for questioning and answers were provided. Pt discharged from ED.

## 2023-11-08 NOTE — ED Provider Notes (Signed)
 [place] EMERGENCY DEPARTMENT AT [facility] Provider Note   CSN: [id number] Arrival date & time: 11/08/23  9081     Patient presents with: No chief complaint on file.   Molly [name] is a 38 y.o. female patient who presents to the emerged part today for further evaluation of left ankle swelling.  Been there for 3 days.  She states that it is popping as well.  She states that somebody stepped on the ankle.  Denies any other injury.   HPI     Prior to Admission medications   Medication Sig Start Date End Date Taking? Authorizing Provider  acetaminophen  (TYLENOL ) 500 MG tablet Take 1 tablet (500 mg total) by mouth every 6 (six) hours as needed. 08/24/23   Small, Brooke L, PA  albuterol  (VENTOLIN  HFA) 108 (90 Base) MCG/ACT inhaler Inhale 1-2 puffs into the lungs every 4 (four) hours as needed for wheezing or shortness of breath.    [provider]  cephALEXin  (KEFLEX ) 500 MG capsule Take 1 capsule (500 mg total) by mouth 4 (four) times daily. 04/23/23   Schutt, Marsa HERO, PA-C  divalproex  (DEPAKOTE  ER) 500 MG 24 hr tablet Take 1 tablet (500 mg total) by mouth daily. Patient taking differently: Take 1,000 mg by mouth in the morning. 04/02/23   Theadore Ozell HERO, MD  fluPHENAZine  (PROLIXIN ) 10 MG tablet Take 10 mg by mouth daily. 05/25/22   [provider]  fluPHENAZine  decanoate (PROLIXIN ) 25 MG/ML injection Inject 50 mg into the muscle every 21 ( twenty-one) days.    [provider]  OLANZapine  (ZYPREXA ) 10 MG tablet Take 1 tablet (10 mg total) by mouth at bedtime. Patient not taking: Reported on 05/01/2023 04/02/23   Theadore Ozell HERO, MD    Allergies: Abilify [aripiprazole]    Review of Systems  All other systems reviewed and are negative.   Updated Vital Signs BP (!) 122/91   Pulse 100   Temp 98 F (36.7 C)   Resp 16   LMP  (LMP Unknown)   SpO2 100%   Physical Exam Vitals and nursing note reviewed.  Constitutional:      Appearance:  Normal appearance.  HENT:     Head: Normocephalic and atraumatic.  Eyes:     General:        Right eye: No discharge.        Left eye: No discharge.     Conjunctiva/sclera: Conjunctivae normal.  Pulmonary:     Effort: Pulmonary effort is normal.  Musculoskeletal:     Comments: Mild swelling to the medial malleolus of the left ankle.  Skin:    General: Skin is warm and dry.     Findings: No rash.  Neurological:     General: No focal deficit present.     Mental Status: She is alert.  Psychiatric:        Mood and Affect: Mood normal.        Behavior: Behavior normal.     (all labs ordered are listed, but only abnormal results are displayed) Labs Reviewed - No data to display  EKG: None  Radiology: No results found.   Procedures   Medications Ordered in the ED - No data to display  Clinical Course as of 11/08/23 1255  Tue Nov 08, 2023  1253 Patient refused imaging.  She is overall in no acute distress.  She is well-known to the emergency department.  Will plan to discharge home. [CF]    Clinical Course User  Index [CF] Theotis Cameron HERO, PA-C    Medical Decision Making Molly [name] is a 38 y.o. female patient who presents to the emergency department today for further evaluation of left-sided ankle pain.  Patient refused imaging.  She is well-known to the department and always comes in with multiple complaints.  Again I have a low suspicion at this time for any vascular compromise in the left ankle.  Low suspicion for any fracture.  Her ankle is nontender to palpation. Strict return precautions discussed. She is safe for discharge.    Amount and/or Complexity of Data Reviewed Radiology: ordered.     Final diagnoses:  Acute left ankle pain    ED Discharge Orders     None          Theotis Cameron [place], [place] 11/08/23 1255    Lenor Hollering, MD 11/09/23 1140

## 2023-11-08 NOTE — ED Notes (Addendum)
 Called pt's legal guardian Tilden Woolard); no answer; left voicemail

## 2023-11-08 NOTE — ED Provider Triage Note (Signed)
 Emergency Medicine Provider Triage Evaluation Note  Molly [name] , a 38 y.o. female  was evaluated in triage.  Pt complains of lying on sidewalk and refusing to move.  Review of Systems  Positive: No injuries, hungry, thirsty Negative: Denies injuries  Physical Exam  LMP  (LMP Unknown)  Gen:   Awake, no distress, unkempt Resp:  Normal effort  MSK:   Moves extremities without difficulty  Other:  Awake alert  Medical Decision Making  Medically screening exam initiated at 9:25 AM.  Appropriate orders placed.  [name] was informed that the remainder of the evaluation will be completed by another provider, this initial triage assessment does not replace that evaluation, and the importance of remaining in the ED until their evaluation is complete.  Will place labs and obtained psych consult   Levander Houston, MD 11/08/23 [phone]

## 2023-11-08 NOTE — ED Notes (Addendum)
 Pt refusing x ray; PA fleming notified

## 2023-11-08 NOTE — ED Triage Notes (Signed)
 Patient arrived by [facility] from sidewalk outside business. Patient refuses to talk to ems and hospital staff. Patient has no injuries and does say hungry and thirsty. Alert to baseline.
